# Patient Record
Sex: Male | Born: 1945
Health system: Southern US, Community
[De-identification: ages and names within clinical notes are randomized; demographics above are authoritative.]

## PROBLEM LIST (undated history)

## (undated) ENCOUNTER — Emergency Department (HOSPITAL_COMMUNITY): Discharge status: Against Medical Advice | Payer: BC Managed Care – PPO

## (undated) DIAGNOSIS — F32A Depression, unspecified: Secondary | ICD-10-CM

## (undated) DIAGNOSIS — E1165 Type 2 diabetes mellitus with hyperglycemia: Secondary | ICD-10-CM

## (undated) DIAGNOSIS — E785 Hyperlipidemia, unspecified: Secondary | ICD-10-CM

## (undated) DIAGNOSIS — E1110 Type 2 diabetes mellitus with ketoacidosis without coma: Secondary | ICD-10-CM

## (undated) DIAGNOSIS — IMO0002 Reserved for concepts with insufficient information to code with codable children: Secondary | ICD-10-CM

## (undated) DIAGNOSIS — I00 Rheumatic fever without heart involvement: Secondary | ICD-10-CM

## (undated) DIAGNOSIS — Z8489 Family history of other specified conditions: Secondary | ICD-10-CM

## (undated) DIAGNOSIS — R310 Gross hematuria: Secondary | ICD-10-CM

## (undated) DIAGNOSIS — F329 Major depressive disorder, single episode, unspecified: Secondary | ICD-10-CM

## (undated) DIAGNOSIS — Z8709 Personal history of other diseases of the respiratory system: Secondary | ICD-10-CM

## (undated) DIAGNOSIS — G3184 Mild cognitive impairment, so stated: Secondary | ICD-10-CM

## (undated) DIAGNOSIS — F419 Anxiety disorder, unspecified: Secondary | ICD-10-CM

## (undated) DIAGNOSIS — N529 Male erectile dysfunction, unspecified: Secondary | ICD-10-CM

## (undated) DIAGNOSIS — M47812 Spondylosis without myelopathy or radiculopathy, cervical region: Secondary | ICD-10-CM

## (undated) DIAGNOSIS — E113299 Type 2 diabetes mellitus with mild nonproliferative diabetic retinopathy without macular edema, unspecified eye: Secondary | ICD-10-CM

## (undated) DIAGNOSIS — N138 Other obstructive and reflux uropathy: Secondary | ICD-10-CM

## (undated) DIAGNOSIS — H6690 Otitis media, unspecified, unspecified ear: Secondary | ICD-10-CM

## (undated) DIAGNOSIS — K2270 Barrett's esophagus without dysplasia: Secondary | ICD-10-CM

## (undated) DIAGNOSIS — M1990 Unspecified osteoarthritis, unspecified site: Secondary | ICD-10-CM

## (undated) DIAGNOSIS — K219 Gastro-esophageal reflux disease without esophagitis: Secondary | ICD-10-CM

## (undated) DIAGNOSIS — N3289 Other specified disorders of bladder: Secondary | ICD-10-CM

## (undated) DIAGNOSIS — I10 Essential (primary) hypertension: Secondary | ICD-10-CM

## (undated) DIAGNOSIS — N401 Enlarged prostate with lower urinary tract symptoms: Secondary | ICD-10-CM

## (undated) DIAGNOSIS — E118 Type 2 diabetes mellitus with unspecified complications: Secondary | ICD-10-CM

## (undated) HISTORY — DX: Barrett's esophagus without dysplasia: K22.70

## (undated) HISTORY — DX: Otitis media, unspecified, unspecified ear: H66.90

## (undated) HISTORY — DX: Essential (primary) hypertension: I10

## (undated) HISTORY — PX: APPENDECTOMY: SHX54

## (undated) HISTORY — DX: Type 2 diabetes mellitus with unspecified complications: E11.8

## (undated) HISTORY — DX: Rheumatic fever without heart involvement: I00

## (undated) HISTORY — DX: Hyperlipidemia, unspecified: E78.5

## (undated) HISTORY — DX: Benign prostatic hyperplasia with lower urinary tract symptoms: N40.1

## (undated) HISTORY — PX: CHOLECYSTECTOMY: SHX55

## (undated) HISTORY — DX: Gastro-esophageal reflux disease without esophagitis: K21.9

## (undated) HISTORY — DX: Unspecified osteoarthritis, unspecified site: M19.90

## (undated) HISTORY — PX: VASECTOMY: SHX75

## (undated) HISTORY — PX: WISDOM TOOTH EXTRACTION: SHX21

## (undated) HISTORY — DX: Type 2 diabetes mellitus with mild nonproliferative diabetic retinopathy without macular edema, unspecified eye: E11.65

## (undated) HISTORY — DX: Reserved for concepts with insufficient information to code with codable children: IMO0002

## (undated) HISTORY — DX: Type 2 diabetes mellitus with ketoacidosis without coma: E11.10

## (undated) HISTORY — DX: Type 2 diabetes mellitus with mild nonproliferative diabetic retinopathy without macular edema, unspecified eye: E11.3299

## (undated) HISTORY — DX: Other obstructive and reflux uropathy: N13.8

## (undated) HISTORY — DX: Anxiety disorder, unspecified: F41.9

---

## 1999-11-21 ENCOUNTER — Encounter: Payer: Self-pay | Admitting: *Deleted

## 1999-11-21 ENCOUNTER — Encounter: Admission: RE | Admit: 1999-11-21 | Discharge: 1999-11-21 | Payer: Self-pay | Admitting: *Deleted

## 2002-05-08 ENCOUNTER — Encounter (INDEPENDENT_AMBULATORY_CARE_PROVIDER_SITE_OTHER): Payer: Self-pay | Admitting: *Deleted

## 2002-05-08 ENCOUNTER — Ambulatory Visit (HOSPITAL_COMMUNITY): Admission: RE | Admit: 2002-05-08 | Discharge: 2002-05-08 | Payer: Self-pay | Admitting: Gastroenterology

## 2002-09-16 ENCOUNTER — Encounter: Admission: RE | Admit: 2002-09-16 | Discharge: 2002-09-16 | Payer: Self-pay | Admitting: Internal Medicine

## 2002-09-16 ENCOUNTER — Encounter: Payer: Self-pay | Admitting: Internal Medicine

## 2002-09-25 ENCOUNTER — Encounter: Admission: RE | Admit: 2002-09-25 | Discharge: 2002-12-24 | Payer: Self-pay | Admitting: Internal Medicine

## 2002-10-27 ENCOUNTER — Encounter: Payer: Self-pay | Admitting: Internal Medicine

## 2002-10-27 ENCOUNTER — Encounter: Admission: RE | Admit: 2002-10-27 | Discharge: 2002-10-27 | Payer: Self-pay | Admitting: Internal Medicine

## 2002-12-26 ENCOUNTER — Encounter: Payer: Self-pay | Admitting: Neurosurgery

## 2002-12-26 ENCOUNTER — Encounter: Admission: RE | Admit: 2002-12-26 | Discharge: 2002-12-26 | Payer: Self-pay | Admitting: Neurosurgery

## 2002-12-26 ENCOUNTER — Encounter: Payer: Self-pay | Admitting: Radiology

## 2002-12-30 ENCOUNTER — Encounter: Admission: RE | Admit: 2002-12-30 | Discharge: 2003-03-30 | Payer: Self-pay | Admitting: Internal Medicine

## 2003-01-16 ENCOUNTER — Encounter: Payer: Self-pay | Admitting: Neurosurgery

## 2003-01-16 ENCOUNTER — Encounter: Admission: RE | Admit: 2003-01-16 | Discharge: 2003-01-16 | Payer: Self-pay | Admitting: Neurosurgery

## 2003-02-02 ENCOUNTER — Encounter: Payer: Self-pay | Admitting: Neurosurgery

## 2003-02-02 ENCOUNTER — Encounter: Admission: RE | Admit: 2003-02-02 | Discharge: 2003-02-02 | Payer: Self-pay | Admitting: Neurosurgery

## 2006-11-30 ENCOUNTER — Ambulatory Visit: Payer: Self-pay | Admitting: Internal Medicine

## 2007-03-11 ENCOUNTER — Ambulatory Visit: Payer: Self-pay | Admitting: Internal Medicine

## 2007-03-20 ENCOUNTER — Ambulatory Visit: Payer: Self-pay | Admitting: Internal Medicine

## 2007-03-25 LAB — CONVERTED CEMR LAB
BUN: 15 mg/dL (ref 6–23)
Calcium: 8.6 mg/dL (ref 8.4–10.5)
Cholesterol: 170 mg/dL (ref 0–200)
Creatinine, Ser: 0.8 mg/dL (ref 0.4–1.5)
HDL: 27.4 mg/dL — ABNORMAL LOW (ref >39.0)
Sodium: 143 meq/L (ref 135–145)

## 2007-04-05 ENCOUNTER — Ambulatory Visit: Payer: Self-pay | Admitting: Internal Medicine

## 2007-05-08 ENCOUNTER — Ambulatory Visit: Payer: Self-pay | Admitting: Internal Medicine

## 2007-05-08 LAB — CONVERTED CEMR LAB
Cholesterol: 103 mg/dL (ref 0–200)
Total CHOL/HDL Ratio: 4.3

## 2007-07-12 ENCOUNTER — Encounter: Payer: Self-pay | Admitting: Internal Medicine

## 2007-07-24 ENCOUNTER — Encounter: Payer: Self-pay | Admitting: Internal Medicine

## 2007-07-24 DIAGNOSIS — Z9189 Other specified personal risk factors, not elsewhere classified: Secondary | ICD-10-CM | POA: Insufficient documentation

## 2007-07-24 DIAGNOSIS — K219 Gastro-esophageal reflux disease without esophagitis: Secondary | ICD-10-CM | POA: Insufficient documentation

## 2007-07-24 DIAGNOSIS — F411 Generalized anxiety disorder: Secondary | ICD-10-CM | POA: Insufficient documentation

## 2007-07-24 DIAGNOSIS — H6690 Otitis media, unspecified, unspecified ear: Secondary | ICD-10-CM | POA: Insufficient documentation

## 2007-07-24 DIAGNOSIS — Z8679 Personal history of other diseases of the circulatory system: Secondary | ICD-10-CM | POA: Insufficient documentation

## 2007-07-24 DIAGNOSIS — I10 Essential (primary) hypertension: Secondary | ICD-10-CM | POA: Insufficient documentation

## 2007-07-24 DIAGNOSIS — E113299 Type 2 diabetes mellitus with mild nonproliferative diabetic retinopathy without macular edema, unspecified eye: Secondary | ICD-10-CM | POA: Insufficient documentation

## 2007-07-24 DIAGNOSIS — E119 Type 2 diabetes mellitus without complications: Secondary | ICD-10-CM | POA: Insufficient documentation

## 2007-07-26 ENCOUNTER — Ambulatory Visit: Payer: Self-pay | Admitting: Internal Medicine

## 2007-07-26 LAB — CONVERTED CEMR LAB
BUN: 21 mg/dL (ref 6–23)
Chloride: 102 meq/L (ref 96–112)
GFR calc Af Amer: 111 mL/min
GFR calc non Af Amer: 92 mL/min
Glucose, Bld: 291 mg/dL — ABNORMAL HIGH (ref 70–99)
Hgb A1c MFr Bld: 11.2 % — ABNORMAL HIGH (ref 4.6–6.0)

## 2007-07-28 ENCOUNTER — Encounter: Payer: Self-pay | Admitting: Internal Medicine

## 2007-08-09 ENCOUNTER — Ambulatory Visit (HOSPITAL_BASED_OUTPATIENT_CLINIC_OR_DEPARTMENT_OTHER): Admission: RE | Admit: 2007-08-09 | Discharge: 2007-08-09 | Payer: Self-pay | Admitting: Urology

## 2007-08-19 ENCOUNTER — Ambulatory Visit: Payer: Self-pay | Admitting: Internal Medicine

## 2007-08-27 ENCOUNTER — Telehealth: Payer: Self-pay | Admitting: Internal Medicine

## 2007-09-02 ENCOUNTER — Ambulatory Visit: Payer: Self-pay | Admitting: Internal Medicine

## 2007-10-14 ENCOUNTER — Telehealth: Payer: Self-pay | Admitting: Internal Medicine

## 2008-02-21 ENCOUNTER — Telehealth: Payer: Self-pay | Admitting: Internal Medicine

## 2008-02-25 ENCOUNTER — Telehealth: Payer: Self-pay | Admitting: Internal Medicine

## 2008-03-07 ENCOUNTER — Encounter: Payer: Self-pay | Admitting: Internal Medicine

## 2008-06-02 ENCOUNTER — Encounter: Payer: Self-pay | Admitting: Internal Medicine

## 2008-06-11 ENCOUNTER — Encounter: Payer: Self-pay | Admitting: Internal Medicine

## 2008-09-28 ENCOUNTER — Telehealth: Payer: Self-pay | Admitting: Internal Medicine

## 2008-10-12 ENCOUNTER — Telehealth: Payer: Self-pay | Admitting: Internal Medicine

## 2008-11-19 ENCOUNTER — Ambulatory Visit: Payer: Self-pay | Admitting: Internal Medicine

## 2008-11-19 DIAGNOSIS — M79609 Pain in unspecified limb: Secondary | ICD-10-CM | POA: Insufficient documentation

## 2008-11-24 ENCOUNTER — Encounter: Payer: Self-pay | Admitting: Internal Medicine

## 2008-11-24 ENCOUNTER — Ambulatory Visit: Payer: Self-pay

## 2008-12-07 ENCOUNTER — Encounter: Payer: Self-pay | Admitting: Internal Medicine

## 2008-12-14 ENCOUNTER — Ambulatory Visit: Payer: Self-pay | Admitting: Internal Medicine

## 2008-12-14 LAB — CONVERTED CEMR LAB
Basophils Absolute: 0 10*3/uL (ref 0.0–0.1)
Eosinophils Absolute: 0.3 10*3/uL (ref 0.0–0.7)
HCT: 44.6 % (ref 39.0–52.0)
Hemoglobin: 15.5 g/dL (ref 13.0–17.0)
Lymphocytes Relative: 17.6 % (ref 12.0–46.0)
Lymphs Abs: 1.6 10*3/uL (ref 0.7–4.0)
MCHC: 34.7 g/dL (ref 30.0–36.0)
MCV: 88.6 fL (ref 78.0–100.0)
Monocytes Absolute: 0.6 10*3/uL (ref 0.1–1.0)
Monocytes Relative: 6.3 % (ref 3.0–12.0)
Neutro Abs: 6.4 10*3/uL (ref 1.4–7.7)
RBC: 5.03 M/uL (ref 4.22–5.81)
RDW: 12.6 % (ref 11.5–14.6)
Sodium: 145 meq/L (ref 135–145)
WBC: 8.9 10*3/uL (ref 4.5–10.5)

## 2008-12-20 ENCOUNTER — Encounter: Payer: Self-pay | Admitting: Internal Medicine

## 2008-12-29 ENCOUNTER — Ambulatory Visit: Payer: Self-pay | Admitting: Gastroenterology

## 2008-12-29 DIAGNOSIS — E785 Hyperlipidemia, unspecified: Secondary | ICD-10-CM | POA: Insufficient documentation

## 2008-12-29 DIAGNOSIS — E1169 Type 2 diabetes mellitus with other specified complication: Secondary | ICD-10-CM | POA: Insufficient documentation

## 2008-12-29 DIAGNOSIS — K921 Melena: Secondary | ICD-10-CM | POA: Insufficient documentation

## 2008-12-31 ENCOUNTER — Ambulatory Visit: Payer: Self-pay | Admitting: Internal Medicine

## 2008-12-31 LAB — CONVERTED CEMR LAB
HDL: 27.1 mg/dL — ABNORMAL LOW (ref >39.00)
Total CHOL/HDL Ratio: 4
Triglycerides: 99 mg/dL (ref 0.0–149.0)
VLDL: 19.8 mg/dL (ref 0.0–40.0)

## 2009-01-03 ENCOUNTER — Encounter: Payer: Self-pay | Admitting: Internal Medicine

## 2009-01-26 HISTORY — PX: ESOPHAGOGASTRODUODENOSCOPY: SHX1529

## 2009-01-26 HISTORY — PX: COLONOSCOPY: SHX174

## 2009-02-01 ENCOUNTER — Encounter: Payer: Self-pay | Admitting: Internal Medicine

## 2009-02-01 ENCOUNTER — Ambulatory Visit: Payer: Self-pay | Admitting: Gastroenterology

## 2009-02-01 ENCOUNTER — Encounter: Payer: Self-pay | Admitting: Gastroenterology

## 2009-02-02 ENCOUNTER — Encounter: Payer: Self-pay | Admitting: Gastroenterology

## 2009-06-16 ENCOUNTER — Telehealth: Payer: Self-pay | Admitting: Internal Medicine

## 2010-01-25 ENCOUNTER — Telehealth (INDEPENDENT_AMBULATORY_CARE_PROVIDER_SITE_OTHER): Payer: Self-pay | Admitting: *Deleted

## 2010-01-26 ENCOUNTER — Ambulatory Visit: Payer: Self-pay | Admitting: Internal Medicine

## 2010-01-26 DIAGNOSIS — E1110 Type 2 diabetes mellitus with ketoacidosis without coma: Secondary | ICD-10-CM

## 2010-01-26 HISTORY — DX: Type 2 diabetes mellitus with ketoacidosis without coma: E11.10

## 2010-01-26 LAB — CONVERTED CEMR LAB
BUN: 12 mg/dL (ref 6–23)
CO2: 30 meq/L (ref 19–32)
Calcium: 8.9 mg/dL (ref 8.4–10.5)
GFR calc non Af Amer: 107.33 mL/min (ref >60)

## 2010-01-27 ENCOUNTER — Ambulatory Visit: Payer: Self-pay | Admitting: Internal Medicine

## 2010-01-29 ENCOUNTER — Inpatient Hospital Stay (HOSPITAL_COMMUNITY): Admission: EM | Admit: 2010-01-29 | Discharge: 2010-02-01 | Payer: Self-pay | Admitting: Emergency Medicine

## 2010-01-29 ENCOUNTER — Ambulatory Visit: Payer: Self-pay | Admitting: Internal Medicine

## 2010-01-29 ENCOUNTER — Ambulatory Visit: Payer: Self-pay | Admitting: Critical Care Medicine

## 2010-02-03 ENCOUNTER — Ambulatory Visit: Payer: Self-pay | Admitting: Internal Medicine

## 2010-02-03 ENCOUNTER — Telehealth: Payer: Self-pay | Admitting: Internal Medicine

## 2010-02-03 LAB — CONVERTED CEMR LAB
Glucose, Bld: 300 mg/dL
WBC Urine, dipstick: NEGATIVE

## 2010-02-04 ENCOUNTER — Telehealth: Payer: Self-pay | Admitting: Internal Medicine

## 2010-02-04 ENCOUNTER — Ambulatory Visit: Payer: Self-pay | Admitting: Internal Medicine

## 2010-02-04 LAB — CONVERTED CEMR LAB
Bilirubin Urine: NEGATIVE
CO2: 31 meq/L (ref 19–32)
Chloride: 108 meq/L (ref 96–112)
Eosinophils Absolute: 0.2 10*3/uL (ref 0.0–0.7)
Eosinophils Relative: 2.4 % (ref 0.0–5.0)
Hemoglobin: 12.8 g/dL — ABNORMAL LOW (ref 13.0–17.0)
Lymphs Abs: 1.4 10*3/uL (ref 0.7–4.0)
Monocytes Absolute: 0.5 10*3/uL (ref 0.1–1.0)
Monocytes Relative: 5.2 % (ref 3.0–12.0)
Neutrophils Relative %: 76.2 % (ref 43.0–77.0)
Platelets: 213 10*3/uL (ref 150.0–400.0)
RDW: 13.1 % (ref 11.5–14.6)
Specific Gravity, Urine: 1.015 (ref 1.000–1.030)
Total Protein, Urine: NEGATIVE mg/dL
Urobilinogen, UA: 0.2 (ref 0.0–1.0)
pH: 7 (ref 5.0–8.0)

## 2010-02-07 ENCOUNTER — Telehealth: Payer: Self-pay | Admitting: Internal Medicine

## 2010-02-25 ENCOUNTER — Telehealth: Payer: Self-pay | Admitting: Internal Medicine

## 2010-03-10 ENCOUNTER — Ambulatory Visit: Payer: Self-pay | Admitting: Internal Medicine

## 2010-05-09 ENCOUNTER — Ambulatory Visit: Payer: Self-pay | Admitting: Internal Medicine

## 2010-05-09 LAB — CONVERTED CEMR LAB
ALT: 12 units/L (ref 0–53)
AST: 16 units/L (ref 0–37)
Albumin: 3.9 g/dL (ref 3.5–5.2)
Bilirubin, Direct: 0.2 mg/dL (ref 0.0–0.3)
Calcium: 9.1 mg/dL (ref 8.4–10.5)
Cholesterol: 121 mg/dL (ref 0–200)
Creatinine, Ser: 0.7 mg/dL (ref 0.4–1.5)
Glucose, Bld: 130 mg/dL — ABNORMAL HIGH (ref 70–99)
HDL: 28.6 mg/dL — ABNORMAL LOW (ref >39.00)
LDL Cholesterol: 76 mg/dL (ref 0–99)
Triglycerides: 83 mg/dL (ref 0.0–149.0)
VLDL: 16.6 mg/dL (ref 0.0–40.0)

## 2010-05-27 ENCOUNTER — Telehealth: Payer: Self-pay | Admitting: Internal Medicine

## 2010-06-02 ENCOUNTER — Ambulatory Visit: Payer: Self-pay | Admitting: Internal Medicine

## 2010-06-02 DIAGNOSIS — N63 Unspecified lump in unspecified breast: Secondary | ICD-10-CM | POA: Insufficient documentation

## 2010-06-07 ENCOUNTER — Telehealth: Payer: Self-pay | Admitting: Internal Medicine

## 2010-08-09 ENCOUNTER — Ambulatory Visit: Payer: Self-pay | Admitting: Internal Medicine

## 2010-08-11 ENCOUNTER — Ambulatory Visit: Payer: Self-pay | Admitting: Internal Medicine

## 2010-08-15 ENCOUNTER — Telehealth: Payer: Self-pay | Admitting: Internal Medicine

## 2010-08-15 LAB — CONVERTED CEMR LAB
Chloride: 104 meq/L (ref 96–112)
Creatinine, Ser: 0.7 mg/dL (ref 0.4–1.5)
GFR calc non Af Amer: 115.65 mL/min (ref >60.00)
Glucose, Bld: 162 mg/dL — ABNORMAL HIGH (ref 70–99)
HDL: 30.2 mg/dL — ABNORMAL LOW (ref >39.00)
Sodium: 140 meq/L (ref 135–145)
VLDL: 13.6 mg/dL (ref 0.0–40.0)

## 2010-09-09 ENCOUNTER — Telehealth: Payer: Self-pay | Admitting: Internal Medicine

## 2010-09-29 NOTE — Progress Notes (Signed)
Summary: RESULTS - OV  ---- Converted from flag ---- ---- 05/23/2010 8:43 AM, Jacques Navy MD wrote: Needs follow-up OV. Cholesterol panel is good . A1C 7.9% ------------------------------  Phone Note Call from Patient   Summary of Call: Pt returned ami's call. Ret call at [phone] or home#.  Initial call taken by: Lamar Sprinkles, CMA,  May 27, 2010 10:16 AM  Follow-up for Phone Call        [place] over results with pt and sch pt for 3 month follow up on Jun 02, 2010 at 1pm Follow-up by: Ami Bullins CMA,  May 27, 2010 11:25 AM

## 2010-09-29 NOTE — Progress Notes (Signed)
  Phone Note Outgoing Call   Reason for Call: Discuss lab or test results Summary of Call: called lab results. He is to increase novolog before meals to  06-13-18. He is to call in a report in a month. Initial call taken by: Jacques Navy MD,  August 15, 2010 10:28 AM    New/Updated Medications: NOVOLOG FLEXPEN 100 UNIT/ML  SOLN (INSULIN ASPART) 06-13-18

## 2010-09-29 NOTE — Assessment & Plan Note (Signed)
Summary: medication adjustment/#/cd   Vital Signs:  Patient profile:   66 year old male Height:      67 inches Weight:      206 pounds BMI:     32.38 O2 Sat:      97 % on Room air Temp:     97.9 degrees F oral Pulse rate:   88 / minute BP sitting:   126 / 78  (left arm) Cuff size:   regular  Vitals Entered By: Bill Salinas CMA (March 10, 2010 1:04 PM)  O2 Flow:  Room air CC: pt here for ov to discuss CBGs/ his blood sugars tend to be running high in the evening/ ab   Primary Care Provider:  Illene Regulus, MD  CC:  pt here for ov to discuss CBGs/ his blood sugars tend to be running high in the evening/ ab.  History of Present Illness: Patient returns for follow-up of IDDM. He is on levemir and novolog before meals. He brings his records with him. Fasting CBG reflect adequate control  with values between 80-160. Post-prandial readings indicate need to increase before meal insulin dose. He has not had any hypoglycemic episode.   Current Medications (verified): 1)  Hyzaar 100-25 Mg  Tabs (Losartan Potassium-Hctz) .[name] Kitchen.. 1 By Mouth Once Daily 2)  Xanax 0.5 Mg  Tabs (Alprazolam) .[name] Kitchen.. 1 Once Daily As Needed 3)  Viagra 100 Mg  Tabs (Sildenafil Citrate) .... As Needed 4)  Simvastatin 20 Mg  Tabs (Simvastatin) .[name] Kitchen.. 1 Every Pm 5)  Levemir Flexpen 100 Unit/ml  Soln (Insulin Detemir) .... 55 Units At Bedtime 6)  Novolog Flexpen 100 Unit/ml  Soln (Insulin Aspart) .... As Directed Per Sliding Scale 7)  Bd Insulin Syringe Ultrafine 31g X 5/16" 0.3 Ml Misc (Insulin Syringe-Needle U-100) .... Use As Directed 8)  Protonix 40 Mg Tbec (Pantoprazole Sodium) .... Take 1 By Mouth Once Daily 9)  Promethazine-Codeine 6.25-10 Mg/59ml Syrp (Promethazine-Codeine) .[name] Kitchen.. 1 Tsp Every 6 Hrs As Needed Cough 10)  Onetouch Test  Strp (Glucose Blood) .... Use One Strip Two Times A Day 11)  Lotrisone 1-0.05 % Crea (Clotrimazole-Betamethasone) .... As Needed 12)  Rapaflo 8 Mg Caps (Silodosin) .[name] Kitchen.. 1 By Mouth Once  Daily  Allergies (verified): No Known Drug Allergies  Past History:  Past Medical History: Last updated: 02/03/2010 Diabetes mellitus, type II GERD Hypertension Anxiety RHEUMATIC FEVER, HX OF (ICD-V12.59) OTITIS MEDIA, BILATERAL (ICD-382.9) DKA June '11   Physician Roster:        GIJosefa Half        GS - Dr. Maple [name]        NS- Dr. 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 Is thinking of fully retiring (July '11) Married 1969 2 daughters 1972, 1976; 1 son 19 7 grandchildren Hobby: car restoration: has '66 Vette, two classic Fae Pippin and is restoring a '93 Chevy coup Patient currently smokes. -4 cigarrettes daily Alcohol Use - no Daily Caffeine Use-2 drinks daily Illicit Drug Use - no Patient does not get regular exercise.   Review of Systems  The patient denies anorexia, weight loss, weight gain, chest pain, syncope, and abdominal pain.    Physical Exam  General:  Well-developed,well-nourished,in no acute distress; alert,appropriate and cooperative throughout examination Eyes:  C&S clear Lungs:  normal respiratory effort and normal breath sounds.   Heart:  normal rate and regular rhythm.  Impression & Recommendations:  Problem # 1:  DIABETES MELLITUS, TYPE II (ICD-250.00) Better control but not at goal. He is trying to follow a diabetic diet.  Plan continue present dose of Levemir         Increase Novolog to 06-08-13         Return in 3 months.   His updated medication list for this problem includes:    Hyzaar 100-25 Mg Tabs (Losartan potassium-hctz) .[name] Kitchen... 1 by mouth once daily    Levemir Flexpen 100 Unit/ml Soln (Insulin detemir) .[name] KitchenMarland KitchenMarland KitchenMarland Kitchen 55 units at bedtime    Novolog Flexpen 100 Unit/ml Soln (Insulin aspart) .[name] KitchenMarland KitchenMarland KitchenMarland Kitchen 06-08-13  Complete Medication List: 1)  Hyzaar 100-25 Mg Tabs  (Losartan potassium-hctz) .[name] Kitchen.. 1 by mouth once daily 2)  Xanax 0.5 Mg Tabs (Alprazolam) .[name] Kitchen.. 1 once daily as needed 3)  Viagra 100 Mg Tabs (Sildenafil citrate) .... As needed 4)  Simvastatin 20 Mg Tabs (Simvastatin) .[name] Kitchen.. 1 every pm 5)  Levemir Flexpen 100 Unit/ml Soln (Insulin detemir) .... 55 units at bedtime 6)  Novolog Flexpen 100 Unit/ml Soln (Insulin aspart) .[name] Kitchen.. 06-08-13 7)  Bd Insulin Syringe Ultrafine 31g X 5/16" 0.3 Ml Misc (Insulin syringe-needle u-100) .... Use as directed 8)  Protonix 40 Mg Tbec (Pantoprazole sodium) .... Take 1 by mouth once daily 9)  Promethazine-codeine 6.25-10 Mg/73ml Syrp (Promethazine-codeine) .[name] Kitchen.. 1 tsp every 6 hrs as needed cough 10)  Onetouch Test Strp (Glucose blood) .... Use one strip two times a day 11)  Lotrisone 1-0.05 % Crea (Clotrimazole-betamethasone) .... As needed 12)  Rapaflo 8 Mg Caps (Silodosin) .[name] Kitchen.. 1 by mouth once daily   Immunization History:  Influenza Immunization History:    Influenza:  historical (06/02/2009)

## 2010-09-29 NOTE — Progress Notes (Signed)
Summary: UPDATE  Phone Note Outgoing Call   Reason for Call: Discuss lab or test results Summary of Call: please call patient 1. labs this am are normal including glucose of 108 and normal kidney function and CBC 2. Did rapaflo help last night.  Initial call taken by: Jacques Navy MD,  February 04, 2010 1:12 PM  Follow-up for Phone Call        1.Patient says he "dribbles" less and is able to get a good urine flow. Does he take med once daily? Morning or evening? Pt does not have rx, does he need f/u?   2. Today at lunch sliding scale had pt taking only 2 units. He takes the insulin before he eats. He is confused that the insulin will not "off-set" the food he eats. Pt does continue to have higher cbgs in the early evenings.   3. Legs are no better, still swollen. Will some exercise help? Any further advice?   4. Prior to hospital admit pt was given antibiotic for ear infection. He did not take this. Ear continues to drain slightly. It is better than the last two days but still has ringing.   Follow-up by: Lamar Sprinkles, CMA,  February 04, 2010 5:50 PM  Additional Follow-up for Phone Call Additional follow up Details #1::        called patient; 1) keep CBG record before meals and 90-120 minutes post-prandial. Also record the units of insulin before meals. Fax to me 2) continue hyzaar. elevate legs 3) take antibiotic for ear infection Additional Follow-up by: Jacques Navy MD,  February 04, 2010 6:36 PM

## 2010-09-29 NOTE — Progress Notes (Signed)
Summary: REQ FOR RX  Phone Note Call from Patient Call back at [phone]   Summary of Call: Pt c/o "severe chest cold" and is req rx from MD. Pt c/o chest & sinus congestion, cough that is slightly productive and has bad taste. He is using Robitussin in the am and otc store brand multi symptom med at bedtime w/little relief.   *** Patient thinks that he may have allergy to prometh/codiene syrup. Says last dose he took he woke up and felt as he could not breath. He is unsure if he was sleepy and took too much of a dose but does not want anymore codiene.  Initial call taken by: Lamar Sprinkles, CMA,  June 07, 2010 11:10 AM  Follow-up for Phone Call        1) sugar free robitussin DM 2) z-pak for thick, purulent sounding mucus 3) hydrate Follow-up by: Jacques Navy MD,  June 07, 2010 12:55 PM  Additional Follow-up for Phone Call Additional follow up Details #1::        Pt informed  Additional Follow-up by: Lamar Sprinkles, CMA,  June 07, 2010 3:16 PM    New/Updated Medications: ZITHROMAX Z-PAK 250 MG TABS (AZITHROMYCIN) as directed Prescriptions: ZITHROMAX Z-PAK 250 MG TABS (AZITHROMYCIN) as directed  #1 x 0   Entered by:   Lamar Sprinkles, CMA   Authorized by:   Jacques Navy MD   Signed by:   Lamar Sprinkles, CMA on 06/07/2010   Method used:   Electronically to        [facility] (retail)       [address]       [place], [place]  [id number]       Ph: [phone]       Fax: [phone]   RxID:   [id number]

## 2010-09-29 NOTE — Assessment & Plan Note (Signed)
Summary: 3 month follow up   Vital Signs:  Patient profile:   65 year old male Height:      67 inches Weight:      208 pounds BMI:     32.70 O2 Sat:      97 % on Room air Temp:     97.3 degrees F oral Pulse rate:   86 / minute BP sitting:   138 / 88  (left arm) Cuff size:   regular  Vitals Entered By: Bill Salinas CMA (June 02, 2010 1:19 PM)  O2 Flow:  Room air CC: 3 month follow up on diabetes/ab   Primary Care Provider:  Illene Regulus, MD  CC:  3 month follow up on diabetes/ab.  History of Present Illness: Anthony [name] is a 65 y.o. caucasian male who presents to the clinic for a three month follow up from his last visit. His recent past medical history includes an ER visit in June for a blood sugar value over 1400. He spent five days in the hospital. During the past three months Anthony [name]'s blood sugar values have been widely varied, with a maximum value in the 400s at least twice. He is taking all of his perscribed medications as ordered, at the correct times and dosages. He denies vision loss, but has not been to an opthamologist in one year. He denies neuropathy of his hands and feet.  Anthony [name] is also concerned about a possible lump near on the side of his right chest, roughly four inches below the axilla.  Patient is also concerned about arthritis and wonders whether or not it is simply due to age or is a degenerative disorder.  Finally,  Anthony [name] voiced his interest in testosterone supplemental therapy. He admits to decreased libido and general fatigue.  Current Medications (verified): 1)  Hyzaar 100-25 Mg  Tabs (Losartan Potassium-Hctz) .[name] Kitchen.. 1 By Mouth Once Daily 2)  Xanax 0.5 Mg  Tabs (Alprazolam) .[name] Kitchen.. 1 Once Daily As Needed 3)  Viagra 100 Mg  Tabs (Sildenafil Citrate) .... As Needed 4)  Simvastatin 20 Mg  Tabs (Simvastatin) .[name] Kitchen.. 1 Every Pm 5)  Levemir Flexpen 100 Unit/ml  Soln (Insulin Detemir) .... 55 Units At Bedtime 6)  Novolog Flexpen 100 Unit/ml   Soln (Insulin Aspart) .[name] Kitchen.. 06-11-14 7)  Bd Insulin Syringe Ultrafine 31g X 5/16" 0.3 Ml Misc (Insulin Syringe-Needle U-100) .... Use As Directed 8)  Protonix 40 Mg Tbec (Pantoprazole Sodium) .... Take 1 By Mouth Once Daily 9)  Promethazine-Codeine 6.25-10 Mg/81ml Syrp (Promethazine-Codeine) .[name] Kitchen.. 1 Tsp Every 6 Hrs As Needed Cough 10)  Onetouch Test  Strp (Glucose Blood) .... Use One Strip Two Times A Day 11)  Lotrisone 1-0.05 % Crea (Clotrimazole-Betamethasone) .... As Needed 12)  Rapaflo 8 Mg Caps (Silodosin) .[name] Kitchen.. 1 By Mouth Once Daily  Allergies (verified): No Known Drug Allergies  Past History:  Past Medical History: Last updated: 02/03/2010 Diabetes mellitus, type II GERD Hypertension Anxiety RHEUMATIC FEVER, HX OF (ICD-V12.59) OTITIS MEDIA, BILATERAL (ICD-382.9) DKA June '11   Physician Roster:        GIJosefa Half        GS - Dr. Maple [name]        NS- Dr. Channing Mutters  Past Surgical History: Last updated: 07/24/2007 Vasectomy Cholecystectomy WISDOM TEETH EXTRACTION, HX OF (ICD-V15.9)  Family History: Last updated: 2009-01-16 father - died in 23's cancer unkown type, EtOH abuse mother- died Non-Hodgkins lymphoma, CAD 3 brothers - all with DM Neg- colon, lung cancer:  CVD Family History of Pancreatic Cancer: Maternal Uncle Family History of Diabetes: Brother, Aunt Family History of Heart Disease: Mother  Review of Systems  The patient denies fever, weight loss, weight gain, vision loss, decreased hearing, chest pain, syncope, dyspnea on exertion, peripheral edema, abdominal pain, melena, severe indigestion/heartburn, hematuria, incontinence, suspicious skin lesions, depression, and unusual weight change.    Physical Exam  General:  alert, well-nourished, appropriate dress, normal appearance, cooperative to examination, and good hygiene.   Head:  normocephalic.   Eyes:  vision grossly intact, pupils equal, pupils round, pupils reactive to light, and corneas and lenses clear.     Nose:  no external deformity.   Lungs:  normal respiratory effort, no intercostal retractions, no accessory muscle use, normal breath sounds, no dullness, no fremitus, no crackles, and no wheezes.   Heart:  normal rate and regular rhythm.   Pulses:  R radial normal and L radial normal.   Neurologic:  alert & oriented X3, sensation intact to light touch, and sensation intact to pinprick.    Diabetes Management Exam:    Foot Exam (with socks and/or shoes not present):       Sensory-Pinprick/Light touch:          Left medial foot (L-4): normal          Left dorsal foot (L-5): normal          Left lateral foot (S-1): normal          Right medial foot (L-4): normal          Right dorsal foot (L-5): normal          Right lateral foot (S-1): normal   Impression & Recommendations:  Problem # 1:  DIABETES MELLITUS, TYPE II (ICD-250.00)  Patient last had labs on 12SEP2011. He is scheduled for labs on or after 12DEC2011. He had been taking a fasting and post-meal glucose at almost every meal. He was advised to take a fasting glucose every morning, and alternate one other reading after meals.  Plan-  Fasting glucose in morning, Post-meal glucose reading one meal a day           Continue medication as indicated           HgB A1C on or after 12DEC2011           Testosterone, lipid, cholesterol tests at same visit for next follow up appointment  Educated Patient about high glucose levels and low glucose levels. Warned him of the potential dangers of having his glucose fall below 70.   His updated medication list for this problem includes:    Hyzaar 100-25 Mg Tabs (Losartan potassium-hctz) .[name] Kitchen... 1 by mouth once daily    Levemir Flexpen 100 Unit/ml Soln (Insulin detemir) .[name] KitchenMarland KitchenMarland KitchenMarland Kitchen 55 units at bedtime    Novolog Flexpen 100 Unit/ml Soln (Insulin aspart) .[name] KitchenMarland KitchenMarland KitchenMarland Kitchen 06-11-14  Labs Reviewed: Creat: 0.7 (05/09/2010)    Reviewed HgBA1c results: 7.9 (05/09/2010)  7.7 (01/26/2010)  Problem # 2:  BREAST MASS  (ICD-611.72) Anthony [name] concern about a lump in his right chest/breast, was not in fact a lump, simply his pectoralis major muscle tendon.  Educated the patient about where the axillary lymph nodes are located.  Complete Medication List: 1)  Hyzaar 100-25 Mg Tabs (Losartan potassium-hctz) .[name] Kitchen.. 1 by mouth once daily 2)  Xanax 0.5 Mg Tabs (Alprazolam) .[name] Kitchen.. 1 once daily as needed 3)  Viagra 100 Mg Tabs (Sildenafil citrate) .... As needed 4)  Simvastatin 20 Mg Tabs (Simvastatin) .[name] KitchenMarland KitchenMarland Kitchen  1 every pm 5)  Levemir Flexpen 100 Unit/ml Soln (Insulin detemir) .... 55 units at bedtime 6)  Novolog Flexpen 100 Unit/ml Soln (Insulin aspart) .[name] Kitchen.. 06-11-14 7)  Bd Insulin Syringe Ultrafine 31g X 5/16" 0.3 Ml Misc (Insulin syringe-needle u-100) .... Use as directed 8)  Protonix 40 Mg Tbec (Pantoprazole sodium) .... Take 1 by mouth once daily 9)  Promethazine-codeine 6.25-10 Mg/43ml Syrp (Promethazine-codeine) .[name] Kitchen.. 1 tsp every 6 hrs as needed cough 10)  Onetouch Test Strp (Glucose blood) .... Use one strip two times a day 11)  Lotrisone 1-0.05 % Crea (Clotrimazole-betamethasone) .... As needed 12)  Rapaflo 8 Mg Caps (Silodosin) .[name] Kitchen.. 1 by mouth once daily  Other Orders: Admin 1st Vaccine ([address]) Flu Vaccine 1yrs + (60454)  Flu Vaccine Consent Questions     Do you have a history of severe allergic reactions to this vaccine? no    Any prior history of allergic reactions to egg and/or gelatin? no    Do you have a sensitivity to the preservative Thimersol? no    Do you have a past history of Guillan-Barre Syndrome? no    Do you currently have an acute febrile illness? no    Have you ever had a severe reaction to latex? no    Vaccine information given and explained to patient? yes    Are you currently pregnant? no    Lot Number:[id number]   Exp Date:02/25/2011   Site Given  Left Deltoid IMlbflu .... Lamar Sprinkles, CMA  June 02, 2010 4:59 PM

## 2010-09-29 NOTE — Progress Notes (Signed)
  Phone Note Call from Patient Call back at Home Phone [phone] Call back at cell [phone]   Caller: Patient Summary of Call: Patient clalled stating that he was given samples  of Rapaflo and was told to call for rx if it worked.  Patietnt  is [facility] Initial call taken by: Rock Nephew CMA,  February 25, 2010 10:28 AM  Follow-up for Phone Call        called patient - will call in rapaflo  He has been taking novolog 10 units at meals. He is instructed to take CBG after meals and keep a record.  Follow-up by: Jacques Navy MD,  February 25, 2010 5:14 PM    New/Updated Medications: RAPAFLO 8 MG CAPS (SILODOSIN) 1 by mouth once daily Prescriptions: RAPAFLO 8 MG CAPS (SILODOSIN) 1 by mouth once daily  #30 x 12   Entered and Authorized by:   Jacques Navy MD   Signed by:   Jacques Navy MD on 02/25/2010   Method used:   Electronically to        [facility] (retail)       [address]       [place], [place]  [id number]       Ph: [phone]       Fax: [phone]   RxID:   [id number]

## 2010-09-29 NOTE — Progress Notes (Signed)
Summary: RF  Phone Note Refill Request   Refills Requested: Medication #1:  XANAX 0.5 MG  TABS 1 once daily as needed [facility]  Initial call taken by: Lamar Sprinkles, CMA,  September 09, 2010 11:06 AM  Follow-up for Phone Call        ok for refill x 5 Follow-up by: Jacques Navy MD,  September 09, 2010 12:53 PM  Additional Follow-up for Phone Call Additional follow up Details #1::        Rx called to pharmacy Additional Follow-up by: Brenton Grills CMA Duncan Dull),  September 09, 2010 4:15 PM    Prescriptions: XANAX 0.5 MG  TABS (ALPRAZOLAM) 1 once daily as needed  #30 x 5   Entered by:   Brenton Grills CMA (AAMA)   Authorized by:   Jacques Navy MD   Signed by:   Brenton Grills CMA (AAMA) on 09/09/2010   Method used:   Telephoned to ...       [facility] (retail)       [address]       [place], [place]  [id number]       Ph: [phone]       Fax: [phone]   RxID:   [id number]

## 2010-09-29 NOTE — Progress Notes (Signed)
Summary: LABS  Phone Note Call from Patient Call back at [phone]   Summary of Call: Patient is requesting order for f/u a1c. He has office visit June 2nd. Ok for labs prior?  Initial call taken by: Lamar Sprinkles, CMA,  Jan 25, 2010 1:33 PM  Follow-up for Phone Call        ok for A1C, metabolic 250.00 Follow-up by: Jacques Navy MD,  Jan 25, 2010 6:02 PM  Additional Follow-up for Phone Call Additional follow up Details #1::        Left mess for pt, please put labs in idx for today, THANKS Additional Follow-up by: Lamar Sprinkles, CMA,  January 26, 2010 8:31 AM    Additional Follow-up for Phone Call Additional follow up Details #2::    labs done 01/26/10 Follow-up by: Verdell Face,  January 31, 2010 10:19 AM

## 2010-09-29 NOTE — Progress Notes (Signed)
  Phone Note Refill Request Message from:  Fax from Pharmacy on February 07, 2010 10:47 AM  Refills Requested: Medication #1:  XANAX 0.5 MG  TABS 1 once daily as needed   Last Refilled: 11/06/2009 Please advise refill  Initial call taken by: Ami Bullins CMA,  February 07, 2010 10:47 AM  Follow-up for Phone Call        OK to refill x 5.   Call patient - how is the blood sugar, how is the fluid retention? Follow-up by: Jacques Navy MD,  February 07, 2010 1:13 PM    Prescriptions: Anthony [name] 0.5 MG  TABS (ALPRAZOLAM) 1 once daily as needed  #30 x 5   Entered by:   Ami Bullins CMA   Authorized by:   Jacques Navy MD   Signed by:   Bill Salinas CMA on 02/07/2010   Method used:   Telephoned to ...       [facility] (retail)       [address]       [place], [place]  [id number]       Ph: [phone]       Fax: [phone]   RxID:   [id number]

## 2010-09-29 NOTE — Progress Notes (Signed)
Summary: Swelling issues  Phone Note Call from Patient Call back at Home Phone [phone] Call back at or [phone]   Caller: Patient Call For: Dr Debby Bud Summary of Call: Pt experiencing swelling in both arms and legs. Pt has problem with urgency to urinate but just dribbles.Please advise. Initial call taken by: Verdell Face,  February 03, 2010 11:01 AM  Follow-up for Phone Call        Patient left message on triage asking if something could be done via phone or if he should come in, please advise. Follow-up by: Lucious Groves,  February 03, 2010 11:18 AM  Additional Follow-up for Phone Call Additional follow up Details #1::        Just discharged from hospital with DKA, renal insufficiency and infection. HIs losartan/hct was on hold. Given how sick he was he can be added on tonight.  Additional Follow-up by: Jacques Navy MD,  February 03, 2010 4:04 PM    Additional Follow-up for Phone Call Additional follow up Details #2::    Patient notified and will come today. Follow-up by: Lucious Groves,  February 03, 2010 4:07 PM

## 2010-09-29 NOTE — Assessment & Plan Note (Signed)
Summary: CHEST CONGESTION  STC   Vital Signs:  Patient profile:   65 year old male Height:      67 inches (170.18 cm) Weight:      211.4 pounds (96.09 kg) BMI:     33.23 O2 Sat:      95 % on Room air Temp:     97.2 degrees F (36.22 degrees C) oral Pulse rate:   88 / minute BP sitting:   104 / 82  (left arm) Cuff size:   large  Vitals Entered By: Orlan Leavens (January 27, 2010 9:01 AM)  O2 Flow:  Room air CC: chest congestion Is Patient Diabetic? Yes Did you bring your meter with you today? No Pain Assessment Patient in pain? no        Primary Care Provider:  Illene Regulus, MD  CC:  chest congestion.  History of Present Illness: Feels he has bronchitis with productive cough with colored sputum. He has some pressure in the ears with pain in the right ear. No fever. Has had mild SOB. Secretions are inspissated.  Chart review: last CPX May '10.   DM- patient's last A1C 7.7% - previously 8%    Current Medications (verified): 1)  Omeprazole 40 Mg Cpdr (Omeprazole) .[name] Kitchen.. 1 By Mouth Qam 2)  Hyzaar 100-25 Mg  Tabs (Losartan Potassium-Hctz) .[name] Kitchen.. 1 By Mouth Once Daily 3)  Xanax 0.5 Mg  Tabs (Alprazolam) .[name] Kitchen.. 1 Once Daily As Needed 4)  Viagra 100 Mg  Tabs (Sildenafil Citrate) .... As Needed 5)  Simvastatin 20 Mg  Tabs (Simvastatin) .[name] Kitchen.. 1 Every Pm 6)  Levemir Flexpen 100 Unit/ml  Soln (Insulin Detemir) .... 55 Units At Bedtime 7)  Novolog Flexpen 100 Unit/ml  Soln (Insulin Aspart) .... As Directed Before Meals (16-22-22) 8)  Bd Insulin Syringe Ultrafine 31g X 5/16" 0.3 Ml Misc (Insulin Syringe-Needle U-100) .... Use As Directed 9)  Protonix 40 Mg Tbec (Pantoprazole Sodium) .... Take 1 By Mouth Once Daily  Allergies (verified): No Known Drug Allergies  Past History:  Past Medical History: Last updated: 07/26/2007 Diabetes mellitus, type II GERD Hypertension Anxiety RHEUMATIC FEVER, HX OF (ICD-V12.59) OTITIS MEDIA, BILATERAL (ICD-382.9)    Physician Roster:  GI- Josefa Half        GS - Dr. Maple [name]        NS- Dr. 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 Married 1969 2 daughters 1972, 31; 1 son 13 7 grandchildren Patient currently smokes. -4 cigarrettes daily Alcohol Use - no Daily Caffeine Use-2 drinks daily Illicit Drug Use - no Patient does not get regular exercise.   Risk Factors: Caffeine Use: 4 (07/26/2007) Exercise: no (2009/01/08)  Risk Factors: Smoking Status: current (2009-01-08) Packs/Day: 1/3 (07/26/2007) Passive Smoke Exposure: no (07/26/2007)  Review of Systems       The patient complains of weight gain.  The patient denies anorexia, fever, weight loss, decreased hearing, syncope, dyspnea on exertion, prolonged cough, headaches, abdominal pain, hematuria, muscle weakness, difficulty walking, depression, and enlarged lymph nodes.    Physical Exam  General:  alert, well-developed, and well-nourished.   Ears:  Right TM is inflammed with fluid behind the drum  Mouth:  posgterior pharynx clear Lungs:  normal respiratory effort, normal breath sounds, no crackles, and no wheezes.   Heart:  normal rate and regular rhythm.     Impression & Recommendations:  Problem # 1:  ACUTE BRONCHITIS (ICD-466.0) Patient with mild symptoms of early bronchitis.  Plan - Septra DS two times a day x 7 days           Prom/cod for cough  His updated medication list for this problem includes:    Sulfamethoxazole-tmp Ds  800-160 Mg Tabs (Sulfamethoxazole-trimethoprim) .[name] Kitchen... 1 by mouth two times a day x 7 days    Promethazine-codeine 6.25-10 Mg/33ml Syrp (Promethazine-codeine) .[name] Kitchen... 1 tsp q 6  Complete Medication List: 1)  Omeprazole 40 Mg Cpdr (Omeprazole) .[name] Kitchen.. 1 by mouth qam 2)  Hyzaar 100-25 Mg Tabs (Losartan potassium-hctz) .[name] Kitchen.. 1 by mouth once daily 3)  Xanax 0.5 Mg Tabs (Alprazolam) .[name] Kitchen.. 1 once daily as needed 4)  Viagra 100 Mg Tabs (Sildenafil citrate) .... As needed 5)  Simvastatin 20 Mg Tabs (Simvastatin) .[name] Kitchen.. 1 every pm 6)  Levemir Flexpen 100 Unit/ml Soln (Insulin detemir) .... 55 units at bedtime 7)  Novolog Flexpen 100 Unit/ml Soln (Insulin aspart) .... As directed before meals (16-22-22) 8)  Bd Insulin Syringe Ultrafine 31g X 5/16" 0.3 Ml Misc (Insulin syringe-needle u-100) .... Use as directed 9)  Protonix 40 Mg Tbec (Pantoprazole sodium) .... Take 1 by mouth once daily 10)  Sulfamethoxazole-tmp Ds 800-160 Mg Tabs (Sulfamethoxazole-trimethoprim) .[name] Kitchen.. 1 by mouth two times a day x 7 days 11)  Promethazine-codeine 6.25-10 Mg/22ml Syrp (Promethazine-codeine) .[name] Kitchen.. 1 tsp q 6  Patient Instructions: 1)  Upper respiratory infection - right Eardrum is red. Lungs are clear - Will treat with Septra DS two times a day x 7 days, promethazine with codein syrup 1 tsp every 6 hrs as needed for cough (watch for drowsiness, mucinex 2 tabs AM and PM to thin secretions, sudafed 30mg  (generic) two times a day for pressure in the ears. 2)  DM - A1C is above goal of 7% but below the threshold of 8% for making medication change. Plan - continue present medications but be careful with diet and try to walk every day. 3)  chest wall pain - costochondritis - inflammation of the joint between the ribs and the sternum. Plan - aspercreme to sore areas, OTC  NSAID as needed. This will also help with generalized arthritis, in addition to regular stretch exercise. 4)  Health Maintenance - need a full exam.   Prescriptions: PROMETHAZINE-CODEINE 6.25-10 MG/5ML SYRP (PROMETHAZINE-CODEINE) 1 tsp q 6  #8 oz x 0   Entered and Authorized by:   Jacques Navy MD   Signed by:   Jacques Navy MD on 01/27/2010   Method used:   Print then Give to Patient   RxID:   [id number] SULFAMETHOXAZOLE-TMP DS 800-160 MG TABS (SULFAMETHOXAZOLE-TRIMETHOPRIM) 1 by mouth two times a day x 7 days  #14 x 0   Entered and Authorized by:   Jacques Navy MD   Signed by:   Jacques Navy MD on 01/27/2010   Method used:   Print then Give to Patient   RxID:   [id number]

## 2010-09-29 NOTE — Assessment & Plan Note (Signed)
Summary: 500 PM APPT/SWELLING ISSUES/KB   Vital Signs:  Patient profile:   65 year old male Weight:      221 pounds BMI:     34.74 Temp:     98.4 degrees F Pulse rate:   75 / minute BP sitting:   170 / 90  (left arm) Cuff size:   large  Vitals Entered By: Lamar Sprinkles, CMA (February 03, 2010 5:39 PM) CC: Pt c/o swelling in arms and legs, started when d/c'd from hospital. Also c/o urinary urgency since d/c.  Comments Patient was supposed to have held hyzaar until 6/12. He took one dose of med today due to swelling but has noticed no change in symptoms.    Primary Care Provider:  Illene Regulus, MD  CC:  Pt c/o swelling in arms and legs and started when d/c'd from hospital. Also c/o urinary urgency since d/c. [name] Kitchen  History of Present Illness: Recently hospitalized for DKA, dehydration with acute renal failure. He was discharged 48 hours ago. presents today for urinary frequency, fluid retention and hypertension. He doesn't feel particularly sick. He has not had N/V. No respiatory symptoms and no chest pain.   Allergies (verified): No Known Drug Allergies  Past History:  Past Medical History: Diabetes mellitus, type II GERD Hypertension Anxiety RHEUMATIC FEVER, HX OF (ICD-V12.59) OTITIS MEDIA, BILATERAL (ICD-382.9) DKA June '11   Physician Roster:        GIJosefa Half        GS - Dr. Maple [name]        NS- Dr. Channing Mutters  Past Surgical History: Reviewed history from 07/24/2007 and no changes required. Vasectomy Cholecystectomy WISDOM TEETH EXTRACTION, HX OF (ICD-V15.9) PSH reviewed for relevance, FH reviewed for relevance  Review of Systems       The patient complains of weight loss and peripheral edema.  The patient denies anorexia, fever, decreased hearing, hoarseness, chest pain, syncope, dyspnea on exertion, headaches, abdominal pain, hematuria, muscle weakness, difficulty walking, abnormal bleeding, and enlarged lymph nodes.    Physical Exam  General:  overweight white male  in no distress but he had to use the bathroom x 3 during his visit. Head:  normocephalic and atraumatic.   Eyes:  corneas and lenses clear and no injection.   Chest Wall:  no deformities.   Lungs:  normal respiratory effort, normal breath sounds, no crackles, and no wheezes.   Heart:  normal rate and regular rhythm.   Abdomen:  soft, normal bowel sounds, and no guarding.   Pulses:  2+ radial Neurologic:  alert & oriented X3, cranial nerves II-XII intact, strength normal in all extremities, gait normal, and DTRs symmetrical and normal.     Impression & Recommendations:  Problem # 1:  DIABETES MELLITUS, TYPE II (ICD-250.00) Patient with recent DKA with presenting serum glucose of 1400+. He did well with hydration. He has been on levemir 54 u at bedtime and sliding scale coverage. He reports AM CBGs 50-80, CBGs AC go up during the day to 200-300 range. He has had urinary frequency. CBG in office 300. U/A large ketones, glucose >1000. He is not in aquite distress.  Plan - no change in meds - resume hyzaar           return in the Am for lab.   His updated medication list for this problem includes:    Hyzaar 100-25 Mg Tabs (Losartan potassium-hctz) .[name] Kitchen... 1 by mouth once daily    Levemir Flexpen 100 Unit/ml Soln (Insulin detemir) .[name] KitchenMarland KitchenMarland KitchenMarland Kitchen  55 units at bedtime  Addendum - serum glucose normal, renal function normal, CBC normal    Novolog Flexpen 100 Unit/ml Soln (Insulin aspart) .[name] Kitchen... As directed per sliding scale  Problem # 2:  FREQUENCY, URINARY (ICD-788.41) Frequency due to diabetes vs BPH vs irritible bladder.  Plan - lab          trial of rapaflo.   Problem # 3:  HYPERTENSION (ICD-401.9) elevated BP.  Plan - resume Hyzaar/hct  His updated medication list for this problem includes:    Hyzaar 100-25 Mg Tabs (Losartan potassium-hctz) .[name] Kitchen... 1 by mouth once daily  Complete Medication List: 1)  Hyzaar 100-25 Mg Tabs (Losartan potassium-hctz) .[name] Kitchen.. 1 by mouth once daily 2)  Xanax 0.5 Mg  Tabs (Alprazolam) .[name] Kitchen.. 1 once daily as needed 3)  Viagra 100 Mg Tabs (Sildenafil citrate) .... As needed 4)  Simvastatin 20 Mg Tabs (Simvastatin) .[name] Kitchen.. 1 every pm 5)  Levemir Flexpen 100 Unit/ml Soln (Insulin detemir) .... 55 units at bedtime 6)  Novolog Flexpen 100 Unit/ml Soln (Insulin aspart) .... As directed per sliding scale 7)  Bd Insulin Syringe Ultrafine 31g X 5/16" 0.3 Ml Misc (Insulin syringe-needle u-100) .... Use as directed 8)  Protonix 40 Mg Tbec (Pantoprazole sodium) .... Take 1 by mouth once daily 9)  Promethazine-codeine 6.25-10 Mg/60ml Syrp (Promethazine-codeine) .[name] Kitchen.. 1 tsp every 6 hrs as needed cough 10)  Onetouch Test Strp (Glucose blood) .... Use one strip two times a day 11)  Lotrisone 1-0.05 % Crea (Clotrimazole-betamethasone) .... As needed  Patient: Anthony [name] Note: All result statuses are Final unless otherwise noted.  Tests: (1) BMP (METABOL)   Sodium                    145 mEq/L                   135-145   Potassium            [L]  3.4 mEq/L                   3.5-5.1   Chloride                  108 mEq/L                   96-112   Carbon Dioxide            31 mEq/L                    19-32   Glucose              [H]  108 mg/dL                   60-45   BUN                       11 mg/dL                    4-09   Creatinine                0.6 mg/dL                   8.1-1.9   Calcium                   8.6 mg/dL  8.4-10.5   GFR                       137.32 mL/min               >60  Tests: (2) CBC Platelet w/Diff (CBCD)   White Cell Count          9.1 K/uL                    4.5-10.5   Red Cell Count       [L]  4.15 Mil/uL                 4.22-5.81   Hemoglobin           [L]  12.8 g/dL                   82.9-56.2   Hematocrit           [L]  37.1 %                      39.0-52.0   MCV                       89.5 fl                     78.0-100.0   MCHC                      34.5 g/dL                   13.0-86.5   RDW                       13.1  %                      11.5-14.6   Platelet Count            213.0 K/uL                  150.0-400.0   Neutrophil %              76.2 %                      43.0-77.0   Lymphocyte %              15.4 %                      12.0-46.0   Monocyte %                5.2 %                       3.0-12.0   Eosinophils%              2.4 %                       0.0-5.0   Basophils %               0.8 %                       0.0-3.0   Neutrophill Absolute      6.9 K/uL  1.4-7.7   Lymphocyte Absolute       1.4 K/uL                    0.7-4.0   Monocyte Absolute         0.5 K/uL                    0.1-1.0  Eosinophils, Absolute                             0.2 K/uL                    0.0-0.7   Basophils Absolute        0.1 K/uL                    0.0-0.1  Tests: (3) UDip w/Micro (URINE)   Color                     LT. YELLOW       RANGE:  Yellow;Lt. Yellow   Clarity                   CLEAR                       Clear   Specific Gravity          1.015                       1.000 - 1.030   Urine Ph                  7.0                         5.0-8.0   Protein                   NEGATIVE                    Negative   Urine Glucose             NEGATIVE                    Negative   Ketones                   NEGATIVE                    Negative   Urine Bilirubin           NEGATIVE                    Negative   Blood                     TRACE-LYSED                 Negative   Urobilinogen              0.2                         0.0 - 1.0   Leukocyte Esterace        TRACE  Negative   Nitrite                   NEGATIVE                    Negative   Urine WBC                 0-2/hpf                     0-2/hpf   Urine Epith               Rare(0-4/hpf)               Rare(0-4/hpf) Laboratory Results   Urine Tests   Date/Time Reported: Lamar Sprinkles, CMA  February 03, 2010 6:14 PM   Routine Urinalysis   Color: colorless Appearance: Clear Glucose: >2000   (Normal  Range: Negative) Bilirubin: negative   (Normal Range: Negative) Ketone: >= (160)   (Normal Range: Negative) Spec. Gravity: <1.005   (Normal Range: 1.003-1.035) Blood: large   (Normal Range: Negative) pH: 6.0   (Normal Range: 5.0-8.0) Protein: trace   (Normal Range: Negative) Urobilinogen: 0.2   (Normal Range: 0-1) Nitrite: negative   (Normal Range: Negative) Leukocyte Esterace: negative   (Normal Range: Negative)     Blood Tests    Date/Time Reported: Lamar Sprinkles, CMA  February 03, 2010 6:14 PM   Glucose (random): 300 mg/dL   (Normal Range: 81-191)

## 2010-11-14 LAB — BASIC METABOLIC PANEL
BUN: 13 mg/dL (ref 6–23)
BUN: 13 mg/dL (ref 6–23)
BUN: 37 mg/dL — ABNORMAL HIGH (ref 6–23)
BUN: 39 mg/dL — ABNORMAL HIGH (ref 6–23)
BUN: 69 mg/dL — ABNORMAL HIGH (ref 6–23)
CO2: 15 mEq/L — ABNORMAL LOW (ref 19–32)
CO2: 19 mEq/L (ref 19–32)
CO2: 20 mEq/L (ref 19–32)
CO2: 20 mEq/L (ref 19–32)
CO2: 20 mEq/L (ref 19–32)
CO2: 21 mEq/L (ref 19–32)
CO2: 6 mEq/L — CL (ref 19–32)
Calcium: 7 mg/dL — ABNORMAL LOW (ref 8.4–10.5)
Calcium: 7.1 mg/dL — ABNORMAL LOW (ref 8.4–10.5)
Calcium: 7.2 mg/dL — ABNORMAL LOW (ref 8.4–10.5)
Calcium: 7.2 mg/dL — ABNORMAL LOW (ref 8.4–10.5)
Calcium: 7.4 mg/dL — ABNORMAL LOW (ref 8.4–10.5)
Calcium: 7.6 mg/dL — ABNORMAL LOW (ref 8.4–10.5)
Calcium: 7.6 mg/dL — ABNORMAL LOW (ref 8.4–10.5)
Calcium: 7.6 mg/dL — ABNORMAL LOW (ref 8.4–10.5)
Calcium: 8.5 mg/dL (ref 8.4–10.5)
Chloride: 114 mEq/L — ABNORMAL HIGH (ref 96–112)
Chloride: 120 mEq/L — ABNORMAL HIGH (ref 96–112)
Chloride: 124 mEq/L — ABNORMAL HIGH (ref 96–112)
Chloride: 126 mEq/L — ABNORMAL HIGH (ref 96–112)
Chloride: 127 mEq/L — ABNORMAL HIGH (ref 96–112)
Chloride: 95 mEq/L — ABNORMAL LOW (ref 96–112)
Creatinine, Ser: 0.69 mg/dL (ref 0.4–1.5)
Creatinine, Ser: 0.76 mg/dL (ref 0.4–1.5)
Creatinine, Ser: 0.81 mg/dL (ref 0.4–1.5)
Creatinine, Ser: 0.93 mg/dL (ref 0.4–1.5)
Creatinine, Ser: 0.93 mg/dL (ref 0.4–1.5)
Creatinine, Ser: 1.28 mg/dL (ref 0.4–1.5)
Creatinine, Ser: 1.57 mg/dL — ABNORMAL HIGH (ref 0.4–1.5)
Creatinine, Ser: 2.03 mg/dL — ABNORMAL HIGH (ref 0.4–1.5)
Creatinine, Ser: 2.48 mg/dL — ABNORMAL HIGH (ref 0.4–1.5)
GFR calc Af Amer: 22 mL/min — ABNORMAL LOW (ref >60)
GFR calc Af Amer: 32 mL/min — ABNORMAL LOW (ref >60)
GFR calc Af Amer: 41 mL/min — ABNORMAL LOW (ref >60)
GFR calc Af Amer: >60 mL/min (ref >60)
GFR calc Af Amer: >60 mL/min (ref >60)
GFR calc Af Amer: >60 mL/min (ref >60)
GFR calc Af Amer: >60 mL/min (ref >60)
GFR calc non Af Amer: 18 mL/min — ABNORMAL LOW (ref >60)
GFR calc non Af Amer: 27 mL/min — ABNORMAL LOW (ref >60)
GFR calc non Af Amer: 34 mL/min — ABNORMAL LOW (ref >60)
GFR calc non Af Amer: 53 mL/min — ABNORMAL LOW (ref >60)
GFR calc non Af Amer: >60 mL/min (ref >60)
GFR calc non Af Amer: >60 mL/min (ref >60)
Glucose, Bld: 1147 mg/dL (ref 70–99)
Glucose, Bld: 125 mg/dL — ABNORMAL HIGH (ref 70–99)
Glucose, Bld: 193 mg/dL — ABNORMAL HIGH (ref 70–99)
Glucose, Bld: 206 mg/dL — ABNORMAL HIGH (ref 70–99)
Glucose, Bld: 235 mg/dL — ABNORMAL HIGH (ref 70–99)
Glucose, Bld: 307 mg/dL — ABNORMAL HIGH (ref 70–99)
Glucose, Bld: 389 mg/dL — ABNORMAL HIGH (ref 70–99)
Glucose, Bld: 79 mg/dL (ref 70–99)
Potassium: 2.9 mEq/L — ABNORMAL LOW (ref 3.5–5.1)
Potassium: 3.4 mEq/L — ABNORMAL LOW (ref 3.5–5.1)
Sodium: 133 mEq/L — ABNORMAL LOW (ref 135–145)
Sodium: 143 mEq/L (ref 135–145)
Sodium: 145 mEq/L (ref 135–145)
Sodium: 145 mEq/L (ref 135–145)
Sodium: 146 mEq/L — ABNORMAL HIGH (ref 135–145)
Sodium: 149 mEq/L — ABNORMAL HIGH (ref 135–145)
Sodium: 150 mEq/L — ABNORMAL HIGH (ref 135–145)
Sodium: 151 mEq/L — ABNORMAL HIGH (ref 135–145)

## 2010-11-14 LAB — POCT I-STAT 3, VENOUS BLOOD GAS (G3P V)
Acid-base deficit: 23 mmol/L — ABNORMAL HIGH (ref 0.0–2.0)
Bicarbonate: 6.2 mEq/L — ABNORMAL LOW (ref 20.0–24.0)
O2 Saturation: 79 %
TCO2: 7 mmol/L (ref 0–100)
pCO2, Ven: 23.5 mmHg — ABNORMAL LOW (ref 45.0–50.0)
pH, Ven: 7.028 — CL (ref 7.250–7.300)
pO2, Ven: 62 mmHg — ABNORMAL HIGH (ref 30.0–45.0)

## 2010-11-14 LAB — GLUCOSE, CAPILLARY
Glucose-Capillary: 107 mg/dL — ABNORMAL HIGH (ref 70–99)
Glucose-Capillary: 117 mg/dL — ABNORMAL HIGH (ref 70–99)
Glucose-Capillary: 119 mg/dL — ABNORMAL HIGH (ref 70–99)
Glucose-Capillary: 139 mg/dL — ABNORMAL HIGH (ref 70–99)
Glucose-Capillary: 154 mg/dL — ABNORMAL HIGH (ref 70–99)
Glucose-Capillary: 157 mg/dL — ABNORMAL HIGH (ref 70–99)
Glucose-Capillary: 191 mg/dL — ABNORMAL HIGH (ref 70–99)
Glucose-Capillary: 198 mg/dL — ABNORMAL HIGH (ref 70–99)
Glucose-Capillary: 199 mg/dL — ABNORMAL HIGH (ref 70–99)
Glucose-Capillary: 204 mg/dL — ABNORMAL HIGH (ref 70–99)
Glucose-Capillary: 209 mg/dL — ABNORMAL HIGH (ref 70–99)
Glucose-Capillary: 223 mg/dL — ABNORMAL HIGH (ref 70–99)
Glucose-Capillary: 223 mg/dL — ABNORMAL HIGH (ref 70–99)
Glucose-Capillary: 231 mg/dL — ABNORMAL HIGH (ref 70–99)
Glucose-Capillary: 240 mg/dL — ABNORMAL HIGH (ref 70–99)
Glucose-Capillary: 245 mg/dL — ABNORMAL HIGH (ref 70–99)
Glucose-Capillary: 250 mg/dL — ABNORMAL HIGH (ref 70–99)
Glucose-Capillary: 269 mg/dL — ABNORMAL HIGH (ref 70–99)
Glucose-Capillary: 291 mg/dL — ABNORMAL HIGH (ref 70–99)
Glucose-Capillary: 292 mg/dL — ABNORMAL HIGH (ref 70–99)
Glucose-Capillary: 292 mg/dL — ABNORMAL HIGH (ref 70–99)
Glucose-Capillary: 302 mg/dL — ABNORMAL HIGH (ref 70–99)
Glucose-Capillary: 343 mg/dL — ABNORMAL HIGH (ref 70–99)
Glucose-Capillary: 420 mg/dL — ABNORMAL HIGH (ref 70–99)
Glucose-Capillary: 432 mg/dL — ABNORMAL HIGH (ref 70–99)
Glucose-Capillary: 510 mg/dL — ABNORMAL HIGH (ref 70–99)
Glucose-Capillary: 553 mg/dL (ref 70–99)
Glucose-Capillary: 79 mg/dL (ref 70–99)
Glucose-Capillary: 97 mg/dL (ref 70–99)
Glucose-Capillary: >600 mg/dL (ref 70–99)
Glucose-Capillary: >600 mg/dL (ref 70–99)
Glucose-Capillary: >600 mg/dL (ref 70–99)

## 2010-11-14 LAB — CBC
HCT: 32.5 % — ABNORMAL LOW (ref 39.0–52.0)
HCT: 45 % (ref 39.0–52.0)
Hemoglobin: 12.9 g/dL — ABNORMAL LOW (ref 13.0–17.0)
Hemoglobin: 14.7 g/dL (ref 13.0–17.0)
MCHC: 32.6 g/dL (ref 30.0–36.0)
MCHC: 34.3 g/dL (ref 30.0–36.0)
MCHC: 35.2 g/dL (ref 30.0–36.0)
MCV: 89.9 fL (ref 78.0–100.0)
MCV: 90.1 fL (ref 78.0–100.0)
MCV: 95.9 fL (ref 78.0–100.0)
Platelets: 110 10*3/uL — ABNORMAL LOW (ref 150–400)
Platelets: 362 K/uL (ref 150–400)
RBC: 4.69 MIL/uL (ref 4.22–5.81)
RDW: 13.1 % (ref 11.5–15.5)
RDW: 13.4 % (ref 11.5–15.5)
RDW: 13.7 % (ref 11.5–15.5)
RDW: 14.1 % (ref 11.5–15.5)
WBC: 24.9 10*3/uL — ABNORMAL HIGH (ref 4.0–10.5)
WBC: 6.6 10*3/uL (ref 4.0–10.5)

## 2010-11-14 LAB — CARDIAC PANEL(CRET KIN+CKTOT+MB+TROPI)
CK, MB: 11.1 ng/mL (ref 0.3–4.0)
CK, MB: 13.4 ng/mL (ref 0.3–4.0)
CK, MB: 25.1 ng/mL (ref 0.3–4.0)
CK, MB: 29.8 ng/mL (ref 0.3–4.0)
Relative Index: 2.2 (ref 0.0–2.5)
Relative Index: 5.1 — ABNORMAL HIGH (ref 0.0–2.5)
Total CK: 547 U/L — ABNORMAL HIGH (ref 7–232)
Troponin I: 0.19 ng/mL — ABNORMAL HIGH (ref 0.00–0.06)

## 2010-11-14 LAB — DIFFERENTIAL
Basophils Absolute: 0.2 K/uL — ABNORMAL HIGH (ref 0.0–0.1)
Basophils Relative: 1 % (ref 0–1)
Eosinophils Absolute: 0 K/uL (ref 0.0–0.7)
Eosinophils Relative: 0 % (ref 0–5)
Lymphocytes Relative: 8 % — ABNORMAL LOW (ref 12–46)
Lymphs Abs: 2 K/uL (ref 0.7–4.0)
Monocytes Absolute: 1.5 10*3/uL — ABNORMAL HIGH (ref 0.1–1.0)
Monocytes Relative: 6 % (ref 3–12)
Neutro Abs: 21.1 10*3/uL — ABNORMAL HIGH (ref 1.7–7.7)
Neutrophils Relative %: 85 % — ABNORMAL HIGH (ref 43–77)

## 2010-11-14 LAB — RAPID URINE DRUG SCREEN, HOSP PERFORMED
Amphetamines: NOT DETECTED
Barbiturates: NOT DETECTED
Tetrahydrocannabinol: NOT DETECTED

## 2010-11-14 LAB — COMPREHENSIVE METABOLIC PANEL WITH GFR
ALT: 19 U/L (ref 0–53)
Alkaline Phosphatase: 111 U/L (ref 39–117)
CO2: 6 meq/L — CL (ref 19–32)
GFR calc Af Amer: 21 mL/min — ABNORMAL LOW (ref >60)
Glucose, Bld: 1266 mg/dL (ref 70–99)
Total Bilirubin: 2 mg/dL — ABNORMAL HIGH (ref 0.3–1.2)
Total Protein: 6 g/dL (ref 6.0–8.3)

## 2010-11-14 LAB — URINE CULTURE: Colony Count: 45000

## 2010-11-14 LAB — COMPREHENSIVE METABOLIC PANEL
AST: 17 U/L (ref 0–37)
Albumin: 3.5 g/dL (ref 3.5–5.2)
BUN: 68 mg/dL — ABNORMAL HIGH (ref 6–23)
Calcium: 8.3 mg/dL — ABNORMAL LOW (ref 8.4–10.5)
Chloride: 99 mEq/L (ref 96–112)
Creatinine, Ser: 3.62 mg/dL — ABNORMAL HIGH (ref 0.4–1.5)
GFR calc non Af Amer: 17 mL/min — ABNORMAL LOW (ref >60)
Potassium: 5.5 mEq/L — ABNORMAL HIGH (ref 3.5–5.1)
Sodium: 135 mEq/L (ref 135–145)

## 2010-11-14 LAB — URINALYSIS, ROUTINE W REFLEX MICROSCOPIC
Glucose, UA: >1000 mg/dL — AB
Ketones, ur: 40 mg/dL — AB
Leukocytes, UA: NEGATIVE
Nitrite: NEGATIVE
Protein, ur: NEGATIVE mg/dL
Specific Gravity, Urine: 1.027 (ref 1.005–1.030)
Urobilinogen, UA: 0.2 mg/dL (ref 0.0–1.0)
pH: 5 (ref 5.0–8.0)

## 2010-11-14 LAB — TSH: TSH: 0.342 u[IU]/mL — ABNORMAL LOW (ref 0.350–4.500)

## 2010-11-14 LAB — POCT I-STAT, CHEM 8
BUN: 82 mg/dL — ABNORMAL HIGH (ref 6–23)
Calcium, Ion: 1.03 mmol/L — ABNORMAL LOW (ref 1.12–1.32)
Chloride: 108 mEq/L (ref 96–112)
Creatinine, Ser: 2.4 mg/dL — ABNORMAL HIGH (ref 0.4–1.5)
Glucose, Bld: >700 mg/dL (ref 70–99)
HCT: 49 % (ref 39.0–52.0)
Hemoglobin: 16.7 g/dL (ref 13.0–17.0)
Potassium: 6 meq/L — ABNORMAL HIGH (ref 3.5–5.1)
Sodium: 130 meq/L — ABNORMAL LOW (ref 135–145)
TCO2: 5 mmol/L (ref 0–100)

## 2010-11-14 LAB — CULTURE, BLOOD (ROUTINE X 2)

## 2010-11-14 LAB — LIPID PANEL
LDL Cholesterol: 40 mg/dL (ref 0–99)
Total CHOL/HDL Ratio: 3.3 RATIO
Triglycerides: 90 mg/dL (ref <150)
VLDL: 18 mg/dL (ref 0–40)

## 2010-11-14 LAB — URINE MICROSCOPIC-ADD ON

## 2010-11-14 LAB — MAGNESIUM: Magnesium: 2.7 mg/dL — ABNORMAL HIGH (ref 1.5–2.5)

## 2010-11-14 LAB — TROPONIN I: Troponin I: 0.07 ng/mL — ABNORMAL HIGH (ref 0.00–0.06)

## 2010-11-14 LAB — LIPASE, BLOOD: Lipase: 34 U/L (ref 11–59)

## 2010-11-14 LAB — KETONES, QUALITATIVE

## 2010-11-14 LAB — MRSA PCR SCREENING: MRSA by PCR: NEGATIVE

## 2010-12-05 LAB — GLUCOSE, CAPILLARY: Glucose-Capillary: 355 mg/dL — ABNORMAL HIGH (ref 70–99)

## 2010-12-12 ENCOUNTER — Telehealth: Payer: Self-pay | Admitting: *Deleted

## 2010-12-12 DIAGNOSIS — E119 Type 2 diabetes mellitus without complications: Secondary | ICD-10-CM

## 2010-12-12 NOTE — Telephone Encounter (Signed)
Due now for A1C. Last OV October - recommend OV 2 days or so after lab.

## 2010-12-12 NOTE — Telephone Encounter (Signed)
Patient requesting to know when he is due for next A1c and when does MD want to see pt again for OV?

## 2010-12-13 ENCOUNTER — Other Ambulatory Visit (INDEPENDENT_AMBULATORY_CARE_PROVIDER_SITE_OTHER): Payer: Self-pay

## 2010-12-13 DIAGNOSIS — E119 Type 2 diabetes mellitus without complications: Secondary | ICD-10-CM

## 2010-12-13 LAB — HEMOGLOBIN A1C: Hgb A1c MFr Bld: 7.8 % — ABNORMAL HIGH (ref 4.6–6.5)

## 2010-12-20 ENCOUNTER — Encounter: Payer: Self-pay | Admitting: Internal Medicine

## 2010-12-20 ENCOUNTER — Ambulatory Visit (INDEPENDENT_AMBULATORY_CARE_PROVIDER_SITE_OTHER): Payer: PRIVATE HEALTH INSURANCE | Admitting: Internal Medicine

## 2010-12-20 VITALS — BP 142/82 | HR 77 | Temp 97.4°F | Wt 211.0 lb

## 2010-12-20 DIAGNOSIS — E119 Type 2 diabetes mellitus without complications: Secondary | ICD-10-CM

## 2010-12-20 DIAGNOSIS — I10 Essential (primary) hypertension: Secondary | ICD-10-CM

## 2010-12-20 NOTE — Patient Instructions (Signed)
Diabetes - A1C 7.8%. Since September '11 - 7.9%, Dec '11 8.0%, April '12 7.8%. Plan continue the sliding scale like you are doing. Increase the Levemir to 58 units at bedtime.  Foot  Pain - sounds like plantar fascititis. Plan - good stretching, supportive shoes. Arthritis - for hand stiffness using warm water soaks will reduce the "gel" period - the time it takes for your joints to loosen up.  Blood pressure 142/82 is a little high. Please check BP when not at my office: if the top number is consistently 140+ will need to adjust medications. Keep up the rod and reel therapy.

## 2010-12-20 NOTE — Progress Notes (Signed)
  Subjective:    Patient ID: Anthony [name], male    DOB: 12/04/1945, 65 y.o.   MRN: [id number]  HPIPatient presents for follow-up of diabetes: he is using a modified sliding  Scale and basal therapy with levemir. He reports that his blood sugars are usually less than two hundred. His A1Cs have been consistent since September at 7.8-8.0%.   PMH, FamHx and SocHx reviewed for any changes and relevance.     Review of Systems Review of Systems  Constitutional:  Negative for fever, chills, activity change and unexpected weight change.  HENT:  Negative for hearing loss, ear pain, congestion, neck stiffness and postnasal drip.   Eyes: Negative for pain, discharge and visual disturbance.  Respiratory: Negative for chest tightness and wheezing.   Cardiovascular: Negative for chest pain and palpitations.       [No decreased exercise tolerance Gastrointestinal: [No change in bowel habit. No bloating or gas. No reflux or indigestion Genitourinary: Negative for urgency, frequency, flank pain and difficulty urinating.  Musculoskeletal: Negative for myalgias, back pain, arthralgias and gait problem.  Neurological: Negative for dizziness, tremors, weakness and headaches.  Hematological: Negative for adenopathy.  Psychiatric/Behavioral: Negative for behavioral problems and dysphoric mood.       Objective:   Physical Exam WNWD white male in no disress HEENT - C&S clear, vision normal Chest - no increased WOB, no wheezing Cor - RRR      Assessment & Plan:  1. Diabetes- patient is doing well. His fasting CBG rarely low and frequently a little high based on review of his home record.  Plan - target A1C at 8% or less           Continue present medications, but increase Levemir to 38 units           Continue to keep a log.  2. Hypertension - well controlled.

## 2010-12-21 ENCOUNTER — Encounter: Payer: Self-pay | Admitting: Internal Medicine

## 2011-01-10 NOTE — Op Note (Signed)
NAME:  Anthony [name], Anthony [name] NO.:  [id number]   MEDICAL RECORD NO.:  [id number]          PATIENT TYPE:  AMB   LOCATION:  [facility]                         FACILITY:  [facility]   PHYSICIAN:  Mark C. Vernie Ammons, M.D.  DATE OF BIRTH:  1945-09-28   DATE OF PROCEDURE:  08/09/2007  DATE OF DISCHARGE:                               OPERATIVE REPORT   PREOPERATIVE DIAGNOSES:  Recurrent balanitis.   POSTOPERATIVE DIAGNOSES:  Recurrent balanitis.   PROCEDURE:  Circumcision.   SURGEON:  Dr. Ihor Gully.   ANESTHESIA:  General with local supplement.   SPECIMENS:  None.   BLOOD LOSS:  Minimal.   DRAINS:  None.   COMPLICATIONS:  None.   INDICATIONS:  The patient is a 65 year old, white male with diabetes.  He has a history of recurrent balanitis.  He has tried topical  treatments without lasting success and we therefore discussed  circumcision as a treatment.  We also went over the other options.  He  understands and elected to proceed with surgery.   DESCRIPTION OF OPERATION:  After informed consent, the patient was  brought to the major OR, placed on the table, administered general  anesthesia and then maintained in the supine position and then underwent  sterile prepping of the genitalia and draping.  An official time-out was  then performed.   I injected a total 17 mL of 0.5% plain Marcaine both as a dorsal penile  block as well as a proximal ring block in the standard fashion.  I then  marked the shaft skin where the underlying corona was noted to be  located.  I then retracted the foreskin and made a circumcising incision  circumferentially approximately 5 mm from the corona. The foreskin was  then retracted back to its normal anatomic position and the shaft skin  incised as well in a circumferential fashion following the line  previously drawn.  I then divided the foreskin in the midline and used  sharp dissection to dissect this from the penile shaft.  Bleeding points  were cauterized and a frenula U stitch was then placed at the 6 o'clock  position using 3-0 chromic suture. A second 3-0 chromic was placed at  the 12 o'clock position to reapproximate skin edges and then the skin  edges were completely reapproximated by using 3-0 chromic in a running  fashion.  Neosporin loose gauze dressing and loose Kling were applied as  a dressing and the patient was awakened and taken to the recovery room  in stable satisfactory condition.  He tolerated the procedure well and  there were no intraoperative complications.   He will be given a prescription for Tylox #36 and Keflex 250 mg q.i.d.  #20.  He will be scheduled for follow-up in 4 weeks and was given  written instructions and told to contact me sooner should he have any  difficulty.      Mark C. Vernie Ammons, M.D.  Electronically Signed     MCO/MEDQ  D:  08/09/2007  T:  08/10/2007  Job:  [id number]

## 2011-01-13 NOTE — Op Note (Signed)
[name], [name]                      ACCOUNT NO.:  [id number]   MEDICAL RECORD NO.:  [id number]                   PATIENT TYPE:  AMB   LOCATION:  [facility]                                 FACILITY:  [facility]   PHYSICIAN:  Charolett Bumpers, M.D.             DATE OF BIRTH:  05-29-1946   DATE OF PROCEDURE:  DATE OF DISCHARGE:                                 OPERATIVE REPORT   PROCEDURE:  Colonoscopy and polypectomy.   INDICATIONS FOR PROCEDURE:  [name] is a 65 year old male born  10/27/1945. Anthony [name] is scheduled to undergo his first screening  colonoscopy with polypectomy to prevent colon cancer. I discussed with Mr.  [name] the complications associated with colonoscopy and polypectomy  including a 15/1000 risk of bleeding and 08/998 risk of colon perforation  requiring surgical repair. Anthony [name] has signed the operative permit.   ENDOSCOPIST:  Charolett Bumpers, M.D.   PREMEDICATION:  Versed 10 mg, Fentanyl 50 mcg.   ENDOSCOPE:  Olympus pediatric colonoscope.   DESCRIPTION OF PROCEDURE:  After obtaining informed consent, Anthony [name]  was placed in the left lateral decubitus position. I administered  intravenous Demerol and intravenous fentanyl to achieve conscious sedation  for the procedure. The patient's blood pressure, oxygen saturation and  cardiac rhythm were monitored throughout the procedure and documented in the  medical record.   Anal inspection was normal. Digital rectal exam revealed a nonnodular  prostate. The Olympus pediatric video colonoscope was introduced into the  rectum and advanced to the cecum. A normal appearing ileocecal valve was  intubated and the distal ileum inspected. Colonic preparation for the exam  today was excellent.   RECTUM:  Normal.   SIGMOID COLON AND DESCENDING COLON:  At 40 cm from the anal verge, a 2 mm  sessile polyp was removed with the electrocautery snare and a 1.5 cm  pedunculated polyp was  removed with the electrocautery snare. Both polyps  were submitted in one bottle for pathological evaluation.   SPLENIC FLEXURE:  Normal.   TRANSVERSE COLON:  A 2 mm sessile polyp was removed from the proximal  transverse colon with the electrocautery snare; a 2 mm sessile polyp was  removed from the distal transverse colon with the electrocautery snare. Both  polyps were submitted in one bottle for pathological evaluation.   HEPATIC FLEXURE:  Normal.   ASCENDING COLON:  Normal.   CECUM AND ILEOCECAL VALVE:  Normal.   DISTAL ILEUM:  Normal.   ASSESSMENT:  Two small polyps were removed from the transverse colon and  submitted for pathological interpretation; from the sigmoid colon at 40 cm  from the anal verge, a 2 mm sessile polyp was removed and a 1.5 cm  pedunculated polyp was removed.  Charolett Bumpers, M.D.    MKJ/MEDQ  D:  05/08/2002  T:  05/08/2002  Job:  [id number]   cc:   Wilson Singer, M.D.

## 2011-01-13 NOTE — Assessment & Plan Note (Signed)
[facility]                           PRIMARY CARE OFFICE NOTE   Anthony [name], Anthony [name]                    MRN:          [id number]  DATE:11/30/2006                            DOB:          July 18, 1946    [name] is a 65 year old gentleman who presents to establish  ongoing continuity care.  He was formerly followed by Dr. Karilyn Cota who  has closed his practice.  The patient has no active complaints at  today's visit but does report his diabetes has been out of control.   PAST MEDICAL HISTORY:   SURGICAL:  1. Wisdom teeth extracted in his 30s.  2. Vasectomy by Dr. Mickel Crow.  3. Laparoscopic cholecystectomy in 2001 by Dr. Francina Ames.   TRAUMA:  None.   MEDICAL:  1. Bilateral ear infections as a youth.  2. The patient had the usual childhood diseases.  3. Rheumatic fever as a child.  4. GERD.  5. Hypertension.  6. Anxiety.  7. Diabetes diagnosed in 2003 which has been difficult to control.   CURRENT MEDICATIONS:  1. Humalog 50/50, 35 units b.i.d.  2. Glipizide 5 mg daily.  3. Protonix 40 mg daily.  4. Hyzaar 100/25 one daily.  5. Alprazolam 0.5 mg p.r.n.  6. Viagra 100 mg p.r.n.   FAMILY HISTORY:  Father died in his 60s.  He had alcohol abuse problem;  he had cancer of unknown type.  Mother died of non-Hodgkin's lymphoma;  she had cardiac problems.  The patient has three brother, one who is a  diabetic, diet controlled; one who is diabetic on insulin pump.  No  family history for colon cancer, lung cancer, or stroke.   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  He has been married 38 years.  He has two  daughters, ages 63 and 76, one son, age 42.  He has seven grandchildren.  The patient's wife has osteoporosis but otherwise has been  in good  health.  The patient is gearing up to retire, although he loves his work  and would like to find a way to continue to keep his hand in.   HABITS:  The patient uses no alcohol.  He does have a significant 30-  pack-year smoking history but is now down to 4 cigarettes a day.   REVIEW OF SYSTEMS:  The patient has had no fevers, sweats, or chills.  He does have sleep-duration insomnia.  He has had an eye exam in the  last 12 months which was unremarkable.  No ENT complaints.  The patient  does have a rare chest pain which is associated with stress.  He did  have a nuclear stress study in 2006 which was negative.  No respiratory  complaints, no GI complaints except for a bad diarrhea about a year ago  which was persistent.  The patient has had balanitis with inflammation  and discomfort.  He has no phimosis and reports he is able to retract  his foreskin.  The patient has diffuse myalgias and arthralgias.  The  patient has several dry, scaly lesions on his arms.   Review of Dr. Patty Sermons record indicates the patient's last hemoglobin  A1c was 8.8% October 22, 2006.  The patient had a nuclear cardiology  study as noted October 28, 2004, which was read as a normal stress  Cardiolite with no evidence of ischemia.   The patient had an MRI of the cervical spine October 27, 2002, because of a  complaint of cervical pain.  This was read out as showing left  paramedian disk herniation at C6-7 which appears to have encroached upon  the exiting left C6 nerve root as well as left C7 nerve root.  There was  a small posterior disk osteophyte complex at C5-6 with no central canal  stenosis or neural foraminal narrowing.   The patient had shoulder films September 16, 2002, which involved change  in the [facility] joint on the clavicular side.   Additional laboratory also from October 22, 2006, revealed the  patient's serum glucose to be 105.  Creatinine was 0.8.  Electrolytes  were normal.  Liver functions  were normal.  Cholesterol 177,  triglycerides 89, HDL 37, LDL 122.   ASSESSMENT AND PLAN:  1. Diabetes.  The patient is poorly controlled on his present regimen.      Discussed several alternatives with him including basal insulin      therapy.  Plan will be to have the patient continue Humalog 50/50,      35 units b.i.d.  Will add metformin 500 mg b.i.d. to his regimen      and discontinue glipizide since the patient has been having      hypoglycemic episodes.  2. Hypertension.  The patient's blood pressure is adequately      controlled on his present medications, and he will continue the      same.  3. Gastrointestinal.  The patient is stable using Protonix on a daily      basis.  4. Lipids.  The patient is not at goal with an LDL of 122.  Given his      diabetes, he should have an LDL of less than 100.  He would be a      candidate for medical therapy because of the positive advantages of      statin  drugs.  In addition, the patient would need to have a      urine for microalbuminuria to see if he is a candidate for ACE      inhibitor therapy.   The patient is asked to return to see me in 2-3 weeks for followup on  his blood sugars and will discuss these other issues at that time.     Rosalyn Gess Norins, MD  Electronically Signed    MEN/MedQ  DD: 12/01/2006  DT: 12/01/2006  Job #: [id number]

## 2011-01-27 ENCOUNTER — Other Ambulatory Visit: Payer: Self-pay | Admitting: Internal Medicine

## 2011-03-06 ENCOUNTER — Other Ambulatory Visit: Payer: Self-pay | Admitting: Internal Medicine

## 2011-03-07 ENCOUNTER — Other Ambulatory Visit: Payer: Self-pay | Admitting: Internal Medicine

## 2011-03-07 NOTE — Telephone Encounter (Signed)
Rx Done . 

## 2011-03-15 ENCOUNTER — Encounter: Payer: Self-pay | Admitting: Gastroenterology

## 2011-04-04 ENCOUNTER — Other Ambulatory Visit (INDEPENDENT_AMBULATORY_CARE_PROVIDER_SITE_OTHER): Payer: PRIVATE HEALTH INSURANCE

## 2011-04-04 ENCOUNTER — Other Ambulatory Visit: Payer: Self-pay | Admitting: Internal Medicine

## 2011-04-04 DIAGNOSIS — E119 Type 2 diabetes mellitus without complications: Secondary | ICD-10-CM

## 2011-04-04 LAB — COMPREHENSIVE METABOLIC PANEL
AST: 19 U/L (ref 0–37)
Albumin: 4.1 g/dL (ref 3.5–5.2)
Alkaline Phosphatase: 76 U/L (ref 39–117)
Potassium: 3.7 mEq/L (ref 3.5–5.1)
Sodium: 141 mEq/L (ref 135–145)
Total Protein: 6.6 g/dL (ref 6.0–8.3)

## 2011-04-10 ENCOUNTER — Encounter: Payer: Self-pay | Admitting: Internal Medicine

## 2011-06-04 ENCOUNTER — Other Ambulatory Visit: Payer: Self-pay | Admitting: Internal Medicine

## 2011-06-05 LAB — POCT I-STAT 4, (NA,K, GLUC, HGB,HCT)
Glucose, Bld: 135 — ABNORMAL HIGH
HCT: 42
Hemoglobin: 14.3
Operator id: 118191
Potassium: 3.6
Sodium: 143

## 2011-07-04 ENCOUNTER — Inpatient Hospital Stay (HOSPITAL_COMMUNITY)
Admission: EM | Admit: 2011-07-04 | Discharge: 2011-07-10 | DRG: 883 | Disposition: A | Discharge status: Home / Self Care | Payer: BC Managed Care – PPO | Attending: Surgery | Admitting: Surgery

## 2011-07-04 ENCOUNTER — Encounter (HOSPITAL_COMMUNITY): Payer: Self-pay | Admitting: *Deleted

## 2011-07-04 ENCOUNTER — Emergency Department (HOSPITAL_COMMUNITY): Payer: BC Managed Care – PPO

## 2011-07-04 ENCOUNTER — Other Ambulatory Visit: Payer: Self-pay

## 2011-07-04 DIAGNOSIS — K929 Disease of digestive system, unspecified: Secondary | ICD-10-CM | POA: Diagnosis not present

## 2011-07-04 DIAGNOSIS — K3533 Acute appendicitis with perforation and localized peritonitis, with abscess: Principal | ICD-10-CM | POA: Diagnosis present

## 2011-07-04 DIAGNOSIS — E113299 Type 2 diabetes mellitus with mild nonproliferative diabetic retinopathy without macular edema, unspecified eye: Secondary | ICD-10-CM | POA: Diagnosis present

## 2011-07-04 DIAGNOSIS — Z794 Long term (current) use of insulin: Secondary | ICD-10-CM

## 2011-07-04 DIAGNOSIS — K3532 Acute appendicitis with perforation and localized peritonitis, without abscess: Secondary | ICD-10-CM

## 2011-07-04 DIAGNOSIS — Y836 Removal of other organ (partial) (total) as the cause of abnormal reaction of the patient, or of later complication, without mention of misadventure at the time of the procedure: Secondary | ICD-10-CM | POA: Diagnosis present

## 2011-07-04 DIAGNOSIS — F411 Generalized anxiety disorder: Secondary | ICD-10-CM | POA: Diagnosis present

## 2011-07-04 DIAGNOSIS — M79609 Pain in unspecified limb: Secondary | ICD-10-CM | POA: Diagnosis present

## 2011-07-04 DIAGNOSIS — K560 Paralytic ileus: Secondary | ICD-10-CM | POA: Diagnosis not present

## 2011-07-04 DIAGNOSIS — R222 Localized swelling, mass and lump, trunk: Secondary | ICD-10-CM | POA: Diagnosis present

## 2011-07-04 DIAGNOSIS — F17200 Nicotine dependence, unspecified, uncomplicated: Secondary | ICD-10-CM | POA: Diagnosis present

## 2011-07-04 DIAGNOSIS — K219 Gastro-esophageal reflux disease without esophagitis: Secondary | ICD-10-CM | POA: Diagnosis present

## 2011-07-04 DIAGNOSIS — E119 Type 2 diabetes mellitus without complications: Secondary | ICD-10-CM | POA: Diagnosis present

## 2011-07-04 DIAGNOSIS — I10 Essential (primary) hypertension: Secondary | ICD-10-CM | POA: Diagnosis present

## 2011-07-04 DIAGNOSIS — H6690 Otitis media, unspecified, unspecified ear: Secondary | ICD-10-CM | POA: Diagnosis present

## 2011-07-04 LAB — URINALYSIS, ROUTINE W REFLEX MICROSCOPIC
Bilirubin Urine: NEGATIVE
Leukocytes, UA: NEGATIVE
Nitrite: NEGATIVE
Specific Gravity, Urine: 1.025 (ref 1.005–1.030)
Urobilinogen, UA: 0.2 mg/dL (ref 0.0–1.0)
pH: 5 (ref 5.0–8.0)

## 2011-07-04 LAB — COMPREHENSIVE METABOLIC PANEL
Albumin: 3.6 g/dL (ref 3.5–5.2)
Alkaline Phosphatase: 102 U/L (ref 39–117)
BUN: 13 mg/dL (ref 6–23)
Calcium: 8.9 mg/dL (ref 8.4–10.5)
GFR calc Af Amer: >90 mL/min (ref >90)
Glucose, Bld: 271 mg/dL — ABNORMAL HIGH (ref 70–99)
Potassium: 3.3 mEq/L — ABNORMAL LOW (ref 3.5–5.1)
Sodium: 134 mEq/L — ABNORMAL LOW (ref 135–145)
Total Protein: 6.4 g/dL (ref 6.0–8.3)

## 2011-07-04 LAB — CBC
MCH: 31.2 pg (ref 26.0–34.0)
MCHC: 35.9 g/dL (ref 30.0–36.0)
MCV: 86.8 fL (ref 78.0–100.0)
Platelets: 188 10*3/uL (ref 150–400)

## 2011-07-04 LAB — DIFFERENTIAL
Basophils Relative: 0 % (ref 0–1)
Eosinophils Absolute: 0 10*3/uL (ref 0.0–0.7)
Eosinophils Relative: 0 % (ref 0–5)
Monocytes Relative: 4 % (ref 3–12)
Neutrophils Relative %: 91 % — ABNORMAL HIGH (ref 43–77)

## 2011-07-04 LAB — URINE MICROSCOPIC-ADD ON

## 2011-07-04 LAB — LIPASE, BLOOD: Lipase: 9 U/L — ABNORMAL LOW (ref 11–59)

## 2011-07-04 LAB — POCT I-STAT TROPONIN I

## 2011-07-04 MED ORDER — MORPHINE SULFATE 4 MG/ML IJ SOLN: 4.0000 mg | Freq: Once | INTRAMUSCULAR | Status: DC

## 2011-07-04 MED ORDER — SODIUM CHLORIDE 0.9 % IV SOLN
Freq: Once | INTRAVENOUS | Status: AC
  Administered 2011-07-04: 21:00:00 via INTRAVENOUS

## 2011-07-04 MED ORDER — PROMETHAZINE HCL 25 MG/ML IJ SOLN
12.5000 mg | Freq: Once | INTRAMUSCULAR | Status: DC
Start: 2011-07-04 — End: 2011-07-04
  Filled 2011-07-04: qty 1

## 2011-07-04 MED ORDER — IOHEXOL 300 MG/ML  SOLN
100.0000 mL | Freq: Once | INTRAMUSCULAR | Status: AC | PRN
  Administered 2011-07-04: 100 mL via INTRAVENOUS

## 2011-07-04 MED ORDER — ONDANSETRON HCL 4 MG/2ML IJ SOLN: 4.0000 mg | Freq: Once | INTRAMUSCULAR | Status: DC

## 2011-07-04 MED ORDER — MORPHINE SULFATE 4 MG/ML IJ SOLN
4.0000 mg | Freq: Once | INTRAMUSCULAR | Status: AC
  Administered 2011-07-04: 4 mg via INTRAVENOUS
  Filled 2011-07-04: qty 1

## 2011-07-04 MED ORDER — PROMETHAZINE HCL 25 MG/ML IJ SOLN
12.5000 mg | Freq: Once | INTRAMUSCULAR | Status: AC
  Administered 2011-07-04: 12.5 mg via INTRAVENOUS
  Filled 2011-07-04: qty 1

## 2011-07-04 NOTE — ED Notes (Signed)
Assumed care on pt. Call light within reach , bedrails up , family at bedside , respirations unlabaored. Pt. Waiting for EDP evaluation.

## 2011-07-04 NOTE — ED Notes (Signed)
Pharmacy notified on pt.'s Phenergan order . 

## 2011-07-04 NOTE — ED Notes (Signed)
PT. SLEEPING WITH NO PAIN OR DISCOMFORT, RESPIRATIONS UNLABORED , IVSITE UNREMARKABLE , FAMILY AT BEDSIDE, CALL LIGHT WITHIN REACH.

## 2011-07-04 NOTE — ED Notes (Signed)
Severe lower abd pain for 3 days nauseated.  No vomiting and unable to eat for 2 days.  No previous history.  No change in stool color

## 2011-07-04 NOTE — ED Provider Notes (Addendum)
History     CSN: [id number] Arrival date & time: 07/04/2011  5:43 PM   First MD Initiated Contact with Patient 07/04/11 2132      Chief Complaint  Patient presents with  . Abdominal Pain    Patient is a 65 y.o. male presenting with abdominal pain.  Abdominal Pain The primary symptoms of the illness include abdominal pain, nausea and dysuria. The primary symptoms of the illness do not include fatigue, shortness of breath, vomiting or diarrhea.  Symptoms associated with the illness do not include chills or constipation.  Patient seen and evaluated at 9:30 pm. The patient presents to emergency room with complaint of diffuse abdominal pain. Worse in the lower quadrants. Started yesterday afternoon. Patient denies any  vomiting or diarrhea. Questionable fever. Pain has been constant. Patient has had some pain with urination. Denies any hematuria. Patient has had some nausea. Patient denies any rectal bleeding.  Past Medical History  Diagnosis Date  . Type II or unspecified type diabetes mellitus without mention of complication, not stated as uncontrolled   . GERD (gastroesophageal reflux disease)   . HTN (hypertension)   . Anxiety   . Rheumatic fever   . Otitis media   . DKA (diabetic ketoacidoses) 01/2010    Past Surgical History  Procedure Date  . Vasectomy   . Cholecystectomy   . Wisdom tooth extraction     Family History  Problem Relation Age of Onset  . Lymphoma Mother     Non-hodgkins  . Coronary artery disease Mother   . Heart disease Mother   . Cancer Father   . Alcohol abuse Father   . Diabetes Brother   . Pancreatic cancer Maternal Uncle   . Colon cancer Neg Hx   . Lung cancer Neg Hx   . Diabetes Brother   . Diabetes Brother   . Diabetes Other     History  Substance Use Topics  . Smoking status: Current Everyday Smoker  . Smokeless tobacco: Not on file   Comment: 4 daily  . Alcohol Use: No      Review of Systems  Constitutional: Negative for chills,  appetite change and fatigue.  Respiratory: Negative for chest tightness and shortness of breath.   Cardiovascular: Negative for chest pain.  Gastrointestinal: Positive for nausea and abdominal pain. Negative for vomiting, diarrhea, constipation, blood in stool, anal bleeding and rectal pain.  Genitourinary: Positive for dysuria. Negative for flank pain.  Neurological: Negative for dizziness, speech difficulty and weakness.  All other systems reviewed and are negative.    Review of Systems  Constitutional: Negative for chills, appetite change and fatigue.  Respiratory: Negative for chest tightness and shortness of breath.   Cardiovascular: Negative for chest pain.  Gastrointestinal: Positive for nausea and abdominal pain. Negative for vomiting, diarrhea, constipation, blood in stool, anal bleeding and rectal pain.  Genitourinary: Positive for dysuria. Negative for flank pain.  Neurological: Negative for dizziness, speech difficulty and weakness.  All other systems reviewed and are negative.     Allergies  Review of patient's allergies indicates no known allergies.  Home Medications   Current Outpatient Rx  Name Route Sig Dispense Refill  . INSULIN ASPART 100 UNIT/ML [place] SOLN Subcutaneous Inject 11-15 Units into the skin 3 (three) times daily before meals. Use 11 units at breakfast, and 15 units at lunch and dinner     . LEVEMIR FLEXPEN 100 UNIT/ML [place] SOLN  INJECT 55 UNITS AT BEDTIME 1 Syringe 6  . LOSARTAN POTASSIUM-HCTZ  100-25 MG PO TABS  take 1 tablet by mouth once daily 30 tablet 4  . PANTOPRAZOLE SODIUM 40 MG PO TBEC  TAKE 1 TABLET BY MOUTH ONCE DAILY 30 tablet 1  . RAPAFLO 8 MG PO CAPS  take 1 capsule by mouth once daily 30 capsule 11  . SIMVASTATIN 20 MG PO TABS  TAKE 1 TABLET BY MOUTH EVERY EVENING 30 tablet 4  . ALPRAZOLAM 0.5 MG PO TABS Oral Take 0.5 mg by mouth at bedtime as needed. For anxiety     . BD PEN NEEDLE SHORT U/F 31G X 8 MM MISC  USE AS DIRECTED 100 each 5  .  NOVOLOG FLEXPEN 100 UNIT/ML  SOLN  USE AS DIRECTED BEFORE MEALS 1 Syringe 6  . ONETOUCH ULTRA BLUE VI STRP  TEST BLOOD SUGAR 5 TIMES A DAY OR AS DIRECTED BY DOCTOR 450 each 3  . SILDENAFIL CITRATE 100 MG PO TABS Oral Take 100 mg by mouth daily as needed.        BP 138/85  Pulse 95  Temp(Src) 98.1 F (36.7 C) (Oral)  Resp 22  Ht 6' (1.829 m)  Wt 210 lb (95.255 kg)  BMI 28.48 kg/m2  SpO2 93%  Physical Exam  Constitutional: He is oriented to person, place, and time. He appears well-developed and well-nourished. He appears distressed.  HENT:  Head: Normocephalic and atraumatic.  Eyes: EOM are normal. Pupils are equal, round, and reactive to light.  Neck: Normal range of motion. Neck supple.  Cardiovascular: Normal rate and regular rhythm.  Exam reveals no gallop and no friction rub.   No murmur heard. Pulmonary/Chest: He has no wheezes. He exhibits no tenderness.  Abdominal: Soft. Bowel sounds are normal. He exhibits no distension and no mass. There is tenderness. There is no rebound and no guarding.  Musculoskeletal: Normal range of motion.  Neurological: He is alert and oriented to person, place, and time.  Skin: Skin is warm and dry.  Psychiatric: He has a normal mood and affect. His behavior is normal. Judgment and thought content normal.    ED Course  Procedures (including critical care time)  10:46 PM Patient seen and re-evaluated. D/W dr. Manus Gunning. Patient is uncomfortably. Continued abdominal pain after morphine and phenergan. Patient has continued nausea. Drank 1.5 cups of contrast. CT scan called to come early due to the state of the patient. VSS. Abdomen is soft with diffuse tenderness. No guarding. Pending lab results including troponin due to the nonspecific nature of the abdominal pain.   Patient seen and re-evaluated with serial abdominal exams. Tender to palpation, no rigidity.   Patient seen and re-evaluated. Resting comfortably. Tachycardic. NAD. Patient notified  of testing results, ruptured appendicitis.  Patient stated understanding to treatment plan and diagnosis.    12:22 AM Spoke to [facility] [place] [facility] who will come in to evaluate the patient. IV ertapenem started in the emergency room. NPO.  Results for orders placed during the hospital encounter of 07/04/11  GLUCOSE, CAPILLARY      Component Value Range   Glucose-Capillary 236 (*) 70 - 99 (mg/dL)  CBC      Component Value Range   WBC 16.3 (*) 4.0 - 10.5 (K/uL)   RBC 4.91  4.22 - 5.81 (MIL/uL)   Hemoglobin 15.3  13.0 - 17.0 (g/dL)   HCT 16.1  09.6 - 04.5 (%)   MCV 86.8  78.0 - 100.0 (fL)   MCH 31.2  26.0 - 34.0 (pg)   MCHC 35.9  30.0 -  36.0 (g/dL)   RDW 16.1  09.6 - 04.5 (%)   Platelets 188  150 - 400 (K/uL)  DIFFERENTIAL      Component Value Range   Neutrophils Relative 91 (*) 43 - 77 (%)   Neutro Abs 14.9 (*) 1.7 - 7.7 (K/uL)   Lymphocytes Relative 5 (*) 12 - 46 (%)   Lymphs Abs 0.8  0.7 - 4.0 (K/uL)   Monocytes Relative 4  3 - 12 (%)   Monocytes Absolute 0.6  0.1 - 1.0 (K/uL)   Eosinophils Relative 0  0 - 5 (%)   Eosinophils Absolute 0.0  0.0 - 0.7 (K/uL)   Basophils Relative 0  0 - 1 (%)   Basophils Absolute 0.0  0.0 - 0.1 (K/uL)  COMPREHENSIVE METABOLIC PANEL      Component Value Range   Sodium 134 (*) 135 - 145 (mEq/L)   Potassium 3.3 (*) 3.5 - 5.1 (mEq/L)   Chloride 97  96 - 112 (mEq/L)   CO2 23  19 - 32 (mEq/L)   Glucose, Bld 271 (*) 70 - 99 (mg/dL)   BUN 13  6 - 23 (mg/dL)   Creatinine, Ser 4.09  0.50 - 1.35 (mg/dL)   Calcium 8.9  8.4 - 81.1 (mg/dL)   Total Protein 6.4  6.0 - 8.3 (g/dL)   Albumin 3.6  3.5 - 5.2 (g/dL)   AST 11  0 - 37 (U/L)   ALT 12  0 - 53 (U/L)   Alkaline Phosphatase 102  39 - 117 (U/L)   Total Bilirubin 1.3 (*) 0.3 - 1.2 (mg/dL)   GFR calc non Af Amer >90  >90 (mL/min)   GFR calc Af Amer >90  >90 (mL/min)  LIPASE, BLOOD      Component Value Range   Lipase 9 (*) 11 - 59 (U/L)  URINALYSIS, ROUTINE W REFLEX MICROSCOPIC      Component  Value Range   Color, Urine YELLOW  YELLOW    Appearance CLEAR  CLEAR    Specific Gravity, Urine 1.025  1.005 - 1.030    pH 5.0  5.0 - 8.0    Glucose, UA >1000 (*) NEGATIVE (mg/dL)   Hgb urine dipstick TRACE (*) NEGATIVE    Bilirubin Urine NEGATIVE  NEGATIVE    Ketones, ur >80 (*) NEGATIVE (mg/dL)   Protein, ur NEGATIVE  NEGATIVE (mg/dL)   Urobilinogen, UA 0.2  0.0 - 1.0 (mg/dL)   Nitrite NEGATIVE  NEGATIVE    Leukocytes, UA NEGATIVE  NEGATIVE   LACTIC ACID, PLASMA      Component Value Range   Lactic Acid, Venous 1.3  0.5 - 2.2 (mmol/L)  POCT I-STAT TROPONIN I      Component Value Range   Troponin i, poc 0.00  0.00 - 0.08 (ng/mL)   Comment 3           URINE MICROSCOPIC-ADD ON      Component Value Range   RBC / HPF 0-2  <3 (RBC/hpf)   Bacteria, UA RARE  RARE    Urine-Other MUCOUS PRESENT     Ct Abdomen Pelvis W Contrast  07/04/2011  *RADIOLOGY REPORT*  Clinical Data: Lower abdominal pain.  CT ABDOMEN AND PELVIS WITH CONTRAST  Technique:  Multidetector CT imaging of the abdomen and pelvis was performed following the standard protocol during bolus administration of intravenous contrast.  Contrast: OMNIPAQUE IOHEXOL 300 MG/ML IV SOLN  Comparison: 01/29/2010 radiograph  Findings: Cardiomegaly.  Bibasilar scarring versus atelectasis.  Low attenuation of the  liver suggests fatty infiltration. Subcentimeter hypodensity at the dome is likely a biliary cyst or hamartoma.  Status post cholecystectomy.  No biliary ductal dilatation.  Unremarkable spleen, pancreas, adrenal glands. A couple left renal hypodensities are too small to further characterize.  Otherwise symmetric renal enhancement.  There is mild bilateral nonspecific perinephric fat stranding.  No hydronephrosis or hydroureter.  No bowel obstruction.  No CT evidence for colitis. Cecal diverticulum.  The appendix is distended, measuring 9 mm.  There is an appendicolith at the base.  There are a couple locules of extraluminal air and  moderate amount of periappendiceal fat stranding.  No free intraperitoneal fluid.  No free intraperitoneal air elsewhere.  No lymphadenopathy. There are a couple subcentimeter lymph nodes within the mesorectal fat, nonspecific.  Thin-walled bladder.  Enlarged prostate gland.  Bilateral fat containing inguinal hernias.  There is scattered atherosclerotic calcification of the aorta and its branches. No aneurysmal dilatation.  Multilevel degenerative changes of the imaged spine. No acute or aggressive appearing osseous lesion.  IMPRESSION: Acute appendicitis. Evidence of rupture, with a few locules of extraluminal gas.  Original Report Authenticated By: Waneta Martins, M.D.    Date: 07/05/2011, 21:04  Rate: 86  Rhythm: normal sinus rhythm  QRS Axis: left  Intervals: normal  ST/T Wave abnormalities: normal  Conduction Disutrbances:left anterior fascicular block  Narrative Interpretation:   Old EKG Reviewed: unchanged, 01/30/10      MDM  Acute appendicitis  Leukocytosis  Abdominal pain      Demetrius Charity, PA 07/15/11 1829

## 2011-07-04 NOTE — ED Notes (Signed)
Oral contrast given to pt. By Radiology Tech with instructions.

## 2011-07-04 NOTE — ED Notes (Signed)
NO PAIN OR DISCOMFORT AT THIS TIME , NO NAUSEA. REFUSED MORPHINE AND ZOFRAN AT THIS TIME , WILL NOYIFY NURSE WHEN NEEDED.

## 2011-07-05 ENCOUNTER — Encounter (HOSPITAL_COMMUNITY): Admission: EM | Disposition: A | Discharge status: Home / Self Care | Payer: Self-pay | Source: Home / Self Care

## 2011-07-05 ENCOUNTER — Encounter (HOSPITAL_COMMUNITY): Payer: Self-pay | Admitting: Certified Registered"

## 2011-07-05 ENCOUNTER — Other Ambulatory Visit (INDEPENDENT_AMBULATORY_CARE_PROVIDER_SITE_OTHER): Payer: Self-pay | Admitting: General Surgery

## 2011-07-05 ENCOUNTER — Emergency Department (HOSPITAL_COMMUNITY): Payer: BC Managed Care – PPO | Admitting: Certified Registered"

## 2011-07-05 DIAGNOSIS — K352 Acute appendicitis with generalized peritonitis, without abscess: Secondary | ICD-10-CM

## 2011-07-05 DIAGNOSIS — R1031 Right lower quadrant pain: Secondary | ICD-10-CM

## 2011-07-05 DIAGNOSIS — K3580 Unspecified acute appendicitis: Secondary | ICD-10-CM

## 2011-07-05 DIAGNOSIS — K3532 Acute appendicitis with perforation and localized peritonitis, without abscess: Secondary | ICD-10-CM | POA: Diagnosis present

## 2011-07-05 HISTORY — PX: LAPAROSCOPIC APPENDECTOMY: SHX408

## 2011-07-05 LAB — CBC
MCHC: 35.2 g/dL (ref 30.0–36.0)
RDW: 12.8 % (ref 11.5–15.5)
WBC: 20.1 10*3/uL — ABNORMAL HIGH (ref 4.0–10.5)

## 2011-07-05 LAB — GLUCOSE, CAPILLARY
Glucose-Capillary: 257 mg/dL — ABNORMAL HIGH (ref 70–99)
Glucose-Capillary: 190 mg/dL — ABNORMAL HIGH (ref 70–99)

## 2011-07-05 LAB — MAGNESIUM: Magnesium: 1.6 mg/dL (ref 1.5–2.5)

## 2011-07-05 LAB — BASIC METABOLIC PANEL
CO2: 20 mEq/L (ref 19–32)
Calcium: 8.7 mg/dL (ref 8.4–10.5)
GFR calc non Af Amer: >90 mL/min (ref >90)
Potassium: 3.3 mEq/L — ABNORMAL LOW (ref 3.5–5.1)
Sodium: 138 mEq/L (ref 135–145)

## 2011-07-05 LAB — PHOSPHORUS: Phosphorus: 3.7 mg/dL (ref 2.3–4.6)

## 2011-07-05 LAB — MRSA PCR SCREENING: MRSA by PCR: NEGATIVE

## 2011-07-05 LAB — HEMOGLOBIN A1C
Hgb A1c MFr Bld: 7.1 % — ABNORMAL HIGH (ref <5.7)
Mean Plasma Glucose: 157 mg/dL — ABNORMAL HIGH (ref <117)

## 2011-07-05 SURGERY — APPENDECTOMY, LAPAROSCOPIC
Anesthesia: General | Wound class: Dirty or Infected

## 2011-07-05 MED ORDER — BUPIVACAINE-EPINEPHRINE 0.25% -1:200000 IJ SOLN
INTRAMUSCULAR | Status: DC | PRN
  Administered 2011-07-05: 10 mL

## 2011-07-05 MED ORDER — SODIUM CHLORIDE 0.9 % IV SOLN
1.0000 g | Freq: Every day | INTRAVENOUS | Status: DC
  Administered 2011-07-05: 1 g via INTRAVENOUS
  Filled 2011-07-05 (×2): qty 1

## 2011-07-05 MED ORDER — SILODOSIN 8 MG PO CAPS
8.0000 mg | ORAL_CAPSULE | Freq: Every day | ORAL | Status: DC
  Administered 2011-07-05 – 2011-07-10 (×6): 8 mg via ORAL
  Filled 2011-07-05 (×7): qty 1

## 2011-07-05 MED ORDER — HYDROCODONE-ACETAMINOPHEN 5-325 MG PO TABS
1.0000 | ORAL_TABLET | ORAL | Status: DC | PRN
  Administered 2011-07-05 – 2011-07-09 (×4): 2 via ORAL
  Filled 2011-07-05: qty 2
  Filled 2011-07-05 (×2): qty 1
  Filled 2011-07-05 (×2): qty 2

## 2011-07-05 MED ORDER — PROMETHAZINE HCL 25 MG/ML IJ SOLN: 6.2500 mg | INTRAMUSCULAR | Status: DC | PRN

## 2011-07-05 MED ORDER — MORPHINE SULFATE 2 MG/ML IJ SOLN: 2.0000 mg | INTRAMUSCULAR | Status: DC | PRN

## 2011-07-05 MED ORDER — SODIUM CHLORIDE 0.9 % IV SOLN
INTRAVENOUS | Status: DC | PRN
  Administered 2011-07-05: 04:00:00 via INTRAVENOUS

## 2011-07-05 MED ORDER — SUFENTANIL CITRATE 50 MCG/ML IV SOLN
INTRAVENOUS | Status: DC | PRN
  Administered 2011-07-05: 20 ug via INTRAVENOUS

## 2011-07-05 MED ORDER — INSULIN ASPART 100 UNIT/ML ~~LOC~~ SOLN
0.0000 [IU] | Freq: Three times a day (TID) | SUBCUTANEOUS | Status: DC
  Administered 2011-07-05: 11 [IU] via SUBCUTANEOUS
  Administered 2011-07-05: 4 [IU] via SUBCUTANEOUS
  Administered 2011-07-05: 11 [IU] via SUBCUTANEOUS
  Administered 2011-07-06: 20 [IU] via SUBCUTANEOUS
  Administered 2011-07-06: 11 [IU] via SUBCUTANEOUS
  Administered 2011-07-06: 20 [IU] via SUBCUTANEOUS
  Administered 2011-07-07: 15 [IU] via SUBCUTANEOUS
  Administered 2011-07-07: 2 [IU] via SUBCUTANEOUS
  Administered 2011-07-07: 7 [IU] via SUBCUTANEOUS
  Administered 2011-07-08: 11 [IU] via SUBCUTANEOUS
  Administered 2011-07-08 (×2): 15 [IU] via SUBCUTANEOUS
  Administered 2011-07-09: 7 [IU] via SUBCUTANEOUS
  Administered 2011-07-09: 4 [IU] via SUBCUTANEOUS
  Administered 2011-07-09 – 2011-07-10 (×2): 7 [IU] via SUBCUTANEOUS
  Administered 2011-07-10: 11 [IU] via SUBCUTANEOUS
  Filled 2011-07-05: qty 3

## 2011-07-05 MED ORDER — SODIUM CHLORIDE 0.9 % IV SOLN
1.0000 g | Freq: Every day | INTRAVENOUS | Status: DC
  Administered 2011-07-05 – 2011-07-09 (×5): 1 g via INTRAVENOUS
  Filled 2011-07-05 (×7): qty 1

## 2011-07-05 MED ORDER — ROCURONIUM BROMIDE 100 MG/10ML IV SOLN
INTRAVENOUS | Status: DC | PRN
  Administered 2011-07-05: 30 mg via INTRAVENOUS
  Administered 2011-07-05: 20 mg via INTRAVENOUS

## 2011-07-05 MED ORDER — SUCCINYLCHOLINE CHLORIDE 20 MG/ML IJ SOLN
INTRAMUSCULAR | Status: DC | PRN
  Administered 2011-07-05: 120 mg via INTRAVENOUS

## 2011-07-05 MED ORDER — LOSARTAN POTASSIUM-HCTZ 100-25 MG PO TABS: 1.0000 | ORAL_TABLET | Freq: Every day | ORAL | Status: DC

## 2011-07-05 MED ORDER — GLYCOPYRROLATE 0.2 MG/ML IJ SOLN
INTRAMUSCULAR | Status: DC | PRN
  Administered 2011-07-05: .4 mg via INTRAVENOUS

## 2011-07-05 MED ORDER — CLOTRIMAZOLE 1 % EX CREA
TOPICAL_CREAM | Freq: Two times a day (BID) | CUTANEOUS | Status: DC
  Administered 2011-07-06: 10:00:00 via TOPICAL
  Filled 2011-07-05: qty 15

## 2011-07-05 MED ORDER — HYDROCHLOROTHIAZIDE 25 MG PO TABS
25.0000 mg | ORAL_TABLET | Freq: Every day | ORAL | Status: DC
  Administered 2011-07-05 – 2011-07-10 (×5): 25 mg via ORAL
  Filled 2011-07-05 (×6): qty 1

## 2011-07-05 MED ORDER — PROPOFOL 10 MG/ML IV EMUL
INTRAVENOUS | Status: DC | PRN
  Administered 2011-07-05: 130 mg via INTRAVENOUS

## 2011-07-05 MED ORDER — ALPRAZOLAM 0.5 MG PO TABS
0.5000 mg | ORAL_TABLET | Freq: Every evening | ORAL | Status: DC | PRN
  Administered 2011-07-09: 0.5 mg via ORAL
  Filled 2011-07-05: qty 1

## 2011-07-05 MED ORDER — ACETAMINOPHEN 325 MG PO TABS: 650.0000 mg | ORAL_TABLET | Freq: Four times a day (QID) | ORAL | Status: DC | PRN

## 2011-07-05 MED ORDER — MEPERIDINE HCL 25 MG/ML IJ SOLN: 6.2500 mg | INTRAMUSCULAR | Status: DC | PRN

## 2011-07-05 MED ORDER — POTASSIUM CHLORIDE IN NACL 20-0.9 MEQ/L-% IV SOLN
INTRAVENOUS | Status: DC
  Administered 2011-07-05 – 2011-07-09 (×10): via INTRAVENOUS
  Filled 2011-07-05 (×16): qty 1000

## 2011-07-05 MED ORDER — LACTATED RINGERS IV SOLN
INTRAVENOUS | Status: DC | PRN
  Administered 2011-07-05: 03:00:00 via INTRAVENOUS

## 2011-07-05 MED ORDER — HYDROMORPHONE HCL PF 1 MG/ML IJ SOLN: 0.2500 mg | INTRAMUSCULAR | Status: DC | PRN

## 2011-07-05 MED ORDER — ACETAMINOPHEN 650 MG RE SUPP: 650.0000 mg | Freq: Four times a day (QID) | RECTAL | Status: DC | PRN

## 2011-07-05 MED ORDER — DIPHENHYDRAMINE HCL 12.5 MG/5ML PO ELIX
12.5000 mg | ORAL_SOLUTION | Freq: Four times a day (QID) | ORAL | Status: DC | PRN
  Filled 2011-07-05: qty 10

## 2011-07-05 MED ORDER — ONDANSETRON HCL 4 MG/2ML IJ SOLN
4.0000 mg | Freq: Four times a day (QID) | INTRAMUSCULAR | Status: DC | PRN
  Administered 2011-07-05 – 2011-07-08 (×6): 4 mg via INTRAVENOUS
  Filled 2011-07-05 (×6): qty 2

## 2011-07-05 MED ORDER — INSULIN ASPART 100 UNIT/ML ~~LOC~~ SOLN
SUBCUTANEOUS | Status: DC | PRN
  Administered 2011-07-05: 10 [IU] via SUBCUTANEOUS

## 2011-07-05 MED ORDER — PANTOPRAZOLE SODIUM 40 MG IV SOLR
40.0000 mg | Freq: Every day | INTRAVENOUS | Status: DC
  Administered 2011-07-05 – 2011-07-08 (×4): 40 mg via INTRAVENOUS
  Filled 2011-07-05 (×5): qty 40

## 2011-07-05 MED ORDER — DIPHENHYDRAMINE HCL 50 MG/ML IJ SOLN: 12.5000 mg | Freq: Four times a day (QID) | INTRAMUSCULAR | Status: DC | PRN

## 2011-07-05 MED ORDER — INSULIN GLARGINE 100 UNIT/ML ~~LOC~~ SOLN
5.0000 [IU] | Freq: Every day | SUBCUTANEOUS | Status: DC
  Administered 2011-07-05 – 2011-07-07 (×3): 5 [IU] via SUBCUTANEOUS
  Filled 2011-07-05: qty 3

## 2011-07-05 MED ORDER — SODIUM CHLORIDE 0.9 % IR SOLN
Status: DC | PRN
  Administered 2011-07-05: 1000 mL

## 2011-07-05 MED ORDER — NEOSTIGMINE METHYLSULFATE 1 MG/ML IJ SOLN
INTRAMUSCULAR | Status: DC | PRN
  Administered 2011-07-05: 3 mg via INTRAVENOUS

## 2011-07-05 MED ORDER — LOSARTAN POTASSIUM 50 MG PO TABS
100.0000 mg | ORAL_TABLET | Freq: Every day | ORAL | Status: DC
  Administered 2011-07-05 – 2011-07-10 (×5): 100 mg via ORAL
  Filled 2011-07-05 (×6): qty 2

## 2011-07-05 SURGICAL SUPPLY — 46 items
ADH SKN CLS APL DERMABOND .7 (GAUZE/BANDAGES/DRESSINGS) ×1
APPLIER CLIP ROT 10 11.4 M/L (STAPLE)
APR CLP MED LRG 11.4X10 (STAPLE)
BAG SPEC RTRVL LRG 6X4 10 (ENDOMECHANICALS) ×2
BLADE SURG ROTATE 9660 (MISCELLANEOUS) ×2 IMPLANT
CANISTER SUCTION 2500CC (MISCELLANEOUS) ×2 IMPLANT
CHLORAPREP W/TINT 26ML (MISCELLANEOUS) ×2 IMPLANT
CLIP APPLIE ROT 10 11.4 M/L (STAPLE) IMPLANT
CLOTH BEACON ORANGE TIMEOUT ST (SAFETY) ×2 IMPLANT
COVER SURGICAL LIGHT HANDLE (MISCELLANEOUS) ×2 IMPLANT
CUTTER FLEX LINEAR 45M (STAPLE) ×2 IMPLANT
DERMABOND ADVANCED (GAUZE/BANDAGES/DRESSINGS) ×1
DERMABOND ADVANCED .7 DNX12 (GAUZE/BANDAGES/DRESSINGS) ×1 IMPLANT
DRAPE WARM FLUID 44X44 (DRAPE) IMPLANT
ELECT REM PT RETURN 9FT ADLT (ELECTROSURGICAL) ×2
ELECTRODE REM PT RTRN 9FT ADLT (ELECTROSURGICAL) ×1 IMPLANT
ENDOLOOP SUT PDS II  0 18 (SUTURE)
ENDOLOOP SUT PDS II 0 18 (SUTURE) IMPLANT
GLOVE BIO SURGEON STRL SZ 6 (GLOVE) ×2 IMPLANT
GLOVE BIO SURGEON STRL SZ7.5 (GLOVE) ×1 IMPLANT
GLOVE BIOGEL PI IND STRL 6.5 (GLOVE) ×1 IMPLANT
GLOVE BIOGEL PI IND STRL 7.5 (GLOVE) IMPLANT
GLOVE BIOGEL PI INDICATOR 6.5 (GLOVE) ×1
GLOVE BIOGEL PI INDICATOR 7.5 (GLOVE) ×1
GOWN PREVENTION PLUS XLARGE (GOWN DISPOSABLE) ×1 IMPLANT
GOWN STRL NON-REIN LRG LVL3 (GOWN DISPOSABLE) ×2 IMPLANT
GOWN STRL REIN 2XL LVL4 (GOWN DISPOSABLE) ×2 IMPLANT
KIT BASIN OR (CUSTOM PROCEDURE TRAY) ×2 IMPLANT
KIT ROOM TURNOVER OR (KITS) ×2 IMPLANT
NS IRRIG 1000ML POUR BTL (IV SOLUTION) ×2 IMPLANT
PAD ARMBOARD 7.5X6 YLW CONV (MISCELLANEOUS) ×2 IMPLANT
POUCH SPECIMEN RETRIEVAL 10MM (ENDOMECHANICALS) ×4 IMPLANT
RELOAD STAPLE 45 3.5 BLU ETS (ENDOMECHANICALS) ×1 IMPLANT
RELOAD STAPLE TA45 3.5 REG BLU (ENDOMECHANICALS) ×2 IMPLANT
SCALPEL HARMONIC ACE (MISCELLANEOUS) ×2 IMPLANT
SET IRRIG TUBING LAPAROSCOPIC (IRRIGATION / IRRIGATOR) ×2 IMPLANT
SLEEVE ENDOPATH XCEL 5M (ENDOMECHANICALS) ×2 IMPLANT
SPECIMEN JAR SMALL (MISCELLANEOUS) ×2 IMPLANT
SUT MNCRL AB 4-0 PS2 18 (SUTURE) ×2 IMPLANT
TOWEL OR 17X24 6PK STRL BLUE (TOWEL DISPOSABLE) ×2 IMPLANT
TOWEL OR 17X26 10 PK STRL BLUE (TOWEL DISPOSABLE) ×2 IMPLANT
TRAY FOLEY CATH 14FR (SET/KITS/TRAYS/PACK) ×2 IMPLANT
TRAY LAPAROSCOPIC (CUSTOM PROCEDURE TRAY) ×2 IMPLANT
TROCAR XCEL BLUNT TIP 100MML (ENDOMECHANICALS) ×2 IMPLANT
TROCAR XCEL NON-BLD 5MMX100MML (ENDOMECHANICALS) ×2 IMPLANT
WATER STERILE IRR 1000ML POUR (IV SOLUTION) IMPLANT

## 2011-07-05 NOTE — Anesthesia Postprocedure Evaluation (Signed)
  Anesthesia Post-op Note  Patient: Anthony [name]  Procedure(s) Performed:  APPENDECTOMY LAPAROSCOPIC  Patient Location: PACU  Anesthesia Type: General  Level of Consciousness: sedated  Airway and Oxygen Therapy: Patient Spontanous Breathing and Patient connected to nasal cannula oxygen  Post-op Pain: none  Post-op Assessment: Post-op Vital signs reviewed, Patient's Cardiovascular Status Stable, Respiratory Function Stable, Patent Airway and No signs of Nausea or vomiting  Post-op Vital Signs: stable  Complications: No apparent anesthesia complications

## 2011-07-05 NOTE — H&P (Signed)
Chief Complaint  Patient presents with  . Abdominal Pain    HISTORY: Pt presents with around 48 hours of malaise and fatigue.  He was experiencing a decreased appetite and nausea.  He began complaining of abdominal pain around 24-30 hours ago.  It started in the suprapubic and RLQ area.  Now his abdomen hurts all over.  He denies fever/ chills.  He has started to develop bloating as well.  He has not had diarrhea, but has had urgency, feeling like having a BM would improve his symptoms, but this was not the case.  He has severe pain with exacerbation of his pain with movement or pressure.    Past Medical History  Diagnosis Date  . Type II or unspecified type diabetes mellitus without mention of complication, not stated as uncontrolled   . GERD (gastroesophageal reflux disease)   . HTN (hypertension)   . Anxiety   . Rheumatic fever   . Otitis media   . DKA (diabetic ketoacidoses) 01/2010    Past Surgical History  Procedure Date  . Vasectomy   . Cholecystectomy   . Wisdom tooth extraction     Current Facility-Administered Medications  Medication Dose Route Frequency Provider Last Rate Last Dose  . 0.9 %  sodium chloride infusion   Intravenous Once Demetrius Charity, PA 100 mL/hr at 07/04/11 2100    . ertapenem (INVANZ) 1 g in sodium chloride 0.9 % 50 mL IVPB  1 g Intravenous QHS Jennifer A Pitylak, PA   1 g at 07/05/11 0102  . iohexol (OMNIPAQUE) 300 MG/ML injection 100 mL  100 mL Intravenous Once PRN Medication Radiologist   100 mL at 07/04/11 2301  . morphine 4 MG/ML injection 4 mg  4 mg Intravenous Once [employer], PA   4 mg at 07/04/11 2100  . morphine 4 MG/ML injection 4 mg  4 mg Intravenous Once [employer], PA      . ondansetron (ZOFRAN) injection 4 mg  4 mg Intravenous Once [employer], PA      . promethazine (PHENERGAN) injection 12.5 mg  12.5 mg Intravenous Once Christian M Buettner, PHARMD   12.5 mg at 07/04/11 2145  . DISCONTD: promethazine  (PHENERGAN) injection 12.5 mg  12.5 mg Intravenous Once Demetrius Charity, PA       Current Outpatient Prescriptions  Medication Sig Dispense Refill  . insulin aspart (NOVOLOG) 100 UNIT/ML injection Inject 11-15 Units into the skin 3 (three) times daily before meals. Use 11 units at breakfast, and 15 units at lunch and dinner       . LEVEMIR FLEXPEN 100 UNIT/ML injection INJECT 55 UNITS AT BEDTIME  1 Syringe  6  . losartan-hydrochlorothiazide (HYZAAR) 100-25 MG per tablet take 1 tablet by mouth once daily  30 tablet  4  . pantoprazole (PROTONIX) 40 MG tablet TAKE 1 TABLET BY MOUTH ONCE DAILY  30 tablet  1  . RAPAFLO 8 MG CAPS capsule take 1 capsule by mouth once daily  30 capsule  11  . simvastatin (ZOCOR) 20 MG tablet TAKE 1 TABLET BY MOUTH EVERY EVENING  30 tablet  4  . ALPRAZolam (XANAX) 0.5 MG tablet Take 0.5 mg by mouth at bedtime as needed. For anxiety       . B-D ULTRAFINE III SHORT PEN 31G X 8 MM MISC USE AS DIRECTED  100 each  5  . NOVOLOG FLEXPEN 100 UNIT/ML injection USE AS DIRECTED BEFORE MEALS  1 Syringe  6  .  ONE TOUCH ULTRA TEST test strip TEST BLOOD SUGAR 5 TIMES A DAY OR AS DIRECTED BY DOCTOR  450 each  3  . sildenafil (VIAGRA) 100 MG tablet Take 100 mg by mouth daily as needed.           No Known Allergies   Family History  Problem Relation Age of Onset  . Lymphoma Mother     Non-hodgkins  . Coronary artery disease Mother   . Heart disease Mother   . Cancer Father   . Alcohol abuse Father   . Diabetes Brother   . Pancreatic cancer Maternal Uncle   . Colon cancer Neg Hx   . Lung cancer Neg Hx   . Diabetes Brother   . Diabetes Brother   . Diabetes Other      History   Social History  . Marital Status: Married    Spouse Name: N/A    Number of Children: N/A  . Years of Education: N/A   Social History Main Topics  . Smoking status: Current Everyday Smoker  . Smokeless tobacco: None   Comment: 4 daily  . Alcohol Use: No  . Drug Use: No  . Sexually  Active: None   Other Topics Concern  . None   Social History Narrative   MD ROSTER:GI - M. 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 Is thinking of fully retiring (02/2010)Married [id number] daughters 23, 29; 1 son [phone] grandchildrenHobby: car restoration: has '66 Vette, two classic Chevelle SS's and is restoring a '37 chevy coupRegular Exercise -  NODaily Caffeine Use:  2     REVIEW OF SYSTEMS - PERTINENT POSITIVES ONLY: 12 point review of systems negative other than HPI and PMH.  EXAM: Filed Vitals:   07/05/11 0049  BP: 152/87  Pulse:   Temp: 98.4 F (36.9 C)  Resp: 24    Gen:  Looks uncomfortable.  Well nourished and well groomed.  Sleepy, but arousable Neurological: Alert and oriented to person, place, and time once stimulated. Coordination normal.  Head: Normocephalic and atraumatic.  Eyes: Conjunctivae are normal. Pupils are equal, round, and reactive to light. No scleral icterus.  Neck: Normal range of motion. Neck supple. No tracheal deviation or thyromegaly present.  Cardiovascular: Tachycardic, regular rhythm, normal heart sounds and intact distal pulses.  Exam reveals no gallop and no friction rub.  No murmur heard. Respiratory: Effort normal.  No respiratory distress. No chest wall tenderness. Breath sounds normal.  No wheezes, rales or rhonchi.  GI: Soft. Bowel sounds are decreased. The abdomen is soft, but tender diffusely.  The tenderness is worse in the RLQ.  There is rebound and voluntary guarding. There is a positive Rovsing's sign. Musculoskeletal: Normal range of motion. Extremities are nontender.  Lymphadenopathy: No cervical, preauricular, postauricular or axillary adenopathy is present Skin: Skin is warm and dry. No rash noted. No diaphoresis. No erythema. No pallor. No clubbing, cyanosis, or edema.   Psychiatric: Normal mood and affect. Behavior is normal. Judgment and thought content normal.    LABORATORY  RESULTS: Available labs are reviewed  K 3.3, Glucose 271, u/a Glu >1000 and ketones >80, WBCs 16.3   RADIOLOGY RESULTS: CT abd/pelvis:    IMPRESSION:  Acute appendicitis. Evidence of rupture, with a few locules of  extraluminal gas.    ASSESSMENT AND PLAN:   Patient Active Hospital Problem List:  Appendicitis with perforation (07/05/2011)    POA: Unknown   Assessment: Likely rupture present based on locules of  air adjacent to appendix and physical exam findings.     Plan: IV Fluids, laparoscopic appendectomy, IV antibiotics, pain control, NPO  DIABETES MELLITUS, TYPE II (07/24/2007)       Assessment: Glucose high in 200s   Plan: sliding scale insulin, may need medicine consult if sugars remain difficult to control    HYPERTENSION (07/24/2007)    POA: Yes   Home meds as needed.      Maudry Diego MD Surgical Oncology, General and Endocrine Surgery [facility]      Visit Diagnoses: No diagnosis found.  Primary Care Physician: Illene Regulus, MD, MD

## 2011-07-05 NOTE — Progress Notes (Addendum)
Inpatient Diabetes Program Recommendations  AACE/ADA: New Consensus Statement on Inpatient Glycemic Control (2009)  Target Ranges:  Prepandial:   less than 140 mg/dL      Peak postprandial:   less than 180 mg/dL (1-2 hours)      Critically ill patients:  140 - 180 mg/dL   Reason for Visit: CBG's today are 336 and 273 mg/dL  Inpatient Diabetes Program Recommendations Insulin - Basal: Note patients home dose of Levemir was 55 units according to H&P.  Please consider discontinuation of Lantus and add Levemir 40 units daily to meet basal insulin needs.  Insulin - Meal Coverage: Consider adding Novolog meal coverage once diet started.  Consider Novolog 4 units tid with meals (Hold if pt eats less than 50%) HgbA1C: pending  Note:

## 2011-07-05 NOTE — Anesthesia Procedure Notes (Signed)
Performed by: Jermon Chalfant     

## 2011-07-05 NOTE — Progress Notes (Signed)
Day of Surgery  Subjective: Pt c/o soreness.  Tolerated clears this am.  No nausea currently.  Objective: Vital signs in last 24 hours: Temp:  [97.8 F (36.6 C)-98.7 F (37.1 C)] 97.9 F (36.6 C) (11/07 0800) Pulse Rate:  [69-105] 92  (11/07 0800) Resp:  [16-24] 17  (11/07 0800) BP: (138-153)/(70-104) 153/75 mmHg (11/07 0800) SpO2:  [93 %-97 %] 97 % (11/07 0800) Weight:  [208 lb 15.9 oz (94.8 kg)-210 lb (95.255 kg)] 208 lb 15.9 oz (94.8 kg) (11/07 0530) Last BM Date: 07/02/11  Intake/Output from previous day: 11/06 0701 - 11/07 0700 In: 1200 [I.V.:1200] Out: 300 [Urine:300] Intake/Output this shift:    PE: Abd: soft, appropriately tender, absent BS, mild distention, incisions are C/D/I with dermabond HT: regular Lungs: CTAB  Lab Results:   [facility] 07/05/11 0615 07/04/11 2133  WBC 20.1* 16.3*  HGB 14.9 15.3  HCT 42.3 42.6  PLT 177 188   BMET  Basename 07/05/11 0615 07/04/11 2133  NA 138 134*  K 3.3* 3.3*  CL 102 97  CO2 20 23  GLUCOSE 273* 271*  BUN 13 13  CREATININE 0.80 0.77  CALCIUM 8.7 8.9   PT/INR No results found for this basename: LABPROT:2,INR:2 in the last 72 hours   Studies/Results: @RISRSLT2 @  Anti-infectives: Anti-infectives     Start     Dose/Rate Route Frequency Ordered Stop   07/05/11 0030   ertapenem (INVANZ) 1 g in sodium chloride 0.9 % 50 mL IVPB  Status:  Discontinued        1 g 100 mL/hr over 30 Minutes Intravenous Daily at bedtime 07/05/11 0022 07/05/11 0224           Assessment/Plan  1. Acute perforated appendicitis 2. S/p lap appy 3. DM 4. HTN  Plan: 1 d/c foley on POD 1 2. Transfer to SDU on POD 1 3. Cont clears for now, suspect pt may develop ileus 4. Mobilize and get up to chair today 5. Pulm toilet 6. Consult Dr. Debby Bud for assistance with medical problems   LOS: 1 day    OSBORNE,KELLY E 07/05/2011

## 2011-07-05 NOTE — Interval H&P Note (Signed)
History and Physical Interval Note:   07/05/2011   3:16 AM   [name]  has presented today for surgery, with the diagnosis of acute appendicitis  The various methods of treatment have been discussed with the patient and family. After consideration of risks, benefits and other options for treatment, the patient has consented to  Procedure(s): APPENDECTOMY LAPAROSCOPIC as a surgical intervention .  The patients' history has been reviewed, patient examined, no change in status, stable for surgery.  I have reviewed the patients' chart and labs.  Questions were answered to the patient's satisfaction.     Jese Comella  MD       No change since H&P

## 2011-07-05 NOTE — Op Note (Signed)
Appendectomy, Lap, Procedure Note  Indications: The patient presented with a history of right-sided abdominal pain. A CT scan revealed findings consistent with acute appendicitis.  Pre-operative Diagnosis: Acute appendicitis with rupture and abscess  Post-operative Diagnosis: Acute appendicitis with rupture and abscess  Surgeon: Almond Lint   Assistants: none Anesthesia: General endotracheal anesthesia  ASA Class: 3E   Procedure Details  The patient was seen again in the Holding Room. The risks, benefits, complications, treatment options, and expected outcomes were discussed with the patient and/or family. There was concurrence with the proposed plan and informed consent was obtained. The site of surgery was properly noted. The patient was taken to Operating Room, identified, and the procedure verified as laparoscopic appendectomy.  The patient was placed in the supine position and general anesthesia was induced, along with placement of orogastric tube, pneumatic compression devices and a Foley catheter. The abdomen was prepped and draped in a sterile fashion.   A Time Out was held and the above information confirmed.Local anesthetic was administered to the subcutaneous tissues.   A transverse 1.5 cm infraumbilical incision was made and the subcutaneous tissues were spread with a Kelly clamp. The umbilical stalk was elevated with 2 Kocher clamps.  The midline fascia was incised with the #11 blade.  Entrance into the peritoneal cavity was confirmed with a Tresa Endo.  A pursestring suture was placed around the fascial incision.  The Hasson trocar was inserted into the abdomen and held in place to the abdominal wall with the tails of the suture.  The pneumoperitoneum was then established to steady pressure of 15 mmHg.   Two additional 5 mm trocars were then placed in the left lower quadrant of the abdomen and in the suprapubic region under direct vision after administration of local.  A careful  evaluation of the entire abdomen was carried out. The patient was placed in Trendelenburg and rotated to the left. The small intestines were retracted in the cephalad and left lateral direction away from the pelvis and right lower quadrant. The patient was found to have an enlarged and inflamed appendix that was extending into the pelvis. The appendix was necrotic and surrounded by purulent fluid.    The appendix was carefully dissected, but did break into two pieces due to the gangrenous nature of the appendix.   The harmonic scalpel was used to skeletonize the base of the appendix and to divide the mesoappendix. The appendix was divided at its base using an endo-GIA stapler, blue load. Minimal appendiceal stump was left in place. There was no evidence of bleeding, leakage, or complication after division of the appendix. The appendiceal fragments were retrieved from the abdomen with an endocatch bag.  Irrigation was also performed and irrigate suctioned from the abdomen as well.  The umbilical port site was closed using the fascial purse string suture.  There was no residual palpable fascial defect. The trocar site skin wounds were closed using 4-0 Monocryl.    The patient was awakened from anesthesia and taken to the PACU in stable condition.    Instrument, sponge, and needle counts were correct at the conclusion of the case.   Findings: The appendix was found to be inflamed. There were signs of necrosis.  There was perforation. There was abscess formation.  Estimated Blood Loss:  Minimal          Specimens: Appendix        Complications:  None; patient tolerated the procedure well.         Disposition:  PACU - hemodynamically stable.

## 2011-07-05 NOTE — Transfer of Care (Signed)
Immediate Anesthesia Transfer of Care Note  Patient: Anthony [name]  Procedure(s) Performed:  APPENDECTOMY LAPAROSCOPIC  Patient Location: PACU  Anesthesia Type: General  Level of Consciousness: sedated  Airway & Oxygen Therapy: Patient Spontanous Breathing and Patient connected to nasal cannula oxygen  Post-op Assessment: Report given to PACU RN  Post vital signs: Reviewed and stable  Complications: No apparent anesthesia complications

## 2011-07-05 NOTE — Anesthesia Preprocedure Evaluation (Addendum)
Anesthesia Evaluation  Patient identified by MRN, date of birth, ID band Patient awake    Reviewed: Allergy & Precautions, H&P , NPO status , Patient's Chart, lab work & pertinent test results  Airway Mallampati: III TM Distance: >3 FB Neck ROM: Full    Dental  (+) Teeth Intact   Pulmonary    Pulmonary exam normal       Cardiovascular hypertension, Pt. on medications     Neuro/Psych    GI/Hepatic   Endo/Other  Diabetes mellitus-, Type 1, Insulin DependentMorbid obesity  Renal/GU      Musculoskeletal   Abdominal   Peds  Hematology   Anesthesia Other Findings   Reproductive/Obstetrics                          Anesthesia Physical Anesthesia Plan  ASA: III and Emergent  Anesthesia Plan: General   Post-op Pain Management:    Induction: Intravenous, Rapid sequence and Cricoid pressure planned  Airway Management Planned: Oral ETT  Additional Equipment:   Intra-op Plan:   Post-operative Plan: Extubation in OR  Informed Consent: I have reviewed the patients History and Physical, chart, labs and discussed the procedure including the risks, benefits and alternatives for the proposed anesthesia with the patient or authorized representative who has indicated his/her understanding and acceptance.   Dental advisory given  Plan Discussed with: CRNA  Anesthesia Plan Comments:         Anesthesia Quick Evaluation

## 2011-07-05 NOTE — ED Provider Notes (Signed)
Medical screening examination/treatment/procedure(s) were performed by non-physician practitioner and as supervising physician I was immediately available for consultation/collaboration.  Glynn Octave, MD 07/05/11 [phone]

## 2011-07-05 NOTE — ED Notes (Signed)
C/O abd pain located in LLQ.  States the pain started Sunday and has gotten progressively worse

## 2011-07-06 LAB — CBC
HCT: 45.3 % (ref 39.0–52.0)
MCHC: 34.2 g/dL (ref 30.0–36.0)
Platelets: 210 10*3/uL (ref 150–400)
RDW: 13.1 % (ref 11.5–15.5)
WBC: 19.2 10*3/uL — ABNORMAL HIGH (ref 4.0–10.5)

## 2011-07-06 LAB — BASIC METABOLIC PANEL
BUN: 20 mg/dL (ref 6–23)
GFR calc Af Amer: >90 mL/min (ref >90)
GFR calc non Af Amer: >90 mL/min (ref >90)
Potassium: 4.1 mEq/L (ref 3.5–5.1)
Sodium: 133 mEq/L — ABNORMAL LOW (ref 135–145)

## 2011-07-06 LAB — URINE CULTURE: Culture  Setup Time: 201211070917

## 2011-07-06 LAB — GLUCOSE, CAPILLARY
Glucose-Capillary: 399 mg/dL — ABNORMAL HIGH (ref 70–99)
Glucose-Capillary: 272 mg/dL — ABNORMAL HIGH (ref 70–99)

## 2011-07-06 MED ORDER — METOPROLOL TARTRATE 1 MG/ML IV SOLN
2.5000 mg | INTRAVENOUS | Status: DC | PRN
  Filled 2011-07-06: qty 5

## 2011-07-06 NOTE — Progress Notes (Addendum)
Inpatient Diabetes Program Recommendations  AACE/ADA: New Consensus Statement on Inpatient Glycemic Control (2009)  Target Ranges:  Prepandial:   less than 140 mg/dL      Peak postprandial:   less than 180 mg/dL (1-2 hours)      Critically ill patients:  140 - 180 mg/dL   Reason for Visit: CBG this morning is 399 mg/dL  Inpatient Diabetes Program Recommendations Insulin - Basal: Note patients home dose of Levemir was 55 units according to H&P.  Please consider discontinuation of Lantus and add Levemir 40 units daily to meet basal insulin needs.  Insulin - Meal Coverage: Consider adding Novolog meal coverage once diet started.  Consider Novolog 4 units tid with meals (Hold if pt eats less than 50%) HgbA1C: A1c=7.1% indicating good control of diabetes prior to admit.  Note: Please increase basal insulin or consider IV insulin for improved glycemic control.

## 2011-07-06 NOTE — Progress Notes (Signed)
LOS: 2 days   Subjective: Pain controlled enough that it takes the edge off. Very little flatus. Just had 1 e/o emesis and feels better.   Objective: Vital signs in last 24 hours: Temp:  [97.3 F (36.3 C)-99.1 F (37.3 C)] 98.3 F (36.8 C) (11/08 0800) Pulse Rate:  [81-109] 92  (11/08 0800) Resp:  [17-26] 19  (11/08 0800) BP: (125-166)/(54-87) 133/54 mmHg (11/08 0800) SpO2:  [93 %-97 %] 96 % (11/08 0800) Weight:  [93.8 kg (206 lb 12.7 oz)] 206 lb 12.7 oz (93.8 kg) (11/08 0400) Last BM Date: 07/02/11  Lab Results:  CBC  Basename 07/06/11 0345 07/05/11 0615  WBC 19.2* 20.1*  HGB 15.5 14.9  HCT 45.3 42.3  PLT 210 177   BMET  Basename 07/06/11 0345 07/05/11 0615  NA 133* 138  K 4.1 3.3*  CL 98 102  CO2 11* 20  GLUCOSE 360* 273*  BUN 20 13  CREATININE 0.81 0.80  CALCIUM 9.3 8.7    General appearance: alert and diaphoretic Resp: clear to auscultation bilaterally Cardio: regular rate and rhythm GI: normal findings: bowel sounds normal and abnormal findings:  appropriate TTP  Assessment/Plan: S/p lap appy secondary to perf appy Mild ileus -- Continue clears as tolerated. Encourage ambulation Multiple med probs -- per IM. Appreciate consult Dispo -- to 3300   Freeman Caldron, PA-C Pager: [phone] General Trauma PA Pager: [phone]   07/06/2011

## 2011-07-06 NOTE — Consult Note (Signed)
Reason for Consult:Ask to assist with management of diabetes Referring Physician: Dr. Leilani Merl is an 66 y.o. male.  HPI: Mr. Lederman presented to surgeon with acute abdominal pain, diagnosed with acute appendictis.He was taken for appendectomy and was found to have ruptured appendix with peritonitis. He underwent successful appendectomy and has been assigned tot he surgical ICU  Past Medical History  Diagnosis Date  . Type II or unspecified type diabetes mellitus without mention of complication, not stated as uncontrolled   . GERD (gastroesophageal reflux disease)   . HTN (hypertension)   . Anxiety   . Rheumatic fever   . Otitis media   . DKA (diabetic ketoacidoses) 01/2010    Past Surgical History  Procedure Date  . Vasectomy   . Cholecystectomy   . Wisdom tooth extraction     Family History  Problem Relation Age of Onset  . Lymphoma Mother     Non-hodgkins  . Coronary artery disease Mother   . Heart disease Mother   . Cancer Father   . Alcohol abuse Father   . Diabetes Brother   . Pancreatic cancer Maternal Uncle   . Colon cancer Neg Hx   . Lung cancer Neg Hx   . Diabetes Brother   . Diabetes Brother   . Diabetes Other     Social History:  reports that he has been smoking.  He does not have any smokeless tobacco history on file. He reports that he does not drink alcohol or use illicit drugs.  Allergies: No Known Allergies  Medications: I have reviewed the patient's current medications.  Results for orders placed during the hospital encounter of 07/04/11 (from the past 48 hour(s))  GLUCOSE, CAPILLARY     Status: Abnormal   Collection Time   07/04/11  8:20 PM      Component Value Range Comment   Glucose-Capillary 236 (*) 70 - 99 (mg/dL)   LACTIC ACID, PLASMA     Status: Normal   Collection Time   07/04/11  8:55 PM      Component Value Range Comment   Lactic Acid, Venous 1.3  0.5 - 2.2 (mmol/L)   CBC     Status: Abnormal   Collection Time   07/04/11  9:33 PM      Component Value Range Comment   WBC 16.3 (*) 4.0 - 10.5 (K/uL)    RBC 4.91  4.22 - 5.81 (MIL/uL)    Hemoglobin 15.3  13.0 - 17.0 (g/dL)    HCT 40.9  81.1 - 91.4 (%)    MCV 86.8  78.0 - 100.0 (fL)    MCH 31.2  26.0 - 34.0 (pg)    MCHC 35.9  30.0 - 36.0 (g/dL)    RDW 78.2  95.6 - 21.3 (%)    Platelets 188  150 - 400 (K/uL)   DIFFERENTIAL     Status: Abnormal   Collection Time   07/04/11  9:33 PM      Component Value Range Comment   Neutrophils Relative 91 (*) 43 - 77 (%)    Neutro Abs 14.9 (*) 1.7 - 7.7 (K/uL)    Lymphocytes Relative 5 (*) 12 - 46 (%)    Lymphs Abs 0.8  0.7 - 4.0 (K/uL)    Monocytes Relative 4  3 - 12 (%)    Monocytes Absolute 0.6  0.1 - 1.0 (K/uL)    Eosinophils Relative 0  0 - 5 (%)    Eosinophils Absolute 0.0  0.0 -  0.7 (K/uL)    Basophils Relative 0  0 - 1 (%)    Basophils Absolute 0.0  0.0 - 0.1 (K/uL)   COMPREHENSIVE METABOLIC PANEL     Status: Abnormal   Collection Time   07/04/11  9:33 PM      Component Value Range Comment   Sodium 134 (*) 135 - 145 (mEq/L)    Potassium 3.3 (*) 3.5 - 5.1 (mEq/L)    Chloride 97  96 - 112 (mEq/L)    CO2 23  19 - 32 (mEq/L)    Glucose, Bld 271 (*) 70 - 99 (mg/dL)    BUN 13  6 - 23 (mg/dL)    Creatinine, Ser 9.14  0.50 - 1.35 (mg/dL)    Calcium 8.9  8.4 - 10.5 (mg/dL)    Total Protein 6.4  6.0 - 8.3 (g/dL)    Albumin 3.6  3.5 - 5.2 (g/dL)    AST 11  0 - 37 (U/L)    ALT 12  0 - 53 (U/L)    Alkaline Phosphatase 102  39 - 117 (U/L)    Total Bilirubin 1.3 (*) 0.3 - 1.2 (mg/dL)    GFR calc non Af Amer >90  >90 (mL/min)    GFR calc Af Amer >90  >90 (mL/min)   LIPASE, BLOOD     Status: Abnormal   Collection Time   07/04/11  9:33 PM      Component Value Range Comment   Lipase 9 (*) 11 - 59 (U/L)   POCT I-STAT TROPONIN I     Status: Normal   Collection Time   07/04/11 10:05 PM      Component Value Range Comment   Troponin i, poc 0.00  0.00 - 0.08 (ng/mL)    Comment 3            URINALYSIS, ROUTINE W  REFLEX MICROSCOPIC     Status: Abnormal   Collection Time   07/04/11 11:03 PM      Component Value Range Comment   Color, Urine YELLOW  YELLOW     Appearance CLEAR  CLEAR     Specific Gravity, Urine 1.025  1.005 - 1.030     pH 5.0  5.0 - 8.0     Glucose, UA >1000 (*) NEGATIVE (mg/dL)    Hgb urine dipstick TRACE (*) NEGATIVE     Bilirubin Urine NEGATIVE  NEGATIVE     Ketones, ur >80 (*) NEGATIVE (mg/dL)    Protein, ur NEGATIVE  NEGATIVE (mg/dL)    Urobilinogen, UA 0.2  0.0 - 1.0 (mg/dL)    Nitrite NEGATIVE  NEGATIVE     Leukocytes, UA NEGATIVE  NEGATIVE    URINE MICROSCOPIC-ADD ON     Status: Normal   Collection Time   07/04/11 11:03 PM      Component Value Range Comment   RBC / HPF 0-2  <3 (RBC/hpf)    Bacteria, UA RARE  RARE     Urine-Other MUCOUS PRESENT     GLUCOSE, CAPILLARY     Status: Abnormal   Collection Time   07/05/11  4:36 AM      Component Value Range Comment   Glucose-Capillary 336 (*) 70 - 99 (mg/dL)   MRSA PCR SCREENING     Status: Normal   Collection Time   07/05/11  5:30 AM      Component Value Range Comment   MRSA by PCR NEGATIVE  NEGATIVE    BASIC METABOLIC PANEL     Status:  Abnormal   Collection Time   07/05/11  6:15 AM      Component Value Range Comment   Sodium 138  135 - 145 (mEq/L)    Potassium 3.3 (*) 3.5 - 5.1 (mEq/L)    Chloride 102  96 - 112 (mEq/L)    CO2 20  19 - 32 (mEq/L)    Glucose, Bld 273 (*) 70 - 99 (mg/dL)    BUN 13  6 - 23 (mg/dL)    Creatinine, Ser 9.14  0.50 - 1.35 (mg/dL)    Calcium 8.7  8.4 - 10.5 (mg/dL)    GFR calc non Af Amer >90  >90 (mL/min)    GFR calc Af Amer >90  >90 (mL/min)   MAGNESIUM     Status: Normal   Collection Time   07/05/11  6:15 AM      Component Value Range Comment   Magnesium 1.6  1.5 - 2.5 (mg/dL)   PHOSPHORUS     Status: Normal   Collection Time   07/05/11  6:15 AM      Component Value Range Comment   Phosphorus 3.7  2.3 - 4.6 (mg/dL)   CBC     Status: Abnormal   Collection Time   07/05/11  6:15 AM       Component Value Range Comment   WBC 20.1 (*) 4.0 - 10.5 (K/uL)    RBC 4.82  4.22 - 5.81 (MIL/uL)    Hemoglobin 14.9  13.0 - 17.0 (g/dL)    HCT 78.2  95.6 - 21.3 (%)    MCV 87.8  78.0 - 100.0 (fL)    MCH 30.9  26.0 - 34.0 (pg)    MCHC 35.2  30.0 - 36.0 (g/dL)    RDW 08.6  57.8 - 46.9 (%)    Platelets 177  150 - 400 (K/uL)   HEMOGLOBIN A1C     Status: Abnormal   Collection Time   07/05/11  6:15 AM      Component Value Range Comment   Hemoglobin A1C 7.1 (*) <5.7 (%)    Mean Plasma Glucose 157 (*) <117 (mg/dL)   GLUCOSE, CAPILLARY     Status: Abnormal   Collection Time   07/05/11  7:35 AM      Component Value Range Comment   Glucose-Capillary 257 (*) 70 - 99 (mg/dL)   GLUCOSE, CAPILLARY     Status: Abnormal   Collection Time   07/05/11 11:56 AM      Component Value Range Comment   Glucose-Capillary 190 (*) 70 - 99 (mg/dL)   CBC     Status: Abnormal   Collection Time   07/06/11  3:45 AM      Component Value Range Comment   WBC 19.2 (*) 4.0 - 10.5 (K/uL)    RBC 5.03  4.22 - 5.81 (MIL/uL)    Hemoglobin 15.5  13.0 - 17.0 (g/dL)    HCT 62.9  52.8 - 41.3 (%)    MCV 90.1  78.0 - 100.0 (fL)    MCH 30.8  26.0 - 34.0 (pg)    MCHC 34.2  30.0 - 36.0 (g/dL)    RDW 24.4  01.0 - 27.2 (%)    Platelets 210  150 - 400 (K/uL)   BASIC METABOLIC PANEL     Status: Abnormal   Collection Time   07/06/11  3:45 AM      Component Value Range Comment   Sodium 133 (*) 135 - 145 (mEq/L)    Potassium 4.1  3.5 - 5.1 (mEq/L) NO VISIBLE HEMOLYSIS   Chloride 98  96 - 112 (mEq/L)    CO2 11 (*) 19 - 32 (mEq/L)    Glucose, Bld 360 (*) 70 - 99 (mg/dL)    BUN 20  6 - 23 (mg/dL)    Creatinine, Ser 1.61  0.50 - 1.35 (mg/dL)    Calcium 9.3  8.4 - 10.5 (mg/dL)    GFR calc non Af Amer >90  >90 (mL/min)    GFR calc Af Amer >90  >90 (mL/min)     Ct Abdomen Pelvis W Contrast  07/04/2011  *RADIOLOGY REPORT*  Clinical Data: Lower abdominal pain.  CT ABDOMEN AND PELVIS WITH CONTRAST  Technique:  Multidetector CT  imaging of the abdomen and pelvis was performed following the standard protocol during bolus administration of intravenous contrast.  Contrast: OMNIPAQUE IOHEXOL 300 MG/ML IV SOLN  Comparison: 01/29/2010 radiograph  Findings: Cardiomegaly.  Bibasilar scarring versus atelectasis.  Low attenuation of the liver suggests fatty infiltration. Subcentimeter hypodensity at the dome is likely a biliary cyst or hamartoma.  Status post cholecystectomy.  No biliary ductal dilatation.  Unremarkable spleen, pancreas, adrenal glands. A couple left renal hypodensities are too small to further characterize.  Otherwise symmetric renal enhancement.  There is mild bilateral nonspecific perinephric fat stranding.  No hydronephrosis or hydroureter.  No bowel obstruction.  No CT evidence for colitis. Cecal diverticulum.  The appendix is distended, measuring 9 mm.  There is an appendicolith at the base.  There are a couple locules of extraluminal air and moderate amount of periappendiceal fat stranding.  No free intraperitoneal fluid.  No free intraperitoneal air elsewhere.  No lymphadenopathy. There are a couple subcentimeter lymph nodes within the mesorectal fat, nonspecific.  Thin-walled bladder.  Enlarged prostate gland.  Bilateral fat containing inguinal hernias.  There is scattered atherosclerotic calcification of the aorta and its branches. No aneurysmal dilatation.  Multilevel degenerative changes of the imaged spine. No acute or aggressive appearing osseous lesion.  IMPRESSION: Acute appendicitis. Evidence of rupture, with a few locules of extraluminal gas.  Original Report Authenticated By: Waneta Martins, M.D.    ROS Blood pressure 136/66, pulse 107, temperature 97.3 F (36.3 C), temperature source Oral, resp. rate 22, height 5' 11.5" (1.816 m), weight 206 lb 12.7 oz (93.8 kg), SpO2 96.00%. Physical Exam Awake and alert and recognizes the the examiner Chest - no labored breathing, no rales or wheezing Cor -  RRR, 2+ radial pulse Abdomen- no bowel sounds, no rebound, tender Neuro- AO x 3  Assessment/Plan:  DIABETES MELLITUS, TYPE II (07/24/2007)   Assessment: CBGs are running high. As an outpatient he was difficult to control.  Lab Results  Component Value Date   HGBA1C 7.1* 07/05/2011      Plan: continue sliding scale coverage until taking a diet.  HYPERTENSION (07/24/2007)   Assessment: BP is stable at this time    Plan: PRN control using lopressor 2.5 mg IV q 4 for SBP > 160  Appendicitis with perforation (07/05/2011)   Assessment: post op 3#   Plan: Per surgery    Illene Regulus 07/06/2011, 7:50 AM

## 2011-07-07 LAB — BASIC METABOLIC PANEL
Calcium: 8.8 mg/dL (ref 8.4–10.5)
Chloride: 106 mEq/L (ref 96–112)
Creatinine, Ser: 0.77 mg/dL (ref 0.50–1.35)
GFR calc Af Amer: >90 mL/min (ref >90)
Sodium: 141 mEq/L (ref 135–145)

## 2011-07-07 LAB — GLUCOSE, CAPILLARY: Glucose-Capillary: 173 mg/dL — ABNORMAL HIGH (ref 70–99)

## 2011-07-07 LAB — CBC
Platelets: 236 10*3/uL (ref 150–400)
RDW: 13.2 % (ref 11.5–15.5)
WBC: 17.3 10*3/uL — ABNORMAL HIGH (ref 4.0–10.5)

## 2011-07-07 MED ORDER — PNEUMOCOCCAL VAC POLYVALENT 25 MCG/0.5ML IJ INJ
0.5000 mL | INJECTION | INTRAMUSCULAR | Status: AC
  Administered 2011-07-08: 0.5 mL via INTRAMUSCULAR
  Filled 2011-07-07: qty 0.5

## 2011-07-07 MED ORDER — ALUM & MAG HYDROXIDE-SIMETH 200-200-20 MG/5ML PO SUSP: 30.0000 mL | Freq: Four times a day (QID) | ORAL | Status: DC | PRN

## 2011-07-07 MED ORDER — MORPHINE SULFATE (PF) 1 MG/ML IV SOLN
INTRAVENOUS | Status: DC
  Administered 2011-07-07: 9 mg via INTRAVENOUS
  Administered 2011-07-07: 12 mg via INTRAVENOUS
  Administered 2011-07-07 (×2): via INTRAVENOUS
  Administered 2011-07-08: 6.68 mg via INTRAVENOUS
  Administered 2011-07-08: 1.5 mg via INTRAVENOUS
  Administered 2011-07-08: 4.5 mg via INTRAVENOUS
  Administered 2011-07-08: 1.5 mg via INTRAVENOUS
  Administered 2011-07-08: 1.5 mL via INTRAVENOUS
  Administered 2011-07-09: 1.5 mg via INTRAVENOUS
  Filled 2011-07-07 (×2): qty 50
  Filled 2011-07-07: qty 30

## 2011-07-07 MED ORDER — MAGIC MOUTHWASH
15.0000 mL | Freq: Four times a day (QID) | ORAL | Status: DC | PRN
  Filled 2011-07-07: qty 15

## 2011-07-07 MED ORDER — PROMETHAZINE HCL 25 MG/ML IJ SOLN: 6.2500 mg | Freq: Four times a day (QID) | INTRAMUSCULAR | Status: DC | PRN

## 2011-07-07 MED ORDER — ALUM & MAG HYDROXIDE-SIMETH 400-400-40 MG/5ML PO SUSP
30.0000 mL | Freq: Four times a day (QID) | ORAL | Status: DC | PRN
  Filled 2011-07-07: qty 30

## 2011-07-07 MED ORDER — INFLUENZA VAC TYP A&B SURF ANT IM INJ
0.5000 mL | INJECTION | INTRAMUSCULAR | Status: AC
  Administered 2011-07-08: 0.5 mL via INTRAMUSCULAR
  Filled 2011-07-07: qty 0.5

## 2011-07-07 MED ORDER — METOCLOPRAMIDE HCL 5 MG/ML IJ SOLN
10.0000 mg | Freq: Four times a day (QID) | INTRAMUSCULAR | Status: AC
  Administered 2011-07-08 – 2011-07-09 (×7): 10 mg via INTRAVENOUS
  Filled 2011-07-07 (×10): qty 2

## 2011-07-07 MED ORDER — LIP MEDEX EX OINT
1.0000 "application " | TOPICAL_OINTMENT | Freq: Two times a day (BID) | CUTANEOUS | Status: DC
  Administered 2011-07-08 – 2011-07-09 (×4): 1 via TOPICAL
  Filled 2011-07-07: qty 7

## 2011-07-07 MED ORDER — NALOXONE HCL 0.4 MG/ML IJ SOLN: 0.4000 mg | INTRAMUSCULAR | Status: DC | PRN

## 2011-07-07 MED ORDER — SODIUM CHLORIDE 0.9 % IJ SOLN: 9.0000 mL | INTRAMUSCULAR | Status: DC | PRN

## 2011-07-07 MED ORDER — METOCLOPRAMIDE HCL 5 MG/ML IJ SOLN
5.0000 mg | Freq: Four times a day (QID) | INTRAMUSCULAR | Status: DC | PRN
  Filled 2011-07-07: qty 2

## 2011-07-07 MED ORDER — BISACODYL 10 MG RE SUPP
10.0000 mg | Freq: Every day | RECTAL | Status: DC
  Administered 2011-07-07: 10 mg via RECTAL
  Filled 2011-07-07 (×2): qty 1

## 2011-07-07 NOTE — Progress Notes (Signed)
2 Days Post-Op  Subjective: POD#2 Acute appendicitis with rupture and abscess.  On going nausea, vomited last PM.  Still a good deal of tenderness with movement and palpation.  Objective: Vital signs in last 24 hours: Temp:  [97.4 F (36.3 C)-98.9 F (37.2 C)] 98.9 F (37.2 C) (11/09 0800) Pulse Rate:  [70-105] 82  (11/09 0800) Resp:  [14-27] 20  (11/09 0800) BP: (116-160)/(63-82) 160/80 mmHg (11/09 0700) SpO2:  [94 %-98 %] 97 % (11/09 0800) Weight:  [92.8 kg (204 lb 9.4 oz)] 204 lb 9.4 oz (92.8 kg) (11/09 0600) Last BM Date: 07/02/11  Intake/Output from previous day: 11/08 0701 - 11/09 0700 In: 2910 [P.O.:610; I.V.:2250; IV Piggyback:50] Out: 1540 [Urine:1490; Emesis/NG output:50] Intake/Output this shift:    General appearance: alert, cooperative and mild discomfort, with any movement. Eyes: conjunctivae/corneas clear. PERRL, EOM's intact. Fundi benign. Resp: rales bibasilar and bilaterally GI: Tender, tight, but not really distended. few bowel sounds. a little flatus Extremities: extremities normal, atraumatic, no cyanosis or edema Incision looks good  Lab Results:   Basename 07/07/11 0726 07/06/11 0345  WBC 17.3* 19.2*  HGB 14.9 15.5  HCT 42.3 45.3  PLT 236 210    BMET  Basename 07/07/11 0726 07/06/11 0345  NA 141 133*  K 4.0 4.1  CL 106 98  CO2 16* 11*  GLUCOSE 340* 360*  BUN 23 20  CREATININE 0.77 0.81  CALCIUM 8.8 9.3   PT/INR No results found for this basename: LABPROT:2,INR:2 in the last 72 hours   Studies/Results: No results found.  Anti-infectives: Anti-infectives     Start     Dose/Rate Route Frequency Ordered Stop   07/05/11 2200   ertapenem (INVANZ) 1 g in sodium chloride 0.9 % 50 mL IVPB        1 g 100 mL/hr over 30 Minutes Intravenous Daily at bedtime 07/05/11 0224     07/05/11 0030   ertapenem (INVANZ) 1 g in sodium chloride 0.9 % 50 mL IVPB  Status:  Discontinued        1 g 100 mL/hr over 30 Minutes Intravenous Daily at bedtime  07/05/11 0022 07/05/11 0224         Current Facility-Administered Medications  Medication Dose Route Frequency Provider Last Rate Last Dose  . 0.9 % NaCl with KCl 20 mEq/ L  infusion   Intravenous Continuous Almond Lint, MD 100 mL/hr at 07/07/11 0214    . acetaminophen (TYLENOL) tablet 650 mg  650 mg Oral Q6H PRN Almond Lint, MD       Or  . acetaminophen (TYLENOL) suppository 650 mg  650 mg Rectal Q6H PRN Almond Lint, MD      . ALPRAZolam Prudy Feeler) tablet 0.5 mg  0.5 mg Oral QHS PRN Almond Lint, MD      . clotrimazole (LOTRIMIN) 1 % cream   Topical BID Almond Lint, MD      . diphenhydrAMINE (BENADRYL) injection 12.5 mg  12.5 mg Intravenous Q6H PRN Almond Lint, MD       Or  . diphenhydrAMINE (BENADRYL) 12.5 MG/5ML elixir 12.5 mg  12.5 mg Oral Q6H PRN Almond Lint, MD      . ertapenem (INVANZ) 1 g in sodium chloride 0.9 % 50 mL IVPB  1 g Intravenous QHS Almond Lint, MD   1 g at 07/06/11 2200  . hydrochlorothiazide (HYDRODIURIL) tablet 25 mg  25 mg Oral Daily Gary Fleet Abbott, PHARMD   25 mg at 07/06/11 0954  . HYDROcodone-acetaminophen (NORCO) 5-325 MG per  tablet 1-2 tablet  1-2 tablet Oral Q4H PRN Almond Lint, MD   2 tablet at 07/06/11 0331  . influenza (>/= 3 years) inactive virus vaccine (FLUZONE/FLUVIRIN) injection 0.5 mL  0.5 mL Intramuscular Tomorrow-1000 Ardeth Sportsman, MD      . insulin aspart (novoLOG) injection 0-20 Units  0-20 Units Subcutaneous TID WC Almond Lint, MD   15 Units at 07/07/11 0820  . insulin glargine (LANTUS) injection 5 Units  5 Units Subcutaneous QHS Almond Lint, MD   5 Units at 07/06/11 2207  . losartan (COZAAR) tablet 100 mg  100 mg Oral Daily Gary Fleet Abbott, PHARMD   100 mg at 07/06/11 0954  . metoprolol (LOPRESSOR) injection 2.5 mg  2.5 mg Intravenous Q4H PRN Carney Harder Norins, MD      . morphine 2 MG/ML injection 2 mg  2 mg Intravenous Q2H PRN Almond Lint, MD      . ondansetron (ZOFRAN) injection 4 mg  4 mg Intravenous Q6H PRN Almond Lint, MD   4 mg at 07/07/11 0435  . pantoprazole (PROTONIX) injection 40 mg  40 mg Intravenous QHS Almond Lint, MD   40 mg at 07/06/11 2204  . silodosin (RAPAFLO) capsule 8 mg  8 mg Oral Q breakfast Almond Lint, MD   8 mg at 07/07/11 0821    Assessment/Plan   1. POD #2 Ruptured appendix with abscess.  2. Ongoing nausea, and abdominal tenderness. Patient Active Problem List  Diagnoses  . DIABETES MELLITUS, TYPE II  . HYPERLIPIDEMIA  . ANXIETY  . OTITIS MEDIA, BILATERAL  . HYPERTENSION  . GERD  . HEMATOCHEZIA  . BREAST MASS  . LEG PAIN, BILATERAL  . RHEUMATIC FEVER, HX OF  . WISDOM TEETH EXTRACTION, HX OF  . Appendicitis with perforation  Plan:  Check 3 way abd, ? Does he need an NG.  Change to MORPHINE PCA. Increase time OOB.  Insentive Spirometry..  LOS: 3 days    Nathanel Tallman 07/07/2011

## 2011-07-07 NOTE — Progress Notes (Signed)
  Subjective:    Patient ID: Anthony [name], male    DOB: 03-14-1946, 65 y.o.   MRN: [id number]  HPI Comments: Subjective: **Patient is awake. He has had a tough night with Nausea and vomiting. Some abdominal pain*  Objective: Vital signs in last 24 hours: Temp:  (97.4 F (36.3 C)-98.6 F (37 C)) 98.2 F (36.8 C) (11/09 0400) Pulse Rate:  (70-108) 90  (11/09 0600) Resp:  (14-27) 15  (11/09 0600) BP: (116-154)/(54-96) 152/81 mmHg (11/09 0500) SpO2:  (94 %-99 %) 96 % (11/09 0600) Weight change:  Last BM Date: 07/02/11  Physical Exam: Vitals noted: BP is OK,running a little high in the 150's Chest - good BS, no rales, no increased work of breathing Cor - 2+ radial, RRR Abd- distended, scattered hypoactive BS, tender to palpation Neuro - a little somnolent  No labs  Intake/Output from previous day: 11/08 0701 - 11/09 0700 In: 2810 (P.O.:610; I.V.:2150; IV Piggyback:50) Out: 1540 (Urine:1490; Emesis/NG output:50)   Assessment/Plan: 1. GS - POD # 3 - lots of N some vomiting, distention. NG may make him more comfortable.  Plan - to GS           Will order routine labs if not already entered  2. DM- CBGs OK  Plan- continue sliding scale  3. HTN- BP is stable  Plan- continue prn IV lopressor  Illene Regulus 07/07/2011, 6:14 AM     Abdominal Pain      Review of Systems  Gastrointestinal: Positive for abdominal pain.       Objective:   Physical Exam        Assessment & Plan:

## 2011-07-08 ENCOUNTER — Inpatient Hospital Stay (HOSPITAL_COMMUNITY): Payer: BC Managed Care – PPO

## 2011-07-08 LAB — CBC
HCT: 36.7 % — ABNORMAL LOW (ref 39.0–52.0)
Hemoglobin: 12.1 g/dL — ABNORMAL LOW (ref 13.0–17.0)
WBC: 8 10*3/uL (ref 4.0–10.5)

## 2011-07-08 LAB — GLUCOSE, CAPILLARY
Glucose-Capillary: 306 mg/dL — ABNORMAL HIGH (ref 70–99)
Glucose-Capillary: 329 mg/dL — ABNORMAL HIGH (ref 70–99)

## 2011-07-08 LAB — BASIC METABOLIC PANEL
Chloride: 111 mEq/L (ref 96–112)
GFR calc Af Amer: >90 mL/min (ref >90)
Potassium: 3.9 mEq/L (ref 3.5–5.1)
Sodium: 144 mEq/L (ref 135–145)

## 2011-07-08 LAB — PREALBUMIN: Prealbumin: 9 mg/dL — ABNORMAL LOW (ref 17.0–34.0)

## 2011-07-08 LAB — MAGNESIUM: Magnesium: 1.8 mg/dL (ref 1.5–2.5)

## 2011-07-08 MED ORDER — INSULIN ASPART 100 UNIT/ML ~~LOC~~ SOLN
0.0000 [IU] | Freq: Every day | SUBCUTANEOUS | Status: DC
  Administered 2011-07-09: 3 [IU] via SUBCUTANEOUS
  Filled 2011-07-08 (×3): qty 3

## 2011-07-08 MED ORDER — INSULIN ASPART 100 UNIT/ML ~~LOC~~ SOLN
0.0000 [IU] | Freq: Three times a day (TID) | SUBCUTANEOUS | Status: DC
  Administered 2011-07-08: 5 [IU] via SUBCUTANEOUS

## 2011-07-08 MED ORDER — INSULIN ASPART 100 UNIT/ML ~~LOC~~ SOLN: 1.0000 [IU] | Freq: Three times a day (TID) | SUBCUTANEOUS | Status: DC

## 2011-07-08 MED ORDER — INSULIN GLARGINE 100 UNIT/ML ~~LOC~~ SOLN
10.0000 [IU] | Freq: Every day | SUBCUTANEOUS | Status: DC
  Administered 2011-07-08 – 2011-07-09 (×2): 10 [IU] via SUBCUTANEOUS

## 2011-07-08 NOTE — Progress Notes (Signed)
Seems to be improving.  Agree with plans and exam of KO, PA

## 2011-07-08 NOTE — Progress Notes (Signed)
3 Days Post-Op  Subjective: Pt had BM yesterday.  + flatus today.  Hungry, feels less distended  Objective: Vital signs in last 24 hours: Temp:  [97.5 F (36.4 C)-98.7 F (37.1 C)] 97.7 F (36.5 C) (11/10 0600) Pulse Rate:  [75-95] 79  (11/10 0600) Resp:  [15-22] 18  (11/10 0800) BP: (134-156)/(58-84) 140/76 mmHg (11/10 0600) SpO2:  [92 %-98 %] 95 % (11/10 0800) Last BM Date: 07/03/11  Intake/Output from previous day: 11/09 0701 - 11/10 0700 In: 2184.2 [P.O.:210; I.V.:1974.2] Out: 1750 [Urine:1750] Intake/Output this shift: Total I/O In: -  Out: 150 [Urine:150]  PE: Abd: soft, +bs, mild RLQ tenderness, minimal distention, incisions are c/d/i HT: reg Lungs: CTAB  Lab Results:   Basename 07/08/11 0500 07/07/11 0726  WBC 8.0 17.3*  HGB 12.1* 14.9  HCT 36.7* 42.3  PLT 211 236   BMET  Basename 07/08/11 0500 07/07/11 0726  NA 144 141  K 3.9 4.0  CL 111 106  CO2 20 16*  GLUCOSE 278* 340*  BUN 21 23  CREATININE 0.68 0.77  CALCIUM 8.5 8.8   PT/INR No results found for this basename: LABPROT:2,INR:2 in the last 72 hours   Studies/Results: @RISRSLT2 @  Anti-infectives: Anti-infectives     Start     Dose/Rate Route Frequency Ordered Stop   07/05/11 2200   ertapenem (INVANZ) 1 g in sodium chloride 0.9 % 50 mL IVPB        1 g 100 mL/hr over 30 Minutes Intravenous Daily at bedtime 07/05/11 0224     07/05/11 0030   ertapenem (INVANZ) 1 g in sodium chloride 0.9 % 50 mL IVPB  Status:  Discontinued        1 g 100 mL/hr over 30 Minutes Intravenous Daily at bedtime 07/05/11 0022 07/05/11 0224           Assessment/Plan  1, s/p lap appy, perforated appy 2. Post-op ileus, improving 3. DM 4. HTN  Plan: 1. Will try clears today 2. Mobilize and pulm toilet 3. Appreciate Dr. Debby Bud assistance with medical problems.   LOS: 4 days    Alora Gorey E 07/08/2011

## 2011-07-08 NOTE — Progress Notes (Signed)
Subjective: Awake and much more comfortable. No complaint of pain, no nausea  Objective: Rad: stack of coins and dilated colon. Final report pending Lab - creatinine normal, WBC down to 8.0 CBG's running elevated last 173   Physical Exam: Warm, dry, calm Chest- good BS w/o rales, wheezes Cor- RRR Abd- soft, rare BS RLQ, no guarding or rebound     Assessment/Plan: 1. GS- POD #4 - No fever, WBC normal, less distention. Persistent ileus  Plan - per GS  2. DM- CBGs still running a bit high.  Plan - increase Lantus to 10 units qhs  3. HTN - adequate control  Plan - continue prn IV lopressor  Gen'l - OOB to chair, PT consult   Illene Regulus 07/08/2011, 6:56 AM

## 2011-07-09 LAB — BASIC METABOLIC PANEL
Calcium: 8.8 mg/dL (ref 8.4–10.5)
GFR calc Af Amer: >90 mL/min (ref >90)
GFR calc non Af Amer: >90 mL/min (ref >90)
Sodium: 141 mEq/L (ref 135–145)

## 2011-07-09 LAB — CBC
Platelets: 235 10*3/uL (ref 150–400)
RBC: 4.46 MIL/uL (ref 4.22–5.81)
WBC: 8.4 10*3/uL (ref 4.0–10.5)

## 2011-07-09 LAB — GLUCOSE, CAPILLARY
Glucose-Capillary: 186 mg/dL — ABNORMAL HIGH (ref 70–99)
Glucose-Capillary: 235 mg/dL — ABNORMAL HIGH (ref 70–99)

## 2011-07-09 MED ORDER — PANTOPRAZOLE SODIUM 40 MG PO TBEC
40.0000 mg | DELAYED_RELEASE_TABLET | Freq: Every day | ORAL | Status: DC
  Administered 2011-07-09 – 2011-07-10 (×2): 40 mg via ORAL
  Filled 2011-07-09 (×2): qty 1

## 2011-07-09 NOTE — Progress Notes (Signed)
At risk for ileus Agree w films NGT if worse  Ardeth Sportsman, M.D., F.A.C.S. Gastrointestinal and Minimally Invasive [facility] [address] [place], [place] [address] [phone] Main / Paging [phone] Voice Mail

## 2011-07-09 NOTE — Progress Notes (Signed)
Subjective: Feeling better. Has been up in a chair and ambulated. Still not much appetite  Objective: No new lab. Abd 11/10 - high grade ileus   Physical Exam: vitals - noted: febrile, BP ok Chest - lear Cor -RRR Abd- BS+ x 4, soft, minimal tenderness     Assessment/Plan: 1. GS- POD #5 - making good progress. May get of PCA today.  Plan- continue to advance diet per GS  2. DM - CBGs running a bit high. Still not taking a regular diet  Plan - continue sliding scale           Continue lantus at 10 units           A1C in AM  3. HTN- good control  Plan - resume home meds   Illene Regulus 07/09/2011, 8:58 AM

## 2011-07-09 NOTE — Progress Notes (Signed)
Looking forward to more to eat. Abdomen is benign. Agree with A&P per KO PA

## 2011-07-09 NOTE — Progress Notes (Signed)
  4 Days Post-Op  Subjective: Pt feeling better.  Tolerating clears.  +flatus/BM  Objective: Vital signs in last 24 hours: Temp:  [97.5 F (36.4 C)-98.7 F (37.1 C)] 98.1 F (36.7 C) (11/11 0600) Pulse Rate:  [75-88] 75  (11/11 0600) Resp:  [18-22] 18  (11/11 0600) BP: (130-166)/(63-90) 130/63 mmHg (11/11 0600) SpO2:  [95 %-99 %] 97 % (11/11 0600) Last BM Date: 07/07/11  Intake/Output from previous day: 11/10 0701 - 11/11 0700 In: 3138.3 [P.O.:600; I.V.:2438.3; IV Piggyback:100] Out: 2700 [Urine:2700] Intake/Output this shift:    PE: Abd: soft, minimal distention, but improved, +BS, minimally tender, incisions c/d/i  Lab Results:   Basename 07/09/11 0640 07/08/11 0500  WBC 8.4 8.0  HGB 13.5 12.1*  HCT 39.5 36.7*  PLT 235 211   BMET  Basename 07/09/11 0640 07/08/11 0500  NA 141 144  K 3.9 3.9  CL 110 111  CO2 19 20  GLUCOSE 209* 278*  BUN 15 21  CREATININE 0.68 0.68  CALCIUM 8.8 8.5   PT/INR No results found for this basename: LABPROT:2,INR:2 in the last 72 hours   Studies/Results: @RISRSLT2 @  Anti-infectives: Anti-infectives     Start     Dose/Rate Route Frequency Ordered Stop   07/05/11 2200   ertapenem (INVANZ) 1 g in sodium chloride 0.9 % 50 mL IVPB        1 g 100 mL/hr over 30 Minutes Intravenous Daily at bedtime 07/05/11 0224     07/05/11 0030   ertapenem (INVANZ) 1 g in sodium chloride 0.9 % 50 mL IVPB  Status:  Discontinued        1 g 100 mL/hr over 30 Minutes Intravenous Daily at bedtime 07/05/11 0022 07/05/11 0224           Assessment/Plan  1. S/p lap appy, perforated appy 2. Post-op ileus 3. DM 4. HTN  Plan: CHO mod full liquid diet Decrease IVFs Stop PCA, start po pain meds Cont amubulation   LOS: 5 days    Jamespaul Secrist E 07/09/2011

## 2011-07-10 LAB — BASIC METABOLIC PANEL
CO2: 25 mEq/L (ref 19–32)
Calcium: 8.5 mg/dL (ref 8.4–10.5)
Chloride: 104 mEq/L (ref 96–112)
Sodium: 139 mEq/L (ref 135–145)

## 2011-07-10 LAB — GLUCOSE, CAPILLARY: Glucose-Capillary: 239 mg/dL — ABNORMAL HIGH (ref 70–99)

## 2011-07-10 LAB — HEMOGLOBIN A1C: Mean Plasma Glucose: 163 mg/dL — ABNORMAL HIGH (ref <117)

## 2011-07-10 MED ORDER — AMOXICILLIN-POT CLAVULANATE 875-125 MG PO TABS: 1.0000 | ORAL_TABLET | Freq: Two times a day (BID) | ORAL | Status: AC

## 2011-07-10 MED ORDER — HYDROCODONE-ACETAMINOPHEN 5-325 MG PO TABS: 1.0000 | ORAL_TABLET | ORAL | Status: AC | PRN

## 2011-07-10 NOTE — Progress Notes (Signed)
Subjective: Feels good!. Has had several BMs. He has been ambulating  Objective: No new labs or imaging CBGs running high   Physical Exam: Vitals reviewed - BP running a bit high. Chest - clear Cor - RRR Abd- BS + throughout, no guarding or rebound    Assessment/Plan: 1. GS- POD#6 - doing well, taking a diet, having BMs Plan - home soon?  2. DM - CBGs running up. He is taking a diet Plan - resume home regime- was on 58 units levemir plus sliding scale - increase lantus to 40 units qhs.           A1C pending  3. HTN- BPtracking a bit high. Plan- continue home medications.   If d/c's will need ov in 10-14    Illene Regulus 07/10/2011, 6:25 AM

## 2011-07-10 NOTE — Progress Notes (Signed)
5 Days Post-Op  Subjective: Pt ok. Tol fulls. No N/V. Several BMs. Has been OOB, walking. Pain control good.  Objective: Vital signs in last 24 hours: Temp:  [97.8 F (36.6 C)-98.5 F (36.9 C)] 98.2 F (36.8 C) (11/12 0500) Pulse Rate:  [63-75] 63  (11/12 0500) Resp:  [18-20] 18  (11/12 0500) BP: (124-158)/(68-77) 158/77 mmHg (11/12 0500) SpO2:  [94 %-97 %] 95 % (11/12 0500) Last BM Date: 07/09/11  Intake/Output this shift:    Physical Exam: Lungs: CTA without w/r/r Heart: Regular Abdomen: soft, ND, appropriately tender   Incisions all c/d/i without erythema or hematoma. Ext: No edema or tenderness   Labs: CBC  Basename 07/09/11 0640 07/08/11 0500  WBC 8.4 8.0  HGB 13.5 12.1*  HCT 39.5 36.7*  PLT 235 211   BMET  Basename 07/10/11 0730 07/09/11 0640  NA 139 141  K 3.1* 3.9  CL 104 110  CO2 25 19  GLUCOSE 259* 209*  BUN 13 15  CREATININE 0.60 0.68  CALCIUM 8.5 8.8   LFT No results found for this basename: PROT,ALBUMIN,AST,ALT,ALKPHOS,BILITOT,BILIDIR,IBILI,LIPASE in the last 72 hours PT/INR No results found for this basename: LABPROT:2,INR:2 in the last 72 hours ABG No results found for this basename: PHART:2,PCO2:2,PO2:2,HCO3:2 in the last 72 hours  Studies/Results: No results found.   Assessment: Principal Problem:  *Appendicitis with perforation Active Problems:  DIABETES MELLITUS, TYPE II  HYPERTENSION s/p Procedure(s): APPENDECTOMY LAPAROSCOPIC Plan: Increase diet to reg diabetic. Dc home later today. Appreciate Dr. Debby Bud help.  LOS: 6 days    Marianna Fuss 07/10/2011

## 2011-07-10 NOTE — Discharge Summary (Signed)
Physician Discharge Summary  Patient ID: Anthony [name] MRN: [id number] DOB/AGE: Jul 04, 1946 65 y.o.  Admit date: 07/04/2011 Discharge date: 07/10/2011  Admission Diagnoses: Acute appendicitis Discharge Diagnoses:  Principal Problem:  *Appendicitis with perforation Active Problems:  DIABETES MELLITUS, TYPE II  HYPERTENSION  Procedure(s): APPENDECTOMY LAPAROSCOPIC  Discharged Condition: good  Hospital Course: Pt seen after workup in ED finds appendicitis. Admitted on 11-6 and taken to OR for lap appy. Found to be perforated but no gross contamination. Placed on IV abx and treated with mild post op ileus. Medical issues including HTN and DM well controlled during admit, managed by Dr. Debby Bud. He eventually began having bowel function and his diet advanced to solids with good toleration. He is now stable for DC.  Consults: IM-Norins    Discharge Exam: Blood pressure 158/77, pulse 63, temperature 98.2 F (36.8 C), temperature source Oral, resp. rate 18, height 5' 11.5" (1.816 m), weight 204 lb 9.4 oz (92.8 kg), SpO2 95.00%. Lungs: CTA without w/r/r Heart: Regular Abdomen: soft, ND, appropriately tender   Incisions all c/d/i without erythema or hematoma. Ext: No edema or tenderness   Disposition:   Discharge Orders    Future Orders Please Complete By Expires   Diet Carb Modified      Comments:   Diabetic diet   Increase activity slowly      Discharge instructions      Comments:   [facility] ______[facility] LAPAROSCOPIC SURGERY: POST OP INSTRUCTIONS Always review your discharge instruction sheet given to you by the facility where your surgery was performed. IF YOU HAVE DISABILITY OR FAMILY LEAVE FORMS, YOU MUST BRING THEM TO THE OFFICE FOR PROCESSING.   DO NOT GIVE THEM TO YOUR DOCTOR.  A prescription for pain medication may be given to you upon discharge.  Take your pain medication as prescribed, if needed.  If narcotic pain medicine is not needed, then  you may take acetaminophen (Tylenol) or ibuprofen (Advil) as needed. Take your usually prescribed medications unless otherwise directed. If you need a refill on your pain medication, please contact your pharmacy.  They will contact our office to request authorization. Prescriptions will not be filled after 5pm or on week-ends. You should follow a light diet the first few days after arrival home, such as soup and crackers, etc.  Be sure to include lots of fluids daily. Most patients will experience some swelling and bruising in the area of the incisions.  Ice packs will help.  Swelling and bruising can take several days to resolve.  It is common to experience some constipation if taking pain medication after surgery.  Increasing fluid intake and taking a stool softener (such as Colace) will usually help or prevent this problem from occurring.  A mild laxative (Milk of Magnesia or Miralax) should be taken according to package instructions if there are no bowel movements after 48 hours. Unless discharge instructions indicate otherwise, you may remove your bandages 24-48 hours after surgery, and you may shower at that time.  You may have steri-strips (small skin tapes) in place directly over the incision.  These strips should be left on the skin for 7-10 days.  If your surgeon used skin glue on the incision, you may shower in 24 hours.  The glue will flake off over the next 2-3 weeks.  Any sutures or staples will be removed at the office during your follow-up visit. ACTIVITIES:  You may resume regular (light) daily activities beginning the next day-such as daily self-care, walking,  climbing stairs-gradually increasing activities as tolerated.  You may have sexual intercourse when it is comfortable.  Refrain from any heavy lifting or straining until approved by your doctor. You may drive when you are no longer taking prescription pain medication, you can comfortably wear a seatbelt, and you can safely maneuver  your car and apply brakes. RETURN TO WORK:  __________________________________________________________ Bonita Quin should see your doctor in the office for a follow-up appointment approximately 2-3 weeks after your surgery.  Make sure that you call for this appointment within a day or two after you arrive home to insure a convenient appointment time. OTHER INSTRUCTIONS: __________________________________________________________________________________________________________________________ __________________________________________________________________________________________________________________________ WHEN TO CALL YOUR DOCTOR: Fever over 101.0 Inability to urinate Continued bleeding from incision. Increased pain, redness, or drainage from the incision. Increasing abdominal pain  The clinic staff is available to answer your questions during regular business hours.  Please don't hesitate to call and ask to speak to one of the nurses for clinical concerns.  If you have a medical emergency, go to the nearest emergency room or call 911.  A surgeon from [facility] is always on call at the hospital. [address], [address], [place], [place]  [address] ? [address] [address], [place], [place]   [address] [phone] ? [phone] ? FAX [phone] Web site: [email or web address]    May shower / Bathe      Comments:   May shower, no tub baths   May walk up steps      Lifting restrictions      Comments:   No heavy lifting X 2 weeks, more than 10lbs   No dressing needed      Call MD for:  temperature >100.4      Call MD for:  persistant nausea and vomiting      Call MD for:  severe uncontrolled pain      Call MD for:  redness, tenderness, or signs of infection (pain, swelling, redness, odor or green/yellow discharge around incision site)        Current Discharge Medication List    START taking these medications   Details  amoxicillin-clavulanate (AUGMENTIN) 875-125  MG per tablet Take 1 tablet by mouth 2 (two) times daily. Qty: 10 tablet, Refills: 0    HYDROcodone-acetaminophen (NORCO) 5-325 MG per tablet Take 1-2 tablets by mouth every 4 (four) hours as needed for pain. Qty: 30 tablet, Refills: 0      CONTINUE these medications which have NOT CHANGED   Details  !! insulin aspart (NOVOLOG) 100 UNIT/ML injection Inject 11-15 Units into the skin 3 (three) times daily before meals. Use 11 units at breakfast, and 15 units at lunch and dinner     LEVEMIR FLEXPEN 100 UNIT/ML injection INJECT 55 UNITS AT BEDTIME Qty: 1 Syringe, Refills: 6    losartan-hydrochlorothiazide (HYZAAR) 100-25 MG per tablet take 1 tablet by mouth once daily Qty: 30 tablet, Refills: 4    pantoprazole (PROTONIX) 40 MG tablet TAKE 1 TABLET BY MOUTH ONCE DAILY Qty: 30 tablet, Refills: 1    RAPAFLO 8 MG CAPS capsule take 1 capsule by mouth once daily Qty: 30 capsule, Refills: 11    simvastatin (ZOCOR) 20 MG tablet TAKE 1 TABLET BY MOUTH EVERY EVENING Qty: 30 tablet, Refills: 4    B-D ULTRAFINE III SHORT PEN 31G X 8 MM MISC USE AS DIRECTED Qty: 100 each, Refills: 5    !! NOVOLOG FLEXPEN 100 UNIT/ML injection USE AS DIRECTED BEFORE MEALS Qty: 1 Syringe, Refills: 6  sildenafil (VIAGRA) 100 MG tablet Take 100 mg by mouth daily as needed.       !! - Potential duplicate medications found. Please discuss with provider.    STOP taking these medications     ALPRAZolam (XANAX) 0.5 MG tablet      ALPRAZolam (XANAX) 0.5 MG tablet      clotrimazole-betamethasone (LOTRISONE) cream      Insulin Syringe-Needle U-100 (INSULIN SYRINGE .3CC/31GX5/16") 31G X 5/16" 0.3 ML MISC      ONE TOUCH ULTRA TEST test strip        Follow-up Information    Follow up with [facility],MD on 07/25/2011. (1:50pm DOW clinic)    Contact information:   [address] [place] [address] [phone]          Signed: Marianna Fuss 07/10/2011, 9:45 AM

## 2011-07-10 NOTE — Progress Notes (Signed)
I have seen and examined the patient and agree with the assessment and plans  

## 2011-07-10 NOTE — Progress Notes (Signed)
Pt and wife given discharge instructions and prescriptions.  They verbalized understanding of all instructions.  Pt taken down for discharge via wheelchair.  Sol Blazing [facility] 07/10/2011 2:11 PM

## 2011-07-10 NOTE — Progress Notes (Signed)
Inpatient Diabetes Program Recommendations  AACE/ADA: New Consensus Statement on Inpatient Glycemic Control (2009)  Target Ranges:  Prepandial:   less than 140 mg/dL      Peak postprandial:   less than 180 mg/dL (1-2 hours)      Critically ill patients:  140 - 180 mg/dL   Reason for Visit: Prolonged hyperglycemia  Inpatient Diabetes Program Recommendations Insulin - Basal: Please increase Lantus closer  to home Levemir dose of 55 units at HS.  Noted plan in progress notes to resume home meds.   Insulin - Meal Coverage: Consider adding Novolog meal coverage once diet started.  Consider Novolog 4 units tid with meals (Hold if pt eats less than 50%) HgbA1C: A1c=7.1% indicating good control of diabetes prior to admit.  Note: Noted progress notes state plan is to resume home meds.

## 2011-07-12 ENCOUNTER — Encounter (HOSPITAL_COMMUNITY): Payer: Self-pay | Admitting: General Surgery

## 2011-07-13 NOTE — ED Notes (Signed)
.  Medical screening examination/treatment/procedure(s) were performed by non-physician practitioner and as supervising physician I was immediately available for consultation/collaboration.   Glynn Octave, MD 07/13/11 [phone]

## 2011-07-15 NOTE — ED Provider Notes (Signed)
Medical screening examination/treatment/procedure(s) were performed by non-physician practitioner and as supervising physician I was immediately available for consultation/collaboration.   Glynn Octave, MD 07/15/11 [phone]

## 2011-07-17 ENCOUNTER — Telehealth: Payer: Self-pay

## 2011-07-17 NOTE — Telephone Encounter (Signed)
Patient called requesting rx for a generic rapaflo. Please advise, I wasn't sure if there is a generic

## 2011-07-18 NOTE — Telephone Encounter (Signed)
Rx called into pharmacy.  Informed patient.

## 2011-07-18 NOTE — Telephone Encounter (Signed)
tamsulosin 0.4 mg qPM, #30 refill prn

## 2011-07-25 ENCOUNTER — Encounter (INDEPENDENT_AMBULATORY_CARE_PROVIDER_SITE_OTHER): Payer: Self-pay | Admitting: Radiology

## 2011-07-25 ENCOUNTER — Ambulatory Visit (INDEPENDENT_AMBULATORY_CARE_PROVIDER_SITE_OTHER): Payer: BC Managed Care – PPO | Admitting: Radiology

## 2011-07-25 VITALS — BP 130/86 | HR 80 | Resp 14 | Ht 71.0 in | Wt 205.0 lb

## 2011-07-25 DIAGNOSIS — K37 Unspecified appendicitis: Secondary | ICD-10-CM

## 2011-07-25 NOTE — Progress Notes (Signed)
DEL OVERFELT 03-05-1946 [id number] 07/25/2011   History of Present Illness: Anthony [name] is a  65 y.o. male who presents today status post lap appy.  Pathology reveals appendicitis.  The patient is tolerating a regular diet, having normal bowel movements, has good pain control.  He  is back to most normal activities.   Physical Exam: Abd: soft, nontender, active bowel sounds, nondistended.  All incisions are well healed.  Impression: 1.  Acute appendicitis, s/p lap appy  Plan: He  is able to return to normal activities. He  may follow up on a prn basis.

## 2011-08-14 ENCOUNTER — Other Ambulatory Visit: Payer: Self-pay | Admitting: Internal Medicine

## 2011-09-12 ENCOUNTER — Other Ambulatory Visit: Payer: Self-pay | Admitting: Internal Medicine

## 2011-10-09 ENCOUNTER — Other Ambulatory Visit (INDEPENDENT_AMBULATORY_CARE_PROVIDER_SITE_OTHER): Payer: Self-pay

## 2011-10-09 ENCOUNTER — Encounter: Payer: Self-pay | Admitting: Internal Medicine

## 2011-10-09 ENCOUNTER — Ambulatory Visit (INDEPENDENT_AMBULATORY_CARE_PROVIDER_SITE_OTHER): Payer: BC Managed Care – PPO | Admitting: Internal Medicine

## 2011-10-09 VITALS — BP 110/78 | HR 81 | Temp 96.7°F | Resp 16 | Wt 210.0 lb

## 2011-10-09 DIAGNOSIS — E119 Type 2 diabetes mellitus without complications: Secondary | ICD-10-CM

## 2011-10-09 MED ORDER — SIMVASTATIN 20 MG PO TABS: 20.0000 mg | ORAL_TABLET | Freq: Every day | ORAL | Status: DC

## 2011-10-09 MED ORDER — LOSARTAN POTASSIUM-HCTZ 100-25 MG PO TABS: 30.0000 | ORAL_TABLET | Freq: Every day | ORAL | Status: DC

## 2011-10-09 MED ORDER — PANTOPRAZOLE SODIUM 40 MG PO TBEC: 40.0000 mg | DELAYED_RELEASE_TABLET | Freq: Every day | ORAL | Status: DC

## 2011-10-09 MED ORDER — INSULIN ASPART 100 UNIT/ML ~~LOC~~ SOLN: 11.0000 [IU] | Freq: Three times a day (TID) | SUBCUTANEOUS | Status: DC

## 2011-10-09 MED ORDER — INSULIN PEN NEEDLE 31G X 8 MM MISC: 4.0000 | Freq: Four times a day (QID) | Status: DC

## 2011-10-09 MED ORDER — TAMSULOSIN HCL 0.4 MG PO CAPS: 0.4000 mg | ORAL_CAPSULE | Freq: Every day | ORAL | Status: DC

## 2011-10-09 MED ORDER — SILDENAFIL CITRATE 100 MG PO TABS: 100.0000 mg | ORAL_TABLET | Freq: Every day | ORAL | Status: DC | PRN

## 2011-10-09 MED ORDER — ALPRAZOLAM 0.5 MG PO TBDP: ORAL_TABLET | ORAL | Status: DC

## 2011-10-09 NOTE — Patient Instructions (Signed)
Check with your insurance carrier about coverage for the shingles vaccine.  All Rx renewed for 1 year except viagra and alprazolam are for 6 months.

## 2011-10-10 ENCOUNTER — Other Ambulatory Visit: Payer: Self-pay | Admitting: Internal Medicine

## 2011-10-10 NOTE — Assessment & Plan Note (Signed)
Subopitmal control.\  Plan - he will need to titrate his insulin starting with AM fasting CBG and once controlled will need to adjust before meal insulin           Will refer to diabetic teaching center.

## 2011-10-10 NOTE — Progress Notes (Signed)
  Subjective:    Patient ID: Anthony [name], male    DOB: 1945-11-20, 66 y.o.   MRN: [id number]  HPI Anthony [name] presents for follow-up of diabetes. He is on basal insulin and has had good control at home. He is due for A1C. He would like diabetic counseling if his sugars are too high. He is otherwise doing well. His biggest problem is that he is bored now that he is retired.  I have reviewed the patient's medical history in detail and updated the computerized patient record.    Review of Systems System review is negative for any constitutional, cardiac, pulmonary, GI or neuro symptoms or complaints other than as described in the HPI.     Objective:   Physical Exam Filed Vitals:   10/09/11 1311  BP: 110/78  Pulse: 81  Temp: 96.7 F (35.9 C)  Resp: 16   Gen'l pleasant white man in no distress Pulm - normal respiration Cor- 2+ radial pulse, RRR Neuro - A&O x 3, normal gait.  Lab Results  Component Value Date   HGBA1C 8.3* 10/09/2011          Assessment & Plan:

## 2011-10-11 ENCOUNTER — Other Ambulatory Visit: Payer: Self-pay | Admitting: Internal Medicine

## 2011-10-11 NOTE — Telephone Encounter (Signed)
DENIED-Rx escript 02.11.13 #[id number]

## 2011-10-11 NOTE — Telephone Encounter (Signed)
Denied-Sent by MD 02.11.2013

## 2011-10-15 ENCOUNTER — Encounter: Payer: Self-pay | Admitting: Internal Medicine

## 2011-11-22 ENCOUNTER — Ambulatory Visit: Payer: Self-pay | Admitting: *Deleted

## 2011-11-23 ENCOUNTER — Encounter: Payer: BC Managed Care – PPO | Attending: Internal Medicine | Admitting: *Deleted

## 2011-11-23 ENCOUNTER — Encounter: Payer: Self-pay | Admitting: *Deleted

## 2011-11-23 VITALS — Ht 72.0 in | Wt 211.5 lb

## 2011-11-23 DIAGNOSIS — Z713 Dietary counseling and surveillance: Secondary | ICD-10-CM | POA: Insufficient documentation

## 2011-11-23 DIAGNOSIS — E119 Type 2 diabetes mellitus without complications: Secondary | ICD-10-CM | POA: Insufficient documentation

## 2011-11-23 NOTE — Patient Instructions (Addendum)
Goals:  Follow Diabetes Meal Plan as instructed  Eat 3 meals and snacks if hungry  Limit carbohydrate intake to 45 grams carbohydrate/meal  Limit carbohydrate intake to 0-30 grams carbohydrate/snack  Add lean protein foods to meals/snacks  Monitor glucose levels as instructed by your doctor, add post meal on occasion  Use 15 grams carbohydrate prior to activity to prevent risk of low BG  Aim for 15-30 mins of physical activity daily as tolerated  Bring food record and glucose log to your next nutrition visit

## 2011-11-23 NOTE — Progress Notes (Signed)
  Medical Nutrition Therapy:  Appt start time: 1200 end time:  1300.  Assessment:  Primary concerns today: patient here for diabetes education. He states he has had diabetes for about 12 years and has been taking insulin for over 3 years. 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 His wife still works there and they enjoy having their 3 grown children and 7 grandchildren nearby. He had diabetes education when first diagnosed but not any since then   MEDICATIONS: see list. Diabetes medication includes 55 units Levemir @ bedtime and 11-15 units Novolog pre meal with added units if BG is high.   DIETARY INTAKE:  Usual eating pattern includes 3 meals and 1 snack per day.  Everyday foods include good variety of all food groups.  Avoided foods include fried foods, regular sodas, most desserts.    24-hr recall:  B ( AM): 2 slices toast, SF jelly, coffee with Splenda  Snk ( AM): none  L ( PM): ham and cheese sandwich on whole wheat with mayo, occasionally chips, diet Coke Snk ( PM): none D ( PM): meat, vegetable x 1-2, starch, diet Coke Snk ( PM): fresh fruit OR nuts OR Trail Mix Beverages: coffee, Diet Coke  Usual physical activity: mow grass, yard work, has restored classic cars and is active in [employer] needs: 1600 calories 180 g carbohydrates 120 g protein 44 g fat  Progress Towards Goal(s):  In progress.   Nutritional Diagnosis:  NB-1.1 Food and nutrition-related knowledge deficit As related to diabetes.  As evidenced by A1c of 8.3%.    Intervention:  Nutrition counseling and diabetes education provided. Also answered questions about insulin pumps and their benefits. Discussed basic physiology of diabetes, symptoms and treatment of high and low BG, action of both of his insulins, carb counting and reading food labels. Goals:  Follow Diabetes Meal Plan as instructed  Eat 3 meals and snacks if hungry  Limit carbohydrate intake to 45 grams  carbohydrate/meal  Limit carbohydrate intake to 0-30 grams carbohydrate/snack  Add lean protein foods to meals/snacks  Monitor glucose levels as instructed by your doctor, add post meal on occasion  Use 15 grams carbohydrate prior to activity to prevent risk of low BG  Aim for 15-30 mins of physical activity daily as tolerated  Bring food record and glucose log to your next nutrition visit   Handouts given during visit include:  Living Well with Diabetes   Carb Counting and Reading Food Label handouts  Meal Plan Card  BG Log Sheet  Monitoring/Evaluation:  Dietary intake, exercise, reading food labels, and body weight prn.

## 2012-03-14 ENCOUNTER — Telehealth: Payer: Self-pay | Admitting: *Deleted

## 2012-03-14 ENCOUNTER — Other Ambulatory Visit (INDEPENDENT_AMBULATORY_CARE_PROVIDER_SITE_OTHER): Payer: BC Managed Care – PPO

## 2012-03-14 DIAGNOSIS — E119 Type 2 diabetes mellitus without complications: Secondary | ICD-10-CM

## 2012-03-14 LAB — HEMOGLOBIN A1C: Hgb A1c MFr Bld: 7.8 % — ABNORMAL HIGH (ref 4.6–6.5)

## 2012-03-14 NOTE — Telephone Encounter (Signed)
Patient in lab for lab work. States gets HGA1C every 3 month. Order entered. Last OV   09/2011

## 2012-03-14 NOTE — Telephone Encounter (Signed)
Ok fo lab and ok to put in repeating lab order

## 2012-03-15 NOTE — Telephone Encounter (Signed)
Orders placed for HGBA1C for every 3 month

## 2012-03-18 ENCOUNTER — Encounter: Payer: Self-pay | Admitting: Internal Medicine

## 2012-04-10 ENCOUNTER — Encounter: Payer: Self-pay | Admitting: Internal Medicine

## 2012-04-10 ENCOUNTER — Ambulatory Visit (INDEPENDENT_AMBULATORY_CARE_PROVIDER_SITE_OTHER): Payer: BC Managed Care – PPO | Admitting: Internal Medicine

## 2012-04-10 VITALS — BP 134/80 | HR 84 | Temp 97.2°F | Resp 16 | Wt 210.0 lb

## 2012-04-10 DIAGNOSIS — N529 Male erectile dysfunction, unspecified: Secondary | ICD-10-CM

## 2012-04-10 DIAGNOSIS — E119 Type 2 diabetes mellitus without complications: Secondary | ICD-10-CM

## 2012-04-10 MED ORDER — TADALAFIL 5 MG PO TABS: 5.0000 mg | ORAL_TABLET | Freq: Every day | ORAL | Status: DC | PRN

## 2012-04-10 NOTE — Assessment & Plan Note (Signed)
Viagra is giving suboptimal results  Plan  Trial of ciali 5 mg daily, he does have BPH.

## 2012-04-10 NOTE — Assessment & Plan Note (Addendum)
CBG record reveals variable readings.  Plan Continue present medications at the current doses  Sliding scale - for a 90 minutes after meal gluose of 250-300 take 2 units novolog                                                                                                 301-350 take 4 units novolog                      350+     Take 6 units novolog  Repeat A1C October 21

## 2012-04-10 NOTE — Progress Notes (Signed)
  Subjective:    Patient ID: Anthony [name], male    DOB: 1946/05/29, 66 y.o.   MRN: [id number]  HPI Since last A1C 7.8%  July 19th he has been check CBG before meals and 90 minutes after. He has a wide variability in readings: generally 200 range post-prandial but occasional reading in the 50s. In the past 2 weeks he has had three occasions when he needed to take glucose tabs. He can tell when his sugar is high - increased urination.  PMH, FamHx and SocHx reviewed for any changes and relevance.  Current Outpatient Prescriptions on File Prior to Visit  Medication Sig Dispense Refill  . ALPRAZolam (NIRAVAM) 0.5 MG dissolvable tablet 1 or 1/2 po qhs prn  30 tablet  5  . insulin aspart (NOVOLOG FLEXPEN) 100 UNIT/ML injection Inject 11-15 Units into the skin 3 (three) times daily before meals.  4 pen  11  . Insulin Pen Needle (B-D ULTRAFINE III SHORT PEN) 31G X 8 MM MISC Inject 4 Devices into the skin QID.  100 each  11  . LEVEMIR FLEXPEN 100 UNIT/ML injection INJECT 55 UNITS AT BEDTIME  15 mL  11  . pantoprazole (PROTONIX) 40 MG tablet Take 1 tablet (40 mg total) by mouth daily.  30 tablet  11  . sildenafil (VIAGRA) 100 MG tablet Take 1 tablet (100 mg total) by mouth daily as needed.  5 tablet  5  . simvastatin (ZOCOR) 20 MG tablet Take 1 tablet (20 mg total) by mouth at bedtime.  30 tablet  11  . Tamsulosin HCl (FLOMAX) 0.4 MG CAPS Take 1 capsule (0.4 mg total) by mouth daily.  30 capsule  11      Review of Systems System review is negative for any constitutional, cardiac, pulmonary, GI or neuro symptoms or complaints other than as described in the HPI.     Objective:   Physical Exam Filed Vitals:   04/10/12 1409  BP: 134/80  Pulse: 84  Temp: 97.2 F (36.2 C)  Resp: 16   Well tanned man in no distress Cor- RRR Pulm - normal respirations Neuro - A&O x 3.        Assessment & Plan:

## 2012-04-10 NOTE — Patient Instructions (Addendum)
Diabetes - review of the record reveal variable control. As a principle: better to high than too low. Plan Continue present medications at the current doses  Sliding scale - for a 90 minutes after meal gluose of 250-300 take 2 units novolog                                                                                                 301-350 take 4 units novolog                      350+     Take 6 units novolog   Return October 21st for repeat A1C.  ED - trial of cialis 5 mg daily

## 2012-04-21 ENCOUNTER — Other Ambulatory Visit: Payer: Self-pay | Admitting: Internal Medicine

## 2012-05-17 ENCOUNTER — Encounter: Payer: Self-pay | Admitting: Gastroenterology

## 2012-05-27 ENCOUNTER — Telehealth: Payer: Self-pay

## 2012-05-27 MED ORDER — TADALAFIL 20 MG PO TABS: 10.0000 mg | ORAL_TABLET | ORAL | Status: DC | PRN

## 2012-05-27 NOTE — Telephone Encounter (Signed)
Pt called requesting Rx for Cialis changed from 5 mg QD to 20 mg PRN for insurance reasons.

## 2012-05-27 NOTE — Telephone Encounter (Signed)
Ok for cialis 20 mg. Rx sent in

## 2012-06-28 ENCOUNTER — Other Ambulatory Visit (INDEPENDENT_AMBULATORY_CARE_PROVIDER_SITE_OTHER): Payer: BC Managed Care – PPO

## 2012-06-28 ENCOUNTER — Telehealth: Payer: Self-pay | Admitting: *Deleted

## 2012-06-28 DIAGNOSIS — E119 Type 2 diabetes mellitus without complications: Secondary | ICD-10-CM

## 2012-06-28 LAB — HEMOGLOBIN A1C: Hgb A1c MFr Bld: 7.7 % — ABNORMAL HIGH (ref 4.6–6.5)

## 2012-06-28 NOTE — Telephone Encounter (Signed)
Message copied by Elnora Morrison on Fri Jun 28, 2012  1:43 PM ------      Message from: Anselm Jungling      Created: Fri Jun 28, 2012  1:09 PM                   ----- Message -----         From: Jacques Navy, MD         Sent: 06/28/2012   1:09 PM           To: Anselm Jungling, CMA            Call pt - A1C 7.7 % - a little better - no change in medication

## 2012-06-28 NOTE — Telephone Encounter (Signed)
Patient notified of A1C lab results and no change in medications per Dr. Debby Bud.

## 2012-08-22 ENCOUNTER — Telehealth: Payer: Self-pay | Admitting: Internal Medicine

## 2012-08-22 NOTE — Telephone Encounter (Signed)
Pt is aware.  

## 2012-08-22 NOTE — Telephone Encounter (Signed)
Not a medical reason to excuse from jury duty. Sorry

## 2012-08-22 NOTE — Telephone Encounter (Signed)
Pt wants a letter excusing him from [employer] on Jan 9 due to DM and frequent urination.

## 2012-08-28 DIAGNOSIS — N138 Other obstructive and reflux uropathy: Secondary | ICD-10-CM

## 2012-08-28 DIAGNOSIS — N401 Enlarged prostate with lower urinary tract symptoms: Secondary | ICD-10-CM

## 2012-08-28 HISTORY — DX: Other obstructive and reflux uropathy: N13.8

## 2012-08-28 HISTORY — DX: Other obstructive and reflux uropathy: N40.1

## 2012-09-02 ENCOUNTER — Other Ambulatory Visit: Payer: Self-pay | Admitting: Internal Medicine

## 2012-09-26 ENCOUNTER — Other Ambulatory Visit (INDEPENDENT_AMBULATORY_CARE_PROVIDER_SITE_OTHER): Payer: BC Managed Care – PPO

## 2012-09-26 DIAGNOSIS — E119 Type 2 diabetes mellitus without complications: Secondary | ICD-10-CM

## 2012-09-26 LAB — HEMOGLOBIN A1C: Hgb A1c MFr Bld: 7.7 % — ABNORMAL HIGH (ref 4.6–6.5)

## 2012-09-27 ENCOUNTER — Encounter: Payer: Self-pay | Admitting: Internal Medicine

## 2012-10-01 ENCOUNTER — Other Ambulatory Visit: Payer: Self-pay | Admitting: Internal Medicine

## 2012-10-25 ENCOUNTER — Other Ambulatory Visit: Payer: Self-pay | Admitting: Internal Medicine

## 2012-11-20 ENCOUNTER — Other Ambulatory Visit: Payer: Self-pay | Admitting: Internal Medicine

## 2012-12-23 ENCOUNTER — Telehealth: Payer: Self-pay

## 2012-12-23 ENCOUNTER — Other Ambulatory Visit (INDEPENDENT_AMBULATORY_CARE_PROVIDER_SITE_OTHER): Payer: PRIVATE HEALTH INSURANCE

## 2012-12-23 DIAGNOSIS — E119 Type 2 diabetes mellitus without complications: Secondary | ICD-10-CM

## 2012-12-23 NOTE — Telephone Encounter (Signed)
Message copied by Noreene Larsson on Mon Dec 23, 2012  3:17 PM ------      Message from: Illene Regulus E      Created: Mon Dec 23, 2012  2:40 PM       Please call patient: A1C 7.8% - no real change from last study. Continue present medication dosing. ------

## 2012-12-23 NOTE — Telephone Encounter (Signed)
Pt notified of results and states in the afternoons he goes hypoglycemic.

## 2013-03-13 ENCOUNTER — Encounter: Payer: Self-pay | Admitting: Internal Medicine

## 2013-03-13 ENCOUNTER — Other Ambulatory Visit (INDEPENDENT_AMBULATORY_CARE_PROVIDER_SITE_OTHER): Payer: BC Managed Care – PPO

## 2013-03-13 ENCOUNTER — Ambulatory Visit (INDEPENDENT_AMBULATORY_CARE_PROVIDER_SITE_OTHER): Payer: BC Managed Care – PPO | Admitting: Internal Medicine

## 2013-03-13 ENCOUNTER — Ambulatory Visit (INDEPENDENT_AMBULATORY_CARE_PROVIDER_SITE_OTHER)
Admission: RE | Admit: 2013-03-13 | Discharge: 2013-03-13 | Disposition: A | Discharge status: Home / Self Care | Payer: BC Managed Care – PPO | Source: Ambulatory Visit | Attending: Internal Medicine | Admitting: Internal Medicine

## 2013-03-13 VITALS — BP 130/80 | HR 80 | Temp 97.4°F | Wt 214.0 lb

## 2013-03-13 DIAGNOSIS — I10 Essential (primary) hypertension: Secondary | ICD-10-CM

## 2013-03-13 DIAGNOSIS — E119 Type 2 diabetes mellitus without complications: Secondary | ICD-10-CM

## 2013-03-13 DIAGNOSIS — E785 Hyperlipidemia, unspecified: Secondary | ICD-10-CM

## 2013-03-13 DIAGNOSIS — M25569 Pain in unspecified knee: Secondary | ICD-10-CM

## 2013-03-13 DIAGNOSIS — M25561 Pain in right knee: Secondary | ICD-10-CM

## 2013-03-13 DIAGNOSIS — M25562 Pain in left knee: Secondary | ICD-10-CM

## 2013-03-13 LAB — HEPATIC FUNCTION PANEL
ALT: 15 U/L (ref 0–53)
AST: 12 U/L (ref 0–37)
Albumin: 3.9 g/dL (ref 3.5–5.2)
Alkaline Phosphatase: 74 U/L (ref 39–117)

## 2013-03-13 LAB — COMPREHENSIVE METABOLIC PANEL
ALT: 15 U/L (ref 0–53)
Alkaline Phosphatase: 74 U/L (ref 39–117)
CO2: 28 mEq/L (ref 19–32)
Creatinine, Ser: 0.9 mg/dL (ref 0.4–1.5)
GFR: 93.68 mL/min (ref >60.00)
Glucose, Bld: 251 mg/dL — ABNORMAL HIGH (ref 70–99)
Total Bilirubin: 0.9 mg/dL (ref 0.3–1.2)

## 2013-03-13 LAB — LIPID PANEL
Cholesterol: 119 mg/dL (ref 0–200)
HDL: 32.1 mg/dL — ABNORMAL LOW (ref >39.00)
VLDL: 13.6 mg/dL (ref 0.0–40.0)

## 2013-03-13 LAB — HEMOGLOBIN A1C: Hgb A1c MFr Bld: 8.1 % — ABNORMAL HIGH (ref 4.6–6.5)

## 2013-03-13 NOTE — Progress Notes (Signed)
Subjective:    Patient ID: Anthony [name], male    DOB: Feb 24, 1946, 67 y.o.   MRN: [id number]  HPI Mr. Lipford presents for follow up. He inquires about CoE-Q-10. He asks if he should take a diuretic - no. Needs to schedule a welcome to medicare exam. He is having pain and swelling of the 4th PIP left hand. He is having crepitus of both knees with decreased range motion.  Past Medical History  Diagnosis Date  . Type II or unspecified type diabetes mellitus without mention of complication, not stated as uncontrolled   . GERD (gastroesophageal reflux disease)   . HTN (hypertension)   . Anxiety   . Rheumatic fever   . Otitis media   . DKA (diabetic ketoacidoses) 01/2010  . Hyperlipidemia    Past Surgical History  Procedure Laterality Date  . Vasectomy    . Cholecystectomy    . Wisdom tooth extraction    . Laparoscopic appendectomy  07/05/2011    Procedure: APPENDECTOMY LAPAROSCOPIC;  Surgeon: Almond Lint, MD;  Location: [facility] OR;  Service: General;  Laterality: N/A;  . Appendectomy     Family History  Problem Relation Age of Onset  . Lymphoma Mother     Non-hodgkins  . Coronary artery disease Mother   . Heart disease Mother   . Cancer Father   . Alcohol abuse Father   . Diabetes Brother   . Pancreatic cancer Maternal Uncle   . Colon cancer Neg Hx   . Lung cancer Neg Hx   . Diabetes Brother   . Diabetes Brother   . Diabetes Other    History   Social History  . Marital Status: Married    Spouse Name: N/A    Number of Children: N/A  . Years of Education: N/A   Occupational History  . Not on file.   Social History Main Topics  . Smoking status: Current Every Day Smoker -- 0.30 packs/day    Types: Cigarettes  . Smokeless tobacco: Never Used     Comment: 4 daily  . Alcohol Use: No     Comment: infrequently a beer  . Drug Use: No  . Sexually Active: Not on file   Other Topics Concern  . Not on file   Social History Narrative   MD ROSTER:   GI - M. Johnson    GS - Dr Maple [name]         10th Grade   Owner/operater of dental lab-sold, but continues to work. Is thinking of fully retiring (02/2010)   Married 1969   2 daughters 1972, 63; 1 son 73   7 grandchildren      Hobby: car restoration: has '66 Vette, two classic Chevelle SS's and is restoring a '37 chevy coup      Regular Exercise -  NO   Daily Caffeine Use:  2          Current Outpatient Prescriptions on File Prior to Visit  Medication Sig Dispense Refill  . ALPRAZolam (NIRAVAM) 0.5 MG dissolvable tablet 1 or 1/2 po qhs prn  30 tablet  5  . Insulin Pen Needle (B-D ULTRAFINE III SHORT PEN) 31G X 8 MM MISC Inject 4 Devices into the skin QID.  100 each  11  . LEVEMIR FLEXPEN 100 UNIT/ML injection INJECT 55 UNITS AT BEDTIME  15 mL  11  . losartan-hydrochlorothiazide (HYZAAR) 100-25 MG per tablet take 1 tablet by mouth once daily  30 tablet  5  . NOVOLOG FLEXPEN  100 UNIT/ML injection INJECT 11 TO 15 UNITS INTO SKIN 3 TIMES DAILY BEFORE MEALS  60 mL  5  . ONE TOUCH ULTRA TEST test strip TEST BLOOD SUGAR 5 TIMES A DAY OR AS DIRECTED BY DOCTOR  450 each  3  . pantoprazole (PROTONIX) 40 MG tablet take 1 tablet by mouth once daily  30 tablet  5  . simvastatin (ZOCOR) 20 MG tablet take 1 tablet by mouth at bedtime  30 tablet  3  . tadalafil (CIALIS) 20 MG tablet Take 0.5-1 tablets (10-20 mg total) by mouth every other day as needed for erectile dysfunction.  5 tablet  11  . tamsulosin (FLOMAX) 0.4 MG CAPS take 1 capsule by mouth once daily  30 capsule  3   No current facility-administered medications on file prior to visit.      Review of Systems System review is negative for any constitutional, cardiac, pulmonary, GI or neuro symptoms or complaints other than as described in the HPI.     Objective:   Physical Exam Filed Vitals:   03/13/13 1308  BP: 130/80  Pulse: 80  Temp: 97.4 F (36.3 C)   Gen'l- WNWD white man in no distress Cor- 2+ radial pulse, RRR PUlm 0- CTAP Ext -  synovial thickening PIP 4th left with preserved ROM. Crepitus with knee movement and tenderness to pressure over the supra-patellar tendon. Neuro - alert and oriented.   Imaging:  Knee Left: Findings: Four views of the left knee submitted. Mild narrowing of  medial joint compartment. No acute fracture or subluxation.  Narrowing of patellofemoral joint space. No joint effusion.  Spurring of patella.  IMPRESSION:  No acute fracture or subluxation. Degenerative changes as  described above.  Knee Right: RIGHT KNEE - COMPLETE 4+ VIEW  Comparison: None.  Findings: Four views of the right knee submitted. No acute  fracture or subluxation. Narrowing of patellofemoral joint space.  No joint effusion.  IMPRESSION:  No acute fracture or subluxation. Minimal degenerative changes.       Assessment & Plan:  Ring finger swelling and pain - early osteoathritis. Plan Warm soak in the morning to reduce stiffness  Use the hand - soft rubber ball, play the piano, etc  As needed Aleve or other non-steroidal drug  Knee pain with crepitus - the presentation suggests chondromalacia - a knee cap problem instead of knee joint problem Plan Bilateral knee x-ray  Mild stretching  Aleve as needed  For worsening pain - ortho consult  DM -  Will check A1C today  Cholesterol and chemistry  For lab today

## 2013-03-13 NOTE — Patient Instructions (Addendum)
Ring finger swelling and pain - early osteoathritis. Plan Warm soak in the morning to reduce stiffness  Use the hand - soft rubber ball, play the piano, etc  As needed Aleve or other non-steroidal drug  Knee pain with crepitus - the presentation suggests chondromalacia - a knee cap problem instead of knee joint problem Plan Bilateral knee x-ray  Mild stretching  Aleve as needed  For worsening pain - ortho consult  DM -  Will check A1C today  Cholesterol and chemistry  For lab today  Schedule an October "Welcome to Medicare" exam.   Chondromalacia Your exam shows your knee pain is likely due to a cartilage swelling and irritation under the knee cap called chondromalacia. The knee cap moves up and down in its groove when you walk, run, or squat. It can become irritated from sports or work activities if the knee cap is not lined up perfectly or your quadriceps muscle is relatively weak. This can cause pain, usually around the knee cap but sometimes the back of the knee. It is most common in young and active people. Climbing stairs, prolonged sitting and rising from a chair will often make the pain worse. Treatment includes rest from activities which make it worse. The pain can be reduced with ice packs and anti-inflammatory pain medicine. Exercises to strengthen the thigh (quadriceps) muscle may help prevent further episodes of this condition. Shoe inserts to correct imbalances in the legs or feet may be prescribed by your doctor or a specialist. Support for the knee cap with a light brace may also be helpful. Call your caregiver if you are not improving after 2 - 3 weeks of treatment.  SEEK MEDICAL CARE IF:  You have increasing pain or your knee becomes hot, swollen, red, or begins to give out or lock up on you. Document Released: 09/21/2004 Document Revised: 11/06/2011 Document Reviewed: 02/09/2009 [facility] Patient Information 2014 [place], [place].

## 2013-03-15 NOTE — Assessment & Plan Note (Signed)
LDL at goal of 100 or less: HDL is low  No change in regimen

## 2013-03-15 NOTE — Assessment & Plan Note (Signed)
Lab Results  Component Value Date   HGBA1C 8.1* 03/13/2013   Stable but not at goal. Will discuss change in regimen at next visit. He is to stick with diet.

## 2013-03-27 ENCOUNTER — Other Ambulatory Visit: Payer: Self-pay | Admitting: Internal Medicine

## 2013-04-29 ENCOUNTER — Other Ambulatory Visit: Payer: Self-pay | Admitting: Internal Medicine

## 2013-05-26 ENCOUNTER — Other Ambulatory Visit: Payer: Self-pay | Admitting: Internal Medicine

## 2013-06-18 ENCOUNTER — Encounter: Payer: Self-pay | Admitting: Internal Medicine

## 2013-06-18 ENCOUNTER — Ambulatory Visit (INDEPENDENT_AMBULATORY_CARE_PROVIDER_SITE_OTHER)
Admission: RE | Admit: 2013-06-18 | Discharge: 2013-06-18 | Disposition: A | Discharge status: Home / Self Care | Payer: No Typology Code available for payment source | Source: Ambulatory Visit | Attending: Internal Medicine | Admitting: Internal Medicine

## 2013-06-18 ENCOUNTER — Ambulatory Visit (INDEPENDENT_AMBULATORY_CARE_PROVIDER_SITE_OTHER): Payer: No Typology Code available for payment source | Admitting: Internal Medicine

## 2013-06-18 ENCOUNTER — Other Ambulatory Visit (INDEPENDENT_AMBULATORY_CARE_PROVIDER_SITE_OTHER): Payer: No Typology Code available for payment source

## 2013-06-18 VITALS — BP 168/100 | HR 79 | Temp 97.5°F | Wt 212.0 lb

## 2013-06-18 DIAGNOSIS — N138 Other obstructive and reflux uropathy: Secondary | ICD-10-CM

## 2013-06-18 DIAGNOSIS — I10 Essential (primary) hypertension: Secondary | ICD-10-CM

## 2013-06-18 DIAGNOSIS — E119 Type 2 diabetes mellitus without complications: Secondary | ICD-10-CM

## 2013-06-18 DIAGNOSIS — N139 Obstructive and reflux uropathy, unspecified: Secondary | ICD-10-CM

## 2013-06-18 DIAGNOSIS — Z23 Encounter for immunization: Secondary | ICD-10-CM

## 2013-06-18 DIAGNOSIS — R7611 Nonspecific reaction to tuberculin skin test without active tuberculosis: Secondary | ICD-10-CM

## 2013-06-18 DIAGNOSIS — N401 Enlarged prostate with lower urinary tract symptoms: Secondary | ICD-10-CM | POA: Insufficient documentation

## 2013-06-18 DIAGNOSIS — E785 Hyperlipidemia, unspecified: Secondary | ICD-10-CM

## 2013-06-18 DIAGNOSIS — Z0000 Encounter for general adult medical examination without abnormal findings: Secondary | ICD-10-CM

## 2013-06-18 LAB — HEPATIC FUNCTION PANEL
ALT: 16 U/L (ref 0–53)
AST: 14 U/L (ref 0–37)
Albumin: 4 g/dL (ref 3.5–5.2)
Alkaline Phosphatase: 76 U/L (ref 39–117)
Bilirubin, Direct: 0.1 mg/dL (ref 0.0–0.3)
Total Protein: 6.1 g/dL (ref 6.0–8.3)

## 2013-06-18 LAB — LIPID PANEL
Cholesterol: 141 mg/dL (ref 0–200)
Triglycerides: 110 mg/dL (ref 0.0–149.0)
VLDL: 22 mg/dL (ref 0.0–40.0)

## 2013-06-18 LAB — COMPREHENSIVE METABOLIC PANEL
AST: 14 U/L (ref 0–37)
Albumin: 4 g/dL (ref 3.5–5.2)
Alkaline Phosphatase: 76 U/L (ref 39–117)
BUN: 17 mg/dL (ref 6–23)
Creatinine, Ser: 0.8 mg/dL (ref 0.4–1.5)
GFR: 101.64 mL/min (ref >60.00)
Sodium: 142 mEq/L (ref 135–145)
Total Protein: 6.1 g/dL (ref 6.0–8.3)

## 2013-06-18 LAB — HEMOGLOBIN A1C: Hgb A1c MFr Bld: 7.2 % — ABNORMAL HIGH (ref 4.6–6.5)

## 2013-06-18 NOTE — Patient Instructions (Signed)
Welcome to [employer].  Sorry about the business stuff. I am sure you will prevail  You exam is notable for loss of sensation in your feet. Be sure to inspect them daily for cuts, bruises, skin tears. Podiatric nail care will be covered by [facility] today with results to MyChart  Chest x-ray in follow up to the positive TB test in high school.  Enlarged prostate - proscar is a good drug. Dr. Vernie Ammons does good surgery should it be needed. Will check a PSA today.  Keep up the good work with diet and definitely good ahead and sign up with [employer].

## 2013-06-18 NOTE — Progress Notes (Signed)
Subjective:    Patient ID: Anthony [name], male    DOB: [birth year], 67 y.o.   MRN: [id number]  HPI The patient is here for annual Medicare wellness examination and management of other chronic and acute problems.  He has had continue BPH-BOO and was just started on Proscar in addition to Flomax. He has also been informed by Dr. Vernie Ammons of surgical options  Diabetes has been variable with frequent low readings, 40-60's, and he has cut back on his novolog.   The risk factors are reflected in the social history.  The roster of all physicians providing medical care to patient - is listed in the Snapshot section of the chart.  Activities of daily living:  The patient is 100% inedpendent in all ADLs: dressing, toileting, feeding as well as independent mobility  Home safety : The patient has smoke detectors in the home. Falls - none.They wear seatbelts.  firearms are present in the home, kept in a safe fashion. There is no violence in the home.   There is no risks for hepatitis, STDs or HIV. There is no history of blood transfusion. They have no travel history to infectious disease endemic areas of the world.  The patient has not seen their dentist in the last six month. They have not seen their eye doctor in the last year. They deny any hearing difficulty and have not had audiologic testing in the last year.    They do not  have excessive sun exposure. Discussed the need for sun protection: hats, long sleeves and use of sunscreen if there is significant sun exposure.   Diet: the importance of a healthy diet is discussed. They do have a healthy diet.  The patient has no regular exercise program.  The benefits of regular aerobic exercise were discussed.  Depression screen: there are no signs or vegative symptoms of depression- irritability, change in appetite, anhedonia, sadness/tearfullness.  Cognitive assessment: the patient manages all their financial and personal affairs and is actively  engaged. He does have some trouble wiThey could relate day,date,year and events; recalled 3/3 objects at 3 minutes; performed clock-face test normally.  The following portions of the patient's history were reviewed and updated as appropriate: allergies, current medications, past family history, past medical history,  past surgical history, past social history  and problem list.  Vision, hearing, body mass index were assessed and reviewed.   During the course of the visit the patient was educated and counseled about appropriate screening and preventive services including : fall prevention , diabetes screening, nutrition counseling, colorectal cancer screening, and recommended immunizations.  Past Medical History  Diagnosis Date  . Type II or unspecified type diabetes mellitus without mention of complication, not stated as uncontrolled   . GERD (gastroesophageal reflux disease)   . HTN (hypertension)   . Anxiety   . Rheumatic fever   . Otitis media   . DKA (diabetic ketoacidoses) 01/2010  . Hyperlipidemia    Past Surgical History  Procedure Laterality Date  . Vasectomy    . Cholecystectomy    . Wisdom tooth extraction    . Laparoscopic appendectomy  07/05/2011    Procedure: APPENDECTOMY LAPAROSCOPIC;  Surgeon: Almond Lint, MD;  Location: [facility] OR;  Service: General;  Laterality: N/A;  . Appendectomy     Family History  Problem Relation Age of Onset  . Lymphoma Mother     Non-hodgkins  . Coronary artery disease Mother   . Heart disease Mother   . Cancer Father   .  Alcohol abuse Father   . Diabetes Brother   . Pancreatic cancer Maternal Uncle   . Colon cancer Neg Hx   . Lung cancer Neg Hx   . Diabetes Brother   . Diabetes Brother   . Diabetes Other    History   Social History  . Marital Status: Married    Spouse Name: N/A    Number of Children: N/A  . Years of Education: N/A   Occupational History  . Not on file.   Social History Main Topics  . Smoking status: Current  Every Day Smoker -- 0.30 packs/day    Types: Cigarettes  . Smokeless tobacco: Never Used     Comment: 4 daily  . Alcohol Use: No     Comment: infrequently a beer  . Drug Use: No  . Sexual Activity: Not on file   Other Topics Concern  . Not on file   Social History Narrative   MD ROSTER:   GI - M. 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 ALPRAZolam (NIRAVAM) 0.5 MG dissolvable tablet 1 or 1/2 po qhs prn  30 tablet  5  . B-D ULTRAFINE III SHORT PEN 31G X 8 MM MISC use as directed  100 each  11  . LEVEMIR FLEXPEN 100 UNIT/ML injection INJECT 55 UNITS AT BEDTIME  15 mL  11  . losartan-hydrochlorothiazide (HYZAAR) 100-25 MG per tablet take 1 tablet by mouth once daily  30 tablet  5  . NOVOLOG FLEXPEN 100 UNIT/ML SOPN FlexPen INJECT 11 TO 15 UNITS INTO THE SKIN 3 TIMES A DAY BEFORE MEALS  3 mL  5  . ONE TOUCH ULTRA TEST test strip TEST as directed  5 TIMES A DAY  450 each  3  . pantoprazole (PROTONIX) 40 MG tablet take 1 tablet by mouth once daily  30 tablet  5  . simvastatin (ZOCOR) 20 MG tablet take 1 tablet by mouth at bedtime  30 tablet  3  . tadalafil (CIALIS) 20 MG tablet Take 0.5-1 tablets (10-20 mg total) by mouth every other day as needed for erectile dysfunction.  5 tablet  11   No current facility-administered medications on file prior to visit.       Review of Systems Constitutional:  Negative for fever, chills, activity change and unexpected weight change.  HEENT:  Negative for hearing loss, ear pain, congestion, neck stiffness and  postnasal drip. Negative for sore throat or swallowing problems. Negative for dental complaints.   Eyes: Negative for vision loss or change in visual acuity.  Respiratory: Negative for chest tightness and wheezing. Negative for DOE.   Cardiovascular: Negative for chest pain or palpitations. No decreased exercise tolerance Gastrointestinal: No change in bowel habit. No bloating or gas. No reflux or indigestion Genitourinary: Negative for urgency, frequency, flank pain and difficulty urinating.  Musculoskeletal: Negative for myalgias, back pain, arthralgias and gait  problem.  Neurological: Negative for dizziness, tremors, weakness and headaches.  Hematological: Negative for adenopathy.  Psychiatric/Behavioral: Negative for behavioral problems and dysphoric mood.       Objective:   Physical Exam Filed Vitals:   06/18/13 1339  BP: 168/100  Pulse: 79  Temp: 97.5 F (36.4 C)   Wt Readings from Last 3 Encounters:  06/18/13 212 lb (96.163 kg)  03/13/13 214 lb (97.07 kg)  04/10/12 210 lb (95.255 kg)   Gen'l: Well nourished well developed     male in no acute distress  HEENT: Head: Normocephalic and atraumatic. Right Ear: External ear normal. EAC/TM nl. Left Ear: External ear normal.  EAC/TM nl. Nose: Nose normal. Mouth/Throat: Oropharynx is clear and moist. Dentition - native, in good repair. No buccal or palatal lesions. Posterior pharynx clear. Eyes: Conjunctivae and sclera clear. EOM intact. Pupils are equal, round, and reactive to light. Right eye exhibits no discharge. Left eye exhibits no discharge. Neck: Normal range of motion. Neck supple. No JVD present. No tracheal deviation present. No thyromegaly present.  Cardiovascular: Normal rate, regular rhythm, no gallop, no friction rub, no murmur heard.      Quiet precordium. 2+ radial and DP pulses . No carotid bruits Pulmonary/Chest: Effort normal. No respiratory distress or increased WOB, no wheezes, no rales. No chest wall deformity or  CVAT. Abdomen: Soft. Bowel sounds are normal in all quadrants. He exhibits no distension, no tenderness, no rebound or guarding, No heptosplenomegaly  Genitourinary:  deferred to Dr. Vernie Ammons Musculoskeletal: Normal range of motion. He exhibits no edema and no tenderness.       Small and large joints without redness, synovial thickening or deformity. Full range of motion preserved about all small, median and large joints.  Lymphadenopathy:    He has no cervical or supraclavicular adenopathy.  Neurological: He is alert and oriented to person, place, and time. CN II-XII intact. DTRs 2+ and symmetrical biceps, radial and patellar tendons. Cerebellar function normal with no tremor, rigidity, normal gait and station. see foot exam. Skin: Skin is warm and dry. No rash noted. No erythema.  Psychiatric: He has a normal mood and affect. His behavior is normal. Thought content normal.   Recent Results (from the past 2160 hour(s))  PSA     Status: None   Collection Time    06/18/13  3:04 PM      Result Value Range   PSA 1.90  0.10 - 4.00 ng/mL  HEMOGLOBIN A1C     Status: Abnormal   Collection Time    06/18/13  3:04 PM      Result Value Range   Hemoglobin A1C 7.2 (*) 4.6 - 6.5 %   Comment: Glycemic Control Guidelines for People with Diabetes:Non Diabetic:  <6%Goal of Therapy: <7%Additional Action Suggested:  >8%   HEPATIC FUNCTION PANEL     Status: None   Collection Time    06/18/13  3:04 PM      Result Value Range   Total Bilirubin 0.9  0.3 - 1.2 mg/dL   Bilirubin, Direct 0.1  0.0 - 0.3 mg/dL   Alkaline Phosphatase 76  39 - 117 U/L   AST 14  0 - 37 U/L   ALT 16  0 - 53 U/L   Total Protein 6.1  6.0 - 8.3 g/dL   Albumin 4.0  3.5 - 5.2 g/dL  COMPREHENSIVE METABOLIC PANEL     Status: Abnormal   Collection Time    06/18/13  3:04 PM  Result Value Range   Sodium 142  135 - 145 mEq/L   Potassium 3.9  3.5 - 5.1 mEq/L   Chloride 104  96 - 112 mEq/L   CO2 29  19 - 32 mEq/L   Glucose, Bld 168  (*) 70 - 99 mg/dL   BUN 17  6 - 23 mg/dL   Creatinine, Ser 0.8  0.4 - 1.5 mg/dL   Total Bilirubin 0.9  0.3 - 1.2 mg/dL   Alkaline Phosphatase 76  39 - 117 U/L   AST 14  0 - 37 U/L   ALT 16  0 - 53 U/L   Total Protein 6.1  6.0 - 8.3 g/dL   Albumin 4.0  3.5 - 5.2 g/dL   Calcium 9.5  8.4 - 16.1 mg/dL   GFR 096.04  >54.09 mL/min  LIPID PANEL     Status: None   Collection Time    06/18/13  3:04 PM      Result Value Range   Cholesterol 141  0 - 200 mg/dL   Comment: ATP III Classification       Desirable:  < 200 mg/dL               Borderline High:  200 - 239 mg/dL          High:  > = 811 mg/dL   Triglycerides 914.7  0.0 - 149.0 mg/dL   Comment: Normal:  <829 mg/dLBorderline High:  150 - 199 mg/dL   HDL 56.21  >30.86 mg/dL   VLDL 57.8  0.0 - 46.9 mg/dL   LDL Cholesterol 79  0 - 99 mg/dL   Total CHOL/HDL Ratio 4     Comment:                Men          Women1/2 Average Risk     3.4          3.3Average Risk          5.0          4.42X Average Risk          9.6          7.13X Average Risk          15.0          11.0                             Assessment & Plan:

## 2013-06-22 DIAGNOSIS — Z0000 Encounter for general adult medical examination without abnormal findings: Secondary | ICD-10-CM | POA: Insufficient documentation

## 2013-06-22 NOTE — Assessment & Plan Note (Signed)
Stable on present regimen. He will follow up with Dr. Vernie Ammons.

## 2013-06-22 NOTE — Assessment & Plan Note (Signed)
Taking and tolerating simvastatin at low dose.  Last LDL 79, HDL 39.6, LFTs normal  Plan Continue present regimen

## 2013-06-22 NOTE — Assessment & Plan Note (Addendum)
Interval history is benign with much better control of DM. Physical exam - normal. Labs reviewed - good control of DM, Lipids and normal labs. He is current with colonoscopy and Discussed pros and cons of prostate cancer screening (USPHCTF recommendations reviewed and ACU April '13 recommendations) and he requests  evaluation at this time- PSA is normal. Immunizations are up to date except he has not had shingles vaccine.  Welcome to Medicare EKG is w.o acute change  In summary A nice man, retired, who is medically stable and doing well. He will return in 6 months for routine care.

## 2013-06-22 NOTE — Assessment & Plan Note (Signed)
He has been taking and tolerating basal insulin treatment and before meal novolog. He has adjusted his medications due to several episodes of hypoglycemia, none serious.  A1C 7.2% - very much improved  Plan  continue present regimen

## 2013-06-22 NOTE — Assessment & Plan Note (Signed)
BP Readings from Last 3 Encounters:  06/18/13 168/100  03/13/13 130/80  04/10/12 134/80   Generally well controlled but elevated today.   Plan Continue present medication  Monitor BP at home and report back via MyChart.

## 2013-07-03 ENCOUNTER — Telehealth: Payer: Self-pay

## 2013-07-03 NOTE — Telephone Encounter (Signed)
Phone call to patient. Received a prior authorization needed for his one touch ultra test strips since he tests 5 times a day ( his ins will not cover that). They will cover 100 test strips every 30 days. Per Dr Debby Bud patient should only be testing 3 times a day.

## 2013-07-03 NOTE — Telephone Encounter (Signed)
Phone call from patient. I advised him Dr Debby Bud states he should be testing his blood sugar three times a day, not five. Pharmacy has been notified of this.

## 2013-07-10 ENCOUNTER — Other Ambulatory Visit: Payer: Self-pay | Admitting: Urology

## 2013-07-16 ENCOUNTER — Encounter (HOSPITAL_COMMUNITY): Payer: Self-pay | Admitting: Pharmacy Technician

## 2013-07-18 ENCOUNTER — Other Ambulatory Visit (HOSPITAL_COMMUNITY): Payer: Self-pay | Admitting: Urology

## 2013-07-18 NOTE — Progress Notes (Signed)
2 view chest xray 06-18-13 epic ekg 06-18-13 epic

## 2013-07-18 NOTE — Patient Instructions (Addendum)
20 Anthony [name]  07/18/2013   Your procedure is scheduled on: Monday December 1st  Report to [facility] at  530 AM.  Call this number if you have problems the morning of surgery [phone]   Remember:   Do not eat food or drink liquids :After Midnight.   Take these medicines the morning of surgery with A SIP OF WATER: protonix                                 SEE [place] PREPARING FOR SURGERY SHEET             You may not have any metal on your body including hair pins and piercings  Do not wear jewelry, make-up.  Do not wear lotions, powders, or perfumes. You may wear deodorant.   Men may shave face and neck.  Do not bring valuables to the hospital. [facility] IS NOT RESPONSIBLE FOR VALUEABLES.  Contacts, dentures or bridgework may not be worn into surgery.  Leave suitcase in the car. After surgery it may be brought to your room.  For patients admitted to the hospital, checkout time is 11:00 AM the day of discharge.   Call Birdie Sons RN pre op nurse if needed [phone]    FAILURE TO FOLLOW THESE INSTRUCTIONS MAY RESULT IN THE CANCELLATION OF YOUR SURGERY.  PATIENT SIGNATURE___________________________________________  NURSE SIGNATURE_____________________________________________

## 2013-07-18 NOTE — H&P (Signed)
History of Present Illness The BPH with outlet obstruction and detrusor instability: I found, by urodynamics, that he had both bladder outlet obstruction secondary to his prostate gland as well as bladder instability. He noted that both of the anticholinergics seem to work for him in decreasing his urinary frequency however he said he did not feel well while he was taking the Detrol. He was able to tolerate the Enablex. He has noted that he is not completely back to the way he used to be when he was a young man but there has been improvement.  Urodynamics 06/02/13: This study revealed outlet obstruction with low amplitude instability.  Treatment: Maximum medical management with tamsulosin 0.8 mg and finasteride.  Erectile dysfunction: This has responded to PDE 5 inhibitors. He has been using Cialis and he inquired about generic sildenafil.   Interval history: He was experiencing hesitancy and his tamsulosin was increased to 2 pills but despite some improvement he remains symptomatic. He wanted to discuss treatment options. He is also having some slight dysuria. No hematuria.   Past Medical History Problems  1. History of balanitis (V13.89) 2. History of diabetes mellitus (V12.29) 3. History of heartburn (V12.79) 4. History of hypercholesterolemia (V12.29) 5. History of hypertension (V12.59)  Surgical History Problems  1. History of Appendectomy 2. History of Circumcision No Clamp/Device/Dorsal Slit Older Than 28 Days 3. History of Gallbladder Surgery  Current Meds 1. Finasteride 5 MG Oral Tablet; Take 1 tablet by mouth every day;  Therapy: 15Oct2014 to (Evaluate:10Oct2015)  Requested for: 15Oct2014; Last  Rx:15Oct2014 Ordered 2. Levemir FlexTouch 100 UNIT/ML Subcutaneous Solution Pen-injector;  Therapy: 06Jan2014 to Recorded 3. Losartan Potassium-HCTZ 100-25 MG Oral Tablet;  Therapy: 02Sep2014 to Recorded 4. NovoLOG 100 UNIT/ML Subcutaneous Solution;  Therapy: (Recorded:16Sep2014)  to Recorded 5. Protonix 40 MG Oral Tablet Delayed Release;  Therapy: (Recorded:16Sep2014) to Recorded 6. Simvastatin 20 MG Oral Tablet;  Therapy: (Recorded:16Sep2014) to Recorded 7. Tamsulosin HCl - 0.4 MG Oral Capsule; TAKE TWO (2) CAPSULES BY MOUTH AT  BEDTIME  Requested for: 15Oct2014; Last Rx:15Oct2014 Ordered  Allergies Medication  1. No Known Drug Allergies  Family History Problems  1. Family history of Cancer 2. Family history of Death In The Family Father : Father   age 34's 3. Family history of Death In The Family Mother : Mother   age 53's 24. Family history of Family Health Status Number Of Children   1 son; 2 daughters 5. Family history of Heart Disease (V17.49) 6. Family history of Hodgkin's Disease : Mother  Social History Problems  1. Denied: Alcohol Use 2. Caffeine Use   4 a day 3. Marital History - Currently Married 4. Occupation:   Geneticist, molecular 5. Tobacco Use (V15.82)   maybe 3-4 cigarettes a day  Review of Systems Genitourinary, constitutional, skin, eye, otolaryngeal, hematologic/lymphatic, cardiovascular, pulmonary, endocrine, musculoskeletal, gastrointestinal, neurological and psychiatric system(s) were reviewed and pertinent findings if present are noted.  Genitourinary: urinary frequency, feelings of urinary urgency, nocturia, difficulty starting the urinary stream, weak urinary stream, urinary stream starts and stops, erectile dysfunction and initiating urination requires straining.  Endocrine: polydipsia.    Vitals Vital Signs Height: 5 ft 11 in Weight: 205 lb  BMI Calculated: 28.59 BSA Calculated: 2.13 Blood Pressure: 131 / 86 Temperature: 97.4 F Heart Rate: 82  Physical Exam Constitutional: Well nourished and well developed . No acute distress.  ENT:. The ears and nose are normal in appearance.  Neck: The appearance of the neck is normal and no neck mass  is present.  Pulmonary: No respiratory distress and normal respiratory rhythm  and effort.  Cardiovascular: Heart rate and rhythm are normal . No peripheral edema.  Abdomen: The abdomen is soft and nontender. No masses are palpated. No CVA tenderness. No hernias are palpable. No hepatosplenomegaly noted.  Rectal: Rectal exam demonstrates normal sphincter tone, no tenderness and no masses. Estimated prostate size is 3+. The prostate has no nodularity and is not tender. The left seminal vesicle is nonpalpable. The right seminal vesicle is nonpalpable. The perineum is normal on inspection.  Genitourinary: Examination of the penis demonstrates no discharge, no masses, no lesions and a normal meatus. The scrotum is without lesions. The right epididymis is palpably normal and non-tender. The left epididymis is palpably normal and non-tender. The right testis is non-tender and without masses. The left testis is non-tender and without masses.  Lymphatics: The femoral and inguinal nodes are not enlarged or tender.  Skin: Normal skin turgor, no visible rash and no visible skin lesions.  Neuro/Psych:. Mood and affect are appropriate.   Urodynamics  The study was performed to evaluate LUTS consisting of urgency with mild urge incontinence, hesitancy and nocturia.  His full urodynamic study was reviewed in its entirety as noted above and in the chart.    I note that on CMG he had his first sensation of filling at only 54 cc and a strong desire to void at 151 cc with what appeared to be low amplitude instability at 255 cc. It appears his volume of approximately 442 cc.  When he attempted to void he was only able to urinate about 57 cc with a maximum flow rate of 2 cc/second and a PVR of 385 cc his voiding was with a detrusor contraction and abdominal straining. His voiding pressures were between 80 and 100 cm H2O. He indicated that this is his typical voiding pattern of strong urge to urinate and a small amounts voided.    Impression: It appears she has significant outlet obstruction  and some low amplitude instability. It would appear he would benefit from outlet reduction either by maximum medical management or surgery. If he undergoes surgery and continues to have persistent irritative symptoms these could then be controlled with anticholinergics.   Assessment Assessed  1. Benign prostatic hyperplasia with urinary obstruction (600.01,599.69)  We discussed the treatment options including medical treatment, CIC, Foley catheterization with a catheter plug and outlet resistance surgery. As far as surgery goes I went over the various forms including laser and standard transurethral resection of the prostate. I went over how the procedure was performed, its risks and complications, the probability of success, the anticipated hospital and post hospital course. He understands and has elected to proceed with a transurethral resection of his prostate.   Plan Benign prostatic hyperplasia with urinary obstruction  1. URINE CULTURE; Status:Hold For - Specimen/Data Collection,Appointment; Requested  for:12Nov2014;  2. Follow-up Schedule Surgery Office  Follow-up  Status: Hold For - Appointment   Requested for: 12Nov2014 Health Maintenance  3. UA With REFLEX; Status:Complete;   Done: 12Nov2014 10:42AM  1. I will culture his urine today.  2.  He will be scheduled for a TURP.

## 2013-07-21 ENCOUNTER — Encounter (HOSPITAL_COMMUNITY)
Admission: RE | Admit: 2013-07-21 | Discharge: 2013-07-21 | Disposition: A | Discharge status: Home / Self Care | Payer: Medicare Other | Source: Ambulatory Visit | Attending: Urology | Admitting: Urology

## 2013-07-21 ENCOUNTER — Encounter (HOSPITAL_COMMUNITY): Payer: Self-pay

## 2013-07-21 DIAGNOSIS — Z01818 Encounter for other preprocedural examination: Secondary | ICD-10-CM | POA: Insufficient documentation

## 2013-07-21 DIAGNOSIS — Z01812 Encounter for preprocedural laboratory examination: Secondary | ICD-10-CM | POA: Insufficient documentation

## 2013-07-21 HISTORY — DX: Unspecified osteoarthritis, unspecified site: M19.90

## 2013-07-21 HISTORY — DX: Depression, unspecified: F32.A

## 2013-07-21 HISTORY — DX: Personal history of other diseases of the respiratory system: Z87.09

## 2013-07-21 HISTORY — DX: Major depressive disorder, single episode, unspecified: F32.9

## 2013-07-21 LAB — BASIC METABOLIC PANEL
BUN: 17 mg/dL (ref 6–23)
CO2: 26 mEq/L (ref 19–32)
Calcium: 9.2 mg/dL (ref 8.4–10.5)
Chloride: 102 mEq/L (ref 96–112)
Creatinine, Ser: 0.74 mg/dL (ref 0.50–1.35)
GFR calc Af Amer: >90 mL/min (ref >90)
GFR calc non Af Amer: >90 mL/min (ref >90)
Glucose, Bld: 127 mg/dL — ABNORMAL HIGH (ref 70–99)
Potassium: 3.4 mEq/L — ABNORMAL LOW (ref 3.5–5.1)
Sodium: 138 mEq/L (ref 135–145)

## 2013-07-21 LAB — CBC
HCT: 43.6 % (ref 39.0–52.0)
Hemoglobin: 15.5 g/dL (ref 13.0–17.0)
MCH: 30.7 pg (ref 26.0–34.0)
MCHC: 35.6 g/dL (ref 30.0–36.0)
MCV: 86.3 fL (ref 78.0–100.0)
Platelets: 204 10*3/uL (ref 150–400)
RBC: 5.05 MIL/uL (ref 4.22–5.81)
RDW: 12.6 % (ref 11.5–15.5)
WBC: 6.1 10*3/uL (ref 4.0–10.5)

## 2013-07-21 NOTE — Progress Notes (Signed)
07/21/13 1008  OBSTRUCTIVE SLEEP APNEA  Have you ever been diagnosed with sleep apnea through a sleep study? No  Do you snore loudly (loud enough to be heard through closed doors)?  1  Do you often feel tired, fatigued, or sleepy during the daytime? 0  Has anyone observed you stop breathing during your sleep? 0  Do you have, or are you being treated for high blood pressure? 1  BMI more than 35 kg/m2? 0  Age over 65 years old? 1  Neck circumference greater than 40 cm/18 inches? 1  Gender: 1  Obstructive Sleep Apnea Score 5  Score 4 or greater  Results sent to PCP

## 2013-07-28 ENCOUNTER — Encounter (HOSPITAL_COMMUNITY): Payer: Self-pay | Admitting: *Deleted

## 2013-07-28 ENCOUNTER — Encounter (HOSPITAL_COMMUNITY)
Admission: RE | Disposition: A | Discharge status: Home / Self Care | Payer: Self-pay | Source: Ambulatory Visit | Attending: Urology

## 2013-07-28 ENCOUNTER — Ambulatory Visit (HOSPITAL_COMMUNITY): Payer: Medicare Other | Admitting: Anesthesiology

## 2013-07-28 ENCOUNTER — Ambulatory Visit (HOSPITAL_COMMUNITY)
Admission: RE | Admit: 2013-07-28 | Discharge: 2013-07-29 | Disposition: A | Discharge status: Home / Self Care | Payer: Medicare Other | Source: Ambulatory Visit | Attending: Urology | Admitting: Urology

## 2013-07-28 ENCOUNTER — Encounter (HOSPITAL_COMMUNITY): Payer: Medicare Other | Admitting: Anesthesiology

## 2013-07-28 DIAGNOSIS — N138 Other obstructive and reflux uropathy: Secondary | ICD-10-CM | POA: Diagnosis present

## 2013-07-28 DIAGNOSIS — Z794 Long term (current) use of insulin: Secondary | ICD-10-CM | POA: Insufficient documentation

## 2013-07-28 DIAGNOSIS — I10 Essential (primary) hypertension: Secondary | ICD-10-CM | POA: Insufficient documentation

## 2013-07-28 DIAGNOSIS — N401 Enlarged prostate with lower urinary tract symptoms: Secondary | ICD-10-CM | POA: Insufficient documentation

## 2013-07-28 DIAGNOSIS — E7800 Pure hypercholesterolemia, unspecified: Secondary | ICD-10-CM | POA: Insufficient documentation

## 2013-07-28 DIAGNOSIS — Z87891 Personal history of nicotine dependence: Secondary | ICD-10-CM | POA: Insufficient documentation

## 2013-07-28 DIAGNOSIS — K219 Gastro-esophageal reflux disease without esophagitis: Secondary | ICD-10-CM | POA: Insufficient documentation

## 2013-07-28 DIAGNOSIS — Z79899 Other long term (current) drug therapy: Secondary | ICD-10-CM | POA: Insufficient documentation

## 2013-07-28 DIAGNOSIS — E119 Type 2 diabetes mellitus without complications: Secondary | ICD-10-CM | POA: Insufficient documentation

## 2013-07-28 DIAGNOSIS — N320 Bladder-neck obstruction: Secondary | ICD-10-CM | POA: Insufficient documentation

## 2013-07-28 DIAGNOSIS — R350 Frequency of micturition: Secondary | ICD-10-CM | POA: Insufficient documentation

## 2013-07-28 DIAGNOSIS — N318 Other neuromuscular dysfunction of bladder: Secondary | ICD-10-CM | POA: Insufficient documentation

## 2013-07-28 DIAGNOSIS — N529 Male erectile dysfunction, unspecified: Secondary | ICD-10-CM | POA: Insufficient documentation

## 2013-07-28 HISTORY — PX: TRANSURETHRAL RESECTION OF PROSTATE: SHX73

## 2013-07-28 LAB — GLUCOSE, CAPILLARY
Glucose-Capillary: 204 mg/dL — ABNORMAL HIGH (ref 70–99)
Glucose-Capillary: 333 mg/dL — ABNORMAL HIGH (ref 70–99)
Glucose-Capillary: 304 mg/dL — ABNORMAL HIGH (ref 70–99)

## 2013-07-28 SURGERY — TRANSURETHRAL RESECTION OF THE PROSTATE WITH GYRUS INSTRUMENTS
Anesthesia: General | Wound class: Clean Contaminated

## 2013-07-28 MED ORDER — CLONIDINE HCL 0.2 MG PO TABS
0.2000 mg | ORAL_TABLET | Freq: Two times a day (BID) | ORAL | Status: DC
  Administered 2013-07-28 – 2013-07-29 (×2): 0.2 mg via ORAL
  Filled 2013-07-28 (×3): qty 1

## 2013-07-28 MED ORDER — LACTATED RINGERS IV SOLN
INTRAVENOUS | Status: DC | PRN
  Administered 2013-07-28 (×2): via INTRAVENOUS

## 2013-07-28 MED ORDER — OXYCODONE HCL 5 MG/5ML PO SOLN
5.0000 mg | Freq: Once | ORAL | Status: DC | PRN
  Filled 2013-07-28: qty 5

## 2013-07-28 MED ORDER — CIPROFLOXACIN IN D5W 400 MG/200ML IV SOLN
400.0000 mg | INTRAVENOUS | Status: AC
  Administered 2013-07-28: 400 mg via INTRAVENOUS

## 2013-07-28 MED ORDER — HYDROCODONE-ACETAMINOPHEN 5-325 MG PO TABS
1.0000 | ORAL_TABLET | ORAL | Status: DC | PRN
  Administered 2013-07-29: 1 via ORAL
  Filled 2013-07-28: qty 1

## 2013-07-28 MED ORDER — BELLADONNA ALKALOIDS-OPIUM 16.2-60 MG RE SUPP: 1.0000 | Freq: Four times a day (QID) | RECTAL | Status: DC | PRN

## 2013-07-28 MED ORDER — ONDANSETRON HCL 4 MG/2ML IJ SOLN
INTRAMUSCULAR | Status: AC
  Filled 2013-07-28: qty 2

## 2013-07-28 MED ORDER — BACITRACIN-NEOMYCIN-POLYMYXIN 400-5-5000 EX OINT: 1.0000 "application " | TOPICAL_OINTMENT | Freq: Three times a day (TID) | CUTANEOUS | Status: DC | PRN

## 2013-07-28 MED ORDER — LIDOCAINE HCL 1 % IJ SOLN
INTRAMUSCULAR | Status: DC | PRN
  Administered 2013-07-28: 100 mg via INTRADERMAL

## 2013-07-28 MED ORDER — FENTANYL CITRATE 0.05 MG/ML IJ SOLN
INTRAMUSCULAR | Status: AC
  Filled 2013-07-28: qty 2

## 2013-07-28 MED ORDER — SODIUM CHLORIDE 0.9 % IR SOLN
Status: DC | PRN
  Administered 2013-07-28: 26000 mL via INTRAVESICAL

## 2013-07-28 MED ORDER — HYDROMORPHONE HCL PF 1 MG/ML IJ SOLN
INTRAMUSCULAR | Status: AC
  Filled 2013-07-28: qty 1

## 2013-07-28 MED ORDER — DOCUSATE SODIUM 100 MG PO CAPS
100.0000 mg | ORAL_CAPSULE | Freq: Two times a day (BID) | ORAL | Status: DC
  Administered 2013-07-28 – 2013-07-29 (×2): 100 mg via ORAL
  Filled 2013-07-28 (×3): qty 1

## 2013-07-28 MED ORDER — EPHEDRINE SULFATE 50 MG/ML IJ SOLN
INTRAMUSCULAR | Status: AC
  Filled 2013-07-28: qty 1

## 2013-07-28 MED ORDER — INSULIN ASPART 100 UNIT/ML ~~LOC~~ SOLN
0.0000 [IU] | SUBCUTANEOUS | Status: DC
  Administered 2013-07-28 (×2): 11 [IU] via SUBCUTANEOUS
  Administered 2013-07-28 – 2013-07-29 (×4): 3 [IU] via SUBCUTANEOUS

## 2013-07-28 MED ORDER — FENTANYL CITRATE 0.05 MG/ML IJ SOLN
INTRAMUSCULAR | Status: DC | PRN
  Administered 2013-07-28 (×2): 50 ug via INTRAVENOUS

## 2013-07-28 MED ORDER — HYDROMORPHONE HCL PF 1 MG/ML IJ SOLN
INTRAMUSCULAR | Status: DC | PRN
  Administered 2013-07-28: 0.5 mg via INTRAVENOUS

## 2013-07-28 MED ORDER — ACETAMINOPHEN 325 MG PO TABS: 650.0000 mg | ORAL_TABLET | ORAL | Status: DC | PRN

## 2013-07-28 MED ORDER — MEPERIDINE HCL 50 MG/ML IJ SOLN: 6.2500 mg | INTRAMUSCULAR | Status: DC | PRN

## 2013-07-28 MED ORDER — PROPOFOL 10 MG/ML IV BOLUS
INTRAVENOUS | Status: AC
  Filled 2013-07-28: qty 20

## 2013-07-28 MED ORDER — HYDRALAZINE HCL 20 MG/ML IJ SOLN
INTRAMUSCULAR | Status: AC
  Filled 2013-07-28: qty 1

## 2013-07-28 MED ORDER — ONDANSETRON HCL 4 MG/2ML IJ SOLN: 4.0000 mg | INTRAMUSCULAR | Status: DC | PRN

## 2013-07-28 MED ORDER — MIDAZOLAM HCL 2 MG/2ML IJ SOLN
INTRAMUSCULAR | Status: AC
  Filled 2013-07-28: qty 2

## 2013-07-28 MED ORDER — HYDRALAZINE HCL 20 MG/ML IJ SOLN
5.0000 mg | INTRAMUSCULAR | Status: DC | PRN
  Administered 2013-07-28 (×3): 5 mg via INTRAVENOUS

## 2013-07-28 MED ORDER — OXYCODONE HCL 5 MG PO TABS: 5.0000 mg | ORAL_TABLET | Freq: Once | ORAL | Status: DC | PRN

## 2013-07-28 MED ORDER — ONDANSETRON HCL 4 MG/2ML IJ SOLN
INTRAMUSCULAR | Status: DC | PRN
  Administered 2013-07-28: 4 mg via INTRAVENOUS

## 2013-07-28 MED ORDER — HYDROMORPHONE HCL PF 1 MG/ML IJ SOLN
0.5000 mg | INTRAMUSCULAR | Status: DC | PRN
  Administered 2013-07-28 – 2013-07-29 (×4): 1 mg via INTRAVENOUS
  Filled 2013-07-28 (×4): qty 1

## 2013-07-28 MED ORDER — HYDROMORPHONE HCL PF 2 MG/ML IJ SOLN
INTRAMUSCULAR | Status: AC
  Filled 2013-07-28: qty 1

## 2013-07-28 MED ORDER — SODIUM CHLORIDE 0.9 % IV SOLN
INTRAVENOUS | Status: DC
  Administered 2013-07-28 – 2013-07-29 (×2): via INTRAVENOUS

## 2013-07-28 MED ORDER — SODIUM CHLORIDE 0.9 % IR SOLN
1000.0000 mL | Status: DC
  Administered 2013-07-28 (×4): 1000 mL

## 2013-07-28 MED ORDER — ZOLPIDEM TARTRATE 5 MG PO TABS
5.0000 mg | ORAL_TABLET | Freq: Every evening | ORAL | Status: DC | PRN
  Administered 2013-07-28: 5 mg via ORAL
  Filled 2013-07-28: qty 1

## 2013-07-28 MED ORDER — LIDOCAINE HCL (CARDIAC) 20 MG/ML IV SOLN
INTRAVENOUS | Status: AC
  Filled 2013-07-28: qty 5

## 2013-07-28 MED ORDER — SODIUM CHLORIDE 0.9 % IJ SOLN
INTRAMUSCULAR | Status: AC
  Filled 2013-07-28: qty 10

## 2013-07-28 MED ORDER — MIDAZOLAM HCL 5 MG/5ML IJ SOLN
INTRAMUSCULAR | Status: DC | PRN
  Administered 2013-07-28: 2 mg via INTRAVENOUS

## 2013-07-28 MED ORDER — PROMETHAZINE HCL 25 MG/ML IJ SOLN: 6.2500 mg | INTRAMUSCULAR | Status: DC | PRN

## 2013-07-28 MED ORDER — CIPROFLOXACIN IN D5W 400 MG/200ML IV SOLN
INTRAVENOUS | Status: AC
  Filled 2013-07-28: qty 200

## 2013-07-28 MED ORDER — PANTOPRAZOLE SODIUM 40 MG PO TBEC
40.0000 mg | DELAYED_RELEASE_TABLET | Freq: Every day | ORAL | Status: DC
  Administered 2013-07-29: 40 mg via ORAL
  Filled 2013-07-28: qty 1

## 2013-07-28 MED ORDER — HYDROMORPHONE HCL PF 1 MG/ML IJ SOLN
0.2500 mg | INTRAMUSCULAR | Status: DC | PRN
  Administered 2013-07-28 (×2): 0.25 mg via INTRAVENOUS
  Administered 2013-07-28: 0.5 mg via INTRAVENOUS

## 2013-07-28 MED ORDER — PROPOFOL 10 MG/ML IV BOLUS
INTRAVENOUS | Status: DC | PRN
  Administered 2013-07-28: 50 mg via INTRAVENOUS
  Administered 2013-07-28: 70 mg via INTRAVENOUS
  Administered 2013-07-28: 200 mg via INTRAVENOUS

## 2013-07-28 MED ORDER — 0.9 % SODIUM CHLORIDE (POUR BTL) OPTIME
TOPICAL | Status: DC | PRN
  Administered 2013-07-28: 1000 mL

## 2013-07-28 MED ORDER — LIDOCAINE HCL 2 % EX GEL
CUTANEOUS | Status: AC
  Filled 2013-07-28: qty 10

## 2013-07-28 MED ORDER — CIPROFLOXACIN IN D5W 400 MG/200ML IV SOLN
400.0000 mg | Freq: Two times a day (BID) | INTRAVENOUS | Status: AC
  Administered 2013-07-28 – 2013-07-29 (×2): 400 mg via INTRAVENOUS
  Filled 2013-07-28 (×2): qty 200

## 2013-07-28 MED ORDER — EPHEDRINE SULFATE 50 MG/ML IJ SOLN
INTRAMUSCULAR | Status: DC | PRN
  Administered 2013-07-28: 5 mg via INTRAVENOUS
  Administered 2013-07-28: 10 mg via INTRAVENOUS
  Administered 2013-07-28 (×4): 5 mg via INTRAVENOUS

## 2013-07-28 SURGICAL SUPPLY — 24 items
BAG URINE DRAINAGE (UROLOGICAL SUPPLIES) IMPLANT
BAG URO CATCHER STRL LF (DRAPE) ×2 IMPLANT
BLADE SURG 15 STRL LF DISP TIS (BLADE) IMPLANT
BLADE SURG 15 STRL SS (BLADE)
CATH FOLEY 3WAY 30CC 24FR (CATHETERS) ×2
CATH URTH STD 24FR FL 3W 2 (CATHETERS) ×1 IMPLANT
DRAPE CAMERA CLOSED 9X96 (DRAPES) ×2 IMPLANT
ELECT BUTTON HF 24-28F 2 30DE (ELECTRODE) IMPLANT
ELECT HF RESECT BIPO 24F 45 ND (CUTTING LOOP) IMPLANT
ELECT LOOP MED HF 24F 12D CBL (CLIP) ×1 IMPLANT
ELECT REM PT RETURN 9FT ADLT (ELECTROSURGICAL) ×2
ELECT RESECT VAPORIZE 12D CBL (ELECTRODE) IMPLANT
ELECTRODE REM PT RTRN 9FT ADLT (ELECTROSURGICAL) ×1 IMPLANT
EVACUATOR MICROVAS BLADDER (UROLOGICAL SUPPLIES) ×2 IMPLANT
GLOVE BIOGEL M 8.0 STRL (GLOVE) ×2 IMPLANT
GOWN STRL REIN XL XLG (GOWN DISPOSABLE) ×2 IMPLANT
IV NS IRRIG 3000ML ARTHROMATIC (IV SOLUTION) ×4 IMPLANT
KIT ASPIRATION TUBING (SET/KITS/TRAYS/PACK) ×2 IMPLANT
MANIFOLD NEPTUNE II (INSTRUMENTS) ×2 IMPLANT
NS IRRIG 1000ML POUR BTL (IV SOLUTION) ×2 IMPLANT
PACK CYSTO (CUSTOM PROCEDURE TRAY) ×2 IMPLANT
SUT ETHILON 3 0 PS 1 (SUTURE) IMPLANT
SYR 30ML LL (SYRINGE) ×1 IMPLANT
TUBING CONNECTING 10 (TUBING) ×2 IMPLANT

## 2013-07-28 NOTE — Progress Notes (Signed)
Dr. Renold Don aware of BP 180/96 after pain meds. Has not taken home BP med since Sat PM. New orders given.

## 2013-07-28 NOTE — Transfer of Care (Signed)
Immediate Anesthesia Transfer of Care Note  Patient: Anthony [name]  Procedure(s) Performed: Procedure(s): TRANSURETHRAL RESECTION OF THE PROSTATE WITH GYRUS INSTRUMENTS (N/A)  Patient Location: PACU  Anesthesia Type:General  Level of Consciousness: awake, alert  and oriented  Airway & Oxygen Therapy: Patient Spontanous Breathing and Patient connected to face mask oxygen  Post-op Assessment: Report given to PACU RN and Post -op Vital signs reviewed and stable  Post vital signs: Reviewed and stable  Complications: No apparent anesthesia complications

## 2013-07-28 NOTE — Anesthesia Postprocedure Evaluation (Signed)
Anesthesia Post Note  Patient: Anthony [name]  Procedure(s) Performed: Procedure(s) (LRB): TRANSURETHRAL RESECTION OF THE PROSTATE WITH GYRUS INSTRUMENTS (N/A)  Anesthesia type: General  Patient location: PACU  Post pain: Pain level controlled  Post assessment: Post-op Vital signs reviewed  Last Vitals:  Filed Vitals:   07/28/13 1110  BP: 169/83  Pulse: 96  Temp:   Resp: 16    Post vital signs: Reviewed  Level of consciousness: sedated  Complications: No apparent anesthesia complications

## 2013-07-28 NOTE — Preoperative (Signed)
Beta Blockers   Reason not to administer Beta Blockers:Not Applicable 

## 2013-07-28 NOTE — Interval H&P Note (Signed)
History and Physical Interval Note:  07/28/2013 7:22 AM  Anthony [name]  has presented today for surgery, with the diagnosis of BPH with Outlet Obstruction  The various methods of treatment have been discussed with the patient and family. After consideration of risks, benefits and other options for treatment, the patient has consented to  Procedure(s): TRANSURETHRAL RESECTION OF THE PROSTATE WITH GYRUS INSTRUMENTS (N/A) as a surgical intervention .  The patient's history has been reviewed, patient examined, no change in status, stable for surgery.  I have reviewed the patient's chart and labs.  Questions were answered to the patient's satisfaction.    His urine culture was negative.   Garnett Farm

## 2013-07-28 NOTE — Op Note (Signed)
PATIENT:  Anthony [name]  PRE-OPERATIVE DIAGNOSIS: BPH with outlet obstruction  POST-OPERATIVE DIAGNOSIS: Same  PROCEDURE: TURP  SURGEON:  Garnett Farm  INDICATION: Anthony [name] is a 67 year old male with a history of BPH and lower urinary tract symptoms. He had been managed with tamsulosin 0.8 mg and finasteride but remains symptomatic and we discussed the treatment options. He elected to proceed with surgical correction of his outlet obstruction with TURP.  ANESTHESIA:  General  EBL:  Minimal  DRAINS: 24 [place], three-way Foley  LOCAL MEDICATIONS USED:  None  SPECIMEN:  Prostate chips to pathology  Description of procedure: After informed consent the patient was brought to the major or, placed on the table and administered general anesthesia. He was then moved to the dorsal lithotomy position, his genitalia was sterilely prepped and draped and an official timeout was then performed.  The 24 French resectoscope with visual obturator was then passed down the urethra which was noted be normal. The prostatic urethra was noted to have significant bilobar hypertrophy with obstruction and elongation of the prostatic urethra. The bladder was then entered and fully and systematically inspected. No tumors, stones or inflammatory lesions were identified within the bladder. The ureteral orifices were noted to be well away from the bladder neck. There was 1+ trabeculation of the bladder.  Resection of the adenomatous tissue was undertaken by first resecting at the 5:00 position from the bladder neck back to the level of the figure room. I then resected at the 7:00 position. This allowed good control of the blood supply to the prostate. I then resected the left lobe from the 6:00 position counterclockwise to the 12:00 position from the bladder neck back to the level of the veru. I then resected the right lobe in a similar fashion. Apical tissue was resected with care being taken to remain  proximal to the veru at all times. After all adenomatous tissue was resected down to surgical capsule bleeding points were cauterized and the Microvasive evacuator was used to remove all prostatic chips from the bladder. The bladder was then reinspected and the ureteral orifices were noted to be intact. The prostatic capsule was intact and was widely resected. There was no active arterial bleeding noted. I therefore removed the resectoscope with the bladder full and noted a good stream with suprapubic pressure. The 24 French three-way Foley catheter was then placed and the balloon was filled with 30 cc of sterile water. This was irrigated and initially the irrigant returned slightly pink. I therefore placed the catheter on mild traction and irrigated again and the irrigant returned clear. This was connected to closed system drainage as well as continuous bladder irrigation and the patient was awakened and taken to recovery room in stable and satisfactory condition. He tolerated the procedure well no intraoperative complications.  PLAN OF CARE: He will be observed overnight with anticipation of discharge in the morning.  PATIENT DISPOSITION:  PACU - hemodynamically stable.

## 2013-07-28 NOTE — Anesthesia Preprocedure Evaluation (Addendum)
Anesthesia Evaluation  Patient identified by MRN, date of birth, ID band Patient awake    Reviewed: Allergy & Precautions, H&P , NPO status , Patient's Chart, lab work & pertinent test results  Airway Mallampati: III TM Distance: >3 FB Neck ROM: Full    Dental  (+) Teeth Intact and Dental Advisory Given   Pulmonary former smoker,    Pulmonary exam normal       Cardiovascular hypertension, Pt. on medications negative cardio ROS  Rhythm:Regular Rate:Normal     Neuro/Psych PSYCHIATRIC DISORDERS Anxiety Depression negative neurological ROS     GI/Hepatic negative GI ROS, Neg liver ROS, GERD-  Medicated,  Endo/Other  diabetes, Type 2, Insulin Dependent  Renal/GU negative Renal ROS     Musculoskeletal negative musculoskeletal ROS (+)   Abdominal   Peds  Hematology negative hematology ROS (+)   Anesthesia Other Findings   Reproductive/Obstetrics                         Anesthesia Physical  Anesthesia Plan  ASA: III  Anesthesia Plan: General   Post-op Pain Management:    Induction: Intravenous  Airway Management Planned: LMA  Additional Equipment:   Intra-op Plan:   Post-operative Plan: Extubation in OR  Informed Consent: I have reviewed the patients History and Physical, chart, labs and discussed the procedure including the risks, benefits and alternatives for the proposed anesthesia with the patient or authorized representative who has indicated his/her understanding and acceptance.   Dental advisory given  Plan Discussed with: CRNA  Anesthesia Plan Comments:         Anesthesia Quick Evaluation

## 2013-07-29 ENCOUNTER — Encounter (HOSPITAL_COMMUNITY): Payer: Self-pay | Admitting: Urology

## 2013-07-29 LAB — GLUCOSE, CAPILLARY
Glucose-Capillary: 144 mg/dL — ABNORMAL HIGH (ref 70–99)
Glucose-Capillary: 155 mg/dL — ABNORMAL HIGH (ref 70–99)
Glucose-Capillary: 162 mg/dL — ABNORMAL HIGH (ref 70–99)
Glucose-Capillary: 165 mg/dL — ABNORMAL HIGH (ref 70–99)
Glucose-Capillary: 184 mg/dL — ABNORMAL HIGH (ref 70–99)

## 2013-07-29 MED ORDER — PHENAZOPYRIDINE HCL 200 MG PO TABS: 200.0000 mg | ORAL_TABLET | Freq: Three times a day (TID) | ORAL | Status: DC | PRN

## 2013-07-29 MED ORDER — HYDROCODONE-ACETAMINOPHEN 10-325 MG PO TABS: 1.0000 | ORAL_TABLET | Freq: Four times a day (QID) | ORAL | Status: DC | PRN

## 2013-07-29 NOTE — Discharge Summary (Signed)
Physician Discharge Summary  Patient ID: [name] MRN: [id number] DOB/AGE: Apr 09, 1946 67 y.o.  Admit date: 07/28/2013 Discharge date: 07/29/2013  Admission Diagnoses:  Discharge Diagnoses:  Active Problems:   BPH (benign prostatic hypertrophy) with urinary obstruction   Discharged Condition: good  Hospital Course: He was admitted electively for transurethral resection of his prostate due to bladder outlet obstruction that did not respond to maximum medical management. He was taken to the operating room and underwent a TURP without complication. Postoperatively he did well with minimal pain. His urine cleared overnight with continuous bladder irrigation and his catheter was removed in the morning. He had no difficulty urinating preoperatively so it's felt he should have no difficulty now that his catheter has been removed however he will be observed until he voided spontaneously and then discharged home.  Consults: None  Discharge Exam: Blood pressure 153/80, pulse 69, temperature 97.6 F (36.4 C), temperature source Oral, resp. rate 20, height 5' 11.5" (1.816 m), weight 95.255 kg (210 lb), SpO2 98.00%. General appearance: alert, cooperative and no distress GI: soft, non-tender; bowel sounds normal; no masses,  no organomegaly  Disposition: 01-Home or Self Care  Discharge Orders   Future Orders Complete By Expires   Discharge patient  As directed        Medication List    STOP taking these medications       finasteride 5 MG tablet  Commonly known as:  PROSCAR     tamsulosin 0.4 MG Caps capsule  Commonly known as:  FLOMAX      TAKE these medications       HYDROcodone-acetaminophen 10-325 MG per tablet  Commonly known as:  NORCO  Take 1 tablet by mouth every 6 (six) hours as needed.     LEVEMIR FLEXPEN 100 UNIT/ML Sopn  Generic drug:  Insulin Detemir  INJECT 55 UNITS AT BEDTIME     losartan-hydrochlorothiazide 100-25 MG per tablet  Commonly known as:   HYZAAR  Take 1 tablet by mouth every morning.     NOVOLOG FLEXPEN 100 UNIT/ML Sopn FlexPen  Generic drug:  insulin aspart  INJECT 11 TO 15 UNITS INTO THE SKIN 3 TIMES A DAY BEFORE MEALS     pantoprazole 40 MG tablet  Commonly known as:  PROTONIX  take 1 tablet by mouth once daily     phenazopyridine 200 MG tablet  Commonly known as:  PYRIDIUM  Take 1 tablet (200 mg total) by mouth 3 (three) times daily as needed for pain.     simvastatin 20 MG tablet  Commonly known as:  ZOCOR  take 1 tablet by mouth at bedtime     tadalafil 20 MG tablet  Commonly known as:  CIALIS  Take 0.5-1 tablets (10-20 mg total) by mouth every other day as needed for erectile dysfunction.           Follow-up Information   Follow up with Garnett Farm, MD On 08/05/2013. (at 11:00)    Specialty:  Urology   Contact information:   [address] [place] [address] [phone]       Signed: Garnett Farm 07/29/2013, 6:38 AM

## 2013-07-31 ENCOUNTER — Other Ambulatory Visit: Payer: Self-pay | Admitting: Internal Medicine

## 2013-10-20 ENCOUNTER — Other Ambulatory Visit (INDEPENDENT_AMBULATORY_CARE_PROVIDER_SITE_OTHER): Payer: No Typology Code available for payment source

## 2013-10-20 ENCOUNTER — Telehealth: Payer: Self-pay

## 2013-10-20 DIAGNOSIS — E119 Type 2 diabetes mellitus without complications: Secondary | ICD-10-CM

## 2013-10-20 LAB — HEMOGLOBIN A1C: Hgb A1c MFr Bld: 7.6 % — ABNORMAL HIGH (ref 4.6–6.5)

## 2013-10-20 NOTE — Telephone Encounter (Signed)
Lab called stating patient states he's to have his a1c checked. Order has been placed.

## 2013-10-26 ENCOUNTER — Other Ambulatory Visit: Payer: Self-pay | Admitting: Internal Medicine

## 2013-11-17 ENCOUNTER — Encounter: Payer: Self-pay | Admitting: Internal Medicine

## 2013-11-17 ENCOUNTER — Ambulatory Visit (INDEPENDENT_AMBULATORY_CARE_PROVIDER_SITE_OTHER): Payer: No Typology Code available for payment source | Admitting: Internal Medicine

## 2013-11-17 VITALS — BP 158/92 | HR 78 | Temp 97.8°F | Wt 209.4 lb

## 2013-11-17 DIAGNOSIS — E119 Type 2 diabetes mellitus without complications: Secondary | ICD-10-CM

## 2013-11-17 DIAGNOSIS — I10 Essential (primary) hypertension: Secondary | ICD-10-CM

## 2013-11-17 MED ORDER — AMLODIPINE BESYLATE 5 MG PO TABS: 5.0000 mg | ORAL_TABLET | Freq: Every day | ORAL | Status: DC

## 2013-11-17 NOTE — Progress Notes (Signed)
Pre visit review using our clinic review tool, if applicable. No additional management support is needed unless otherwise documented below in the visit note. 

## 2013-11-17 NOTE — Assessment & Plan Note (Signed)
BP has been running high and there is a question of him being symptomatic.  Plan Will continue ARB/diretic  Add low dose CCB - amlodipine 5 mg daily. Advised he may have some peripheral edema.

## 2013-11-17 NOTE — Progress Notes (Signed)
   Subjective:    Patient ID: Anthony [name], male    DOB: 05-Jun-1946, 68 y.o.   MRN: [id number]  HPI Anthony [name] presents for elevated BP: home readings SBP consistently elevated above 150. He has had one or two episodes where he would feel odd, blurred vision and is concerned that this is BP related.   Diabetes - he has had some low episodes associated with unusual physical exertion.  PMH, FamHx and SocHx reviewed for any changes and relevance.  Current Outpatient Prescriptions on File Prior to Visit  Medication Sig Dispense Refill  . LEVEMIR FLEXTOUCH 100 UNIT/ML SOPN INJECT 55 UNITS AT BEDTIME  15 mL  11  . losartan-hydrochlorothiazide (HYZAAR) 100-25 MG per tablet TAKE 1 TABLET BY MOUTH ONCE A DAY  30 tablet  5  . NOVOLOG FLEXPEN 100 UNIT/ML SOPN FlexPen INJECT 11 TO 15 UNITS INTO THE SKIN 3 TIMES A DAY BEFORE MEALS  3 mL  5  . pantoprazole (PROTONIX) 40 MG tablet take 1 tablet by mouth once daily  30 tablet  5  . phenazopyridine (PYRIDIUM) 200 MG tablet Take 1 tablet (200 mg total) by mouth 3 (three) times daily as needed for pain.  30 tablet  0  . simvastatin (ZOCOR) 20 MG tablet take 1 tablet by mouth at bedtime  30 tablet  3  . tadalafil (CIALIS) 20 MG tablet Take 0.5-1 tablets (10-20 mg total) by mouth every other day as needed for erectile dysfunction.  5 tablet  11   No current facility-administered medications on file prior to visit.      Review of Systems System review is negative for any constitutional, cardiac, pulmonary, GI or neuro symptoms or complaints other than as described in the HPI.     Objective:   Physical Exam Filed Vitals:   11/17/13 0814  BP: 158/92  Pulse: 78  Temp: 97.8 F (36.6 C)   gen'l- WNWD man in no distress HEENT- C&S clear Cor - 2+ RRR Pul Normal respirations Neuro - A&O x 3 e       Assessment & Plan:

## 2013-11-17 NOTE — Patient Instructions (Signed)
Good to see you.  1. Blood pressure - running a bit high but not in the danger zone. Goal is systolic BP  846 or less, diastolic BP 90 or less Plan Continue present medication  Add amlodipine 5 mg once a day. There may be some peripheral edema during the first couple of week.s  2. Diabetes - continue your medications. Plan Last A1C 7.6% Feb 23rd - repeat lab June.  Keep glucose tabs on your person  If you know you are going to do unusual physical activity have a snack or a larger meal.   Your care will be transferred to Dr. Danise Mina at [facility]

## 2013-11-17 NOTE — Assessment & Plan Note (Signed)
He has had some low episodes especially associated with increased physical exercise.  Lab Results  Component Value Date   HGBA1C 7.6* 10/20/2013   Plan He will continue his present medication. Snack or have a bigger meal if he knows he will engage in physical activity, keep glucose tabs with him.  Follow up A1C June.

## 2013-11-18 ENCOUNTER — Telehealth: Payer: Self-pay | Admitting: Internal Medicine

## 2013-11-18 NOTE — Telephone Encounter (Signed)
Relevant patient education assigned to patient using Emmi. ° °

## 2013-11-23 ENCOUNTER — Other Ambulatory Visit: Payer: Self-pay | Admitting: Internal Medicine

## 2013-11-25 ENCOUNTER — Telehealth: Payer: Self-pay

## 2013-11-25 NOTE — Telephone Encounter (Signed)
Relevant patient education assigned to patient using Emmi. ° °

## 2013-12-25 ENCOUNTER — Ambulatory Visit (INDEPENDENT_AMBULATORY_CARE_PROVIDER_SITE_OTHER): Payer: No Typology Code available for payment source | Admitting: Family Medicine

## 2013-12-25 ENCOUNTER — Encounter: Payer: Self-pay | Admitting: Family Medicine

## 2013-12-25 VITALS — BP 122/76 | HR 96 | Temp 97.8°F | Wt 208.1 lb

## 2013-12-25 DIAGNOSIS — E785 Hyperlipidemia, unspecified: Secondary | ICD-10-CM

## 2013-12-25 DIAGNOSIS — I10 Essential (primary) hypertension: Secondary | ICD-10-CM

## 2013-12-25 DIAGNOSIS — E119 Type 2 diabetes mellitus without complications: Secondary | ICD-10-CM

## 2013-12-25 DIAGNOSIS — R0989 Other specified symptoms and signs involving the circulatory and respiratory systems: Secondary | ICD-10-CM

## 2013-12-25 NOTE — Assessment & Plan Note (Signed)
Tolerating statin well. Continue.

## 2013-12-25 NOTE — Progress Notes (Signed)
BP 122/76  Pulse 96  Temp(Src) 97.8 F (36.6 C) (Oral)  Wt 208 lb 1.9 oz (94.403 kg)   CC: 1 mo f/u, transfer of care Subjective:    Patient ID: Anthony [name], male    DOB: 08/25/1946, 68 y.o.   MRN: [id number]  HPI: JEF FUTCH is a 68 y.o. male presenting on 12/25/2013 for Establish Care   Pleasant 68 yo prior patient of Dr. Linda Hedges presents today to establish care.  HTN - Compliant with current antihypertensive regimen of hyzaar 100/25 daily and recently started amlodipine 5mg  daily.  Does check blood pressures at home but not recently.  No low blood pressure symptoms of dizziness/syncope.  Denies HA, CP/tightness, SOB, leg swelling. May need eyes rechecked.  Tolerating amlodipine daily.  DM - regularly does check sugars tid.  Mainly too low sugars.  Has been decreasing levemir to 35 units and still has some low sugars.  Has not seen DSME.  Compliant with antihyperglycemic regimen which includes: levemir 55u daily as well as novolog 10-15u with meals.  Endorses low sugars/hypoglycemic symptoms mainly with exertion.  Denies paresthesias. Last diabetic eye exam no recent eye exam.  Pneumovax: 06/2011.  Prevnar: 05/2013.  Has had DSME ~2011. Lab Results  Component Value Date   HGBA1C 7.6* 10/20/2013     HLD - compliant with simvastatin 20mg  daily without myalgias.  Ex smoker - quit 2013.  Wt Readings from Last 3 Encounters:  12/25/13 208 lb 1.9 oz (94.403 kg)  11/17/13 209 lb 6.4 oz (94.983 kg)  07/28/13 210 lb (95.255 kg)   Body mass index is 28.63 kg/(m^2).  Preventative: Welcome to medicare visit - 05/2013  Relevant past medical, surgical, family and social history reviewed and updated as indicated.  Allergies and medications reviewed and updated. Current Outpatient Prescriptions on File Prior to Visit  Medication Sig  . amLODipine (NORVASC) 5 MG tablet Take 1 tablet (5 mg total) by mouth daily.  [name] Kitchen losartan-hydrochlorothiazide (HYZAAR) 100-25 MG per tablet TAKE  1 TABLET BY MOUTH ONCE A DAY  . NOVOLOG FLEXPEN 100 UNIT/ML SOPN FlexPen INJECT 11 TO 15 UNITS INTO THE SKIN 3 TIMES A DAY BEFORE MEALS  . pantoprazole (PROTONIX) 40 MG tablet TAKE 1 TABLET BY MOUTH ONCE A DAY  . phenazopyridine (PYRIDIUM) 200 MG tablet Take 1 tablet (200 mg total) by mouth 3 (three) times daily as needed for pain.  . simvastatin (ZOCOR) 20 MG tablet TAKE 1 TABLET BY MOUTH AT BEDTIME  . tadalafil (CIALIS) 20 MG tablet Take 0.5-1 tablets (10-20 mg total) by mouth every other day as needed for erectile dysfunction.   No current facility-administered medications on file prior to visit.    Review of Systems Per HPI unless specifically indicated above    Objective:    BP 122/76  Pulse 96  Temp(Src) 97.8 F (36.6 C) (Oral)  Wt 208 lb 1.9 oz (94.403 kg)  Physical Exam  Nursing note and vitals reviewed. Constitutional: He appears well-developed and well-nourished. No distress.  HENT:  Head: Normocephalic and atraumatic.  Right Ear: External ear normal.  Left Ear: External ear normal.  Nose: Nose normal.  Mouth/Throat: Oropharynx is clear and moist. No oropharyngeal exudate.  Eyes: Conjunctivae and EOM are normal. Pupils are equal, round, and reactive to light. No scleral icterus.  Neck: Normal range of motion. Neck supple.  Cardiovascular: Normal rate, regular rhythm, normal heart sounds and intact distal pulses.   No murmur heard. Pulmonary/Chest: Effort normal and breath sounds normal.  No respiratory distress. He has no wheezes. He has no rales.  Musculoskeletal: He exhibits no edema.  Diabetic foot exam: Normal inspection No skin breakdown No calluses  Diminished DP/PT pulses on right Normal sensation to light touch and monofilament Nails normal  Lymphadenopathy:    He has no cervical adenopathy.  Skin: Skin is warm and dry. No rash noted.  Psychiatric: He has a normal mood and affect.   Results for orders placed in visit on 10/20/13  HEMOGLOBIN A1C       Result Value Ref Range   Hemoglobin A1C 7.6 (*) 4.6 - 6.5 %      Assessment & Plan:   Problem List Items Addressed This Visit   DIABETES MELLITUS, TYPE II - Primary     Chronic, stable. However concern for low sugars endorsed mainly with exertion.   Will decrease levemir to 45u daily. rec schedule eye appt. Foot exam today. rtc 2-3 mo for f/u    Relevant Medications      Insulin Detemir (LEVEMIR FLEXTOUCH) 100 UNIT/ML Pen   Other Relevant Orders      HM DIABETES FOOT EXAM (Completed)      Lower Extremity Arterial Doppler Bilateral   HYPERLIPIDEMIA     Tolerating statin well. Continue.    HYPERTENSION     Chronic, stable. continue meds.    Diminished pulses in lower extremity     Noted on exam today - update ABI.  Latest ABI 1.1 bilaterally (2010).    Relevant Orders      Lower Extremity Arterial Doppler Bilateral       Follow up plan: Return in about 3 months (around 03/26/2014), or if symptoms worsen or fail to improve, for diabetes follow up.

## 2013-12-25 NOTE — Progress Notes (Signed)
Pre visit review using our clinic review tool, if applicable. No additional management support is needed unless otherwise documented below in the visit note. 

## 2013-12-25 NOTE — Assessment & Plan Note (Signed)
Chronic, stable. continue meds. 

## 2013-12-25 NOTE — Assessment & Plan Note (Signed)
Noted on exam today - update ABI.  Latest ABI 1.1 bilaterally (2010).

## 2013-12-25 NOTE — Assessment & Plan Note (Signed)
Chronic, stable. However concern for low sugars endorsed mainly with exertion.   Will decrease levemir to 45u daily. rec schedule eye appt. Foot exam today. rtc 2-3 mo for f/u

## 2013-12-25 NOTE — Patient Instructions (Addendum)
Schedule eye doctor appointment as you're due. Foot exam today. Continue novolog 10-15 units with meals depending on amount of carbs in each meal. Let's decrease levemir to 45 units nightly. Let's set you up with ABI of legs for decreased pulses on right.  We will call you to set this up in the next few weeks. Good to see you today, call us with questions. No other changes today.

## 2013-12-26 HISTORY — PX: OTHER SURGICAL HISTORY: SHX169

## 2014-01-05 ENCOUNTER — Telehealth: Payer: Self-pay

## 2014-01-05 NOTE — Telephone Encounter (Signed)
Relevant patient education assigned to patient using Emmi. ° °

## 2014-01-12 ENCOUNTER — Encounter (INDEPENDENT_AMBULATORY_CARE_PROVIDER_SITE_OTHER): Payer: No Typology Code available for payment source

## 2014-01-12 DIAGNOSIS — I739 Peripheral vascular disease, unspecified: Secondary | ICD-10-CM

## 2014-01-12 DIAGNOSIS — E119 Type 2 diabetes mellitus without complications: Secondary | ICD-10-CM

## 2014-01-12 DIAGNOSIS — R0989 Other specified symptoms and signs involving the circulatory and respiratory systems: Secondary | ICD-10-CM

## 2014-01-13 ENCOUNTER — Encounter: Payer: Self-pay | Admitting: Family Medicine

## 2014-02-02 ENCOUNTER — Other Ambulatory Visit (INDEPENDENT_AMBULATORY_CARE_PROVIDER_SITE_OTHER): Payer: No Typology Code available for payment source

## 2014-02-02 ENCOUNTER — Other Ambulatory Visit: Payer: Self-pay | Admitting: Family Medicine

## 2014-02-02 DIAGNOSIS — I10 Essential (primary) hypertension: Secondary | ICD-10-CM

## 2014-02-02 DIAGNOSIS — E119 Type 2 diabetes mellitus without complications: Secondary | ICD-10-CM

## 2014-02-02 LAB — BASIC METABOLIC PANEL
BUN: 21 mg/dL (ref 6–23)
CHLORIDE: 105 meq/L (ref 96–112)
CO2: 27 mEq/L (ref 19–32)
Calcium: 9.2 mg/dL (ref 8.4–10.5)
Creatinine, Ser: 0.8 mg/dL (ref 0.4–1.5)
GFR: 107.55 mL/min (ref >60.00)
GLUCOSE: 92 mg/dL (ref 70–99)
POTASSIUM: 3.1 meq/L — AB (ref 3.5–5.1)
Sodium: 140 mEq/L (ref 135–145)

## 2014-02-02 LAB — HEMOGLOBIN A1C: Hgb A1c MFr Bld: 7.9 % — ABNORMAL HIGH (ref 4.6–6.5)

## 2014-02-02 MED ORDER — POTASSIUM CHLORIDE CRYS ER 10 MEQ PO TBCR: 10.0000 meq | EXTENDED_RELEASE_TABLET | Freq: Two times a day (BID) | ORAL | Status: DC

## 2014-02-26 ENCOUNTER — Other Ambulatory Visit: Payer: Self-pay | Admitting: *Deleted

## 2014-02-26 ENCOUNTER — Other Ambulatory Visit: Payer: Self-pay

## 2014-02-26 MED ORDER — SIMVASTATIN 20 MG PO TABS: ORAL_TABLET | ORAL | Status: DC

## 2014-02-26 MED ORDER — PANTOPRAZOLE SODIUM 40 MG PO TBEC: DELAYED_RELEASE_TABLET | ORAL | Status: DC

## 2014-02-26 MED ORDER — INSULIN DETEMIR 100 UNIT/ML FLEXPEN: 45.0000 [IU] | PEN_INJECTOR | Freq: Every day | SUBCUTANEOUS | Status: DC

## 2014-02-26 NOTE — Addendum Note (Signed)
Addended by: Modena Nunnery on: 02/26/2014 01:24 PM   Modules accepted: Orders

## 2014-03-02 ENCOUNTER — Other Ambulatory Visit: Payer: Self-pay | Admitting: *Deleted

## 2014-03-02 MED ORDER — INSULIN ASPART 100 UNIT/ML FLEXPEN: PEN_INJECTOR | SUBCUTANEOUS | Status: DC

## 2014-03-02 NOTE — Telephone Encounter (Signed)
Opened in error

## 2014-03-02 NOTE — Telephone Encounter (Signed)
Received faxed refill request from pharmacy. Refill sent to pharmacy electronically. 

## 2014-03-10 ENCOUNTER — Encounter: Payer: Self-pay | Admitting: Gastroenterology

## 2014-03-26 ENCOUNTER — Encounter: Payer: Self-pay | Admitting: Family Medicine

## 2014-03-26 ENCOUNTER — Ambulatory Visit (INDEPENDENT_AMBULATORY_CARE_PROVIDER_SITE_OTHER): Payer: No Typology Code available for payment source | Admitting: Family Medicine

## 2014-03-26 VITALS — BP 128/78 | HR 72 | Temp 98.1°F | Wt 208.0 lb

## 2014-03-26 DIAGNOSIS — I10 Essential (primary) hypertension: Secondary | ICD-10-CM

## 2014-03-26 DIAGNOSIS — E1165 Type 2 diabetes mellitus with hyperglycemia: Secondary | ICD-10-CM

## 2014-03-26 DIAGNOSIS — E785 Hyperlipidemia, unspecified: Secondary | ICD-10-CM

## 2014-03-26 DIAGNOSIS — IMO0002 Reserved for concepts with insufficient information to code with codable children: Secondary | ICD-10-CM

## 2014-03-26 DIAGNOSIS — IMO0001 Reserved for inherently not codable concepts without codable children: Secondary | ICD-10-CM

## 2014-03-26 MED ORDER — GLUCOSE BLOOD VI STRP: ORAL_STRIP | Status: DC

## 2014-03-26 NOTE — Assessment & Plan Note (Signed)
Chronic, stable. Continue meds. 

## 2014-03-26 NOTE — Patient Instructions (Addendum)
Schedule eye exam as you're due. Start checking sugars either before a meal or 2 hours after a meal, and drop of log of sugars in 2 weeks to review and we will titrate insulin accordingly. Good to see you today, call us with questions. Return for initial medicare wellness visit at next visit in 3-4 months.

## 2014-03-26 NOTE — Progress Notes (Signed)
Pre visit review using our clinic review tool, if applicable. No additional management support is needed unless otherwise documented below in the visit note. 

## 2014-03-26 NOTE — Progress Notes (Signed)
BP 128/78  Pulse 72  Temp(Src) 98.1 F (36.7 C) (Oral)  Wt 208 lb (94.348 kg)   CC:  3 Mo f/u   Subjective:    Patient ID: Anthony [name], male    DOB: March 25, 1946, 68 y.o.   MRN: [id number]  HPI: Anthony [name] is a 68 y.o. male presenting on 03/26/2014 for Follow-up   Recent ABI WNL.  DM - regularly does check sugars fasting - 140-160 and with meals <200. Checks up to 5 times a day.  Compliant with antihyperglycemic regimen which includes: levemir 45 u nightly, novolog 11-15u tid ac.  Denies low sugars or hypoglycemic symptoms.  Denies paresthesias. Last diabetic eye exam DUE.  Pneumovax: 06/2011.  Prevnar: 05/2013.   HTN - Compliant with current antihypertensive regimen of amlodipine 5mg  daily and losartan hctz 100/25.  Does not check blood pressures at home.  No low blood pressure readings or symptoms of dizziness/syncope.  Denies HA, vision changes, CP/tightness, SOB, leg swelling.   HLD - on zocor 20mg  nightly, no myalgias.  Relevant past medical, surgical, family and social history reviewed and updated as indicated.  Allergies and medications reviewed and updated. Current Outpatient Prescriptions on File Prior to Visit  Medication Sig  . amLODipine (NORVASC) 5 MG tablet Take 1 tablet (5 mg total) by mouth daily.  . insulin aspart (NOVOLOG FLEXPEN) 100 UNIT/ML FlexPen INJECT 11 TO 15 UNITS INTO THE SKIN 3 TIMES A DAY BEFORE MEALS  . Insulin Detemir (LEVEMIR FLEXTOUCH) 100 UNIT/ML Pen Inject 45 Units into the skin at bedtime.  [name] Kitchen losartan-hydrochlorothiazide (HYZAAR) 100-25 MG per tablet TAKE 1 TABLET BY MOUTH ONCE A DAY  . pantoprazole (PROTONIX) 40 MG tablet TAKE 1 TABLET BY MOUTH ONCE A DAY  . potassium chloride (K-DUR,KLOR-CON) 10 MEQ tablet Take 1 tablet (10 mEq total) by mouth 2 (two) times daily.  . simvastatin (ZOCOR) 20 MG tablet TAKE 1 TABLET BY MOUTH AT BEDTIME  . tadalafil (CIALIS) 20 MG tablet Take 0.5-1 tablets (10-20 mg total) by mouth every other day as  needed for erectile dysfunction.   No current facility-administered medications on file prior to visit.    Review of Systems Per HPI unless specifically indicated above    Objective:    BP 128/78  Pulse 72  Temp(Src) 98.1 F (36.7 C) (Oral)  Wt 208 lb (94.348 kg)  Physical Exam  Nursing note and vitals reviewed. Constitutional: He appears well-developed and well-nourished. No distress.  HENT:  Head: Normocephalic and atraumatic.  Right Ear: External ear normal.  Left Ear: External ear normal.  Nose: Nose normal.  Mouth/Throat: Oropharynx is clear and moist. No oropharyngeal exudate.  Eyes: Conjunctivae and EOM are normal. Pupils are equal, round, and reactive to light. No scleral icterus.  Neck: Normal range of motion. Neck supple.  Cardiovascular: Normal rate, regular rhythm, normal heart sounds and intact distal pulses.   No murmur heard. Pulmonary/Chest: Effort normal and breath sounds normal. No respiratory distress. He has no wheezes. He has no rales.  Musculoskeletal: He exhibits no edema.  Diabetic foot exam: Normal inspection No skin breakdown No calluses  Diminished DP/PT pulses on right but normal ABI recently Normal sensation to light tough and monofilament Nails normal  Lymphadenopathy:    He has no cervical adenopathy.  Skin: Skin is warm and dry. No rash noted.  Psychiatric: He has a normal mood and affect.   Results for orders placed in visit on 37/10/62  BASIC METABOLIC PANEL  Result Value Ref Range   Sodium 140  135 - 145 mEq/L   Potassium 3.1 (*) 3.5 - 5.1 mEq/L   Chloride 105  96 - 112 mEq/L   CO2 27  19 - 32 mEq/L   Glucose, Bld 92  70 - 99 mg/dL   BUN 21  6 - 23 mg/dL   Creatinine, Ser 0.8  0.4 - 1.5 mg/dL   Calcium 9.2  8.4 - 10.5 mg/dL   GFR 107.55  >60.00 mL/min  HEMOGLOBIN A1C      Result Value Ref Range   Hemoglobin A1C 7.9 (*) 4.6 - 6.5 %      Assessment & Plan:   Problem List Items Addressed This Visit   HYPERTENSION      Chronic, stable. Continue meds.    HYPERLIPIDEMIA     Chronic, stable. Continue simvastatin.    Diabetes type 2, uncontrolled - Primary     Chronically uncontrolled. Recently levemir decrease due to concerns for hypoglycemia. I have asked him to bring log of sugars in 2 wks to review and then will titrate insulin accordingly. Pt agrees.    Relevant Orders      HM DIABETES FOOT EXAM (Completed)      Follow up plan: Return in about 4 months (around 07/27/2014), or as needed, for medicare wellness.

## 2014-03-26 NOTE — Assessment & Plan Note (Addendum)
Chronically uncontrolled. Recently levemir decrease due to concerns for hypoglycemia. I have asked him to bring log of sugars in 2 wks to review and then will titrate insulin accordingly. Pt agrees.

## 2014-03-26 NOTE — Assessment & Plan Note (Signed)
Chronic, stable. Continue simvastatin.  

## 2014-04-03 ENCOUNTER — Other Ambulatory Visit: Payer: Self-pay | Admitting: Family Medicine

## 2014-05-06 ENCOUNTER — Other Ambulatory Visit: Payer: Self-pay | Admitting: Family Medicine

## 2014-06-02 ENCOUNTER — Other Ambulatory Visit: Payer: Self-pay | Admitting: Family Medicine

## 2014-06-08 ENCOUNTER — Other Ambulatory Visit: Payer: Self-pay | Admitting: Family Medicine

## 2014-06-23 ENCOUNTER — Other Ambulatory Visit: Payer: Self-pay | Admitting: Family Medicine

## 2014-06-23 ENCOUNTER — Other Ambulatory Visit: Payer: No Typology Code available for payment source

## 2014-06-23 DIAGNOSIS — E1165 Type 2 diabetes mellitus with hyperglycemia: Secondary | ICD-10-CM

## 2014-06-23 DIAGNOSIS — Z125 Encounter for screening for malignant neoplasm of prostate: Secondary | ICD-10-CM

## 2014-06-23 DIAGNOSIS — IMO0002 Reserved for concepts with insufficient information to code with codable children: Secondary | ICD-10-CM

## 2014-06-23 DIAGNOSIS — N138 Other obstructive and reflux uropathy: Secondary | ICD-10-CM

## 2014-06-23 DIAGNOSIS — R7989 Other specified abnormal findings of blood chemistry: Secondary | ICD-10-CM

## 2014-06-23 DIAGNOSIS — N401 Enlarged prostate with lower urinary tract symptoms: Secondary | ICD-10-CM

## 2014-06-23 DIAGNOSIS — I10 Essential (primary) hypertension: Secondary | ICD-10-CM

## 2014-06-23 DIAGNOSIS — E785 Hyperlipidemia, unspecified: Secondary | ICD-10-CM

## 2014-06-26 ENCOUNTER — Other Ambulatory Visit (INDEPENDENT_AMBULATORY_CARE_PROVIDER_SITE_OTHER): Payer: No Typology Code available for payment source

## 2014-06-26 DIAGNOSIS — E1165 Type 2 diabetes mellitus with hyperglycemia: Secondary | ICD-10-CM

## 2014-06-26 DIAGNOSIS — Z125 Encounter for screening for malignant neoplasm of prostate: Secondary | ICD-10-CM

## 2014-06-26 DIAGNOSIS — I10 Essential (primary) hypertension: Secondary | ICD-10-CM

## 2014-06-26 DIAGNOSIS — E785 Hyperlipidemia, unspecified: Secondary | ICD-10-CM

## 2014-06-26 DIAGNOSIS — N401 Enlarged prostate with lower urinary tract symptoms: Secondary | ICD-10-CM

## 2014-06-26 DIAGNOSIS — Z0000 Encounter for general adult medical examination without abnormal findings: Secondary | ICD-10-CM

## 2014-06-26 DIAGNOSIS — N138 Other obstructive and reflux uropathy: Secondary | ICD-10-CM

## 2014-06-26 DIAGNOSIS — R7989 Other specified abnormal findings of blood chemistry: Secondary | ICD-10-CM

## 2014-06-26 DIAGNOSIS — IMO0002 Reserved for concepts with insufficient information to code with codable children: Secondary | ICD-10-CM

## 2014-06-26 LAB — BASIC METABOLIC PANEL
BUN: 16 mg/dL (ref 6–23)
CALCIUM: 9.1 mg/dL (ref 8.4–10.5)
CO2: 28 mEq/L (ref 19–32)
CREATININE: 0.9 mg/dL (ref 0.4–1.5)
Chloride: 105 mEq/L (ref 96–112)
GFR: 94.55 mL/min (ref >60.00)
Glucose, Bld: 85 mg/dL (ref 70–99)
Potassium: 3.6 mEq/L (ref 3.5–5.1)
Sodium: 140 mEq/L (ref 135–145)

## 2014-06-26 LAB — LIPID PANEL
CHOL/HDL RATIO: 4
Cholesterol: 134 mg/dL (ref 0–200)
HDL: 37.2 mg/dL — AB (ref >39.00)
LDL CALC: 79 mg/dL (ref 0–99)
NONHDL: 96.8
TRIGLYCERIDES: 87 mg/dL (ref 0.0–149.0)
VLDL: 17.4 mg/dL (ref 0.0–40.0)

## 2014-06-26 LAB — MICROALBUMIN / CREATININE URINE RATIO
Creatinine,U: 104.7 mg/dL
MICROALB UR: 4.4 mg/dL — AB (ref 0.0–1.9)
Microalb Creat Ratio: 4.2 mg/g (ref 0.0–30.0)

## 2014-06-26 LAB — TSH: TSH: 3.11 u[IU]/mL (ref 0.35–4.50)

## 2014-06-26 LAB — T4, FREE: FREE T4: 0.88 ng/dL (ref 0.60–1.60)

## 2014-06-26 LAB — HEMOGLOBIN A1C: HEMOGLOBIN A1C: 7.2 % — AB (ref 4.6–6.5)

## 2014-06-26 LAB — PSA, MEDICARE: PSA: 1.93 ng/ml (ref 0.10–4.00)

## 2014-06-30 ENCOUNTER — Encounter: Payer: Self-pay | Admitting: Family Medicine

## 2014-06-30 ENCOUNTER — Ambulatory Visit (INDEPENDENT_AMBULATORY_CARE_PROVIDER_SITE_OTHER): Payer: Medicare Other | Admitting: Family Medicine

## 2014-06-30 VITALS — BP 130/84 | HR 76 | Temp 97.2°F | Ht 71.25 in | Wt 207.8 lb

## 2014-06-30 DIAGNOSIS — E785 Hyperlipidemia, unspecified: Secondary | ICD-10-CM

## 2014-06-30 DIAGNOSIS — Z7189 Other specified counseling: Secondary | ICD-10-CM | POA: Insufficient documentation

## 2014-06-30 DIAGNOSIS — I10 Essential (primary) hypertension: Secondary | ICD-10-CM

## 2014-06-30 DIAGNOSIS — IMO0002 Reserved for concepts with insufficient information to code with codable children: Secondary | ICD-10-CM

## 2014-06-30 DIAGNOSIS — Z0000 Encounter for general adult medical examination without abnormal findings: Secondary | ICD-10-CM

## 2014-06-30 DIAGNOSIS — E1165 Type 2 diabetes mellitus with hyperglycemia: Secondary | ICD-10-CM

## 2014-06-30 DIAGNOSIS — N401 Enlarged prostate with lower urinary tract symptoms: Secondary | ICD-10-CM

## 2014-06-30 DIAGNOSIS — N138 Other obstructive and reflux uropathy: Secondary | ICD-10-CM

## 2014-06-30 MED ORDER — SILDENAFIL CITRATE 100 MG PO TABS
50.0000 mg | ORAL_TABLET | Freq: Every day | ORAL | Status: DC | PRN
Start: 2014-06-30 — End: 2017-01-05

## 2014-06-30 NOTE — Assessment & Plan Note (Addendum)
Chronic, improved control.  Given some hypoglycemia will continue to decrease levemir - to 42 u qhs. Recheck in 3 months.

## 2014-06-30 NOTE — Assessment & Plan Note (Signed)
Chronic, stable. Continue regimen. 

## 2014-06-30 NOTE — Assessment & Plan Note (Signed)
S/p TURP 

## 2014-06-30 NOTE — Addendum Note (Signed)
Addended by: [name] on: 06/30/2014 01:16 PM   Modules accepted: Level of Service, SmartSet

## 2014-06-30 NOTE — Assessment & Plan Note (Signed)
Advanced planning - thinks has living will at home but unsure. Would want wife to be HCPOA.

## 2014-06-30 NOTE — Assessment & Plan Note (Signed)

## 2014-06-30 NOTE — Patient Instructions (Addendum)
Call your insurance about the shingles shot to see if it is covered or how much it would cost and where is cheaper (here or pharmacy).  If you want to receive here, call for nurse visit. Follow up with Dr. [name] for endoscopy and colonoscopy as you're due. Schedule eye exam. Return in 3 months for diabetes follow up, sooner if needed.

## 2014-06-30 NOTE — Progress Notes (Signed)
Pre visit review using our clinic review tool, if applicable. No additional management support is needed unless otherwise documented below in the visit note. 

## 2014-06-30 NOTE — Progress Notes (Addendum)
BP 130/84 mmHg  Pulse 76  Temp(Src) 97.2 F (36.2 C) (Oral)  Ht 5' 11.25" (1.81 m)  Wt 207 lb 12 oz (94.235 kg)  BMI 28.76 kg/m2   CC: medicare wellness visit , initial Subjective:    Patient ID: Anthony [name], male    DOB: 1946-04-24, 68 y.o.   MRN: [id number]  HPI: Anthony [name] is a 68 y.o. male presenting on 06/30/2014 for Annual Exam   DM - brings log of sugars, with some hypoglycemic episodes pre-prandial. Compliant with current regimen.  Passes hearing and vision today. Denies falls, depression, anhedonia  Preventative: Colonoscopy 01/2009 mult polyps (TA) and diverticulosis, ext hem, rec rpt 5 yrs [name]) Prostate cancer screening - s/p TURP. Released from [facility]. Normal voiding. Flu shot - at [facility] pneumovax 06/2011 due 2017 and prevnar 05/2013 Tetanus - 05/2013 zostavax - requests today Advanced planning - thinks has living will at home but unsure. Would want wife to be HCPOA.   Daily Caffeine Use:  2 Owner/operater of dental lab-sold, but continues to work. Fully retired. Married 1969 2 daughters 1972, 1976; 1 son 22 7 grandchildren Edu: 10th Grade Hobby: car restoration: has '66 Vette, two classic Chevelle SS's and is restoring a '37 chevy coup Regular Exercise -  NO Diet: good water, fruits/vegetables daily  Relevant past medical, surgical, family and social history reviewed and updated as indicated.  Allergies and medications reviewed and updated. Current Outpatient Prescriptions on File Prior to Visit  Medication Sig  . amLODipine (NORVASC) 5 MG tablet Take 1 tablet (5 mg total) by mouth daily.  [name] Kitchen glucose blood (ONE TOUCH ULTRA TEST) test strip Check up to 5 times daily as needed 250.02  . insulin aspart (NOVOLOG FLEXPEN) 100 UNIT/ML FlexPen INJECT 11 TO 15 UNITS INTO THE SKIN 3 TIMES A DAY BEFORE MEALS  . LEVEMIR FLEXTOUCH 100 UNIT/ML Pen INJECT 45 UNITS INTO THE SKIN AT BEDTIME. (Patient taking differently: INJECT 42 UNITS INTO THE SKIN  AT BEDTIME.)  . losartan-hydrochlorothiazide (HYZAAR) 100-25 MG per tablet TAKE 1 TABLET BY MOUTH ONCE A DAY  . pantoprazole (PROTONIX) 40 MG tablet TAKE 1 TABLET BY MOUTH ONCE A DAY  . potassium chloride (K-DUR,KLOR-CON) 10 MEQ tablet Take 1 tablet (10 mEq total) by mouth 2 (two) times daily.  . simvastatin (ZOCOR) 20 MG tablet TAKE 1 TABLET BY MOUTH AT BEDTIME  . tadalafil (CIALIS) 20 MG tablet Take 0.5-1 tablets (10-20 mg total) by mouth every other day as needed for erectile dysfunction.   No current facility-administered medications on file prior to visit.    Review of Systems Per HPI unless specifically indicated above    Objective:    BP 130/84 mmHg  Pulse 76  Temp(Src) 97.2 F (36.2 C) (Oral)  Ht 5' 11.25" (1.81 m)  Wt 207 lb 12 oz (94.235 kg)  BMI 28.76 kg/m2  Physical Exam  Constitutional: He is oriented to person, place, and time. He appears well-developed and well-nourished. No distress.  HENT:  Head: Normocephalic and atraumatic.  Right Ear: Hearing, tympanic membrane, external ear and ear canal normal.  Left Ear: Hearing, tympanic membrane, external ear and ear canal normal.  Nose: Nose normal.  Mouth/Throat: Uvula is midline, oropharynx is clear and moist and mucous membranes are normal. No oropharyngeal exudate, posterior oropharyngeal edema or posterior oropharyngeal erythema.  Eyes: Conjunctivae and EOM are normal. Pupils are equal, round, and reactive to light. No scleral icterus.  Neck: Normal range of motion. Neck supple. Carotid bruit  is not present. No thyromegaly present.  Cardiovascular: Normal rate, regular rhythm, normal heart sounds and intact distal pulses.   No murmur heard. Pulses:      Radial pulses are 2+ on the right side, and 2+ on the left side.  Pulmonary/Chest: Effort normal and breath sounds normal. No respiratory distress. He has no wheezes. He has no rales.  Abdominal: Soft. Bowel sounds are normal. He exhibits no distension and no mass.  There is no tenderness. There is no rebound and no guarding.  Musculoskeletal: Normal range of motion. He exhibits no edema.  Lymphadenopathy:    He has no cervical adenopathy.  Neurological: He is alert and oriented to person, place, and time.  CN grossly intact, station and gait intact Recall 3/3  Calculation 4/5 serial 3s  Skin: Skin is warm and dry. No rash noted.  Psychiatric: He has a normal mood and affect. His behavior is normal. Judgment and thought content normal.  Nursing note and vitals reviewed.  Results for orders placed or performed in visit on 06/26/14  Lipid panel  Result Value Ref Range   Cholesterol 134 0 - 200 mg/dL   Triglycerides 87.0 0.0 - 149.0 mg/dL   HDL 37.20 (L) >39.00 mg/dL   VLDL 17.4 0.0 - 40.0 mg/dL   LDL Cholesterol 79 0 - 99 mg/dL   Total CHOL/HDL Ratio 4    NonHDL 02.63   Basic metabolic panel  Result Value Ref Range   Sodium 140 135 - 145 mEq/L   Potassium 3.6 3.5 - 5.1 mEq/L   Chloride 105 96 - 112 mEq/L   CO2 28 19 - 32 mEq/L   Glucose, Bld 85 70 - 99 mg/dL   BUN 16 6 - 23 mg/dL   Creatinine, Ser 0.9 0.4 - 1.5 mg/dL   Calcium 9.1 8.4 - 10.5 mg/dL   GFR 94.55 >60.00 mL/min  PSA, Medicare  Result Value Ref Range   PSA 1.93 0.10 - 4.00 ng/ml  Hemoglobin A1c  Result Value Ref Range   Hgb A1c MFr Bld 7.2 (H) 4.6 - 6.5 %  TSH  Result Value Ref Range   TSH 3.11 0.35 - 4.50 uIU/mL  T4, free  Result Value Ref Range   Free T4 0.88 0.60 - 1.60 ng/dL  Microalbumin / creatinine urine ratio  Result Value Ref Range   Microalb, Ur 4.4 (H) 0.0 - 1.9 mg/dL   Creatinine,U 104.7 mg/dL   Microalb Creat Ratio 4.2 0.0 - 30.0 mg/g      Assessment & Plan:   Problem List Items Addressed This Visit    Medicare annual wellness visit, initial - Primary    I have personally reviewed the Medicare Annual Wellness questionnaire and have noted 1. The patient's medical and social history 2. Their use of alcohol, tobacco or illicit drugs 3. Their current  medications and supplements 4. The patient's functional ability including ADL's, fall risks, home safety risks and hearing or visual impairment. 5. Diet and physical activity 6. Evidence for depression or mood disorders The patients weight, height, BMI have been recorded in the chart.  Hearing and vision has been addressed. I have made referrals, counseling and provided education to the patient based review of the above and I have provided the pt with a written personalized care plan for preventive services. Provider list updated - see scanned questionairre.  Reviewed preventative protocols and updated unless pt declined.    HLD (hyperlipidemia)    Chronic ,stable. Continue simvastatin.    Relevant  Medications      sildenafil (VIAGRA) tablet   Essential hypertension    Chronic, stable. Continue regimen.    Relevant Medications      sildenafil (VIAGRA) tablet   Diabetes type 2, uncontrolled    Chronic, improved control.  Given some hypoglycemia will continue to decrease levemir - to 42 u qhs. Recheck in 3 months.    BPH (benign prostatic hypertrophy) with urinary obstruction    S/p TURP    Advanced care planning/counseling discussion    Advanced planning - thinks has living will at home but unsure. Would want wife to be HCPOA.         Follow up plan: Return in about 3 months (around 09/30/2014), or as needed, for follow up visit.

## 2014-06-30 NOTE — Assessment & Plan Note (Signed)
Chronic, stable. Continue simvastatin.  

## 2014-07-06 ENCOUNTER — Telehealth: Payer: Self-pay

## 2014-07-06 MED ORDER — ZOSTER VACCINE LIVE 19400 UNT/0.65ML ~~LOC~~ SOLR: 0.6500 mL | Freq: Once | SUBCUTANEOUS | Status: DC

## 2014-07-06 NOTE — Telephone Encounter (Signed)
plz notify med sent to pharmacy.

## 2014-07-06 NOTE — Telephone Encounter (Signed)
Mrs Markwell left v/m requesting rx for shingles vaccine sent to [facility]. Butch Penny request cb when med sent to pharmacy.

## 2014-07-07 MED ORDER — POTASSIUM CHLORIDE CRYS ER 10 MEQ PO TBCR: 10.0000 meq | EXTENDED_RELEASE_TABLET | Freq: Two times a day (BID) | ORAL | Status: DC

## 2014-07-07 NOTE — Telephone Encounter (Signed)
Patients wife notified

## 2014-07-29 ENCOUNTER — Telehealth: Payer: Self-pay

## 2014-07-29 ENCOUNTER — Other Ambulatory Visit: Payer: Self-pay | Admitting: Family Medicine

## 2014-07-29 NOTE — Telephone Encounter (Signed)
Spoke to pt regarding eye exam for this year.  Pt stated that he is wanting to schedule one for next week with [facility].

## 2014-08-03 LAB — HM DIABETES EYE EXAM

## 2014-08-15 ENCOUNTER — Encounter: Payer: Self-pay | Admitting: Family Medicine

## 2014-08-19 ENCOUNTER — Other Ambulatory Visit: Payer: Self-pay | Admitting: Family Medicine

## 2014-08-23 ENCOUNTER — Other Ambulatory Visit: Payer: Self-pay | Admitting: Family Medicine

## 2014-09-20 ENCOUNTER — Other Ambulatory Visit: Payer: Self-pay | Admitting: Family Medicine

## 2014-09-30 ENCOUNTER — Other Ambulatory Visit: Payer: Self-pay | Admitting: Family Medicine

## 2014-09-30 ENCOUNTER — Ambulatory Visit (INDEPENDENT_AMBULATORY_CARE_PROVIDER_SITE_OTHER): Payer: Medicare Other | Admitting: Family Medicine

## 2014-09-30 ENCOUNTER — Encounter: Payer: Self-pay | Admitting: Family Medicine

## 2014-09-30 VITALS — BP 140/84 | HR 84 | Temp 97.9°F | Wt 208.8 lb

## 2014-09-30 DIAGNOSIS — E113299 Type 2 diabetes mellitus with mild nonproliferative diabetic retinopathy without macular edema, unspecified eye: Secondary | ICD-10-CM

## 2014-09-30 DIAGNOSIS — I10 Essential (primary) hypertension: Secondary | ICD-10-CM

## 2014-09-30 DIAGNOSIS — E11329 Type 2 diabetes mellitus with mild nonproliferative diabetic retinopathy without macular edema: Secondary | ICD-10-CM

## 2014-09-30 DIAGNOSIS — E785 Hyperlipidemia, unspecified: Secondary | ICD-10-CM

## 2014-09-30 LAB — BASIC METABOLIC PANEL
BUN: 18 mg/dL (ref 6–23)
CALCIUM: 9.1 mg/dL (ref 8.4–10.5)
CHLORIDE: 107 meq/L (ref 96–112)
CO2: 30 meq/L (ref 19–32)
Creatinine, Ser: 0.74 mg/dL (ref 0.40–1.50)
GFR: 112.37 mL/min (ref >60.00)
Glucose, Bld: 146 mg/dL — ABNORMAL HIGH (ref 70–99)
Potassium: 3.6 mEq/L (ref 3.5–5.1)
SODIUM: 140 meq/L (ref 135–145)

## 2014-09-30 LAB — HEMOGLOBIN A1C: Hgb A1c MFr Bld: 8.3 % — ABNORMAL HIGH (ref 4.6–6.5)

## 2014-09-30 NOTE — Assessment & Plan Note (Signed)
Chronic, stable. Continue amlodipine + hyzaar.

## 2014-09-30 NOTE — Patient Instructions (Addendum)
Blood work today. We will call you with results and plan. Return as needed or in 6 months for follow up visit Bring log of sugars to next visit. Good to see you today, call us with questions.

## 2014-09-30 NOTE — Progress Notes (Signed)
Pre visit review using our clinic review tool, if applicable. No additional management support is needed unless otherwise documented below in the visit note. 

## 2014-09-30 NOTE — Progress Notes (Signed)
BP 140/84 mmHg  Pulse 84  Temp(Src) 97.9 F (36.6 C) (Oral)  Wt 208 lb 12 oz (94.688 kg)   CC: 3 mo f/u visit  Subjective:    Patient ID: Anthony [name], male    DOB: 1946/05/26, 69 y.o.   MRN: [id number]  HPI: Anthony [name] is a 69 y.o. male presenting on 09/30/2014 for Follow-up   Signed up for silver sneaker at [facility].  Head congestion today. Chronic issue with sinus drainage.   HTN - Compliant with current antihypertensive regimen of amlodipine 5mg  daily, hyzaar 100/25mg  daily.  Does not check blood pressures at home. No low blood pressure readings or symptoms of dizziness/syncope. Denies HA, vision changes, CP/tightness, SOB, leg swelling.    HLD - compliant with simvastatin without myalgias.  DM - regularly does check sugars, 116 this morning. Am sugars well controlled, pm sugars trend up.  Compliant with antihyperglycemic regimen which includes: levemir 42u nightly, novolog 11-15u TID AC.  Denies low sugars or hypoglycemic symptoms. Some lows during summer when he's more active. Denies paresthesias. Last diabetic eye exam 07/2014 with neuropathy.  Pneumovax: 06/2011, next due 06/2016.  Prevnar: 05/2013. Lab Results  Component Value Date   HGBA1C 7.2* 06/26/2014   Diabetic Foot Exam - Simple   Simple Foot Form  Diabetic Foot exam was performed with the following findings:  Yes 09/30/2014 10:44 AM  Visual Inspection  No deformities, no ulcerations, no other skin breakdown bilaterally:  Yes  Sensation Testing  Intact to touch and monofilament testing bilaterally:  Yes  Pulse Check  Posterior Tibialis and Dorsalis pulse intact bilaterally:  Yes  Comments      Relevant past medical, surgical, family and social history reviewed and updated as indicated. Interim medical history since our last visit reviewed. Allergies and medications reviewed and updated. Current Outpatient Prescriptions on File Prior to Visit  Medication Sig  . amLODipine (NORVASC) 5 MG tablet Take 1  tablet (5 mg total) by mouth daily.  [name] Kitchen glucose blood (ONE TOUCH ULTRA TEST) test strip Check up to 5 times daily as needed 250.02  . insulin aspart (NOVOLOG) 100 UNIT/ML FlexPen INJECT 11 TO 15 UNITS INTO THE SKIN 3 TIMES A DAY BEFORE MEALS  . Insulin Detemir (LEVEMIR FLEXTOUCH) 100 UNIT/ML Pen Inject 42 Units into the skin at bedtime.  [name] Kitchen losartan-hydrochlorothiazide (HYZAAR) 100-25 MG per tablet TAKE 1 TABLET BY MOUTH ONCE A DAY  . pantoprazole (PROTONIX) 40 MG tablet TAKE 1 TABLET BY MOUTH ONCE A DAY  . potassium chloride (K-DUR,KLOR-CON) 10 MEQ tablet Take 1 tablet (10 mEq total) by mouth 2 (two) times daily.  . simvastatin (ZOCOR) 20 MG tablet TAKE 1 TABLET BY MOUTH AT BEDTIME  . sildenafil (VIAGRA) 100 MG tablet Take 0.5-1 tablets (50-100 mg total) by mouth daily as needed for erectile dysfunction. (Patient not taking: Reported on 09/30/2014)   No current facility-administered medications on file prior to visit.    Review of Systems Per HPI unless specifically indicated above     Objective:    BP 140/84 mmHg  Pulse 84  Temp(Src) 97.9 F (36.6 C) (Oral)  Wt 208 lb 12 oz (94.688 kg)  Wt Readings from Last 3 Encounters:  09/30/14 208 lb 12 oz (94.688 kg)  06/30/14 207 lb 12 oz (94.235 kg)  03/26/14 208 lb (94.348 kg)    Physical Exam  Constitutional: He appears well-developed and well-nourished. No distress.  HENT:  Head: Normocephalic and atraumatic.  Right Ear: External ear normal.  Left Ear: External ear normal.  Nose: Nose normal.  Mouth/Throat: Oropharynx is clear and moist. No oropharyngeal exudate.  Eyes: Conjunctivae and EOM are normal. Pupils are equal, round, and reactive to light. No scleral icterus.  Neck: Normal range of motion. Neck supple.  Cardiovascular: Normal rate, regular rhythm, normal heart sounds and intact distal pulses.   No murmur heard. Pulmonary/Chest: Effort normal and breath sounds normal. No respiratory distress. He has no wheezes. He has no  rales.  Musculoskeletal: He exhibits no edema.  See HPI for foot exam if done  Lymphadenopathy:    He has no cervical adenopathy.  Skin: Skin is warm and dry. No rash noted.  Psychiatric: He has a normal mood and affect.  Nursing note and vitals reviewed.  Results for orders placed or performed in visit on 08/15/14  HM DIABETES EYE EXAM  Result Value Ref Range   HM Diabetic Eye Exam Retinopathy (A) No Retinopathy      Assessment & Plan:   Problem List Items Addressed This Visit    HLD (hyperlipidemia)    Chronic, stable. Continue simvastatin.      Essential hypertension    Chronic, stable. Continue amlodipine + hyzaar.      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 Check A1c today. Foot exam today. UTD eye exam and pneumococcal vaccines. Discussed strategies to avoid hypoglycemia when more active esp with upcoming plan to attend [facility].          Follow up plan: Return in about 6 months (around 03/31/2015), or as needed, for follow up visit.

## 2014-09-30 NOTE — Assessment & Plan Note (Signed)
Chronic, stable. Check A1c today. Foot exam today. UTD eye exam and pneumococcal vaccines. Discussed strategies to avoid hypoglycemia when more active esp with upcoming plan to attend [facility].

## 2014-09-30 NOTE — Assessment & Plan Note (Signed)
Chronic, stable. Continue simvastatin.  

## 2014-09-30 NOTE — Addendum Note (Signed)
Addended by: [name] on: 09/30/2014 11:12 AM   Modules accepted: Orders

## 2014-10-30 ENCOUNTER — Other Ambulatory Visit: Payer: Self-pay | Admitting: *Deleted

## 2014-10-30 DIAGNOSIS — I10 Essential (primary) hypertension: Secondary | ICD-10-CM

## 2014-10-30 MED ORDER — AMLODIPINE BESYLATE 5 MG PO TABS: 5.0000 mg | ORAL_TABLET | Freq: Every day | ORAL | Status: DC

## 2014-11-17 ENCOUNTER — Other Ambulatory Visit: Payer: Self-pay | Admitting: Family Medicine

## 2014-12-10 ENCOUNTER — Encounter: Payer: Self-pay | Admitting: Gastroenterology

## 2015-03-09 ENCOUNTER — Other Ambulatory Visit: Payer: Self-pay | Admitting: Family Medicine

## 2015-03-21 ENCOUNTER — Other Ambulatory Visit: Payer: Self-pay | Admitting: Family Medicine

## 2015-03-26 ENCOUNTER — Other Ambulatory Visit: Payer: Self-pay | Admitting: Family Medicine

## 2015-03-29 NOTE — Telephone Encounter (Signed)
Received refill request from pharmacy for 42 units, which does not match medication list Changed to 44 units, please confirm that this is current dose for patient

## 2015-03-30 NOTE — Telephone Encounter (Signed)
[facility] left v/m requesting cb about levemir flextouch.

## 2015-03-30 NOTE — Telephone Encounter (Signed)
Spoke with patient. He said that at his last visit, you told him to drop it to 42 units but that he could use 44 depending on sugar levels.

## 2015-03-30 NOTE — Telephone Encounter (Signed)
Refilled. plz notify correct dose is 42u daily.

## 2015-03-31 ENCOUNTER — Other Ambulatory Visit: Payer: Self-pay | Admitting: Family Medicine

## 2015-04-01 ENCOUNTER — Ambulatory Visit: Payer: Medicare Other | Admitting: Family Medicine

## 2015-04-13 ENCOUNTER — Ambulatory Visit (INDEPENDENT_AMBULATORY_CARE_PROVIDER_SITE_OTHER): Payer: Medicare Other | Admitting: Family Medicine

## 2015-04-13 ENCOUNTER — Encounter: Payer: Self-pay | Admitting: Family Medicine

## 2015-04-13 ENCOUNTER — Other Ambulatory Visit: Payer: Self-pay | Admitting: Family Medicine

## 2015-04-13 VITALS — BP 124/74 | HR 75 | Temp 98.2°F | Wt 206.5 lb

## 2015-04-13 DIAGNOSIS — E11329 Type 2 diabetes mellitus with mild nonproliferative diabetic retinopathy without macular edema: Secondary | ICD-10-CM | POA: Diagnosis not present

## 2015-04-13 DIAGNOSIS — I10 Essential (primary) hypertension: Secondary | ICD-10-CM

## 2015-04-13 DIAGNOSIS — E113299 Type 2 diabetes mellitus with mild nonproliferative diabetic retinopathy without macular edema, unspecified eye: Secondary | ICD-10-CM

## 2015-04-13 LAB — HEMOGLOBIN A1C: Hgb A1c MFr Bld: 7.9 % — ABNORMAL HIGH (ref 4.6–6.5)

## 2015-04-13 NOTE — Progress Notes (Signed)
BP 124/74 mmHg  Pulse 75  Temp(Src) 98.2 F (36.8 C) (Oral)  Wt 206 lb 8 oz (93.668 kg)   CC: 6 mo DM f/u visit  Subjective:    Patient ID: Anthony [name], male    DOB: 1945-12-26, 69 y.o.   MRN: [id number]  HPI: Anthony [name] is a 69 y.o. male presenting on 04/13/2015 for Follow-up   DM - may be interested in continuous glucose monitoring. Regularly does check sugars TID. Brings log - fasting 100s (some lows 60s and more highs), pre lunch 100-200s, pre dinner 90-200s. Compliant with antihyperglycemic regimen which includes: levemir 42u nightly, novolog 11-15u TID AC. Occasional low sugars or hypoglycemic symptoms - treats with OJ or glucose tablets. Denies paresthesias. Last diabetic eye exam 07/2014 with retinopathy.  Pneumovax: 2012, next due 06/2016. Prevnar: 2014. Asks about diabetic shoes. Lab Results  Component Value Date   HGBA1C 8.3* 09/30/2014   Diabetic Foot Exam - Simple   Simple Foot Form  Diabetic Foot exam was performed with the following findings:  Yes 04/13/2015  1:28 PM  Visual Inspection  No deformities, no ulcerations, no other skin breakdown bilaterally:  Yes  Sensation Testing  Intact to touch and monofilament testing bilaterally:  Yes  Pulse Check  Posterior Tibialis and Dorsalis pulse intact bilaterally:  Yes  Comments  Mild early callus R medial foot       HTN - compliant with meds (amlodipine and hyzaar). Good control at home.  HLD - compliant with simvastatin 20mg  nightly.   Worsening joint pains. Takes aleve prn.  Yard work at home. Stopped going to gym.    Relevant past medical, surgical, family and social history reviewed and updated as indicated. Interim medical history since our last visit reviewed. Allergies and medications reviewed and updated. Current Outpatient Prescriptions on File Prior to Visit  Medication Sig  . amLODipine (NORVASC) 5 MG tablet Take 1 tablet (5 mg total) by mouth daily.  [name] Kitchen glucose blood (ONE TOUCH ULTRA  TEST) test strip Use to check sugar up to 5 times daily as needed. Dx: E11.329  . insulin aspart (NOVOLOG) 100 UNIT/ML FlexPen INJECT 11 TO 15 UNITS INTO THE SKIN 3 TIMES A DAY BEFORE MEALS  . Insulin Detemir (LEVEMIR FLEXTOUCH) 100 UNIT/ML Pen Inject 42 Units into the skin at bedtime.  [name] Kitchen losartan-hydrochlorothiazide (HYZAAR) 100-25 MG per tablet TAKE 1 TABLET BY MOUTH ONCE A DAY  . pantoprazole (PROTONIX) 40 MG tablet TAKE 1 TABLET BY MOUTH ONCE A DAY  . potassium chloride (K-DUR,KLOR-CON) 10 MEQ tablet Take 1 tablet (10 mEq total) by mouth 2 (two) times daily.  . simvastatin (ZOCOR) 20 MG tablet TAKE 1 TABLET BY MOUTH AT BEDTIME  . sildenafil (VIAGRA) 100 MG tablet Take 0.5-1 tablets (50-100 mg total) by mouth daily as needed for erectile dysfunction. (Patient not taking: Reported on 09/30/2014)   No current facility-administered medications on file prior to visit.    Review of Systems Per HPI unless specifically indicated above     Objective:    BP 124/74 mmHg  Pulse 75  Temp(Src) 98.2 F (36.8 C) (Oral)  Wt 206 lb 8 oz (93.668 kg)  Wt Readings from Last 3 Encounters:  04/13/15 206 lb 8 oz (93.668 kg)  09/30/14 208 lb 12 oz (94.688 kg)  06/30/14 207 lb 12 oz (94.235 kg)    Physical Exam  Constitutional: He appears well-developed and well-nourished. No distress.  HENT:  Head: Normocephalic and atraumatic.  Right Ear: External ear  normal.  Left Ear: External ear normal.  Nose: Nose normal.  Mouth/Throat: Oropharynx is clear and moist. No oropharyngeal exudate.  Eyes: Conjunctivae and EOM are normal. Pupils are equal, round, and reactive to light. No scleral icterus.  Neck: Normal range of motion. Neck supple.  Cardiovascular: Normal rate, regular rhythm, normal heart sounds and intact distal pulses.   No murmur heard. Pulmonary/Chest: Effort normal and breath sounds normal. No respiratory distress. He has no wheezes. He has no rales.  Musculoskeletal: He exhibits no edema.    See HPI for foot exam if done  Lymphadenopathy:    He has no cervical adenopathy.  Skin: Skin is warm and dry. No rash noted.  Psychiatric: He has a normal mood and affect.  Nursing note and vitals reviewed.  Results for orders placed or performed in visit on 09/30/14  Hemoglobin A1c  Result Value Ref Range   Hgb A1c MFr Bld 8.3 (H) 4.6 - 6.5 %  Basic metabolic panel  Result Value Ref Range   Sodium 140 135 - 145 mEq/L   Potassium 3.6 3.5 - 5.1 mEq/L   Chloride 107 96 - 112 mEq/L   CO2 30 19 - 32 mEq/L   Glucose, Bld 146 (H) 70 - 99 mg/dL   BUN 18 6 - 23 mg/dL   Creatinine, Ser 0.74 0.40 - 1.50 mg/dL   Calcium 9.1 8.4 - 10.5 mg/dL   GFR 112.37 >60.00 mL/min      Assessment & Plan:   Problem List Items Addressed This Visit    Diabetes mellitus with mild nonproliferative diabetic retinopathy and without macular edema - Primary    Chronic, deteriorated as of last evaluation.  Reviewing log of sugars and meals - not consistent with mealtimes. Discussed importance of scheduled meals for optimal diabetes control. Pt will work on this over next few months. No changes to insulin for now - continue levemir 42u nightly, SSI novolog. I asked him to bring me copy of his sliding scale to review.      Relevant Orders   Hemoglobin A1c   Essential hypertension    Chronic, stable. Continue current regimen.          Follow up plan: Return in about 3 months (around 07/14/2015), or as needed, for medicare wellness visit.

## 2015-04-13 NOTE — Assessment & Plan Note (Signed)
Chronic, deteriorated as of last evaluation.  Reviewing log of sugars and meals - not consistent with mealtimes. Discussed importance of scheduled meals for optimal diabetes control. Pt will work on this over next few months. No changes to insulin for now - continue levemir 42u nightly, SSI novolog. I asked him to bring me copy of his sliding scale to review.

## 2015-04-13 NOTE — Assessment & Plan Note (Signed)
Chronic, stable. Continue current regimen. 

## 2015-04-13 NOTE — Patient Instructions (Addendum)
Work on mealtime schedule - diabetic control is easiest with a routine. Continue insulin Feet are looking good today. Blood work today. Good to see you today, call us with questions Return in 3-4 months for medicare wellness visit.

## 2015-04-13 NOTE — Progress Notes (Signed)
Pre visit review using our clinic review tool, if applicable. No additional management support is needed unless otherwise documented below in the visit note. 

## 2015-04-17 ENCOUNTER — Other Ambulatory Visit: Payer: Self-pay | Admitting: Family Medicine

## 2015-06-13 ENCOUNTER — Other Ambulatory Visit: Payer: Self-pay | Admitting: Family Medicine

## 2015-06-27 ENCOUNTER — Other Ambulatory Visit: Payer: Self-pay | Admitting: Family Medicine

## 2015-06-27 DIAGNOSIS — N401 Enlarged prostate with lower urinary tract symptoms: Secondary | ICD-10-CM

## 2015-06-27 DIAGNOSIS — E113299 Type 2 diabetes mellitus with mild nonproliferative diabetic retinopathy without macular edema, unspecified eye: Secondary | ICD-10-CM

## 2015-06-27 DIAGNOSIS — E785 Hyperlipidemia, unspecified: Secondary | ICD-10-CM

## 2015-06-27 DIAGNOSIS — I10 Essential (primary) hypertension: Secondary | ICD-10-CM

## 2015-06-27 DIAGNOSIS — N138 Other obstructive and reflux uropathy: Secondary | ICD-10-CM

## 2015-06-27 DIAGNOSIS — Z794 Long term (current) use of insulin: Principal | ICD-10-CM

## 2015-06-28 ENCOUNTER — Other Ambulatory Visit (INDEPENDENT_AMBULATORY_CARE_PROVIDER_SITE_OTHER): Payer: Medicare Other

## 2015-06-28 DIAGNOSIS — I10 Essential (primary) hypertension: Secondary | ICD-10-CM | POA: Diagnosis not present

## 2015-06-28 DIAGNOSIS — N401 Enlarged prostate with lower urinary tract symptoms: Secondary | ICD-10-CM | POA: Diagnosis not present

## 2015-06-28 DIAGNOSIS — Z794 Long term (current) use of insulin: Secondary | ICD-10-CM | POA: Diagnosis not present

## 2015-06-28 DIAGNOSIS — E785 Hyperlipidemia, unspecified: Secondary | ICD-10-CM

## 2015-06-28 DIAGNOSIS — E113299 Type 2 diabetes mellitus with mild nonproliferative diabetic retinopathy without macular edema, unspecified eye: Secondary | ICD-10-CM | POA: Diagnosis not present

## 2015-06-28 DIAGNOSIS — N138 Other obstructive and reflux uropathy: Secondary | ICD-10-CM

## 2015-06-28 LAB — MICROALBUMIN / CREATININE URINE RATIO
Creatinine,U: 148.8 mg/dL
MICROALB UR: 6.3 mg/dL — AB (ref 0.0–1.9)
Microalb Creat Ratio: 4.2 mg/g (ref 0.0–30.0)

## 2015-06-28 LAB — COMPREHENSIVE METABOLIC PANEL
ALT: 12 U/L (ref 0–53)
AST: 13 U/L (ref 0–37)
Albumin: 4 g/dL (ref 3.5–5.2)
Alkaline Phosphatase: 85 U/L (ref 39–117)
BILIRUBIN TOTAL: 0.9 mg/dL (ref 0.2–1.2)
BUN: 14 mg/dL (ref 6–23)
CO2: 31 meq/L (ref 19–32)
CREATININE: 0.71 mg/dL (ref 0.40–1.50)
Calcium: 9.3 mg/dL (ref 8.4–10.5)
Chloride: 104 mEq/L (ref 96–112)
GFR: 117.6 mL/min (ref >60.00)
Glucose, Bld: 101 mg/dL — ABNORMAL HIGH (ref 70–99)
Potassium: 3.5 mEq/L (ref 3.5–5.1)
SODIUM: 143 meq/L (ref 135–145)
Total Protein: 6.2 g/dL (ref 6.0–8.3)

## 2015-06-28 LAB — LIPID PANEL
CHOL/HDL RATIO: 3
Cholesterol: 129 mg/dL (ref 0–200)
HDL: 43.6 mg/dL (ref >39.00)
LDL Cholesterol: 69 mg/dL (ref 0–99)
NONHDL: 85.25
Triglycerides: 83 mg/dL (ref 0.0–149.0)
VLDL: 16.6 mg/dL (ref 0.0–40.0)

## 2015-06-28 LAB — PSA: PSA: 1.9 ng/mL (ref 0.10–4.00)

## 2015-06-29 ENCOUNTER — Other Ambulatory Visit: Payer: Self-pay | Admitting: Family Medicine

## 2015-07-05 ENCOUNTER — Encounter: Payer: Self-pay | Admitting: Family Medicine

## 2015-07-05 ENCOUNTER — Ambulatory Visit (INDEPENDENT_AMBULATORY_CARE_PROVIDER_SITE_OTHER): Payer: Medicare Other | Admitting: Family Medicine

## 2015-07-05 VITALS — BP 160/100 | HR 72 | Temp 97.5°F | Ht 71.25 in | Wt 208.8 lb

## 2015-07-05 DIAGNOSIS — N401 Enlarged prostate with lower urinary tract symptoms: Secondary | ICD-10-CM

## 2015-07-05 DIAGNOSIS — N138 Other obstructive and reflux uropathy: Secondary | ICD-10-CM

## 2015-07-05 DIAGNOSIS — Z794 Long term (current) use of insulin: Secondary | ICD-10-CM

## 2015-07-05 DIAGNOSIS — I10 Essential (primary) hypertension: Secondary | ICD-10-CM

## 2015-07-05 DIAGNOSIS — E785 Hyperlipidemia, unspecified: Secondary | ICD-10-CM

## 2015-07-05 DIAGNOSIS — E113299 Type 2 diabetes mellitus with mild nonproliferative diabetic retinopathy without macular edema, unspecified eye: Secondary | ICD-10-CM | POA: Diagnosis not present

## 2015-07-05 DIAGNOSIS — H7391 Unspecified disorder of tympanic membrane, right ear: Secondary | ICD-10-CM | POA: Diagnosis not present

## 2015-07-05 DIAGNOSIS — Z0000 Encounter for general adult medical examination without abnormal findings: Secondary | ICD-10-CM | POA: Diagnosis not present

## 2015-07-05 DIAGNOSIS — Z23 Encounter for immunization: Secondary | ICD-10-CM

## 2015-07-05 DIAGNOSIS — Z7189 Other specified counseling: Secondary | ICD-10-CM

## 2015-07-05 NOTE — Assessment & Plan Note (Signed)
Chronic, uncontrolled. Too early for A1c. Encouraged he regularly take basal insulin regardless of sugar levels. Check A1c next labwork.

## 2015-07-05 NOTE — Assessment & Plan Note (Signed)
bp elevated today, unclear etiology. No changes today - recheck next visit.

## 2015-07-05 NOTE — Assessment & Plan Note (Signed)
Pt endorses h/o multiple ear infections growing up. Denies hearing difficulty. Passed hearing screen today.

## 2015-07-05 NOTE — Assessment & Plan Note (Addendum)
PSA and sxs stable s/p TURP. DRE deferred today.

## 2015-07-05 NOTE — Assessment & Plan Note (Signed)
Chronic, stable. Continue simvastatin.  

## 2015-07-05 NOTE — Assessment & Plan Note (Signed)
Advanced planning - thinks has living will at home but unsure. Would want wife to be HCPOA. Asked to bring us a copy.  

## 2015-07-05 NOTE — Progress Notes (Signed)
Pre visit review using our clinic review tool, if applicable. No additional management support is needed unless otherwise documented below in the visit note. 

## 2015-07-05 NOTE — Progress Notes (Signed)
BP 160/100 mmHg  Pulse 72  Temp(Src) 97.5 F (36.4 C) (Oral)  Ht 5' 11.25" (1.81 m)  Wt 208 lb 12 oz (94.688 kg)  BMI 28.90 kg/m2   CC: medicare wellness visit  Subjective:    Patient ID: Anthony [name], male    DOB: 1946-04-14, 69 y.o.   MRN: [id number]  HPI: Anthony [name] is a 69 y.o. male presenting on 07/05/2015 for Annual Exam   DM - compliant with levemir 42u nightly, novolog 11-15u TID AC.  One low sugar to 54. Multiple high sugars to 300-400s.  HTN - on amlodipine and hyzaar.  Brings log of sugars and blood pressures.   Multiple ear infections growing up  Passes hearing today. Vision screen at eye doctor Denies falls, depression, anhedonia  Preventative: Colonoscopy 01/2009 mult polyps (TA) and diverticulosis, ext hem, rec rpt 5 yrs [name]). Pt will call to schedule f/u.  Prostate cancer screening - s/p TURP for BPH. Released from [facility]. Normal voiding.  Lung cancer screening - ~17PY hx over lifetime.  Flu shot - today pneumovax 06/2011 due 2017 and prevnar 05/2013 Tetanus - 05/2013 zostavax - 07/2014 Advanced planning - thinks has living will at home but unsure. Would want wife to be HCPOA. Asked to bring [place] a copy.  Seat belt use discussed Sunscreen use discussed. No changing moles on skin.  Daily Caffeine Use: 2 Owner/operater of dental lab-sold, but continues to work. Fully retired. Married 1969 2 daughters 1972, 1976; 1 son 14 7 grandchildren Edu: 10th Grade Hobby: car restoration: has '66 Vette, two classic Chevelle SS's and is restoring a '37 chevy coup Regular Exercise - NO Diet: good water, fruits/vegetables daily  Relevant past medical, surgical, family and social history reviewed and updated as indicated. Interim medical history since our last visit reviewed. Allergies and medications reviewed and updated. Current Outpatient Prescriptions on File Prior to Visit  Medication Sig  . amLODipine (NORVASC) 5 MG tablet Take 1 tablet  (5 mg total) by mouth daily.  [name] Kitchen glucose blood (ONE TOUCH ULTRA TEST) test strip Use to check sugar up to 5 times daily as needed. Dx: E11.329  . Insulin Detemir (LEVEMIR FLEXTOUCH) 100 UNIT/ML Pen Inject 42 Units into the skin at bedtime. (Patient taking differently: Inject 32-42 Units into the skin at bedtime. )  . KLOR-CON M10 10 MEQ tablet TAKE 1 TABLET BY MOUTH TWICE A DAY  . losartan-hydrochlorothiazide (HYZAAR) 100-25 MG per tablet TAKE 1 TABLET BY MOUTH ONCE A DAY  . NOVOLOG FLEXPEN 100 UNIT/ML FlexPen INJECT 11-15 UNITS INTO THE SKIN 3 TIMES DAILY BEFORE MEALS  . pantoprazole (PROTONIX) 40 MG tablet TAKE 1 TABLET BY MOUTH ONCE A DAY  . simvastatin (ZOCOR) 20 MG tablet TAKE 1 TABLET BY MOUTH AT BEDTIME  . sildenafil (VIAGRA) 100 MG tablet Take 0.5-1 tablets (50-100 mg total) by mouth daily as needed for erectile dysfunction. (Patient not taking: Reported on 07/05/2015)   No current facility-administered medications on file prior to visit.    Review of Systems Per HPI unless specifically indicated in ROS section     Objective:    BP 160/100 mmHg  Pulse 72  Temp(Src) 97.5 F (36.4 C) (Oral)  Ht 5' 11.25" (1.81 m)  Wt 208 lb 12 oz (94.688 kg)  BMI 28.90 kg/m2  Wt Readings from Last 3 Encounters:  07/05/15 208 lb 12 oz (94.688 kg)  04/13/15 206 lb 8 oz (93.668 kg)  09/30/14 208 lb 12 oz (94.688 kg)  Physical Exam  Constitutional: He is oriented to person, place, and time. He appears well-developed and well-nourished. No distress.  HENT:  Head: Normocephalic and atraumatic.  Right Ear: Hearing, external ear and ear canal normal.  Left Ear: Hearing, tympanic membrane, external ear and ear canal normal.  Nose: Nose normal.  Mouth/Throat: Uvula is midline, oropharynx is clear and moist and mucous membranes are normal. No oropharyngeal exudate, posterior oropharyngeal edema or posterior oropharyngeal erythema.  ?chronic R TM perf  Eyes: Conjunctivae and EOM are normal. Pupils  are equal, round, and reactive to light. No scleral icterus.  Neck: Normal range of motion. Neck supple.  Cardiovascular: Normal rate, regular rhythm, normal heart sounds and intact distal pulses.   No murmur heard. Pulses:      Radial pulses are 2+ on the right side, and 2+ on the left side.  Pulmonary/Chest: Effort normal and breath sounds normal. No respiratory distress. He has no wheezes. He has no rales.  Abdominal: Soft. Bowel sounds are normal. He exhibits no distension and no mass. There is no tenderness. There is no rebound and no guarding.  Musculoskeletal: Normal range of motion. He exhibits no edema.  Lymphadenopathy:    He has no cervical adenopathy.  Neurological: He is alert and oriented to person, place, and time.  CN grossly intact, station and gait intact  Skin: Skin is warm and dry. No rash noted.  Psychiatric: He has a normal mood and affect. His behavior is normal. Judgment and thought content normal.  Nursing note and vitals reviewed.  Results for orders placed or performed in visit on 06/28/15  Lipid panel  Result Value Ref Range   Cholesterol 129 0 - 200 mg/dL   Triglycerides 83.0 0.0 - 149.0 mg/dL   HDL 43.60 >39.00 mg/dL   VLDL 16.6 0.0 - 40.0 mg/dL   LDL Cholesterol 69 0 - 99 mg/dL   Total CHOL/HDL Ratio 3    NonHDL 85.25   Comprehensive metabolic panel  Result Value Ref Range   Sodium 143 135 - 145 mEq/L   Potassium 3.5 3.5 - 5.1 mEq/L   Chloride 104 96 - 112 mEq/L   CO2 31 19 - 32 mEq/L   Glucose, Bld 101 (H) 70 - 99 mg/dL   BUN 14 6 - 23 mg/dL   Creatinine, Ser 0.71 0.40 - 1.50 mg/dL   Total Bilirubin 0.9 0.2 - 1.2 mg/dL   Alkaline Phosphatase 85 39 - 117 U/L   AST 13 0 - 37 U/L   ALT 12 0 - 53 U/L   Total Protein 6.2 6.0 - 8.3 g/dL   Albumin 4.0 3.5 - 5.2 g/dL   Calcium 9.3 8.4 - 10.5 mg/dL   GFR 117.60 >60.00 mL/min  PSA  Result Value Ref Range   PSA 1.90 0.10 - 4.00 ng/mL  Microalbumin / creatinine urine ratio  Result Value Ref Range    Microalb, Ur 6.3 (H) 0.0 - 1.9 mg/dL   Creatinine,U 148.8 mg/dL   Microalb Creat Ratio 4.2 0.0 - 30.0 mg/g   Lab Results  Component Value Date   HGBA1C 7.9* 04/13/2015       Assessment & Plan:  Hep C screening next lab. Problem List Items Addressed This Visit    Medicare annual wellness visit, subsequent - Primary    I have personally reviewed the Medicare Annual Wellness questionnaire and have noted 1. The patient's medical and social history 2. Their use of alcohol, tobacco or illicit drugs 3. Their current medications and  supplements 4. The patient's functional ability including ADL's, fall risks, home safety risks and hearing or visual impairment. Cognitive function has been assessed and addressed as indicated.  5. Diet and physical activity 6. Evidence for depression or mood disorders The patients weight, height, BMI have been recorded in the chart. I have made referrals, counseling and provided education to the patient based on review of the above and I have provided the pt with a written personalized care plan for preventive services. Provider list updated.. See scanned questionairre as needed for further documentation. Reviewed preventative protocols and updated unless pt declined.       HLD (hyperlipidemia)    Chronic, stable. Continue simvastatin.      Essential hypertension    bp elevated today, unclear etiology. No changes today - recheck next visit.       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    Chronic, uncontrolled. Too early for A1c. Encouraged he regularly take basal insulin regardless of sugar levels. Check A1c next labwork.      BPH (benign prostatic hypertrophy) with urinary obstruction    PSA and sxs stable s/p TURP. DRE deferred today.      Advanced care planning/counseling discussion    Advanced planning - thinks has living will at home but unsure. Would want wife to be HCPOA. Asked to bring [place] a copy.         Abnormal tympanic membrane of right ear    Pt endorses h/o multiple ear infections growing up. Denies hearing difficulty. Passed hearing screen today.       Other Visit Diagnoses    Need for influenza vaccination        Relevant Orders    Flu Vaccine QUAD 36+ mos PF IM (Fluarix & Fluzone Quad PF) (Completed)        Follow up plan: Return in about 4 months (around 11/02/2015), or as needed, for follow up visit.

## 2015-07-05 NOTE — Patient Instructions (Addendum)
Flu shot today Call to schedule colonoscopy at your convenience.  Bring me copy of living will/advanced directive. Return as needed or in 3-4 months for diabetes follow up Good to see you today, call us with questions.   Health Maintenance, Male A healthy lifestyle and preventative care can promote health and wellness.  Maintain regular health, dental, and eye exams.  Eat a healthy diet. Foods like vegetables, fruits, whole grains, low-fat dairy products, and lean protein foods contain the nutrients you need and are low in calories. Decrease your intake of foods high in solid fats, added sugars, and salt. Get information about a proper diet from your health care provider, if necessary.  Regular physical exercise is one of the most important things you can do for your health. Most adults should get at least 150 minutes of moderate-intensity exercise (any activity that increases your heart rate and causes you to sweat) each week. In addition, most adults need muscle-strengthening exercises on 2 or more days a week.   Maintain a healthy weight. The body mass index (BMI) is a screening tool to identify possible weight problems. It provides an estimate of body fat based on height and weight. Your health care provider can find your BMI and can help you achieve or maintain a healthy weight. For males 20 years and older:  A BMI below 18.5 is considered underweight.  A BMI of 18.5 to 24.9 is normal.  A BMI of 25 to 29.9 is considered overweight.  A BMI of 30 and above is considered obese.  Maintain normal blood lipids and cholesterol by exercising and minimizing your intake of saturated fat. Eat a balanced diet with plenty of fruits and vegetables. Blood tests for lipids and cholesterol should begin at age 50 and be repeated every 5 years. If your lipid or cholesterol levels are high, you are over age 56, or you are at high risk for heart disease, you may need your cholesterol levels checked more  frequently.Ongoing high lipid and cholesterol levels should be treated with medicines if diet and exercise are not working.  If you smoke, find out from your health care provider how to quit. If you do not use tobacco, do not start.  Lung cancer screening is recommended for adults aged 11-80 years who are at high risk for developing lung cancer because of a history of smoking. A yearly low-dose CT scan of the lungs is recommended for people who have at least a 30-pack-year history of smoking and are current smokers or have quit within the past 15 years. A pack year of smoking is smoking an average of 1 pack of cigarettes a day for 1 year (for example, a 30-pack-year history of smoking could mean smoking 1 pack a day for 30 years or 2 packs a day for 15 years). Yearly screening should continue until the smoker has stopped smoking for at least 15 years. Yearly screening should be stopped for people who develop a health problem that would prevent them from having lung cancer treatment.  If you choose to drink alcohol, do not have more than 2 drinks per day. One drink is considered to be 12 oz (360 mL) of beer, 5 oz (150 mL) of wine, or 1.5 oz (45 mL) of liquor.  Avoid the use of street drugs. Do not share needles with anyone. Ask for help if you need support or instructions about stopping the use of drugs.  High blood pressure causes heart disease and increases the risk of  stroke. High blood pressure is more likely to develop in:  People who have blood pressure in the end of the normal range (100-139/85-89 mm Hg).  People who are overweight or obese.  People who are African American.  If you are 28-19 years of age, have your blood pressure checked every 3-5 years. If you are 37 years of age or older, have your blood pressure checked every year. You should have your blood pressure measured twice--once when you are at a hospital or clinic, and once when you are not at a hospital or clinic. Record the  average of the two measurements. To check your blood pressure when you are not at a hospital or clinic, you can use:  An automated blood pressure machine at a pharmacy.  A home blood pressure monitor.  If you are 66-53 years old, ask your health care provider if you should take aspirin to prevent heart disease.  Diabetes screening involves taking a blood sample to check your fasting blood sugar level. This should be done once every 3 years after age 44 if you are at a normal weight and without risk factors for diabetes. Testing should be considered at a younger age or be carried out more frequently if you are overweight and have at least 1 risk factor for diabetes.  Colorectal cancer can be detected and often prevented. Most routine colorectal cancer screening begins at the age of 83 and continues through age 68. However, your health care provider may recommend screening at an earlier age if you have risk factors for colon cancer. On a yearly basis, your health care provider may provide home test kits to check for hidden blood in the stool. A small camera at the end of a tube may be used to directly examine the colon (sigmoidoscopy or colonoscopy) to detect the earliest forms of colorectal cancer. Talk to your health care provider about this at age 78 when routine screening begins. A direct exam of the colon should be repeated every 5-10 years through age 75, unless early forms of precancerous polyps or small growths are found.  People who are at an increased risk for hepatitis B should be screened for this virus. You are considered at high risk for hepatitis B if:  You were born in a country where hepatitis B occurs often. Talk with your health care provider about which countries are considered high risk.  Your parents were born in a high-risk country and you have not received a shot to protect against hepatitis B (hepatitis B vaccine).  You have HIV or AIDS.  You use needles to inject street  drugs.  You live with, or have sex with, someone who has hepatitis B.  You are a man who has sex with other men (MSM).  You get hemodialysis treatment.  You take certain medicines for conditions like cancer, organ transplantation, and autoimmune conditions.  Hepatitis C blood testing is recommended for all people born from [birth year] through 1963 and any individual with known risk factors for hepatitis C.  Healthy men should no longer receive prostate-specific antigen (PSA) blood tests as part of routine cancer screening. Talk to your health care provider about prostate cancer screening.  Testicular cancer screening is not recommended for adolescents or adult males who have no symptoms. Screening includes self-exam, a health care provider exam, and other screening tests. Consult with your health care provider about any symptoms you have or any concerns you have about testicular cancer.  Practice safe sex. Use  condoms and avoid high-risk sexual practices to reduce the spread of sexually transmitted infections (STIs).  You should be screened for STIs, including gonorrhea and chlamydia if:  You are sexually active and are younger than 24 years.  You are older than 24 years, and your health care provider tells you that you are at risk for this type of infection.  Your sexual activity has changed since you were last screened, and you are at an increased risk for chlamydia or gonorrhea. Ask your health care provider if you are at risk.  If you are at risk of being infected with HIV, it is recommended that you take a prescription medicine daily to prevent HIV infection. This is called pre-exposure prophylaxis (PrEP). You are considered at risk if:  You are a man who has sex with other men (MSM).  You are a heterosexual man who is sexually active with multiple partners.  You take drugs by injection.  You are sexually active with a partner who has HIV.  Talk with your health care provider about  whether you are at high risk of being infected with HIV. If you choose to begin PrEP, you should first be tested for HIV. You should then be tested every 3 months for as long as you are taking PrEP.  Use sunscreen. Apply sunscreen liberally and repeatedly throughout the day. You should seek shade when your shadow is shorter than you. Protect yourself by wearing long sleeves, pants, a wide-brimmed hat, and sunglasses year round whenever you are outdoors.  Tell your health care provider of new moles or changes in moles, especially if there is a change in shape or color. Also, tell your health care provider if a mole is larger than the size of a pencil eraser.  A one-time screening for abdominal aortic aneurysm (AAA) and surgical repair of large AAAs by ultrasound is recommended for men aged 2-75 years who are current or former smokers.  Stay current with your vaccines (immunizations).   This information is not intended to replace advice given to you by your health care provider. Make sure you discuss any questions you have with your health care provider.   Document Released: 02/10/2008 Document Revised: 09/04/2014 Document Reviewed: 01/09/2011 Elsevier Interactive Patient Education [employer].

## 2015-07-05 NOTE — Assessment & Plan Note (Signed)

## 2015-08-09 LAB — HM DIABETES EYE EXAM

## 2015-08-10 ENCOUNTER — Other Ambulatory Visit: Payer: Self-pay | Admitting: Family Medicine

## 2015-08-30 ENCOUNTER — Other Ambulatory Visit: Payer: Self-pay | Admitting: Family Medicine

## 2015-09-07 ENCOUNTER — Encounter: Payer: Self-pay | Admitting: Family Medicine

## 2015-10-05 ENCOUNTER — Other Ambulatory Visit: Payer: Self-pay | Admitting: Family Medicine

## 2015-10-29 ENCOUNTER — Other Ambulatory Visit: Payer: Self-pay | Admitting: Family Medicine

## 2015-12-08 ENCOUNTER — Encounter: Payer: Self-pay | Admitting: Family Medicine

## 2015-12-08 DIAGNOSIS — K2270 Barrett's esophagus without dysplasia: Secondary | ICD-10-CM

## 2015-12-08 HISTORY — DX: Barrett's esophagus without dysplasia: K22.70

## 2016-01-18 ENCOUNTER — Ambulatory Visit (INDEPENDENT_AMBULATORY_CARE_PROVIDER_SITE_OTHER): Payer: Medicare Other | Admitting: Family Medicine

## 2016-01-18 ENCOUNTER — Ambulatory Visit (INDEPENDENT_AMBULATORY_CARE_PROVIDER_SITE_OTHER)
Admission: RE | Admit: 2016-01-18 | Discharge: 2016-01-18 | Disposition: A | Discharge status: Home / Self Care | Payer: Medicare Other | Source: Ambulatory Visit | Attending: Family Medicine | Admitting: Family Medicine

## 2016-01-18 ENCOUNTER — Encounter: Payer: Self-pay | Admitting: Family Medicine

## 2016-01-18 VITALS — BP 136/70 | HR 80 | Temp 97.9°F | Wt 217.0 lb

## 2016-01-18 DIAGNOSIS — J069 Acute upper respiratory infection, unspecified: Secondary | ICD-10-CM | POA: Insufficient documentation

## 2016-01-18 DIAGNOSIS — S6990XA Unspecified injury of unspecified wrist, hand and finger(s), initial encounter: Secondary | ICD-10-CM | POA: Insufficient documentation

## 2016-01-18 DIAGNOSIS — K2270 Barrett's esophagus without dysplasia: Secondary | ICD-10-CM | POA: Diagnosis not present

## 2016-01-18 DIAGNOSIS — S6992XA Unspecified injury of left wrist, hand and finger(s), initial encounter: Secondary | ICD-10-CM | POA: Diagnosis not present

## 2016-01-18 DIAGNOSIS — M25561 Pain in right knee: Secondary | ICD-10-CM | POA: Diagnosis not present

## 2016-01-18 DIAGNOSIS — S60411A Abrasion of left index finger, initial encounter: Secondary | ICD-10-CM | POA: Insufficient documentation

## 2016-01-18 DIAGNOSIS — B9789 Other viral agents as the cause of diseases classified elsewhere: Secondary | ICD-10-CM

## 2016-01-18 MED ORDER — INSULIN PEN NEEDLE 31G X 5 MM MISC: Status: DC

## 2016-01-18 MED ORDER — DICLOFENAC SODIUM 1 % TD GEL: 1.0000 "application " | Freq: Three times a day (TID) | TRANSDERMAL | Status: DC

## 2016-01-18 NOTE — Patient Instructions (Addendum)
Schedule endoscopy and colonoscopy as you're due.  Left index finger xray today - you do have finger sprain.  For R knee - I think you have arthritis flare. Treat with rest, elevation, voltaren gel sent to pharmacy, and tylenol 500mg  as needed. You do have upper respiratory infection, likely viral. Usually improve over 1 week. Watch for worsening cough or fevers >101 - if this happens let us know. Ok to take over the counter cough syrup like delsym or dimetapp.

## 2016-01-18 NOTE — Assessment & Plan Note (Addendum)
Anticipate finger sprain. Preserved ROM, predominantly limited by edema. Pt endorses worsening of pain last night - given deterioration after injury that occurred several weeks ago, will further evaluate with finger xray. Provided with finger sprain handout and exercises today. Patient will update [place] if not improving with treatment.

## 2016-01-18 NOTE — Assessment & Plan Note (Signed)
Anticipate OA flare - treat with rest, elevation of knee, and topical NSAID (avoid oral 2/2 h/o barrett's esophagus). Update if not improving with treatment.

## 2016-01-18 NOTE — Assessment & Plan Note (Signed)
Overdue for f/u EGD/colonoscopy - pt states he will call to schedule.

## 2016-01-18 NOTE — Progress Notes (Signed)
Pre visit review using our clinic review tool, if applicable. No additional management support is needed unless otherwise documented below in the visit note. 

## 2016-01-18 NOTE — Progress Notes (Signed)
BP 136/70 mmHg  Pulse 80  Temp(Src) 97.9 F (36.6 C) (Oral)  Wt 217 lb (98.431 kg)  SpO2 96% RA  CC: several issues  Subjective:    Patient ID: [name], male    DOB: [birth year], 70 y.o.   MRN: [id number]  HPI: FORBES PARZIALE is a 70 y.o. male presenting on 01/18/2016 for Knee Pain   2-3 wks ago, slammed R index finger at PIP in [place]. Continues painful, no improvement noted. Has noted unable to fully flex. Initial abrasion - now healed. Ongoing swelling.   5 d h/o R knee pain and swelling - actually better over last 1-2 days. He did fish and do yardwork last week which could have irritated knee. Worse pain with first few steps after prolonged sitting. Swelling has improved. No locking of knee or instability. + popping. Denies frank inciting injury/trauma.   Known mild osteoarthritis  Also with URI sxs over 2-3 days. Started in chest and throat, now going into head. + runny nose. Congestion and cough. No fevers/chills, ear or tooth pain. Hasn't tried anything for this yet. No sic contacts at home. No smokers at home. No know h/o asthma or COPD.   Overdue for EGD/colonoscopy.   Relevant past medical, surgical, family and social history reviewed and updated as indicated. Interim medical history since our last visit reviewed. Allergies and medications reviewed and updated. Current Outpatient Prescriptions on File Prior to Visit  Medication Sig  . amLODipine (NORVASC) 5 MG tablet TAKE 1 TABLET (5 MG TOTAL) BY MOUTH DAILY.  [name] Kitchen glucose blood (ONE TOUCH ULTRA TEST) test strip Use to check sugar up to 5 times daily as needed. Dx: E11.329  . KLOR-CON M10 10 MEQ tablet TAKE 1 TABLET BY MOUTH TWICE A DAY  . LEVEMIR FLEXTOUCH 100 UNIT/ML Pen INJECT 42 UNITS INTO THE SKIN AT BEDTIME.  [name] Kitchen losartan-hydrochlorothiazide (HYZAAR) 100-25 MG tablet TAKE 1 TABLET BY MOUTH ONCE A DAY  . NOVOLOG FLEXPEN 100 UNIT/ML FlexPen INJECT 11-15 UNITS INTO THE SKIN 3 TIMES DAILY BEFORE  MEALS  . pantoprazole (PROTONIX) 40 MG tablet TAKE 1 TABLET BY MOUTH ONCE A DAY  . simvastatin (ZOCOR) 20 MG tablet TAKE 1 TABLET BY MOUTH AT BEDTIME  . sildenafil (VIAGRA) 100 MG tablet Take 0.5-1 tablets (50-100 mg total) by mouth daily as needed for erectile dysfunction. (Patient not taking: Reported on 07/05/2015)   No current facility-administered medications on file prior to visit.    Review of Systems Per HPI unless specifically indicated in ROS section     Objective:    BP 136/70 mmHg  Pulse 80  Temp(Src) 97.9 F (36.6 C) (Oral)  Wt 217 lb (98.431 kg)  SpO2 96%  Wt Readings from Last 3 Encounters:  01/18/16 217 lb (98.431 kg)  07/05/15 208 lb 12 oz (94.688 kg)  04/13/15 206 lb 8 oz (93.668 kg)    Physical Exam  Constitutional: He appears well-developed and well-nourished. No distress.  HENT:  Head: Normocephalic and atraumatic.  Right Ear: Hearing, tympanic membrane, external ear and ear canal normal.  Left Ear: Hearing, tympanic membrane, external ear and ear canal normal.  Nose: Rhinorrhea present. No mucosal edema. Right sinus exhibits no maxillary sinus tenderness and no frontal sinus tenderness. Left sinus exhibits no maxillary sinus tenderness and no frontal sinus tenderness.  Mouth/Throat: Uvula is midline, oropharynx is clear and moist and mucous membranes are normal. No oropharyngeal exudate, posterior oropharyngeal edema, posterior oropharyngeal erythema or tonsillar abscesses.  Evidently congested, rhinorrhea present  Eyes: Conjunctivae and EOM are normal. Pupils are equal, round, and reactive to light. No scleral icterus.  Neck: Normal range of motion. Neck supple.  Cardiovascular: Normal rate, regular rhythm, normal heart sounds and intact distal pulses.   No murmur heard. Pulmonary/Chest: Effort normal. No respiratory distress. He has no wheezes. Rhonchi: faint RLL. He has no rales.  Hacking cough present  Musculoskeletal: He exhibits no edema.  Left  index finger tenderness/swelling at PIP without erythema/warmth or cut in skin. Unable to fully flex finger at PIP. Preserved isolated flexion at DIP joint. No pain at MCPs. tender to palpation 1st CMC joint  L knee WNL R Knee exam: No deformity on inspection. No significant pain with palpation of knee landmarks. No effusion/swelling noted. FROM in flex/extension with crepitus. No popliteal fullness. Neg drawer test. Neg mcmurray test. No pain with valgus/varus stress. No PFgrind. No abnormal patellar mobility.   Lymphadenopathy:    He has no cervical adenopathy.  Skin: Skin is warm and dry. No rash noted.  Nursing note and vitals reviewed.     Assessment & Plan:   Problem List Items Addressed This Visit    Barrett's esophagus    Overdue for f/u EGD/colonoscopy - pt states he will call to schedule.       Finger injury - Primary    Anticipate finger sprain. Preserved ROM, predominantly limited by edema. Pt endorses worsening of pain last night - given deterioration after injury that occurred several weeks ago, will further evaluate with finger xray. Provided with finger sprain handout and exercises today. Patient will update [place] if not improving with treatment.      Relevant Orders   DG Finger Index Left   Right knee pain    Anticipate OA flare - treat with rest, elevation of knee, and topical NSAID (avoid oral 2/2 h/o barrett's esophagus). Update if not improving with treatment.       Viral URI with cough    Anticipate viral given short duration. In ex smoker, red flags to update [place] discussed - fever >101, worsening productive cough, dyspnea, or not improving as expected over time. Pt states bad reaction to codeine in the past, so agrees to try OTC cough remedy.          Follow up plan: Return if symptoms worsen or fail to improve.  [name], MD

## 2016-01-18 NOTE — Assessment & Plan Note (Signed)
Anticipate viral given short duration. In ex smoker, red flags to update [place] discussed - fever >101, worsening productive cough, dyspnea, or not improving as expected over time. Pt states bad reaction to codeine in the past, so agrees to try OTC cough remedy.

## 2016-01-19 ENCOUNTER — Telehealth: Payer: Self-pay | Admitting: *Deleted

## 2016-01-19 MED ORDER — INSULIN PEN NEEDLE 30G X 8 MM MISC: Status: DC

## 2016-01-19 NOTE — Telephone Encounter (Signed)
Kara at [facility] left a voicemail stating that a script for 31G X  5 mm needles were sent in and patient is requesting 30G X 8 mm. Pharmacist is requesting that a new script be sent over.

## 2016-01-19 NOTE — Telephone Encounter (Signed)
Sent in

## 2016-02-21 ENCOUNTER — Encounter: Payer: Self-pay | Admitting: Family Medicine

## 2016-02-21 ENCOUNTER — Ambulatory Visit (INDEPENDENT_AMBULATORY_CARE_PROVIDER_SITE_OTHER)
Admission: RE | Admit: 2016-02-21 | Discharge: 2016-02-21 | Disposition: A | Discharge status: Home / Self Care | Payer: Medicare Other | Source: Ambulatory Visit | Attending: Family Medicine | Admitting: Family Medicine

## 2016-02-21 ENCOUNTER — Ambulatory Visit (INDEPENDENT_AMBULATORY_CARE_PROVIDER_SITE_OTHER): Payer: Medicare Other | Admitting: Family Medicine

## 2016-02-21 VITALS — BP 130/80 | HR 77 | Temp 97.8°F | Ht 71.25 in | Wt 212.2 lb

## 2016-02-21 DIAGNOSIS — M25561 Pain in right knee: Secondary | ICD-10-CM

## 2016-02-21 DIAGNOSIS — M1711 Unilateral primary osteoarthritis, right knee: Secondary | ICD-10-CM

## 2016-02-21 MED ORDER — MELOXICAM 7.5 MG PO TABS: 7.5000 mg | ORAL_TABLET | Freq: Every day | ORAL | Status: DC

## 2016-02-21 MED ORDER — METHYLPREDNISOLONE ACETATE 40 MG/ML IJ SUSP
80.0000 mg | Freq: Once | INTRAMUSCULAR | Status: AC
  Administered 2016-02-21: 80 mg via INTRA_ARTICULAR

## 2016-02-21 NOTE — Patient Instructions (Signed)
After you have an injection of any joint, the numbing medicine will make it feel better for a few hours. Later tonight, it is common for the joint to feel worse. The steroid will take 48-72 hours to start working - it is the thing that will likely provide the most relief.  Ice the joint where you had the injection at least 2-3 times a day for 20 minutes for 3 days. If have had swelling, pain in the joint itself, ice for 1 week.  You can use an ice bag, frozen peas or corn, an ice pack - all work

## 2016-02-21 NOTE — Progress Notes (Signed)
Pre visit review using our clinic review tool, if applicable. No additional management support is needed unless otherwise documented below in the visit note. 

## 2016-02-21 NOTE — Progress Notes (Signed)
Dr. Frederico Hamman T. Kavish Lafitte, MD, [facility] [place], [address] Phone: [phone] Fax: [phone]  02/21/2016  Patient: Anthony [name], MRN: [id number], DOB: 1946/08/06, 70 y.o.  Primary Physician:  [name], MD   Chief Complaint  Patient presents with  . Knee Pain    Right   Subjective:   Anthony [name] is a 70 y.o. very pleasant male patient who presents with the following:  R knee. Has been bothering and progressively worse since then. hhis knee has been bothering him for about 6 weeks or so.  He is not sure about any specific injury or trauma, but around that time he was doing a lot of yard work, and did do some fishing.  He is having some pain after getting up from a seated position and going up and down stairs.  He has had some intermittent swelling.  No mechanical symptoms and no instability.  No prior operative interventions on this knee.  Past Medical History, Surgical History, Social History, Family History, Problem List, Medications, and Allergies have been reviewed and updated if relevant.  Patient Active Problem List   Diagnosis Date Noted  . Finger injury 01/18/2016  . Right knee pain 01/18/2016  . Viral URI with cough 01/18/2016  . Barrett's esophagus 12/08/2015  . Abnormal tympanic membrane of right ear 07/05/2015  . Advanced care planning/counseling discussion 06/30/2014  . Medicare annual wellness visit, subsequent 06/22/2013  . BPH (benign prostatic hypertrophy) with urinary obstruction 06/18/2013  . ED (erectile dysfunction) 04/10/2012  . HLD (hyperlipidemia) 12/29/2008  . LEG PAIN, BILATERAL 11/19/2008  . 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 07/24/2007  . ANXIETY 07/24/2007  . Essential hypertension 07/24/2007  . GERD 07/24/2007    Past Medical History  Diagnosis Date  . GERD (gastroesophageal reflux disease)   . HTN (hypertension)     . Anxiety   . Rheumatic fever     maybe as child  . Otitis media   . Hyperlipidemia     controlled with medicine  . Depression   . H/O bronchitis     after every cold  . Arthritis     a little  . Type 2 diabetes, uncontrolled, with mild 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 macular edema ([name])     completed DMSE  . DKA (diabetic ketoacidoses) ([place]) 01/2010    "in coma"  . BPH with obstruction/lower urinary tract symptoms 2014    s/p TURP  . Barrett's esophagus 12/08/2015    By EGD 2010 [name])     Past Surgical History  Procedure Laterality Date  . Vasectomy    . Wisdom tooth extraction    . Laparoscopic appendectomy  07/05/2011    Procedure: APPENDECTOMY LAPAROSCOPIC;  Surgeon: [name], MD;  Location: [place];  Service: General;  Laterality: N/A;  . Appendectomy    . Cholecystectomy      years ago  . Transurethral resection of prostate N/A 07/28/2013    Procedure: TRANSURETHRAL RESECTION OF THE PROSTATE WITH GYRUS INSTRUMENTS;  Surgeon: [name], MD  . Burnard Bunting  12/2013    WNL  . Colonoscopy  01/2009    TA, rec rpt 5 yrs [name])  . Esophagogastroduodenoscopy  01/2009    biopsy with Barrett's    Social History   Social History  . Marital Status: Married    Spouse Name: N/A  . Number of Children: N/A  . Years of Education: N/A  Occupational History  . Not on file.   Social History Main Topics  . Smoking status: Former Smoker -- 0.30 packs/day for 50 years    Types: Cigarettes    Quit date: 08/29/2011  . Smokeless tobacco: Never Used  . Alcohol Use: 0.0 oz/week    0 Standard drinks or equivalent per week     Comment: "a couple drinks per night"  . Drug Use: No  . Sexual Activity: Not on file   Other Topics Concern  . Not on file   Social History Narrative   MD ROSTER:   GI [name]   GS - Young      Daily Caffeine Use:  2   Owner/operater of dental lab-sold, but continues to work. Fully retired.   Married 1969   2 daughters 1972, 1976;  1 son 88   7 grandchildren   Edu: 10th Grade   Hobby: car restoration: has '66 Vette, two classic Chevelle SS's and is restoring a '37 chevy coup   Regular Exercise -  NO   Diet: good water, fruits/vegetables daily    Family History  Problem Relation Age of Onset  . Lymphoma Mother     Non-hodgkins  . Coronary artery disease Mother   . Heart disease Mother   . Cancer Father   . Alcohol abuse Father   . Diabetes Brother   . Pancreatic cancer Maternal Uncle   . Colon cancer Neg Hx   . Lung cancer Neg Hx   . Diabetes Brother   . Diabetes Brother   . Diabetes Other     No Known Allergies  Medication list reviewed and updated in full in [place].  GEN: No fevers, chills. Nontoxic. Primarily MSK c/o today. MSK: Detailed in the HPI GI: tolerating PO intake without difficulty Neuro: No numbness, parasthesias, or tingling associated. Otherwise the pertinent positives of the ROS are noted above.   Objective:   BP 130/80 mmHg  Pulse 77  Temp(Src) 97.8 F (36.6 C) (Oral)  Ht 5' 11.25" (1.81 m)  Wt 212 lb 4 oz (96.276 kg)  BMI 29.39 kg/m2   GEN: WDWN, NAD, Non-toxic, Alert & Oriented x 3 HEENT: Atraumatic, Normocephalic.  Ears and Nose: No external deformity. EXTR: No clubbing/cyanosis/edema NEURO: Normal gait.  PSYCH: Normally interactive. Conversant. Not depressed or anxious appearing.  Calm demeanor.   Knee:  R Gait: Normal heel toe pattern ROM: lacks 4 deg ext, flexion to 115 Effusion: mild Echymosis or edema: none Patellar tendon NT Painful PLICA: neg Patellar grind: negative Medial and lateral patellar facet loading: negative medial and lateral joint lines: mild medial TTP Mcmurray's pain Flexion-pinch pos Varus and valgus stress: stable Lachman: neg Ant and Post drawer: neg Hip abduction, IR, ER: WNL Hip flexion str: 5/5 Hip abd: 5/5 Quad: 5/5 VMO atrophy:No Hamstring concentric and eccentric: 5/5   Radiology: Dg Knee Ap/lat W/sunrise  Right  02/21/2016  CLINICAL DATA:  Chronic right knee pain without history of trauma EXAM: RIGHT KNEE 3 VIEWS COMPARISON:  Right knee series of March 13, 2013. FINDINGS: The bones are subjectively adequately mineralized. There is beaking of the tibial spines. The medial joint space exhibits mild narrowing. There is an osteophyte arising from the medial aspect of the lateral femoral condyles. There is spurring of the femoral condylar surface of the patellofemoral joint. There is no joint effusion. There is no acute fracture nor dislocation. IMPRESSION: Moderate osteoarthritic change centered on the medial joint compartment with milder changes of the  lateral and patellofemoral joints. Electronically Signed   By: David  Martinique M.D.   On: 02/21/2016 13:50     Assessment and Plan:   Primary osteoarthritis of right knee - Plan: methylPREDNISolone acetate (DEPO-MEDROL) injection 80 mg  Right knee pain - Plan: DG Knee AP/LAT W/Sunrise Right  Significant medial compartmental osteoarthritis with exacerbation versus potential other internal derangement such as degenerative meniscal pathology.  Add meloxicam in the morning to see if this will help with some long-standing symptoms.  Knee Injection, RIGHT Patient verbally consented to procedure. Risks (including potential rare risk of infection), benefits, and alternatives explained. Sterilely prepped with Chloraprep. Ethyl cholride used for anesthesia. 8 cc Lidocaine 1% mixed with 2 mL Depo-Medrol 40 mg injected using the anteromedial approach without difficulty. No complications with procedure and tolerated well. Patient had decreased pain post-injection.   Follow-up: prn  New Prescriptions   MELOXICAM (MOBIC) 7.5 MG TABLET    Take 1 tablet (7.5 mg total) by mouth daily.   Orders Placed This Encounter  Procedures  . DG Knee AP/LAT W/Sunrise Right    Signed,  Mollie Rossano T. Fara Worthy, MD   Patient's Medications  New Prescriptions   MELOXICAM (MOBIC)  7.5 MG TABLET    Take 1 tablet (7.5 mg total) by mouth daily.  Previous Medications   AMLODIPINE (NORVASC) 5 MG TABLET    TAKE 1 TABLET (5 MG TOTAL) BY MOUTH DAILY.   B-D ULTRAFINE III SHORT PEN 31G X 8 MM MISC    USE AS DIRECTED WITH INSULIN INJECTIONS   DICLOFENAC SODIUM (VOLTAREN) 1 % GEL    Apply 1 application topically 3 (three) times daily.   GLUCOSE BLOOD (ONE TOUCH ULTRA TEST) TEST STRIP    Use to check sugar up to 5 times daily as needed. Dx: E11.329   KLOR-CON M10 10 MEQ TABLET    TAKE 1 TABLET BY MOUTH TWICE A DAY   LEVEMIR FLEXTOUCH 100 UNIT/ML PEN    INJECT 42 UNITS INTO THE SKIN AT BEDTIME.   LOSARTAN-HYDROCHLOROTHIAZIDE (HYZAAR) 100-25 MG TABLET    TAKE 1 TABLET BY MOUTH ONCE A DAY   NOVOLOG FLEXPEN 100 UNIT/ML FLEXPEN    INJECT 11-15 UNITS INTO THE SKIN 3 TIMES DAILY BEFORE MEALS   PANTOPRAZOLE (PROTONIX) 40 MG TABLET    TAKE 1 TABLET BY MOUTH ONCE A DAY   SILDENAFIL (VIAGRA) 100 MG TABLET    Take 0.5-1 tablets (50-100 mg total) by mouth daily as needed for erectile dysfunction.   SIMVASTATIN (ZOCOR) 20 MG TABLET    TAKE 1 TABLET BY MOUTH AT BEDTIME  Modified Medications   No medications on file  Discontinued Medications   INSULIN PEN NEEDLE (NOVOFINE) 30G X 8 MM MISC    Use as directed

## 2016-04-20 ENCOUNTER — Other Ambulatory Visit: Payer: Self-pay | Admitting: Family Medicine

## 2016-04-24 ENCOUNTER — Other Ambulatory Visit: Payer: Self-pay | Admitting: Family Medicine

## 2016-04-27 ENCOUNTER — Encounter: Payer: Self-pay | Admitting: Family Medicine

## 2016-04-27 ENCOUNTER — Ambulatory Visit (INDEPENDENT_AMBULATORY_CARE_PROVIDER_SITE_OTHER): Payer: Medicare Other | Admitting: Family Medicine

## 2016-04-27 VITALS — BP 152/78 | HR 73 | Temp 97.6°F | Ht 71.25 in | Wt 214.8 lb

## 2016-04-27 DIAGNOSIS — M25561 Pain in right knee: Secondary | ICD-10-CM | POA: Diagnosis not present

## 2016-04-27 MED ORDER — DIAZEPAM 5 MG PO TABS: ORAL_TABLET | ORAL | 0 refills | Status: DC

## 2016-04-27 NOTE — Progress Notes (Signed)
Pre visit review using our clinic review tool, if applicable. No additional management support is needed unless otherwise documented below in the visit note.  Valium called into [facility].

## 2016-04-27 NOTE — Progress Notes (Signed)
Dr. Frederico Hamman T. Lucyann Romano, MD, [facility] [place], [address] Phone: [phone] Fax: [phone]  04/27/2016  Patient: Anthony [name], MRN: [id number], DOB: Aug 07, 1946, 70 y.o.  Primary Physician:  [name], MD   Chief Complaint  Patient presents with  . Knee Pain    Right   Subjective:   Anthony [name] is a 70 y.o. very pleasant male patient who presents with the following:  Better for about 1 week.  Lewis - stepped down  Now he is doing worse, limping all the time.   Other history as below  02/21/2016 Last OV with Owens Loffler, MD  R knee. Has been bothering and progressively worse since then. hhis knee has been bothering him for about 6 weeks or so.  He is not sure about any specific injury or trauma, but around that time he was doing a lot of yard work, and did do some fishing.  He is having some pain after getting up from a seated position and going up and down stairs.  He has had some intermittent swelling.  No mechanical symptoms and no instability.  No prior operative interventions on this knee.  Past Medical History, Surgical History, Social History, Family History, Problem List, Medications, and Allergies have been reviewed and updated if relevant.  Patient Active Problem List   Diagnosis Date Noted  . Finger injury 01/18/2016  . Right knee pain 01/18/2016  . Viral URI with cough 01/18/2016  . Barrett's esophagus 12/08/2015  . Abnormal tympanic membrane of right ear 07/05/2015  . Advanced care planning/counseling discussion 06/30/2014  . Medicare annual wellness visit, subsequent 06/22/2013  . BPH (benign prostatic hypertrophy) with urinary obstruction 06/18/2013  . ED (erectile dysfunction) 04/10/2012  . HLD (hyperlipidemia) 12/29/2008  . LEG PAIN, BILATERAL 11/19/2008  . 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 07/24/2007  . ANXIETY  07/24/2007  . Essential hypertension 07/24/2007  . GERD 07/24/2007    Past Medical History:  Diagnosis Date  . Anxiety   . Arthritis    a little  . Barrett's esophagus 12/08/2015   By EGD 2010 [name])   . BPH with obstruction/lower urinary tract symptoms 2014   s/p TURP  . Depression   . DKA (diabetic ketoacidoses) ([place]) 01/2010   "in coma"  . GERD (gastroesophageal reflux disease)   . H/O bronchitis    after every cold  . HTN (hypertension)   . Hyperlipidemia    controlled with medicine  . Otitis media   . Rheumatic fever    maybe as child  . Type 2 diabetes, uncontrolled, with mild 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 macular edema ([name])    completed DMSE    Past Surgical History:  Procedure Laterality Date  . ABI  12/2013   WNL  . APPENDECTOMY    . CHOLECYSTECTOMY     years ago  . COLONOSCOPY  01/2009   TA, rec rpt 5 yrs [name])  . ESOPHAGOGASTRODUODENOSCOPY  01/2009   biopsy with Barrett's  . LAPAROSCOPIC APPENDECTOMY  07/05/2011   Procedure: APPENDECTOMY LAPAROSCOPIC;  Surgeon: [name], MD;  Location: [place];  Service: General;  Laterality: N/A;  . TRANSURETHRAL RESECTION OF PROSTATE N/A 07/28/2013   Procedure: TRANSURETHRAL RESECTION OF THE PROSTATE WITH GYRUS INSTRUMENTS;  Surgeon: [name], MD  . VASECTOMY    . WISDOM TOOTH EXTRACTION      Social History   Social History  . Marital status:  Married    Spouse name: N/A  . Number of children: N/A  . Years of education: N/A   Occupational History  . Not on file.   Social History Main Topics  . Smoking status: Former Smoker    Packs/day: 0.30    Years: 50.00    Types: Cigarettes    Quit date: 08/29/2011  . Smokeless tobacco: Never Used  . Alcohol use 0.0 oz/week     Comment: "a couple drinks per night"  . Drug use: No  . Sexual activity: Not on file   Other Topics Concern  . Not on file   Social History Narrative   MD ROSTER:   GI [name]   GS - Young      Daily Caffeine  Use:  2   Owner/operater of dental lab-sold, but continues to work. Fully retired.   Married 1969   2 daughters 1972, 1976; 1 son 56   7 grandchildren   Edu: 10th Grade   Hobby: car restoration: has '66 Vette, two classic Chevelle SS's and is restoring a '37 chevy coup   Regular Exercise -  NO   Diet: good water, fruits/vegetables daily    Family History  Problem Relation Age of Onset  . Lymphoma Mother     Non-hodgkins  . Coronary artery disease Mother   . Heart disease Mother   . Cancer Father   . Alcohol abuse Father   . Diabetes Brother   . Pancreatic cancer Maternal Uncle   . Colon cancer Neg Hx   . Lung cancer Neg Hx   . Diabetes Brother   . Diabetes Brother   . Diabetes Other     No Known Allergies  Medication list reviewed and updated in full in [place].  GEN: No fevers, chills. Nontoxic. Primarily MSK c/o today. MSK: Detailed in the HPI GI: tolerating PO intake without difficulty Neuro: No numbness, parasthesias, or tingling associated. Otherwise the pertinent positives of the ROS are noted above.   Objective:   BP (!) 152/78   Pulse 73   Temp 97.6 F (36.4 C) (Oral)   Ht 5' 11.25" (1.81 m)   Wt 214 lb 12 oz (97.4 kg)   BMI 29.74 kg/m    GEN: WDWN, NAD, Non-toxic, Alert & Oriented x 3 HEENT: Atraumatic, Normocephalic.  Ears and Nose: No external deformity. EXTR: No clubbing/cyanosis/edema NEURO: Normal gait.  PSYCH: Normally interactive. Conversant. Not depressed or anxious appearing.  Calm demeanor.   Knee:  R Gait: Normal heel toe pattern ROM: lacks 4 deg ext, flexion to 95 Effusion: mod Echymosis or edema: none Patellar tendon NT Painful PLICA: neg Patellar grind: negative Medial and lateral patellar facet loading: negative medial and lateral joint lines: medial TTP Mcmurray's pain Flexion-pinch pos Varus and valgus stress: stable Lachman: neg Ant and Post drawer: neg Hip abduction, IR, ER: WNL Hip flexion str: 5/5 Hip  abd: 5/5 Quad: 5/5 VMO atrophy:No Hamstring concentric and eccentric: 5/5   Radiology: Dg Knee Ap/lat W/sunrise Right  Result Date: 02/21/2016 CLINICAL DATA:  Chronic right knee pain without history of trauma EXAM: RIGHT KNEE 3 VIEWS COMPARISON:  Right knee series of March 13, 2013. FINDINGS: The bones are subjectively adequately mineralized. There is beaking of the tibial spines. The medial joint space exhibits mild narrowing. There is an osteophyte arising from the medial aspect of the lateral femoral condyles. There is spurring of the femoral condylar surface of the patellofemoral joint. There is no joint effusion. There  is no acute fracture nor dislocation. IMPRESSION: Moderate osteoarthritic change centered on the medial joint compartment with milder changes of the lateral and patellofemoral joints. Electronically Signed   By: David  Martinique M.D.   On: 02/21/2016 13:50   Assessment and Plan:   Right knee pain - Plan: MR Knee Right Wo Contrast  Worsening clinically despite > 2 months of conservative management, NSAIDS, Intraarticular injection. Obtain an MRI of the R knee to evaluate for meniscal tear or cruciate ligament disruption.  Follow-up: No Follow-up on file.  New Prescriptions   DIAZEPAM (VALIUM) 5 MG TABLET    Take one tablet by mouth 30 minutes prior to procedure   Orders Placed This Encounter  Procedures  . MR Knee Right Wo Contrast    Signed,  Ravenna Legore T. Neville Pauls, MD   Patient's Medications  New Prescriptions   DIAZEPAM (VALIUM) 5 MG TABLET    Take one tablet by mouth 30 minutes prior to procedure  Previous Medications   AMLODIPINE (NORVASC) 5 MG TABLET    TAKE 1 TABLET (5 MG TOTAL) BY MOUTH DAILY.   B-D ULTRAFINE III SHORT PEN 31G X 8 MM MISC    USE AS DIRECTED WITH INSULIN INJECTIONS   DICLOFENAC SODIUM (VOLTAREN) 1 % GEL    Apply 1 application topically 3 (three) times daily.   KLOR-CON M10 10 MEQ TABLET    TAKE 1 TABLET BY MOUTH TWICE A DAY   LEVEMIR FLEXTOUCH  100 UNIT/ML PEN    INJECT 42 UNITS INTO THE SKIN AT BEDTIME.   LOSARTAN-HYDROCHLOROTHIAZIDE (HYZAAR) 100-25 MG TABLET    TAKE 1 TABLET BY MOUTH ONCE A DAY   MELOXICAM (MOBIC) 7.5 MG TABLET    Take 1 tablet (7.5 mg total) by mouth daily.   NOVOLOG FLEXPEN 100 UNIT/ML FLEXPEN    INJECT 11-15 UNITS INTO THE SKIN 3 TIMES DAILY BEFORE MEALS   ONE TOUCH ULTRA TEST TEST STRIP    USE TO CHECK SUGAR UP TO 5 TIMES DAILY AS NEEDED. DX: E11.329   PANTOPRAZOLE (PROTONIX) 40 MG TABLET    TAKE 1 TABLET BY MOUTH ONCE A DAY   SILDENAFIL (VIAGRA) 100 MG TABLET    Take 0.5-1 tablets (50-100 mg total) by mouth daily as needed for erectile dysfunction.   SIMVASTATIN (ZOCOR) 20 MG TABLET    TAKE 1 TABLET BY MOUTH AT BEDTIME  Modified Medications   No medications on file  Discontinued Medications   No medications on file

## 2016-04-27 NOTE — Patient Instructions (Signed)

## 2016-05-11 ENCOUNTER — Ambulatory Visit
Admission: RE | Admit: 2016-05-11 | Discharge: 2016-05-11 | Disposition: A | Discharge status: Home / Self Care | Payer: Medicare Other | Source: Ambulatory Visit | Attending: Family Medicine | Admitting: Family Medicine

## 2016-05-11 ENCOUNTER — Telehealth: Payer: Self-pay | Admitting: Family Medicine

## 2016-05-11 DIAGNOSIS — M25561 Pain in right knee: Secondary | ICD-10-CM

## 2016-05-11 NOTE — Telephone Encounter (Signed)
Patient said a friend of his went to Dr.John Graves at [facility].  Patient said he wants to make sure Dr.Copland approves of Dr. Berenice Primas.  Patient scheduled an appointment with Dr.Graves on 05/16/16,but he'll cancel the appointment if Dr.Copland doesn't approve.Please call patient back to let him know if Dr.Copland approves.

## 2016-05-11 NOTE — Telephone Encounter (Signed)
Anthony [name] notified as instructed by telephone.

## 2016-05-11 NOTE — Telephone Encounter (Signed)
[name] is great - very good Psychologist, sport and exercise and nice.

## 2016-05-29 ENCOUNTER — Other Ambulatory Visit: Payer: Self-pay | Admitting: Family Medicine

## 2016-06-14 ENCOUNTER — Other Ambulatory Visit: Payer: Self-pay | Admitting: Family Medicine

## 2016-06-19 ENCOUNTER — Other Ambulatory Visit: Payer: Self-pay | Admitting: Family Medicine

## 2016-07-21 ENCOUNTER — Other Ambulatory Visit: Payer: Self-pay | Admitting: Family Medicine

## 2016-07-24 NOTE — Telephone Encounter (Signed)
[facility] Last filled - 06/19/16. #[id number] [Note: Must have Physical scheduled for Refills] Last AEX - 07/05/15 [Acutes only since last physical] Next AEX - No future appts scheduled Please Advise on refills/SLS 11/27

## 2016-07-26 ENCOUNTER — Other Ambulatory Visit: Payer: Self-pay | Admitting: Family Medicine

## 2016-08-28 HISTORY — PX: MENISCUS REPAIR: SHX5179

## 2016-09-11 DIAGNOSIS — M25561 Pain in right knee: Secondary | ICD-10-CM | POA: Diagnosis not present

## 2016-09-11 DIAGNOSIS — M1711 Unilateral primary osteoarthritis, right knee: Secondary | ICD-10-CM | POA: Diagnosis not present

## 2016-09-24 ENCOUNTER — Other Ambulatory Visit: Payer: Self-pay | Admitting: Family Medicine

## 2016-09-28 ENCOUNTER — Other Ambulatory Visit: Payer: Self-pay | Admitting: Family Medicine

## 2016-10-05 ENCOUNTER — Ambulatory Visit (INDEPENDENT_AMBULATORY_CARE_PROVIDER_SITE_OTHER): Payer: Medicare HMO | Admitting: Family Medicine

## 2016-10-05 ENCOUNTER — Encounter: Payer: Self-pay | Admitting: Family Medicine

## 2016-10-05 VITALS — BP 144/100 | HR 104 | Temp 98.3°F | Wt 216.5 lb

## 2016-10-05 DIAGNOSIS — E113299 Type 2 diabetes mellitus with mild nonproliferative diabetic retinopathy without macular edema, unspecified eye: Secondary | ICD-10-CM

## 2016-10-05 DIAGNOSIS — Z794 Long term (current) use of insulin: Secondary | ICD-10-CM

## 2016-10-05 DIAGNOSIS — I10 Essential (primary) hypertension: Secondary | ICD-10-CM | POA: Diagnosis not present

## 2016-10-05 MED ORDER — AMLODIPINE BESYLATE 5 MG PO TABS: ORAL_TABLET | ORAL | 3 refills | Status: DC

## 2016-10-05 MED ORDER — POTASSIUM CHLORIDE CRYS ER 10 MEQ PO TBCR: 10.0000 meq | EXTENDED_RELEASE_TABLET | Freq: Two times a day (BID) | ORAL | 3 refills | Status: DC

## 2016-10-05 MED ORDER — SIMVASTATIN 20 MG PO TABS: 20.0000 mg | ORAL_TABLET | Freq: Every day | ORAL | 3 refills | Status: DC

## 2016-10-05 MED ORDER — LOSARTAN POTASSIUM-HCTZ 100-25 MG PO TABS: 1.0000 | ORAL_TABLET | Freq: Every day | ORAL | 3 refills | Status: DC

## 2016-10-05 MED ORDER — INSULIN DETEMIR 100 UNIT/ML FLEXPEN: PEN_INJECTOR | SUBCUTANEOUS | 3 refills | Status: DC

## 2016-10-05 MED ORDER — INSULIN ASPART 100 UNIT/ML FLEXPEN: 15.0000 [IU] | PEN_INJECTOR | Freq: Three times a day (TID) | SUBCUTANEOUS | 3 refills | Status: DC

## 2016-10-05 MED ORDER — PANTOPRAZOLE SODIUM 40 MG PO TBEC: 40.0000 mg | DELAYED_RELEASE_TABLET | Freq: Every day | ORAL | 3 refills | Status: DC

## 2016-10-05 NOTE — Progress Notes (Signed)
Pre visit review using our clinic review tool, if applicable. No additional management support is needed unless otherwise documented below in the visit note. 

## 2016-10-05 NOTE — Assessment & Plan Note (Signed)
Chronic, uncontrolled, update A1c.  Encouraged schedule eye exam.

## 2016-10-05 NOTE — Assessment & Plan Note (Addendum)
Chronic, uncontrolled. Discussed potassium rich diet, avoiding salt/sodium. Recheck next visit.

## 2016-10-05 NOTE — Patient Instructions (Addendum)
Call and schedule eye appointment as you're due. Medicines refilled for 3 months Labs today.  Return in 3 months for physical

## 2016-10-05 NOTE — Progress Notes (Signed)
BP (!) 144/100   Pulse (!) 104   Temp 98.3 F (36.8 C) (Oral)   Wt 216 lb 8 oz (98.2 kg)   BMI 29.98 kg/m    CC: 56mo f/u DM Subjective:    Patient ID: Anthony [name], male    DOB: October 26, 1945, 71 y.o.   MRN: [id number]  HPI: Anthony [name] is a 71 y.o. male presenting on 10/05/2016 for Follow-up   DM - regularly does check sugars 3 times daily, this morning 450. Other days well controlled. Compliant with antihyperglycemic regimen which includes: levemir 46u QHS, novolog 15-18u TID AC.  Denies low sugars or hypoglycemic symptoms. Denies paresthesias. Last diabetic eye exam DUE.  Pneumovax: 2012.  Prevnar: 2014. H/o DKA.  Lab Results  Component Value Date   HGBA1C 7.9 (H) 04/13/2015   Diabetic Foot Exam - Simple   Simple Foot Form Diabetic Foot exam was performed with the following findings:  Yes 10/05/2016  5:19 PM  Visual Inspection No deformities, no ulcerations, no other skin breakdown bilaterally:  Yes Sensation Testing Intact to touch and monofilament testing bilaterally:  Yes Pulse Check Posterior Tibialis and Dorsalis pulse intact bilaterally:  Yes Comments Scaling of soles      HTN - compliant with amlodipine and hyzaar daily.   Relevant past medical, surgical, family and social history reviewed and updated as indicated. Interim medical history since our last visit reviewed. Allergies and medications reviewed and updated. Current Outpatient Prescriptions on File Prior to Visit  Medication Sig  . B-D ULTRAFINE III SHORT PEN 31G X 8 MM MISC USE AS DIRECTED WITH INSULIN INJECTIONS  . ONE TOUCH ULTRA TEST test strip USE TO CHECK SUGAR UP TO 5 TIMES DAILY AS NEEDED. DX: E11.329  . sildenafil (VIAGRA) 100 MG tablet Take 0.5-1 tablets (50-100 mg total) by mouth daily as needed for erectile dysfunction. (Patient not taking: Reported on 10/05/2016)   No current facility-administered medications on file prior to visit.     Review of Systems Per HPI unless specifically  indicated in ROS section     Objective:    BP (!) 144/100   Pulse (!) 104   Temp 98.3 F (36.8 C) (Oral)   Wt 216 lb 8 oz (98.2 kg)   BMI 29.98 kg/m   Wt Readings from Last 3 Encounters:  10/05/16 216 lb 8 oz (98.2 kg)  04/27/16 214 lb 12 oz (97.4 kg)  02/21/16 212 lb 4 oz (96.3 kg)    Physical Exam  Constitutional: He appears well-developed and well-nourished. No distress.  HENT:  Head: Normocephalic and atraumatic.  Right Ear: External ear normal.  Left Ear: External ear normal.  Nose: Nose normal.  Mouth/Throat: Oropharynx is clear and moist. No oropharyngeal exudate.  Eyes: Conjunctivae and EOM are normal. Pupils are equal, round, and reactive to light. No scleral icterus.  Neck: Normal range of motion. Neck supple.  Cardiovascular: Normal rate, regular rhythm, normal heart sounds and intact distal pulses.   No murmur heard. Pulmonary/Chest: Effort normal and breath sounds normal. No respiratory distress. He has no wheezes. He has no rales.  Musculoskeletal: He exhibits no edema.  See HPI for foot exam if done  Lymphadenopathy:    He has no cervical adenopathy.  Skin: Skin is warm and dry. No rash noted.  Psychiatric: He has a normal mood and affect.  Nursing note and vitals reviewed.  Results for orders placed or performed in visit on 09/07/15  HM DIABETES EYE EXAM  Result Value  Ref Range   HM Diabetic Eye Exam No Retinopathy No Retinopathy      Assessment & Plan:   Problem List Items Addressed This Visit    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 ([name]) - Primary    Chronic, uncontrolled, update A1c.  Encouraged schedule eye exam.       Relevant Medications   Insulin Detemir (LEVEMIR FLEXTOUCH) 100 UNIT/ML Pen   insulin aspart (NOVOLOG FLEXPEN) 100 UNIT/ML FlexPen   losartan-hydrochlorothiazide (HYZAAR) 100-25 MG tablet   simvastatin (ZOCOR) 20 MG tablet   Other Relevant Orders   Basic metabolic panel   Hemoglobin  A1c   LDL Cholesterol, Direct   Essential hypertension    Chronic, uncontrolled. Discussed potassium rich diet, avoiding salt/sodium. Recheck next visit.       Relevant Medications   amLODipine (NORVASC) 5 MG tablet   losartan-hydrochlorothiazide (HYZAAR) 100-25 MG tablet   simvastatin (ZOCOR) 20 MG tablet       Follow up plan: Return in about 3 months (around 01/02/2017) for annual exam, prior fasting for blood work, [employer] wellness visit.  [name], MD

## 2016-10-06 LAB — BASIC METABOLIC PANEL
BUN: 20 mg/dL (ref 6–23)
CO2: 29 mEq/L (ref 19–32)
CREATININE: 0.96 mg/dL (ref 0.40–1.50)
Calcium: 9.5 mg/dL (ref 8.4–10.5)
Chloride: 102 mEq/L (ref 96–112)
GFR: 82.71 mL/min (ref >60.00)
Glucose, Bld: 291 mg/dL — ABNORMAL HIGH (ref 70–99)
Potassium: 3.7 mEq/L (ref 3.5–5.1)
Sodium: 138 mEq/L (ref 135–145)

## 2016-10-06 LAB — LDL CHOLESTEROL, DIRECT: Direct LDL: 94 mg/dL

## 2016-10-06 LAB — HEMOGLOBIN A1C: Hgb A1c MFr Bld: 8.1 % — ABNORMAL HIGH (ref 4.6–6.5)

## 2016-10-08 ENCOUNTER — Other Ambulatory Visit: Payer: Self-pay | Admitting: Family Medicine

## 2016-10-19 DIAGNOSIS — R69 Illness, unspecified: Secondary | ICD-10-CM | POA: Diagnosis not present

## 2016-11-20 ENCOUNTER — Other Ambulatory Visit: Payer: Self-pay | Admitting: Family Medicine

## 2016-12-06 DIAGNOSIS — E119 Type 2 diabetes mellitus without complications: Secondary | ICD-10-CM | POA: Diagnosis not present

## 2016-12-06 DIAGNOSIS — H2513 Age-related nuclear cataract, bilateral: Secondary | ICD-10-CM | POA: Diagnosis not present

## 2016-12-06 DIAGNOSIS — H524 Presbyopia: Secondary | ICD-10-CM | POA: Diagnosis not present

## 2016-12-06 DIAGNOSIS — H5203 Hypermetropia, bilateral: Secondary | ICD-10-CM | POA: Diagnosis not present

## 2016-12-06 LAB — HM DIABETES EYE EXAM

## 2016-12-20 ENCOUNTER — Encounter: Payer: Self-pay | Admitting: *Deleted

## 2016-12-29 ENCOUNTER — Ambulatory Visit (INDEPENDENT_AMBULATORY_CARE_PROVIDER_SITE_OTHER): Payer: Medicare HMO

## 2016-12-29 VITALS — BP 132/80 | HR 74 | Temp 98.0°F | Ht 70.25 in | Wt 215.5 lb

## 2016-12-29 DIAGNOSIS — Z0000 Encounter for general adult medical examination without abnormal findings: Secondary | ICD-10-CM

## 2016-12-29 DIAGNOSIS — Z23 Encounter for immunization: Secondary | ICD-10-CM

## 2016-12-29 NOTE — Progress Notes (Signed)
Pre visit review using our clinic review tool, if applicable. No additional management support is needed unless otherwise documented below in the visit note. 

## 2016-12-29 NOTE — Patient Instructions (Signed)
[name] , Thank you for taking time to come for your Medicare Wellness Visit. I appreciate your ongoing commitment to your health goals. Please review the following plan we discussed and let me know if I can assist you in the future.   These are the goals we discussed: Goals    . knee pain          When scheduled, I will meet with ortho specialist at [place] and discuss treatment plan for painful knees.        This is a list of the screening recommended for you and due dates:  Health Maintenance  Topic Date Due  .  Hepatitis C: One time screening is recommended by Center for Disease Control  (CDC) for  adults born from [birth year] through 1963.   01/05/2017*  . DTaP/Tdap/Td vaccine (1 - Tdap) 06/19/2023*  . Flu Shot  03/28/2017  . Hemoglobin A1C  04/04/2017  . Complete foot exam   10/05/2017  . Eye exam for diabetics  12/06/2017  . Colon Cancer Screening  02/02/2019  . Tetanus Vaccine  06/19/2023  . Pneumonia vaccines  Completed  *Topic was postponed. The date shown is not the original due date.   Preventive Care for Adults  A healthy lifestyle and preventive care can promote health and wellness. Preventive health guidelines for adults include the following key practices.  . A routine yearly physical is a good way to check with your health care provider about your health and preventive screening. It is a chance to share any concerns and updates on your health and to receive a thorough exam.  . Visit your dentist for a routine exam and preventive care every 6 months. Brush your teeth twice a day and floss once a day. Good oral hygiene prevents tooth decay and gum disease.  . The frequency of eye exams is based on your age, health, family medical history, use  of contact lenses, and other factors. Follow your health care provider's ecommendations for frequency of eye exams.  . Eat a healthy diet. Foods like vegetables, fruits, whole grains, low-fat dairy products, and  lean protein foods contain the nutrients you need without too many calories. Decrease your intake of foods high in solid fats, added sugars, and salt. Eat the right amount of calories for you. Get information about a proper diet from your health care provider, if necessary.  . Regular physical exercise is one of the most important things you can do for your health. Most adults should get at least 150 minutes of moderate-intensity exercise (any activity that increases your heart rate and causes you to sweat) each week. In addition, most adults need muscle-strengthening exercises on 2 or more days a week.  Silver Sneakers may be a benefit available to you. To determine eligibility, you may visit the website: www.silversneakers.com or contact program at 6395834580 Mon-Fri between 8AM-8PM.   . Maintain a healthy weight. The body mass index (BMI) is a screening tool to identify possible weight problems. It provides an estimate of body fat based on height and weight. Your health care provider can find your BMI and can help you achieve or maintain a healthy weight.   For adults 20 years and older: ? A BMI below 18.5 is considered underweight. ? A BMI of 18.5 to 24.9 is normal. ? A BMI of 25 to 29.9 is considered overweight. ? A BMI of 30 and above is considered obese.   . Maintain normal blood lipids and cholesterol  levels by exercising and minimizing your intake of saturated fat. Eat a balanced diet with plenty of fruit and vegetables. Blood tests for lipids and cholesterol should begin at age 53 and be repeated every 5 years. If your lipid or cholesterol levels are high, you are over 50, or you are at high risk for heart disease, you may need your cholesterol levels checked more frequently. Ongoing high lipid and cholesterol levels should be treated with medicines if diet and exercise are not working.  . If you smoke, find out from your health care provider how to quit. If you do not use tobacco,  please do not start.  . If you choose to drink alcohol, please do not consume more than 2 drinks per day. One drink is considered to be 12 ounces (355 mL) of beer, 5 ounces (148 mL) of wine, or 1.5 ounces (44 mL) of liquor.  . If you are 52-44 years old, ask your health care provider if you should take aspirin to prevent strokes.  . Use sunscreen. Apply sunscreen liberally and repeatedly throughout the day. You should seek shade when your shadow is shorter than you. Protect yourself by wearing long sleeves, pants, a wide-brimmed hat, and sunglasses year round, whenever you are outdoors.  . Once a month, do a whole body skin exam, using a mirror to look at the skin on your back. Tell your health care provider of new moles, moles that have irregular borders, moles that are larger than a pencil eraser, or moles that have changed in shape or color.

## 2016-12-29 NOTE — Progress Notes (Signed)
Subjective:   Anthony [name] is a 71 y.o. male who presents for Medicare Annual/Subsequent preventive examination.  Review of Systems:  N/A Cardiac Risk Factors include: advanced age (>71men, >33 women);obesity (BMI >30kg/m2);diabetes mellitus;male gender;dyslipidemia;hypertension     Objective:    Vitals: BP 132/80 (BP Location: Right Arm, Patient Position: Sitting, Cuff Size: Normal)   Pulse 74   Temp 98 F (36.7 C) (Oral)   Ht 5' 10.25" (1.784 m) Comment: no shoes  Wt 215 lb 8 oz (97.8 kg)   SpO2 95%   BMI 30.70 kg/m   Body mass index is 30.7 kg/m.  Tobacco History  Smoking Status  . Former Smoker  . Packs/day: 0.30  . Years: 50.00  . Types: Cigarettes  . Quit date: 08/29/2011  Smokeless Tobacco  . Never Used     Counseling given: No   Past Medical History:  Diagnosis Date  . Anxiety   . Arthritis    a little  . Barrett's esophagus 12/08/2015   By EGD 2010 [name])   . BPH with obstruction/lower urinary tract symptoms 2014   s/p TURP  . Depression   . DKA (diabetic ketoacidoses) ([name]) 01/2010   "in coma"  . GERD (gastroesophageal reflux disease)   . H/O bronchitis    after every cold  . HTN (hypertension)   . Hyperlipidemia    controlled with medicine  . Otitis media   . Rheumatic fever    maybe as child  . 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 ABI  12/2013   WNL  . APPENDECTOMY    . CHOLECYSTECTOMY     years ago  . COLONOSCOPY  01/2009   TA, rec rpt 5 yrs [name])  . ESOPHAGOGASTRODUODENOSCOPY  01/2009   biopsy with Barrett's  . LAPAROSCOPIC APPENDECTOMY  07/05/2011   Procedure: APPENDECTOMY LAPAROSCOPIC;  Surgeon: [name], MD;  Location: [name];  Service: General;  Laterality: N/A;  . TRANSURETHRAL RESECTION OF PROSTATE N/A 07/28/2013   Procedure: TRANSURETHRAL RESECTION OF THE PROSTATE WITH GYRUS  INSTRUMENTS;  Surgeon: [name], MD  . VASECTOMY    . WISDOM TOOTH EXTRACTION     Family History  Problem Relation Age of Onset  . Lymphoma Mother     Non-hodgkins  . Coronary artery disease Mother   . Heart disease Mother   . Cancer Father   . Alcohol abuse Father   . Diabetes Brother   . Pancreatic cancer Maternal Uncle   . Diabetes Brother   . Diabetes Brother   . Diabetes Other   . Colon cancer Neg Hx   . Lung cancer Neg Hx    History  Sexual Activity  . Sexual activity: Not on file    Outpatient Encounter Prescriptions as of 12/29/2016  Medication Sig  . amLODipine (NORVASC) 5 MG tablet TAKE 1 TABLET (5 MG TOTAL) BY MOUTH DAILY.  [name] Kitchen B-D ULTRAFINE III SHORT PEN 31G X 8 MM MISC USE AS DIRECTED WITH INSULIN INJECTIONS  . insulin aspart (NOVOLOG FLEXPEN) 100 UNIT/ML FlexPen Inject 15-18 Units into the skin 3 (three) times daily with meals.  . Insulin Detemir (LEVEMIR FLEXTOUCH) 100 UNIT/ML Pen Inject 50 Units into the skin daily at 10 pm.  . losartan-hydrochlorothiazide (HYZAAR) 100-25 MG tablet Take 1 tablet by mouth daily.  . ONE TOUCH ULTRA TEST test strip USE TO CHECK SUGAR UP TO  5 TIMES DAILY AS NEEDED. DX: G18.299  . pantoprazole (PROTONIX) 40 MG tablet Take 1 tablet (40 mg total) by mouth daily.  . potassium chloride (KLOR-CON M10) 10 MEQ tablet Take 1 tablet (10 mEq total) by mouth 2 (two) times daily.  . sildenafil (VIAGRA) 100 MG tablet Take 0.5-1 tablets (50-100 mg total) by mouth daily as needed for erectile dysfunction.  . simvastatin (ZOCOR) 20 MG tablet Take 1 tablet (20 mg total) by mouth at bedtime.   No facility-administered encounter medications on file as of 12/29/2016.     Activities of Daily Living In your present state of health, do you have any difficulty performing the following activities: 12/29/2016  Hearing? N  Vision? N  Difficulty concentrating or making decisions? N  Walking or climbing stairs? Y  Dressing or bathing? N  Doing errands,  shopping? N  Preparing Food and eating ? N  Using the Toilet? N  In the past six months, have you accidently leaked urine? N  Do you have problems with loss of bowel control? N  Managing your Medications? N  Managing your Finances? N  Housekeeping or managing your Housekeeping? N  Some recent data might be hidden    Patient Care Team: [name], MD as PCP - General (Family Medicine) [name], MD (Neurosurgery) [name], MD as Consulting Physician (General Surgery) [name], MD as Consulting Physician (Urology)   Assessment:     Hearing Screening   125Hz  250Hz  500Hz  1000Hz  2000Hz  3000Hz  4000Hz  6000Hz  8000Hz   Right ear:   40 40 40  40    Left ear:   40 40 40  0    Vision Screening Comments: Last vision in April 2018 with Dr. [name]    Exercise Activities and Dietary recommendations Current Exercise Habits: The patient does not participate in regular exercise at present (pt does yard work as weather permits), Exercise limited by: None identified  Goals    . knee pain          When scheduled, I will meet with ortho specialist at [facility] and discuss treatment plan for painful knees.       Fall Risk Fall Risk  12/29/2016 07/05/2015 06/30/2014  Falls in the past year? No No No   Depression Screen PHQ 2/9 Scores 12/29/2016 07/05/2015 06/30/2014  PHQ - 2 Score 0 0 0    Cognitive Function MMSE - Mini Mental State Exam 12/29/2016  Orientation to time 5  Orientation to Place 5  Registration 3  Attention/ Calculation 0  Recall 3  Language- name 2 objects 0  Language- repeat 1  Language- follow 3 step command 3  Language- read & follow direction 0  Write a sentence 0  Copy design 0  Total score 20     PLEASE NOTE: A Mini-Cog screen was completed. Maximum score is 20. A value of 0 denotes this part of Folstein MMSE was not completed or the patient failed this part of the Mini-Cog screening.   Mini-Cog Screening Orientation to Time - Max 5  pts Orientation to Place - Max 5 pts Registration - Max 3 pts Recall - Max 3 pts Language Repeat - Max 1 pts Language Follow 3 Step Command - Max 3 pts     Immunization History  Administered Date(s) Administered  . Influenza Whole 06/02/2008, 06/02/2009, 06/02/2010, 07/08/2011, 07/08/2012  . Influenza,inj,Quad PF,36+ Mos 07/05/2015  . Influenza-Unspecified 05/28/2013, 05/28/2014, 06/30/2016  . Pneumococcal Conjugate-13 06/18/2013  . Pneumococcal Polysaccharide-23 07/08/2011, 12/29/2016  .  Tetanus 06/18/2013  . Zoster 08/26/2014   Screening Tests Health Maintenance  Topic Date Due  . Hepatitis C Screening  01/05/2017 (Originally [birth year])  . DTaP/Tdap/Td (1 - Tdap) 06/19/2023 (Originally 06/13/1965)  . INFLUENZA VACCINE  03/28/2017  . HEMOGLOBIN A1C  04/04/2017  . FOOT EXAM  10/05/2017  . OPHTHALMOLOGY EXAM  12/06/2017  . COLONOSCOPY  02/02/2019  . TETANUS/TDAP  06/19/2023  . PNA vac Low Risk Adult  Completed      Plan:     I have personally reviewed and addressed the Medicare Annual Wellness questionnaire and have noted the following in the patient's chart:  A. Medical and social history B. Use of alcohol, tobacco or illicit drugs  C. Current medications and supplements D. Functional ability and status E.  Nutritional status F.  Physical activity G. Advance directives H. List of other physicians I.  Hospitalizations, surgeries, and ER visits in previous 12 months J.  [place] to include hearing, vision, cognitive, depression L. Referrals and appointments - none  In addition, I have reviewed and discussed with patient certain preventive protocols, quality metrics, and best practice recommendations. A written personalized care plan for preventive services as well as general preventive health recommendations were provided to patient.  See attached scanned questionnaire for additional information.   Signed,   Lindell Noe, MHA, BS, LPN [facility]

## 2016-12-29 NOTE — Progress Notes (Signed)
PCP notes:   Health maintenance:  Hep C screening - will be completed with future labs PPSV23 - administered   Abnormal screenings:   Hearing - failed  Patient concerns:   Pt reports chronic knee pain when walking.  Nurse concerns:  None  Next PCP appt:   01/05/17 @ 1130

## 2016-12-30 NOTE — Progress Notes (Signed)
I reviewed health advisor's note, was available for consultation, and agree with documentation and plan.  

## 2017-01-05 ENCOUNTER — Ambulatory Visit (INDEPENDENT_AMBULATORY_CARE_PROVIDER_SITE_OTHER): Payer: Medicare HMO | Admitting: Family Medicine

## 2017-01-05 ENCOUNTER — Encounter: Payer: Self-pay | Admitting: Family Medicine

## 2017-01-05 VITALS — BP 148/88 | HR 71 | Temp 98.0°F | Ht 70.25 in | Wt 217.0 lb

## 2017-01-05 DIAGNOSIS — E785 Hyperlipidemia, unspecified: Secondary | ICD-10-CM

## 2017-01-05 DIAGNOSIS — Z7189 Other specified counseling: Secondary | ICD-10-CM

## 2017-01-05 DIAGNOSIS — K2270 Barrett's esophagus without dysplasia: Secondary | ICD-10-CM | POA: Diagnosis not present

## 2017-01-05 DIAGNOSIS — Z1211 Encounter for screening for malignant neoplasm of colon: Secondary | ICD-10-CM

## 2017-01-05 DIAGNOSIS — M2550 Pain in unspecified joint: Secondary | ICD-10-CM | POA: Diagnosis not present

## 2017-01-05 DIAGNOSIS — H7391 Unspecified disorder of tympanic membrane, right ear: Secondary | ICD-10-CM

## 2017-01-05 DIAGNOSIS — K219 Gastro-esophageal reflux disease without esophagitis: Secondary | ICD-10-CM

## 2017-01-05 DIAGNOSIS — Z794 Long term (current) use of insulin: Secondary | ICD-10-CM

## 2017-01-05 DIAGNOSIS — N138 Other obstructive and reflux uropathy: Secondary | ICD-10-CM | POA: Diagnosis not present

## 2017-01-05 DIAGNOSIS — Z0001 Encounter for general adult medical examination with abnormal findings: Secondary | ICD-10-CM

## 2017-01-05 DIAGNOSIS — N401 Enlarged prostate with lower urinary tract symptoms: Secondary | ICD-10-CM | POA: Diagnosis not present

## 2017-01-05 DIAGNOSIS — Z0000 Encounter for general adult medical examination without abnormal findings: Secondary | ICD-10-CM

## 2017-01-05 DIAGNOSIS — Z1159 Encounter for screening for other viral diseases: Secondary | ICD-10-CM | POA: Diagnosis not present

## 2017-01-05 DIAGNOSIS — E113299 Type 2 diabetes mellitus with mild nonproliferative diabetic retinopathy without macular edema, unspecified eye: Secondary | ICD-10-CM

## 2017-01-05 DIAGNOSIS — I10 Essential (primary) hypertension: Secondary | ICD-10-CM | POA: Diagnosis not present

## 2017-01-05 LAB — COMPREHENSIVE METABOLIC PANEL
ALK PHOS: 85 U/L (ref 39–117)
ALT: 13 U/L (ref 0–53)
AST: 12 U/L (ref 0–37)
Albumin: 4.1 g/dL (ref 3.5–5.2)
BUN: 18 mg/dL (ref 6–23)
CO2: 30 meq/L (ref 19–32)
Calcium: 9.1 mg/dL (ref 8.4–10.5)
Chloride: 103 mEq/L (ref 96–112)
Creatinine, Ser: 0.88 mg/dL (ref 0.40–1.50)
GFR: 91.38 mL/min (ref >60.00)
GLUCOSE: 212 mg/dL — AB (ref 70–99)
POTASSIUM: 3.6 meq/L (ref 3.5–5.1)
SODIUM: 139 meq/L (ref 135–145)
TOTAL PROTEIN: 6 g/dL (ref 6.0–8.3)
Total Bilirubin: 0.9 mg/dL (ref 0.2–1.2)

## 2017-01-05 LAB — CBC WITH DIFFERENTIAL/PLATELET
BASOS PCT: 0.7 % (ref 0.0–3.0)
Basophils Absolute: 0 10*3/uL (ref 0.0–0.1)
EOS PCT: 4 % (ref 0.0–5.0)
Eosinophils Absolute: 0.2 10*3/uL (ref 0.0–0.7)
HEMATOCRIT: 41.3 % (ref 39.0–52.0)
HEMOGLOBIN: 14.2 g/dL (ref 13.0–17.0)
LYMPHS PCT: 24.2 % (ref 12.0–46.0)
Lymphs Abs: 1.4 10*3/uL (ref 0.7–4.0)
MCHC: 34.4 g/dL (ref 30.0–36.0)
MCV: 88.6 fl (ref 78.0–100.0)
MONOS PCT: 8.3 % (ref 3.0–12.0)
Monocytes Absolute: 0.5 10*3/uL (ref 0.1–1.0)
NEUTROS ABS: 3.6 10*3/uL (ref 1.4–7.7)
Neutrophils Relative %: 62.8 % (ref 43.0–77.0)
PLATELETS: 209 10*3/uL (ref 150.0–400.0)
RBC: 4.66 Mil/uL (ref 4.22–5.81)
RDW: 13.3 % (ref 11.5–15.5)
WBC: 5.7 10*3/uL (ref 4.0–10.5)

## 2017-01-05 LAB — LIPID PANEL
CHOL/HDL RATIO: 3
Cholesterol: 122 mg/dL (ref 0–200)
HDL: 39.9 mg/dL (ref >39.00)
LDL Cholesterol: 62 mg/dL (ref 0–99)
NONHDL: 81.98
Triglycerides: 101 mg/dL (ref 0.0–149.0)
VLDL: 20.2 mg/dL (ref 0.0–40.0)

## 2017-01-05 LAB — HEMOGLOBIN A1C: Hgb A1c MFr Bld: 8 % — ABNORMAL HIGH (ref 4.6–6.5)

## 2017-01-05 LAB — SEDIMENTATION RATE: Sed Rate: 6 mm/hr (ref 0–20)

## 2017-01-05 LAB — PSA: PSA: 2.06 ng/mL (ref 0.10–4.00)

## 2017-01-05 MED ORDER — ZOSTER VAC RECOMB ADJUVANTED 50 MCG/0.5ML IM SUSR: 0.5000 mL | Freq: Once | INTRAMUSCULAR | 1 refills | Status: AC

## 2017-01-05 MED ORDER — SILDENAFIL CITRATE 20 MG PO TABS: 40.0000 mg | ORAL_TABLET | Freq: Every day | ORAL | 3 refills | Status: DC | PRN

## 2017-01-05 MED ORDER — ASPIRIN EC 81 MG PO TBEC: 81.0000 mg | DELAYED_RELEASE_TABLET | Freq: Every day | ORAL | Status: DC

## 2017-01-05 NOTE — Assessment & Plan Note (Signed)
Ongoing trouble with sugar control, brittle diabetes as evidenced by range of sugars despite no significant change in diet. Will refer to endo for assistance with management. Pt has questions about insulin pump and continuous glucose monitoring.

## 2017-01-05 NOTE — Assessment & Plan Note (Signed)
Anticipate chronic perforation after multiple ear infections growing up. Passed recent hearing screen.

## 2017-01-05 NOTE — Assessment & Plan Note (Signed)
Update FLP on zocor.

## 2017-01-05 NOTE — Progress Notes (Signed)
BP (!) 148/88 (BP Location: Right Arm, Patient Position: Sitting, Cuff Size: Normal)   Pulse 71   Temp 98 F (36.7 C) (Oral)   Ht 5' 10.25" (1.784 m)   Wt 217 lb (98.4 kg)   SpO2 95%   BMI 30.92 kg/m    CC: CPE Subjective:    Patient ID: Anthony [name], male    DOB: [birth year], 71 y.o.   MRN: [id number]  HPI: [name] is a 71 y.o. male presenting on 01/05/2017 for Annual Exam   Saw Katha Cabal last week for medicare wellness visit. Note reviewed.   Labs due today.  Sugars staying very brittle - brings log which was reviewed, checking TID AC. Sugars range from 60-400s. He takes levemir 50u daily and novolog 15-18 units TID with meals. Some lack of hypoglycemia awareness.  Has backed off potassium.  Ongoing hand cramping and joint pains especially at IPs, less tender at [place] decreased libido. Requests generic sildenafil.   Preventative: Colonoscopy 01/2009 mult polyps (TA) and diverticulosis, ext hem, rec rpt 5 yrs [name]). Pt will call to schedule f/u.  Prostate cancer screening - s/p TURP for BPH. Released from [facility] care. Normal voiding.  Lung cancer screening - ~17PY hx over lifetime.  Flu shot yearly  Pneumovax 06/2011, 2018. Prevnar 05/2013 Tetanus - 05/2013 zostavax - 07/2014 shingrix - discussed.  Advanced planning - thinks has living will at home but unsure. Would want wife to be HCPOA. Asked to bring [place] a copy.   Seat belt use discussed  Sunscreen use discussed. No changing moles on skin.  Ex smoker quit 2013, <20 PY hx Alcohol - a few mixed drinks/night  Daily Caffeine Use: 2 Owner/operater of dental lab-sold, but continues to work. Fully retired. Married 1969 2 daughters 1972, 1976; 1 son 67 7 grandchildren Edu: 10th Grade Hobby: car restoration: has '66 Vette, two classic Chevelle SS's and is restoring a '37 chevy coup Regular Exercise - NO Diet: good water, fruits/vegetables daily  Relevant past medical, surgical, family and social  history reviewed and updated as indicated. Interim medical history since our last visit reviewed. Allergies and medications reviewed and updated. Outpatient Medications Prior to Visit  Medication Sig Dispense Refill  . amLODipine (NORVASC) 5 MG tablet TAKE 1 TABLET (5 MG TOTAL) BY MOUTH DAILY. 30 tablet 3  . B-D ULTRAFINE III SHORT PEN 31G X 8 MM MISC USE AS DIRECTED WITH INSULIN INJECTIONS  11  . insulin aspart (NOVOLOG FLEXPEN) 100 UNIT/ML FlexPen Inject 15-18 Units into the skin 3 (three) times daily with meals. 6 pen 3  . Insulin Detemir (LEVEMIR FLEXTOUCH) 100 UNIT/ML Pen Inject 50 Units into the skin daily at 10 pm. 5 pen 3  . losartan-hydrochlorothiazide (HYZAAR) 100-25 MG tablet Take 1 tablet by mouth daily. 30 tablet 3  . ONE TOUCH ULTRA TEST test strip USE TO CHECK SUGAR UP TO 5 TIMES DAILY AS NEEDED. DX: E11.329 300 each 3  . pantoprazole (PROTONIX) 40 MG tablet Take 1 tablet (40 mg total) by mouth daily. 30 tablet 3  . potassium chloride (KLOR-CON M10) 10 MEQ tablet Take 1 tablet (10 mEq total) by mouth 2 (two) times daily. 60 tablet 3  . simvastatin (ZOCOR) 20 MG tablet Take 1 tablet (20 mg total) by mouth at bedtime. 30 tablet 3  . sildenafil (VIAGRA) 100 MG tablet Take 0.5-1 tablets (50-100 mg total) by mouth daily as needed for erectile dysfunction. 5 tablet 11   No facility-administered medications prior to  visit.      Per HPI unless specifically indicated in ROS section below Review of Systems  Constitutional: Negative for activity change, appetite change, chills, fatigue, fever and unexpected weight change.  HENT: Negative for hearing loss.   Eyes: Negative for visual disturbance.  Respiratory: Positive for shortness of breath (mild). Negative for cough (mild dry), chest tightness and wheezing.   Cardiovascular: Negative for chest pain, palpitations and leg swelling.  Gastrointestinal: Positive for abdominal pain (R inguinal region with coughing). Negative for abdominal  distention, blood in stool, constipation, diarrhea, nausea and vomiting.  Genitourinary: Negative for difficulty urinating and hematuria.  Musculoskeletal: Positive for arthralgias (hands). Negative for myalgias and neck pain.  Skin: Negative for rash.  Neurological: Negative for dizziness, seizures, syncope and headaches.  Hematological: Negative for adenopathy. Does not bruise/bleed easily.  Psychiatric/Behavioral: Negative for dysphoric mood. The patient is not nervous/anxious.        Objective:    BP (!) 148/88 (BP Location: Right Arm, Patient Position: Sitting, Cuff Size: Normal)   Pulse 71   Temp 98 F (36.7 C) (Oral)   Ht 5' 10.25" (1.784 m)   Wt 217 lb (98.4 kg)   SpO2 95%   BMI 30.92 kg/m   Wt Readings from Last 3 Encounters:  01/05/17 217 lb (98.4 kg)  12/29/16 215 lb 8 oz (97.8 kg)  10/05/16 216 lb 8 oz (98.2 kg)    Physical Exam  Constitutional: He is oriented to person, place, and time. He appears well-developed and well-nourished. No distress.  HENT:  Head: Normocephalic and atraumatic.  Right Ear: Hearing, external ear and ear canal normal.  Left Ear: Hearing, tympanic membrane, external ear and ear canal normal.  Nose: Nose normal.  Mouth/Throat: Uvula is midline, oropharynx is clear and moist and mucous membranes are normal. No oropharyngeal exudate, posterior oropharyngeal edema or posterior oropharyngeal erythema.  Chronic perforation but denies hearing loss  Eyes: Conjunctivae and EOM are normal. Pupils are equal, round, and reactive to light. No scleral icterus.  Neck: Normal range of motion. Neck supple. Carotid bruit is not present. No thyromegaly present.  Cardiovascular: Normal rate, regular rhythm, normal heart sounds and intact distal pulses.   No murmur heard. Pulses:      Radial pulses are 2+ on the right side, and 2+ on the left side.  Pulmonary/Chest: Effort normal and breath sounds normal. No respiratory distress. He has no wheezes. He has no  rales.  Abdominal: Soft. Bowel sounds are normal. He exhibits no distension and no mass. There is no tenderness. There is no rebound and no guarding.  Genitourinary: Rectum normal. Rectal exam shows no external hemorrhoid, no internal hemorrhoid, no fissure, no mass, no tenderness and anal tone normal. Prostate is enlarged (30gm). Prostate is not tender.  Musculoskeletal: Normal range of motion. He exhibits no edema.  Lymphadenopathy:    He has no cervical adenopathy.  Neurological: He is alert and oriented to person, place, and time.  CN grossly intact, station and gait intact  Skin: Skin is warm and dry. No rash noted.  Psychiatric: He has a normal mood and affect. His behavior is normal. Judgment and thought content normal.  Nursing note and vitals reviewed.  Results for orders placed or performed in visit on 12/20/16  HM DIABETES EYE EXAM  Result Value Ref Range   HM Diabetic Eye Exam No Retinopathy No Retinopathy      Assessment & Plan:   Problem List Items Addressed This Visit  Abnormal tympanic membrane of right ear    Anticipate chronic perforation after multiple ear infections growing up. Passed recent hearing screen.      Advanced care planning/counseling discussion    Advanced planning - thinks has living will at home but unsure. Would want wife to be HCPOA. Asked to bring [place] a copy.        Barrett's esophagus   Relevant Orders   CBC with Differential/Platelet   Benign prostatic hyperplasia with urinary obstruction    Chronic, stable. DRE with evidence of BPH, PSA reassuring.      Relevant Orders   PSA   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 ([name])    Ongoing trouble with sugar control, brittle diabetes as evidenced by range of sugars despite no significant change in diet. Will refer to endo for assistance with management. Pt has questions about insulin pump and continuous glucose monitoring.      Relevant Medications    aspirin EC 81 MG tablet   Other Relevant Orders   Comprehensive metabolic panel   Hemoglobin A1c   Ambulatory referral to Endocrinology   Essential hypertension    Chronic, mildly elevated today. Continue current regimen.       Relevant Medications   sildenafil (REVATIO) 20 MG tablet   aspirin EC 81 MG tablet   GERD    Chronic, stable on daily protonix. Continue.      Health maintenance examination - Primary    Preventative protocols reviewed and updated unless pt declined. Discussed healthy diet and lifestyle.       HLD (hyperlipidemia)    Update FLP on zocor.       Relevant Medications   sildenafil (REVATIO) 20 MG tablet   aspirin EC 81 MG tablet   Other Relevant Orders   Lipid panel   Comprehensive metabolic panel    Other Visit Diagnoses    Special screening for malignant neoplasms, colon       Relevant Orders   Ambulatory referral to Gastroenterology   Need for hepatitis C screening test       Relevant Orders   Hepatitis C antibody   Arthralgia, unspecified joint       Relevant Orders   Sedimentation rate       Follow up plan: Return in about 6 months (around 07/08/2017) for follow up visit.  [name], MD

## 2017-01-05 NOTE — Assessment & Plan Note (Signed)
Advanced planning - thinks has living will at home but unsure. Would want wife to be HCPOA. Asked to bring us a copy.  

## 2017-01-05 NOTE — Patient Instructions (Addendum)
Labs today. We will refer you to diabetes doctor to help with brittle sugars. See Anthony [name] on your way out.  We will refer you back to GI for colonoscopy. Check with pharmacy about getting new 2 shot series shingles shot (shingrix). Rx sent to pharmacy.  Bring [place] copy of your living will.  Return in 6 months for follow up visit.   Health Maintenance, Male A healthy lifestyle and preventive care is important for your health and wellness. Ask your health care provider about what schedule of regular examinations is right for you. What should I know about weight and diet?  Eat a Healthy Diet  Eat plenty of vegetables, fruits, whole grains, low-fat dairy products, and lean protein.  Do not eat a lot of foods high in solid fats, added sugars, or salt. Maintain a Healthy Weight  Regular exercise can help you achieve or maintain a healthy weight. You should:  Do at least 150 minutes of exercise each week. The exercise should increase your heart rate and make you sweat (moderate-intensity exercise).  Do strength-training exercises at least twice a week. Watch Your Levels of Cholesterol and Blood Lipids  Have your blood tested for lipids and cholesterol every 5 years starting at 69 years of age. If you are at high risk for heart disease, you should start having your blood tested when you are 69 years old. You may need to have your cholesterol levels checked more often if:  Your lipid or cholesterol levels are high.  You are older than 69 years of age.  You are at high risk for heart disease. What should I know about cancer screening? Many types of cancers can be detected early and may often be prevented. Lung Cancer  You should be screened every year for lung cancer if:  You are a current smoker who has smoked for at least 30 years.  You are a former smoker who has quit within the past 15 years.  Talk to your health care provider about your screening options, when you should start  screening, and how often you should be screened. Colorectal Cancer  Routine colorectal cancer screening usually begins at 69 years of age and should be repeated every 5-10 years until you are 69 years old. You may need to be screened more often if early forms of precancerous polyps or small growths are found. Your health care provider may recommend screening at an earlier age if you have risk factors for colon cancer.  Your health care provider may recommend using home test kits to check for hidden blood in the stool.  A small camera at the end of a tube can be used to examine your colon (sigmoidoscopy or colonoscopy). This checks for the earliest forms of colorectal cancer. Prostate and Testicular Cancer  Depending on your age and overall health, your health care provider may do certain tests to screen for prostate and testicular cancer.  Talk to your health care provider about any symptoms or concerns you have about testicular or prostate cancer. Skin Cancer  Check your skin from head to toe regularly.  Tell your health care provider about any new moles or changes in moles, especially if:  There is a change in a mole's size, shape, or color.  You have a mole that is larger than a pencil eraser.  Always use sunscreen. Apply sunscreen liberally and repeat throughout the day.  Protect yourself by wearing long sleeves, pants, a wide-brimmed hat, and sunglasses when outside. What should I  know about heart disease, diabetes, and high blood pressure?  If you are 78-63 years of age, have your blood pressure checked every 3-5 years. If you are 64 years of age or older, have your blood pressure checked every year. You should have your blood pressure measured twice-once when you are at a hospital or clinic, and once when you are not at a hospital or clinic. Record the average of the two measurements. To check your blood pressure when you are not at a hospital or clinic, you can use:  An  automated blood pressure machine at a pharmacy.  A home blood pressure monitor.  Talk to your health care provider about your target blood pressure.  If you are between 13-78 years old, ask your health care provider if you should take aspirin to prevent heart disease.  Have regular diabetes screenings by checking your fasting blood sugar level.  If you are at a normal weight and have a low risk for diabetes, have this test once every three years after the age of 25.  If you are overweight and have a high risk for diabetes, consider being tested at a younger age or more often.  A one-time screening for abdominal aortic aneurysm (AAA) by ultrasound is recommended for men aged 60-75 years who are current or former smokers. What should I know about preventing infection? Hepatitis B  If you have a higher risk for hepatitis B, you should be screened for this virus. Talk with your health care provider to find out if you are at risk for hepatitis B infection. Hepatitis C  Blood testing is recommended for:  Everyone born from [birth year] through 1963.  Anyone with known risk factors for hepatitis C. Sexually Transmitted Diseases (STDs)  You should be screened each year for STDs including gonorrhea and chlamydia if:  You are sexually active and are younger than 69 years of age.  You are older than 69 years of age and your health care provider tells you that you are at risk for this type of infection.  Your sexual activity has changed since you were last screened and you are at an increased risk for chlamydia or gonorrhea. Ask your health care provider if you are at risk.  Talk with your health care provider about whether you are at high risk of being infected with HIV. Your health care provider may recommend a prescription medicine to help prevent HIV infection. What else can I do?  Schedule regular health, dental, and eye exams.  Stay current with your vaccines (immunizations).  Do not use  any tobacco products, such as cigarettes, chewing tobacco, and e-cigarettes. If you need help quitting, ask your health care provider.  Limit alcohol intake to no more than 2 drinks per day. One drink equals 12 ounces of beer, 5 ounces of wine, or 1 ounces of hard liquor.  Do not use street drugs.  Do not share needles.  Ask your health care provider for help if you need support or information about quitting drugs.  Tell your health care provider if you often feel depressed.  Tell your health care provider if you have ever been abused or do not feel safe at home. This information is not intended to replace advice given to you by your health care provider. Make sure you discuss any questions you have with your health care provider. Document Released: 02/10/2008 Document Revised: 04/12/2016 Document Reviewed: 05/18/2015 Elsevier Interactive Patient Education  2017 [employer].

## 2017-01-05 NOTE — Assessment & Plan Note (Signed)
Chronic, mildly elevated today. Continue current regimen.

## 2017-01-05 NOTE — Assessment & Plan Note (Signed)
Chronic, stable. DRE with evidence of BPH, PSA reassuring.

## 2017-01-05 NOTE — Assessment & Plan Note (Signed)
Chronic, stable on daily protonix. Continue.

## 2017-01-05 NOTE — Assessment & Plan Note (Signed)
Preventative protocols reviewed and updated unless pt declined. Discussed healthy diet and lifestyle.  

## 2017-01-06 LAB — HEPATITIS C ANTIBODY: HCV AB: NEGATIVE

## 2017-01-17 DIAGNOSIS — R69 Illness, unspecified: Secondary | ICD-10-CM | POA: Diagnosis not present

## 2017-02-05 ENCOUNTER — Encounter: Payer: Self-pay | Admitting: Family Medicine

## 2017-02-14 ENCOUNTER — Other Ambulatory Visit: Payer: Self-pay | Admitting: Family Medicine

## 2017-02-15 ENCOUNTER — Other Ambulatory Visit: Payer: Self-pay | Admitting: Family Medicine

## 2017-02-20 DIAGNOSIS — M25552 Pain in left hip: Secondary | ICD-10-CM | POA: Diagnosis not present

## 2017-02-20 DIAGNOSIS — M25551 Pain in right hip: Secondary | ICD-10-CM | POA: Diagnosis not present

## 2017-02-20 DIAGNOSIS — M1711 Unilateral primary osteoarthritis, right knee: Secondary | ICD-10-CM | POA: Diagnosis not present

## 2017-02-20 DIAGNOSIS — M1712 Unilateral primary osteoarthritis, left knee: Secondary | ICD-10-CM | POA: Diagnosis not present

## 2017-03-14 ENCOUNTER — Other Ambulatory Visit: Payer: Self-pay | Admitting: Family Medicine

## 2017-03-19 ENCOUNTER — Encounter: Payer: Self-pay | Admitting: Internal Medicine

## 2017-03-19 ENCOUNTER — Ambulatory Visit (INDEPENDENT_AMBULATORY_CARE_PROVIDER_SITE_OTHER): Payer: Medicare HMO | Admitting: Internal Medicine

## 2017-03-19 VITALS — BP 130/84 | HR 84 | Ht 70.5 in | Wt 215.0 lb

## 2017-03-19 DIAGNOSIS — E113299 Type 2 diabetes mellitus with mild nonproliferative diabetic retinopathy without macular edema, unspecified eye: Secondary | ICD-10-CM

## 2017-03-19 DIAGNOSIS — Z794 Long term (current) use of insulin: Secondary | ICD-10-CM

## 2017-03-19 MED ORDER — FREESTYLE LIBRE READER DEVI: 1.0000 | Freq: Three times a day (TID) | 1 refills | Status: DC

## 2017-03-19 MED ORDER — FREESTYLE LIBRE SENSOR SYSTEM MISC: 1.0000 | 11 refills | Status: DC

## 2017-03-19 MED ORDER — INSULIN PEN NEEDLE 31G X 5 MM MISC: 3 refills | Status: AC

## 2017-03-19 MED ORDER — METFORMIN HCL 500 MG PO TABS: 1000.0000 mg | ORAL_TABLET | Freq: Two times a day (BID) | ORAL | 3 refills | Status: DC

## 2017-03-19 NOTE — Progress Notes (Signed)
Patient ID: Anthony [name], male   DOB: [birth year], 71 y.o.   MRN: [id number]   HPI: Anthony [name] is a 71 y.o.-year-old male, referred by his PCP, Dr. Danise Mina, for management of DM2, dx in ~2006, insulin-dependent since 2008, uncontrolled, with complications (mild nonprolif. DR w/o macular edema, ED).  Last hemoglobin A1c was: Lab Results  Component Value Date   HGBA1C 8.0 (H) 01/05/2017   HGBA1C 8.1 (H) 10/05/2016   HGBA1C 7.9 (H) 04/13/2015   Pt is on a regimen of: - Levemir 50 units at bedtime - Novolog 12-20, 24 units 3x a day, before meals  Pt checks his sugars 3x a day and they are: - am: 61, 95-223, 263, 272 - 2h after b'fast: n/c - before lunch: 79, 87-225, 394 - 2h after lunch: n/c - before dinner: 117-345, 506 - 2h after dinner: n/c - bedtime: n/c - nighttime: n/c No lows. Lowest sugar was 40 -2x in last month; he has hypoglycemia awareness at 60.  Highest sugar was 500s.  Glucometer: One Touch Ultra  Pt's meals are: - Breakfast: 2 poached eggs + 1 slice bacon + 3-4 kielbasa slices - Lunch: ham and cheese lettuce tomato and onion sandwich - Dinner: meat + green beans and corn + coleslaw - Snacks: not usually  - no CKD, last BUN/creatinine:  Lab Results  Component Value Date   BUN 18 01/05/2017   BUN 20 10/05/2016   CREATININE 0.88 01/05/2017   CREATININE 0.96 10/05/2016  On Losartan 100 mg daily. - last set of lipids: Lab Results  Component Value Date   CHOL 122 01/05/2017   HDL 39.90 01/05/2017   LDLCALC 62 01/05/2017   LDLDIRECT 94.0 10/05/2016   TRIG 101.0 01/05/2017   CHOLHDL 3 01/05/2017  On Simvastatin 20 mg daily. - last eye exam was in 10/2016. Stabel DR. [place]. - no numbness and tingling in his feet.  Pt has FH of DM in brother, m aunt - both type 1.  ROS: Constitutional: no weight gain/loss, + fatigue, no subjective hyperthermia/hypothermia, + nocturia Eyes: no blurry vision, no xerophthalmia ENT: no sore throat,  no nodules palpated in throat, no dysphagia/odynophagia, no hoarseness Cardiovascular: no CP/+ SOB/no palpitations/leg swelling Respiratory: no cough/+ SOB Gastrointestinal: no N/V/D/C/+ acid reflux Musculoskeletal: + muscle/+ joint aches Skin: no rashes Neurological: no tremors/numbness/tingling/dizziness Psychiatric: no depression/anxiety + diff with erections  Past Medical History:  Diagnosis Date  . Anxiety   . Arthritis    a little  . Barrett's esophagus 12/08/2015   By EGD 2010 [name])   . BPH with obstruction/lower urinary tract symptoms 2014   s/p TURP  . Depression   . DKA (diabetic ketoacidoses) ([place]) 01/2010   "in coma"  . GERD (gastroesophageal reflux disease)   . H/O bronchitis    after every cold  . HTN (hypertension)   . Hyperlipidemia    controlled with medicine  . Otitis media   . Rheumatic fever    maybe as child  . 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 ABI  12/2013   WNL  . APPENDECTOMY    . CHOLECYSTECTOMY     years ago  . COLONOSCOPY  01/2009   TA, rec rpt 5 yrs [name])  . ESOPHAGOGASTRODUODENOSCOPY  01/2009   biopsy with Barrett's  . LAPAROSCOPIC APPENDECTOMY  07/05/2011   Procedure: APPENDECTOMY LAPAROSCOPIC;  Surgeon: [name],  MD;  Location: [facility];  Service: General;  Laterality: N/A;  . TRANSURETHRAL RESECTION OF PROSTATE N/A 07/28/2013   Procedure: TRANSURETHRAL RESECTION OF THE PROSTATE WITH GYRUS INSTRUMENTS;  Surgeon: [name], MD  . VASECTOMY    . WISDOM TOOTH EXTRACTION     Social History   Social History  . Marital status: Married    Spouse name: N/A  . Number of children: N/A  . Years of education: N/A   Occupational History  . Not on file.   Social History Main Topics  . Smoking status: Former Smoker    Packs/day: 0.30    Years: 50.00    Types: Cigarettes    Quit date:  08/29/2011  . Smokeless tobacco: Never Used  . Alcohol use 0.0 oz/week     Comment: "a couple drinks per night"  . Drug use: No  . Sexual activity: Not on file   Other Topics Concern  . Not on file   Social History Narrative   MD ROSTER:   GI [name]   GS - Young      Daily Caffeine Use:  2   Owner/operater of dental lab-sold, but continues to work. Fully retired.   Married 1969   2 daughters 1972, 1976; 1 son 15   7 grandchildren   Edu: 10th Grade   Hobby: car restoration: has '66 Vette, two classic Chevelle SS's and is restoring a '37 chevy coup   Regular Exercise -  NO   Diet: good water, fruits/vegetables daily   Current Outpatient Prescriptions on File Prior to Visit  Medication Sig Dispense Refill  . amLODipine (NORVASC) 5 MG tablet TAKE 1 TABLET (5 MG TOTAL) BY MOUTH DAILY. 30 tablet 3  . B-D ULTRAFINE III SHORT PEN 31G X 8 MM MISC USE AS DIRECTED WITH INSULIN INJECTIONS  11  . insulin aspart (NOVOLOG FLEXPEN) 100 UNIT/ML FlexPen Inject 15-18 Units into the skin 3 (three) times daily with meals. 6 pen 3  . Insulin Detemir (LEVEMIR FLEXTOUCH) 100 UNIT/ML Pen Inject 50 Units into the skin daily at 10 pm. 5 pen 3  . losartan-hydrochlorothiazide (HYZAAR) 100-25 MG tablet TAKE 1 TABLET BY MOUTH DAILY. 30 tablet 3  . ONE TOUCH ULTRA TEST test strip USE TO CHECK SUGAR UP TO 5 TIMES DAILY AS NEEDED. DX: E11.329 300 each 3  . pantoprazole (PROTONIX) 40 MG tablet TAKE 1 TABLET (40 MG TOTAL) BY MOUTH DAILY. 30 tablet 3  . potassium chloride (KLOR-CON M10) 10 MEQ tablet Take 1 tablet (10 mEq total) by mouth 2 (two) times daily. 60 tablet 3  . simvastatin (ZOCOR) 20 MG tablet TAKE 1 TABLET (20 MG TOTAL) BY MOUTH AT BEDTIME. 30 tablet 3  . aspirin EC 81 MG tablet Take 1 tablet (81 mg total) by mouth daily. (Patient not taking: Reported on 03/19/2017)    . sildenafil (REVATIO) 20 MG tablet Take 2-4 tablets (40-80 mg total) by mouth daily as needed (relations). (Patient not taking:  Reported on 03/19/2017) 30 tablet 3   No current facility-administered medications on file prior to visit.    No Known Allergies Family History  Problem Relation Age of Onset  . Lymphoma Mother        Non-hodgkins  . Coronary artery disease Mother   . Heart disease Mother   . Cancer Father   . Alcohol abuse Father   . Diabetes Brother   . Pancreatic cancer Maternal Uncle   . Diabetes Brother   . Diabetes Brother   .  Diabetes Other   . Colon cancer Neg Hx   . Lung cancer Neg Hx     PE: BP 130/84 (BP Location: Left Arm, Patient Position: Sitting)   Pulse 84   Ht 5' 10.5" (1.791 m)   Wt 215 lb (97.5 kg)   SpO2 96%   BMI 30.41 kg/m  Wt Readings from Last 3 Encounters:  03/19/17 215 lb (97.5 kg)  01/05/17 217 lb (98.4 kg)  12/29/16 215 lb 8 oz (97.8 kg)   Constitutional: overweight, in NAD Eyes: PERRLA, EOMI, no exophthalmos ENT: moist mucous membranes, no thyromegaly, no cervical lymphadenopathy Cardiovascular: RRR, No MRG Respiratory: CTA B Gastrointestinal: abdomen soft, NT, ND, BS+ Musculoskeletal: no deformities, strength intact in all 4 Skin: moist, warm, no rashes Neurological: no tremor with outstretched hands, DTR normal in all 4  ASSESSMENT: 1. DM2, insulin-dependent, uncontrolled, with complications - mild nonprolif. DR w/o macular edema - ED  PLAN:  1. Patient with long-standing, uncontrolled diabetes, on basal-bolus insulin regimen, which became insufficient. Sugars are very fluctuating without a clear pattern. - at this visit, we discussed about improving his diet (now high fat) and will also try to add Metformin to help reduce his CBG fluctuations. - I also suggested a CGM [facility]) and discussed how this works - OTW< will continue the current regimen but I advised him against using >20 units insulin with meals - I suggested to:  Patient Instructions  Please continue: - Levemir 50 units at bedtime - Novolog 12-20 3x a day, before  meals  Please start Metformin 500 mg with dinner x 4 days. If you tolerate this well, add another Metformin tablet (500 mg) with breakfast x 4 days. If you tolerate this well, add another metformin tablet with dinner (total 1000 mg) x 4 days. If you tolerate this well, add another metformin tablet with breakfast (total 1000 mg). Continue with 1000 mg of metformin 2x a day with breakfast and dinner.  Please change breakfast to oatmeal or a sandwich with a vegetable spread.  Eliminate mayo from coleslaw - with vinegar and mustard.  Please return in 1.5 months with your sugar log.   - continue checking sugars at different times of the day - check 3 times a day, rotating checks - given sugar log and advised how to fill it and to bring it at next appt  - given foot care handout and explained the principles  - given instructions for hypoglycemia management "15-15 rule"  - advised for yearly eye exams >> he is UTD - Return to clinic in 1.5 mo with sugar log   [name], MD PhD [facility] Endocrinology

## 2017-03-19 NOTE — Patient Instructions (Addendum)
Please continue: - Levemir 50 units at bedtime - Novolog 12-20 3x a day, before meals  Please start Metformin 500 mg with dinner x 4 days. If you tolerate this well, add another Metformin tablet (500 mg) with breakfast x 4 days. If you tolerate this well, add another metformin tablet with dinner (total 1000 mg) x 4 days. If you tolerate this well, add another metformin tablet with breakfast (total 1000 mg). Continue with 1000 mg of metformin 2x a day with breakfast and dinner.  Please change breakfast to oatmeal or a sandwich with a vegetable spread.  Eliminate mayo from coleslaw - with vinegar and mustard.  Please return in 1.5 months with your sugar log.   PATIENT INSTRUCTIONS FOR TYPE 2 DIABETES:  **Please join MyChart!** - see attached instructions about how to join if you have not done so already.  DIET AND EXERCISE Diet and exercise is an important part of diabetic treatment.  We recommended aerobic exercise in the form of brisk walking (working between 40-60% of maximal aerobic capacity, similar to brisk walking) for 150 minutes per week (such as 30 minutes five days per week) along with 3 times per week performing 'resistance' training (using various gauge rubber tubes with handles) 5-10 exercises involving the major muscle groups (upper body, lower body and core) performing 10-15 repetitions (or near fatigue) each exercise. Start at half the above goal but build slowly to reach the above goals. If limited by weight, joint pain, or disability, we recommend daily walking in a swimming pool with water up to waist to reduce pressure from joints while allow for adequate exercise.    BLOOD GLUCOSES Monitoring your blood glucoses is important for continued management of your diabetes. Please check your blood glucoses 2-4 times a day: fasting, before meals and at bedtime (you can rotate these measurements - e.g. one day check before the 3 meals, the next day check before 2 of the meals and  before bedtime, etc.).   HYPOGLYCEMIA (low blood sugar) Hypoglycemia is usually a reaction to not eating, exercising, or taking too much insulin/ other diabetes drugs.  Symptoms include tremors, sweating, hunger, confusion, headache, etc. Treat IMMEDIATELY with 15 grams of Carbs: . 4 glucose tablets .  cup regular juice/soda . 2 tablespoons raisins . 4 teaspoons sugar . 1 tablespoon honey Recheck blood glucose in 15 mins and repeat above if still symptomatic/blood glucose <100.  RECOMMENDATIONS TO REDUCE YOUR RISK OF DIABETIC COMPLICATIONS: * Take your prescribed MEDICATION(S) * Follow a DIABETIC diet: Complex carbs, fiber rich foods, (monounsaturated and polyunsaturated) fats * AVOID saturated/trans fats, high fat foods, >2,300 mg salt per day. * EXERCISE at least 5 times a week for 30 minutes or preferably daily.  * DO NOT SMOKE OR DRINK more than 1 drink a day. * Check your FEET every day. Do not wear tightfitting shoes. Contact us if you develop an ulcer * See your EYE doctor once a year or more if needed * Get a FLU shot once a year * Get a PNEUMONIA vaccine once before and once after age 53 years  GOALS:  * Your Hemoglobin A1c of <7%  * fasting sugars need to be <130 * after meals sugars need to be <180 (2h after you start eating) * Your Systolic BP should be 017 or lower  * Your Diastolic BP should be 80 or lower  * Your HDL (Good Cholesterol) should be 40 or higher  * Your LDL (Bad Cholesterol) should be 100 or  lower. * Your Triglycerides should be 150 or lower  * Your Urine microalbumin (kidney function) should be <30 * Your Body Mass Index should be 25 or lower    Please consider the following ways to cut down carbs and fat and increase fiber and micronutrients in your diet: - substitute whole grain for white bread or pasta - substitute brown rice for white rice - substitute 90-calorie flat bread pieces for slices of bread when possible - substitute sweet potatoes  or yams for white potatoes - substitute humus for margarine - substitute tofu for cheese when possible - substitute almond or rice milk for regular milk (would not drink soy milk daily due to concern for soy estrogen influence on breast cancer risk) - substitute dark chocolate for other sweets when possible - substitute water - can add lemon or orange slices for taste - for diet sodas (artificial sweeteners will trick your body that you can eat sweets without getting calories and will lead you to overeating and weight gain in the long run) - do not skip breakfast or other meals (this will slow down the metabolism and will result in more weight gain over time)  - can try smoothies made from fruit and almond/rice milk in am instead of regular breakfast - can also try old-fashioned (not instant) oatmeal made with almond/rice milk in am - order the dressing on the side when eating salad at a restaurant (pour less than half of the dressing on the salad) - eat as little meat as possible - can try juicing, but should not forget that juicing will get rid of the fiber, so would alternate with eating raw veg./fruits or drinking smoothies - use as little oil as possible, even when using olive oil - can dress a salad with a mix of balsamic vinegar and lemon juice, for e.g. - use agave nectar, stevia sugar, or regular sugar rather than artificial sweateners - steam or broil/roast veggies  - snack on veggies/fruit/nuts (unsalted, preferably) when possible, rather than processed foods - reduce or eliminate aspartame in diet (it is in diet sodas, chewing gum, etc) Read the labels!  Try to read Dr. Janene Harvey book: "Program for Reversing Diabetes" for other ideas for healthy eating.

## 2017-03-20 ENCOUNTER — Other Ambulatory Visit: Payer: Self-pay | Admitting: Family Medicine

## 2017-03-20 ENCOUNTER — Other Ambulatory Visit: Payer: Self-pay

## 2017-03-20 MED ORDER — FREESTYLE LIBRE SENSOR SYSTEM MISC: 11 refills | Status: DC

## 2017-04-05 DIAGNOSIS — M1712 Unilateral primary osteoarthritis, left knee: Secondary | ICD-10-CM | POA: Diagnosis not present

## 2017-04-05 DIAGNOSIS — M1711 Unilateral primary osteoarthritis, right knee: Secondary | ICD-10-CM | POA: Diagnosis not present

## 2017-04-12 DIAGNOSIS — M1712 Unilateral primary osteoarthritis, left knee: Secondary | ICD-10-CM | POA: Diagnosis not present

## 2017-04-12 DIAGNOSIS — M1711 Unilateral primary osteoarthritis, right knee: Secondary | ICD-10-CM | POA: Diagnosis not present

## 2017-04-15 ENCOUNTER — Other Ambulatory Visit: Payer: Self-pay | Admitting: Family Medicine

## 2017-04-16 DIAGNOSIS — R69 Illness, unspecified: Secondary | ICD-10-CM | POA: Diagnosis not present

## 2017-04-19 DIAGNOSIS — M1711 Unilateral primary osteoarthritis, right knee: Secondary | ICD-10-CM | POA: Diagnosis not present

## 2017-04-19 DIAGNOSIS — M1712 Unilateral primary osteoarthritis, left knee: Secondary | ICD-10-CM | POA: Diagnosis not present

## 2017-04-21 ENCOUNTER — Other Ambulatory Visit: Payer: Self-pay | Admitting: Family Medicine

## 2017-05-07 ENCOUNTER — Other Ambulatory Visit: Payer: Self-pay | Admitting: Family Medicine

## 2017-05-07 NOTE — Telephone Encounter (Signed)
Pt was referred to endo in 12/2016. Should we send to endo or do you want to refill?

## 2017-05-13 ENCOUNTER — Other Ambulatory Visit: Payer: Self-pay | Admitting: Family Medicine

## 2017-05-15 DIAGNOSIS — R69 Illness, unspecified: Secondary | ICD-10-CM | POA: Diagnosis not present

## 2017-05-17 DIAGNOSIS — M1711 Unilateral primary osteoarthritis, right knee: Secondary | ICD-10-CM | POA: Diagnosis not present

## 2017-05-18 ENCOUNTER — Encounter: Payer: Self-pay | Admitting: Internal Medicine

## 2017-05-18 ENCOUNTER — Ambulatory Visit (INDEPENDENT_AMBULATORY_CARE_PROVIDER_SITE_OTHER): Payer: Medicare HMO | Admitting: Internal Medicine

## 2017-05-18 VITALS — BP 130/84 | HR 88 | Wt 207.0 lb

## 2017-05-18 DIAGNOSIS — Z23 Encounter for immunization: Secondary | ICD-10-CM

## 2017-05-18 DIAGNOSIS — E785 Hyperlipidemia, unspecified: Secondary | ICD-10-CM

## 2017-05-18 DIAGNOSIS — E113299 Type 2 diabetes mellitus with mild nonproliferative diabetic retinopathy without macular edema, unspecified eye: Secondary | ICD-10-CM

## 2017-05-18 DIAGNOSIS — Z794 Long term (current) use of insulin: Secondary | ICD-10-CM

## 2017-05-18 LAB — POCT GLYCOSYLATED HEMOGLOBIN (HGB A1C): Hemoglobin A1C: 6

## 2017-05-18 MED ORDER — GLUCAGON (RDNA) 1 MG IJ KIT: 1.0000 mg | PACK | Freq: Once | INTRAMUSCULAR | 12 refills | Status: DC | PRN

## 2017-05-18 MED ORDER — INSULIN DETEMIR 100 UNIT/ML FLEXPEN: 30.0000 [IU] | PEN_INJECTOR | Freq: Every day | SUBCUTANEOUS | 11 refills | Status: DC

## 2017-05-18 MED ORDER — INSULIN ASPART 100 UNIT/ML FLEXPEN: 10.0000 [IU] | PEN_INJECTOR | Freq: Three times a day (TID) | SUBCUTANEOUS | 11 refills | Status: DC

## 2017-05-18 NOTE — Progress Notes (Signed)
Patient ID: Anthony [name], male   DOB: March 04, 1946, 71 y.o.   MRN: [id number]   HPI: Anthony [name] is a 71 y.o.-year-old male,returning for f/u for DM2, dx in ~2006, insulin-dependent since 2008, uncontrolled, with complications (mild nonprolif. DR w/o macular edema, ED). Last visit 2 mo ago.  He changed his diet after our discussion at last visit >> lost 8 lbs in last 2 months. His sugars also started to improve.  He had 3 low CBG episodes at night: 40-50, despite decreasing her insulin doses.  Last hemoglobin A1c was: Lab Results  Component Value Date   HGBA1C 8.0 (H) 01/05/2017   HGBA1C 8.1 (H) 10/05/2016   HGBA1C 7.9 (H) 04/13/2015   Pt is on a regimen of: - Levemir 50 >> (30-)40 units at bedtime - Novolog 12-20 >> 10-15 units 3x a day, before meals - Metformin 1000 mg 2x a day - started 02/2017 - He has some diarrhea with a full dose, he backed off-  now increasing it back.  Pt checks his sugars 4x a day: - am: 61, 95-223, 263, 272 >> 58, 78-191, 281 - 2h after b'fast: n/c - before lunch: 79, 87-225, 394 >> 80-239 - 2h after lunch: n/c - before dinner: 117-345, 506 >> 40, 57-229 - 2h after dinner: n/c - bedtime: n/c >> 107-309 - nighttime: n/c No lows. Lowest sugar was 40 -2x in last month >> 40s; he has hypoglycemia awareness at 60.  Highest sugar was 500s >> 300.  Glucometer: One Touch Ultra. He was not able to obtain the FreeStyle libre CGM as this was costly   Pt's meals are: - Breakfast: 2 poached eggs + 1 slice bacon + 3-4 kielbasa slices - Lunch: ham and cheese lettuce tomato and onion sandwich - Dinner: meat + green beans and corn + coleslaw - Snacks: not usually  - No CKD, last BUN/creatinine:  Lab Results  Component Value Date   BUN 18 01/05/2017   BUN 20 10/05/2016   CREATININE 0.88 01/05/2017   CREATININE 0.96 10/05/2016  On Losartan 100. - last set of lipids: Lab Results  Component Value Date   CHOL 122 01/05/2017   HDL 39.90 01/05/2017    LDLCALC 62 01/05/2017   LDLDIRECT 94.0 10/05/2016   TRIG 101.0 01/05/2017   CHOLHDL 3 01/05/2017  On Simvastatin 20. - last eye exam was in 10/2016 >> stable DR. Waggaman. - denies numbness and tingling in his feet.  ROS: Constitutional: + weight loss, + fatigue, no subjective hyperthermia, no subjective hypothermia Eyes: no blurry vision, no xerophthalmia ENT: no sore throat, no nodules palpated in throat, no dysphagia, no odynophagia, no hoarseness Cardiovascular: no CP/no SOB/no palpitations/no leg swelling Respiratory: no cough/no SOB/no wheezing Gastrointestinal: no N/no V/+ D/no C/no acid reflux Musculoskeletal: no muscle aches/no joint aches Skin: no rashes, no hair loss Neurological: no tremors/no numbness/no tingling/no dizziness  I reviewed pt's medications, allergies, PMH, social hx, family hx, and changes were documented in the history of present illness. Otherwise, unchanged from my initial visit note.   Past Medical History:  Diagnosis Date  . Anxiety   . Arthritis    a little  . Barrett's esophagus 12/08/2015   By EGD 2010 [name])   . BPH with obstruction/lower urinary tract symptoms 2014   s/p TURP  . Depression   . DKA (diabetic ketoacidoses) ([place]) 01/2010   "in coma"  . GERD (gastroesophageal reflux disease)   . H/O bronchitis    after every cold  .  HTN (hypertension)   . Hyperlipidemia    controlled with medicine  . Otitis media   . Rheumatic fever    maybe as child  . 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 ABI  12/2013   WNL  . APPENDECTOMY    . CHOLECYSTECTOMY     years ago  . COLONOSCOPY  01/2009   TA, rec rpt 5 yrs [name])  . ESOPHAGOGASTRODUODENOSCOPY  01/2009   biopsy with Barrett's  . LAPAROSCOPIC APPENDECTOMY  07/05/2011   Procedure: APPENDECTOMY LAPAROSCOPIC;  Surgeon: [name], MD;   Location: [facility];  Service: General;  Laterality: N/A;  . TRANSURETHRAL RESECTION OF PROSTATE N/A 07/28/2013   Procedure: TRANSURETHRAL RESECTION OF THE PROSTATE WITH GYRUS INSTRUMENTS;  Surgeon: [name], MD  . VASECTOMY    . WISDOM TOOTH EXTRACTION     Social History   Social History  . Marital status: Married    Spouse name: N/A  . Number of children: N/A  . Years of education: N/A   Occupational History  . Not on file.   Social History Main Topics  . Smoking status: Former Smoker    Packs/day: 0.30    Years: 50.00    Types: Cigarettes    Quit date: 08/29/2011  . Smokeless tobacco: Never Used  . Alcohol use 0.0 oz/week     Comment: "a couple drinks per night"  . Drug use: No  . Sexual activity: Not on file   Other Topics Concern  . Not on file   Social History Narrative   MD ROSTER:   GI [name]   GS - Young      Daily Caffeine Use:  2   Owner/operater of dental lab-sold, but continues to work. Fully retired.   Married 1969   2 daughters 1972, 1976; 1 son 36   7 grandchildren   Edu: 10th Grade   Hobby: car restoration: has '66 Vette, two classic Chevelle SS's and is restoring a '37 chevy coup   Regular Exercise -  NO   Diet: good water, fruits/vegetables daily   Current Outpatient Prescriptions on File Prior to Visit  Medication Sig Dispense Refill  . amLODipine (NORVASC) 5 MG tablet TAKE 1 TABLET (5 MG TOTAL) BY MOUTH DAILY. 30 tablet 3  . aspirin EC 81 MG tablet Take 1 tablet (81 mg total) by mouth daily. (Patient not taking: Reported on 03/19/2017)    . Continuous Blood Gluc Receiver (FREESTYLE LIBRE READER) DEVI 1 Device by Does not apply route 3 (three) times daily. 1 Device 1  . Continuous Blood Gluc Sensor (FREESTYLE LIBRE SENSOR SYSTEM) MISC Change sensor every 10 days. 3 each 11  . insulin aspart (NOVOLOG FLEXPEN) 100 UNIT/ML FlexPen Inject 15-18 Units into the skin 3 (three) times daily with meals. 6 pen 3  . Insulin Detemir (LEVEMIR  FLEXTOUCH) 100 UNIT/ML Pen Inject 50 Units into the skin daily at 10 pm. 5 pen 11  . Insulin Pen Needle (ADVOCATE INSULIN PEN NEEDLES) 31G X 5 MM MISC Use 3x a day 300 each 3  . losartan-hydrochlorothiazide (HYZAAR) 100-25 MG tablet TAKE 1 TABLET BY MOUTH DAILY. 30 tablet 9  . metFORMIN (GLUCOPHAGE) 500 MG tablet Take 2 tablets (1,000 mg total) by mouth 2 (two) times daily with a meal. 360 tablet 3  . ONE TOUCH ULTRA TEST test strip USE TO CHECK SUGAR UP  TO 5 TIMES DAILY AS NEEDED. DX: E11.329 100 each 3  . pantoprazole (PROTONIX) 40 MG tablet TAKE 1 TABLET (40 MG TOTAL) BY MOUTH DAILY. 30 tablet 3  . pantoprazole (PROTONIX) 40 MG tablet TAKE 1 TABLET BY MOUTH ONCE A DAY 30 tablet 12  . potassium chloride (KLOR-CON M10) 10 MEQ tablet Take 1 tablet (10 mEq total) by mouth 2 (two) times daily. 60 tablet 3  . sildenafil (REVATIO) 20 MG tablet Take 2-4 tablets (40-80 mg total) by mouth daily as needed (relations). (Patient not taking: Reported on 03/19/2017) 30 tablet 3  . simvastatin (ZOCOR) 20 MG tablet TAKE 1 TABLET (20 MG TOTAL) BY MOUTH AT BEDTIME. 30 tablet 3   No current facility-administered medications on file prior to visit.    No Known Allergies Family History  Problem Relation Age of Onset  . Lymphoma Mother        Non-hodgkins  . Coronary artery disease Mother   . Heart disease Mother   . Cancer Father   . Alcohol abuse Father   . Diabetes Brother   . Pancreatic cancer Maternal Uncle   . Diabetes Brother   . Diabetes Brother   . Diabetes Other   . Colon cancer Neg Hx   . Lung cancer Neg Hx    Pt has FH of DM in brother, m aunt - both type 1.  PE: BP 130/84 (BP Location: Left Arm, Patient Position: Sitting)   Pulse 88   Wt 207 lb (93.9 kg)   SpO2 95%   BMI 29.28 kg/m  Wt Readings from Last 3 Encounters:  05/18/17 207 lb (93.9 kg)  03/19/17 215 lb (97.5 kg)  01/05/17 217 lb (98.4 kg)   Constitutional: overweight, in NAD Eyes: PERRLA, EOMI, no exophthalmos ENT:  moist mucous membranes, no thyromegaly, no cervical lymphadenopathy Cardiovascular: RRR, No MRG Respiratory: CTA B Gastrointestinal: abdomen soft, NT, ND, BS+ Musculoskeletal: no deformities, strength intact in all 4 Skin: moist, warm, no rashes Neurological: no tremor with outstretched hands, DTR normal in all 4  ASSESSMENT: 1. DM2, insulin-dependent, uncontrolled, with complications - mild nonprolif. DR w/o macular edema - ED  2. HL  PLAN:  1. Patient with long-standing, uncontrolled DM, on basal-bolus insulin regimen + Metformin added at last visit. At last visit, sugars were very fluctuating, w/o a clear pattern. We added Metformin, advised him not to use more than 20 units of Humalog per meal,  and discussed about improvement in his diet  - At this visit, his sugars are much better, improved to the point of lows at night. I sent a new prescription for glucagon to his pharmacy, but we also need to reduce his basal insulin dose. He also lost weight after improving his diet. - at last visit, I suggested a CGM [facility]) >> could not get this b/c expensive - I suggested to:  Patient Instructions  Please decrease: - Levemir to 30 units at bedtime  Please continue: - Metformin 1000 mg 2x a day - Novolog 10-14 3x a day, before meals  Please return in 3 months with your sugar log.   - today, HbA1c is 6% (great!) - continue checking sugars at different times of the day - check 3x a day, rotating checks - advised for yearly eye exams >> he is UTD - Will give him the flu shot and shingles vaccine today  - Return to clinic in 3 mo with sugar log   2. HL - reviewed latest Lipid panel form  12/2016 >> at goal - continue statin. No SEs - Improving his diet to also help   [name], MD PhD [facility] Endocrinology

## 2017-05-18 NOTE — Addendum Note (Signed)
Addended by: Caprice Beaver T on: 05/18/2017 03:27 PM   Modules accepted: Orders

## 2017-05-18 NOTE — Patient Instructions (Addendum)
Please decrease: - Levemir to 30 units at bedtime  Please continue: - Metformin 1000 mg 2x a day - Novolog 10-14 3x a day, before meals  Please return in 3 months with your sugar log.

## 2017-05-18 NOTE — Addendum Note (Signed)
Addended by: Caprice Beaver T on: 05/18/2017 02:29 PM   Modules accepted: Orders

## 2017-05-22 ENCOUNTER — Telehealth: Payer: Self-pay | Admitting: Family Medicine

## 2017-05-22 NOTE — Telephone Encounter (Signed)
Received clearance request from Dr Berenice Primas for R knee replacement. plz call to schedule preop eval.  I will keep paperwork.

## 2017-05-23 NOTE — Telephone Encounter (Signed)
Noted. Thanks.

## 2017-05-23 NOTE — Telephone Encounter (Signed)
Spoke to pt. He wants to put his off for now due to his wife possibly needing something done about her back.

## 2017-06-07 ENCOUNTER — Other Ambulatory Visit: Payer: Self-pay | Admitting: Family Medicine

## 2017-06-11 ENCOUNTER — Other Ambulatory Visit: Payer: Self-pay | Admitting: Family Medicine

## 2017-06-12 DIAGNOSIS — R69 Illness, unspecified: Secondary | ICD-10-CM | POA: Diagnosis not present

## 2017-07-10 DIAGNOSIS — R69 Illness, unspecified: Secondary | ICD-10-CM | POA: Diagnosis not present

## 2017-08-08 ENCOUNTER — Other Ambulatory Visit: Payer: Self-pay | Admitting: Family Medicine

## 2017-08-15 DIAGNOSIS — R69 Illness, unspecified: Secondary | ICD-10-CM | POA: Diagnosis not present

## 2017-08-17 ENCOUNTER — Encounter: Payer: Self-pay | Admitting: Internal Medicine

## 2017-08-17 ENCOUNTER — Ambulatory Visit: Payer: Medicare HMO | Admitting: Internal Medicine

## 2017-08-17 VITALS — BP 122/74 | HR 98 | Ht 71.0 in | Wt 208.4 lb

## 2017-08-17 DIAGNOSIS — E785 Hyperlipidemia, unspecified: Secondary | ICD-10-CM | POA: Diagnosis not present

## 2017-08-17 DIAGNOSIS — E113299 Type 2 diabetes mellitus with mild nonproliferative diabetic retinopathy without macular edema, unspecified eye: Secondary | ICD-10-CM

## 2017-08-17 DIAGNOSIS — Z794 Long term (current) use of insulin: Secondary | ICD-10-CM

## 2017-08-17 LAB — POCT GLYCOSYLATED HEMOGLOBIN (HGB A1C): Hemoglobin A1C: 6.9

## 2017-08-17 MED ORDER — METFORMIN HCL ER 500 MG PO TB24: ORAL_TABLET | ORAL | 3 refills | Status: DC

## 2017-08-17 NOTE — Progress Notes (Signed)
Patient ID: Anthony [name], male   DOB: [birth year], 71 y.o.   MRN: [id number]   HPI: Anthony [name] is a 71 y.o.-year-old male,returning for f/u for DM2, dx in ~2006, insulin-dependent since 2008, uncontrolled, with complications (mild nonprolif. DR w/o macular edema, ED). Last visit 3 mo ago.  At last visit, his HbA1c was much better after he improved his diet following our discussion.  He was actually starting to have low blood sugars, so we had to reduce his Levemir dose.  Last hemoglobin A1c was: Lab Results  Component Value Date   HGBA1C 6.0 05/18/2017   HGBA1C 8.0 (H) 01/05/2017   HGBA1C 8.1 (H) 10/05/2016   Pt is on a regimen of: - Levemir 50 >> (30-)40 >> 30 - but taking 40-50 units instead! - Novolog 12-20 >> 12-17 units 3x a day, before meals - Metformin 1000 mg 2x a day - started 02/2017.  He had some diarrhea with the full dose, now only on 1000 mg in am.  Pt checks his sugars 4x a day >> higher: - am: 61, 95-223, 263, 272 >> 58, 78-191, 281 >> 79, 94-245, 503 - 2h after b'fast: n/c - before lunch: 79, 87-225, 394 >> 80-239 >> 104-265 - 2h after lunch: n/c - before dinner: 117-345, 506 >> 40, 57-229 >> 51, 83-370 - 2h after dinner: n/c - bedtime: n/c >> 107-309 >> 179-371 - nighttime: n/c Lowest sugar was 40s >> 51; he has hypoglycemia awareness at 60.  Highest sugar was 500s >> 300 >> 500.  Glucometer: One Touch ultra.  Pt's meals are: - Breakfast: 2 poached eggs + 1 slice bacon + 3-4 kielbasa slices - Lunch: ham and cheese lettuce tomato and onion sandwich - Dinner: meat + green beans and corn + coleslaw - Snacks: not usually  -No CKD, last BUN/creatinine:  Lab Results  Component Value Date   BUN 18 01/05/2017   BUN 20 10/05/2016   CREATININE 0.88 01/05/2017   CREATININE 0.96 10/05/2016  On losartan 100. -+ HL; last set of lipids: Lab Results  Component Value Date   CHOL 122 01/05/2017   HDL 39.90 01/05/2017   LDLCALC 62 01/05/2017   LDLDIRECT  94.0 10/05/2016   TRIG 101.0 01/05/2017   CHOLHDL 3 01/05/2017  On simvastatin 20. - last eye exam was in 10/2016: Stable DR [facility]. -Denies numbness and tingling in his feet.  ROS: Constitutional: no weight gain/no weight loss, no fatigue, no subjective hyperthermia, no subjective hypothermia, + nocturia Eyes: no blurry vision, no xerophthalmia ENT: no sore throat, no nodules palpated in throat, no dysphagia, no odynophagia, no hoarseness Cardiovascular: no CP/no SOB/no palpitations/no leg swelling Respiratory: no cough/no SOB/no wheezing Gastrointestinal: no N/no V/+ D/no C/no acid reflux Musculoskeletal: no muscle aches/no joint aches Skin: no rashes, no hair loss Neurological: no tremors/no numbness/no tingling/no dizziness  I reviewed pt's medications, allergies, PMH, social hx, family hx, and changes were documented in the history of present illness. Otherwise, unchanged from my initial visit note.  Past Medical History:  Diagnosis Date  . Anxiety   . Arthritis    a little  . Barrett's esophagus 12/08/2015   By EGD 2010 [name])   . BPH with obstruction/lower urinary tract symptoms 2014   s/p TURP  . Depression   . DKA (diabetic ketoacidoses) ([place]) 01/2010   "in coma"  . GERD (gastroesophageal reflux disease)   . H/O bronchitis    after every cold  . HTN (hypertension)   .  Hyperlipidemia    controlled with medicine  . Otitis media   . Rheumatic fever    maybe as child  . 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 ABI  12/2013   WNL  . APPENDECTOMY    . CHOLECYSTECTOMY     years ago  . COLONOSCOPY  01/2009   TA, rec rpt 5 yrs [name])  . ESOPHAGOGASTRODUODENOSCOPY  01/2009   biopsy with Barrett's  . LAPAROSCOPIC APPENDECTOMY  07/05/2011   Procedure: APPENDECTOMY LAPAROSCOPIC;  Surgeon: [name], MD;  Location: [facility];  Service: General;  Laterality: N/A;  . TRANSURETHRAL RESECTION OF PROSTATE N/A 07/28/2013   Procedure: TRANSURETHRAL RESECTION OF THE PROSTATE WITH GYRUS INSTRUMENTS;  Surgeon: [name], MD  . VASECTOMY    . WISDOM TOOTH EXTRACTION     Social History   Socioeconomic History  . Marital status: Married    Spouse name: Not on file  . Number of children: Not on file  . Years of education: Not on file  . Highest education level: Not on file  Social Needs  . Financial resource strain: Not on file  . Food insecurity - worry: Not on file  . Food insecurity - inability: Not on file  . Transportation needs - medical: Not on file  . Transportation needs - non-medical: Not on file  Occupational History  . Not on file  Tobacco Use  . Smoking status: Former Smoker    Packs/day: 0.30    Years: 50.00    Pack years: 15.00    Types: Cigarettes    Last attempt to quit: 08/29/2011    Years since quitting: 5.9  . Smokeless tobacco: Never Used  Substance and Sexual Activity  . Alcohol use: Yes    Alcohol/week: 0.0 oz    Comment: "a couple drinks per night"  . Drug use: No  . Sexual activity: Not on file  Other Topics Concern  . Not on file  Social History Narrative   MD ROSTER:   GI [name]   GS - Young      Daily Caffeine Use:  2   Owner/operater of dental lab-sold, but continues to work. Fully retired.   Married 1969   2 daughters 1972, 1976; 1 son 29   7 grandchildren   Edu: 10th Grade   Hobby: car restoration: has '66 Vette, two classic Chevelle SS's and is restoring a '37 chevy coup   Regular Exercise -  NO   Diet: good water, fruits/vegetables daily   Current Outpatient Medications on File Prior to Visit  Medication Sig Dispense Refill  . amLODipine (NORVASC) 5 MG tablet TAKE 1 TABLET (5 MG TOTAL) BY MOUTH DAILY. 30 tablet 7  . aspirin EC 81 MG tablet Take 1 tablet (81 mg total) by mouth daily.    [name] Kitchen glucagon (GLUCAGON EMERGENCY) 1 MG injection Inject 1 mg into  the muscle once as needed. 1 each 12  . insulin aspart (NOVOLOG FLEXPEN) 100 UNIT/ML FlexPen Inject 10-14 Units into the skin 3 (three) times daily with meals. 5 pen 11  . Insulin Detemir (LEVEMIR FLEXTOUCH) 100 UNIT/ML Pen Inject 30 Units into the skin daily at 10 pm. 5 pen 11  . Insulin Pen Needle (ADVOCATE INSULIN PEN NEEDLES) 31G X 5 MM MISC Use 3x a day 300 each 3  . losartan-hydrochlorothiazide (HYZAAR) 100-25 MG tablet TAKE 1  TABLET BY MOUTH DAILY. 30 tablet 9  . metFORMIN (GLUCOPHAGE) 500 MG tablet Take 2 tablets (1,000 mg total) by mouth 2 (two) times daily with a meal. 360 tablet 3  . NOVOLOG FLEXPEN 100 UNIT/ML FlexPen INJECT 15-18 UNITS INTO THE SKIN 3 (THREE) TIMES DAILY WITH MEALS. 6 pen 2  . ONE TOUCH ULTRA TEST test strip USE TO CHECK SUGAR UP TO 5 TIMES DAILY AS NEEDED. DX: E11.329 100 each 5  . pantoprazole (PROTONIX) 40 MG tablet TAKE 1 TABLET BY MOUTH ONCE A DAY 30 tablet 12  . potassium chloride (KLOR-CON M10) 10 MEQ tablet Take 1 tablet (10 mEq total) by mouth 2 (two) times daily. 60 tablet 3  . sildenafil (REVATIO) 20 MG tablet Take 2-4 tablets (40-80 mg total) by mouth daily as needed (relations). 30 tablet 3  . simvastatin (ZOCOR) 20 MG tablet TAKE 1 TABLET (20 MG TOTAL) BY MOUTH AT BEDTIME. 30 tablet 3   No current facility-administered medications on file prior to visit.    No Known Allergies Family History  Problem Relation Age of Onset  . Lymphoma Mother        Non-hodgkins  . Coronary artery disease Mother   . Heart disease Mother   . Cancer Father   . Alcohol abuse Father   . Diabetes Brother   . Pancreatic cancer Maternal Uncle   . Diabetes Brother   . Diabetes Brother   . Diabetes Other   . Colon cancer Neg Hx   . Lung cancer Neg Hx    Pt has FH of DM in brother, m aunt - both type 1.  PE: BP 122/74   Pulse 98   Ht 5\' 11"  (1.803 m)   Wt 208 lb 6.4 oz (94.5 kg)   SpO2 96%   BMI 29.07 kg/m  Wt Readings from Last 3 Encounters:  08/17/17 208 lb  6.4 oz (94.5 kg)  05/18/17 207 lb (93.9 kg)  03/19/17 215 lb (97.5 kg)   Constitutional: overweight, in NAD Eyes: PERRLA, EOMI, no exophthalmos ENT: moist mucous membranes, no thyromegaly, no cervical lymphadenopathy Cardiovascular: R tachycardia, R, No MRG Respiratory: CTA B Gastrointestinal: abdomen soft, NT, ND, BS+ Musculoskeletal: no deformities, strength intact in all 4 Skin: moist, warm, no rashes Neurological: no tremor with outstretched hands, DTR normal in all 4  ASSESSMENT: 1. DM2, insulin-dependent, uncontrolled, with complications - mild nonprolif. DR w/o macular edema - ED  Could not get the freestyle libre CGM due to price.  2. HL  PLAN:  1. Patient with long-standing, uncontrolled, diabetes, on basal-bolus insulin regimen + metformin, with worse sugars at this visit due to decreasing the dose of metformin (due to diarrhea), increasing the dose of Levemir to (he started to have lows followed by highs) and also due to dietary indiscretions during the holidays. - We again discussed about the need to reduce the dose of Levemir, which is designed to keep his sugars stable overnight but not decrease them significantly.  I advised him to use a higher dose of NovoLog with dinner and probably with lunch also to avoid the hyperglycemia spikes after these meals.  We also will try to increase the metformin back to thousand milligrams twice a day we will switch to the extended release formulation. - Ultimately, he needs to start improving his diet again and reducing snacks. - I suggested to:  Patient Instructions  Please change from regular to extended release Metformin and increase the dose to 1000 mg 2x a day.  Please decrease Levemir to 35 at bedtime.  Please continue Novolog 12-18 units before meals.  Please return in 3 months with your sugar log.   - today, HbA1c is 6.9% (higher) - continue checking sugars at different times of the day - check 3x a day, rotating  checks - advised for yearly eye exams >> he is UTD - UTD with PNA and flu shot - Return to clinic in 4 mo with sugar log   2. HL - reviewed the latest lipid panel from 12/2016:Lipids at goal - Continue Zocor: No side effects  [name], MD PhD [facility] Endocrinology

## 2017-08-17 NOTE — Addendum Note (Signed)
Addended by: [name] on: 08/17/2017 03:25 PM   Modules accepted: Orders

## 2017-08-17 NOTE — Patient Instructions (Addendum)
Please change from regular to extended release Metformin and increase the dose to 1000 mg 2x a day.  Please decrease Levemir to 35 at bedtime.  Please continue Novolog 12-18 units before meals.  Please return in 3 months with your sugar log.

## 2017-08-31 ENCOUNTER — Other Ambulatory Visit: Payer: Self-pay | Admitting: Family Medicine

## 2017-09-10 DIAGNOSIS — R69 Illness, unspecified: Secondary | ICD-10-CM | POA: Diagnosis not present

## 2017-10-17 DIAGNOSIS — R69 Illness, unspecified: Secondary | ICD-10-CM | POA: Diagnosis not present

## 2017-11-15 ENCOUNTER — Encounter: Payer: Self-pay | Admitting: Internal Medicine

## 2017-11-15 ENCOUNTER — Ambulatory Visit: Payer: Medicare HMO | Admitting: Internal Medicine

## 2017-11-15 VITALS — BP 124/74 | HR 77 | Ht 71.0 in | Wt 211.0 lb

## 2017-11-15 DIAGNOSIS — Z794 Long term (current) use of insulin: Secondary | ICD-10-CM | POA: Diagnosis not present

## 2017-11-15 DIAGNOSIS — E785 Hyperlipidemia, unspecified: Secondary | ICD-10-CM

## 2017-11-15 DIAGNOSIS — E113299 Type 2 diabetes mellitus with mild nonproliferative diabetic retinopathy without macular edema, unspecified eye: Secondary | ICD-10-CM | POA: Diagnosis not present

## 2017-11-15 MED ORDER — GLUCOSE BLOOD VI STRP: ORAL_STRIP | 3 refills | Status: DC

## 2017-11-15 NOTE — Progress Notes (Signed)
Patient ID: Anthony [name], male   DOB: 03/12/1946, 72 y.o.   MRN: [id number]   HPI: Anthony [name] is a 72 y.o.-year-old male,returning for f/u for DM2, dx in ~2006, insulin-dependent since 2008, uncontrolled, with complications (mild nonprolif. DR w/o macular edema, ED). Last visit 3 months ago.  Since last visit, he relaxed his diet a little, and has more eggs and steak.  Last hemoglobin A1c was: Lab Results  Component Value Date   HGBA1C 6.9 08/17/2017   HGBA1C 6.0 05/18/2017   HGBA1C 8.0 (H) 01/05/2017   Pt is on a regimen of: - Metformin ER and an IR - 1000 mg 2x a day. - Levemir 35-40 at bedtime. - Novolog 12-18 units before meals.  Pt checks his sugars 2-4 times a day -very variable: - am: 58, 78-191, 281 >> 79, 94-245, 503 >> 90, 97-213, 313 - 2h after b'fast: n/c - before lunch: 80-239 >> 104-265 >> 63-278, 383, 479 - 2h after lunch: n/c - before dinner: 40, 57-229 >> 51, 83-370 >> 65-228 - 2h after dinner: n/c - bedtime: 107-309 >> 179-371 - nighttime: n/c Lowest sugar was 40s >> 51 >> 59; he has hypoglycemia awareness at 60.  Highest sugar was 500 >> 479.  Glucometer: One Touch ultra  Pt's meals are: - Breakfast: 2 poached eggs + 1 slice bacon + 3-4 kielbasa slices - Lunch: ham and cheese lettuce tomato and onion sandwich - Dinner: meat + green beans and corn + coleslaw - Snacks: not usually  -No CKD, last BUN/creatinine:  Lab Results  Component Value Date   BUN 18 01/05/2017   BUN 20 10/05/2016   CREATININE 0.88 01/05/2017   CREATININE 0.96 10/05/2016  On losartan 100. -+ HL; last set of lipids: Lab Results  Component Value Date   CHOL 122 01/05/2017   HDL 39.90 01/05/2017   LDLCALC 62 01/05/2017   LDLDIRECT 94.0 10/05/2016   TRIG 101.0 01/05/2017   CHOLHDL 3 01/05/2017  On simvastatin 20. - last [facility] was [facility]2016[facility] -no  numbness and tingling in his feet.  ROS: Constitutional: no weight gain/no weight  loss, +  fatigue, no subjective hyperthermia, no subjective hypothermia Eyes: no blurry vision, no xerophthalmia ENT: no sore throat, no nodules palpated in throat, no dysphagia, no odynophagia, no hoarseness Cardiovascular: no CP/no SOB/no palpitations/no leg swelling Respiratory: no cough/no SOB/no wheezing Gastrointestinal: no N/no V/+ D/no C/no acid reflux Musculoskeletal: no muscle aches/no joint aches Skin: no rashes, + hair loss Neurological: no tremors/no numbness/no tingling/no dizziness  I reviewed pt's medications, allergies, PMH, social hx, family hx, and changes were documented in the history of present illness. Otherwise, unchanged from my initial visit note.  Past Medical History:  Diagnosis Date  . Anxiety   . Arthritis    a little  . Barrett's esophagus 12/08/2015   By EGD 2010 [name])   . BPH with obstruction/lower urinary tract symptoms 2014   s/p TURP  . Depression   . DKA (diabetic ketoacidoses) ([place]) 01/2010   "in coma"  . GERD (gastroesophageal reflux disease)   . H/O bronchitis    after every cold  . HTN (hypertension)   . Hyperlipidemia    controlled with medicine  . Otitis media   . Rheumatic fever    maybe as child  . 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  ABI  12/2013   WNL  . APPENDECTOMY    . CHOLECYSTECTOMY     years ago  . COLONOSCOPY  01/2009   TA, rec rpt 5 yrs [name])  . ESOPHAGOGASTRODUODENOSCOPY  01/2009   biopsy with Barrett's  . LAPAROSCOPIC APPENDECTOMY  07/05/2011   Procedure: APPENDECTOMY LAPAROSCOPIC;  Surgeon: [name], MD;  Location: [place];  Service: General;  Laterality: N/A;  . TRANSURETHRAL RESECTION OF PROSTATE N/A 07/28/2013   Procedure: TRANSURETHRAL RESECTION OF THE PROSTATE WITH GYRUS INSTRUMENTS;  Surgeon: [name], MD  . VASECTOMY    . WISDOM TOOTH EXTRACTION      Social History   Socioeconomic History  . Marital status: Married    Spouse name: Not on file  . Number of children: Not on file  . Years of education: Not on file  . Highest education level: Not on file  Occupational History  . Not on file  Social Needs  . Financial resource strain: Not on file  . Food insecurity:    Worry: Not on file    Inability: Not on file  . Transportation needs:    Medical: Not on file    Non-medical: Not on file  Tobacco Use  . Smoking status: Former Smoker    Packs/day: 0.30    Years: 50.00    Pack years: 15.00    Types: Cigarettes    Last attempt to quit: 08/29/2011    Years since quitting: 6.2  . Smokeless tobacco: Never Used  Substance and Sexual Activity  . Alcohol use: Yes    Alcohol/week: 0.0 oz    Comment: "a couple drinks per night"  . Drug use: No  . Sexual activity: Not on file  Lifestyle  . Physical activity:    Days per week: Not on file    Minutes per session: Not on file  . Stress: Not on file  Relationships  . Social connections:    Talks on phone: Not on file    Gets together: Not on file    Attends religious service: Not on file    Active member of club or organization: Not on file    Attends meetings of clubs or organizations: Not on file    Relationship status: Not on file  . Intimate partner violence:    Fear of current or ex partner: Not on file    Emotionally abused: Not on file    Physically abused: Not on file    Forced sexual activity: Not on file  Other Topics Concern  . Not on file  Social History Narrative   MD ROSTER:   GI [name]   GS - Young      Daily Caffeine Use:  2   Owner/operater of dental lab-sold, but continues to work. Fully retired.   Married 1969   2 daughters 1972, 1976; 1 son 66   7 grandchildren   Edu: 10th Grade   Hobby: car restoration: has '66 Vette, two classic Chevelle SS's and is restoring a '37 chevy coup   Regular Exercise -  NO   Diet: good water,  fruits/vegetables daily   Current Outpatient Medications on File Prior to Visit  Medication Sig Dispense Refill  . amLODipine (NORVASC) 5 MG tablet TAKE 1 TABLET (5 MG TOTAL) BY MOUTH DAILY. 30 tablet 7  . aspirin EC 81 MG tablet Take 1 tablet (81 mg total) by mouth daily.    [name] Kitchen glucagon (GLUCAGON EMERGENCY) 1 MG injection Inject 1 mg into  the muscle once as needed. 1 each 12  . insulin aspart (NOVOLOG FLEXPEN) 100 UNIT/ML FlexPen Inject 10-14 Units into the skin 3 (three) times daily with meals. 5 pen 11  . Insulin Detemir (LEVEMIR FLEXTOUCH) 100 UNIT/ML Pen Inject 30 Units into the skin daily at 10 pm. 5 pen 11  . Insulin Pen Needle (ADVOCATE INSULIN PEN NEEDLES) 31G X 5 MM MISC Use 3x a day 300 each 3  . losartan-hydrochlorothiazide (HYZAAR) 100-25 MG tablet TAKE 1 TABLET BY MOUTH DAILY. 30 tablet 9  . metFORMIN (GLUCOPHAGE-XR) 500 MG 24 hr tablet Take 1000 mg 2x a day with meals 360 tablet 3  . NOVOLOG FLEXPEN 100 UNIT/ML FlexPen INJECT 15-18 UNITS INTO THE SKIN 3 (THREE) TIMES DAILY WITH MEALS. 15 pen 4  . ONE TOUCH ULTRA TEST test strip USE TO CHECK SUGAR UP TO 5 TIMES DAILY AS NEEDED. DX: E11.329 100 each 5  . pantoprazole (PROTONIX) 40 MG tablet TAKE 1 TABLET BY MOUTH ONCE A DAY 30 tablet 12  . potassium chloride (KLOR-CON M10) 10 MEQ tablet Take 1 tablet (10 mEq total) by mouth 2 (two) times daily. 60 tablet 3  . sildenafil (REVATIO) 20 MG tablet Take 2-4 tablets (40-80 mg total) by mouth daily as needed (relations). 30 tablet 3  . simvastatin (ZOCOR) 20 MG tablet TAKE 1 TABLET (20 MG TOTAL) BY MOUTH AT BEDTIME. 30 tablet 3   No current facility-administered medications on file prior to visit.    No Known Allergies Family History  Problem Relation Age of Onset  . Lymphoma Mother        Non-hodgkins  . Coronary artery disease Mother   . Heart disease Mother   . Cancer Father   . Alcohol abuse Father   . Diabetes Brother   . Pancreatic cancer Maternal Uncle   . Diabetes Brother    . Diabetes Brother   . Diabetes Other   . Colon cancer Neg Hx   . Lung cancer Neg Hx    Pt has FH of DM in brother, m aunt - both type 1.  PE: BP 124/74   Pulse 77   Ht 5\' 11"  (1.803 m)   Wt 211 lb (95.7 kg)   SpO2 96%   BMI 29.43 kg/m  Wt Readings from Last 3 Encounters:  11/15/17 211 lb (95.7 kg)  08/17/17 208 lb 6.4 oz (94.5 kg)  05/18/17 207 lb (93.9 kg)   Constitutional: overweight, in NAD Eyes: PERRLA, EOMI, no exophthalmos ENT: moist mucous membranes, no thyromegaly, no cervical lymphadenopathy Cardiovascular: RRR, No MRG Respiratory: CTA B Gastrointestinal: abdomen soft, NT, ND, BS+ Musculoskeletal: no deformities, strength intact in all 4 Skin: moist, warm, no rashes Neurological: no tremor with outstretched hands, DTR normal in all 4  ASSESSMENT: 1. DM2, insulin-dependent, uncontrolled, with complications - mild nonprolif. DR w/o macular edema - ED  Could not get the freestyle libre CGM due to price.  2. HL  PLAN:  1. Patient with long-standing, uncontrolled, diabetes, on basal-bolus insulin regimen and metformin, with worse sugars at last visit due to decreasing the dose of metformin due to diarrhea, dietary indiscretions during the holidays, and also increasing the dose of Levemir which caused hypoglycemia.  We changed to metformin ER and increase the dose back to 1000 mg 2x  aday at that time, and also reduce Levemir.  I advised him to use a higher dose of NovoLog with dinner and lunch to avoid hyperglycemic spikes after meals.  I also emphasized  the need to improve his diet and reduce his snacks. - An HbA1c at that time was still at goal, at 6.9%, however higher than before - At this visit, his sugars are still very variable, but with less drastic extremes.  Unfortunately, there is no pattern in the CBG fluctuations, so I cannot adjust his insulin doses for now. - He is now taking 1 tablet of metformin IR and 1 of metformin ER twice a day but he is running  out of the IR metformin and will switch to 1000 mg twice a day of metformin ER.  He is having some diarrhea now so I hope this will improve after switching to the extended release formulation only.   - I also advised him to stay with the lower dose of Levemir, 35 units at bedtime especially since he plans to be more active now that spring is coming - We again discussed about improving diet and I made specific suggestions - I suggested to:  Patient Instructions  Please change to: - Metformin ER 1000 mg 2x a day.  Please continue: - Levemir 35 at bedtime. - Novolog 12-18 units before meals.  Please return in 4 months with your sugar log.   - today we will check an HbA1c - continue checking sugars at different times of the day - check 3x a day, rotating checks - advised for yearly eye exams >> he is UTD - Return to clinic in 4 mo with sugar log   2. HL -Reviewed latest lipid panel from 12/2016: Lipids at goal, LDL improved - continues Zocor without side effects   [name], MD PhD [facility] Endocrinology

## 2017-11-15 NOTE — Patient Instructions (Signed)
Please change to: - Metformin ER 1000 mg 2x a day.  Please continue: - Levemir 35 at bedtime. - Novolog 12-18 units before meals.  Please stop at the lab.  Please return in 4 months with your sugar log.

## 2017-11-16 ENCOUNTER — Telehealth: Payer: Self-pay

## 2017-11-16 DIAGNOSIS — R69 Illness, unspecified: Secondary | ICD-10-CM | POA: Diagnosis not present

## 2017-11-16 LAB — HEMOGLOBIN A1C: HEMOGLOBIN A1C: 6.7 % — AB (ref 4.6–6.5)

## 2017-11-16 NOTE — Telephone Encounter (Signed)
Called patient. Gave lab results. Patient verbalized understanding.  

## 2017-11-16 NOTE — Telephone Encounter (Signed)
-----   Message from [name], MD sent at 11/16/2017 10:05 AM EDT ----- Larey Seat, can you please call pt: HbA1c is a little better!

## 2017-12-27 ENCOUNTER — Other Ambulatory Visit: Payer: Self-pay | Admitting: Family Medicine

## 2017-12-27 DIAGNOSIS — Z794 Long term (current) use of insulin: Principal | ICD-10-CM

## 2017-12-27 DIAGNOSIS — N401 Enlarged prostate with lower urinary tract symptoms: Secondary | ICD-10-CM

## 2017-12-27 DIAGNOSIS — E113299 Type 2 diabetes mellitus with mild nonproliferative diabetic retinopathy without macular edema, unspecified eye: Secondary | ICD-10-CM

## 2017-12-27 DIAGNOSIS — N138 Other obstructive and reflux uropathy: Secondary | ICD-10-CM

## 2017-12-27 DIAGNOSIS — E785 Hyperlipidemia, unspecified: Secondary | ICD-10-CM

## 2018-01-01 ENCOUNTER — Ambulatory Visit (INDEPENDENT_AMBULATORY_CARE_PROVIDER_SITE_OTHER): Payer: Medicare HMO

## 2018-01-01 VITALS — BP 130/80 | HR 69 | Temp 97.6°F | Ht 70.5 in | Wt 207.0 lb

## 2018-01-01 DIAGNOSIS — E785 Hyperlipidemia, unspecified: Secondary | ICD-10-CM | POA: Diagnosis not present

## 2018-01-01 DIAGNOSIS — Z0000 Encounter for general adult medical examination without abnormal findings: Secondary | ICD-10-CM | POA: Diagnosis not present

## 2018-01-01 DIAGNOSIS — N138 Other obstructive and reflux uropathy: Secondary | ICD-10-CM | POA: Diagnosis not present

## 2018-01-01 DIAGNOSIS — N401 Enlarged prostate with lower urinary tract symptoms: Secondary | ICD-10-CM | POA: Diagnosis not present

## 2018-01-01 LAB — COMPREHENSIVE METABOLIC PANEL
ALBUMIN: 4.1 g/dL (ref 3.5–5.2)
ALT: 13 U/L (ref 0–53)
AST: 13 U/L (ref 0–37)
Alkaline Phosphatase: 68 U/L (ref 39–117)
BUN: 18 mg/dL (ref 6–23)
CHLORIDE: 106 meq/L (ref 96–112)
CO2: 30 mEq/L (ref 19–32)
Calcium: 9 mg/dL (ref 8.4–10.5)
Creatinine, Ser: 0.79 mg/dL (ref 0.40–1.50)
GFR: 103.19 mL/min (ref >60.00)
Glucose, Bld: 101 mg/dL — ABNORMAL HIGH (ref 70–99)
Potassium: 3.8 mEq/L (ref 3.5–5.1)
Sodium: 142 mEq/L (ref 135–145)
Total Bilirubin: 0.8 mg/dL (ref 0.2–1.2)
Total Protein: 6 g/dL (ref 6.0–8.3)

## 2018-01-01 LAB — PSA: PSA: 2.19 ng/mL (ref 0.10–4.00)

## 2018-01-01 LAB — LIPID PANEL
Cholesterol: 160 mg/dL (ref 0–200)
HDL: 35.3 mg/dL — AB (ref >39.00)
LDL Cholesterol: 102 mg/dL — ABNORMAL HIGH (ref 0–99)
NONHDL: 124.6
Total CHOL/HDL Ratio: 5
Triglycerides: 111 mg/dL (ref 0.0–149.0)
VLDL: 22.2 mg/dL (ref 0.0–40.0)

## 2018-01-01 NOTE — Patient Instructions (Signed)
Anthony [name] , Thank you for taking time to come for your Medicare Wellness Visit. I appreciate your ongoing commitment to your health goals. Please review the following plan we discussed and let me know if I can assist you in the future.   These are the goals we discussed: Goals    . DIET - INCREASE WATER INTAKE     Starting 01/01/2018, I will continue to drink 3-5 glasses of water daily.        This is a list of the screening recommended for you and due dates:  Health Maintenance  Topic Date Due  . Complete foot exam   01/14/2018*  . Eye exam for diabetics  08/27/2018*  . DTaP/Tdap/Td vaccine (1 - Tdap) 06/19/2023*  . Flu Shot  03/28/2018  . Hemoglobin A1C  05/18/2018  . Colon Cancer Screening  02/02/2019  . Tetanus Vaccine  06/19/2023  .  Hepatitis C: One time screening is recommended by Center for Disease Control  (CDC) for  adults born from [birth year] through 1963.   Completed  . Pneumonia vaccines  Completed  *Topic was postponed. The date shown is not the original due date.   Preventive Care for Adults  A healthy lifestyle and preventive care can promote health and wellness. Preventive health guidelines for adults include the following key practices.  . A routine yearly physical is a good way to check with your health care provider about your health and preventive screening. It is a chance to share any concerns and updates on your health and to receive a thorough exam.  . Visit your dentist for a routine exam and preventive care every 6 months. Brush your teeth twice a day and floss once a day. Good oral hygiene prevents tooth decay and gum disease.  . The frequency of eye exams is based on your age, health, family medical history, use  of contact lenses, and other factors. Follow your health care provider's recommendations for frequency of eye exams.  . Eat a healthy diet. Foods like vegetables, fruits, whole grains, low-fat dairy products, and lean protein foods contain the  nutrients you need without too many calories. Decrease your intake of foods high in solid fats, added sugars, and salt. Eat the right amount of calories for you. Get information about a proper diet from your health care provider, if necessary.  . Regular physical exercise is one of the most important things you can do for your health. Most adults should get at least 150 minutes of moderate-intensity exercise (any activity that increases your heart rate and causes you to sweat) each week. In addition, most adults need muscle-strengthening exercises on 2 or more days a week.  Silver Sneakers may be a benefit available to you. To determine eligibility, you may visit the website: www.silversneakers.com or contact program at 226-099-5783 Mon-Fri between 8AM-8PM.   . Maintain a healthy weight. The body mass index (BMI) is a screening tool to identify possible weight problems. It provides an estimate of body fat based on height and weight. Your health care provider can find your BMI and can help you achieve or maintain a healthy weight.   For adults 20 years and older: ? A BMI below 18.5 is considered underweight. ? A BMI of 18.5 to 24.9 is normal. ? A BMI of 25 to 29.9 is considered overweight. ? A BMI of 30 and above is considered obese.   . Maintain normal blood lipids and cholesterol levels by exercising and minimizing your intake of  saturated fat. Eat a balanced diet with plenty of fruit and vegetables. Blood tests for lipids and cholesterol should begin at age 34 and be repeated every 5 years. If your lipid or cholesterol levels are high, you are over 50, or you are at high risk for heart disease, you may need your cholesterol levels checked more frequently. Ongoing high lipid and cholesterol levels should be treated with medicines if diet and exercise are not working.  . If you smoke, find out from your health care provider how to quit. If you do not use tobacco, please do not start.  . If you  choose to drink alcohol, please do not consume more than 2 drinks per day. One drink is considered to be 12 ounces (355 mL) of beer, 5 ounces (148 mL) of wine, or 1.5 ounces (44 mL) of liquor.  . If you are 88-57 years old, ask your health care provider if you should take aspirin to prevent strokes.  . Use sunscreen. Apply sunscreen liberally and repeatedly throughout the day. You should seek shade when your shadow is shorter than you. Protect yourself by wearing long sleeves, pants, a wide-brimmed hat, and sunglasses year round, whenever you are outdoors.  . Once a month, do a whole body skin exam, using a mirror to look at the skin on your back. Tell your health care provider of new moles, moles that have irregular borders, moles that are larger than a pencil eraser, or moles that have changed in shape or color.

## 2018-01-01 NOTE — Progress Notes (Signed)
Subjective:   Anthony [name] is a 72 y.o. male who presents for Medicare Annual/Subsequent preventive examination.  Review of Systems:  N/A Cardiac Risk Factors include: advanced age (>58men, >10 women);obesity (BMI >30kg/m2);diabetes mellitus;male gender;dyslipidemia;hypertension     Objective:    Vitals: BP 130/80 (BP Location: Right Arm, Patient Position: Sitting, Cuff Size: Normal)   Pulse 69   Temp 97.6 F (36.4 C) (Oral)   Ht 5' 10.5" (1.791 m)   Wt 207 lb (93.9 kg)   SpO2 98%   BMI 29.28 kg/m   Body mass index is 29.28 kg/m.  Advanced Directives 01/01/2018 12/29/2016 07/28/2013 07/28/2013 07/21/2013 07/05/2011  Does Patient Have a Medical Advance Directive? Yes Yes Patient has advance directive, copy not in chart - Patient has advance directive, copy not in chart Patient does not have advance directive;Patient would not like information  Type of Scientist, forensic Power of [place];Living will Living will Living will Living will Living will -  Does patient want to make changes to medical advance directive? - - No - - -  Copy of [place] in Chart? No - copy requested - Copy requested from family Copy requested from family Copy requested from family -  Pre-existing out of facility DNR order (yellow form or pink MOST form) - - No No No No    Tobacco Social History   Tobacco Use  Smoking Status Former Smoker  . Packs/day: 0.30  . Years: 50.00  . Pack years: 15.00  . Types: Cigarettes  . Last attempt to quit: 08/29/2011  . Years since quitting: 6.3  Smokeless Tobacco Never Used     Counseling given: No   Clinical Intake:  Pre-visit preparation completed: Yes  Pain : 0-10 Pain Score: 5  Pain Type: Chronic pain Pain Location: Knee Pain Orientation: Left, Right Pain Onset: More than a month ago Pain Frequency: Constant     Nutritional Status: BMI 25 -29 Overweight Nutritional Risks: None Diabetes: Yes CBG done?: No Did pt.  bring in CBG monitor from home?: No  How often do you need to have someone help you when you read instructions, pamphlets, or other written materials from your doctor or pharmacy?: 1 - Never What is the last grade level you completed in school?: 11th grade  Interpreter Needed?: No  Comments: pt lives with spouse Information entered by :: LPinson, LPN  Past Medical History:  Diagnosis Date  . Anxiety   . Arthritis    a little  . Barrett's esophagus 12/08/2015   By EGD 2010 [name])   . BPH with obstruction/lower urinary tract symptoms 2014   s/p TURP  . Depression   . DKA (diabetic ketoacidoses) ([place]) 01/2010   "in coma"  . GERD (gastroesophageal reflux disease)   . H/O bronchitis    after every cold  . HTN (hypertension)   . Hyperlipidemia    controlled with medicine  . Otitis media   . Rheumatic fever    maybe as child  . 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 ABI  12/2013   WNL  . APPENDECTOMY    . CHOLECYSTECTOMY     years ago  . COLONOSCOPY  01/2009   TA, rec rpt 5 yrs [name])  . ESOPHAGOGASTRODUODENOSCOPY  01/2009   biopsy with Barrett's  . LAPAROSCOPIC APPENDECTOMY  07/05/2011   Procedure: APPENDECTOMY LAPAROSCOPIC;  Surgeon:  [name], MD;  Location: [facility];  Service: General;  Laterality: N/A;  . MENISCUS REPAIR Right 2018   [name] @ [facility]  . TRANSURETHRAL RESECTION OF PROSTATE N/A 07/28/2013   Procedure: TRANSURETHRAL RESECTION OF THE PROSTATE WITH GYRUS INSTRUMENTS;  Surgeon: [name], MD  . VASECTOMY    . WISDOM TOOTH EXTRACTION     Family History  Problem Relation Age of Onset  . Lymphoma Mother        Non-hodgkins  . Coronary artery disease Mother   . Heart disease Mother   . Cancer Father   . Alcohol abuse Father   . Diabetes Brother   . Pancreatic cancer Maternal Uncle   . Diabetes  Brother   . Diabetes Brother   . Diabetes Other   . Colon cancer Neg Hx   . Lung cancer Neg Hx    Social History   Socioeconomic History  . Marital status: Married    Spouse name: Not on file  . Number of children: Not on file  . Years of education: Not on file  . Highest education level: Not on file  Occupational History  . Not on file  Social Needs  . Financial resource strain: Not on file  . Food insecurity:    Worry: Not on file    Inability: Not on file  . Transportation needs:    Medical: Not on file    Non-medical: Not on file  Tobacco Use  . Smoking status: Former Smoker    Packs/day: 0.30    Years: 50.00    Pack years: 15.00    Types: Cigarettes    Last attempt to quit: 08/29/2011    Years since quitting: 6.3  . Smokeless tobacco: Never Used  Substance and Sexual Activity  . Alcohol use: Yes    Alcohol/week: 0.0 oz    Comment: "a couple drinks per night"  . Drug use: No  . Sexual activity: Not Currently  Lifestyle  . Physical activity:    Days per week: Not on file    Minutes per session: Not on file  . Stress: Not on file  Relationships  . Social connections:    Talks on phone: Not on file    Gets together: Not on file    Attends religious service: Not on file    Active member of club or organization: Not on file    Attends meetings of clubs or organizations: Not on file    Relationship status: Not on file  Other Topics Concern  . Not on file  Social History Narrative   MD ROSTER:   GI [name]   GS - [place]      Daily Caffeine Use:  2   Owner/operater of dental lab-sold, but continues to work. Fully retired.   Married 1969   2 daughters 1972, 1976; 1 son 90   7 grandchildren   Edu: 10th Grade   Hobby: car restoration: has '66 Vette, two classic Chevelle SS's and is restoring a '37 chevy coup   Regular Exercise -  NO   Diet: good water, fruits/vegetables daily    Outpatient Encounter Medications as of 01/01/2018  Medication Sig  .  amLODipine (NORVASC) 5 MG tablet TAKE 1 TABLET (5 MG TOTAL) BY MOUTH DAILY.  [name] Kitchen aspirin EC 81 MG tablet Take 1 tablet (81 mg total) by mouth daily.  [name] Kitchen glucagon (GLUCAGON EMERGENCY) 1 MG injection Inject 1 mg into the muscle once as needed.  [name] Kitchen  glucose blood (ONE TOUCH ULTRA TEST) test strip Use as instructed 4x a day  . insulin aspart (NOVOLOG FLEXPEN) 100 UNIT/ML FlexPen Inject 10-14 Units into the skin 3 (three) times daily with meals.  . Insulin Detemir (LEVEMIR FLEXTOUCH) 100 UNIT/ML Pen Inject 30 Units into the skin daily at 10 pm.  . Insulin Pen Needle (ADVOCATE INSULIN PEN NEEDLES) 31G X 5 MM MISC Use 3x a day  . losartan-hydrochlorothiazide (HYZAAR) 100-25 MG tablet TAKE 1 TABLET BY MOUTH DAILY.  . metFORMIN (GLUCOPHAGE-XR) 500 MG 24 hr tablet Take 1000 mg 2x a day with meals  . NOVOLOG FLEXPEN 100 UNIT/ML FlexPen INJECT 15-18 UNITS INTO THE SKIN 3 (THREE) TIMES DAILY WITH MEALS.  [name] Kitchen pantoprazole (PROTONIX) 40 MG tablet TAKE 1 TABLET BY MOUTH ONCE A DAY  . potassium chloride (KLOR-CON M10) 10 MEQ tablet Take 1 tablet (10 mEq total) by mouth 2 (two) times daily.  . sildenafil (REVATIO) 20 MG tablet Take 2-4 tablets (40-80 mg total) by mouth daily as needed (relations).  . simvastatin (ZOCOR) 20 MG tablet TAKE 1 TABLET (20 MG TOTAL) BY MOUTH AT BEDTIME.   No facility-administered encounter medications on file as of 01/01/2018.     Activities of Daily Living In your present state of health, do you have any difficulty performing the following activities: 01/01/2018  Hearing? N  Vision? N  Difficulty concentrating or making decisions? N  Walking or climbing stairs? Y  Dressing or bathing? N  Doing errands, shopping? N  Preparing Food and eating ? N  Using the Toilet? N  In the past six months, have you accidently leaked urine? N  Do you have problems with loss of bowel control? Y  Managing your Medications? N  Managing your Finances? N  Housekeeping or managing your Housekeeping? N  Some  recent data might be hidden    Patient Care Team: [name], MD as PCP - General (Family Medicine) [name], MD (Neurosurgery) [name], MD as Consulting Physician (General Surgery) [name], MD as Consulting Physician (Urology) Thelma Comp, [place] as Consulting Physician (Optometry)   Assessment:   This is a routine wellness examination for Girolamo.   Hearing Screening   125Hz  250Hz  500Hz  1000Hz  2000Hz  3000Hz  4000Hz  6000Hz  8000Hz   Right ear:   40 40 40  40    Left ear:   40 40 40  40      Exercise Activities and Dietary recommendations Current Exercise Habits: The patient does not participate in regular exercise at present, Exercise limited by: orthopedic condition(s)  Goals    . DIET - INCREASE WATER INTAKE     Starting 01/01/2018, I will continue to drink 3-5 glasses of water daily.        Fall Risk Fall Risk  01/01/2018 12/29/2016 07/05/2015 06/30/2014  Falls in the past year? No No No No   Depression Screen PHQ 2/9 Scores 01/01/2018 12/29/2016 07/05/2015 06/30/2014  PHQ - 2 Score 0 0 0 0  PHQ- 9 Score 0 - - -    Cognitive Function MMSE - Mini Mental State Exam 01/01/2018 12/29/2016  Orientation to time 5 5  Orientation to Place 5 5  Registration 3 3  Attention/ Calculation 0 0  Recall 3 3  Language- name 2 objects 0 0  Language- repeat 1 1  Language- follow 3 step command 3 3  Language- read & follow direction 0 0  Write a sentence 0 0  Copy design 0 0  Total score 20 20  PLEASE NOTE: A Mini-Cog screen was completed. Maximum score is 20. A value of 0 denotes this part of Folstein MMSE was not completed or the patient failed this part of the Mini-Cog screening.   Mini-Cog Screening Orientation to Time - Max 5 pts Orientation to Place - Max 5 pts Registration - Max 3 pts Recall - Max 3 pts Language Repeat - Max 1 pts Language Follow 3 Step Command - Max 3 pts     Immunization History  Administered Date(s) Administered  . Influenza Whole  06/02/2008, 06/02/2009, 06/02/2010, 07/08/2011, 07/08/2012  . Influenza, High Dose Seasonal PF 05/18/2017  . Influenza,inj,Quad PF,6+ Mos 07/05/2015  . Influenza-Unspecified 05/28/2013, 05/28/2014, 06/30/2016  . Pneumococcal Conjugate-13 06/18/2013  . Pneumococcal Polysaccharide-23 07/08/2011, 12/29/2016  . Tetanus 06/18/2013  . Zoster 08/26/2014  . Zoster Recombinat (Shingrix) 05/18/2017    Screening Tests Health Maintenance  Topic Date Due  . FOOT EXAM  01/14/2018 (Originally 10/05/2017)  . OPHTHALMOLOGY EXAM  08/27/2018 (Originally 12/06/2017)  . DTaP/Tdap/Td (1 - Tdap) 06/19/2023 (Originally 06/13/1965)  . INFLUENZA VACCINE  03/28/2018  . HEMOGLOBIN A1C  05/18/2018  . COLONOSCOPY  02/02/2019  . TETANUS/TDAP  06/19/2023  . Hepatitis C Screening  Completed  . PNA vac Low Risk Adult  Completed     Plan:     I have personally reviewed, addressed, and noted the following in the patient's chart:  A. Medical and social history B. Use of alcohol, tobacco or illicit drugs  C. Current medications and supplements D. Functional ability and status E.  Nutritional status F.  Physical activity G. Advance directives H. List of other physicians I.  Hospitalizations, surgeries, and ER visits in previous 12 months J.  [place] to include hearing, vision, cognitive, depression L. Referrals and appointments - none  In addition, I have reviewed and discussed with patient certain preventive protocols, quality metrics, and best practice recommendations. A written personalized care plan for preventive services as well as general preventive health recommendations were provided to patient.  See attached scanned questionnaire for additional information.   Signed,   Lindell Noe, MHA, BS, LPN Health Coach

## 2018-01-01 NOTE — Progress Notes (Signed)
PCP notes:   Health maintenance:  Foot exam - PCP please address at next appt Eye exam - addressed  Abnormal screenings:   None  Patient concerns:   Patient requests a referral to podiatry for diabetic foot care.   Nurse concerns:  None  Next PCP appt:   01/14/18 @ 1130  I reviewed health advisor's note, was available for consultation on the day of service listed in this note, and agree with documentation and plan. Defer  eferral to podiatry for diabetic foot care to PCP.   Elsie Stain, MD.

## 2018-01-07 ENCOUNTER — Encounter: Payer: Medicare HMO | Admitting: Family Medicine

## 2018-01-07 NOTE — Progress Notes (Signed)
I reviewed health advisor's note, was available for consultation, and agree with documentation and plan.  

## 2018-01-14 ENCOUNTER — Ambulatory Visit (INDEPENDENT_AMBULATORY_CARE_PROVIDER_SITE_OTHER): Payer: Medicare HMO | Admitting: Family Medicine

## 2018-01-14 ENCOUNTER — Encounter: Payer: Self-pay | Admitting: Family Medicine

## 2018-01-14 ENCOUNTER — Encounter: Payer: Self-pay | Admitting: Nurse Practitioner

## 2018-01-14 VITALS — BP 120/80 | HR 80 | Temp 97.9°F | Ht 71.0 in | Wt 201.5 lb

## 2018-01-14 DIAGNOSIS — K219 Gastro-esophageal reflux disease without esophagitis: Secondary | ICD-10-CM | POA: Diagnosis not present

## 2018-01-14 DIAGNOSIS — N138 Other obstructive and reflux uropathy: Secondary | ICD-10-CM | POA: Diagnosis not present

## 2018-01-14 DIAGNOSIS — Z1211 Encounter for screening for malignant neoplasm of colon: Secondary | ICD-10-CM | POA: Diagnosis not present

## 2018-01-14 DIAGNOSIS — E113299 Type 2 diabetes mellitus with mild nonproliferative diabetic retinopathy without macular edema, unspecified eye: Secondary | ICD-10-CM | POA: Diagnosis not present

## 2018-01-14 DIAGNOSIS — K2270 Barrett's esophagus without dysplasia: Secondary | ICD-10-CM

## 2018-01-14 DIAGNOSIS — E785 Hyperlipidemia, unspecified: Secondary | ICD-10-CM

## 2018-01-14 DIAGNOSIS — I10 Essential (primary) hypertension: Secondary | ICD-10-CM | POA: Diagnosis not present

## 2018-01-14 DIAGNOSIS — Z7189 Other specified counseling: Secondary | ICD-10-CM | POA: Diagnosis not present

## 2018-01-14 DIAGNOSIS — Z0000 Encounter for general adult medical examination without abnormal findings: Secondary | ICD-10-CM

## 2018-01-14 DIAGNOSIS — N401 Enlarged prostate with lower urinary tract symptoms: Secondary | ICD-10-CM | POA: Diagnosis not present

## 2018-01-14 DIAGNOSIS — Z794 Long term (current) use of insulin: Secondary | ICD-10-CM

## 2018-01-14 MED ORDER — PANTOPRAZOLE SODIUM 40 MG PO TBEC: 40.0000 mg | DELAYED_RELEASE_TABLET | Freq: Every day | ORAL | 3 refills | Status: DC

## 2018-01-14 NOTE — Assessment & Plan Note (Signed)
Advanced planning - thinks has living will at home but unsure. Would want wife to be HCPOA. Asked to bring us a copy.  

## 2018-01-14 NOTE — Assessment & Plan Note (Signed)
Chronic, stable. Continue current regimen.  The 10-year ASCVD risk score Mikey Bussing DC Brooke Bonito., et al., 2013) is: 32.4%   Values used to calculate the score:     Age: 72 years     Sex: Male     Is Non-Hispanic African American: No     Diabetic: Yes     Tobacco smoker: No     Systolic Blood Pressure: 045 mmHg     Is BP treated: Yes     HDL Cholesterol: 35.3 mg/dL     Total Cholesterol: 160 mg/dL

## 2018-01-14 NOTE — Assessment & Plan Note (Addendum)
Overdue for f/u EGD/colonoscopy. encouraged he call to schedule appt. Continue PPI daily. Referral placed today

## 2018-01-14 NOTE — Assessment & Plan Note (Signed)
Stable on daily PPI. Continue protonix

## 2018-01-14 NOTE — Assessment & Plan Note (Signed)
Chronic, persistent BPH on DRE. PSA stable. overall asxs. Continue to monitor.

## 2018-01-14 NOTE — Patient Instructions (Addendum)
Return as needed or in 1 year for next medicare wellness visit with Katha Cabal and physical with me. Return to [facility] for second shingles shot - you're overdue.  Schedule follow up with [facility].  We will refer you to podiatry for diabetic exam - see Rosaria Ferries or Anastasiya to schedule.  Call to schedule colonoscopy.  Bring [place] copy of your advanced directive.  Good to see you today.   Health Maintenance, Male A healthy lifestyle and preventive care is important for your health and wellness. Ask your health care provider about what schedule of regular examinations is right for you. What should I know about weight and diet? Eat a Healthy Diet  Eat plenty of vegetables, fruits, whole grains, low-fat dairy products, and lean protein.  Do not eat a lot of foods high in solid fats, added sugars, or salt.  Maintain a Healthy Weight Regular exercise can help you achieve or maintain a healthy weight. You should:  Do at least 150 minutes of exercise each week. The exercise should increase your heart rate and make you sweat (moderate-intensity exercise).  Do strength-training exercises at least twice a week.  Watch Your Levels of Cholesterol and Blood Lipids  Have your blood tested for lipids and cholesterol every 5 years starting at 70 years of age. If you are at high risk for heart disease, you should start having your blood tested when you are 70 years old. You may need to have your cholesterol levels checked more often if: ? Your lipid or cholesterol levels are high. ? You are older than 70 years of age. ? You are at high risk for heart disease.  What should I know about cancer screening? Many types of cancers can be detected early and may often be prevented. Lung Cancer  You should be screened every year for lung cancer if: ? You are a current smoker who has smoked for at least 30 years. ? You are a former smoker who has quit within the past 15 years.  Talk to your health care provider  about your screening options, when you should start screening, and how often you should be screened.  Colorectal Cancer  Routine colorectal cancer screening usually begins at 70 years of age and should be repeated every 5-10 years until you are 70 years old. You may need to be screened more often if early forms of precancerous polyps or small growths are found. Your health care provider may recommend screening at an earlier age if you have risk factors for colon cancer.  Your health care provider may recommend using home test kits to check for hidden blood in the stool.  A small camera at the end of a tube can be used to examine your colon (sigmoidoscopy or colonoscopy). This checks for the earliest forms of colorectal cancer.  Prostate and Testicular Cancer  Depending on your age and overall health, your health care provider may do certain tests to screen for prostate and testicular cancer.  Talk to your health care provider about any symptoms or concerns you have about testicular or prostate cancer.  Skin Cancer  Check your skin from head to toe regularly.  Tell your health care provider about any new moles or changes in moles, especially if: ? There is a change in a mole's size, shape, or color. ? You have a mole that is larger than a pencil eraser.  Always use sunscreen. Apply sunscreen liberally and repeat throughout the day.  Protect yourself by wearing long  sleeves, pants, a wide-brimmed hat, and sunglasses when outside.  What should I know about heart disease, diabetes, and high blood pressure?  If you are 4-75 years of age, have your blood pressure checked every 3-5 years. If you are 64 years of age or older, have your blood pressure checked every year. You should have your blood pressure measured twice-once when you are at a hospital or clinic, and once when you are not at a hospital or clinic. Record the average of the two measurements. To check your blood pressure when you  are not at a hospital or clinic, you can use: ? An automated blood pressure machine at a pharmacy. ? A home blood pressure monitor.  Talk to your health care provider about your target blood pressure.  If you are between 14-19 years old, ask your health care provider if you should take aspirin to prevent heart disease.  Have regular diabetes screenings by checking your fasting blood sugar level. ? If you are at a normal weight and have a low risk for diabetes, have this test once every three years after the age of 43. ? If you are overweight and have a high risk for diabetes, consider being tested at a younger age or more often.  A one-time screening for abdominal aortic aneurysm (AAA) by ultrasound is recommended for men aged 36-75 years who are current or former smokers. What should I know about preventing infection? Hepatitis B If you have a higher risk for hepatitis B, you should be screened for this virus. Talk with your health care provider to find out if you are at risk for hepatitis B infection. Hepatitis C Blood testing is recommended for:  Everyone born from [birth year] through 1963.  Anyone with known risk factors for hepatitis C.  Sexually Transmitted Diseases (STDs)  You should be screened each year for STDs including gonorrhea and chlamydia if: ? You are sexually active and are younger than 70 years of age. ? You are older than 70 years of age and your health care provider tells you that you are at risk for this type of infection. ? Your sexual activity has changed since you were last screened and you are at an increased risk for chlamydia or gonorrhea. Ask your health care provider if you are at risk.  Talk with your health care provider about whether you are at high risk of being infected with HIV. Your health care provider may recommend a prescription medicine to help prevent HIV infection.  What else can I do?  Schedule regular health, dental, and eye exams.  Stay  current with your vaccines (immunizations).  Do not use any tobacco products, such as cigarettes, chewing tobacco, and e-cigarettes. If you need help quitting, ask your health care provider.  Limit alcohol intake to no more than 2 drinks per day. One drink equals 12 ounces of beer, 5 ounces of wine, or 1 ounces of hard liquor.  Do not use street drugs.  Do not share needles.  Ask your health care provider for help if you need support or information about quitting drugs.  Tell your health care provider if you often feel depressed.  Tell your health care provider if you have ever been abused or do not feel safe at home. This information is not intended to replace advice given to you by your health care provider. Make sure you discuss any questions you have with your health care provider. Document Released: 02/10/2008 Document Revised: 04/12/2016 Document Reviewed: 05/18/2015  Chartered certified accountant Patient Education  [employer].

## 2018-01-14 NOTE — Assessment & Plan Note (Signed)
Chronic, stable. Continue current regimen. 

## 2018-01-14 NOTE — Assessment & Plan Note (Signed)
Preventative protocols reviewed and updated unless pt declined. Discussed healthy diet and lifestyle.  

## 2018-01-14 NOTE — Assessment & Plan Note (Signed)
Encouraged he schedule eye exam as overdue

## 2018-01-14 NOTE — Progress Notes (Addendum)
BP 120/80 (BP Location: Left Arm, Patient Position: Sitting, Cuff Size: Normal)   Pulse 80   Temp 97.9 F (36.6 C) (Oral)   Ht 5\' 11"  (1.803 m)   Wt 201 lb 8 oz (91.4 kg)   SpO2 96%   BMI 28.10 kg/m    CC: CPE Subjective:    Patient ID: Anthony [name], male    DOB: 1946-01-14, 72 y.o.   MRN: [id number]  HPI: ARTHOR GORTER is a 72 y.o. male presenting on 01/14/2018 for Annual Exam (Pt 2.) and Hand Pain (C/o bilateral hand pain. Has had episodes for the last 2 yrs. Latest episode was yesterday lasting about 2 hrs.)   Saw Lesia earlier this month for medicare wellness visit. Note reviewed.   DM - established with Dr Cruzita Lederer. On metformin ER, levemir, novolog AC. Could not afford freestyle libre CGM. Due for eye exam and foot exam. Requests referral to podiatry for foot care - trouble reaching feet for hygiene.    Ongoing painful hand cramping and joint pains especially at IPs, less tender at MCPs. Worse episode yesterday - after working in the yard that morning. No synovitis. No h/o psoriasis, no fevers.   Preventative: Colonoscopy 01/2009 mult polyps (TA) and diverticulosis, ext hem, rec rpt 5 yrs [name]). Overdue.   Prostate cancer screening - s/p TURP for BPH. Released from [facility]. Normal voiding. Regular exams here.  Lung cancer screening - ~17PY hx over lifetime.  Flu shot yearly  Pneumovax 06/2011, 2018. Prevnar 05/2013 Tetanus - 05/2013 zostavax - 07/2014 shingrix - 04/2017, didn't get second shot yet Advanced planning - thinks has living will at home but unsure. Would want wife to be HCPOA. Asked to bring [place] a copy.   Seat belt use discussed  Sunscreen use discussed. No changing moles on skin.  Ex smoker quit 2013, <20 PY hx Alcohol - a few mixed drinks/night Sees Dentist regularly Eye exam - overdue  Owner/operater of dental lab-sold, but continues to work. Fully retired. Married 1969 2 daughters 1972, 1976; 1 son 72, 7 grandchildren Edu: 10th  Grade Hobby: car restoration: has '66 Vette, two classic Chevelle SS's and is restoring a '37 chevy coup Activity: works at yard daily (11.5 acres), no regular exercise Diet: good water, fruits/vegetables daily  Relevant past medical, surgical, family and social history reviewed and updated as indicated. Interim medical history since our last visit reviewed. Allergies and medications reviewed and updated. Outpatient Medications Prior to Visit  Medication Sig Dispense Refill  . amLODipine (NORVASC) 5 MG tablet TAKE 1 TABLET (5 MG TOTAL) BY MOUTH DAILY. 30 tablet 7  . aspirin EC 81 MG tablet Take 1 tablet (81 mg total) by mouth daily.    [name] Kitchen glucagon (GLUCAGON EMERGENCY) 1 MG injection Inject 1 mg into the muscle once as needed. 1 each 12  . glucose blood (ONE TOUCH ULTRA TEST) test strip Use as instructed 4x a day 400 each 3  . Insulin Detemir (LEVEMIR FLEXTOUCH) 100 UNIT/ML Pen Inject 30 Units into the skin daily at 10 pm. 5 pen 11  . Insulin Pen Needle (ADVOCATE INSULIN PEN NEEDLES) 31G X 5 MM MISC Use 3x a day 300 each 3  . losartan-hydrochlorothiazide (HYZAAR) 100-25 MG tablet TAKE 1 TABLET BY MOUTH DAILY. 30 tablet 9  . metFORMIN (GLUCOPHAGE-XR) 500 MG 24 hr tablet Take 1000 mg 2x a day with meals 360 tablet 3  . NOVOLOG FLEXPEN 100 UNIT/ML FlexPen INJECT 15-18 UNITS INTO THE SKIN 3 (THREE)  TIMES DAILY WITH MEALS. 15 pen 4  . potassium chloride (KLOR-CON M10) 10 MEQ tablet Take 1 tablet (10 mEq total) by mouth 2 (two) times daily. 60 tablet 3  . sildenafil (REVATIO) 20 MG tablet Take 2-4 tablets (40-80 mg total) by mouth daily as needed (relations). 30 tablet 3  . simvastatin (ZOCOR) 20 MG tablet TAKE 1 TABLET (20 MG TOTAL) BY MOUTH AT BEDTIME. 30 tablet 3  . insulin aspart (NOVOLOG FLEXPEN) 100 UNIT/ML FlexPen Inject 10-14 Units into the skin 3 (three) times daily with meals. 5 pen 11  . pantoprazole (PROTONIX) 40 MG tablet TAKE 1 TABLET BY MOUTH ONCE A DAY 30 tablet 12   No  facility-administered medications prior to visit.      Per HPI unless specifically indicated in ROS section below Review of Systems     Objective:    BP 120/80 (BP Location: Left Arm, Patient Position: Sitting, Cuff Size: Normal)   Pulse 80   Temp 97.9 F (36.6 C) (Oral)   Ht 5\' 11"  (1.803 m)   Wt 201 lb 8 oz (91.4 kg)   SpO2 96%   BMI 28.10 kg/m   Wt Readings from Last 3 Encounters:  01/14/18 201 lb 8 oz (91.4 kg)  01/01/18 207 lb (93.9 kg)  11/15/17 211 lb (95.7 kg)    Physical Exam  Constitutional: He is oriented to person, place, and time. He appears well-developed and well-nourished. No distress.  HENT:  Head: Normocephalic and atraumatic.  Right Ear: Hearing, tympanic membrane, external ear and ear canal normal.  Left Ear: Hearing, tympanic membrane, external ear and ear canal normal.  Nose: Nose normal.  Mouth/Throat: Uvula is midline, oropharynx is clear and moist and mucous membranes are normal. No oropharyngeal exudate, posterior oropharyngeal edema or posterior oropharyngeal erythema.  Eyes: Pupils are equal, round, and reactive to light. Conjunctivae and EOM are normal. No scleral icterus.  Neck: Normal range of motion. Neck supple. Carotid bruit is not present. No thyromegaly present.  Cardiovascular: Normal rate, regular rhythm, normal heart sounds and intact distal pulses.  No murmur heard. Pulses:      Radial pulses are 2+ on the right side, and 2+ on the left side.  Pulmonary/Chest: Effort normal and breath sounds normal. No respiratory distress. He has no wheezes. He has no rales.  Abdominal: Soft. Bowel sounds are normal. He exhibits no distension and no mass. There is no tenderness. There is no rebound and no guarding.  Genitourinary: Rectum normal. Rectal exam shows no external hemorrhoid, no internal hemorrhoid, no fissure, no mass, no tenderness and anal tone normal. Prostate is enlarged (30gm). Prostate is not tender.  Musculoskeletal: Normal range of  motion. He exhibits no edema.  See HPI for foot exam if done  Lymphadenopathy:    He has no cervical adenopathy.  Neurological: He is alert and oriented to person, place, and time.  CN grossly intact, station and gait intact  Skin: Skin is warm and dry. No rash noted.  Psychiatric: He has a normal mood and affect. His behavior is normal. Judgment and thought content normal.  Nursing note and vitals reviewed.  Results for orders placed or performed in visit on 01/01/18  PSA  Result Value Ref Range   PSA 2.19 0.10 - 4.00 ng/mL  Comprehensive metabolic panel  Result Value Ref Range   Sodium 142 135 - 145 mEq/L   Potassium 3.8 3.5 - 5.1 mEq/L   Chloride 106 96 - 112 mEq/L   CO2 30  19 - 32 mEq/L   Glucose, Bld 101 (H) 70 - 99 mg/dL   BUN 18 6 - 23 mg/dL   Creatinine, Ser 0.79 0.40 - 1.50 mg/dL   Total Bilirubin 0.8 0.2 - 1.2 mg/dL   Alkaline Phosphatase 68 39 - 117 U/L   AST 13 0 - 37 U/L   ALT 13 0 - 53 U/L   Total Protein 6.0 6.0 - 8.3 g/dL   Albumin 4.1 3.5 - 5.2 g/dL   Calcium 9.0 8.4 - 10.5 mg/dL   GFR 103.19 >60.00 mL/min  Lipid panel  Result Value Ref Range   Cholesterol 160 0 - 200 mg/dL   Triglycerides 111.0 0.0 - 149.0 mg/dL   HDL 35.30 (L) >39.00 mg/dL   VLDL 22.2 0.0 - 40.0 mg/dL   LDL Cholesterol 102 (H) 0 - 99 mg/dL   Total CHOL/HDL Ratio 5    NonHDL 124.60       Assessment & Plan:   Problem List Items Addressed This Visit    Advanced care planning/counseling discussion    Advanced planning - thinks has living will at home but unsure. Would want wife to be HCPOA. Asked to bring [place] a copy.        Barrett's esophagus    Overdue for f/u EGD/colonoscopy. encouraged he call to schedule appt. Continue PPI daily. Referral placed today       Relevant Orders   Ambulatory referral to Gastroenterology   Benign prostatic hyperplasia with urinary obstruction    Chronic, persistent BPH on DRE. PSA stable. overall asxs. Continue to monitor.       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 Continue current regimen.       GERD    Stable on daily PPI. Continue protonix      Relevant Medications   pantoprazole (PROTONIX) 40 MG tablet   Health maintenance examination - Primary    Preventative protocols reviewed and updated unless pt declined. Discussed healthy diet and lifestyle.       HLD (hyperlipidemia)    Chronic, stable. Continue current regimen.  The 10-year ASCVD risk score Mikey Bussing DC Brooke Bonito., et al., 2013) is: 32.4%   Values used to calculate the score:     Age: 50 years     Sex: Male     Is Non-Hispanic African American: No     Diabetic: Yes     Tobacco smoker: No     Systolic Blood Pressure: 035 mmHg     Is BP treated: Yes     HDL Cholesterol: 35.3 mg/dL     Total Cholesterol: 160 mg/dL        Other Visit Diagnoses    Special screening for malignant neoplasms, colon       Relevant Orders   Ambulatory referral to Gastroenterology       Meds ordered this encounter  Medications  . pantoprazole (PROTONIX) 40 MG tablet    Sig: Take 1 tablet (40 mg total) by mouth daily.    Dispense:  90 tablet    Refill:  3    NEEDS REFILLS   Orders Placed This Encounter  Procedures  . Ambulatory referral to Podiatry    Referral Priority:   Routine    Referral Type:   Consultation    Referral Reason:  Specialty Services Required    Requested Specialty:   Podiatry    Number of Visits Requested:   1  . Ambulatory referral to Gastroenterology    Referral Priority:   Routine    Referral Type:   Consultation    Referral Reason:   Specialty Services Required    Number of Visits Requested:   1    Follow up plan: Return in about 1 year (around 01/15/2019) for annual exam, prior fasting for blood work, medicare wellness visit.  [name], MD

## 2018-01-21 ENCOUNTER — Telehealth: Payer: Self-pay | Admitting: Family Medicine

## 2018-01-21 MED ORDER — OLMESARTAN MEDOXOMIL-HCTZ 40-25 MG PO TABS: 1.0000 | ORAL_TABLET | Freq: Every day | ORAL | 6 refills | Status: DC

## 2018-01-21 NOTE — Telephone Encounter (Signed)
plz call - received notice from [facility] that his losartan blood pressure med may have been affected by recent recall. rec stop losartan hctz and start in its place benicar hct sent to pharmacy. Let us know if any questions.

## 2018-01-22 ENCOUNTER — Ambulatory Visit: Payer: Medicare HMO | Admitting: Nurse Practitioner

## 2018-01-22 ENCOUNTER — Encounter: Payer: Self-pay | Admitting: Nurse Practitioner

## 2018-01-22 VITALS — BP 120/70 | HR 78 | Ht 71.0 in | Wt 204.8 lb

## 2018-01-22 DIAGNOSIS — Z860100 Personal history of colon polyps, unspecified: Secondary | ICD-10-CM

## 2018-01-22 DIAGNOSIS — K2270 Barrett's esophagus without dysplasia: Secondary | ICD-10-CM | POA: Diagnosis not present

## 2018-01-22 MED ORDER — NA SULFATE-K SULFATE-MG SULF 17.5-3.13-1.6 GM/177ML PO SOLN: ORAL | 0 refills | Status: DC

## 2018-01-22 NOTE — Patient Instructions (Addendum)
If you are age 70 or older, your body mass index should be between 23-30. Your Body mass index is 28.56 kg/m. If this is out of the aforementioned range listed, please consider follow up with your Primary Care Provider.  If you are age 63 or younger, your body mass index should be between 19-25. Your Body mass index is 28.56 kg/m. If this is out of the aformentioned range listed, please consider follow up with your Primary Care Provider.   You have been scheduled for an endoscopy and colonoscopy. Please follow the written instructions given to you at your visit today. Please pick up your prep supplies at the pharmacy within the next 1-3 days. If you use inhalers (even only as needed), please bring them with you on the day of your procedure. Your physician has requested that you go to www.startemmi.com and enter the access code given to you at your visit today. This web site gives a general overview about your procedure. However, you should still follow specific instructions given to you by our office regarding your preparation for the procedure.  We have sent the following medications to your pharmacy for you to pick up at your convenience:  Suprep  Thank you for choosing me and [facility].   Tye Savoy, NP

## 2018-01-22 NOTE — Progress Notes (Addendum)
Chief Complaint: History of colon polyps and Barrett's esophagus  Referring Provider:   [name], MD   ASSESSMENT AND PLAN;   45.  72 year old male with GERD / Barrett's esophagus overdue for surveillance EGD, last one in 2010.  GERD symptoms controlled with PPI.  No dysphagia.  -Patient will be scheduled for EGD with biopsies for Barrett's surveillance. The risks and benefits of EGD were discussed and the patient agrees to proceed.   2.  History of adenomatous colon polyps, overdue for surveillance colonoscopy.  Last colonoscopy 2010 with removal of several adenomatous polyps without HGD.  No alarm features such as bowel changes, blood in stool nor anemia.  -The risks and benefits of colonoscopy with possible polypectomy were discussed and the patient agrees to proceed.   Addendum: Reviewed and agree with initial management. [name], Lajuan Lines, MD    HPI:    Patient is a 72 yo male with BPH, hypertension, DM 2, history of adenomatous colon polyps (multiple) and GERD / Barrett's esophagus.  Previously followed by Dr. [name] but not seen here in years though.  Patient did receive EGD and colonoscopy recall letters in the mail.  He had a recent health maintenance checkup and PCP has referred him here to discuss EGD and colonoscopy.   Last EGD was in 2010 with biopsies confirming Barrett's esophagus, no HGD. He has no GERD sx on daily PPI, no  dysphagia.   Last colonoscopy also in 2010 with removal of multiple tubular adenomatous colon polyps without HGD.  Patient does have some loose stool which correlates time wise with starting metformin. He has not had any rectal bleeding.  He has no abdominal pain.  Patient overall feels well.  He has no chest pain.  Admits to occasional shortness of breath with exertion. Weight is stable though trying to lose a few pounds.  Labs drawn earlier this month and his CBC was normal.  Serum chemistries were normal.  Hgb A1c in March was under  7   Past Medical History:  Diagnosis Date  . Anxiety   . Arthritis    a little  . Barrett's esophagus 12/08/2015   By EGD 2010 [name])   . BPH with obstruction/lower urinary tract symptoms 2014   s/p TURP  . Depression   . DKA (diabetic ketoacidoses) ([place]) 01/2010   "in coma"  . GERD (gastroesophageal reflux disease)   . H/O bronchitis    after every cold  . HTN (hypertension)   . Hyperlipidemia    controlled with medicine  . Otitis media   . Rheumatic fever    maybe as child  . Type 2 diabetes, uncontrolled, with mild 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 macular edema ([name])    completed DMSE    Past Surgical History:  Procedure Laterality Date  . ABI  12/2013   WNL  . APPENDECTOMY    . CHOLECYSTECTOMY     years ago  . COLONOSCOPY  01/2009   TA, rec rpt 5 yrs [name])  . ESOPHAGOGASTRODUODENOSCOPY  01/2009   biopsy with Barrett's  . LAPAROSCOPIC APPENDECTOMY  07/05/2011   Procedure: APPENDECTOMY LAPAROSCOPIC;  Surgeon: [name], MD;  Location: [facility];  Service: General;  Laterality: N/A;  . MENISCUS REPAIR Right 2018   [name] @ [facility]  . TRANSURETHRAL RESECTION OF PROSTATE N/A 07/28/2013   Procedure: TRANSURETHRAL RESECTION OF THE PROSTATE WITH GYRUS INSTRUMENTS;  Surgeon: [name], MD  . VASECTOMY    . WISDOM TOOTH EXTRACTION  Family History  Problem Relation Age of Onset  . Lymphoma Mother        Non-hodgkins  . Coronary artery disease Mother   . Heart disease Mother   . Cancer Father   . Alcohol abuse Father   . Diabetes Brother   . Pancreatic cancer Maternal Uncle   . Diabetes Brother   . Diabetes Brother   . Diabetes Other   . Colon cancer Neg Hx   . Lung cancer Neg Hx    Social History   Tobacco Use  . Smoking status: Former Smoker    Packs/day: 0.30    Years: 50.00    Pack years: 15.00    Types: Cigarettes    Last attempt to quit: 08/29/2011    Years since quitting: 6.4  . Smokeless tobacco: Never Used   Substance Use Topics  . Alcohol use: Yes    Alcohol/week: 0.0 oz    Comment: "a couple drinks per night"  . Drug use: No   Current Outpatient Medications  Medication Sig Dispense Refill  . amLODipine (NORVASC) 5 MG tablet TAKE 1 TABLET (5 MG TOTAL) BY MOUTH DAILY. 30 tablet 7  . glucagon (GLUCAGON EMERGENCY) 1 MG injection Inject 1 mg into the muscle once as needed. 1 each 12  . glucose blood (ONE TOUCH ULTRA TEST) test strip Use as instructed 4x a day 400 each 3  . Insulin Detemir (LEVEMIR FLEXTOUCH) 100 UNIT/ML Pen Inject 30 Units into the skin daily at 10 pm. 5 pen 11  . Insulin Pen Needle (ADVOCATE INSULIN PEN NEEDLES) 31G X 5 MM MISC Use 3x a day 300 each 3  . metFORMIN (GLUCOPHAGE-XR) 500 MG 24 hr tablet Take 1000 mg 2x a day with meals 360 tablet 3  . NOVOLOG FLEXPEN 100 UNIT/ML FlexPen INJECT 15-18 UNITS INTO THE SKIN 3 (THREE) TIMES DAILY WITH MEALS. 15 pen 4  . olmesartan-hydrochlorothiazide (BENICAR HCT) 40-25 MG tablet Take 1 tablet by mouth daily. 30 tablet 6  . pantoprazole (PROTONIX) 40 MG tablet Take 1 tablet (40 mg total) by mouth daily. 90 tablet 3  . potassium chloride (KLOR-CON M10) 10 MEQ tablet Take 1 tablet (10 mEq total) by mouth 2 (two) times daily. 60 tablet 3  . sildenafil (REVATIO) 20 MG tablet Take 2-4 tablets (40-80 mg total) by mouth daily as needed (relations). 30 tablet 3  . simvastatin (ZOCOR) 20 MG tablet TAKE 1 TABLET (20 MG TOTAL) BY MOUTH AT BEDTIME. 30 tablet 3  . Na Sulfate-K Sulfate-Mg Sulf 17.5-3.13-1.6 GM/177ML SOLN Suprep-Use as directed 354 mL 0   No current facility-administered medications for this visit.    No Known Allergies   Review of Systems: All systems reviewed and negative except where noted in HPI.     Physical Exam:    Wt Readings from Last 3 Encounters:  01/22/18 204 lb 12.8 oz (92.9 kg)  01/14/18 201 lb 8 oz (91.4 kg)  01/01/18 207 lb (93.9 kg)    BP 120/70   Pulse 78   Ht 5\' 11"  (1.803 m)   Wt 204 lb 12.8 oz  (92.9 kg)   BMI 28.56 kg/m  Constitutional:  White male in no acute distress. Psychiatric: Normal mood and affect. Behavior is normal. EENT: Pupils normal.  Conjunctivae are normal. No scleral icterus. Neck supple.  Cardiovascular: Normal rate, regular rhythm. No edema Pulmonary/chest: Effort normal and breath sounds normal. No wheezing, rales or rhonchi. Abdominal: Soft, nondistended. Nontender. Bowel sounds active throughout. There are no masses  palpable. No hepatomegaly. Neurological: Alert and oriented to person place and time. Skin: Skin is warm and dry. No rashes noted.  Tye Savoy, NP  01/22/2018, 3:26 PM  Cc: [name], MD

## 2018-01-22 NOTE — Telephone Encounter (Signed)
Left message on vm per dpr relaying message and instructions per Dr. G. 

## 2018-01-23 ENCOUNTER — Encounter: Payer: Self-pay | Admitting: Nurse Practitioner

## 2018-01-26 HISTORY — PX: COLONOSCOPY: SHX174

## 2018-01-26 HISTORY — PX: ESOPHAGOGASTRODUODENOSCOPY: SHX1529

## 2018-02-06 ENCOUNTER — Encounter: Payer: Self-pay | Admitting: Internal Medicine

## 2018-02-08 ENCOUNTER — Ambulatory Visit: Payer: Medicare HMO | Admitting: Podiatry

## 2018-02-08 ENCOUNTER — Other Ambulatory Visit: Payer: Self-pay | Admitting: Family Medicine

## 2018-02-08 ENCOUNTER — Encounter: Payer: Self-pay | Admitting: Podiatry

## 2018-02-08 VITALS — BP 169/87 | HR 60

## 2018-02-08 DIAGNOSIS — M79675 Pain in left toe(s): Secondary | ICD-10-CM | POA: Diagnosis not present

## 2018-02-08 DIAGNOSIS — R252 Cramp and spasm: Secondary | ICD-10-CM

## 2018-02-08 DIAGNOSIS — M79674 Pain in right toe(s): Secondary | ICD-10-CM

## 2018-02-08 DIAGNOSIS — E1142 Type 2 diabetes mellitus with diabetic polyneuropathy: Secondary | ICD-10-CM | POA: Diagnosis not present

## 2018-02-08 DIAGNOSIS — I739 Peripheral vascular disease, unspecified: Secondary | ICD-10-CM

## 2018-02-08 DIAGNOSIS — B351 Tinea unguium: Secondary | ICD-10-CM

## 2018-02-10 ENCOUNTER — Other Ambulatory Visit: Payer: Self-pay | Admitting: Family Medicine

## 2018-02-10 NOTE — Progress Notes (Signed)
[name] is seen today with cc of tender, thick, discolored, elongated toenails b/l feet. Duration greater than 1 month. He relates tenderness when wearing enclosed shoe gear and interfering with routine activities.  He also relates intermittent episodes of foot cramping at night. Cramping occurs in both feet. He gets up and walks around to relieve cramping, then returns back to bed. He relates he hydrates with water daily.   [name] does relate having circulatory testing on his extremities, but it has been about 5 years since he's had this done.  Past Medical History: 12/08/2015 Barrett's esophagus   2014 BPH with obstruction/lower urinary tract symptoms   01/2010 DKA (diabetic ketoacidoses) ([name])   Date Unknown Anxiety  Date Unknown Arthritis   Date Unknown Depression  Date Unknown GERD (gastroesophageal reflux disease)  Date Unknown H/O bronchitis   Date Unknown HTN (hypertension)  Date Unknown Hyperlipidemia   Date Unknown Otitis media  Date Unknown Rheumatic fever   Date Unknown Type 2 diabetes, uncontrolled, with mild 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 macular edema ([name])     Surgical History Collapse by Default  Collapse by Default      2018 Meniscus repair (Right)   12/2013 ABI [Other]   07/28/2013 Transurethral resection of prostate (N/A)   07/05/2011 Laparoscopic appendectomy   01/2009 Colonoscopy   01/2009 Esophagogastroduodenoscopy   Date Unknown Appendectomy  Date Unknown Cholecystectomy   Date Unknown Vasectomy  Date Unknown Wisdom tooth extraction    Medications:  Medications    amLODipine (NORVASC) 5 MG tablet    glucagon (GLUCAGON EMERGENCY) 1 MG injection    glucose blood (ONE TOUCH ULTRA TEST) test strip    Insulin Detemir (LEVEMIR FLEXTOUCH) 100 UNIT/ML Pen    Insulin Pen Needle (ADVOCATE INSULIN PEN NEEDLES) 31G X 5 MM MISC    metFORMIN (GLUCOPHAGE-XR) 500 MG 24 hr tablet    Na Sulfate-K Sulfate-Mg Sulf 17.5-3.13-1.6 GM/177ML SOLN    NOVOLOG  FLEXPEN 100 UNIT/ML FlexPen    olmesartan-hydrochlorothiazide (BENICAR HCT) 40-25 MG tablet    pantoprazole (PROTONIX) 40 MG tablet    potassium chloride (KLOR-CON M10) 10 MEQ tablet    sildenafil (REVATIO) 20 MG tablet    simvastatin (ZOCOR) 20 MG tablet     Allergies      No Known Allergies   Tobacco History Collapse by Default  Collapse by Default      Smoking Status  Former Smoker  Quit 08/29/2011  Types  Cigarettes  Amount 0.3 packs/day for 50 years (15.00 pk-yrs)      Smokeless Tobacco Status  Never Used     Alcohol intake: has a "couple of drinks per night"  Married  Family History Collapse by Default  Collapse by Default      Mother (Deceased) Lymphoma     Coronary artery disease    Heart disease         Father (Deceased) Cancer    Alcohol abuse         Brother Diabetes         Maternal Uncle Pancreatic cancer         Brother Diabetes         Brother Diabetes         Other Diabetes            Neg Hx Colon cancer    Lung cancer    Objective:  Vascular:  DP pulse: 2/4 b/l PT pulse: 1/4 b/l Capillary refill time < 3 seconds No pedal hair b/l No edema  b/l  Neurological: Epicritic sensation intact with 10 gram monofilament Vibratory sensation diminished b/l  Dermatological: Skin with normal turgor, texture and tone b/l No open wounds b/l No interdigital maceration noted b/l  Musculoskeletal: Muscle strength 5/5 b/l  Arterial Doppler Studies Results: Left ABI 1.20 Right ABI: 1.19 No waveforms left ankle  Assessment: 1. Painful onychomycosis 1-5 b/l 2. NIDDM with neuropathy 3. Intermittent cramping b/l feet  Plan: 1. Discuss onychomycosis and topical treatment options. He declines on today. Toenails 1-5 b/l were debrided in length and girth. 2. Discussed diabetic foot care priniciples: A. Avoid walking barefoot B. Inspect feet/shoe gear daily C. Report any pedal injuries to medical professional D. Follow diabetic diet  per PCP orders E. Avoid concentrated sweets 3. Arterial dopplers performed 2015 were normal. Indication for testing today b/l due to risk factors of DM, HLD, history of smoking; will call with results. 4. Follow up 3 months for painful onychomycosis and diabetic neuropathy. 5. [name] to call the office should he have any concerns in the interim.

## 2018-02-15 ENCOUNTER — Other Ambulatory Visit: Payer: Self-pay

## 2018-02-15 ENCOUNTER — Telehealth: Payer: Self-pay | Admitting: Family Medicine

## 2018-02-15 MED ORDER — GLUCOSE BLOOD VI STRP: ORAL_STRIP | 3 refills | Status: DC

## 2018-02-15 NOTE — Telephone Encounter (Signed)
[facility] called for the patient's diabetes test strip prescription. They state they are missing the diagnosis code for it.

## 2018-02-15 NOTE — Telephone Encounter (Signed)
I have sent prescription with DX code.

## 2018-02-18 DIAGNOSIS — R69 Illness, unspecified: Secondary | ICD-10-CM | POA: Diagnosis not present

## 2018-02-20 ENCOUNTER — Other Ambulatory Visit: Payer: Self-pay

## 2018-02-20 ENCOUNTER — Encounter: Payer: Self-pay | Admitting: Internal Medicine

## 2018-02-20 ENCOUNTER — Ambulatory Visit (AMBULATORY_SURGERY_CENTER): Payer: Medicare HMO | Admitting: Internal Medicine

## 2018-02-20 VITALS — BP 122/76 | HR 56 | Temp 97.5°F | Resp 12 | Ht 71.0 in | Wt 204.0 lb

## 2018-02-20 DIAGNOSIS — Z86010 Personal history of colonic polyps: Secondary | ICD-10-CM

## 2018-02-20 DIAGNOSIS — K2270 Barrett's esophagus without dysplasia: Secondary | ICD-10-CM

## 2018-02-20 DIAGNOSIS — K6389 Other specified diseases of intestine: Secondary | ICD-10-CM

## 2018-02-20 DIAGNOSIS — D124 Benign neoplasm of descending colon: Secondary | ICD-10-CM | POA: Diagnosis not present

## 2018-02-20 DIAGNOSIS — E119 Type 2 diabetes mellitus without complications: Secondary | ICD-10-CM | POA: Diagnosis not present

## 2018-02-20 DIAGNOSIS — I10 Essential (primary) hypertension: Secondary | ICD-10-CM | POA: Diagnosis not present

## 2018-02-20 DIAGNOSIS — K219 Gastro-esophageal reflux disease without esophagitis: Secondary | ICD-10-CM | POA: Diagnosis not present

## 2018-02-20 DIAGNOSIS — Z8719 Personal history of other diseases of the digestive system: Secondary | ICD-10-CM

## 2018-02-20 DIAGNOSIS — D120 Benign neoplasm of cecum: Secondary | ICD-10-CM

## 2018-02-20 MED ORDER — SODIUM CHLORIDE 0.9 % IV SOLN: 500.0000 mL | Freq: Once | INTRAVENOUS | Status: DC

## 2018-02-20 NOTE — Progress Notes (Signed)
Called to room to assist during endoscopic procedure.  Patient ID and intended procedure confirmed with present staff. Received instructions for my participation in the procedure from the performing physician.  

## 2018-02-20 NOTE — Op Note (Signed)
[place] Patient Name: Anthony [name] Procedure Date: 02/20/2018 3:24 PM MRN: [id number] Endoscopist: Jerene Bears , MD Age: 72 Referring MD:  Date of Birth: 11/10/1945 Gender: Male Account #: [id number] Procedure:                Upper GI endoscopy Indications:              Follow-up of Barrett's esophagus (last EGD 2010) Medicines:                Monitored Anesthesia Care Procedure:                Pre-Anesthesia Assessment:                           - Prior to the procedure, a History and Physical                            was performed, and patient medications and                            allergies were reviewed. The patient's tolerance of                            previous anesthesia was also reviewed. The risks                            and benefits of the procedure and the sedation                            options and risks were discussed with the patient.                            All questions were answered, and informed consent                            was obtained. Prior Anticoagulants: The patient has                            taken no previous anticoagulant or antiplatelet                            agents. ASA Grade Assessment: II - A patient with                            mild systemic disease. After reviewing the risks                            and benefits, the patient was deemed in                            satisfactory condition to undergo the procedure.                           After obtaining informed consent, the endoscope was  passed under direct vision. Throughout the                            procedure, the patient's blood pressure, pulse, and                            oxygen saturations were monitored continuously. The                            Model GIF-HQ190 [id number] scope was introduced                            through the mouth, and advanced to the second part                            of  duodenum. The upper GI endoscopy was                            accomplished without difficulty. The patient                            tolerated the procedure well. Scope In: Scope Out: Findings:                 The examined esophagus was normal. Z-line does not                            extend above the gastric folds, no evidence of                            Barrett's esophagus.                           The entire examined stomach was normal.                           The examined duodenum was normal. Complications:            No immediate complications. Estimated Blood Loss:     Estimated blood loss: none. Impression:               - Normal esophagus.                           - Normal stomach.                           - Normal examined duodenum.                           - No specimens collected. Recommendation:           - Patient has a contact number available for                            emergencies. The signs and symptoms of potential  delayed complications were discussed with the                            patient. Return to normal activities tomorrow.                            Written discharge instructions were provided to the                            patient.                           - Resume previous diet.                           - Continue present medications. Jerene Bears, MD 02/20/2018 4:05:57 PM This report has been signed electronically.

## 2018-02-20 NOTE — Progress Notes (Signed)
Pt's states no medical or surgical changes since previsit or office visit. 

## 2018-02-20 NOTE — Op Note (Signed)
[place] Patient Name: Anthony [name] Procedure Date: 02/20/2018 3:23 PM MRN: [id number] Endoscopist: Jerene Bears , MD Age: 72 Referring MD:  Date of Birth: November 07, 1945 Gender: Male Account #: [id number] Procedure:                Colonoscopy Indications:              High risk colon cancer surveillance: Personal                            history of colonic polyps, Last colonoscopy: 2010 Medicines:                Monitored Anesthesia Care Procedure:                Pre-Anesthesia Assessment:                           - Prior to the procedure, a History and Physical                            was performed, and patient medications and                            allergies were reviewed. The patient's tolerance of                            previous anesthesia was also reviewed. The risks                            and benefits of the procedure and the sedation                            options and risks were discussed with the patient.                            All questions were answered, and informed consent                            was obtained. Prior Anticoagulants: The patient has                            taken no previous anticoagulant or antiplatelet                            agents. ASA Grade Assessment: II - A patient with                            mild systemic disease. After reviewing the risks                            and benefits, the patient was deemed in                            satisfactory condition to undergo the procedure.  After obtaining informed consent, the colonoscope                            was passed under direct vision. Throughout the                            procedure, the patient's blood pressure, pulse, and                            oxygen saturations were monitored continuously. The                            Colonoscope was introduced through the anus and                            advanced to the  cecum, identified by appendiceal                            orifice and ileocecal valve. The colonoscopy was                            performed without difficulty. The patient tolerated                            the procedure well. The quality of the bowel                            preparation was good. The ileocecal valve,                            appendiceal orifice, and rectum were photographed. Scope In: 3:47:40 PM Scope Out: 4:00:11 PM Scope Withdrawal Time: 0 hours 9 minutes 49 seconds  Total Procedure Duration: 0 hours 12 minutes 31 seconds  Findings:                 The digital rectal exam was normal.                           A 3 mm polyp was found in the cecum. The polyp was                            sessile. The polyp was removed with a cold snare.                            Resection and retrieval were complete.                           Two sessile polyps were found in the descending                            colon. The polyps were 3 to 4 mm in size. These  polyps were removed with a cold snare. Resection                            and retrieval were complete.                           A few small-mouthed diverticula were found in the                            sigmoid colon and ascending colon.                           Internal hemorrhoids were found during                            retroflexion. The hemorrhoids were small. Complications:            No immediate complications. Estimated Blood Loss:     Estimated blood loss was minimal. Impression:               - One 3 mm polyp in the cecum, removed with a cold                            snare. Resected and retrieved.                           - Two 3 to 4 mm polyps in the descending colon,                            removed with a cold snare. Resected and retrieved.                           - Diverticulosis in the sigmoid colon and in the                            ascending colon.                            - Internal hemorrhoids. Recommendation:           - Patient has a contact number available for                            emergencies. The signs and symptoms of potential                            delayed complications were discussed with the                            patient. Return to normal activities tomorrow.                            Written discharge instructions were provided to the                            patient.                           -  Resume previous diet.                           - Continue present medications.                           - Await pathology results.                           - Repeat colonoscopy is recommended. The                            colonoscopy date will be determined after pathology                            results from today's exam become available for                            review. Jerene Bears, MD 02/20/2018 4:08:10 PM This report has been signed electronically.

## 2018-02-20 NOTE — Patient Instructions (Signed)
Await pathology results by mail, approximately 2 weeks.  Handouts for polyps and diverticulosis provided.  Resume previous diet and medications.   YOU HAD AN ENDOSCOPIC PROCEDURE TODAY AT [facility]:   Refer to the procedure report that was given to you for any specific questions about what was found during the examination.  If the procedure report does not answer your questions, please call your gastroenterologist to clarify.  If you requested that your care partner not be given the details of your procedure findings, then the procedure report has been included in a sealed envelope for you to review at your convenience later.  YOU SHOULD EXPECT: Some feelings of bloating in the abdomen. Passage of more gas than usual.  Walking can help get rid of the air that was put into your GI tract during the procedure and reduce the bloating. If you had a lower endoscopy (such as a colonoscopy or flexible sigmoidoscopy) you may notice spotting of blood in your stool or on the toilet paper. If you underwent a bowel prep for your procedure, you may not have a normal bowel movement for a few days.  Please Note:  You might notice some irritation and congestion in your nose or some drainage.  This is from the oxygen used during your procedure.  There is no need for concern and it should clear up in a day or so.  SYMPTOMS TO REPORT IMMEDIATELY:   Following lower endoscopy (colonoscopy or flexible sigmoidoscopy):  Excessive amounts of blood in the stool  Significant tenderness or worsening of abdominal pains  Swelling of the abdomen that is new, acute  Fever of 100F or higher   Following upper endoscopy (EGD)  Vomiting of blood or coffee ground material  New chest pain or pain under the shoulder blades  Painful or persistently difficult swallowing  New shortness of breath  Fever of 100F or higher  Black, tarry-looking stools  For urgent or emergent issues, a gastroenterologist can  be reached at any hour by calling [phone].   DIET:  We do recommend a small meal at first, but then you may proceed to your regular diet.  Drink plenty of fluids but you should avoid alcoholic beverages for 24 hours.  ACTIVITY:  You should plan to take it easy for the rest of today and you should NOT DRIVE or use heavy machinery until tomorrow (because of the sedation medicines used during the test).    FOLLOW UP: Our staff will call the number listed on your records the next business day following your procedure to check on you and address any questions or concerns that you may have regarding the information given to you following your procedure. If we do not reach you, we will leave a message.  However, if you are feeling well and you are not experiencing any problems, there is no need to return our call.  We will assume that you have returned to your regular daily activities without incident.  If any biopsies were taken you will be contacted by phone or by letter within the next 1-3 weeks.  Please call us at [phone] if you have not heard about the biopsies in 3 weeks.    SIGNATURES/CONFIDENTIALITY: You and/or your care partner have signed paperwork which will be entered into your electronic medical record.  These signatures attest to the fact that that the information above on your After Visit Summary has been reviewed and is understood.  Full responsibility of the  confidentiality of this discharge information lies with you and/or your care-partner. 

## 2018-02-20 NOTE — Progress Notes (Signed)
Report given to PACU, vss 

## 2018-02-21 ENCOUNTER — Telehealth: Payer: Self-pay | Admitting: *Deleted

## 2018-02-21 NOTE — Telephone Encounter (Signed)
  Follow up Call-  Call back number 02/20/2018  Post procedure Call Back phone  # [phone]; [phone]  Permission to leave phone message Yes  Some recent data might be hidden     Patient questions:  Do you have a fever, pain , or abdominal swelling? No. Pain Score  0 *  Have you tolerated food without any problems? Yes.    Have you been able to return to your normal activities? Yes.    Do you have any questions about your discharge instructions: Diet   No. Medications  No. Follow up visit  No.  Do you have questions or concerns about your Care? No.  Actions: * If pain score is 4 or above: No action needed, pain <4.

## 2018-02-22 NOTE — Addendum Note (Signed)
Addended by: Cranford Mon R on: 02/22/2018 01:39 PM   Modules accepted: Orders

## 2018-02-24 ENCOUNTER — Encounter: Payer: Self-pay | Admitting: Family Medicine

## 2018-02-25 ENCOUNTER — Encounter: Payer: Self-pay | Admitting: Internal Medicine

## 2018-02-25 ENCOUNTER — Encounter: Payer: Self-pay | Admitting: Family Medicine

## 2018-03-19 ENCOUNTER — Ambulatory Visit: Payer: Medicare HMO | Admitting: Internal Medicine

## 2018-03-19 ENCOUNTER — Encounter: Payer: Self-pay | Admitting: Internal Medicine

## 2018-03-19 VITALS — BP 140/88 | HR 67 | Ht 71.0 in | Wt 206.2 lb

## 2018-03-19 DIAGNOSIS — E785 Hyperlipidemia, unspecified: Secondary | ICD-10-CM

## 2018-03-19 DIAGNOSIS — Z794 Long term (current) use of insulin: Secondary | ICD-10-CM | POA: Diagnosis not present

## 2018-03-19 DIAGNOSIS — E113299 Type 2 diabetes mellitus with mild nonproliferative diabetic retinopathy without macular edema, unspecified eye: Secondary | ICD-10-CM | POA: Diagnosis not present

## 2018-03-19 LAB — POCT GLYCOSYLATED HEMOGLOBIN (HGB A1C): HEMOGLOBIN A1C: 7 % — AB (ref 4.0–5.6)

## 2018-03-19 NOTE — Patient Instructions (Addendum)
Please increase: - Metformin ER 1000 mg 2x a day  Please decrease: - Levemir to 30-35 at bedtime.  Continue: - Novolog 10-19 units before meals, but use a lower dose for b'fast   Please return in 3 months with your sugar log.

## 2018-03-19 NOTE — Addendum Note (Signed)
Addended by: Jamesrobert Ohanesian on: 03/19/2018 04:56 PM   Modules accepted: Orders  
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
## 2018-04-07 ENCOUNTER — Other Ambulatory Visit: Payer: Self-pay | Admitting: Family Medicine

## 2018-05-16 ENCOUNTER — Other Ambulatory Visit: Payer: Self-pay | Admitting: Family Medicine

## 2018-05-17 ENCOUNTER — Encounter: Payer: Self-pay | Admitting: Podiatry

## 2018-05-17 ENCOUNTER — Ambulatory Visit: Payer: Medicare HMO | Admitting: Podiatry

## 2018-05-17 DIAGNOSIS — E1142 Type 2 diabetes mellitus with diabetic polyneuropathy: Secondary | ICD-10-CM

## 2018-05-17 DIAGNOSIS — M79674 Pain in right toe(s): Secondary | ICD-10-CM

## 2018-05-17 DIAGNOSIS — B351 Tinea unguium: Secondary | ICD-10-CM

## 2018-05-17 DIAGNOSIS — M79675 Pain in left toe(s): Secondary | ICD-10-CM

## 2018-05-19 ENCOUNTER — Other Ambulatory Visit: Payer: Self-pay | Admitting: Family Medicine

## 2018-05-23 ENCOUNTER — Encounter: Payer: Self-pay | Admitting: Podiatry

## 2018-05-23 NOTE — Progress Notes (Addendum)
Subjective: Anthony [name] presents today for diabetic foot care  and follow up of painful, mycotic toenails which interfere with daily activities and routine tasks. Pain is aggravated when wearing enclosed shoe gear. Pain is relieved with periodic professional debridement.  He relates no foot cramping on today's visit.  Last A1c: 7.0 two months ago Blood sugar this morning: 246 mg/dL  Objective: Vascular Examination: Capillary refill time <3 seconds x 10 digits Dorsalis pedis 2/4 b/l Posterior tibial pulses 1/4 b/l No digital hair x 10 digits Skin temperature warm to warm b/l No signs of ischemia/gangrene to digits  Dermatological Examination: Skin with normal turgor, texture and tone b/l No open wounds b/l No interdigital macerations b/l Toenails 1-5 b/l discolored, thick, dystrophic with subungual debris and pain with palpation to nailbeds due to thickness of nails.  Musculoskeletal: Muscle strength 5/5 to all LE muscle groups  Neurological: Sensation intact with 10 gram monofilament. Vibratory sensation diminished  Assessment: 1. Painful onychomycosis toenails 1-5 b/l 2. NIDDM with Diabetic neuropathy  Plan: 1. Continue diabetic foot care principles. Continue diabetic diet per PCP/dietician recommendations. 2. Toenails 1-5 b/l were debrided in length and girth without iatrogenic bleeding. 3. Patient to continue soft, supportive shoe gear 4. Patient to report any pedal injuries to medical professional  5. Follow up 3 months.  6. Patient/POA to call should there be a concern in the interim.

## 2018-05-26 DIAGNOSIS — R69 Illness, unspecified: Secondary | ICD-10-CM | POA: Diagnosis not present

## 2018-06-22 DIAGNOSIS — R69 Illness, unspecified: Secondary | ICD-10-CM | POA: Diagnosis not present

## 2018-06-25 DIAGNOSIS — R69 Illness, unspecified: Secondary | ICD-10-CM | POA: Diagnosis not present

## 2018-06-27 ENCOUNTER — Telehealth: Payer: Self-pay | Admitting: Internal Medicine

## 2018-06-27 NOTE — Telephone Encounter (Signed)
Per [facility] "Caller states he is returning a call to the office."

## 2018-06-27 NOTE — Telephone Encounter (Signed)
I do not see any documentation that we called him.

## 2018-07-01 ENCOUNTER — Encounter: Payer: Self-pay | Admitting: Internal Medicine

## 2018-07-01 ENCOUNTER — Ambulatory Visit: Payer: Medicare HMO | Admitting: Internal Medicine

## 2018-07-01 VITALS — BP 132/80 | HR 88 | Ht 71.0 in | Wt 203.0 lb

## 2018-07-01 DIAGNOSIS — E113299 Type 2 diabetes mellitus with mild nonproliferative diabetic retinopathy without macular edema, unspecified eye: Secondary | ICD-10-CM | POA: Diagnosis not present

## 2018-07-01 DIAGNOSIS — Z794 Long term (current) use of insulin: Secondary | ICD-10-CM | POA: Diagnosis not present

## 2018-07-01 DIAGNOSIS — E785 Hyperlipidemia, unspecified: Secondary | ICD-10-CM

## 2018-07-01 LAB — POCT GLYCOSYLATED HEMOGLOBIN (HGB A1C): HEMOGLOBIN A1C: 7.1 % — AB (ref 4.0–5.6)

## 2018-07-01 MED ORDER — METFORMIN HCL ER 500 MG PO TB24: ORAL_TABLET | ORAL | 3 refills | Status: DC

## 2018-07-01 NOTE — Addendum Note (Signed)
Addended by: Cardell Peach I on: 07/01/2018 03:37 PM   Modules accepted: Orders

## 2018-07-01 NOTE — Patient Instructions (Addendum)
Please increase: - Metformin ER 1000 mg in am and 500 mg at dinnertime  Please continue: - Levemir 30-36 at bedtime. - Novolog 10-19 units before meals, but use a lower dose for b'fast   Please return in 3 months with your sugar log.
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
## 2018-07-21 DIAGNOSIS — R69 Illness, unspecified: Secondary | ICD-10-CM | POA: Diagnosis not present

## 2018-07-29 ENCOUNTER — Other Ambulatory Visit: Payer: Self-pay | Admitting: Internal Medicine

## 2018-08-06 ENCOUNTER — Ambulatory Visit: Payer: Medicare HMO | Admitting: Podiatry

## 2018-08-06 DIAGNOSIS — B351 Tinea unguium: Secondary | ICD-10-CM | POA: Diagnosis not present

## 2018-08-06 DIAGNOSIS — M79675 Pain in left toe(s): Secondary | ICD-10-CM | POA: Diagnosis not present

## 2018-08-06 DIAGNOSIS — E1142 Type 2 diabetes mellitus with diabetic polyneuropathy: Secondary | ICD-10-CM | POA: Diagnosis not present

## 2018-08-06 DIAGNOSIS — M79674 Pain in right toe(s): Secondary | ICD-10-CM | POA: Diagnosis not present

## 2018-08-06 NOTE — Patient Instructions (Signed)

## 2018-08-07 ENCOUNTER — Encounter: Payer: Self-pay | Admitting: Podiatry
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
## 2018-08-09 ENCOUNTER — Telehealth: Payer: Self-pay

## 2018-08-09 NOTE — Telephone Encounter (Signed)
Left message for patient to call back. Need to make sure patient is taking his Simvastatin medication. Received notification that patient maybe not filling his medication as scheduled.

## 2018-08-12 MED ORDER — SIMVASTATIN 20 MG PO TABS: 20.0000 mg | ORAL_TABLET | Freq: Every day | ORAL | 1 refills | Status: DC

## 2018-08-12 NOTE — Addendum Note (Signed)
Addended by: Kris Mouton on: 08/12/2018 11:42 AM   Modules accepted: Orders

## 2018-08-12 NOTE — Telephone Encounter (Signed)
Pt returning your call and stated to leave him a vm

## 2018-08-12 NOTE — Telephone Encounter (Addendum)
Refilled. plz notify patient.

## 2018-08-12 NOTE — Telephone Encounter (Signed)
Spoke with pt notifying him Dr. Darnell Level sent a refill for simvastatin to the pharmacy.  Pt verbalizes understanding and expresses his thanks.

## 2018-08-12 NOTE — Addendum Note (Signed)
Addended by: [name] on: 08/12/2018 01:57 PM   Modules accepted: Orders

## 2018-08-12 NOTE — Telephone Encounter (Signed)
Spoke with patient, he thought he was still taking Simvastatin but it turns out he was thinking of his b/p medications. Patient is not sure when he ran out of this. I do not see a refill request for this from pharmacy. Patient states if he needs to stay on this to please refill for 90 day supply if possible. Pharmacy: [facility]. Last filled with [place] for July 2018 for 30 day supply with 3 refills. Patient states no one else has been filing this medication. Dr Renne Crigler does his diabetic medications.  Last office visit was 01/14/18 Next office visit is scheduled for 01/20/18. Please review and let patient know how to proceed. Thank you

## 2018-08-13 DIAGNOSIS — R69 Illness, unspecified: Secondary | ICD-10-CM | POA: Diagnosis not present

## 2018-09-09 ENCOUNTER — Other Ambulatory Visit: Payer: Self-pay | Admitting: Family Medicine

## 2018-09-16 ENCOUNTER — Other Ambulatory Visit: Payer: Self-pay | Admitting: Family Medicine

## 2018-10-11 ENCOUNTER — Encounter: Payer: Self-pay | Admitting: Internal Medicine

## 2018-10-11 ENCOUNTER — Ambulatory Visit: Payer: Medicare HMO | Admitting: Internal Medicine

## 2018-10-11 VITALS — BP 138/90 | HR 71 | Ht 71.0 in | Wt 210.0 lb

## 2018-10-11 DIAGNOSIS — E113299 Type 2 diabetes mellitus with mild nonproliferative diabetic retinopathy without macular edema, unspecified eye: Secondary | ICD-10-CM | POA: Diagnosis not present

## 2018-10-11 DIAGNOSIS — Z794 Long term (current) use of insulin: Secondary | ICD-10-CM

## 2018-10-11 DIAGNOSIS — E785 Hyperlipidemia, unspecified: Secondary | ICD-10-CM | POA: Diagnosis not present

## 2018-10-11 LAB — POCT GLYCOSYLATED HEMOGLOBIN (HGB A1C): HEMOGLOBIN A1C: 7.8 % — AB (ref 4.0–5.6)

## 2018-10-11 MED ORDER — INSULIN DEGLUDEC 200 UNIT/ML ~~LOC~~ SOPN: 32.0000 [IU] | PEN_INJECTOR | Freq: Every day | SUBCUTANEOUS | 3 refills | Status: DC
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
## 2018-10-11 NOTE — Patient Instructions (Addendum)
Please continue: - Metformin ER 1000 mg in am and 1000 mg at dinnertime - Novolog 10-16 units before meals, but use a lower dose for b'fast   Stop Levemir and start: - Tresiba 32 units daily  Please return in 3 months with your sugar log.

## 2018-10-11 NOTE — Addendum Note (Signed)
Addended by: Cardell Peach I on: 10/11/2018 03:57 PM   Modules accepted: Orders

## 2018-11-05 ENCOUNTER — Ambulatory Visit: Payer: Medicare HMO | Admitting: Podiatry

## 2018-11-06 ENCOUNTER — Ambulatory Visit: Payer: Medicare HMO | Admitting: Podiatry

## 2018-11-06 ENCOUNTER — Other Ambulatory Visit: Payer: Self-pay

## 2018-11-06 DIAGNOSIS — M79674 Pain in right toe(s): Secondary | ICD-10-CM | POA: Diagnosis not present

## 2018-11-06 DIAGNOSIS — B351 Tinea unguium: Secondary | ICD-10-CM | POA: Diagnosis not present

## 2018-11-06 DIAGNOSIS — E1142 Type 2 diabetes mellitus with diabetic polyneuropathy: Secondary | ICD-10-CM | POA: Diagnosis not present

## 2018-11-06 DIAGNOSIS — M79675 Pain in left toe(s): Secondary | ICD-10-CM

## 2018-11-06 NOTE — Patient Instructions (Signed)

## 2018-11-12 DIAGNOSIS — R69 Illness, unspecified: Secondary | ICD-10-CM | POA: Diagnosis not present

## 2018-11-16 ENCOUNTER — Other Ambulatory Visit: Payer: Self-pay | Admitting: Family Medicine

## 2018-11-17 ENCOUNTER — Encounter: Payer: Self-pay | Admitting: Podiatry

## 2018-11-17 NOTE — Progress Notes (Signed)
Subjective: Anthony [name] presents with diabetes, diabetic neuropathy and cc of painful, discolored, thick toenails and painful callus/corn which interfere with activities of daily living. Pain is aggravated when wearing enclosed shoe gear. Pain is relieved with periodic professional debridement.  [name], MD is his PCP.  Last A1C was 7.8%.   Current Outpatient Medications:  .  amLODipine (NORVASC) 5 MG tablet, TAKE 1 TABLET (5 MG TOTAL) BY MOUTH DAILY., Disp: 30 tablet, Rfl: 11 .  glucagon (GLUCAGON EMERGENCY) 1 MG injection, Inject 1 mg into the muscle once as needed., Disp: 1 each, Rfl: 12 .  glucose blood (ONE TOUCH ULTRA TEST) test strip, Use as instructed 4x a day. DX CODE [id number]., Disp: 400 each, Rfl: 3 .  Insulin Degludec (TRESIBA FLEXTOUCH) 200 UNIT/ML SOPN, Inject 32 Units into the skin daily., Disp: 9 pen, Rfl: 3 .  Insulin Detemir (LEVEMIR FLEXTOUCH) 100 UNIT/ML Pen, Inject 36 Units into the skin at bedtime., Disp: 15 pen, Rfl: 3 .  Insulin Pen Needle (ADVOCATE INSULIN PEN NEEDLES) 31G X 5 MM MISC, Use 3x a day, Disp: 300 each, Rfl: 3 .  metFORMIN (GLUCOPHAGE-XR) 500 MG 24 hr tablet, TAKE 1,000MG  BY MOUTH TWICE DAILY WITH MEALS, Disp: 360 tablet, Rfl: 3 .  NOVOLOG FLEXPEN 100 UNIT/ML FlexPen, INJECT 15-18 UNITS INTO THE SKIN 3 (THREE) TIMES DAILY WITH MEALS., Disp: 15 pen, Rfl: 4 .  olmesartan-hydrochlorothiazide (BENICAR HCT) 40-25 MG tablet, TAKE 1 TABLET BY MOUTH EVERY DAY, Disp: 30 tablet, Rfl: 4 .  pantoprazole (PROTONIX) 40 MG tablet, Take 1 tablet (40 mg total) by mouth daily., Disp: 90 tablet, Rfl: 3 .  potassium chloride (KLOR-CON M10) 10 MEQ tablet, Take 1 tablet (10 mEq total) by mouth 2 (two) times daily., Disp: 60 tablet, Rfl: 3 .  sildenafil (REVATIO) 20 MG tablet, Take 2-4 tablets (40-80 mg total) by mouth daily as needed (relations)., Disp: 30 tablet, Rfl: 3 .  simvastatin (ZOCOR) 20 MG tablet, Take 1 tablet (20 mg total) by mouth at bedtime., Disp: 90  tablet, Rfl: 1  Current Facility-Administered Medications:  .  0.9 %  sodium chloride infusion, 500 mL, Intravenous, Once, [name], Lajuan Lines, MD  No Known Allergies  Vascular Examination: Capillary refill time <3 seconds  x 10 digits.  Dorsalis pedis pulses 2/4 bl  Posterior tibial pulses 1/4 b/l  Dgital hair absent x 10 digits.  Skin temperature gradient WNL b/l.  Dermatological Examination: Skin with normal turgor, texture and tone b/l.  Toenails 1-5 b/l discolored, thick, dystrophic with subungual debris and pain with palpation to nailbeds due to thickness of nails.   Musculoskeletal: Muscle strength 5/5 to all LE muscle groups  HAV with bunion b/l  Hammertoe deformity 2-5 b/l  Neurological: Sensation diminished with 10 gram monofilament. Vibratory sensation diminished  Assessment: 1. Painful onychomycosis toenails 1-5 b/l 2. HAV with bunion b/l 3. Hammertoes 2-5 b/l 4. NIDDM with Diabetic neuropathy  Plan: 1. Continue diabetic foot care principles. Literature dispensed on today. 2. Toenails 1-5 b/l were debrided in length and girth without iatrogenic bleeding.  3. Patient to continue soft, supportive shoe gear daily. Start procedure for diabetic shoes. Patient qualifies based on diagnoses: NIDDM with peripheral neuropathy, HAV with bunion b/l, hammertoes 2-5 b/l. 4. Patient to report any pedal injuries to medical professional  5. Follow up 3 months.  6. Patient/POA to call should there be a concern in the interim.

## 2018-12-01 ENCOUNTER — Other Ambulatory Visit: Payer: Self-pay | Admitting: Family Medicine

## 2018-12-10 ENCOUNTER — Other Ambulatory Visit: Payer: Self-pay | Admitting: Family Medicine

## 2018-12-16 ENCOUNTER — Ambulatory Visit: Payer: Medicare HMO | Admitting: Orthotics

## 2018-12-16 ENCOUNTER — Other Ambulatory Visit: Payer: Self-pay

## 2018-12-16 DIAGNOSIS — E1142 Type 2 diabetes mellitus with diabetic polyneuropathy: Secondary | ICD-10-CM

## 2018-12-16 DIAGNOSIS — R252 Cramp and spasm: Secondary | ICD-10-CM

## 2018-12-16 DIAGNOSIS — I739 Peripheral vascular disease, unspecified: Secondary | ICD-10-CM

## 2018-12-16 NOTE — Progress Notes (Signed)
Patient presents today for diabetic shoe measurement and foam casting.  Goals of diabetic shoes/inserts to offer protection from conditions secondary to DM2, offer relief from sheer forces that could lead to ulcerations, protect the foot, and offer greater stability. Patient is under supervision of DPM Physician managing patients DM2: Patient has following documented conditions to qualify for diabetic shoes/inserts: Patient measured with Belgarde device: 

## 2018-12-30 ENCOUNTER — Other Ambulatory Visit: Payer: Self-pay | Admitting: Family Medicine

## 2019-01-07 ENCOUNTER — Ambulatory Visit: Payer: Medicare HMO

## 2019-01-07 ENCOUNTER — Telehealth: Payer: Self-pay | Admitting: Family Medicine

## 2019-01-07 NOTE — Telephone Encounter (Signed)
Pt showed up for medicare wellness visit with Katha Cabal. I let him know that the note showed he was supposed to call back to make visit virtual. Pt does not have video capability. I let him know that I would reach out to see when we need to schedule him and someone will reach out to him to get this rescheduled.

## 2019-01-08 NOTE — Telephone Encounter (Signed)
Called patient and he will do virtual visit with Lesia on 01/09/19 @ 11:30am.

## 2019-01-09 ENCOUNTER — Ambulatory Visit (INDEPENDENT_AMBULATORY_CARE_PROVIDER_SITE_OTHER): Payer: Medicare HMO

## 2019-01-09 ENCOUNTER — Telehealth: Payer: Self-pay

## 2019-01-09 DIAGNOSIS — Z0000 Encounter for general adult medical examination without abnormal findings: Secondary | ICD-10-CM

## 2019-01-09 NOTE — Progress Notes (Signed)
Subjective:   Anthony [name] is a 73 y.o. male who presents for Medicare Annual/Subsequent preventive examination.  Review of Systems:  N/A Cardiac Risk Factors include: advanced age (>34men, >100 women);obesity (BMI >30kg/m2);diabetes mellitus;male gender;dyslipidemia     Objective:    Vitals: There were no vitals taken for this visit.  There is no height or weight on file to calculate BMI.  Advanced Directives 01/09/2019 02/20/2018 01/01/2018 12/29/2016 07/28/2013 07/28/2013 07/21/2013  Does Patient Have a Medical Advance Directive? Yes Yes Yes Yes Patient has advance directive, copy not in chart - Patient has advance directive, copy not in chart  Type of Advance Directive [place];Living will Living will;Healthcare Power of [place];Living will Living will Living will Living will Living will  Does patient want to make changes to medical advance directive? - No - Patient declined - - No - -  Copy of [place] in Chart? No - copy requested No - copy requested No - copy requested - Copy requested from family Copy requested from family Copy requested from family  Pre-existing out of facility DNR order (yellow form or pink MOST form) - - - - No No No    Tobacco Social History   Tobacco Use  Smoking Status Former Smoker  . Packs/day: 0.30  . Years: 50.00  . Pack years: 15.00  . Types: Cigarettes  . Last attempt to quit: 08/29/2011  . Years since quitting: 7.3  Smokeless Tobacco Never Used     Counseling given: No   Clinical Intake:  Pre-visit preparation completed: Yes  Pain : 0-10 Pain Score: 5  Pain Type: Chronic pain Pain Location: Generalized        How often do you need to have someone help you when you read instructions, pamphlets, or other written materials from your doctor or pharmacy?: 1 - Never What is the last grade level you completed in school?: 10th grade  Interpreter Needed?: No   Comments: pt lives with spouse Information entered by :: LPinson, LPN  Past Medical History:  Diagnosis Date  . Anxiety   . Arthritis    a little  . Barrett's esophagus 12/08/2015   By EGD 2010 [name])   . BPH with obstruction/lower urinary tract symptoms 2014   s/p TURP  . Depression   . DKA (diabetic ketoacidoses) ([facility]) 01/2010   "in coma"  . GERD (gastroesophageal reflux disease)   . H/O bronchitis    after every cold  . HTN (hypertension)   . Hyperlipidemia    controlled with medicine  . Otitis media   . Rheumatic fever    maybe as child  . 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 ABI  12/2013   WNL  . APPENDECTOMY    . CHOLECYSTECTOMY     years ago  . COLONOSCOPY  01/2009   TA, rec rpt 5 yrs [name])  . COLONOSCOPY  01/2018   TAx2, diverticulosis, int hemorrhoids, rpt 5 yrs ([name])  . ESOPHAGOGASTRODUODENOSCOPY  01/2009   biopsy with Barrett's  . ESOPHAGOGASTRODUODENOSCOPY  01/2018   WNL - no barrett's ([name])  . LAPAROSCOPIC APPENDECTOMY  07/05/2011   Procedure: APPENDECTOMY LAPAROSCOPIC;  Surgeon: [name], MD;  Location: [facility];  Service: General;  Laterality: N/A;  . MENISCUS REPAIR Right 2018   [name] @ [facility]  . TRANSURETHRAL  RESECTION OF PROSTATE N/A 07/28/2013   Procedure: TRANSURETHRAL RESECTION OF THE PROSTATE WITH GYRUS INSTRUMENTS;  Surgeon: [name], MD  . VASECTOMY    . WISDOM TOOTH EXTRACTION     Family History  Problem Relation Age of Onset  . Lymphoma Mother        Non-hodgkins  . Coronary artery disease Mother   . Heart disease Mother   . Cancer Father   . Alcohol abuse Father   . Diabetes Brother   . Pancreatic cancer Maternal Uncle   . Diabetes Brother   . Diabetes Brother   . Diabetes Other   . Colon cancer Neg Hx   . Lung cancer Neg Hx   . Esophageal cancer Neg Hx    . Rectal cancer Neg Hx   . Stomach cancer Neg Hx    Social History   Socioeconomic History  . Marital status: Married    Spouse name: Not on file  . Number of children: Not on file  . Years of education: Not on file  . Highest education level: Not on file  Occupational History  . Not on file  Social Needs  . Financial resource strain: Not on file  . Food insecurity:    Worry: Not on file    Inability: Not on file  . Transportation needs:    Medical: Not on file    Non-medical: Not on file  Tobacco Use  . Smoking status: Former Smoker    Packs/day: 0.30    Years: 50.00    Pack years: 15.00    Types: Cigarettes    Last attempt to quit: 08/29/2011    Years since quitting: 7.3  . Smokeless tobacco: Never Used  Substance and Sexual Activity  . Alcohol use: Yes    Alcohol/week: 4.0 - 5.0 standard drinks    Types: 4 - 5 Standard drinks or equivalent per week  . Drug use: No  . Sexual activity: Not Currently  Lifestyle  . Physical activity:    Days per week: Not on file    Minutes per session: Not on file  . Stress: Not on file  Relationships  . Social connections:    Talks on phone: Not on file    Gets together: Not on file    Attends religious service: Not on file    Active member of club or organization: Not on file    Attends meetings of clubs or organizations: Not on file    Relationship status: Not on file  Other Topics Concern  . Not on file  Social History Narrative   MD ROSTER:   GI [name]   GS - Young      Daily Caffeine Use:  2   Owner/operater of dental lab-sold, but continues to work. Fully retired.   Married 1969   2 daughters 1972, 1976; 1 son 49   7 grandchildren   Edu: 10th Grade   Hobby: car restoration: has '66 Vette, two classic Chevelle SS's and is restoring a '37 chevy coup   Regular Exercise -  NO   Diet: good water, fruits/vegetables daily    Outpatient Encounter Medications as of 01/09/2019  Medication Sig  . amLODipine  (NORVASC) 5 MG tablet TAKE 1 TABLET BY MOUTH EVERY DAY  . Dextrose, Diabetic Use, (GLUCOSE PO) Take 1 tablet by mouth as needed.  [name] Kitchen glucose blood (ONE TOUCH ULTRA TEST) test strip Use as instructed 4x a day. DX CODE [id number].  [name] Kitchen insulin aspart (NOVOLOG  FLEXPEN) 100 UNIT/ML FlexPen Inject 10-18 Units into the skin 3 (three) times daily with meals.  . Insulin Degludec (TRESIBA FLEXTOUCH) 200 UNIT/ML SOPN Inject 32 Units into the skin daily.  . Insulin Pen Needle (ADVOCATE INSULIN PEN NEEDLES) 31G X 5 MM MISC Use 3x a day  . metFORMIN (GLUCOPHAGE-XR) 500 MG 24 hr tablet TAKE 1,000MG  BY MOUTH TWICE DAILY WITH MEALS (Patient taking differently: 500 mg. TAKE 1,000MG  BY MOUTH  TWICE DAILY WITH MEALS. TAKING ONE in MORNING DUE to diarrhea.)  . olmesartan-hydrochlorothiazide (BENICAR HCT) 40-25 MG tablet TAKE 1 TABLET BY MOUTH EVERY DAY  . pantoprazole (PROTONIX) 40 MG tablet Take 1 tablet (40 mg total) by mouth daily.  . potassium chloride (KLOR-CON M10) 10 MEQ tablet Take 1 tablet (10 mEq total) by mouth 2 (two) times daily.  . sildenafil (REVATIO) 20 MG tablet Take 2-4 tablets (40-80 mg total) by mouth daily as needed (relations).  . simvastatin (ZOCOR) 20 MG tablet Take 1 tablet (20 mg total) by mouth at bedtime.  [name] Kitchen glucagon (GLUCAGON EMERGENCY) 1 MG injection Inject 1 mg into the muscle once as needed. (Patient not taking: Reported on 01/09/2019)  . Insulin Detemir (LEVEMIR FLEXTOUCH) 100 UNIT/ML Pen Inject 36 Units into the skin at bedtime. (Patient not taking: Reported on 01/09/2019)   Facility-Administered Encounter Medications as of 01/09/2019  Medication  . 0.9 %  sodium chloride infusion    Activities of Daily Living In your present state of health, do you have any difficulty performing the following activities: 01/09/2019  Hearing? N  Vision? Y  Difficulty concentrating or making decisions? N  Walking or climbing stairs? N  Dressing or bathing? N  Doing errands, shopping? N  Preparing Food  and eating ? N  Using the Toilet? N  In the past six months, have you accidently leaked urine? N  Do you have problems with loss of bowel control? N  Managing your Medications? N  Managing your Finances? N  Housekeeping or managing your Housekeeping? N  Some recent data might be hidden    Patient Care Team: [name], MD as PCP - General (Family Medicine) [name], MD (Neurosurgery) [name], MD as Consulting Physician (General Surgery) [name], MD as Consulting Physician (Urology) Thelma Comp, [place] as Consulting Physician (Optometry)   Assessment:   This is a routine wellness examination for [place].  Vision Screening Comments: Last vision exam in April 2018 with Dr. [name]   Exercise Activities and Dietary recommendations Current Exercise Habits: The patient does not participate in regular exercise at present, Exercise limited by: None identified  Goals    . Patient Stated     Starting 01/09/2019, I will continue to taking medication as prescribed.        Fall Risk Fall Risk  01/09/2019 01/01/2018 12/29/2016 07/05/2015 06/30/2014  Falls in the past year? 0 No No No No   Depression Screen PHQ 2/9 Scores 01/09/2019 01/01/2018 12/29/2016 07/05/2015  PHQ - 2 Score 0 0 0 0  PHQ- 9 Score 0 0 - -    Cognitive Function MMSE - Mini Mental State Exam 01/09/2019 01/01/2018 12/29/2016  Orientation to time 5 5 5   Orientation to Place 5 5 5   Registration 3 3 3   Attention/ Calculation 0 0 0  Recall 3 3 3   Language- name 2 objects 0 0 0  Language- repeat 1 1 1   Language- follow 3 step command 0 3 3  Language- read & follow direction 0 0 0  Write a sentence 0 0 0  Copy design 0 0 0  Total score 17 20 20      PLEASE NOTE: A Mini-Cog screen was completed. Maximum score is 17. A value of 0 denotes this part of [place] MMSE was not completed or the patient failed this part of the Mini-Cog screening.   Mini-Cog Screening Orientation to Time - Max 5 pts Orientation to  Place - Max 5 pts Registration - Max 3 pts Recall - Max 3 pts Language Repeat - Max 1 pts      Immunization History  Administered Date(s) Administered  . Influenza Whole 06/02/2008, 06/02/2009, 06/02/2010, 07/08/2011, 07/08/2012  . Influenza, High Dose Seasonal PF 05/18/2017, 06/25/2018  . Influenza,inj,Quad PF,6+ Mos 07/05/2015  . Influenza-Unspecified 05/28/2013, 05/28/2014, 06/30/2016  . Pneumococcal Conjugate-13 06/18/2013  . Pneumococcal Polysaccharide-23 07/08/2011, 12/29/2016  . Tetanus 06/18/2013  . Zoster 08/26/2014  . Zoster Recombinat (Shingrix) 05/18/2017, 01/14/2018    Screening Tests Health Maintenance  Topic Date Due  . OPHTHALMOLOGY EXAM  12/06/2017  . DTaP/Tdap/Td (1 - Tdap) 06/19/2023 (Originally 06/13/1965)  . FOOT EXAM  01/15/2019  . INFLUENZA VACCINE  03/29/2019  . HEMOGLOBIN A1C  04/11/2019  . COLONOSCOPY  02/21/2023  . TETANUS/TDAP  06/19/2023  . Hepatitis C Screening  Completed  . PNA vac Low Risk Adult  Completed       Plan:     I have personally reviewed, addressed, and noted the following in the patient's chart:  A. Medical and social history B. Use of alcohol, tobacco or illicit drugs  C. Current medications and supplements D. Functional ability and status E.  Nutritional status F.  Physical activity G. Advance directives H. List of other physicians I.  Hospitalizations, surgeries, and ER visits in previous 12 months J.  Vitals (unless it is a telemedicine encounter) K. Screenings to include cognitive, depression, hearing, vision (NOTE: hearing and vision screenings not completed in telemedicine encounter) L. Referrals and appointments   In addition, I have reviewed and discussed with patient certain preventive protocols, quality metrics, and best practice recommendations. A written personalized care plan for preventive services and recommendations were provided to patient.  With patient's permission, we connected on 01/09/19 at 11:30  AM EDT by a video enabled telemedicine application. Two patient identifiers were used to ensure the encounter occurred with the correct person.    Patient was in home and writer was in office.   Signed,   Lindell Noe, MHA, BS, LPN Health Coach

## 2019-01-09 NOTE — Telephone Encounter (Signed)
FYI: During AWV, patient reported he is having hypoglycemic episodes after gardening and other outside activities. Using OTC glucose tablets. States he does not have Glucagon available. Prescription expired  05/18/18. Patient has future appt scheduled with you 01/24/19.

## 2019-01-09 NOTE — Progress Notes (Signed)
PCP notes:   Health maintenance:  Eye exam - addressed  Abnormal screenings:   None  Patient concerns:   Patient reports episodes of hypoglycemia when doing yard work. Using OTC glucose tablets. Does not have a Glucagon. Pending appt with endocrinology on 01/24/19.   Nurse concerns:  None  Next PCP appt:   01/14/19 @ 1100

## 2019-01-09 NOTE — Patient Instructions (Signed)
Anthony [name] , Thank you for taking time to come for your Medicare Wellness Visit. I appreciate your ongoing commitment to your health goals. Please review the following plan we discussed and let me know if I can assist you in the future.   These are the goals we discussed: Goals    . Patient Stated     Starting 01/09/2019, I will continue to taking medication as prescribed.        This is a list of the screening recommended for you and due dates:  Health Maintenance  Topic Date Due  . Eye exam for diabetics  08/27/2019*  . DTaP/Tdap/Td vaccine (1 - Tdap) 06/19/2023*  . Complete foot exam   01/15/2019  . Flu Shot  03/29/2019  . Hemoglobin A1C  04/11/2019  . Colon Cancer Screening  02/21/2023  . Tetanus Vaccine  06/19/2023  .  Hepatitis C: One time screening is recommended by Center for Disease Control  (CDC) for  adults born from [birth year] through 1963.   Completed  . Pneumonia vaccines  Completed  *Topic was postponed. The date shown is not the original due date.   Preventive Care for Adults  A healthy lifestyle and preventive care can promote health and wellness. Preventive health guidelines for adults include the following key practices.  . A routine yearly physical is a good way to check with your health care provider about your health and preventive screening. It is a chance to share any concerns and updates on your health and to receive a thorough exam.  . Visit your dentist for a routine exam and preventive care every 6 months. Brush your teeth twice a day and floss once a day. Good oral hygiene prevents tooth decay and gum disease.  . The frequency of eye exams is based on your age, health, family medical history, use  of contact lenses, and other factors. Follow your health care provider's recommendations for frequency of eye exams.  . Eat a healthy diet. Foods like vegetables, fruits, whole grains, low-fat dairy products, and lean protein foods contain the nutrients you need  without too many calories. Decrease your intake of foods high in solid fats, added sugars, and salt. Eat the right amount of calories for you. Get information about a proper diet from your health care provider, if necessary.  . Regular physical exercise is one of the most important things you can do for your health. Most adults should get at least 150 minutes of moderate-intensity exercise (any activity that increases your heart rate and causes you to sweat) each week. In addition, most adults need muscle-strengthening exercises on 2 or more days a week.  Silver Sneakers may be a benefit available to you. To determine eligibility, you may visit the website: www.silversneakers.com or contact program at 208-856-1190 Mon-Fri between 8AM-8PM.   . Maintain a healthy weight. The body mass index (BMI) is a screening tool to identify possible weight problems. It provides an estimate of body fat based on height and weight. Your health care provider can find your BMI and can help you achieve or maintain a healthy weight.   For adults 20 years and older: ? A BMI below 18.5 is considered underweight. ? A BMI of 18.5 to 24.9 is normal. ? A BMI of 25 to 29.9 is considered overweight. ? A BMI of 30 and above is considered obese.   . Maintain normal blood lipids and cholesterol levels by exercising and minimizing your intake of saturated fat. Eat a balanced  diet with plenty of fruit and vegetables. Blood tests for lipids and cholesterol should begin at age 2 and be repeated every 5 years. If your lipid or cholesterol levels are high, you are over 50, or you are at high risk for heart disease, you may need your cholesterol levels checked more frequently. Ongoing high lipid and cholesterol levels should be treated with medicines if diet and exercise are not working.  . If you smoke, find out from your health care provider how to quit. If you do not use tobacco, please do not start.  . If you choose to drink  alcohol, please do not consume more than 2 drinks per day. One drink is considered to be 12 ounces (355 mL) of beer, 5 ounces (148 mL) of wine, or 1.5 ounces (44 mL) of liquor.  . If you are 71-79 years old, ask your health care provider if you should take aspirin to prevent strokes.  . Use sunscreen. Apply sunscreen liberally and repeatedly throughout the day. You should seek shade when your shadow is shorter than you. Protect yourself by wearing long sleeves, pants, a wide-brimmed hat, and sunglasses year round, whenever you are outdoors.  . Once a month, do a whole body skin exam, using a mirror to look at the skin on your back. Tell your health care provider of new moles, moles that have irregular borders, moles that are larger than a pencil eraser, or moles that have changed in shape or color.

## 2019-01-09 NOTE — Telephone Encounter (Signed)
Thank you, will discuss with him and call in glucagon pen.

## 2019-01-10 ENCOUNTER — Telehealth: Payer: Self-pay

## 2019-01-10 MED ORDER — GLUCAGON (RDNA) 1 MG IJ KIT: 1.0000 mg | PACK | Freq: Once | INTRAMUSCULAR | 12 refills | Status: DC | PRN

## 2019-01-10 NOTE — Telephone Encounter (Signed)
FYI to PCP

## 2019-01-10 NOTE — Telephone Encounter (Signed)
-----  Message from Cristina Gherghe, MD sent at 01/09/2019  4:17 PM EDT ----- M,  Please see his chart >> he has low CBGs after working outside in the garden.  Can you please advise him to decrease the insulin dose before the meal prior to working outside as follows:  - Novolog 10-16 units before meals, but only 8 -10 units if planning to work outside after the meal  Also, can you please send a glucagon kit prescription for him? Ty, C  

## 2019-01-10 NOTE — Telephone Encounter (Signed)
Called patient, phone rang several times, no answer or VM.  Kit sent to pharmacy.

## 2019-01-12 ENCOUNTER — Other Ambulatory Visit: Payer: Self-pay | Admitting: Family Medicine

## 2019-01-12 DIAGNOSIS — E113299 Type 2 diabetes mellitus with mild nonproliferative diabetic retinopathy without macular edema, unspecified eye: Secondary | ICD-10-CM

## 2019-01-12 DIAGNOSIS — Z125 Encounter for screening for malignant neoplasm of prostate: Secondary | ICD-10-CM

## 2019-01-12 DIAGNOSIS — E785 Hyperlipidemia, unspecified: Secondary | ICD-10-CM

## 2019-01-13 ENCOUNTER — Other Ambulatory Visit: Payer: Self-pay

## 2019-01-13 ENCOUNTER — Other Ambulatory Visit (INDEPENDENT_AMBULATORY_CARE_PROVIDER_SITE_OTHER): Payer: Medicare HMO

## 2019-01-13 DIAGNOSIS — E113299 Type 2 diabetes mellitus with mild nonproliferative diabetic retinopathy without macular edema, unspecified eye: Secondary | ICD-10-CM | POA: Diagnosis not present

## 2019-01-13 DIAGNOSIS — E785 Hyperlipidemia, unspecified: Secondary | ICD-10-CM | POA: Diagnosis not present

## 2019-01-13 DIAGNOSIS — Z794 Long term (current) use of insulin: Secondary | ICD-10-CM

## 2019-01-13 DIAGNOSIS — Z125 Encounter for screening for malignant neoplasm of prostate: Secondary | ICD-10-CM

## 2019-01-13 LAB — COMPREHENSIVE METABOLIC PANEL
ALT: 14 U/L (ref 0–53)
AST: 14 U/L (ref 0–37)
Albumin: 3.9 g/dL (ref 3.5–5.2)
Alkaline Phosphatase: 78 U/L (ref 39–117)
BUN: 20 mg/dL (ref 6–23)
CO2: 29 mEq/L (ref 19–32)
Calcium: 8.8 mg/dL (ref 8.4–10.5)
Chloride: 105 mEq/L (ref 96–112)
Creatinine, Ser: 0.92 mg/dL (ref 0.40–1.50)
GFR: 81.2 mL/min (ref >60.00)
Glucose, Bld: 218 mg/dL — ABNORMAL HIGH (ref 70–99)
Potassium: 4 mEq/L (ref 3.5–5.1)
Sodium: 142 mEq/L (ref 135–145)
Total Bilirubin: 0.7 mg/dL (ref 0.2–1.2)
Total Protein: 5.9 g/dL — ABNORMAL LOW (ref 6.0–8.3)

## 2019-01-13 LAB — LIPID PANEL
Cholesterol: 120 mg/dL (ref 0–200)
HDL: 44.6 mg/dL (ref >39.00)
LDL Cholesterol: 61 mg/dL (ref 0–99)
NonHDL: 74.93
Total CHOL/HDL Ratio: 3
Triglycerides: 72 mg/dL (ref 0.0–149.0)
VLDL: 14.4 mg/dL (ref 0.0–40.0)

## 2019-01-13 LAB — HEMOGLOBIN A1C: Hgb A1c MFr Bld: 8.7 % — ABNORMAL HIGH (ref 4.6–6.5)

## 2019-01-13 LAB — PSA, MEDICARE: PSA: 2.44 ng/ml (ref 0.10–4.00)

## 2019-01-14 ENCOUNTER — Ambulatory Visit (INDEPENDENT_AMBULATORY_CARE_PROVIDER_SITE_OTHER): Payer: Medicare HMO | Admitting: Family Medicine

## 2019-01-14 ENCOUNTER — Encounter: Payer: Self-pay | Admitting: Family Medicine

## 2019-01-14 VITALS — BP 149/89 | HR 74 | Ht 71.0 in | Wt 200.0 lb

## 2019-01-14 DIAGNOSIS — E113299 Type 2 diabetes mellitus with mild nonproliferative diabetic retinopathy without macular edema, unspecified eye: Secondary | ICD-10-CM | POA: Diagnosis not present

## 2019-01-14 DIAGNOSIS — N529 Male erectile dysfunction, unspecified: Secondary | ICD-10-CM | POA: Diagnosis not present

## 2019-01-14 DIAGNOSIS — I10 Essential (primary) hypertension: Secondary | ICD-10-CM | POA: Diagnosis not present

## 2019-01-14 DIAGNOSIS — E785 Hyperlipidemia, unspecified: Secondary | ICD-10-CM

## 2019-01-14 DIAGNOSIS — Z7189 Other specified counseling: Secondary | ICD-10-CM | POA: Diagnosis not present

## 2019-01-14 DIAGNOSIS — K219 Gastro-esophageal reflux disease without esophagitis: Secondary | ICD-10-CM

## 2019-01-14 DIAGNOSIS — Z794 Long term (current) use of insulin: Secondary | ICD-10-CM

## 2019-01-14 MED ORDER — SILDENAFIL CITRATE 100 MG PO TABS: 50.0000 mg | ORAL_TABLET | Freq: Every day | ORAL | 3 refills | Status: DC | PRN

## 2019-01-14 NOTE — Assessment & Plan Note (Signed)
Advanced planning - thinks has living will at home but unsure. Would want wife to be HCPOA. Asked to bring [place] a copy.

## 2019-01-14 NOTE — Assessment & Plan Note (Signed)
Requests viagra 100mg  refilled. Previously on sildenafil 20mg  - but desires to return to 100mg  dose, aware of increased cost.

## 2019-01-14 NOTE — Assessment & Plan Note (Signed)
Chronic, mildly elevated. Continue current medicines.

## 2019-01-14 NOTE — Progress Notes (Signed)
Virtual visit attempted through Doximity dialer video call. Due to national recommendations of social distancing due to COVID-19, a virtual visit is felt to be most appropriate for this patient at this time. Interactive audio and video telecommunications were attempted between myself and Anthony [name], however failed due to patient having technical difficulties. We continued and completed visit with audio only.   Patient location: home Provider location:  at [facility], office If any vitals were documented, they were collected by patient at home unless specified below.    BP (!) 149/89 (BP Location: Left Arm, Patient Position: Sitting)   Pulse 74   Ht _0  (1.803 m)   Wt 200 lb (90.7 kg)   BMI 27.89 kg/m    CC: AMW f/u visit Subjective:    Patient ID: Anthony [name], Anthony [name]    DOB: [birth year], 72 y.o.   MRN: [id number]  HPI: Anthony [name] is a 73 y.o. Anthony [name] presenting on 01/14/2019 for Annual Exam (Pt 2. )   Saw Katha Cabal last week for medicare wellness visit. Note reviewed.  Recent hypoglycemia. DM followed by endo - glucagon kit has been called in. Recently started on tresiba.   Preventative: COLONOSCOPY 01/2018 - TAx2, diverticulosis, int hemorrhoids, rpt 5 yrs ([name])  Prostate cancer screening - s/p TURP for BPH. Released from [facility].  Normal voiding. Regular exams here in the past.  Lung cancer screening - ~17PY hx over lifetime.  Flu shotyearly  Pneumovax 06/2011, 2018. Prevnar10/2014 Tetanus - 05/2013 zostavax - 07/2014 shingrix - 04/2017, 12/2017 Advanced planning - thinks has living will at home but unsure. Would want wife to be HCPOA. Asked to bring [place] a copy.  Seat belt use discussed  Sunscreen use discussed. No changing moles on skin. Ex smoker quit 2013, <20 PY hx Alcohol - a few mixed drinks/night  Dentist - occasionally Eye exam - overdue Bowel - no constipation Bladder - no incontinence  Owner/operater of dental lab-sold, but  continues to work. Fully retired. Married 1969 2 daughters 1972, 1976; 1 son 22, 7 grandchildren Edu: 10th Grade Hobby: car restoration: has '66 Vette, two classic Chevelle SS's and is restoring a '37 chevy coup Activity: works yard daily (11.5 acres), no regular exercise Diet: good water, fruits/vegetables daily      Relevant past medical, surgical, family and social history reviewed and updated as indicated. Interim medical history since our last visit reviewed. Allergies and medications reviewed and updated. Outpatient Medications Prior to Visit  Medication Sig Dispense Refill  . amLODipine (NORVASC) 5 MG tablet TAKE 1 TABLET BY MOUTH EVERY DAY 90 tablet 0  . Dextrose, Diabetic Use, (GLUCOSE PO) Take 1 tablet by mouth as needed.    [name] Kitchen glucagon (GLUCAGON EMERGENCY) 1 MG injection Inject 1 mg into the muscle once as needed for up to 1 dose. 1 each 12  . glucose blood (ONE TOUCH ULTRA TEST) test strip Use as instructed 4x a day. DX CODE J19.4174. 400 each 3  . insulin aspart (NOVOLOG FLEXPEN) 100 UNIT/ML FlexPen Inject 10-18 Units into the skin 3 (three) times daily with meals. 45 mL 4  . Insulin Degludec (TRESIBA FLEXTOUCH) 200 UNIT/ML SOPN Inject 32 Units into the skin daily. 9 pen 3  . Insulin Detemir (LEVEMIR FLEXTOUCH) 100 UNIT/ML Pen Inject 36 Units into the skin at bedtime. 15 pen 3  . Insulin Pen Needle (ADVOCATE INSULIN PEN NEEDLES) 31G X 5 MM MISC Use 3x a day 300 each 3  . metFORMIN (GLUCOPHAGE-XR) 500  MG 24 hr tablet TAKE 1,000MG BY MOUTH TWICE DAILY WITH MEALS (Patient taking differently: 500 mg. TAKE 1,000MG BY MOUTH  TWICE DAILY WITH MEALS. TAKING ONE in MORNING DUE to diarrhea.) 360 tablet 3  . olmesartan-hydrochlorothiazide (BENICAR HCT) 40-25 MG tablet TAKE 1 TABLET BY MOUTH EVERY DAY 90 tablet 0  . pantoprazole (PROTONIX) 40 MG tablet Take 1 tablet (40 mg total) by mouth daily. 90 tablet 3  . potassium chloride (KLOR-CON M10) 10 MEQ tablet Take 1 tablet (10 mEq total) by  mouth 2 (two) times daily. 60 tablet 3  . sildenafil (REVATIO) 20 MG tablet Take 2-4 tablets (40-80 mg total) by mouth daily as needed (relations). 30 tablet 3  . simvastatin (ZOCOR) 20 MG tablet Take 1 tablet (20 mg total) by mouth at bedtime. 90 tablet 1   No facility-administered medications prior to visit.      Per HPI unless specifically indicated in ROS section below Review of Systems Objective:    BP (!) 149/89 (BP Location: Left Arm, Patient Position: Sitting)   Pulse 74   Ht _0  (1.803 m)   Wt 200 lb (90.7 kg)   BMI 27.89 kg/m   Wt Readings from Last 3 Encounters:  01/14/19 200 lb (90.7 kg)  10/11/18 210 lb (95.3 kg)  07/01/18 203 lb (92.1 kg)     Physical exam: Gen: alert, NAD, not ill appearing Pulm: speaks in complete sentences without increased work of breathing Psych: normal mood, normal thought content      Results for orders placed or performed in visit on 01/13/19  PSA, Medicare  Result Value Ref Range   PSA 2.44 0.10 - 4.00 ng/ml  Hemoglobin A1c  Result Value Ref Range   Hgb A1c MFr Bld 8.7 (H) 4.6 - 6.5 %  Comprehensive metabolic panel  Result Value Ref Range   Sodium 142 135 - 145 mEq/L   Potassium 4.0 3.5 - 5.1 mEq/L   Chloride 105 96 - 112 mEq/L   CO2 29 19 - 32 mEq/L   Glucose, Bld 218 (H) 70 - 99 mg/dL   BUN 20 6 - 23 mg/dL   Creatinine, Ser 0.92 0.40 - 1.50 mg/dL   Total Bilirubin 0.7 0.2 - 1.2 mg/dL   Alkaline Phosphatase 78 39 - 117 U/L   AST 14 0 - 37 U/L   ALT 14 0 - 53 U/L   Total Protein 5.9 (L) 6.0 - 8.3 g/dL   Albumin 3.9 3.5 - 5.2 g/dL   Calcium 8.8 8.4 - 10.5 mg/dL   GFR 81.20 >60.00 mL/min  Lipid panel  Result Value Ref Range   Cholesterol 120 0 - 200 mg/dL   Triglycerides 72.0 0.0 - 149.0 mg/dL   HDL 44.60 >39.00 mg/dL   VLDL 14.4 0.0 - 40.0 mg/dL   LDL Cholesterol 61 0 - 99 mg/dL   Total CHOL/HDL Ratio 3    NonHDL 74.93    Assessment & Plan:   Problem List Items Addressed This Visit    HLD (hyperlipidemia)     Chronic, stable. Continue current regimen.  The ASCVD Risk score Mikey Bussing DC Jr., et al., 2013) failed to calculate for the following reasons:   The valid total cholesterol range is 130 to 320 mg/dL       Relevant Medications   sildenafil (VIAGRA) 100 MG tablet   GERD    Chronic, stable on daily PPI.       Essential hypertension    Chronic, mildly elevated. Continue current medicines.  Relevant Medications   sildenafil (VIAGRA) 100 MG tablet   ED (erectile dysfunction)    Requests viagra 166m refilled. Previously on sildenafil 271m- but desires to return to 10046mose, aware of increased cost.       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 Appreciate endo care. Diabetic regimen has been adjusted.       Advanced care planning/counseling discussion - Primary    Advanced planning - thinks has living will at home but unsure. Would want wife to be HCPOA. Asked to bring us [place].           Meds ordered this encounter  Medications  . sildenafil (VIAGRA) 100 MG tablet    Sig: Take 0.5-1 tablets (50-100 mg total) by mouth daily as needed for erectile dysfunction.    Dispense:  10 tablet    Refill:  3   No orders of the defined types were placed in this encounter.   Follow up plan: Return in about 1 year (around 01/14/2020) for medicare wellness visit, annual exam, prior fasting for blood work.  JavRia BushD

## 2019-01-14 NOTE — Assessment & Plan Note (Signed)
Chronic, stable on daily PPI.  

## 2019-01-14 NOTE — Assessment & Plan Note (Signed)
Chronic, recent deterioration. Appreciate endo care. Diabetic regimen has been adjusted.

## 2019-01-14 NOTE — Assessment & Plan Note (Signed)
Chronic, stable. Continue current regimen. The ASCVD Risk score (Goff DC Jr., et al., 2013) failed to calculate for the following reasons:   The valid total cholesterol range is 130 to 320 mg/dL  

## 2019-01-15 ENCOUNTER — Other Ambulatory Visit: Payer: Medicare HMO

## 2019-01-21 ENCOUNTER — Encounter: Payer: Medicare HMO | Admitting: Family Medicine

## 2019-01-21 NOTE — Progress Notes (Signed)
I reviewed health advisor's note, was available for consultation, and agree with documentation and plan.  

## 2019-01-24 ENCOUNTER — Encounter: Payer: Self-pay | Admitting: Internal Medicine

## 2019-01-24 ENCOUNTER — Other Ambulatory Visit: Payer: Self-pay

## 2019-01-24 ENCOUNTER — Ambulatory Visit: Payer: Medicare HMO | Admitting: Internal Medicine

## 2019-01-24 ENCOUNTER — Ambulatory Visit (INDEPENDENT_AMBULATORY_CARE_PROVIDER_SITE_OTHER): Payer: Medicare HMO | Admitting: Internal Medicine

## 2019-01-24 DIAGNOSIS — E785 Hyperlipidemia, unspecified: Secondary | ICD-10-CM

## 2019-01-24 DIAGNOSIS — Z794 Long term (current) use of insulin: Secondary | ICD-10-CM

## 2019-01-24 DIAGNOSIS — E113299 Type 2 diabetes mellitus with mild nonproliferative diabetic retinopathy without macular edema, unspecified eye: Secondary | ICD-10-CM | POA: Diagnosis not present

## 2019-01-24 MED ORDER — INSULIN DEGLUDEC 200 UNIT/ML ~~LOC~~ SOPN: 36.0000 [IU] | PEN_INJECTOR | Freq: Every day | SUBCUTANEOUS | 3 refills | Status: DC

## 2019-01-24 NOTE — Patient Instructions (Addendum)
Please continue: - Metformin ER 1000 mg daily  Please change: - Novolog  - 10 units before small meals - 12-13 units before regular meals - 16 units before larger meals - Tresiba to 36 units daily (if you continue to have lows, decrease to 32 units)  Please return in 3 months with your sugar log.
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
## 2019-02-07 ENCOUNTER — Other Ambulatory Visit: Payer: Self-pay | Admitting: Family Medicine

## 2019-02-08 ENCOUNTER — Other Ambulatory Visit: Payer: Self-pay | Admitting: Internal Medicine

## 2019-02-12 ENCOUNTER — Ambulatory Visit: Payer: Medicare HMO | Admitting: Podiatry

## 2019-02-12 ENCOUNTER — Other Ambulatory Visit: Payer: Self-pay

## 2019-02-12 ENCOUNTER — Encounter: Payer: Self-pay | Admitting: Podiatry

## 2019-02-12 VITALS — Temp 97.2°F

## 2019-02-12 DIAGNOSIS — M2042 Other hammer toe(s) (acquired), left foot: Secondary | ICD-10-CM | POA: Diagnosis not present

## 2019-02-12 DIAGNOSIS — M2041 Other hammer toe(s) (acquired), right foot: Secondary | ICD-10-CM | POA: Diagnosis not present

## 2019-02-12 DIAGNOSIS — M79675 Pain in left toe(s): Secondary | ICD-10-CM

## 2019-02-12 DIAGNOSIS — M79674 Pain in right toe(s): Secondary | ICD-10-CM | POA: Diagnosis not present

## 2019-02-12 DIAGNOSIS — B351 Tinea unguium: Secondary | ICD-10-CM

## 2019-02-12 DIAGNOSIS — M2012 Hallux valgus (acquired), left foot: Secondary | ICD-10-CM | POA: Diagnosis not present

## 2019-02-12 DIAGNOSIS — E1142 Type 2 diabetes mellitus with diabetic polyneuropathy: Secondary | ICD-10-CM | POA: Diagnosis not present

## 2019-02-12 DIAGNOSIS — M2011 Hallux valgus (acquired), right foot: Secondary | ICD-10-CM | POA: Diagnosis not present

## 2019-02-12 NOTE — Progress Notes (Signed)
Subjective: Anthony [name] presents today with history of neuropathy. Patient seen for follow up of chronic, painful mycotic toenails  which interfere with daily activities and routine tasks.  Pain is aggravated when wearing enclosed shoe gear. Pain is getting progressively worse and relieved with periodic professional debridement.   [name], MD is his PCP.    Current Outpatient Medications:  .  amLODipine (NORVASC) 5 MG tablet, TAKE 1 TABLET BY MOUTH EVERY DAY, Disp: 90 tablet, Rfl: 0 .  Dextrose, Diabetic Use, (GLUCOSE PO), Take 1 tablet by mouth as needed., Disp: , Rfl:  .  glucagon (GLUCAGON EMERGENCY) 1 MG injection, Inject 1 mg into the muscle once as needed for up to 1 dose., Disp: 1 each, Rfl: 12 .  insulin aspart (NOVOLOG FLEXPEN) 100 UNIT/ML FlexPen, Inject 10-18 Units into the skin 3 (three) times daily with meals., Disp: 45 mL, Rfl: 4 .  Insulin Degludec (TRESIBA FLEXTOUCH) 200 UNIT/ML SOPN, Inject 36 Units into the skin daily., Disp: 9 pen, Rfl: 3 .  Insulin Pen Needle (ADVOCATE INSULIN PEN NEEDLES) 31G X 5 MM MISC, Use 3x a day, Disp: 300 each, Rfl: 3 .  metFORMIN (GLUCOPHAGE-XR) 500 MG 24 hr tablet, TAKE 1,000MG  BY MOUTH TWICE DAILY WITH MEALS (Patient taking differently: 500 mg. TAKE 1,000MG  BY MOUTH  TWICE DAILY WITH MEALS. TAKING ONE in MORNING DUE to diarrhea.), Disp: 360 tablet, Rfl: 3 .  olmesartan-hydrochlorothiazide (BENICAR HCT) 40-25 MG tablet, TAKE 1 TABLET BY MOUTH EVERY DAY, Disp: 90 tablet, Rfl: 0 .  ONETOUCH ULTRA test strip, USE AS INSTRUCTED 4X A DAY, Disp: 100 each, Rfl: 5 .  pantoprazole (PROTONIX) 40 MG tablet, Take 1 tablet (40 mg total) by mouth daily., Disp: 90 tablet, Rfl: 3 .  potassium chloride (KLOR-CON M10) 10 MEQ tablet, Take 1 tablet (10 mEq total) by mouth 2 (two) times daily., Disp: 60 tablet, Rfl: 3 .  sildenafil (REVATIO) 20 MG tablet, Take 2-4 tablets (40-80 mg total) by mouth daily as needed (relations)., Disp: 30 tablet, Rfl: 3 .   sildenafil (VIAGRA) 100 MG tablet, Take 0.5-1 tablets (50-100 mg total) by mouth daily as needed for erectile dysfunction., Disp: 10 tablet, Rfl: 3 .  simvastatin (ZOCOR) 20 MG tablet, TAKE 1 TABLET BY MOUTH EVERYDAY AT BEDTIME, Disp: 90 tablet, Rfl: 3  No Known Allergies  Objective: Vitals:   02/12/19 1126  Temp: (!) 97.2 F (36.2 C)    Vascular Examination: Capillary refill time <3 seconds  x 10 digits.  Dorsalis pedis pulses 2/4 b/l.  Posterior tibial pulses 1/4  b/l.  No digital hair x 10 digits.  Skin temperature WNL b/l.  Dermatological Examination: Skin with normal turgor, texture and tone b/l.  Toenails 1-5 b/l discolored, thick, dystrophic with subungual debris and pain with palpation to nailbeds due to thickness of nails.  Musculoskeletal: Muscle strength 5/5 to all LE muscle groups.  HAV with bunion b/l.  Hammertoe deformity digits 2-5 b/l.  Neurological: Sensation diminished with 10 gram monofilament.  Vibratory sensation diminished.  Assessment: 1. Painful onychomycosis toenails 1-5 b/l 2. NIDDM with neuropathy  Plan: 1. Toenails 1-5 b/l were debrided in length and girth without iatrogenic bleeding. 2. Patient to continue soft, supportive shoe gear daily. 3. Patient to report any pedal injuries to medical professional immediately. 4. Follow up 3 months.  5. Patient/POA to call should there be a concern in the interim.

## 2019-02-12 NOTE — Patient Instructions (Signed)
Diabetes Mellitus and Foot Care Foot care is an important part of your health, especially when you have diabetes. Diabetes may cause you to have problems because of poor blood flow (circulation) to your feet and legs, which can cause your skin to:  Become thinner and drier.  Break more easily.  Heal more slowly.  Peel and crack. You may also have nerve damage (neuropathy) in your legs and feet, causing decreased feeling in them. This means that you may not notice minor injuries to your feet that could lead to more serious problems. Noticing and addressing any potential problems early is the best way to prevent future foot problems. How to care for your feet Foot hygiene  Wash your feet daily with warm water and mild soap. Do not use hot water. Then, pat your feet and the areas between your toes until they are completely dry. Do not soak your feet as this can dry your skin.  Trim your toenails straight across. Do not dig under them or around the cuticle. File the edges of your nails with an emery board or nail file.  Apply a moisturizing lotion or petroleum jelly to the skin on your feet and to dry, brittle toenails. Use lotion that does not contain alcohol and is unscented. Do not apply lotion between your toes. Shoes and socks  Wear clean socks or stockings every day. Make sure they are not too tight. Do not wear knee-high stockings since they may decrease blood flow to your legs.  Wear shoes that fit properly and have enough cushioning. Always look in your shoes before you put them on to be sure there are no objects inside.  To break in new shoes, wear them for just a few hours a day. This prevents injuries on your feet. Wounds, scrapes, corns, and calluses  Check your feet daily for blisters, cuts, bruises, sores, and redness. If you cannot see the bottom of your feet, use a mirror or ask someone for help.  Do not cut corns or calluses or try to remove them with medicine.  If you  find a minor scrape, cut, or break in the skin on your feet, keep it and the skin around it clean and dry. You may clean these areas with mild soap and water. Do not clean the area with peroxide, alcohol, or iodine.  If you have a wound, scrape, corn, or callus on your foot, look at it several times a day to make sure it is healing and not infected. Check for: ? Redness, swelling, or pain. ? Fluid or blood. ? Warmth. ? Pus or a bad smell. General instructions  Do not cross your legs. This may decrease blood flow to your feet.  Do not use heating pads or hot water bottles on your feet. They may burn your skin. If you have lost feeling in your feet or legs, you may not know this is happening until it is too late.  Protect your feet from hot and cold by wearing shoes, such as at the beach or on hot pavement.  Schedule a complete foot exam at least once a year (annually) or more often if you have foot problems. If you have foot problems, report any cuts, sores, or bruises to your health care provider immediately. Contact a health care provider if:  You have a medical condition that increases your risk of infection and you have any cuts, sores, or bruises on your feet.  You have an injury that is not   healing.  You have redness on your legs or feet.  You feel burning or tingling in your legs or feet.  You have pain or cramps in your legs and feet.  Your legs or feet are numb.  Your feet always feel cold.  You have pain around a toenail. Get help right away if:  You have a wound, scrape, corn, or callus on your foot and: ? You have pain, swelling, or redness that gets worse. ? You have fluid or blood coming from the wound, scrape, corn, or callus. ? Your wound, scrape, corn, or callus feels warm to the touch. ? You have pus or a bad smell coming from the wound, scrape, corn, or callus. ? You have a fever. ? You have a red line going up your leg. Summary  Check your feet every day  for cuts, sores, red spots, swelling, and blisters.  Moisturize feet and legs daily.  Wear shoes that fit properly and have enough cushioning.  If you have foot problems, report any cuts, sores, or bruises to your health care provider immediately.  Schedule a complete foot exam at least once a year (annually) or more often if you have foot problems. This information is not intended to replace advice given to you by your health care provider. Make sure you discuss any questions you have with your health care provider. Document Released: 08/11/2000 Document Revised: 09/26/2017 Document Reviewed: 09/15/2016 Elsevier Interactive Patient Education  2019 [employer]  Onychomycosis/Fungal Toenails  WHAT IS IT? An infection that lies within the keratin of your nail plate that is caused by a fungus.  WHY ME? Fungal infections affect all ages, sexes, races, and creeds.  There may be many factors that predispose you to a fungal infection such as age, coexisting medical conditions such as diabetes, or an autoimmune disease; stress, medications, fatigue, genetics, etc.  Bottom line: fungus thrives in a warm, moist environment and your shoes offer such a location.  IS IT CONTAGIOUS? Theoretically, yes.  You do not want to share shoes, nail clippers or files with someone who has fungal toenails.  Walking around barefoot in the same room or sleeping in the same bed is unlikely to transfer the organism.  It is important to realize, however, that fungus can spread easily from one nail to the next on the same foot.  HOW DO WE TREAT THIS?  There are several ways to treat this condition.  Treatment may depend on many factors such as age, medications, pregnancy, liver and kidney conditions, etc.  It is best to ask your doctor which options are available to you.  1. No treatment.   Unlike many other medical concerns, you can live with this condition.  However for many people this can be a painful condition and  may lead to ingrown toenails or a bacterial infection.  It is recommended that you keep the nails cut short to help reduce the amount of fungal nail. 2. Topical treatment.  These range from herbal remedies to prescription strength nail lacquers.  About 40-50% effective, topicals require twice daily application for approximately 9 to 12 months or until an entirely new nail has grown out.  The most effective topicals are medical grade medications available through physicians offices. 3. Oral antifungal medications.  With an 80-90% cure rate, the most common oral medication requires 3 to 4 months of therapy and stays in your system for a year as the new nail grows out.  Oral antifungal medications do require   blood work to make sure it is a safe drug for you.  A liver function panel will be performed prior to starting the medication and after the first month of treatment.  It is important to have the blood work performed to avoid any harmful side effects.  In general, this medication safe but blood work is required. 4. Laser Therapy.  This treatment is performed by applying a specialized laser to the affected nail plate.  This therapy is noninvasive, fast, and non-painful.  It is not covered by insurance and is therefore, out of pocket.  The results have been very good with a 80-95% cure rate.  [facility] is the only practice in the area to offer this therapy. 5. Permanent Nail Avulsion.  Removing the entire nail so that a new nail will not grow back. 

## 2019-03-10 ENCOUNTER — Other Ambulatory Visit: Payer: Self-pay | Admitting: Family Medicine

## 2019-03-26 ENCOUNTER — Other Ambulatory Visit: Payer: Self-pay | Admitting: Family Medicine

## 2019-03-29 ENCOUNTER — Other Ambulatory Visit: Payer: Self-pay | Admitting: Family Medicine

## 2019-04-05 ENCOUNTER — Other Ambulatory Visit: Payer: Self-pay | Admitting: Internal Medicine

## 2019-04-07 ENCOUNTER — Other Ambulatory Visit: Payer: Self-pay

## 2019-04-07 MED ORDER — TRESIBA FLEXTOUCH 200 UNIT/ML ~~LOC~~ SOPN: 32.0000 [IU] | PEN_INJECTOR | Freq: Every day | SUBCUTANEOUS | 0 refills | Status: DC

## 2019-04-18 DIAGNOSIS — R69 Illness, unspecified: Secondary | ICD-10-CM | POA: Diagnosis not present

## 2019-05-19 ENCOUNTER — Encounter: Payer: Self-pay | Admitting: Podiatry

## 2019-05-19 ENCOUNTER — Ambulatory Visit (INDEPENDENT_AMBULATORY_CARE_PROVIDER_SITE_OTHER): Payer: Medicare HMO | Admitting: Podiatry

## 2019-05-19 ENCOUNTER — Other Ambulatory Visit: Payer: Self-pay

## 2019-05-19 DIAGNOSIS — E1142 Type 2 diabetes mellitus with diabetic polyneuropathy: Secondary | ICD-10-CM

## 2019-05-19 DIAGNOSIS — M79675 Pain in left toe(s): Secondary | ICD-10-CM | POA: Diagnosis not present

## 2019-05-19 DIAGNOSIS — B351 Tinea unguium: Secondary | ICD-10-CM | POA: Diagnosis not present

## 2019-05-19 DIAGNOSIS — M79674 Pain in right toe(s): Secondary | ICD-10-CM | POA: Diagnosis not present

## 2019-05-19 LAB — HM DIABETES FOOT EXAM

## 2019-05-19 NOTE — Patient Instructions (Signed)
Diabetic Neuropathy Diabetic neuropathy refers to nerve damage that is caused by diabetes (diabetes mellitus). Over time, people with diabetes can develop nerve damage throughout the body. There are several types of diabetic neuropathy:  Peripheral neuropathy. This is the most common type of diabetic neuropathy. It causes damage to nerves that carry signals between the spinal cord and other parts of the body (peripheral nerves). This usually affects nerves in the feet and legs first, and may eventually affect the hands and arms. The damage affects the ability to sense touch or temperature.  Autonomic neuropathy. This type causes damage to nerves that control involuntary functions (autonomic nerves). These nerves carry signals that control: ? Heartbeat. ? Body temperature. ? Blood pressure. ? Urination. ? Digestion. ? Sweating. ? Sexual function. ? Response to changing blood sugar (glucose) levels.  Focal neuropathy. This type of nerve damage affects one area of the body, such as an arm, a leg, or the face. The injury may involve one nerve or a small group of nerves. Focal neuropathy can be painful and unpredictable, and occurs most often in older adults with diabetes. This often develops suddenly, but usually improves over time and does not cause long-term problems.  Proximal neuropathy. This type of nerve damage affects the nerves of the thighs, hips, buttocks, or legs. It causes severe pain, weakness, and muscle death (atrophy), usually in the thigh muscles. It is more common among older men and people who have type 2 diabetes. The length of recovery time may vary. What are the causes? Peripheral, autonomic, and focal neuropathies are caused by diabetes that is not well controlled with treatment. The cause of proximal neuropathy is not known, but it may be caused by inflammation related to uncontrolled blood glucose levels. What are the signs or symptoms? Peripheral neuropathy Peripheral  neuropathy develops slowly over time. When the nerves of the feet and legs no longer work, you may experience:  Burning, stabbing, or aching pain in the legs or feet.  Pain or cramping in the legs or feet.  Loss of feeling (numbness) and inability to feel pressure or pain in the feet. This can lead to: ? Thick calluses or sores on areas of constant pressure. ? Ulcers. ? Reduced ability to feel temperature changes.  Foot deformities.  Muscle weakness.  Loss of balance or coordination. Autonomic neuropathy The symptoms of autonomic neuropathy vary depending on which nerves are affected. Symptoms may include:  Problems with digestion, such as: ? Nausea or vomiting. ? Poor appetite. ? Bloating. ? Diarrhea or constipation. ? Trouble swallowing. ? Losing weight without trying to.  Problems with the heart, blood and lungs, such as: ? Dizziness, especially when standing up. ? Fainting. ? Shortness of breath. ? Irregular heartbeat.  Bladder problems, such as: ? Trouble starting or stopping urination. ? Leaking urine. ? Trouble emptying the bladder. ? Urinary tract infections (UTIs).  Problems with other body functions, such as: ? Sweat. You may sweat too much or too little. ? Temperature. You might get hot easily. Or, you might feel cold more than usual. ? Sexual function. Men may not be able to get or maintain an erection. Women may have vaginal dryness and difficulty with arousal. Focal neuropathy Symptoms affect only one area of the body. Common symptoms include:  Numbness.  Tingling.  Burning pain.  Prickling feeling.  Very sensitive skin.  Weakness.  Inability to move (paralysis).  Muscle twitching.  Muscles getting smaller (wasting).  Poor coordination.  Double or blurred vision. Proximal   neuropathy  Sudden, severe pain in the hip, thigh, or buttocks. Pain may spread from the back into the legs (sciatica).  Pain and numbness in the arms and legs.   Tingling.  Loss of bladder control or bowel control.  Weakness and wasting of thigh muscles.  Difficulty getting up from a seated position.  Abdominal swelling.  Unexplained weight loss. How is this diagnosed? Diagnosis usually involves reviewing your medical history and any symptoms you have. Diagnosis varies depending on the type of neuropathy your health care provider suspects. Peripheral neuropathy Your health care provider will check areas that are affected by your nervous system (neurologic exam), such as your reflexes, how you move, and what you can feel. You may have other tests, such as:  Blood tests.  Removal and examination of fluid that surrounds the spinal cord (lumbar puncture).  CT scan.  MRI.  A test to check the nerves that control muscles (electromyogram, EMG).  Tests of how quickly messages pass through your nerves (nerve conduction velocity tests).  Removal of a small piece of nerve to be examined under a microscope (biopsy). Autonomic neuropathy You may have tests, such as:  Tests to measure your blood pressure and heart rate. This may include monitoring you while you are safely secured to an exam table that moves you from a lying position to an upright position (table tilt test).  Breathing tests to check your lungs.  Tests to check how food moves through the digestive system (gastric emptying tests).  Blood, sweat, or urine tests.  Ultrasound of your bladder.  Spinal fluid tests. Focal neuropathy This condition may be diagnosed with:  A neurologic exam.  CT scan.  MRI.  EMG.  Nerve conduction velocity tests. Proximal neuropathy There is no test to diagnose this type of neuropathy. You may have tests to rule out other possible causes of this type of neuropathy. Tests may include:  X-rays of your spine and lumbar region.  Lumbar puncture.  MRI. How is this treated? The goal of treatment is to keep nerve damage from getting worse.  The most important part of treatment is keeping your blood glucose level and your A1C level within your target range by following your diabetes management plan. Over time, maintaining lower blood glucose levels helps lessen symptoms. In some cases, you may need prescription pain medicine. Follow these instructions at home:  Lifestyle   Do not use any products that contain nicotine or tobacco, such as cigarettes and e-cigarettes. If you need help quitting, ask your health care provider.  Be physically active every day. Include strength training and balance exercises.  Follow a healthy meal plan.  Work with your health care provider to manage your blood pressure. General instructions  Follow your diabetes management plan as directed. ? Check your blood glucose levels as directed by your health care provider. ? Keep your blood glucose in your target range as directed by your health care provider. ? Have your A1C level checked at least two times a year, or as often as told by your health care provider.  Take over the counter and prescription medicines only as told by your health care provider. This includes insulin and diabetes medicine.  Do not drive or use heavy machinery while taking prescription pain medicines.  Check your skin and feet every day for cuts, bruises, redness, blisters, or sores.  Keep all follow up visits as told by your health care provider. This is important. Contact a health care provider if:    You have burning, stabbing, or aching pain in your legs or feet.  You are unable to feel pressure or pain in your feet.  You develop problems with digestion, such as: ? Nausea. ? Vomiting. ? Bloating. ? Constipation. ? Diarrhea. ? Abdominal pain.  You have difficulty with urination, such as inability: ? To control when you urinate (incontinence). ? To completely empty the bladder (retention).  You have palpitations.  You feel dizzy, weak, or faint when you stand  up. Get help right away if:  You cannot urinate.  You have sudden weakness or loss of coordination.  You have trouble speaking.  You have pain or pressure in your chest.  You have an irregular heart beat.  You have sudden inability to move a part of your body. Summary  Diabetic neuropathy refers to nerve damage that is caused by diabetes. It can affect nerves throughout the entire body, causing numbness and pain in the arms, legs, digestive tract, heart, and other body systems.  Keep your blood glucose level and your blood pressure in your target range, as directed by your health care provider. This can help prevent neuropathy from getting worse.  Check your skin and feet every day for cuts, bruises, redness, blisters, or sores.  Do not use any products that contain nicotine or tobacco, such as cigarettes and e-cigarettes. If you need help quitting, ask your health care provider. This information is not intended to replace advice given to you by your health care provider. Make sure you discuss any questions you have with your health care provider. Document Released: 10/23/2001 Document Revised: 09/26/2017 Document Reviewed: 09/18/2016 Elsevier Patient Education  2020 [employer]  Onychomycosis/Fungal Toenails  WHAT IS IT? An infection that lies within the keratin of your nail plate that is caused by a fungus.  WHY ME? Fungal infections affect all ages, sexes, races, and creeds.  There may be many factors that predispose you to a fungal infection such as age, coexisting medical conditions such as diabetes, or an autoimmune disease; stress, medications, fatigue, genetics, etc.  Bottom line: fungus thrives in a warm, moist environment and your shoes offer such a location.  IS IT CONTAGIOUS? Theoretically, yes.  You do not want to share shoes, nail clippers or files with someone who has fungal toenails.  Walking around barefoot in the same room or sleeping in the same bed is unlikely  to transfer the organism.  It is important to realize, however, that fungus can spread easily from one nail to the next on the same foot.  HOW DO WE TREAT THIS?  There are several ways to treat this condition.  Treatment may depend on many factors such as age, medications, pregnancy, liver and kidney conditions, etc.  It is best to ask your doctor which options are available to you.  1. No treatment.   Unlike many other medical concerns, you can live with this condition.  However for many people this can be a painful condition and may lead to ingrown toenails or a bacterial infection.  It is recommended that you keep the nails cut short to help reduce the amount of fungal nail. 2. Topical treatment.  These range from herbal remedies to prescription strength nail lacquers.  About 40-50% effective, topicals require twice daily application for approximately 9 to 12 months or until an entirely new nail has grown out.  The most effective topicals are medical grade medications available through physicians offices. 3. Oral antifungal medications.  With an 80-90% cure   rate, the most common oral medication requires 3 to 4 months of therapy and stays in your system for a year as the new nail grows out.  Oral antifungal medications do require blood work to make sure it is a safe drug for you.  A liver function panel will be performed prior to starting the medication and after the first month of treatment.  It is important to have the blood work performed to avoid any harmful side effects.  In general, this medication safe but blood work is required. 4. Laser Therapy.  This treatment is performed by applying a specialized laser to the affected nail plate.  This therapy is noninvasive, fast, and non-painful.  It is not covered by insurance and is therefore, out of pocket.  The results have been very good with a 80-95% cure rate.  [facility] is the only practice in the area to offer this therapy. 5. Permanent  Nail Avulsion.  Removing the entire nail so that a new nail will not grow back. 

## 2019-05-22 ENCOUNTER — Other Ambulatory Visit: Payer: Self-pay

## 2019-05-22 ENCOUNTER — Encounter: Payer: Self-pay | Admitting: Internal Medicine

## 2019-05-22 ENCOUNTER — Ambulatory Visit: Payer: Medicare HMO | Admitting: Internal Medicine

## 2019-05-22 VITALS — BP 160/90 | HR 71 | Ht 71.0 in | Wt 215.0 lb

## 2019-05-22 DIAGNOSIS — Z794 Long term (current) use of insulin: Secondary | ICD-10-CM

## 2019-05-22 DIAGNOSIS — E785 Hyperlipidemia, unspecified: Secondary | ICD-10-CM

## 2019-05-22 DIAGNOSIS — E113299 Type 2 diabetes mellitus with mild nonproliferative diabetic retinopathy without macular edema, unspecified eye: Secondary | ICD-10-CM

## 2019-05-22 DIAGNOSIS — E663 Overweight: Secondary | ICD-10-CM

## 2019-05-22 LAB — POCT GLYCOSYLATED HEMOGLOBIN (HGB A1C): Hemoglobin A1C: 6.9 % — AB (ref 4.0–5.6)

## 2019-05-22 MED ORDER — TRESIBA FLEXTOUCH 200 UNIT/ML ~~LOC~~ SOPN: 36.0000 [IU] | PEN_INJECTOR | Freq: Every day | SUBCUTANEOUS | 5 refills | Status: DC

## 2019-05-22 NOTE — Addendum Note (Signed)
Addended by: Cardell Peach I on: 05/22/2019 02:13 PM   Modules accepted: Orders
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
## 2019-05-22 NOTE — Patient Instructions (Addendum)
Please continue: - Metformin ER 1000 mg daily - Novolog  - 10 units before small meals - 12-13 units before regular meals - 16 units before larger meals - Tresiba 36 units daily   Please return in 4 months with your sugar log.

## 2019-05-22 NOTE — Progress Notes (Signed)
Subjective: Anthony [name] is seen today for follow up painful, elongated, thickened toenails 1-5 b/l feet that he cannot cut. Pain interferes with daily activities. Aggravating factor includes wearing enclosed shoe gear and relieved with periodic debridement.  Current Outpatient Medications on File Prior to Visit  Medication Sig  . amLODipine (NORVASC) 5 MG tablet TAKE 1 TABLET BY MOUTH EVERY DAY  . Dextrose, Diabetic Use, (GLUCOSE PO) Take 1 tablet by mouth as needed.  [name] Kitchen glucagon (GLUCAGON EMERGENCY) 1 MG injection Inject 1 mg into the muscle once as needed for up to 1 dose.  . insulin aspart (NOVOLOG FLEXPEN) 100 UNIT/ML FlexPen Inject 10-18 Units into the skin 3 (three) times daily with meals.  . Insulin Pen Needle (ADVOCATE INSULIN PEN NEEDLES) 31G X 5 MM MISC Use 3x a day  . metFORMIN (GLUCOPHAGE-XR) 500 MG 24 hr tablet TAKE 1,000MG  BY MOUTH TWICE DAILY WITH MEALS (Patient taking differently: 500 mg. TAKE 1,000MG  BY MOUTH  TWICE DAILY WITH MEALS. TAKING ONE in MORNING DUE to diarrhea.)  . olmesartan-hydrochlorothiazide (BENICAR HCT) 40-25 MG tablet TAKE 1 TABLET BY MOUTH EVERY DAY  . ONETOUCH ULTRA test strip USE AS INSTRUCTED 4X A DAY  . pantoprazole (PROTONIX) 40 MG tablet TAKE 1 TABLET BY MOUTH EVERY DAY  . potassium chloride (KLOR-CON M10) 10 MEQ tablet Take 1 tablet (10 mEq total) by mouth 2 (two) times daily.  . sildenafil (REVATIO) 20 MG tablet Take 2-4 tablets (40-80 mg total) by mouth daily as needed (relations).  . sildenafil (VIAGRA) 100 MG tablet Take 0.5-1 tablets (50-100 mg total) by mouth daily as needed for erectile dysfunction.  . simvastatin (ZOCOR) 20 MG tablet TAKE 1 TABLET BY MOUTH EVERYDAY AT BEDTIME   No current facility-administered medications on file prior to visit.      No Known Allergies   Objective:  Vascular Examination: Capillary refill time < 3 seconds x 10 digits.  Dorsalis pedis present 2/4 b/l.  Posterior tibial pulses 1/4 b/l.  Digital  hair absent b/l.  Skin temperature gradient WNL b/l.   Dermatological Examination: Skin with normal turgor, texture and tone b/l.  Toenails 1-5 b/l discolored, thick, dystrophic with subungual debris and pain with palpation to nailbeds due to thickness of nails.  Musculoskeletal: Muscle strength 5/5 to all LE muscle groups  HAV with bunion b/l. Hammertoe deformity 2-5 b/l.  No pain, crepitus or joint limitation noted with ROM.   Neurological Examination: Protective sensation diminished with 10 gram monofilament bilaterally.  Vibratory sensation diminished b/l.  Assessment: Painful onychomycosis toenails 1-5 b/l  NIDDM with neuropathy  Plan: 1. Toenails 1-5 b/l were debrided in length and girth without iatrogenic bleeding. 2. Patient to continue soft, supportive shoe gear 3. Patient to report any pedal injuries to medical professional immediately. 4. Follow up 3 months.  5. Patient/POA to call should there be a concern in the interim.

## 2019-07-16 DIAGNOSIS — H2513 Age-related nuclear cataract, bilateral: Secondary | ICD-10-CM | POA: Diagnosis not present

## 2019-07-16 LAB — HM DIABETES EYE EXAM

## 2019-08-11 ENCOUNTER — Encounter: Payer: Self-pay | Admitting: Family Medicine

## 2019-08-16 ENCOUNTER — Other Ambulatory Visit: Payer: Self-pay | Admitting: Internal Medicine

## 2019-08-18 ENCOUNTER — Encounter: Payer: Self-pay | Admitting: Podiatry

## 2019-08-18 ENCOUNTER — Ambulatory Visit: Payer: Medicare HMO | Admitting: Podiatry

## 2019-08-18 ENCOUNTER — Other Ambulatory Visit: Payer: Self-pay

## 2019-08-18 DIAGNOSIS — M79675 Pain in left toe(s): Secondary | ICD-10-CM

## 2019-08-18 DIAGNOSIS — E1142 Type 2 diabetes mellitus with diabetic polyneuropathy: Secondary | ICD-10-CM | POA: Diagnosis not present

## 2019-08-18 DIAGNOSIS — M79674 Pain in right toe(s): Secondary | ICD-10-CM | POA: Diagnosis not present

## 2019-08-18 DIAGNOSIS — B351 Tinea unguium: Secondary | ICD-10-CM | POA: Diagnosis not present

## 2019-08-18 NOTE — Patient Instructions (Signed)
Diabetes Mellitus and Foot Care Foot care is an important part of your health, especially when you have diabetes. Diabetes may cause you to have problems because of poor blood flow (circulation) to your feet and legs, which can cause your skin to:  Become thinner and drier.  Break more easily.  Heal more slowly.  Peel and crack. You may also have nerve damage (neuropathy) in your legs and feet, causing decreased feeling in them. This means that you may not notice minor injuries to your feet that could lead to more serious problems. Noticing and addressing any potential problems early is the best way to prevent future foot problems. How to care for your feet Foot hygiene  Wash your feet daily with warm water and mild soap. Do not use hot water. Then, pat your feet and the areas between your toes until they are completely dry. Do not soak your feet as this can dry your skin.  Trim your toenails straight across. Do not dig under them or around the cuticle. File the edges of your nails with an emery board or nail file.  Apply a moisturizing lotion or petroleum jelly to the skin on your feet and to dry, brittle toenails. Use lotion that does not contain alcohol and is unscented. Do not apply lotion between your toes. Shoes and socks  Wear clean socks or stockings every day. Make sure they are not too tight. Do not wear knee-high stockings since they may decrease blood flow to your legs.  Wear shoes that fit properly and have enough cushioning. Always look in your shoes before you put them on to be sure there are no objects inside.  To break in new shoes, wear them for just a few hours a day. This prevents injuries on your feet. Wounds, scrapes, corns, and calluses  Check your feet daily for blisters, cuts, bruises, sores, and redness. If you cannot see the bottom of your feet, use a mirror or ask someone for help.  Do not cut corns or calluses or try to remove them with medicine.  If you  find a minor scrape, cut, or break in the skin on your feet, keep it and the skin around it clean and dry. You may clean these areas with mild soap and water. Do not clean the area with peroxide, alcohol, or iodine.  If you have a wound, scrape, corn, or callus on your foot, look at it several times a day to make sure it is healing and not infected. Check for: ? Redness, swelling, or pain. ? Fluid or blood. ? Warmth. ? Pus or a bad smell. General instructions  Do not cross your legs. This may decrease blood flow to your feet.  Do not use heating pads or hot water bottles on your feet. They may burn your skin. If you have lost feeling in your feet or legs, you may not know this is happening until it is too late.  Protect your feet from hot and cold by wearing shoes, such as at the beach or on hot pavement.  Schedule a complete foot exam at least once a year (annually) or more often if you have foot problems. If you have foot problems, report any cuts, sores, or bruises to your health care provider immediately. Contact a health care provider if:  You have a medical condition that increases your risk of infection and you have any cuts, sores, or bruises on your feet.  You have an injury that is not   healing.  You have redness on your legs or feet.  You feel burning or tingling in your legs or feet.  You have pain or cramps in your legs and feet.  Your legs or feet are numb.  Your feet always feel cold.  You have pain around a toenail. Get help right away if:  You have a wound, scrape, corn, or callus on your foot and: ? You have pain, swelling, or redness that gets worse. ? You have fluid or blood coming from the wound, scrape, corn, or callus. ? Your wound, scrape, corn, or callus feels warm to the touch. ? You have pus or a bad smell coming from the wound, scrape, corn, or callus. ? You have a fever. ? You have a red line going up your leg. Summary  Check your feet every day  for cuts, sores, red spots, swelling, and blisters.  Moisturize feet and legs daily.  Wear shoes that fit properly and have enough cushioning.  If you have foot problems, report any cuts, sores, or bruises to your health care provider immediately.  Schedule a complete foot exam at least once a year (annually) or more often if you have foot problems. This information is not intended to replace advice given to you by your health care provider. Make sure you discuss any questions you have with your health care provider. Document Released: 08/11/2000 Document Revised: 09/26/2017 Document Reviewed: 09/15/2016 Elsevier Patient Education  2020 [employer]  

## 2019-08-25 NOTE — Progress Notes (Signed)
Subjective: Anthony [name] is a 73 y.o. y.o. male with h/o diabetes who presents today for preventative diabetic foot care. Patient has painful, elongated mycotic toenails which pose a risk and interfere with daily activities. Pain is aggravated when wearing enclosed shoe gear and relieved with periodic professional debridement.  [name], MD is patient's PCP.   Medications reviewed in chart.  No Known Allergies  Objective: There were no vitals filed for this visit.  Vascular Examination: Capillary refill time to digits <3 seconds b/l.  Dorsalis pedis pulses palpable b/l.  Posterior tibial pulses faintly palpable b/l.  Digital hair absent b/l.  Skin temperature gradient WNL b/l.  Dermatological Examination: Skin with normal turgor, texture and tone b/l.  No open wounds b/l.  Toenails 1-5 b/l discolored, thick, dystrophic with subungual debris and pain with palpation to nailbeds due to thickness of nails.  Musculoskeletal: Muscle strength 5/5 to all LE muscle groups b/l.  HAV with bunion b/l.  Hammertoes lesser digits b/l.  Neurological: Sensation diminished b/l with 10 gram monofilament.  Vibratory sensation diminished b/l.  Last A1c:  Hemoglobin A1C Latest Ref Rng & Units 05/22/2019 01/13/2019 10/11/2018  HGBA1C 4.0 - 5.6 % 6.9(A) 8.7(H) 7.8(A)  Some recent data might be hidden   Assessment: 1. Painful onychomycosis toenails 1-5 b/l 2. NIDDM with neuropathy  Plan: 1. Continue diabetic foot care principles. Literature dispensed on today. 2. Toenails 1-5 b/l were debrided in length and girth without iatrogenic bleeding. 3. Patient to continue soft, supportive shoe gear daily. 4. Patient to report any pedal injuries to medical professional immediately. 5. Follow up 3 months.  6. Patient/POA to call should there be a concern in the interim.

## 2019-09-23 ENCOUNTER — Other Ambulatory Visit: Payer: Self-pay

## 2019-09-25 ENCOUNTER — Other Ambulatory Visit: Payer: Self-pay

## 2019-09-25 ENCOUNTER — Encounter: Payer: Self-pay | Admitting: Internal Medicine

## 2019-09-25 ENCOUNTER — Ambulatory Visit (INDEPENDENT_AMBULATORY_CARE_PROVIDER_SITE_OTHER): Payer: Medicare HMO | Admitting: Internal Medicine

## 2019-09-25 DIAGNOSIS — E113299 Type 2 diabetes mellitus with mild nonproliferative diabetic retinopathy without macular edema, unspecified eye: Secondary | ICD-10-CM

## 2019-09-25 DIAGNOSIS — Z794 Long term (current) use of insulin: Secondary | ICD-10-CM | POA: Diagnosis not present

## 2019-09-25 DIAGNOSIS — E663 Overweight: Secondary | ICD-10-CM | POA: Diagnosis not present

## 2019-09-25 DIAGNOSIS — E785 Hyperlipidemia, unspecified: Secondary | ICD-10-CM
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
## 2019-09-25 NOTE — Patient Instructions (Addendum)
Please continue: - Metformin ER 1000 mg daily - Tresiba 36 units daily   Please increase: - Novolog  - 12 units before small meals - 16 units before regular meals - 18(-20) units before larger meals   Please return in 3-4 months with your sugar log.

## 2019-10-17 DIAGNOSIS — R69 Illness, unspecified: Secondary | ICD-10-CM | POA: Diagnosis not present

## 2019-10-27 ENCOUNTER — Ambulatory Visit: Payer: Medicare HMO | Attending: Internal Medicine

## 2019-10-27 DIAGNOSIS — Z23 Encounter for immunization: Secondary | ICD-10-CM

## 2019-10-27 NOTE — Progress Notes (Signed)
   Covid-19 Vaccination Clinic  Name:  Anthony [name]    MRN: [id number] DOB: 11-10-1945  10/27/2019  [name] was observed post Covid-19 immunization for 15 minutes without incidence. He was provided with Vaccine Information Sheet and instruction to access the V-Safe system.   [name] was instructed to call 911 with any severe reactions post vaccine: [name] Kitchen Difficulty breathing  . Swelling of your face and throat  . A fast heartbeat  . A bad rash all over your body  . Dizziness and weakness    Immunizations Administered    Name Date Dose VIS Date Route   Pfizer COVID-19 Vaccine 10/27/2019 12:08 PM 0.3 mL 08/08/2019 Intramuscular   Manufacturer: [place]   Lot: [id number]   [place]: [id number]

## 2019-11-18 ENCOUNTER — Encounter: Payer: Self-pay | Admitting: Podiatry

## 2019-11-18 ENCOUNTER — Ambulatory Visit: Payer: Medicare HMO | Admitting: Podiatry

## 2019-11-18 ENCOUNTER — Other Ambulatory Visit: Payer: Self-pay

## 2019-11-18 VITALS — Temp 97.2°F

## 2019-11-18 DIAGNOSIS — M79674 Pain in right toe(s): Secondary | ICD-10-CM

## 2019-11-18 DIAGNOSIS — E1142 Type 2 diabetes mellitus with diabetic polyneuropathy: Secondary | ICD-10-CM | POA: Diagnosis not present

## 2019-11-18 DIAGNOSIS — B351 Tinea unguium: Secondary | ICD-10-CM

## 2019-11-18 DIAGNOSIS — L84 Corns and callosities: Secondary | ICD-10-CM

## 2019-11-18 DIAGNOSIS — M79675 Pain in left toe(s): Secondary | ICD-10-CM | POA: Diagnosis not present

## 2019-11-18 NOTE — Patient Instructions (Signed)
Diabetes Mellitus and Foot Care Foot care is an important part of your health, especially when you have diabetes. Diabetes may cause you to have problems because of poor blood flow (circulation) to your feet and legs, which can cause your skin to:  Become thinner and drier.  Break more easily.  Heal more slowly.  Peel and crack. You may also have nerve damage (neuropathy) in your legs and feet, causing decreased feeling in them. This means that you may not notice minor injuries to your feet that could lead to more serious problems. Noticing and addressing any potential problems early is the best way to prevent future foot problems. How to care for your feet Foot hygiene  Wash your feet daily with warm water and mild soap. Do not use hot water. Then, pat your feet and the areas between your toes until they are completely dry. Do not soak your feet as this can dry your skin.  Trim your toenails straight across. Do not dig under them or around the cuticle. File the edges of your nails with an emery board or nail file.  Apply a moisturizing lotion or petroleum jelly to the skin on your feet and to dry, brittle toenails. Use lotion that does not contain alcohol and is unscented. Do not apply lotion between your toes. Shoes and socks  Wear clean socks or stockings every day. Make sure they are not too tight. Do not wear knee-high stockings since they may decrease blood flow to your legs.  Wear shoes that fit properly and have enough cushioning. Always look in your shoes before you put them on to be sure there are no objects inside.  To break in new shoes, wear them for just a few hours a day. This prevents injuries on your feet. Wounds, scrapes, corns, and calluses  Check your feet daily for blisters, cuts, bruises, sores, and redness. If you cannot see the bottom of your feet, use a mirror or ask someone for help.  Do not cut corns or calluses or try to remove them with medicine.  If you  find a minor scrape, cut, or break in the skin on your feet, keep it and the skin around it clean and dry. You may clean these areas with mild soap and water. Do not clean the area with peroxide, alcohol, or iodine.  If you have a wound, scrape, corn, or callus on your foot, look at it several times a day to make sure it is healing and not infected. Check for: ? Redness, swelling, or pain. ? Fluid or blood. ? Warmth. ? Pus or a bad smell. General instructions  Do not cross your legs. This may decrease blood flow to your feet.  Do not use heating pads or hot water bottles on your feet. They may burn your skin. If you have lost feeling in your feet or legs, you may not know this is happening until it is too late.  Protect your feet from hot and cold by wearing shoes, such as at the beach or on hot pavement.  Schedule a complete foot exam at least once a year (annually) or more often if you have foot problems. If you have foot problems, report any cuts, sores, or bruises to your health care provider immediately. Contact a health care provider if:  You have a medical condition that increases your risk of infection and you have any cuts, sores, or bruises on your feet.  You have an injury that is not   healing.  You have redness on your legs or feet.  You feel burning or tingling in your legs or feet.  You have pain or cramps in your legs and feet.  Your legs or feet are numb.  Your feet always feel cold.  You have pain around a toenail. Get help right away if:  You have a wound, scrape, corn, or callus on your foot and: ? You have pain, swelling, or redness that gets worse. ? You have fluid or blood coming from the wound, scrape, corn, or callus. ? Your wound, scrape, corn, or callus feels warm to the touch. ? You have pus or a bad smell coming from the wound, scrape, corn, or callus. ? You have a fever. ? You have a red line going up your leg. Summary  Check your feet every day  for cuts, sores, red spots, swelling, and blisters.  Moisturize feet and legs daily.  Wear shoes that fit properly and have enough cushioning.  If you have foot problems, report any cuts, sores, or bruises to your health care provider immediately.  Schedule a complete foot exam at least once a year (annually) or more often if you have foot problems. This information is not intended to replace advice given to you by your health care provider. Make sure you discuss any questions you have with your health care provider. Document Revised: 05/07/2019 Document Reviewed: 09/15/2016 Elsevier Patient Education  2020 Elsevier Inc.  

## 2019-11-21 NOTE — Progress Notes (Signed)
Subjective: Anthony [name] presents today for follow up of preventative diabetic foot care and callus(es) plantarly b/l feet and painful mycotic toenails b/l that are difficult to trim. Pain interferes with ambulation. Aggravating factors include wearing enclosed shoe gear. Pain is relieved with periodic professional debridement.   Today, he is requesting new diabetic inserts.    No Known Allergies   Objective: Vitals:   11/18/19 1058  Temp: (!) 97.2 F (36.2 C)    Pt 74 y.o. year old Caucasian male  in NAD. AAO x 3.   Vascular Examination:  Capillary refill time to digits immediate b/l. Palpable DP pulses b/l. Palpable PT pulses b/l. Pedal hair absent b/l Skin temperature gradient within normal limits b/l.  Dermatological Examination: Pedal skin with normal turgor, texture and tone bilaterally. No open wounds bilaterally. No interdigital macerations bilaterally. Toenails 1-5 b/l elongated, dystrophic, thickened, crumbly with subungual debris and tenderness to dorsal palpation. Hyperkeratotic lesion(s) submet head 1 left foot and submet head 1 right foot.  No erythema, no edema, no drainage, no flocculence.  Musculoskeletal: Normal muscle strength 5/5 to all lower extremity muscle groups bilaterally, no pain crepitus or joint limitation noted with ROM b/l, bunion deformity noted b/l, hammertoes noted to the  2-5 bilaterally and patient ambulates independent of any assistive aids.  Neurological: Protective sensation intact 5/5 intact bilaterally with 10g monofilament b/l Protective sensation diminished with 10g monofilament b/l. Vibratory sensation absent b/l  Assessment: 1. Pain due to onychomycosis of toenails of both feet   2. Callus   3. Diabetic peripheral neuropathy associated with type 2 diabetes mellitus ([place])    Plan: -Continue diabetic foot care principles. Literature dispensed on today.  -Toenails 1-5 b/l were debrided in length and girth with sterile nail nippers and  dremel without iatrogenic bleeding.  -Callus(es) submet head 1 left foot and submet head 1 right foot were debrided without complication or incident. Total number debrided =2. -Patient to continue soft, supportive shoe gear daily. Start procedure for diabetic shoes. Patient qualifies based on diagnoses. -Patient to report any pedal injuries to medical professional immediately. -Patient/POA to call should there be question/concern in the interim.  Return in about 3 months (around 02/18/2020).

## 2019-11-25 ENCOUNTER — Ambulatory Visit: Payer: Medicare HMO | Attending: Internal Medicine

## 2019-11-25 DIAGNOSIS — Z23 Encounter for immunization: Secondary | ICD-10-CM

## 2019-11-25 NOTE — Progress Notes (Signed)
   Covid-19 Vaccination Clinic  Name:  Anthony [name]    MRN: [id number] DOB: Mar 15, 1946  11/25/2019  Anthony [name] was observed post Covid-19 immunization for 15 minutes without incident. He was provided with Vaccine Information Sheet and instruction to access the V-Safe system.   Anthony [name] was instructed to call 911 with any severe reactions post vaccine: [name] Kitchen Difficulty breathing  . Swelling of face and throat  . A fast heartbeat  . A bad rash all over body  . Dizziness and weakness   Immunizations Administered    Name Date Dose VIS Date Route   Pfizer COVID-19 Vaccine 11/25/2019 11:55 AM 0.3 mL 08/08/2019 Intramuscular   Manufacturer: [place]   Lot: [id number]   [place]: [id number]

## 2019-12-06 ENCOUNTER — Other Ambulatory Visit: Payer: Self-pay | Admitting: Internal Medicine

## 2019-12-19 ENCOUNTER — Telehealth: Payer: Self-pay | Admitting: Family Medicine

## 2019-12-19 ENCOUNTER — Other Ambulatory Visit: Payer: Self-pay | Admitting: Family Medicine

## 2019-12-19 NOTE — Telephone Encounter (Signed)
Noted  

## 2019-12-19 NOTE — Telephone Encounter (Signed)
Called and got patient scheduled for CPE, AWV and labs. 

## 2019-12-19 NOTE — Telephone Encounter (Signed)
E-scribed refills.  Plz schedule wellness and lab visits.

## 2020-01-09 ENCOUNTER — Other Ambulatory Visit: Payer: Self-pay | Admitting: Family Medicine

## 2020-01-09 DIAGNOSIS — E113299 Type 2 diabetes mellitus with mild nonproliferative diabetic retinopathy without macular edema, unspecified eye: Secondary | ICD-10-CM

## 2020-01-09 DIAGNOSIS — Z794 Long term (current) use of insulin: Secondary | ICD-10-CM

## 2020-01-09 DIAGNOSIS — N401 Enlarged prostate with lower urinary tract symptoms: Secondary | ICD-10-CM

## 2020-01-09 DIAGNOSIS — N138 Other obstructive and reflux uropathy: Secondary | ICD-10-CM

## 2020-01-09 DIAGNOSIS — E785 Hyperlipidemia, unspecified: Secondary | ICD-10-CM

## 2020-01-12 ENCOUNTER — Other Ambulatory Visit (INDEPENDENT_AMBULATORY_CARE_PROVIDER_SITE_OTHER): Payer: Medicare HMO

## 2020-01-12 ENCOUNTER — Other Ambulatory Visit: Payer: Self-pay

## 2020-01-12 ENCOUNTER — Ambulatory Visit (INDEPENDENT_AMBULATORY_CARE_PROVIDER_SITE_OTHER): Payer: Medicare HMO

## 2020-01-12 DIAGNOSIS — E113299 Type 2 diabetes mellitus with mild nonproliferative diabetic retinopathy without macular edema, unspecified eye: Secondary | ICD-10-CM | POA: Diagnosis not present

## 2020-01-12 DIAGNOSIS — N401 Enlarged prostate with lower urinary tract symptoms: Secondary | ICD-10-CM

## 2020-01-12 DIAGNOSIS — N138 Other obstructive and reflux uropathy: Secondary | ICD-10-CM | POA: Diagnosis not present

## 2020-01-12 DIAGNOSIS — Z0000 Encounter for general adult medical examination without abnormal findings: Secondary | ICD-10-CM

## 2020-01-12 DIAGNOSIS — Z794 Long term (current) use of insulin: Secondary | ICD-10-CM

## 2020-01-12 DIAGNOSIS — E785 Hyperlipidemia, unspecified: Secondary | ICD-10-CM | POA: Diagnosis not present

## 2020-01-12 DIAGNOSIS — R69 Illness, unspecified: Secondary | ICD-10-CM | POA: Diagnosis not present

## 2020-01-12 LAB — LIPID PANEL
Cholesterol: 119 mg/dL (ref 0–200)
HDL: 37.7 mg/dL — ABNORMAL LOW (ref >39.00)
LDL Cholesterol: 58 mg/dL (ref 0–99)
NonHDL: 80.96
Total CHOL/HDL Ratio: 3
Triglycerides: 116 mg/dL (ref 0.0–149.0)
VLDL: 23.2 mg/dL (ref 0.0–40.0)

## 2020-01-12 LAB — COMPREHENSIVE METABOLIC PANEL
ALT: 13 U/L (ref 0–53)
AST: 15 U/L (ref 0–37)
Albumin: 4.1 g/dL (ref 3.5–5.2)
Alkaline Phosphatase: 93 U/L (ref 39–117)
BUN: 15 mg/dL (ref 6–23)
CO2: 32 mEq/L (ref 19–32)
Calcium: 9 mg/dL (ref 8.4–10.5)
Chloride: 104 mEq/L (ref 96–112)
Creatinine, Ser: 0.91 mg/dL (ref 0.40–1.50)
GFR: 81.99 mL/min (ref >60.00)
Glucose, Bld: 70 mg/dL (ref 70–99)
Potassium: 3.5 mEq/L (ref 3.5–5.1)
Sodium: 143 mEq/L (ref 135–145)
Total Bilirubin: 0.8 mg/dL (ref 0.2–1.2)
Total Protein: 6 g/dL (ref 6.0–8.3)

## 2020-01-12 LAB — HEMOGLOBIN A1C: Hgb A1c MFr Bld: 7.8 % — ABNORMAL HIGH (ref 4.6–6.5)

## 2020-01-12 NOTE — Progress Notes (Signed)
Subjective:   Anthony [name] is a 74 y.o. male who presents for Medicare Annual/Subsequent preventive examination.  Review of Systems: N/A   I connected with the patient today by telephone and verified that I am speaking with the correct person using two identifiers. Location patient: home Location nurse: work Persons participating in the virtual visit: patient, Marine scientist.   I discussed the limitations, risks, security and privacy concerns of performing an evaluation and management service by telephone and the availability of in person appointments. I also discussed with the patient that there may be a patient responsible charge related to this service. The patient expressed understanding and verbally consented to this telephonic visit.    Interactive audio and video telecommunications were attempted between this nurse and patient, however failed, due to patient having technical difficulties OR patient did not have access to video capability.  We continued and completed visit with audio only.     Cardiac Risk Factors include: advanced age (>25men, >43 women);male gender;diabetes mellitus;hypertension;dyslipidemia     Objective:    Vitals: There were no vitals taken for this visit.  There is no height or weight on file to calculate BMI.  Advanced Directives 01/12/2020 01/09/2019 02/20/2018 01/01/2018 12/29/2016 07/28/2013 07/28/2013  Does Patient Have a Medical Advance Directive? Yes Yes Yes Yes Yes Patient has advance directive, copy not in chart -  Type of Advance Directive [place];Living will [place];Living will Living will;Healthcare Power of [place];Living will Living will Living will Living will  Does patient want to make changes to medical advance directive? - - No - Patient declined - - No -  Copy of [place] in Chart? No - copy requested No - copy requested No - copy requested No - copy requested  - Copy requested from family Copy requested from family  Pre-existing out of facility DNR order (yellow form or pink MOST form) - - - - - No No    Tobacco Social History   Tobacco Use  Smoking Status Former Smoker  . Packs/day: 0.30  . Years: 50.00  . Pack years: 15.00  . Types: Cigarettes  . Quit date: 08/29/2011  . Years since quitting: 8.3  Smokeless Tobacco Never Used     Counseling given: Not Answered   Clinical Intake:  Pre-visit preparation completed: Yes  Pain : 0-10 Pain Score: 5  Pain Type: Chronic pain Pain Location: Leg(left arm numbness) Pain Orientation: Left, Right Pain Descriptors / Indicators: Aching, Numbness Pain Onset: More than a month ago Pain Frequency: Intermittent     Nutritional Risks: None Diabetes: Yes CBG done?: No Did pt. bring in CBG monitor from home?: No  How often do you need to have someone help you when you read instructions, pamphlets, or other written materials from your doctor or pharmacy?: 1 - Never What is the last grade level you completed in school?: 11th  Interpreter Needed?: No  Information entered by :: CJohnson, LPN  Past Medical History:  Diagnosis Date  . Anxiety   . Arthritis    a little  . Barrett's esophagus 12/08/2015   By EGD 2010 [name])   . BPH with obstruction/lower urinary tract symptoms 2014   s/p TURP  . Depression   . DKA (diabetic ketoacidoses) ([place]) 01/2010   "in coma"  . GERD (gastroesophageal reflux disease)   . H/O bronchitis    after every cold  . HTN (hypertension)   . Hyperlipidemia  controlled with medicine  . Otitis media   . Rheumatic fever    maybe as child  . 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 ABI  12/2013   WNL  . APPENDECTOMY    . CHOLECYSTECTOMY     years ago  . COLONOSCOPY  01/2009   TA, rec rpt 5 yrs [name])  . COLONOSCOPY   01/2018   TAx2, diverticulosis, int hemorrhoids, rpt 5 yrs ([name])  . ESOPHAGOGASTRODUODENOSCOPY  01/2009   biopsy with Barrett's  . ESOPHAGOGASTRODUODENOSCOPY  01/2018   WNL - no barrett's ([name])  . LAPAROSCOPIC APPENDECTOMY  07/05/2011   Procedure: APPENDECTOMY LAPAROSCOPIC;  Surgeon: [name], MD;  Location: [facility];  Service: General;  Laterality: N/A;  . MENISCUS REPAIR Right 2018   [name] @ [facility]  . TRANSURETHRAL RESECTION OF PROSTATE N/A 07/28/2013   Procedure: TRANSURETHRAL RESECTION OF THE PROSTATE WITH GYRUS INSTRUMENTS;  Surgeon: [name], MD  . VASECTOMY    . WISDOM TOOTH EXTRACTION     Family History  Problem Relation Age of Onset  . Lymphoma Mother        Non-hodgkins  . Coronary artery disease Mother   . Heart disease Mother   . Cancer Father   . Alcohol abuse Father   . Diabetes Brother   . Pancreatic cancer Maternal Uncle   . Diabetes Brother   . Diabetes Brother   . Diabetes Other   . Colon cancer Neg Hx   . Lung cancer Neg Hx   . Esophageal cancer Neg Hx   . Rectal cancer Neg Hx   . Stomach cancer Neg Hx    Social History   Socioeconomic History  . Marital status: Married    Spouse name: Not on file  . Number of children: Not on file  . Years of education: Not on file  . Highest education level: Not on file  Occupational History  . Not on file  Tobacco Use  . Smoking status: Former Smoker    Packs/day: 0.30    Years: 50.00    Pack years: 15.00    Types: Cigarettes    Quit date: 08/29/2011    Years since quitting: 8.3  . Smokeless tobacco: Never Used  Substance and Sexual Activity  . Alcohol use: Yes    Alcohol/week: 4.0 - 5.0 standard drinks    Types: 4 - 5 Standard drinks or equivalent per week  . Drug use: No  . Sexual activity: Not Currently  Other Topics Concern  . Not on file  Social History Narrative   MD ROSTER:   GI [name]   GS - Young      Daily Caffeine Use:  2   Owner/operater of dental  lab-sold, but continues to work. Fully retired.   Married 1969   2 daughters 1972, 1976; 1 son 31   7 grandchildren   Edu: 10th Grade   Hobby: car restoration: has '66 Vette, two classic Chevelle SS's and is restoring a '37 chevy coup   Regular Exercise -  NO   Diet: good water, fruits/vegetables daily   Social Determinants of Health   Financial Resource Strain: Low Risk   . Difficulty of Paying Living Expenses: Not hard at all  Food Insecurity: No Food Insecurity  . Worried About Charity fundraiser in the Last Year: Never true  .  Ran Out of Food in the Last Year: Never true  Transportation Needs: No Transportation Needs  . Lack of Transportation (Medical): No  . Lack of Transportation (Non-Medical): No  Physical Activity: Inactive  . Days of Exercise per Week: 0 days  . Minutes of Exercise per Session: 0 min  Stress: No Stress Concern Present  . Feeling of Stress : Not at all  Social Connections:   . Frequency of Communication with Friends and Family:   . Frequency of Social Gatherings with Friends and Family:   . Attends Religious Services:   . Active Member of Clubs or Organizations:   . Attends Archivist Meetings:   [name] Kitchen Marital Status:     Outpatient Encounter Medications as of 01/12/2020  Medication Sig  . amLODipine (NORVASC) 5 MG tablet TAKE 1 TABLET BY MOUTH EVERY DAY  . Dextrose, Diabetic Use, (GLUCOSE PO) Take 1 tablet by mouth as needed.  [name] Kitchen glucagon (GLUCAGON EMERGENCY) 1 MG injection Inject 1 mg into the muscle once as needed for up to 1 dose.  . insulin aspart (NOVOLOG FLEXPEN) 100 UNIT/ML FlexPen Inject 10-18 Units into the skin 3 (three) times daily with meals.  . Insulin Pen Needle (ADVOCATE INSULIN PEN NEEDLES) 31G X 5 MM MISC Use 3x a day  . metFORMIN (GLUCOPHAGE-XR) 500 MG 24 hr tablet Take 2 tablets (1,000 mg total) by mouth daily. TAKE 2 TABLETS BY MOUTH TWICE DAILY WITH MEALS  . olmesartan-hydrochlorothiazide (BENICAR HCT) 40-25 MG tablet TAKE  1 TABLET BY MOUTH EVERY DAY  . ONETOUCH ULTRA test strip USE AS INSTRUCTED 4X A DAY  . pantoprazole (PROTONIX) 40 MG tablet TAKE 1 TABLET BY MOUTH EVERY DAY  . potassium chloride (KLOR-CON M10) 10 MEQ tablet Take 1 tablet (10 mEq total) by mouth 2 (two) times daily.  . sildenafil (REVATIO) 20 MG tablet Take 2-4 tablets (40-80 mg total) by mouth daily as needed (relations).  . sildenafil (VIAGRA) 100 MG tablet Take 0.5-1 tablets (50-100 mg total) by mouth daily as needed for erectile dysfunction.  . simvastatin (ZOCOR) 20 MG tablet TAKE 1 TABLET BY MOUTH EVERYDAY AT BEDTIME  . TRESIBA FLEXTOUCH 200 UNIT/ML FlexTouch Pen INJECT 32 UNITS INTO THE SKIN DAILY.   No facility-administered encounter medications on file as of 01/12/2020.    Activities of Daily Living In your present state of health, do you have any difficulty performing the following activities: 01/12/2020  Hearing? N  Vision? N  Walking or climbing stairs? N  Dressing or bathing? N  Doing errands, shopping? N  Preparing Food and eating ? N  Using the Toilet? N  In the past six months, have you accidently leaked urine? N  Do you have problems with loss of bowel control? Y  Comment sometimes, not frequently  Managing your Medications? N  Managing your Finances? N  Housekeeping or managing your Housekeeping? N  Some recent data might be hidden    Patient Care Team: [name], MD as PCP - General (Family Medicine) [name], MD (Neurosurgery) [name], MD as Consulting Physician (General Surgery) [name], MD as Consulting Physician (Urology) Thelma Comp, [place] as Consulting Physician (Optometry)   Assessment:   This is a routine wellness examination for [place].  Exercise Activities and Dietary recommendations Current Exercise Habits: The patient does not participate in regular exercise at present, Exercise limited by: None identified  Goals    . Patient Stated     Starting 01/09/2019, I will  continue to taking medication  as prescribed.     . Patient Stated     01/12/2020, I will maintain and continue medications as prescribed.       Fall Risk Fall Risk  01/12/2020 01/09/2019 01/01/2018 12/29/2016 07/05/2015  Falls in the past year? 0 0 No No No  Number falls in past yr: 0 - - - -  Injury with Fall? 0 - - - -  Risk for fall due to : Medication side effect - - - -  Follow up Falls evaluation completed;Falls prevention discussed - - - -   Is the patient's home free of loose throw rugs in walkways, pet beds, electrical cords, etc?   yes      Grab bars in the bathroom? yes      Handrails on the stairs?   yes      Adequate lighting?   yes  Timed Get Up and Go Performed: N/A  Depression Screen PHQ 2/9 Scores 01/12/2020 01/09/2019 01/01/2018 12/29/2016  PHQ - 2 Score 0 0 0 0  PHQ- 9 Score 0 0 0 -    Cognitive Function MMSE - Mini Mental State Exam 01/12/2020 01/09/2019 01/01/2018 12/29/2016  Orientation to time 5 5 5 5   Orientation to Place 5 5 5 5   Registration 3 3 3 3   Attention/ Calculation 5 0 0 0  Recall 3 3 3 3   Language- name 2 objects - 0 0 0  Language- repeat 1 1 1 1   Language- follow 3 step command - 0 3 3  Language- read & follow direction - 0 0 0  Write a sentence - 0 0 0  Copy design - 0 0 0  Total score - 17 20 20   Mini Cog  Mini-Cog screen was completed. Maximum score is 22. A value of 0 denotes this part of the MMSE was not completed or the patient failed this part of the Mini-Cog screening.       Immunization History  Administered Date(s) Administered  . Influenza Whole 06/02/2008, 06/02/2009, 06/02/2010, 07/08/2011, 07/08/2012  . Influenza, High Dose Seasonal PF 05/18/2017, 06/25/2018, 04/18/2019  . Influenza,inj,Quad PF,6+ Mos 07/05/2015  . Influenza-Unspecified 05/28/2013, 05/28/2014, 06/30/2016  . PFIZER SARS-COV-2 Vaccination 10/27/2019, 11/25/2019  . Pneumococcal Conjugate-13 06/18/2013  . Pneumococcal Polysaccharide-23 07/08/2011, 12/29/2016  .  Tetanus 06/18/2013  . Zoster 08/26/2014  . Zoster Recombinat (Shingrix) 05/18/2017, 01/14/2018    Qualifies for Shingles Vaccine: Completed series   Screening Tests Health Maintenance  Topic Date Due  . DTAP VACCINES (1) Never done  . HEMOGLOBIN A1C  11/19/2019  . DTaP/Tdap/Td (1 - Tdap) 06/19/2023 (Originally 06/13/1965)  . INFLUENZA VACCINE  03/28/2020  . FOOT EXAM  05/18/2020  . OPHTHALMOLOGY EXAM  07/15/2020  . COLONOSCOPY  02/21/2023  . TETANUS/TDAP  06/19/2023  . COVID-19 Vaccine  Completed  . Hepatitis C Screening  Completed  . PNA vac Low Risk Adult  Completed   Cancer Screenings: Lung: Low Dose CT Chest recommended if Age 28-80 years, 30 pack-year currently smoking OR have quit w/in 15 years. Patient does not qualify. Colorectal: completed 02/20/2018  Additional Screenings:  Hepatitis C Screening: 01/05/2017      Plan:    Patient will maintain and continue medications as prescribed.   I have personally reviewed and noted the following in the patient's chart:   . Medical and social history . Use of alcohol, tobacco or illicit drugs  . Current medications and supplements . Functional ability and status . Nutritional status . Physical activity . Advanced directives .  List of other physicians . Hospitalizations, surgeries, and ER visits in previous 12 months . Vitals . Screenings to include cognitive, depression, and falls . Referrals and appointments  In addition, I have reviewed and discussed with patient certain preventive protocols, quality metrics, and best practice recommendations. A written personalized care plan for preventive services as well as general preventive health recommendations were provided to patient.     Andrez Grime, LPN  [phone]

## 2020-01-12 NOTE — Progress Notes (Signed)
PCP notes:  Health Maintenance: No gaps noted   Abnormal Screenings: none   Patient concerns: Left arm and hand numbness   Nurse concerns: none   Next PCP appt.: 01/20/2020 @ 11:30 am

## 2020-01-12 NOTE — Patient Instructions (Signed)
[name] , Thank you for taking time to come for your Medicare Wellness Visit. I appreciate your ongoing commitment to your health goals. Please review the following plan we discussed and let me know if I can assist you in the future.   Screening recommendations/referrals: Colonoscopy: Up to date, completed 02/20/2018 Recommended yearly ophthalmology/optometry visit for glaucoma screening and checkup Recommended yearly dental visit for hygiene and checkup  Vaccinations: Influenza vaccine: Up to date, completed 04/18/2019 Pneumococcal vaccine: Completed series Tdap vaccine: decline Shingles vaccine: Completed series    Advanced directives: Please bring a copy of your POA (Power of Attorney) and/or Living Will to your next appointment.   Conditions/risks identified: diabetes, hypertension, hyperlipidemia  Next appointment: 01/20/2020 @ 11:30 am   Preventive Care 65 Years and Older, Male Preventive care refers to lifestyle choices and visits with your health care provider that can promote health and wellness. What does preventive care include?  A yearly physical exam. This is also called an annual well check.  Dental exams once or twice a year.  Routine eye exams. Ask your health care provider how often you should have your eyes checked.  Personal lifestyle choices, including:  Daily care of your teeth and gums.  Regular physical activity.  Eating a healthy diet.  Avoiding tobacco and drug use.  Limiting alcohol use.  Practicing safe sex.  Taking low doses of aspirin every day.  Taking vitamin and mineral supplements as recommended by your health care provider. What happens during an annual well check? The services and screenings done by your health care provider during your annual well check will depend on your age, overall health, lifestyle risk factors, and family history of disease. Counseling  Your health care provider may ask you questions about your:  Alcohol  use.  Tobacco use.  Drug use.  Emotional well-being.  Home and relationship well-being.  Sexual activity.  Eating habits.  History of falls.  Memory and ability to understand (cognition).  Work and work Statistician. Screening  You may have the following tests or measurements:  Height, weight, and BMI.  Blood pressure.  Lipid and cholesterol levels. These may be checked every 5 years, or more frequently if you are over 35 years old.  Skin check.  Lung cancer screening. You may have this screening every year starting at age 72 if you have a 30-pack-year history of smoking and currently smoke or have quit within the past 15 years.  Fecal occult blood test (FOBT) of the stool. You may have this test every year starting at age 21.  Flexible sigmoidoscopy or colonoscopy. You may have a sigmoidoscopy every 5 years or a colonoscopy every 10 years starting at age 72.  Prostate cancer screening. Recommendations will vary depending on your family history and other risks.  Hepatitis C blood test.  Hepatitis B blood test.  Sexually transmitted disease (STD) testing.  Diabetes screening. This is done by checking your blood sugar (glucose) after you have not eaten for a while (fasting). You may have this done every 1-3 years.  Abdominal aortic aneurysm (AAA) screening. You may need this if you are a current or former smoker.  Osteoporosis. You may be screened starting at age 50 if you are at high risk. Talk with your health care provider about your test results, treatment options, and if necessary, the need for more tests. Vaccines  Your health care provider may recommend certain vaccines, such as:  Influenza vaccine. This is recommended every year.  Tetanus, diphtheria,  and acellular pertussis (Tdap, Td) vaccine. You may need a Td booster every 10 years.  Zoster vaccine. You may need this after age 34.  Pneumococcal 13-valent conjugate (PCV13) vaccine. One dose is  recommended after age 61.  Pneumococcal polysaccharide (PPSV23) vaccine. One dose is recommended after age 37. Talk to your health care provider about which screenings and vaccines you need and how often you need them. This information is not intended to replace advice given to you by your health care provider. Make sure you discuss any questions you have with your health care provider. Document Released: 09/10/2015 Document Revised: 05/03/2016 Document Reviewed: 06/15/2015 Elsevier Interactive Patient Education  2017 [place] Prevention in the Home Falls can cause injuries. They can happen to people of all ages. There are many things you can do to make your home safe and to help prevent falls. What can I do on the outside of my home?  Regularly fix the edges of walkways and driveways and fix any cracks.  Remove anything that might make you trip as you walk through a door, such as a raised step or threshold.  Trim any bushes or trees on the path to your home.  Use bright outdoor lighting.  Clear any walking paths of anything that might make someone trip, such as rocks or tools.  Regularly check to see if handrails are loose or broken. Make sure that both sides of any steps have handrails.  Any raised decks and porches should have guardrails on the edges.  Have any leaves, snow, or ice cleared regularly.  Use sand or salt on walking paths during winter.  Clean up any spills in your garage right away. This includes oil or grease spills. What can I do in the bathroom?  Use night lights.  Install grab bars by the toilet and in the tub and shower. Do not use towel bars as grab bars.  Use non-skid mats or decals in the tub or shower.  If you need to sit down in the shower, use a plastic, non-slip stool.  Keep the floor dry. Clean up any water that spills on the floor as soon as it happens.  Remove soap buildup in the tub or shower regularly.  Attach bath mats  securely with double-sided non-slip rug tape.  Do not have throw rugs and other things on the floor that can make you trip. What can I do in the bedroom?  Use night lights.  Make sure that you have a light by your bed that is easy to reach.  Do not use any sheets or blankets that are too big for your bed. They should not hang down onto the floor.  Have a firm chair that has side arms. You can use this for support while you get dressed.  Do not have throw rugs and other things on the floor that can make you trip. What can I do in the kitchen?  Clean up any spills right away.  Avoid walking on wet floors.  Keep items that you use a lot in easy-to-reach places.  If you need to reach something above you, use a strong step stool that has a grab bar.  Keep electrical cords out of the way.  Do not use floor polish or wax that makes floors slippery. If you must use wax, use non-skid floor wax.  Do not have throw rugs and other things on the floor that can make you trip. What can I do with my  stairs?  Do not leave any items on the stairs.  Make sure that there are handrails on both sides of the stairs and use them. Fix handrails that are broken or loose. Make sure that handrails are as long as the stairways.  Check any carpeting to make sure that it is firmly attached to the stairs. Fix any carpet that is loose or worn.  Avoid having throw rugs at the top or bottom of the stairs. If you do have throw rugs, attach them to the floor with carpet tape.  Make sure that you have a light switch at the top of the stairs and the bottom of the stairs. If you do not have them, ask someone to add them for you. What else can I do to help prevent falls?  Wear shoes that:  Do not have high heels.  Have rubber bottoms.  Are comfortable and fit you well.  Are closed at the toe. Do not wear sandals.  If you use a stepladder:  Make sure that it is fully opened. Do not climb a closed  stepladder.  Make sure that both sides of the stepladder are locked into place.  Ask someone to hold it for you, if possible.  Clearly mark and make sure that you can see:  Any grab bars or handrails.  First and last steps.  Where the edge of each step is.  Use tools that help you move around (mobility aids) if they are needed. These include:  Canes.  Walkers.  Scooters.  Crutches.  Turn on the lights when you go into a dark area. Replace any light bulbs as soon as they burn out.  Set up your furniture so you have a clear path. Avoid moving your furniture around.  If any of your floors are uneven, fix them.  If there are any pets around you, be aware of where they are.  Review your medicines with your doctor. Some medicines can make you feel dizzy. This can increase your chance of falling. Ask your doctor what other things that you can do to help prevent falls. This information is not intended to replace advice given to you by your health care provider. Make sure you discuss any questions you have with your health care provider. Document Released: 06/10/2009 Document Revised: 01/20/2016 Document Reviewed: 09/18/2014 Elsevier Interactive Patient Education  2017 [employer].

## 2020-01-13 LAB — PSA: PSA: 2.39 ng/mL (ref 0.10–4.00)

## 2020-01-20 ENCOUNTER — Ambulatory Visit (INDEPENDENT_AMBULATORY_CARE_PROVIDER_SITE_OTHER): Payer: Medicare HMO | Admitting: Family Medicine

## 2020-01-20 ENCOUNTER — Ambulatory Visit
Admission: RE | Admit: 2020-01-20 | Discharge: 2020-01-20 | Disposition: A | Discharge status: Home / Self Care | Payer: Medicare HMO | Source: Ambulatory Visit | Attending: Family Medicine | Admitting: Family Medicine

## 2020-01-20 ENCOUNTER — Encounter: Payer: Self-pay | Admitting: Family Medicine

## 2020-01-20 ENCOUNTER — Other Ambulatory Visit: Payer: Self-pay

## 2020-01-20 VITALS — BP 116/68 | HR 77 | Temp 97.5°F | Ht 71.0 in | Wt 214.2 lb

## 2020-01-20 DIAGNOSIS — K219 Gastro-esophageal reflux disease without esophagitis: Secondary | ICD-10-CM | POA: Diagnosis not present

## 2020-01-20 DIAGNOSIS — Z0000 Encounter for general adult medical examination without abnormal findings: Secondary | ICD-10-CM

## 2020-01-20 DIAGNOSIS — N401 Enlarged prostate with lower urinary tract symptoms: Secondary | ICD-10-CM | POA: Diagnosis not present

## 2020-01-20 DIAGNOSIS — E113299 Type 2 diabetes mellitus with mild nonproliferative diabetic retinopathy without macular edema, unspecified eye: Secondary | ICD-10-CM | POA: Diagnosis not present

## 2020-01-20 DIAGNOSIS — I10 Essential (primary) hypertension: Secondary | ICD-10-CM | POA: Diagnosis not present

## 2020-01-20 DIAGNOSIS — Z7189 Other specified counseling: Secondary | ICD-10-CM

## 2020-01-20 DIAGNOSIS — K4020 Bilateral inguinal hernia, without obstruction or gangrene, not specified as recurrent: Secondary | ICD-10-CM | POA: Diagnosis not present

## 2020-01-20 DIAGNOSIS — Z794 Long term (current) use of insulin: Secondary | ICD-10-CM | POA: Diagnosis not present

## 2020-01-20 DIAGNOSIS — R200 Anesthesia of skin: Secondary | ICD-10-CM | POA: Diagnosis not present

## 2020-01-20 DIAGNOSIS — K2270 Barrett's esophagus without dysplasia: Secondary | ICD-10-CM | POA: Diagnosis not present

## 2020-01-20 DIAGNOSIS — R1031 Right lower quadrant pain: Secondary | ICD-10-CM

## 2020-01-20 DIAGNOSIS — E785 Hyperlipidemia, unspecified: Secondary | ICD-10-CM

## 2020-01-20 DIAGNOSIS — R202 Paresthesia of skin: Secondary | ICD-10-CM

## 2020-01-20 DIAGNOSIS — N138 Other obstructive and reflux uropathy: Secondary | ICD-10-CM

## 2020-01-20 DIAGNOSIS — Z87891 Personal history of nicotine dependence: Secondary | ICD-10-CM

## 2020-01-20 DIAGNOSIS — E1169 Type 2 diabetes mellitus with other specified complication: Secondary | ICD-10-CM | POA: Diagnosis not present

## 2020-01-20 NOTE — Patient Instructions (Addendum)
We will refer you to see if you're eligible for lung cancer screening program.  Advanced directive packets provided today  Call Dr Cruzita Lederer for follow up visit.  I'm suspicious for inguinal hernia causing pain - see our referral coordinator to schedule CT scan. No heavy lifting or exertion until we figure out this pain.  Try over the counter vitamin b12 1021mcg daily for 1 month and if ongoing numbness to left arm, schedule follow up sooner.  Return as needed or in 6 months for follow up visit.   Health Maintenance After Age 44 After age 42, you are at a higher risk for certain long-term diseases and infections as well as injuries from falls. Falls are a major cause of broken bones and head injuries in people who are older than age 29. Getting regular preventive care can help to keep you healthy and well. Preventive care includes getting regular testing and making lifestyle changes as recommended by your health care provider. Talk with your health care provider about:  Which screenings and tests you should have. A screening is a test that checks for a disease when you have no symptoms.  A diet and exercise plan that is right for you. What should I know about screenings and tests to prevent falls? Screening and testing are the best ways to find a health problem early. Early diagnosis and treatment give you the best chance of managing medical conditions that are common after age 83. Certain conditions and lifestyle choices may make you more likely to have a fall. Your health care provider may recommend:  Regular vision checks. Poor vision and conditions such as cataracts can make you more likely to have a fall. If you wear glasses, make sure to get your prescription updated if your vision changes.  Medicine review. Work with your health care provider to regularly review all of the medicines you are taking, including over-the-counter medicines. Ask your health care provider about any side effects that  may make you more likely to have a fall. Tell your health care provider if any medicines that you take make you feel dizzy or sleepy.  Osteoporosis screening. Osteoporosis is a condition that causes the bones to get weaker. This can make the bones weak and cause them to break more easily.  Blood pressure screening. Blood pressure changes and medicines to control blood pressure can make you feel dizzy.  Strength and balance checks. Your health care provider may recommend certain tests to check your strength and balance while standing, walking, or changing positions.  Foot health exam. Foot pain and numbness, as well as not wearing proper footwear, can make you more likely to have a fall.  Depression screening. You may be more likely to have a fall if you have a fear of falling, feel emotionally low, or feel unable to do activities that you used to do.  Alcohol use screening. Using too much alcohol can affect your balance and may make you more likely to have a fall. What actions can I take to lower my risk of falls? General instructions  Talk with your health care provider about your risks for falling. Tell your health care provider if: ? You fall. Be sure to tell your health care provider about all falls, even ones that seem minor. ? You feel dizzy, sleepy, or off-balance.  Take over-the-counter and prescription medicines only as told by your health care provider. These include any supplements.  Eat a healthy diet and maintain a healthy weight. A  healthy diet includes low-fat dairy products, low-fat (lean) meats, and fiber from whole grains, beans, and lots of fruits and vegetables. Home safety  Remove any tripping hazards, such as rugs, cords, and clutter.  Install safety equipment such as grab bars in bathrooms and safety rails on stairs.  Keep rooms and walkways well-lit. Activity   Follow a regular exercise program to stay fit. This will help you maintain your balance. Ask your  health care provider what types of exercise are appropriate for you.  If you need a cane or walker, use it as recommended by your health care provider.  Wear supportive shoes that have nonskid soles. Lifestyle  Do not drink alcohol if your health care provider tells you not to drink.  If you drink alcohol, limit how much you have: ? 0-1 drink a day for women. ? 0-2 drinks a day for men.  Be aware of how much alcohol is in your drink. In the [place] one drink equals one typical bottle of beer (12 oz), one-half glass of wine (5 oz), or one shot of hard liquor (1 oz).  Do not use any products that contain nicotine or tobacco, such as cigarettes and e-cigarettes. If you need help quitting, ask your health care provider. Summary  Having a healthy lifestyle and getting preventive care can help to protect your health and wellness after age 70.  Screening and testing are the best way to find a health problem early and help you avoid having a fall. Early diagnosis and treatment give you the best chance for managing medical conditions that are more common for people who are older than age 62.  Falls are a major cause of broken bones and head injuries in people who are older than age 62. Take precautions to prevent a fall at home.  Work with your health care provider to learn what changes you can make to improve your health and wellness and to prevent falls. This information is not intended to replace advice given to you by your health care provider. Make sure you discuss any questions you have with your health care provider. Document Revised: 12/05/2018 Document Reviewed: 06/27/2017 Elsevier Patient Education  2020 [employer].

## 2020-01-20 NOTE — Progress Notes (Signed)
This visit was conducted in person.  BP 116/68 (BP Location: Left Arm, Patient Position: Sitting, Cuff Size: Normal)   Pulse 77   Temp (!) 97.5 F (36.4 C) (Temporal)   Ht 5\' 11"  (1.803 m)   Wt 214 lb 4 oz (97.2 kg)   SpO2 97%   BMI 29.88 kg/m    CC: CPE Subjective:    Patient ID: Anthony [name], male    DOB: Oct 04, 1945, 74 y.o.   MRN: [id number]  HPI: Anthony [name] is a 74 y.o. male presenting on 01/20/2020 for Annual Exam (Prt 2. ) and Abdominal Pain (C/o RLQ abd pain.  Feels like it could be a hernia.  Started 1 mo ago. Pain occurs when working, movenment pulling in that area or cough/sneeze. )   Saw health advisor last week for medicare wellness visit. Note reviewed.    No exam data present    Clinical Support from 01/12/2020 in [place] at [facility] Total Score  0      Fall Risk  01/12/2020 01/09/2019 01/01/2018 12/29/2016 07/05/2015  Falls in the past year? 0 0 No No No  Number falls in past yr: 0 - - - -  Injury with Fall? 0 - - - -  Risk for fall due to : Medication side effect - - - -  Follow up Falls evaluation completed;Falls prevention discussed - - - -      1 mo h/o RLQ abd pain worse with activity or cough. He does heavy lifting /straining intermittently. Notices "bubble" in RLQ but no obvious bulge. No fevers/chills, nausea/vomiting, constipation, blood in stool or urine. Some diarrhea attributed to metformin. H/o appendicitis with rupture 2002.   Also notes some L arm numbness for 1-2 months. Noticeable when driving. Known chronic neck pain.   Preventative: COLONOSCOPY 01/2018 - TAx2, diverticulosis, int hemorrhoids, rpt 5 yrs ([name]). H/o Barrett's continues daily PPI Prostate cancer screening - s/p TURP for BPH. Released from [facility]. Normal voiding.Regular exams here in the past. Lung cancer screening - ~25PY hx over lifetime. Will refer to lung cancer screening program.  Flu shotyearly  Pneumovax 06/2011, 2018.  Prevnar10/2014 Tetanus - 05/2013 zostavax - 07/2014 shingrix -04/2017, 12/2017 COVID vaccine - completed pfizer 10/2019 Advanced planning - thinks has living will at home but unsure. Would want wife to be HCPOA. New packet provided today.  Seat belt use discussed  Sunscreen use discussed. No changing moles on skin. Ex smoker quit 2013, 1/2 ppd for about 50 yrs - unsure if eligible  Alcohol - a few mixed drinks/night  Dentist - due  Eye exam - overdue Bowel - no constipation - diarrhea with occasional fecal incontinence - has backed off metformin with some benefit. Bladder - no incontinence  Owner/operater of dental lab-sold. Fully retired.  Married 1969 2 daughters 1972, 26; 1 son 1974,7 grandchildren Edu: 10th Grade Hobby: car restoration: has '66 Vette, two classic Chevelle SS's and is restoring a '37 chevy coup Activity: works yard daily (11.5 acres), no regular exercise Diet: good water, fruits/vegetables daily     Relevant past medical, surgical, family and social history reviewed and updated as indicated. Interim medical history since our last visit reviewed. Allergies and medications reviewed and updated. Outpatient Medications Prior to Visit  Medication Sig Dispense Refill  . Dextrose, Diabetic Use, (GLUCOSE PO) Take 1 tablet by mouth as needed.    [name] Kitchen glucagon (GLUCAGON EMERGENCY) 1 MG injection Inject 1 mg into the muscle once as  needed for up to 1 dose. 1 each 12  . insulin aspart (NOVOLOG FLEXPEN) 100 UNIT/ML FlexPen Inject 10-18 Units into the skin 3 (three) times daily with meals. 45 mL 4  . Insulin Pen Needle (ADVOCATE INSULIN PEN NEEDLES) 31G X 5 MM MISC Use 3x a day 300 each 3  . metFORMIN (GLUCOPHAGE-XR) 500 MG 24 hr tablet Take 2 tablets (1,000 mg total) by mouth daily. TAKE 2 TABLETS BY MOUTH TWICE DAILY WITH MEALS 360 tablet 3  . ONETOUCH ULTRA test strip USE AS INSTRUCTED 4X A DAY 100 each 5  . potassium chloride (KLOR-CON M10) 10 MEQ tablet Take 1 tablet (10  mEq total) by mouth 2 (two) times daily. 60 tablet 3  . sildenafil (VIAGRA) 100 MG tablet Take 0.5-1 tablets (50-100 mg total) by mouth daily as needed for erectile dysfunction. 10 tablet 3  . TRESIBA FLEXTOUCH 200 UNIT/ML FlexTouch Pen INJECT 32 UNITS INTO THE SKIN DAILY. 9 pen 2  . amLODipine (NORVASC) 5 MG tablet TAKE 1 TABLET BY MOUTH EVERY DAY 90 tablet 0  . olmesartan-hydrochlorothiazide (BENICAR HCT) 40-25 MG tablet TAKE 1 TABLET BY MOUTH EVERY DAY 90 tablet 3  . pantoprazole (PROTONIX) 40 MG tablet TAKE 1 TABLET BY MOUTH EVERY DAY 90 tablet 0  . sildenafil (REVATIO) 20 MG tablet Take 2-4 tablets (40-80 mg total) by mouth daily as needed (relations). 30 tablet 3  . simvastatin (ZOCOR) 20 MG tablet TAKE 1 TABLET BY MOUTH EVERYDAY AT BEDTIME 90 tablet 3   No facility-administered medications prior to visit.     Per HPI unless specifically indicated in ROS section below Review of Systems  Constitutional: Negative for activity change, appetite change, chills, fatigue, fever and unexpected weight change.  HENT: Negative for hearing loss.        Dry throat/mouth cough  Eyes: Negative for visual disturbance.  Respiratory: Negative for cough, chest tightness, shortness of breath and wheezing.   Cardiovascular: Negative for chest pain, palpitations and leg swelling.  Gastrointestinal: Positive for abdominal pain (RLQ - see HPI) and diarrhea (metformin related). Negative for abdominal distention, blood in stool, constipation, nausea and vomiting.  Genitourinary: Negative for difficulty urinating and hematuria.  Musculoskeletal: Positive for arthralgias and neck pain. Negative for myalgias.  Skin: Negative for rash.  Neurological: Negative for dizziness, seizures, syncope and headaches.  Hematological: Negative for adenopathy. Bruises/bleeds easily.  Psychiatric/Behavioral: Negative for dysphoric mood. The patient is not nervous/anxious.    Objective:  BP 116/68 (BP Location: Left Arm,  Patient Position: Sitting, Cuff Size: Normal)   Pulse 77   Temp (!) 97.5 F (36.4 C) (Temporal)   Ht 5\' 11"  (1.803 m)   Wt 214 lb 4 oz (97.2 kg)   SpO2 97%   BMI 29.88 kg/m   Wt Readings from Last 3 Encounters:  01/20/20 214 lb 4 oz (97.2 kg)  05/22/19 215 lb (97.5 kg)  01/14/19 200 lb (90.7 kg)      Physical Exam Vitals and nursing note reviewed.  Constitutional:      General: He is not in acute distress.    Appearance: He is well-developed.  HENT:     Head: Normocephalic and atraumatic.     Right Ear: Hearing, tympanic membrane, ear canal and external ear normal.     Left Ear: Hearing, tympanic membrane, ear canal and external ear normal.     Nose: Nose normal.     Mouth/Throat:     Pharynx: Uvula midline. No oropharyngeal exudate or posterior oropharyngeal  erythema.  Eyes:     General: No scleral icterus.    Conjunctiva/sclera: Conjunctivae normal.     Pupils: Pupils are equal, round, and reactive to light.  Cardiovascular:     Rate and Rhythm: Normal rate and regular rhythm.     Pulses:          Radial pulses are 2+ on the right side and 2+ on the left side.     Heart sounds: Normal heart sounds. No murmur.  Pulmonary:     Effort: Pulmonary effort is normal. No respiratory distress.     Breath sounds: Normal breath sounds. No wheezing or rales.  Abdominal:     General: Bowel sounds are normal. There is no distension.     Palpations: Abdomen is soft. There is no mass.     Tenderness: There is no abdominal tenderness. There is no right CVA tenderness, left CVA tenderness, guarding or rebound.     Hernia: A hernia is present. Hernia is present in the right inguinal area (fullness to R inguinal region with reproducible tenderness). There is no hernia in the left inguinal area, right femoral area or left femoral area.  Genitourinary:    Penis: Normal.      Testes:        Right: Tenderness (mild discomfort to palpation) present. Mass or swelling not present.        Left:  Mass, tenderness or swelling not present.     Prostate: Normal. Not enlarged, not tender and no nodules present.     Rectum: Normal. No mass, tenderness, anal fissure, external hemorrhoid or internal hemorrhoid. Normal anal tone.  Musculoskeletal:        General: Normal range of motion.     Cervical back: Normal range of motion and neck supple.     Right lower leg: No edema.     Left lower leg: No edema.     Comments:  No groin pain with int/ext rotation at bilateral hips  Lymphadenopathy:     Cervical: No cervical adenopathy.     Lower Body: No right inguinal adenopathy. No left inguinal adenopathy.  Skin:    General: Skin is warm and dry.     Findings: No rash.  Neurological:     General: No focal deficit present.     Mental Status: He is alert and oriented to person, place, and time.     Comments:  CN grossly intact, station and gait intact Grip strength intact  Psychiatric:        Mood and Affect: Mood normal.        Behavior: Behavior normal.        Thought Content: Thought content normal.        Judgment: Judgment normal.       Results for orders placed or performed in visit on 01/12/20  PSA  Result Value Ref Range   PSA 2.39 0.10 - 4.00 ng/mL  Hemoglobin A1c  Result Value Ref Range   Hgb A1c MFr Bld 7.8 (H) 4.6 - 6.5 %  Comprehensive metabolic panel  Result Value Ref Range   Sodium 143 135 - 145 mEq/L   Potassium 3.5 3.5 - 5.1 mEq/L   Chloride 104 96 - 112 mEq/L   CO2 32 19 - 32 mEq/L   Glucose, Bld 70 70 - 99 mg/dL   BUN 15 6 - 23 mg/dL   Creatinine, Ser 0.91 0.40 - 1.50 mg/dL   Total Bilirubin 0.8 0.2 - 1.2 mg/dL  Alkaline Phosphatase 93 39 - 117 U/L   AST 15 0 - 37 U/L   ALT 13 0 - 53 U/L   Total Protein 6.0 6.0 - 8.3 g/dL   Albumin 4.1 3.5 - 5.2 g/dL   GFR 81.99 >60.00 mL/min   Calcium 9.0 8.4 - 10.5 mg/dL  Lipid panel  Result Value Ref Range   Cholesterol 119 0 - 200 mg/dL   Triglycerides 116.0 0.0 - 149.0 mg/dL   HDL 37.70 (L) >39.00 mg/dL    VLDL 23.2 0.0 - 40.0 mg/dL   LDL Cholesterol 58 0 - 99 mg/dL   Total CHOL/HDL Ratio 3    NonHDL 80.96   No results found for: VITAMINB12  Assessment & Plan:  This visit occurred during the SARS-CoV-2 public health emergency.  Safety protocols were in place, including screening questions prior to the visit, additional usage of staff PPE, and extensive cleaning of exam room while observing appropriate contact time as indicated for disinfecting solutions.   Problem List Items Addressed This Visit    RLQ abdominal pain    Suspicious for R inguinal hernia but no obvious hernia on exam - will check CT. He will avoid heavy lifting/straining until evaluated.       Relevant Orders   CT Abdomen Pelvis Wo Contrast (Completed)   Ambulatory referral to General Surgery   Numbness and tingling in left arm    Did not fully evaluate today. Suggested trial of B12 vitamin for 1 mo and if no improvement, return for re eval.       Hyperlipidemia associated with type 2 diabetes mellitus ([name])    LDL at goal on simvastatin.       Relevant Medications   simvastatin (ZOCOR) 20 MG tablet   olmesartan-hydrochlorothiazide (BENICAR HCT) 40-25 MG tablet   Health maintenance examination - Primary    Preventative protocols reviewed and updated unless pt declined. Discussed healthy diet and lifestyle.       GERD    Continues daily pantoprazole.       Relevant Medications   pantoprazole (PROTONIX) 40 MG tablet   Ex-smoker    Will refer to lung cancer screening program to see if eligible. Wife recently diagnosed with early stage lung cancer s/p resection.       Relevant Orders   Ambulatory Referral for Lung Cancer Scre   Essential hypertension    Chronic, stable. 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 Endorses brittle diabetes with episodes of hypoglycemia to 30s without awareness. Encouraged urgent endo f/u as overdue - he will call to schedule appointment.       Relevant Medications   simvastatin (ZOCOR) 20 MG tablet   olmesartan-hydrochlorothiazide (BENICAR HCT) 40-25 MG tablet   Benign prostatic hyperplasia with urinary obstruction    Chronic, stable. S/p TURP for BPH, released from uro care. No longer on flomax or proscar.       Barrett's esophagus    Continue daily PPI.       Advanced care planning/counseling discussion    Advanced planning - thinks has living will at home but unsure. Would want wife to be HCPOA. New packet provided today.           Meds ordered this encounter  Medications  .  pantoprazole (PROTONIX) 40 MG tablet    Sig: Take 1 tablet (40 mg total) by mouth daily.    Dispense:  90 tablet    Refill:  3  . simvastatin (ZOCOR) 20 MG tablet    Sig: TAKE 1 TABLET BY MOUTH EVERYDAY AT BEDTIME    Dispense:  90 tablet    Refill:  3  . amLODipine (NORVASC) 5 MG tablet    Sig: Take 1 tablet (5 mg total) by mouth daily.    Dispense:  90 tablet    Refill:  3  . olmesartan-hydrochlorothiazide (BENICAR HCT) 40-25 MG tablet    Sig: Take 1 tablet by mouth daily.    Dispense:  90 tablet    Refill:  3   Orders Placed This Encounter  Procedures  . CT Abdomen Pelvis Wo Contrast    [employer] [facility] @[facility]  Wt214/no needs/no to all covid q's/wear mask, come alone,ab spoke w/charmaine@[facility] /epic order    Standing Status:   Future    Number of Occurrences:   1    Standing Expiration Date:   01/19/2021    Order Specific Question:   ** REASON FOR EXAM (FREE TEXT)    Answer:   RLQ and groin pain eval for hernia    Order Specific Question:   Preferred imaging location?    Answer:   [facility]    Order Specific Question:   Is Oral Contrast requested for this exam?    Answer:   Yes, Per Radiology  protocol    Order Specific Question:   Call Results- Best Contact Number?    Answer:   [phone]    Order Specific Question:   Radiology Contrast Protocol - do NOT remove file path    Answer:   [email or web address]  . Ambulatory Referral for Lung Cancer Scre    Referral Priority:   Routine    Referral Type:   Consultation    Referral Reason:   Specialty Services Required    Number of Visits Requested:   1  . Ambulatory referral to General Surgery    Referral Priority:   Routine    Referral Type:   Surgical    Referral Reason:   Specialty Services Required    Requested Specialty:   General Surgery    Number of Visits Requested:   1    Patient instructions: We will refer you to see if you're eligible for lung cancer screening program.  Advanced directive packets provided today  Call Dr Cruzita Lederer for follow up visit.  I'm suspicious for inguinal hernia causing pain - see our referral coordinator to schedule CT scan. No heavy lifting or exertion until we figure out this pain.  Try over the counter vitamin b12 1015mcg daily for 1 month and if ongoing numbness to left arm, schedule follow up sooner.  Return as needed or in 6 months for follow up visit.   Follow up plan: Return in about 6 months (around 07/22/2020), or if symptoms worsen or fail to improve, for follow up visit.  [name], MD

## 2020-01-20 NOTE — Assessment & Plan Note (Signed)
Preventative protocols reviewed and updated unless pt declined. Discussed healthy diet and lifestyle.  

## 2020-01-21 DIAGNOSIS — R200 Anesthesia of skin: Secondary | ICD-10-CM | POA: Insufficient documentation

## 2020-01-21 DIAGNOSIS — R202 Paresthesia of skin: Secondary | ICD-10-CM | POA: Insufficient documentation

## 2020-01-21 DIAGNOSIS — R1031 Right lower quadrant pain: Secondary | ICD-10-CM | POA: Insufficient documentation

## 2020-01-21 DIAGNOSIS — Z87891 Personal history of nicotine dependence: Secondary | ICD-10-CM | POA: Insufficient documentation

## 2020-01-21 HISTORY — DX: Personal history of nicotine dependence: Z87.891

## 2020-01-21 MED ORDER — OLMESARTAN MEDOXOMIL-HCTZ 40-25 MG PO TABS: 1.0000 | ORAL_TABLET | Freq: Every day | ORAL | 3 refills | Status: DC

## 2020-01-21 MED ORDER — PANTOPRAZOLE SODIUM 40 MG PO TBEC: 40.0000 mg | DELAYED_RELEASE_TABLET | Freq: Every day | ORAL | 3 refills | Status: DC

## 2020-01-21 MED ORDER — SIMVASTATIN 20 MG PO TABS: ORAL_TABLET | ORAL | 3 refills | Status: DC

## 2020-01-21 MED ORDER — AMLODIPINE BESYLATE 5 MG PO TABS: 5.0000 mg | ORAL_TABLET | Freq: Every day | ORAL | 3 refills | Status: DC

## 2020-01-21 NOTE — Assessment & Plan Note (Signed)
Chronic, stable. S/p TURP for BPH, released from uro care. No longer on flomax or proscar.

## 2020-01-21 NOTE — Assessment & Plan Note (Addendum)
Chronic, stable. Continue current regimen. 

## 2020-01-21 NOTE — Assessment & Plan Note (Signed)
Continue daily PPI.  

## 2020-01-21 NOTE — Assessment & Plan Note (Signed)
Advanced planning - thinks has living will at home but unsure. Would want wife to be HCPOA. New packet provided today.

## 2020-01-21 NOTE — Assessment & Plan Note (Signed)
Chronic, uncontrolled. Endorses brittle diabetes with episodes of hypoglycemia to 30s without awareness. Encouraged urgent endo f/u as overdue - he will call to schedule appointment.

## 2020-01-21 NOTE — Assessment & Plan Note (Addendum)
Did not fully evaluate today. Suggested trial of B12 vitamin for 1 mo and if no improvement, return for re eval.

## 2020-01-21 NOTE — Assessment & Plan Note (Signed)
Continues daily pantoprazole.  

## 2020-01-21 NOTE — Assessment & Plan Note (Signed)
Will refer to lung cancer screening program to see if eligible. Wife recently diagnosed with early stage lung cancer s/p resection.

## 2020-01-21 NOTE — Assessment & Plan Note (Signed)
LDL at goal on simvastatin.

## 2020-01-21 NOTE — Assessment & Plan Note (Addendum)
Suspicious for R inguinal hernia but no obvious hernia on exam - will check CT. He will avoid heavy lifting/straining until evaluated.

## 2020-01-28 ENCOUNTER — Telehealth: Payer: Self-pay | Admitting: Family Medicine

## 2020-01-28 NOTE — Progress Notes (Signed)
  Chronic Care Management   Note  01/28/2020 Name: Anthony [name] MRN: [id number] DOB: Nov 19, 1945  Anthony [name] is a 74 y.o. year old male who is a primary care patient of [name], MD. I reached out to [name] by phone today in response to a referral sent by Mr. Leanor Kail Vallery's PCP, [name], MD.   Mr. Bielby was given information about Chronic Care Management services today including:  1. CCM service includes personalized support from designated clinical staff supervised by his physician, including individualized plan of care and coordination with other care providers 2. 24/7 contact phone numbers for assistance for urgent and routine care needs. 3. Service will only be billed when office clinical staff spend 20 minutes or more in a month to coordinate care. 4. Only one practitioner may furnish and bill the service in a calendar month. 5. The patient may stop CCM services at any time (effective at the end of the month) by phone call to the office staff.   Patient agreed to services and verbal consent obtained.   This note is not being shared with the patient for the following reason: To respect privacy (The patient or proxy has requested that the information not be shared).  Follow up plan:   Earney Hamburg Upstream Scheduler

## 2020-01-29 ENCOUNTER — Other Ambulatory Visit: Payer: Self-pay | Admitting: *Deleted

## 2020-01-29 DIAGNOSIS — Z87891 Personal history of nicotine dependence: Secondary | ICD-10-CM

## 2020-02-05 ENCOUNTER — Other Ambulatory Visit: Payer: Self-pay | Admitting: Internal Medicine

## 2020-02-18 ENCOUNTER — Other Ambulatory Visit: Payer: Self-pay

## 2020-02-18 ENCOUNTER — Ambulatory Visit: Payer: Medicare HMO | Admitting: Podiatry

## 2020-02-18 DIAGNOSIS — L84 Corns and callosities: Secondary | ICD-10-CM | POA: Diagnosis not present

## 2020-02-18 DIAGNOSIS — E1142 Type 2 diabetes mellitus with diabetic polyneuropathy: Secondary | ICD-10-CM

## 2020-02-18 DIAGNOSIS — M79674 Pain in right toe(s): Secondary | ICD-10-CM | POA: Diagnosis not present

## 2020-02-18 DIAGNOSIS — B351 Tinea unguium: Secondary | ICD-10-CM | POA: Diagnosis not present

## 2020-02-18 DIAGNOSIS — M79675 Pain in left toe(s): Secondary | ICD-10-CM | POA: Diagnosis not present

## 2020-02-18 NOTE — Patient Instructions (Signed)
Diabetes Mellitus and Foot Care Foot care is an important part of your health, especially when you have diabetes. Diabetes may cause you to have problems because of poor blood flow (circulation) to your feet and legs, which can cause your skin to:  Become thinner and drier.  Break more easily.  Heal more slowly.  Peel and crack. You may also have nerve damage (neuropathy) in your legs and feet, causing decreased feeling in them. This means that you may not notice minor injuries to your feet that could lead to more serious problems. Noticing and addressing any potential problems early is the best way to prevent future foot problems. How to care for your feet Foot hygiene  Wash your feet daily with warm water and mild soap. Do not use hot water. Then, pat your feet and the areas between your toes until they are completely dry. Do not soak your feet as this can dry your skin.  Trim your toenails straight across. Do not dig under them or around the cuticle. File the edges of your nails with an emery board or nail file.  Apply a moisturizing lotion or petroleum jelly to the skin on your feet and to dry, brittle toenails. Use lotion that does not contain alcohol and is unscented. Do not apply lotion between your toes. Shoes and socks  Wear clean socks or stockings every day. Make sure they are not too tight. Do not wear knee-high stockings since they may decrease blood flow to your legs.  Wear shoes that fit properly and have enough cushioning. Always look in your shoes before you put them on to be sure there are no objects inside.  To break in new shoes, wear them for just a few hours a day. This prevents injuries on your feet. Wounds, scrapes, corns, and calluses  Check your feet daily for blisters, cuts, bruises, sores, and redness. If you cannot see the bottom of your feet, use a mirror or ask someone for help.  Do not cut corns or calluses or try to remove them with medicine.  If you  find a minor scrape, cut, or break in the skin on your feet, keep it and the skin around it clean and dry. You may clean these areas with mild soap and water. Do not clean the area with peroxide, alcohol, or iodine.  If you have a wound, scrape, corn, or callus on your foot, look at it several times a day to make sure it is healing and not infected. Check for: ? Redness, swelling, or pain. ? Fluid or blood. ? Warmth. ? Pus or a bad smell. General instructions  Do not cross your legs. This may decrease blood flow to your feet.  Do not use heating pads or hot water bottles on your feet. They may burn your skin. If you have lost feeling in your feet or legs, you may not know this is happening until it is too late.  Protect your feet from hot and cold by wearing shoes, such as at the beach or on hot pavement.  Schedule a complete foot exam at least once a year (annually) or more often if you have foot problems. If you have foot problems, report any cuts, sores, or bruises to your health care provider immediately. Contact a health care provider if:  You have a medical condition that increases your risk of infection and you have any cuts, sores, or bruises on your feet.  You have an injury that is not   healing.  You have redness on your legs or feet.  You feel burning or tingling in your legs or feet.  You have pain or cramps in your legs and feet.  Your legs or feet are numb.  Your feet always feel cold.  You have pain around a toenail. Get help right away if:  You have a wound, scrape, corn, or callus on your foot and: ? You have pain, swelling, or redness that gets worse. ? You have fluid or blood coming from the wound, scrape, corn, or callus. ? Your wound, scrape, corn, or callus feels warm to the touch. ? You have pus or a bad smell coming from the wound, scrape, corn, or callus. ? You have a fever. ? You have a red line going up your leg. Summary  Check your feet every day  for cuts, sores, red spots, swelling, and blisters.  Moisturize feet and legs daily.  Wear shoes that fit properly and have enough cushioning.  If you have foot problems, report any cuts, sores, or bruises to your health care provider immediately.  Schedule a complete foot exam at least once a year (annually) or more often if you have foot problems. This information is not intended to replace advice given to you by your health care provider. Make sure you discuss any questions you have with your health care provider. Document Revised: 05/07/2019 Document Reviewed: 09/15/2016 Elsevier Patient Education  2020 Elsevier Inc.  

## 2020-02-22 ENCOUNTER — Encounter: Payer: Self-pay | Admitting: Podiatry

## 2020-02-22 NOTE — Progress Notes (Signed)
Subjective: [name] presents today at risk foot care with history of diabetic neuropathy and painful callus(es) b/l and painful mycotic toenails b/l that are difficult to trim. Pain interferes with ambulation. Aggravating factors include wearing enclosed shoe gear. Pain is relieved with periodic professional debridement.  [name], MD is patient's PCP. Last visit was: 01/20/2020.  Past Medical History:  Diagnosis Date  . Anxiety   . Arthritis    a little  . Barrett's esophagus 12/08/2015   By EGD 2010 [name])   . BPH with obstruction/lower urinary tract symptoms 2014   s/p TURP  . Depression   . DKA (diabetic ketoacidoses) ([place]) 01/2010   "in coma"  . GERD (gastroesophageal reflux disease)   . H/O bronchitis    after every cold  . HTN (hypertension)   . Hyperlipidemia    controlled with medicine  . Otitis media   . Rheumatic fever    maybe as child  . Type 2 diabetes, uncontrolled, with mild 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 macular edema ([name])    completed DMSE     Patient Active Problem List   Diagnosis Date Noted  . Ex-smoker 01/21/2020  . RLQ abdominal pain 01/21/2020  . Numbness and tingling in left arm 01/21/2020  . Health maintenance examination 01/05/2017  . Right knee pain 01/18/2016  . Barrett's esophagus 12/08/2015  . Abnormal tympanic membrane of right ear 07/05/2015  . Advanced care planning/counseling discussion 06/30/2014  . Medicare annual wellness visit, subsequent 06/22/2013  . Benign prostatic hyperplasia with urinary obstruction 06/18/2013  . ED (erectile dysfunction) 04/10/2012  . Hyperlipidemia associated with type 2 diabetes mellitus ([place]) 12/29/2008  . LEG PAIN, BILATERAL 11/19/2008  . 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 ([place]) 07/24/2007  . ANXIETY 07/24/2007  . Essential hypertension 07/24/2007  . GERD 07/24/2007    Current Outpatient Medications on File Prior to Visit   Medication Sig Dispense Refill  . amLODipine (NORVASC) 5 MG tablet Take 1 tablet (5 mg total) by mouth daily. 90 tablet 3  . Dextrose, Diabetic Use, (GLUCOSE PO) Take 1 tablet by mouth as needed.    [name] Kitchen glucagon (GLUCAGON EMERGENCY) 1 MG injection Inject 1 mg into the muscle once as needed for up to 1 dose. 1 each 12  . insulin aspart (NOVOLOG FLEXPEN) 100 UNIT/ML FlexPen Inject 10-18 Units into the skin 3 (three) times daily with meals. 48.6 mL 1  . Insulin Pen Needle (ADVOCATE INSULIN PEN NEEDLES) 31G X 5 MM MISC Use 3x a day 300 each 3  . metFORMIN (GLUCOPHAGE-XR) 500 MG 24 hr tablet Take 2 tablets (1,000 mg total) by mouth daily. TAKE 2 TABLETS BY MOUTH TWICE DAILY WITH MEALS 360 tablet 3  . olmesartan-hydrochlorothiazide (BENICAR HCT) 40-25 MG tablet Take 1 tablet by mouth daily. 90 tablet 3  . ONETOUCH ULTRA test strip USE AS INSTRUCTED 4X A DAY. DAY SUPPLY PER INSURANCE. 300 strip 1  . pantoprazole (PROTONIX) 40 MG tablet Take 1 tablet (40 mg total) by mouth daily. 90 tablet 3  . potassium chloride (KLOR-CON M10) 10 MEQ tablet Take 1 tablet (10 mEq total) by mouth 2 (two) times daily. 60 tablet 3  . sildenafil (VIAGRA) 100 MG tablet Take 0.5-1 tablets (50-100 mg total) by mouth daily as needed for erectile dysfunction. 10 tablet 3  . simvastatin (ZOCOR) 20 MG tablet TAKE 1 TABLET BY MOUTH EVERYDAY AT BEDTIME 90 tablet 3  . TRESIBA FLEXTOUCH 200 UNIT/ML FlexTouch Pen INJECT 32 UNITS INTO THE SKIN  DAILY. 9 pen 2   No current facility-administered medications on file prior to visit.     No Known Allergies  Objective: BARTLETT ENKE is a pleasant 74 y.o. Caucasian male in NAD. AAO x 3.  There were no vitals filed for this visit.  Vascular Examination: Neurovascular status unchanged b/l lower extremities. Capillary refill time to digits immediate b/l. Palpable pedal pulses b/l LE. Pedal hair absent. Lower extremity skin temperature gradient within normal limits.  Dermatological  Examination: Pedal skin with normal turgor, texture and tone bilaterally. No open wounds bilaterally. No interdigital macerations bilaterally. Toenails 1-5 b/l elongated, discolored, dystrophic, thickened, crumbly with subungual debris and tenderness to dorsal palpation. Hyperkeratotic lesion(s) submet head 1 left foot and submet head 1 right foot.  No erythema, no edema, no drainage, no flocculence.  Musculoskeletal: Normal muscle strength 5/5 to all lower extremity muscle groups bilaterally. No pain crepitus or joint limitation noted with ROM b/l. Hallux valgus with bunion deformity noted b/l lower extremities. Hammertoes noted to the 2-5 bilaterally.  Neurological Examination: Protective sensation diminished with 10g monofilament b/l. Vibratory sensation intact b/l. Proprioception intact bilaterally.  Last A1c: Hemoglobin A1C Latest Ref Rng & Units 01/12/2020 05/22/2019  HGBA1C 4.6 - 6.5 % 7.8(H) 6.9(A)  Some recent data might be hidden    Assessment: 1. Pain due to onychomycosis of toenails of both feet   2. Callus   3. Diabetic peripheral neuropathy associated with type 2 diabetes mellitus ([place])    Plan: -Examined patient. -No new findings. No new orders. -Continue diabetic foot care principles. -Toenails 1-5 b/l were debrided in length and girth with sterile nail nippers and dremel without iatrogenic bleeding.  -Callus(es) submet head 1 left foot and submet head 1 right foot pared utilizing sterile scalpel blade without complication or incident. Total number debrided =2. -Patient to report any pedal injuries to medical professional immediately. -Patient to continue soft, supportive shoe gear daily. -Patient/POA to call should there be question/concern in the interim.  Return in about 3 months (around 05/20/2020) for diabetic nail and callus trim.  Marzetta Board, DPM

## 2020-02-23 ENCOUNTER — Other Ambulatory Visit: Payer: Self-pay | Admitting: Surgery

## 2020-02-23 DIAGNOSIS — K4020 Bilateral inguinal hernia, without obstruction or gangrene, not specified as recurrent: Secondary | ICD-10-CM | POA: Diagnosis not present

## 2020-02-25 ENCOUNTER — Encounter: Payer: Self-pay | Admitting: Acute Care

## 2020-02-25 ENCOUNTER — Other Ambulatory Visit: Payer: Self-pay

## 2020-02-25 ENCOUNTER — Ambulatory Visit (INDEPENDENT_AMBULATORY_CARE_PROVIDER_SITE_OTHER): Payer: Medicare HMO | Admitting: Acute Care

## 2020-02-25 ENCOUNTER — Ambulatory Visit
Admission: RE | Admit: 2020-02-25 | Discharge: 2020-02-25 | Disposition: A | Discharge status: Home / Self Care | Payer: Medicare HMO | Source: Ambulatory Visit | Attending: Acute Care | Admitting: Acute Care

## 2020-02-25 VITALS — BP 122/78 | HR 71 | Temp 97.3°F | Ht 72.0 in | Wt 213.6 lb

## 2020-02-25 DIAGNOSIS — F17210 Nicotine dependence, cigarettes, uncomplicated: Secondary | ICD-10-CM | POA: Diagnosis not present

## 2020-02-25 DIAGNOSIS — R69 Illness, unspecified: Secondary | ICD-10-CM | POA: Diagnosis not present

## 2020-02-25 DIAGNOSIS — Z122 Encounter for screening for malignant neoplasm of respiratory organs: Secondary | ICD-10-CM

## 2020-02-25 DIAGNOSIS — Z87891 Personal history of nicotine dependence: Secondary | ICD-10-CM | POA: Diagnosis not present

## 2020-02-25 NOTE — Patient Instructions (Signed)
Thank you for participating in the [facility]. It was our pleasure to meet you today. We will call you with the results of your scan within the next few days. Your scan will be assigned a Lung RADS category score by the physicians reading the scans.  This Lung RADS score determines follow up scanning.  See below for description of categories, and follow up screening recommendations. We will be in touch to schedule your follow up screening annually or based on recommendations of our providers. We will fax a copy of your scan results to your Primary Care Physician, or the physician who referred you to the program, to ensure they have the results. Please call the office if you have any questions or concerns regarding your scanning experience or results.  Our office number is [phone]. Please speak with Denise Phelps, RN. She is our Lung Cancer Screening RN. If she is unavailable when you call, please have the office staff send her a message. She will return your call at her earliest convenience. Remember, if your scan is normal, we will scan you annually as long as you continue to meet the criteria for the program. (Age 72-77, Current smoker or smoker who has quit within the last 15 years). If you are a smoker, remember, quitting is the single most powerful action that you can take to decrease your risk of lung cancer and other pulmonary, breathing related problems. We know quitting is hard, and we are here to help.  Please let us know if there is anything we can do to help you meet your goal of quitting. If you are a former smoker, congratulations. We are proud of you! Remain smoke free! Remember you can refer friends or family members through the number above.  We will screen them to make sure they meet criteria for the program. Thank you for helping us take better care of you by participating in Lung Screening.  Lung RADS Categories:  Lung RADS 1: no nodules  or definitely non-concerning nodules.  Recommendation is for a repeat annual scan in 12 months.  Lung RADS 2:  nodules that are non-concerning in appearance and behavior with a very low likelihood of becoming an active cancer. Recommendation is for a repeat annual scan in 12 months.  Lung RADS 3: nodules that are probably non-concerning , includes nodules with a low likelihood of becoming an active cancer.  Recommendation is for a 6-month repeat screening scan. Often noted after an upper respiratory illness. We will be in touch to make sure you have no questions, and to schedule your 6-month scan.  Lung RADS 4 A: nodules with concerning findings, recommendation is most often for a follow up scan in 3 months or additional testing based on our provider's assessment of the scan. We will be in touch to make sure you have no questions and to schedule the recommended 3 month follow up scan.  Lung RADS 4 B:  indicates findings that are concerning. We will be in touch with you to schedule additional diagnostic testing based on our provider's  assessment of the scan.   

## 2020-02-25 NOTE — Progress Notes (Signed)
Shared Decision Making Visit Lung Cancer Screening Program [phone])   Eligibility:  Age 74 y.o.  Pack Years Smoking History Calculation 32 pack year smoking history (# packs/per year x # years smoked)  Recent History of coughing up blood  no  Unexplained weight loss? no ( >Than 15 pounds within the last 6 months )  Prior History Lung / other cancer no (Diagnosis within the last 5 years already requiring surveillance chest CT Scans).  Smoking Status : Former smoker  Former Smokers: Years since quit:7 years  Quit Date: 2014  Visit Components:  Discussion included one or more decision making aids. yes  Discussion included risk/benefits of screening. yes  Discussion included potential follow up diagnostic testing for abnormal scans. yes  Discussion included meaning and risk of over diagnosis. yes  Discussion included meaning and risk of False Positives. yes  Discussion included meaning of total radiation exposure. yes  Counseling Included:  Importance of adherence to annual lung cancer LDCT screening. yes  Impact of comorbidities on ability to participate in the program. yes  Ability and willingness to under diagnostic treatment. yes  Smoking Cessation Counseling:  Current Smokers:   Discussed importance of smoking cessation. NA Former smoker  Information about tobacco cessation classes and interventions provided to patient. yes  Patient provided with "ticket" for LDCT Scan. yes  Symptomatic Patient. no  Counseling  Diagnosis Code: Tobacco Use Z72.0  Asymptomatic Patient yes  Counseling (Intermediate counseling: > three minutes counseling) V7616  Former Smokers:   Discussed the importance of maintaining cigarette abstinence. yes  Diagnosis Code: Personal History of Nicotine Dependence. W73.710  Information about tobacco cessation classes and interventions provided to patient. Yes  Patient provided with "ticket" for LDCT Scan. yes  Written Order for  Lung Cancer Screening with LDCT placed in Epic. Yes (CT Chest Lung Cancer Screening Low Dose W/O CM) [id number] Z12.2-Screening of respiratory organs Z87.891-Personal history of nicotine dependence  I spent 25 minutes of face to face time with Anthony [name] discussing the risks and benefits of lung cancer screening. We viewed a power point together that explained in detail the above noted topics. We took the time to pause the power point at intervals to allow for questions to be asked and answered to ensure understanding. We discussed that he had taken the single most powerful action possible to decrease his risk of developing lung cancer when he quit smoking. I counseled him to remain smoke free, and to contact me if he ever had the desire to smoke again so that I can provide resources and tools to help support the effort to remain smoke free. We discussed the time and location of the scan, and that either  Doroteo Glassman RN or I will call with the results within  24-48 hours of receiving them. He has my card and contact information in the event he needs to speak with me, in addition to a copy of the power point we reviewed as a resource. He verbalized understanding of all of the above and had no further questions upon leaving the office.     I explained to the patient that there has been a high incidence of coronary artery disease noted on these exams. I explained that this is a non-gated exam therefore degree or severity cannot be determined. This patient is currently  on statin therapy. I have asked the patient to follow-up with their PCP regarding any incidental finding of coronary artery disease and management with diet or medication as they  feel is clinically indicated. The patient verbalized understanding of the above and had no further questions.     Anthony Spatz, NP 02/25/2020 12:04 PM

## 2020-02-26 ENCOUNTER — Other Ambulatory Visit: Payer: Self-pay | Admitting: *Deleted

## 2020-02-26 DIAGNOSIS — Z87891 Personal history of nicotine dependence: Secondary | ICD-10-CM

## 2020-02-26 NOTE — Progress Notes (Signed)

## 2020-02-27 NOTE — Pre-Procedure Instructions (Signed)
Anthony [name]  02/27/2020    Your procedure is scheduled on Thursday, July 8.  Report to [facility], Main Entrance or Entrance "A" at 7:00 AM                Your surgery or procedure is scheduled for 9:00 A.M.- 10:00 A.M.   Call this number if you have problems the morning of surgery: [phone]  This is the number for the Pre- Surgical Desk                  For any other questions, please call [phone], Monday - Friday 8 AM - 4 PM.   Remember:  Do not eat  after midnight.  You may drink clear liquids until 6:00 AM .  Clear liquids allowed are:    Water, Juice (non-citric and without pulp - diabetics please choose diet or no sugar options), Carbonated beverages - (diabetics please choose diet or no sugar options), Clear Tea, Black Coffee only (no creamer, milk or cream including half and half), Plain Jell-O only (diabetics please choose diet or no sugar options), Gatorade (diabetics please choose diet or no sugar options) and Plain Popsicles only    Take these medicines the morning of surgery with A SIP OF WATER :  amLODipine (NORVASC             pantoprazole (PROTONIX)  Take if needed: traMADol Anthony [name])    STOP taking Aspirin, Aspirin Products (Goody Powder, Excedrin Migraine), Ibuprofen (Advil), Naproxen (Aleve), Vitamins and Herbal Products (ie Fish Oil).  DO NOT take Metformin or olmesartan-hydrochlorothiazide (BENICAR HCT) the morning of surgery.   How to Manage Your Diabetes Before and After Surgery  WHAT DO I DO ABOUT MY DIABETES MEDICATION? [name] Kitchen Do not take oral diabetes medicines (pills) the morning of surgery.   . THE NIGHT BEFORE SURGERY, take 18 units of TRESIBA insulin. .  .  The Morning of Surgery: If your CBG is greater than 220 mg/dL, you may take  of your sliding scale (correction)  dose of insulin.  How to Manage Your Diabetes Before and After Surgery  Why is it important to control my blood sugar before and after surgery? . Improving  blood sugar levels before and after surgery helps healing and can limit problems. . A way of improving blood sugar control is eating a healthy diet by: o  Eating less sugar and carbohydrates o  Increasing activity/exercise o  Talking with your doctor about reaching your blood sugar goals . High blood sugars (greater than 180 mg/dL) can raise your risk of infections and slow your recovery, so you will need to focus on controlling your diabetes during the weeks before surgery. . Make sure that the doctor who takes care of your diabetes knows about your planned surgery including the date and location.  How do I manage my blood sugar before surgery? . Check your blood sugar at least 4 times a day, starting 2 days before surgery, to make sure that the level is not too high or low. o Check your blood sugar the morning of your surgery when you wake up and every 2 hours until you get to the Short Stay unit. . If your blood sugar is less than 70 mg/dL, you will need to treat for low blood sugar: o Do not take insulin. o Treat a low blood sugar (less than 70 mg/dL) with  cup of clear juice (cranberry or apple), 4 glucose tablets, OR glucose  gel. Recheck blood sugar in 15 minutes after treatment (to make sure it is greater than 70 mg/dL). If your blood sugar is not greater than 70 mg/dL on recheck, call [phone] o  for further instructions. . Report your blood sugar to the short stay nurse when you get to Short Stay.  . If you are admitted to the hospital after surgery: o Your blood sugar will be checked by the staff and you will probably be given insulin after surgery (instead of oral diabetes medicines) to make sure you have good blood sugar levels. o The goal for blood sugar control after surgery is 80-180 mg/dL.   Special instructions:   [facility]- Preparing For Surgery  Before surgery, you can play an important role. Because skin is not sterile, your skin needs to be as free of germs as  possible. You can reduce the number of germs on your skin by washing with CHG (chlorahexidine gluconate) Soap before surgery.  CHG is an antiseptic cleaner which kills germs and bonds with the skin to continue killing germs even after washing.    Oral Hygiene is also important to reduce your risk of infection.  Remember - BRUSH YOUR TEETH THE MORNING OF SURGERY WITH YOUR REGULAR TOOTHPASTE  Please do not use if you have an allergy to CHG or antibacterial soaps. If your skin becomes reddened/irritated stop using the CHG.  Do not shave (including legs and underarms) for at least 48 hours prior to first CHG shower. It is OK to shave your face.  Please follow these instructions carefully.   1. Shower the NIGHT BEFORE SURGERY and the MORNING OF SURGERY with CHG.   2. If you chose to wash your hair, wash your hair first as usual with your normal shampoo.  3. After you shampoo, wash your face and private area with the soap you use at home, then rinse your hair and body thoroughly to remove the shampoo and soap.  4. Use CHG as you would any other liquid soap. You can apply CHG directly to the skin and wash gently with a scrungie or a clean washcloth.   5. Apply the CHG Soap to your body ONLY FROM THE NECK DOWN.  Do not use on open wounds or open sores. Avoid contact with your eyes, ears, mouth and genitals (private parts).  6. Wash thoroughly, paying special attention to the area where your surgery will be performed.  7. Thoroughly rinse your body with warm water from the neck down.  8. DO NOT shower/wash with your normal soap after using and rinsing off the CHG Soap.  9. Pat yourself dry with a CLEAN TOWEL.  10. Wear CLEAN PAJAMAS to bed the night before surgery, wear comfortable clothes the morning of surgery  11. Place CLEAN SHEETS on your bed the night of your first shower and DO NOT SLEEP WITH PETS.  Day of Surgery: Shower as written above. Do not wear lotions, powders, or colognes, or  deodorant. Please wear clean clothes to the hospital/surgery center.   Remember to brush your teeth WITH YOUR REGULAR TOOTHPASTE.  Do not wear jewelry, make-up or nail polish.             Men may shave face and neck.  Do not bring valuables to the hospital.  [facility] is not responsible for any belongings or valuables.  Contacts, dentures or bridgework may not be worn into surgery.  Leave your suitcase in the car.  After surgery it may be  brought to your room.  For patients admitted to the hospital, discharge time will be determined by your treatment team.  Patients discharged the day of surgery will not be allowed to drive home.   Please read over the following fact sheets that you were given: Coughing and Deep Breathing, Pain Booklet and Surgical Site Infections

## 2020-02-28 ENCOUNTER — Encounter: Payer: Self-pay | Admitting: Family Medicine

## 2020-02-28 DIAGNOSIS — I2510 Atherosclerotic heart disease of native coronary artery without angina pectoris: Secondary | ICD-10-CM

## 2020-02-28 DIAGNOSIS — J449 Chronic obstructive pulmonary disease, unspecified: Secondary | ICD-10-CM | POA: Insufficient documentation

## 2020-02-28 DIAGNOSIS — I700 Atherosclerosis of aorta: Secondary | ICD-10-CM | POA: Insufficient documentation

## 2020-02-28 HISTORY — DX: Atherosclerosis of aorta: I70.0

## 2020-02-28 HISTORY — DX: Chronic obstructive pulmonary disease, unspecified: J44.9

## 2020-02-28 HISTORY — DX: Atherosclerotic heart disease of native coronary artery without angina pectoris: I25.10

## 2020-03-02 ENCOUNTER — Other Ambulatory Visit (HOSPITAL_COMMUNITY)
Admission: RE | Admit: 2020-03-02 | Discharge: 2020-03-02 | Disposition: A | Discharge status: Home / Self Care | Payer: Medicare HMO | Source: Ambulatory Visit | Attending: Surgery | Admitting: Surgery

## 2020-03-02 ENCOUNTER — Other Ambulatory Visit: Payer: Self-pay

## 2020-03-02 ENCOUNTER — Encounter (HOSPITAL_COMMUNITY): Payer: Self-pay

## 2020-03-02 ENCOUNTER — Encounter (HOSPITAL_COMMUNITY)
Admission: RE | Admit: 2020-03-02 | Discharge: 2020-03-02 | Disposition: A | Discharge status: Home / Self Care | Payer: Medicare HMO | Source: Ambulatory Visit | Attending: Surgery | Admitting: Surgery

## 2020-03-02 DIAGNOSIS — Z01818 Encounter for other preprocedural examination: Secondary | ICD-10-CM | POA: Diagnosis not present

## 2020-03-02 DIAGNOSIS — I10 Essential (primary) hypertension: Secondary | ICD-10-CM | POA: Diagnosis not present

## 2020-03-02 DIAGNOSIS — K469 Unspecified abdominal hernia without obstruction or gangrene: Secondary | ICD-10-CM | POA: Diagnosis not present

## 2020-03-02 DIAGNOSIS — E118 Type 2 diabetes mellitus with unspecified complications: Secondary | ICD-10-CM | POA: Insufficient documentation

## 2020-03-02 DIAGNOSIS — Z20822 Contact with and (suspected) exposure to covid-19: Secondary | ICD-10-CM | POA: Diagnosis not present

## 2020-03-02 LAB — BASIC METABOLIC PANEL
Anion gap: 9 (ref 5–15)
BUN: 12 mg/dL (ref 8–23)
CO2: 29 mmol/L (ref 22–32)
Calcium: 9 mg/dL (ref 8.9–10.3)
Chloride: 101 mmol/L (ref 98–111)
Creatinine, Ser: 1.02 mg/dL (ref 0.61–1.24)
GFR calc Af Amer: >60 mL/min (ref >60)
GFR calc non Af Amer: >60 mL/min (ref >60)
Glucose, Bld: 185 mg/dL — ABNORMAL HIGH (ref 70–99)
Potassium: 3.6 mmol/L (ref 3.5–5.1)
Sodium: 139 mmol/L (ref 135–145)

## 2020-03-02 LAB — CBC
HCT: 42.8 % (ref 39.0–52.0)
Hemoglobin: 14.5 g/dL (ref 13.0–17.0)
MCH: 30.7 pg (ref 26.0–34.0)
MCHC: 33.9 g/dL (ref 30.0–36.0)
MCV: 90.7 fL (ref 80.0–100.0)
Platelets: 227 10*3/uL (ref 150–400)
RBC: 4.72 MIL/uL (ref 4.22–5.81)
RDW: 12.5 % (ref 11.5–15.5)
WBC: 8.5 10*3/uL (ref 4.0–10.5)
nRBC: 0 % (ref 0.0–0.2)

## 2020-03-02 LAB — HEMOGLOBIN A1C
Hgb A1c MFr Bld: 7.7 % — ABNORMAL HIGH (ref 4.8–5.6)
Mean Plasma Glucose: 174.29 mg/dL

## 2020-03-02 LAB — SARS CORONAVIRUS 2 (TAT 6-24 HRS): SARS Coronavirus 2: NEGATIVE

## 2020-03-02 LAB — GLUCOSE, CAPILLARY: Glucose-Capillary: 132 mg/dL — ABNORMAL HIGH (ref 70–99)

## 2020-03-02 MED ORDER — ORAL CARE MOUTH RINSE: 15.0000 mL | Freq: Once | OROMUCOSAL | Status: AC

## 2020-03-02 MED ORDER — LACTATED RINGERS IV SOLN: INTRAVENOUS | Status: DC

## 2020-03-02 MED ORDER — CHLORHEXIDINE GLUCONATE 0.12 % MT SOLN
15.0000 mL | Freq: Once | OROMUCOSAL | Status: AC
  Administered 2020-03-04: 15 mL via OROMUCOSAL
  Filled 2020-03-02: qty 15

## 2020-03-02 NOTE — Progress Notes (Addendum)
PCP - Dr. Alford Highland   Endocrinologist- [name]  Cardiologist -   Chest x-ray - na  EKG - 03/02/2020 - no change from  2014- except Sinus Arrythmia  Stress Test - no  ECHO - no  Cardiac Cath - no  Sleep Study - no CPAP - no  LABS-CBC, BMP  ASA-no  ERAS- yes- clear, no extra beverage.  HA1C- 7.7- 03/02/2020 Fasting Blood Sugar - 90- 120 Checks Blood Sugar __4___ times a day  Anesthesia-  Pt denies having chest pain, sob, or fever at this time. All instructions explained to the pt, with a verbal understanding of the material. Pt agrees to go over the instructions while at home for a better understanding. Pt also instructed to self quarantine after being tested for COVID-19. The opportunity to ask questions was provided.  Mr. Briscoe Deutscher reports that pain has been worse the  past 2 days, and he feels bubbling and this was also new.  I called [facility] and spoke with Gwyn, she asked if hernia can be reduced, he had not been told how to reduce the hernia. Gwyn asked if hernia went it when lies down, patient said yes, Rebekah Chesterfield said that is reduced.  Gwyn said to tell patient if it gets to the point that it did not go in or if the pain gets worse to come to the Emergency Department.  I received a message from the front desk that Mr Fickle family have asked if patient's CMA may come and wait in the  Waiting Room while patient has surgery.  I called and spoke to Mr. Im, he told me that patient left a waiting room without telling anyone and he found her outside. I emailed Anette Riedel and explained the situation, she said that yes that CMA can stay with Mrs. Costanza in the waiting area.

## 2020-03-02 NOTE — Progress Notes (Addendum)
PCP - Dr. Alford Highland   Endocrinologist- [name]  Cardiologist -   Chest x-ray - na  EKG - 03/02/2020 - no change from  2014- except Sinus Arrythmia  Stress Test - no  ECHO - no  Cardiac Cath - no  Sleep Study - no CPAP - no  LABS-CBC, BMP  ASA-  ERAS-no  HA1C- 7.7- 03/02/2020 Fasting Blood Sugar - 90- 120 Checks Blood Sugar __4___ times a day  Anesthesia-  Pt denies having chest pain, sob, or fever at this time. All instructions explained to the pt, with a verbal understanding of the material. Pt agrees to go over the instructions while at home for a better understanding. Pt also instructed to self quarantine after being tested for COVID-19. The opportunity to ask questions was provided.  [name] reports that pain has been worse the  past 2 days, and he feels bubbling and this was also new.  I called [facility] and spoke with Gwyn, she asked if hernia can be reduced, he had not been told how to reduce the hernia. Gwyn asked if hernia went it when lies down, patient said yes, Rebekah Chesterfield said that is reduced.  Gwyn said to tell patient if it gets to the point that it did not go in or if the pain gets worse to come to the Emergency Department.

## 2020-03-03 NOTE — H&P (Signed)
[name] Appointment: 02/23/2020 3:20 PM Location: [facility] Patient #: [id number] DOB: 1946/01/26 Married / Language: Cleophus Molt / Race: White Male   History of Present Illness (Jarrod Mcenery A. Ninfa Linden MD; 02/23/2020 3:38 PM) The patient is a 74 year old male who presents for an evaluation of a hernia.  Chief complaint: Right groin pain  This is a 74 year old gentleman referred for evaluation of right groin pain and inguinal hernias. For several weeks he has been having sharp intermittent right inguinal pain. This first attempt is going. He has had no nausea, vomiting, or obstructive symptoms. He describes the pain as sharp and moderate to severe. He has no pain on the left. He has noticed a slight bulge on the right. He underwent a CT scan of the abdomen and pelvis showing bilateral inguinal hernias   Past Surgical History Malachi Bonds, CMA; 02/23/2020 3:21 PM) Appendectomy   Diagnostic Studies History Malachi Bonds, CMA; 02/23/2020 3:21 PM) Colonoscopy  1-5 years ago  Allergies Malachi Bonds, CMA; 02/23/2020 3:22 PM) No Known Drug Allergies  [02/23/2020]:  Medication History Malachi Bonds, CMA; 02/23/2020 3:23 PM) amLODIPine Besylate (5MG  Tablet, Oral) Active. [name] FlexTouch (200UNIT/ML Soln Pen-inj, Subcutaneous) Active. NovoLOG FlexPen (100UNIT/ML Soln Pen-inj, Subcutaneous) Active. Olmesartan Medoxomil-HCTZ (40-25MG  Tablet, Oral) Active. metFORMIN HCl ER (500MG  Tablet ER 24HR, Oral) Active. Pantoprazole Sodium (40MG  Tablet DR, Oral) Active. Simvastatin (20MG  Tablet, Oral) Active. Klor-Con 10 (10MEQ Tablet ER, Oral) Active. Viagra (100MG  Tablet, Oral) Active. Medications Reconciled  Other Problems Malachi Bonds, CMA; 02/23/2020 3:21 PM) Diabetes Mellitus  Gastroesophageal Reflux Disease  Umbilical Hernia Repair  Ventral Hernia Repair     Review of Systems (Chemira Jones CMA; 02/23/2020 3:21 PM) General Not Present- Appetite  Loss, Chills, Fatigue, Fever, Night Sweats, Weight Gain and Weight Loss. Skin Not Present- Change in Wart/Mole, Dryness, Hives, Jaundice, New Lesions, Non-Healing Wounds, Rash and Ulcer. HEENT Present- Wears glasses/contact lenses. Not Present- Earache, Hearing Loss, Hoarseness, Nose Bleed, Oral Ulcers, Ringing in the Ears, Seasonal Allergies, Sinus Pain, Sore Throat, Visual Disturbances and Yellow Eyes. Respiratory Present- Snoring. Not Present- Bloody sputum, Chronic Cough, Difficulty Breathing and Wheezing. Breast Not Present- Breast Mass, Breast Pain, Nipple Discharge and Skin Changes. Cardiovascular Present- Leg Cramps and Shortness of Breath. Not Present- Chest Pain, Difficulty Breathing Lying Down, Palpitations, Rapid Heart Rate and Swelling of Extremities. Gastrointestinal Present- Abdominal Pain. Not Present- Bloating, Bloody Stool, Change in Bowel Habits, Chronic diarrhea, Constipation, Difficulty Swallowing, Excessive gas, Gets full quickly at meals, Hemorrhoids, Indigestion, Nausea, Rectal Pain and Vomiting. Male Genitourinary Present- Nocturia. Not Present- Blood in Urine, Change in Urinary Stream, Frequency, Impotence, Painful Urination, Urgency and Urine Leakage.  Vitals (Chemira Jones CMA; 02/23/2020 3:22 PM) 02/23/2020 3:21 PM Weight: 212.6 lb Height: 71in Body Surface Area: 2.16 m Body Mass Index: 29.65 kg/m  Temp.: 97.90F  Pulse: 100 (Regular)  BP: 112/70(Sitting, Left Arm, Standard)       Physical Exam (Bettylee Feig A. Ninfa Linden MD; 02/23/2020 3:39 PM) The physical exam findings are as follows: Note: On exam, he appears well.  His abdomen is soft. There is a very tender but easily reducible right inguinal hernia. There is a smaller, nontender, easily reducible left inguinal hernia. I cannot feel an umbilical hernia.  I reviewed the CAT scan of his abdomen and pelvis showing bilateral inguinal hernias. The patient's sigmoid colon is minimally involved in the right  inguinal hernia    Assessment & Plan (Austine Wiedeman A. Ninfa Linden MD; 02/23/2020 3:41 PM) BILATERAL INGUINAL HERNIA (K40.20) Impression: This  is a patient with bilateral inguinal hernias. I reviewed his notes in the electronic medical records and a CT scan of the abdomen and pelvis. I discussed the diagnosis with the patient and his wife. We discussed the reasoning for hernia repair. As he is quite symptomatic I would recommend repair with mesh. He does not have signs of incarceration on examination. I discussed both the open and laparoscopic techniques with him. As this is bilateral, I would recommend bilateral laparoscopic inguinal hernia repair with mesh. I discussed the risks of surgery. These include but are not limited to bleeding, infection, injury to surrounding structures, need to convert to an open procedure, nerve entrapment, chronic pain, hernia recurrence, use of mesh, cardiopulmonary issues, etc. They understand and wished to the surgery which will be scheduled urgently given his discomfort and tenderness.

## 2020-03-04 ENCOUNTER — Encounter (HOSPITAL_COMMUNITY): Payer: Self-pay | Admitting: Surgery

## 2020-03-04 ENCOUNTER — Ambulatory Visit (HOSPITAL_COMMUNITY): Payer: Medicare HMO | Admitting: Anesthesiology

## 2020-03-04 ENCOUNTER — Other Ambulatory Visit: Payer: Self-pay

## 2020-03-04 ENCOUNTER — Ambulatory Visit (HOSPITAL_COMMUNITY)
Admission: RE | Admit: 2020-03-04 | Discharge: 2020-03-04 | Disposition: A | Discharge status: Home / Self Care | Payer: Medicare HMO | Attending: Surgery | Admitting: Surgery

## 2020-03-04 ENCOUNTER — Encounter (HOSPITAL_COMMUNITY)
Admission: RE | Disposition: A | Discharge status: Home / Self Care | Payer: Self-pay | Source: Home / Self Care | Attending: Surgery

## 2020-03-04 DIAGNOSIS — I10 Essential (primary) hypertension: Secondary | ICD-10-CM | POA: Insufficient documentation

## 2020-03-04 DIAGNOSIS — K4020 Bilateral inguinal hernia, without obstruction or gangrene, not specified as recurrent: Secondary | ICD-10-CM | POA: Insufficient documentation

## 2020-03-04 DIAGNOSIS — Z7984 Long term (current) use of oral hypoglycemic drugs: Secondary | ICD-10-CM | POA: Diagnosis not present

## 2020-03-04 DIAGNOSIS — F419 Anxiety disorder, unspecified: Secondary | ICD-10-CM | POA: Insufficient documentation

## 2020-03-04 DIAGNOSIS — E785 Hyperlipidemia, unspecified: Secondary | ICD-10-CM | POA: Diagnosis not present

## 2020-03-04 DIAGNOSIS — Z79899 Other long term (current) drug therapy: Secondary | ICD-10-CM | POA: Diagnosis not present

## 2020-03-04 DIAGNOSIS — Z87891 Personal history of nicotine dependence: Secondary | ICD-10-CM | POA: Insufficient documentation

## 2020-03-04 DIAGNOSIS — F329 Major depressive disorder, single episode, unspecified: Secondary | ICD-10-CM | POA: Insufficient documentation

## 2020-03-04 DIAGNOSIS — K219 Gastro-esophageal reflux disease without esophagitis: Secondary | ICD-10-CM | POA: Insufficient documentation

## 2020-03-04 DIAGNOSIS — E119 Type 2 diabetes mellitus without complications: Secondary | ICD-10-CM | POA: Insufficient documentation

## 2020-03-04 DIAGNOSIS — J449 Chronic obstructive pulmonary disease, unspecified: Secondary | ICD-10-CM | POA: Insufficient documentation

## 2020-03-04 DIAGNOSIS — R69 Illness, unspecified: Secondary | ICD-10-CM | POA: Diagnosis not present

## 2020-03-04 DIAGNOSIS — M1990 Unspecified osteoarthritis, unspecified site: Secondary | ICD-10-CM | POA: Diagnosis not present

## 2020-03-04 HISTORY — PX: INGUINAL HERNIA REPAIR: SHX194

## 2020-03-04 LAB — GLUCOSE, CAPILLARY
Glucose-Capillary: 117 mg/dL — ABNORMAL HIGH (ref 70–99)
Glucose-Capillary: 152 mg/dL — ABNORMAL HIGH (ref 70–99)

## 2020-03-04 SURGERY — REPAIR, HERNIA, INGUINAL, BILATERAL, LAPAROSCOPIC
Anesthesia: General | Site: Inguinal | Laterality: Bilateral

## 2020-03-04 MED ORDER — GABAPENTIN 300 MG PO CAPS
300.0000 mg | ORAL_CAPSULE | ORAL | Status: AC
  Administered 2020-03-04: 300 mg via ORAL
  Filled 2020-03-04: qty 1

## 2020-03-04 MED ORDER — OXYCODONE HCL 5 MG PO TABS
ORAL_TABLET | ORAL | Status: AC
  Filled 2020-03-04: qty 1

## 2020-03-04 MED ORDER — FENTANYL CITRATE (PF) 250 MCG/5ML IJ SOLN
INTRAMUSCULAR | Status: AC
  Filled 2020-03-04: qty 5

## 2020-03-04 MED ORDER — LABETALOL HCL 5 MG/ML IV SOLN
INTRAVENOUS | Status: AC
  Filled 2020-03-04: qty 4

## 2020-03-04 MED ORDER — BUPIVACAINE HCL (PF) 0.25 % IJ SOLN
INTRAMUSCULAR | Status: DC | PRN
  Administered 2020-03-04: 20 mL

## 2020-03-04 MED ORDER — ONDANSETRON HCL 4 MG/2ML IJ SOLN
INTRAMUSCULAR | Status: DC | PRN
  Administered 2020-03-04: 4 mg via INTRAVENOUS

## 2020-03-04 MED ORDER — BUPIVACAINE HCL (PF) 0.25 % IJ SOLN
INTRAMUSCULAR | Status: AC
  Filled 2020-03-04: qty 30

## 2020-03-04 MED ORDER — STERILE WATER FOR IRRIGATION IR SOLN
Status: DC | PRN
  Administered 2020-03-04: 1000 mL

## 2020-03-04 MED ORDER — OXYCODONE HCL 5 MG PO TABS
5.0000 mg | ORAL_TABLET | Freq: Once | ORAL | Status: AC | PRN
  Administered 2020-03-04: 5 mg via ORAL

## 2020-03-04 MED ORDER — FENTANYL CITRATE (PF) 250 MCG/5ML IJ SOLN
INTRAMUSCULAR | Status: DC | PRN
  Administered 2020-03-04 (×2): 100 ug via INTRAVENOUS

## 2020-03-04 MED ORDER — LACTATED RINGERS IV SOLN: INTRAVENOUS | Status: DC

## 2020-03-04 MED ORDER — SODIUM CHLORIDE 0.9 % IR SOLN
Status: DC | PRN
  Administered 2020-03-04: 1000 mL

## 2020-03-04 MED ORDER — ONDANSETRON HCL 4 MG/2ML IJ SOLN: 4.0000 mg | Freq: Four times a day (QID) | INTRAMUSCULAR | Status: DC | PRN

## 2020-03-04 MED ORDER — CHLORHEXIDINE GLUCONATE CLOTH 2 % EX PADS: 6.0000 | MEDICATED_PAD | Freq: Once | CUTANEOUS | Status: DC

## 2020-03-04 MED ORDER — FENTANYL CITRATE (PF) 100 MCG/2ML IJ SOLN
INTRAMUSCULAR | Status: AC
  Filled 2020-03-04: qty 2

## 2020-03-04 MED ORDER — TRAMADOL HCL 50 MG PO TABS: 50.0000 mg | ORAL_TABLET | Freq: Four times a day (QID) | ORAL | 0 refills | Status: DC | PRN

## 2020-03-04 MED ORDER — ACETAMINOPHEN 500 MG PO TABS
1000.0000 mg | ORAL_TABLET | ORAL | Status: AC
  Administered 2020-03-04: 1000 mg via ORAL
  Filled 2020-03-04: qty 2

## 2020-03-04 MED ORDER — LABETALOL HCL 5 MG/ML IV SOLN
10.0000 mg | INTRAVENOUS | Status: DC | PRN
  Administered 2020-03-04: 10 mg via INTRAVENOUS

## 2020-03-04 MED ORDER — ORAL CARE MOUTH RINSE: 15.0000 mL | Freq: Once | OROMUCOSAL | Status: DC

## 2020-03-04 MED ORDER — PROPOFOL 10 MG/ML IV BOLUS
INTRAVENOUS | Status: DC | PRN
  Administered 2020-03-04: 200 mg via INTRAVENOUS

## 2020-03-04 MED ORDER — LIDOCAINE 2% (20 MG/ML) 5 ML SYRINGE
INTRAMUSCULAR | Status: DC | PRN
  Administered 2020-03-04: 30 mg via INTRAVENOUS

## 2020-03-04 MED ORDER — FENTANYL CITRATE (PF) 100 MCG/2ML IJ SOLN
25.0000 ug | INTRAMUSCULAR | Status: DC | PRN
  Administered 2020-03-04 (×3): 50 ug via INTRAVENOUS

## 2020-03-04 MED ORDER — CEFAZOLIN SODIUM-DEXTROSE 2-4 GM/100ML-% IV SOLN
2.0000 g | INTRAVENOUS | Status: AC
  Administered 2020-03-04: 2 g via INTRAVENOUS
  Filled 2020-03-04: qty 100

## 2020-03-04 MED ORDER — CHLORHEXIDINE GLUCONATE 0.12 % MT SOLN: 15.0000 mL | Freq: Once | OROMUCOSAL | Status: DC

## 2020-03-04 MED ORDER — PROPOFOL 10 MG/ML IV BOLUS
INTRAVENOUS | Status: AC
  Filled 2020-03-04: qty 20

## 2020-03-04 MED ORDER — OXYCODONE HCL 5 MG/5ML PO SOLN: 5.0000 mg | Freq: Once | ORAL | Status: AC | PRN

## 2020-03-04 MED ORDER — SUGAMMADEX SODIUM 200 MG/2ML IV SOLN
INTRAVENOUS | Status: DC | PRN
  Administered 2020-03-04: 400 mg via INTRAVENOUS

## 2020-03-04 MED ORDER — ROCURONIUM BROMIDE 10 MG/ML (PF) SYRINGE
PREFILLED_SYRINGE | INTRAVENOUS | Status: DC | PRN
  Administered 2020-03-04: 50 mg via INTRAVENOUS

## 2020-03-04 SURGICAL SUPPLY — 40 items
ADH SKN CLS APL DERMABOND .7 (GAUZE/BANDAGES/DRESSINGS) ×1
APL PRP STRL LF DISP 70% ISPRP (MISCELLANEOUS) ×1
APPLIER CLIP LOGIC TI 5 (MISCELLANEOUS) IMPLANT
APR CLP MED LRG 33X5 (MISCELLANEOUS)
BLADE CLIPPER SURG (BLADE) IMPLANT
CANISTER SUCT 3000ML PPV (MISCELLANEOUS) IMPLANT
CHLORAPREP W/TINT 26 (MISCELLANEOUS) ×2 IMPLANT
COVER SURGICAL LIGHT HANDLE (MISCELLANEOUS) ×1 IMPLANT
COVER WAND RF STERILE (DRAPES) ×1 IMPLANT
DERMABOND ADVANCED (GAUZE/BANDAGES/DRESSINGS) ×1
DERMABOND ADVANCED .7 DNX12 (GAUZE/BANDAGES/DRESSINGS) ×1 IMPLANT
DEVICE SECURE STRAP 25 ABSORB (INSTRUMENTS) ×2 IMPLANT
DISSECT BALLN SPACEMKR + OVL (BALLOONS) ×2
DISSECTOR BALLN SPACEMKR + OVL (BALLOONS) ×1 IMPLANT
DISSECTOR BLUNT TIP ENDO 5MM (MISCELLANEOUS) IMPLANT
DRAPE LAPAROSCOPIC ABDOMINAL (DRAPES) ×2 IMPLANT
ELECT REM PT RETURN 9FT ADLT (ELECTROSURGICAL) ×2
ELECTRODE REM PT RTRN 9FT ADLT (ELECTROSURGICAL) ×1 IMPLANT
GLOVE SURG SIGNA 7.5 PF LTX (GLOVE) ×2 IMPLANT
GOWN STRL REUS W/ TWL LRG LVL3 (GOWN DISPOSABLE) ×2 IMPLANT
GOWN STRL REUS W/ TWL XL LVL3 (GOWN DISPOSABLE) ×1 IMPLANT
GOWN STRL REUS W/TWL LRG LVL3 (GOWN DISPOSABLE) ×4
GOWN STRL REUS W/TWL XL LVL3 (GOWN DISPOSABLE) ×2
KIT BASIN OR (CUSTOM PROCEDURE TRAY) ×2 IMPLANT
KIT TURNOVER KIT B (KITS) ×2 IMPLANT
MESH 3DMAX 4X6 LT LRG (Mesh General) ×2 IMPLANT
MESH 3DMAX 4X6 RT LRG (Mesh General) ×2 IMPLANT
NEEDLE INSUFFLATION 14GA 120MM (NEEDLE) IMPLANT
NS IRRIG 1000ML POUR BTL (IV SOLUTION) ×2 IMPLANT
PAD ARMBOARD 7.5X6 YLW CONV (MISCELLANEOUS) ×2 IMPLANT
SET IRRIG TUBING LAPAROSCOPIC (IRRIGATION / IRRIGATOR) IMPLANT
SET TROCAR LAP APPLE-HUNT 5MM (ENDOMECHANICALS) ×2 IMPLANT
SET TUBE SMOKE EVAC HIGH FLOW (TUBING) ×2 IMPLANT
SUT MNCRL AB 4-0 PS2 18 (SUTURE) ×3 IMPLANT
TOWEL GREEN STERILE (TOWEL DISPOSABLE) ×2 IMPLANT
TOWEL GREEN STERILE FF (TOWEL DISPOSABLE) ×2 IMPLANT
TRAY FOLEY W/BAG SLVR 16FR (SET/KITS/TRAYS/PACK)
TRAY FOLEY W/BAG SLVR 16FR ST (SET/KITS/TRAYS/PACK) IMPLANT
TRAY LAPAROSCOPIC MC (CUSTOM PROCEDURE TRAY) ×2 IMPLANT
WATER STERILE IRR 1000ML POUR (IV SOLUTION) ×2 IMPLANT

## 2020-03-04 NOTE — Transfer of Care (Signed)
Immediate Anesthesia Transfer of Care Note  Patient: Anthony [name]  Procedure(s) Performed: LAPAROSCOPIC BILATERAL INGUINAL HERNIA REPAIR WITH MESH (Bilateral Inguinal)  Patient Location: PACU  Anesthesia Type:General  Level of Consciousness: awake and alert   Airway & Oxygen Therapy: Patient Spontanous Breathing and Patient connected to nasal cannula oxygen  Post-op Assessment: Report given to RN and Post -op Vital signs reviewed and stable  Post vital signs: Reviewed and stable  Last Vitals:  Vitals Value Taken Time  BP 184/113 03/04/20 1016  Temp    Pulse 76 03/04/20 1020  Resp 12 03/04/20 1020  SpO2 100 % 03/04/20 1020  Vitals shown include unvalidated device data.  Last Pain:  Vitals:   03/04/20 0747  TempSrc:   PainSc: 0-No pain         Complications: No complications documented.

## 2020-03-04 NOTE — Anesthesia Preprocedure Evaluation (Signed)
Anesthesia Evaluation  Patient identified by MRN, date of birth, ID band Patient awake    Reviewed: Allergy & Precautions, H&P , NPO status , Patient's Chart, lab work & pertinent test results  Airway Mallampati: II   Neck ROM: full    Dental   Pulmonary COPD, former smoker,    breath sounds clear to auscultation       Cardiovascular hypertension,  Rhythm:regular Rate:Normal     Neuro/Psych PSYCHIATRIC DISORDERS Anxiety Depression    GI/Hepatic GERD  ,  Endo/Other  diabetes, Type 2  Renal/GU      Musculoskeletal  (+) Arthritis ,   Abdominal   Peds  Hematology   Anesthesia Other Findings   Reproductive/Obstetrics                             Anesthesia Physical Anesthesia Plan  ASA: III  Anesthesia Plan: General   Post-op Pain Management:    Induction: Intravenous  PONV Risk Score and Plan: 2 and Ondansetron, Dexamethasone and Treatment may vary due to age or medical condition  Airway Management Planned: Oral ETT  Additional Equipment:   Intra-op Plan:   Post-operative Plan: Extubation in OR  Informed Consent: I have reviewed the patients History and Physical, chart, labs and discussed the procedure including the risks, benefits and alternatives for the proposed anesthesia with the patient or authorized representative who has indicated his/her understanding and acceptance.       Plan Discussed with: CRNA, Anesthesiologist and Surgeon  Anesthesia Plan Comments:         Anesthesia Quick Evaluation

## 2020-03-04 NOTE — Discharge Instructions (Signed)
[facility] _______[facility] ° °UMBILICAL OR INGUINAL HERNIA REPAIR: POST OP INSTRUCTIONS ° °Always review your discharge instruction sheet given to you by the facility where your surgery was performed. °IF YOU HAVE DISABILITY OR FAMILY LEAVE FORMS, YOU MUST BRING THEM TO THE OFFICE FOR PROCESSING.   °DO NOT GIVE THEM TO YOUR DOCTOR. ° °1. A  prescription for pain medication may be given to you upon discharge.  Take your pain medication as prescribed, if needed.  If narcotic pain medicine is not needed, then you may take acetaminophen (Tylenol) or ibuprofen (Advil) as needed. °2. Take your usually prescribed medications unless otherwise directed. °If you need a refill on your pain medication, please contact your pharmacy.  They will contact our office to request authorization. Prescriptions will not be filled after 5 pm or on week-ends. °3. You should follow a light diet the first 24 hours after arrival home, such as soup and crackers, etc.  Be sure to include lots of fluids daily.  Resume your normal diet the day after surgery. °4.Most patients will experience some swelling and bruising around the umbilicus or in the groin and scrotum.  Ice packs and reclining will help.  Swelling and bruising can take several days to resolve.  °6. It is common to experience some constipation if taking pain medication after surgery.  Increasing fluid intake and taking a stool softener (such as Colace) will usually help or prevent this problem from occurring.  A mild laxative (Milk of Magnesia or Miralax) should be taken according to package directions if there are no bowel movements after 48 hours. °7. Unless discharge instructions indicate otherwise, you may remove your bandages 24-48 hours after surgery, and you may shower at that time.  You may have steri-strips (small skin tapes) in place directly over the incision.  These strips should be left on the skin for 7-10 days.  If your surgeon used skin glue on the  incision, you may shower in 24 hours.  The glue will flake off over the next 2-3 weeks.  Any sutures or staples will be removed at the office during your follow-up visit. °8. ACTIVITIES:  You may resume regular (light) daily activities beginning the next day--such as daily self-care, walking, climbing stairs--gradually increasing activities as tolerated.  You may have sexual intercourse when it is comfortable.  Refrain from any heavy lifting or straining until approved by your doctor. ° °a.You may drive when you are no longer taking prescription pain medication, you can comfortably wear a seatbelt, and you can safely maneuver your car and apply brakes. °b.RETURN TO WORK:   °_____________________________________________ ° °9.You should see your doctor in the office for a follow-up appointment approximately 2-3 weeks after your surgery.  Make sure that you call for this appointment within a day or two after you arrive home to insure a convenient appointment time. °10.OTHER INSTRUCTIONS: __OK TO SHOWER STARTING TOMORROW °ICE PACK, TYLENOL, IBUPROFEN ALSO FOR PAIN °NO LIFTING MORE THAN 15 POUNDS FOR 4 WEEKS_______________________ °   _____________________________________ ° °WHEN TO CALL YOUR DOCTOR: °1. Fever over 101.0 °2. Inability to urinate °3. Nausea and/or vomiting °4. Extreme swelling or bruising °5. Continued bleeding from incision. °6. Increased pain, redness, or drainage from the incision ° °The clinic staff is available to answer your questions during regular business hours.  Please don’t hesitate to call and ask to speak to one of the nurses for clinical concerns.  If you have a medical emergency, go to the nearest emergency   room or call 911.  A surgeon from [facility] is always on call at the hospital ° ° °[address], [address], [place], [place]  [address] ? ° [address] [address], [place], [place]   [address] °[phone] ? [phone] ? FAX [phone] °Web site:  [email or web address] °

## 2020-03-04 NOTE — Anesthesia Procedure Notes (Signed)
Procedure Name: Intubation Date/Time: 03/04/2020 9:09 AM Performed by: Eligha Bridegroom, CRNA Pre-anesthesia Checklist: Patient identified, Emergency Drugs available, Suction available, Patient being monitored and Timeout performed Patient Re-evaluated:Patient Re-evaluated prior to induction Oxygen Delivery Method: Circle system utilized Preoxygenation: Pre-oxygenation with 100% oxygen Induction Type: IV induction Ventilation: Mask ventilation without difficulty and Oral airway inserted - appropriate to patient size Laryngoscope Size: Mac and 4 Grade View: Grade II Tube type: Oral Tube size: 7.5 mm Number of attempts: 1 Airway Equipment and Method: Stylet Placement Confirmation: ETT inserted through vocal cords under direct vision,  positive ETCO2 and breath sounds checked- equal and bilateral Secured at: 23 cm Tube secured with: Tape Dental Injury: Teeth and Oropharynx as per pre-operative assessment

## 2020-03-04 NOTE — Interval H&P Note (Signed)
History and Physical Interval Note: no change in H and P  03/04/2020 8:28 AM  [name]  has presented today for surgery, with the diagnosis of bilateral inguinal hernia.  The various methods of treatment have been discussed with the patient and family. After consideration of risks, benefits and other options for treatment, the patient has consented to  Procedure(s): [place] (N/A) as a surgical intervention.  The patient's history has been reviewed, patient examined, no change in status, stable for surgery.  I have reviewed the patient's chart and labs.  Questions were answered to the patient's satisfaction.     [name]

## 2020-03-04 NOTE — Anesthesia Postprocedure Evaluation (Signed)
Anesthesia Post Note  Patient: RUTH TULLY  Procedure(s) Performed: LAPAROSCOPIC BILATERAL INGUINAL HERNIA REPAIR WITH MESH (Bilateral Inguinal)     Patient location during evaluation: PACU Anesthesia Type: General Level of consciousness: awake and alert Pain management: pain level controlled Vital Signs Assessment: post-procedure vital signs reviewed and stable Respiratory status: spontaneous breathing, nonlabored ventilation, respiratory function stable and patient connected to nasal cannula oxygen Cardiovascular status: blood pressure returned to baseline and stable Postop Assessment: no apparent nausea or vomiting Anesthetic complications: no Comments: Pt had a brief episode of chest pressure/pain in PACU upon standing up.  EKG revealed no changes from baseline and was NSR.  The discomfort resolved over a period of about 30 minutes.  Retained intra-abdominal air may be causing this.   No complications documented.  Last Vitals:  Vitals:   03/04/20 1123 03/04/20 1139  BP: 138/73 (!) 142/75  Pulse: 61 61  Resp:  13  Temp:    SpO2: 98% 96%    Last Pain:  Vitals:   03/04/20 1139  TempSrc:   PainSc: 2                  Burnie Therien S

## 2020-03-04 NOTE — Progress Notes (Addendum)
Patient was brought to phase 2 in PACU and upon getting up experienced dizziness, chest pain that felt dull, discomfort and tightness, also has shortness of breath. Hodierne MD came to assess the patient. Patient placed on 2 L of oxygen, set up on the monitor, STAT EKG was performed, labetalol 10 mg was given for the blood pressure of 185/94. Hodierne was given an update and came back to assess the patient. Patient's dizziness, chest pain, and shortness of breath has subsided. Last blood pressure was 142/75. Hodierne cleared the patient to go home and family was updated.

## 2020-03-04 NOTE — Op Note (Signed)
LAPAROSCOPIC BILATERAL INGUINAL HERNIA REPAIR WITH MESH  Procedure Note  [name] 03/04/2020   Pre-op Diagnosis: bilateral inguinal hernia     Post-op Diagnosis: same  Procedure(s): LAPAROSCOPIC BILATERAL INGUINAL HERNIA REPAIR WITH MESH  Surgeon(s): [name], MD  Anesthesia: General  Staff:  Circulator: Jeanie Cooks, RN Relief Circulator: Celene Squibb, RN Scrub Person: Flavia Shipper, CST; [place], Alver Fisher W  Estimated Blood Loss: Minimal  Findings: The patient was found to have bilateral indirect inguinal hernias which were each repaired with a large piece of Prolene 3 DMax mesh from [employer]  Procedure: The patient was brought to the operating room and identifies the correct patient.  He is placed upon the operating table general anesthesia was induced.  His abdomen was then prepped and draped in the usual sterile fashion.  I made a small vertical incision below the umbilicus with a scalpel.  I carried this down to the fascia which was then opened just to the right of the midline.  The rectus muscle was then identified and elevated.  The dissecting balloon was then passed underneath the rectus muscle and manipulated toward the pubis.  The dissecting balloon was then insufflated under direct vision dissecting out the preperitoneal space.  The dissecting balloon was then removed and insufflation was begun with carbon dioxide.  Two 5 mm trochars were placed in the patient's lower midline under direct vision.  I dissected out the right inguinal area first.  The patient had a very thickened indirect hernia sac which I was able to finally reduce from the cord structures.  There was no evidence of direct hernia.  I then dissected out the left inguinal area and the patient had a smaller indirect left inguinal hernia with the sac easily reducing.  I then brought a left-sided piece of 3D max Prolene mesh onto the field.  I placed it through the umbilical trocar and opened  as an onlay on the left inguinal floor.  I then tacked the mesh with several tacks to the Cooper's ligament, up the medial abdominal wall, and slightly laterally.  I then brought a right-sided piece of 3D max mesh onto the field and placed it through the umbilical trocar and opened it on the right inguinal floor.  I likewise tacked it to Cooper's ligament, up the medial abdominal wall, and slightly laterally.  Wide coverage of the defect appeared to be achieved bilaterally.  Hemostasis also appeared to be achieved.  The midline trochars were then removed under direct vision the abdomen was deflated.  All air was relieved from the preperitoneal space and any that it entered the abdominal cavity.  I then closed the fascia the umbilicus with a figure-of-eight 0 Vicryl suture.  I then anesthetized all incisions and performed bilateral ilioinguinal nerve blocks with Marcaine.  I then closed all incisions with 4-0 Monocryl sutures and Dermabond.  The patient tolerated the procedure well.  All the counts were correct at the end of the procedure.  The patient was then extubated in the operating room and taken in stable condition to the recovery room.          [name]   Date: 03/04/2020  Time: 10:16 AM

## 2020-03-05 ENCOUNTER — Encounter (HOSPITAL_COMMUNITY): Payer: Self-pay | Admitting: Surgery

## 2020-04-02 ENCOUNTER — Telehealth: Payer: Self-pay | Admitting: Family Medicine

## 2020-04-02 NOTE — Progress Notes (Signed)
  Chronic Care Management   Outreach Note  04/02/2020 Name: RAJOHN HENERY MRN: [id number] DOB: 01-May-1946  Referred by: [name], MD Reason for referral : No chief complaint on file.   An unsuccessful telephone outreach was attempted today. The patient was referred to the pharmacist for assistance with care management and care coordination.   Follow Up Plan:   Carley Perdue [employer] Scheduler

## 2020-04-05 ENCOUNTER — Telehealth: Payer: Self-pay | Admitting: Family Medicine

## 2020-04-05 NOTE — Progress Notes (Signed)
  Chronic Care Management   Outreach Note  04/05/2020 Name: RAJOHN HENERY MRN: [id number] DOB: 1946-02-01  Referred by: [name], MD Reason for referral : No chief complaint on file.   Third unsuccessful telephone outreach was attempted today. The patient was referred to the pharmacist for assistance with care management and care coordination.   Follow Up Plan:   Carley Perdue [employer] Scheduler

## 2020-04-06 ENCOUNTER — Telehealth: Payer: Self-pay | Admitting: Family Medicine

## 2020-04-06 NOTE — Progress Notes (Signed)
  Chronic Care Management   Outreach Note  04/06/2020 Name: Anthony [name] MRN: [id number] DOB: 1945-12-12  Referred by: [name], MD Reason for referral : No chief complaint on file.   Third unsuccessful telephone outreach was attempted today. The patient was referred to the pharmacist for assistance with care management and care coordination.   Follow Up Plan:   Carley Perdue [employer] Scheduler

## 2020-04-07 ENCOUNTER — Telehealth: Payer: Self-pay | Admitting: Family Medicine

## 2020-04-07 NOTE — Progress Notes (Signed)
  Chronic Care Management   Note  04/07/2020 Name: Anthony [name] MRN: [id number] DOB: [birth year]  [name] is a 74 y.o. year old male who is a primary care patient of [name], MD. I reached out to [name] by phone today in response to a referral sent by [name]'s PCP, [name], MD.   [name] was given information about Chronic Care Management services today including:  1. CCM service includes personalized support from designated clinical staff supervised by his physician, including individualized plan of care and coordination with other care providers 2. 24/7 contact phone numbers for assistance for urgent and routine care needs. 3. Service will only be billed when office clinical staff spend 20 minutes or more in a month to coordinate care. 4. Only one practitioner may furnish and bill the service in a calendar month. 5. The patient may stop CCM services at any time (effective at the end of the month) by phone call to the office staff.   Patient agreed to services and verbal consent obtained.   Follow up plan:   Carley Perdue [employer] Scheduler

## 2020-04-08 ENCOUNTER — Telehealth: Payer: Medicare HMO

## 2020-05-11 ENCOUNTER — Other Ambulatory Visit: Payer: Self-pay | Admitting: Family Medicine

## 2020-05-12 NOTE — Telephone Encounter (Signed)
Refill request Viagra Last refill 01/14/19 #10/3 Last office visit 01/20/20

## 2020-05-13 ENCOUNTER — Other Ambulatory Visit: Payer: Self-pay | Admitting: Family Medicine

## 2020-05-21 DIAGNOSIS — Z7984 Long term (current) use of oral hypoglycemic drugs: Secondary | ICD-10-CM | POA: Diagnosis not present

## 2020-05-21 DIAGNOSIS — G3184 Mild cognitive impairment, so stated: Secondary | ICD-10-CM | POA: Diagnosis not present

## 2020-05-21 DIAGNOSIS — E119 Type 2 diabetes mellitus without complications: Secondary | ICD-10-CM | POA: Diagnosis not present

## 2020-05-21 DIAGNOSIS — I10 Essential (primary) hypertension: Secondary | ICD-10-CM | POA: Diagnosis not present

## 2020-05-21 DIAGNOSIS — Z008 Encounter for other general examination: Secondary | ICD-10-CM | POA: Diagnosis not present

## 2020-05-21 DIAGNOSIS — K219 Gastro-esophageal reflux disease without esophagitis: Secondary | ICD-10-CM | POA: Diagnosis not present

## 2020-05-21 DIAGNOSIS — N529 Male erectile dysfunction, unspecified: Secondary | ICD-10-CM | POA: Diagnosis not present

## 2020-05-21 DIAGNOSIS — E669 Obesity, unspecified: Secondary | ICD-10-CM | POA: Diagnosis not present

## 2020-05-21 DIAGNOSIS — M1990 Unspecified osteoarthritis, unspecified site: Secondary | ICD-10-CM | POA: Diagnosis not present

## 2020-05-21 DIAGNOSIS — E785 Hyperlipidemia, unspecified: Secondary | ICD-10-CM | POA: Diagnosis not present

## 2020-05-21 DIAGNOSIS — Z6830 Body mass index (BMI) 30.0-30.9, adult: Secondary | ICD-10-CM | POA: Diagnosis not present

## 2020-06-02 ENCOUNTER — Other Ambulatory Visit: Payer: Self-pay

## 2020-06-02 ENCOUNTER — Encounter: Payer: Self-pay | Admitting: Podiatry

## 2020-06-02 ENCOUNTER — Ambulatory Visit (INDEPENDENT_AMBULATORY_CARE_PROVIDER_SITE_OTHER): Payer: Medicare HMO | Admitting: Podiatry

## 2020-06-02 DIAGNOSIS — E1142 Type 2 diabetes mellitus with diabetic polyneuropathy: Secondary | ICD-10-CM | POA: Diagnosis not present

## 2020-06-02 DIAGNOSIS — E119 Type 2 diabetes mellitus without complications: Secondary | ICD-10-CM

## 2020-06-02 DIAGNOSIS — M2011 Hallux valgus (acquired), right foot: Secondary | ICD-10-CM

## 2020-06-02 DIAGNOSIS — M2042 Other hammer toe(s) (acquired), left foot: Secondary | ICD-10-CM | POA: Diagnosis not present

## 2020-06-02 DIAGNOSIS — B351 Tinea unguium: Secondary | ICD-10-CM | POA: Diagnosis not present

## 2020-06-02 DIAGNOSIS — M2041 Other hammer toe(s) (acquired), right foot: Secondary | ICD-10-CM

## 2020-06-02 DIAGNOSIS — M2012 Hallux valgus (acquired), left foot: Secondary | ICD-10-CM | POA: Diagnosis not present

## 2020-06-02 DIAGNOSIS — M79675 Pain in left toe(s): Secondary | ICD-10-CM | POA: Diagnosis not present

## 2020-06-02 DIAGNOSIS — L84 Corns and callosities: Secondary | ICD-10-CM

## 2020-06-02 DIAGNOSIS — M79674 Pain in right toe(s): Secondary | ICD-10-CM

## 2020-06-06 NOTE — Progress Notes (Signed)
ANNUAL DIABETIC FOOT EXAM  Subjective: Anthony [name] presents today for for annual diabetic foot examination, at risk foot care with history of diabetic neuropathy and painful callus(es)  b/l and painful thick toenails that are difficult to trim. Painful toenails interfere with ambulation. Aggravating factors include wearing enclosed shoe gear. Pain is relieved with periodic professional debridement. Painful calluses are aggravated when weightbearing with and without shoegear. Pain is relieved with periodic professional debridement.[name] Kitchen  He did not check blood sugar, but states he needs to follow up with his Endocrinologist because he has had some low blood sugars. He admits to skipping meals, but still taking his diabetes medication. This has interfered with his driving at times.   [name], MD is patient's PCP. Last visit was 01/20/2020. He was seeing [name] and last visit was 09/25/2019.  He is also requesting new diabetic shoes on today's visit.  Past Medical History:  Diagnosis Date  . Anxiety    denies  . Arthritis    a little  . Barrett's esophagus 12/08/2015   By EGD 2010 [name])   . BPH with obstruction/lower urinary tract symptoms 2014   s/p TURP  . Depression    denies  . DKA (diabetic ketoacidoses) 01/2010   "in coma"  . GERD (gastroesophageal reflux disease)   . H/O bronchitis    after every cold  . HTN (hypertension)   . Hyperlipidemia    controlled with medicine  . Otitis media   . Rheumatic fever    maybe as child  . Type 2 diabetes, uncontrolled, with mild 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 macular edema ([name])    completed DMSE    Patient Active Problem List   Diagnosis Date Noted  . COPD (chronic obstructive pulmonary disease) ([place]) 02/28/2020  . Coronary artery calcification seen on CAT scan 02/28/2020  . Aortic atherosclerosis ([place]) 02/28/2020  . Ex-smoker 01/21/2020  . RLQ abdominal pain 01/21/2020  . Numbness and tingling  in left arm 01/21/2020  . Health maintenance examination 01/05/2017  . Right knee pain 01/18/2016  . Barrett's esophagus 12/08/2015  . Abnormal tympanic membrane of right ear 07/05/2015  . Advanced care planning/counseling discussion 06/30/2014  . Medicare annual wellness visit, subsequent 06/22/2013  . Benign prostatic hyperplasia with urinary obstruction 06/18/2013  . ED (erectile dysfunction) 04/10/2012  . Hyperlipidemia associated with type 2 diabetes mellitus ([place]) 12/29/2008  . LEG PAIN, BILATERAL 11/19/2008  . 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 ([place]) 07/24/2007  . ANXIETY 07/24/2007  . Essential hypertension 07/24/2007  . GERD 07/24/2007    Past Surgical History:  Procedure Laterality Date  . ABI  12/2013   WNL  . APPENDECTOMY    . CHOLECYSTECTOMY     years ago  . COLONOSCOPY  01/2009   TA, rec rpt 5 yrs [name])  . COLONOSCOPY  01/2018   TAx2, diverticulosis, int hemorrhoids, rpt 5 yrs ([name])  . ESOPHAGOGASTRODUODENOSCOPY  01/2009   biopsy with Barrett's  . ESOPHAGOGASTRODUODENOSCOPY  01/2018   WNL - no barrett's ([name])  . INGUINAL HERNIA REPAIR Bilateral 03/04/2020   Procedure: LAPAROSCOPIC BILATERAL INGUINAL HERNIA REPAIR WITH MESH;  Surgeon: [name], MD;  Location: [facility];  Service: General;  Laterality: Bilateral;  . LAPAROSCOPIC APPENDECTOMY  07/05/2011   Procedure: APPENDECTOMY LAPAROSCOPIC;  Surgeon: [name], MD;  Location: [facility];  Service: General;  Laterality: N/A;  . MENISCUS REPAIR Right 2018   [name] @ [facility]  . TRANSURETHRAL RESECTION OF PROSTATE N/A 07/28/2013  Procedure: TRANSURETHRAL RESECTION OF THE PROSTATE WITH GYRUS INSTRUMENTS;  Surgeon: [name], MD  . VASECTOMY    . WISDOM TOOTH EXTRACTION      Current Outpatient Medications on File Prior to Visit  Medication Sig Dispense Refill  . amLODipine (NORVASC) 5 MG tablet Take 1 tablet (5 mg total) by mouth daily. 90  tablet 3  . b complex vitamins tablet Take 1 tablet by mouth daily.    [name] Kitchen Dextrose, Diabetic Use, (GLUCOSE PO) Take 1 tablet by mouth as needed.    [name] Kitchen glucagon (GLUCAGON EMERGENCY) 1 MG injection Inject 1 mg into the muscle once as needed for up to 1 dose. 1 each 12  . insulin aspart (NOVOLOG FLEXPEN) 100 UNIT/ML FlexPen Inject 10-18 Units into the skin 3 (three) times daily with meals. (Patient taking differently: Inject 10-14 Units into the skin 3 (three) times daily with meals. ) 48.6 mL 1  . Insulin Pen Needle (ADVOCATE INSULIN PEN NEEDLES) 31G X 5 MM MISC Use 3x a day 300 each 3  . olmesartan-hydrochlorothiazide (BENICAR HCT) 40-25 MG tablet Take 1 tablet by mouth daily. 90 tablet 3  . ONETOUCH ULTRA test strip USE AS INSTRUCTED 4X A DAY. DAY SUPPLY PER INSURANCE. 300 strip 1  . pantoprazole (PROTONIX) 40 MG tablet Take 1 tablet (40 mg total) by mouth daily. 90 tablet 3  . sildenafil (VIAGRA) 100 MG tablet TAKE 0.5-1 TABLETS (50-100 MG TOTAL) BY MOUTH DAILY AS NEEDED FOR ERECTILE DYSFUNCTION. 10 tablet 3  . simvastatin (ZOCOR) 20 MG tablet TAKE 1 TABLET BY MOUTH EVERYDAY AT BEDTIME (Patient taking differently: Take 20 mg by mouth at bedtime. TAKE 1 TABLET BY MOUTH EVERYDAY AT BEDTIME) 90 tablet 3  . traMADol (ULTRAM) 50 MG tablet Take 50 mg by mouth every 6 (six) hours as needed for severe pain.     . traMADol (ULTRAM) 50 MG tablet Take 1 tablet (50 mg total) by mouth every 6 (six) hours as needed for moderate pain. 25 tablet 0  . TRESIBA FLEXTOUCH 200 UNIT/ML FlexTouch Pen INJECT 32 UNITS INTO THE SKIN DAILY. (Patient taking differently: Inject 36 Units into the skin every evening. ) 9 pen 2  . metFORMIN (GLUCOPHAGE-XR) 500 MG 24 hr tablet Take 2 tablets (1,000 mg total) by mouth daily. TAKE 2 TABLETS BY MOUTH TWICE DAILY WITH MEALS (Patient not taking: Reported on 06/02/2020) 360 tablet 3   No current facility-administered medications on file prior to visit.     No Known Allergies  Social  History   Occupational History  . Not on file  Tobacco Use  . Smoking status: Former Smoker    Packs/day: 0.30    Years: 50.00    Pack years: 15.00    Types: Cigarettes    Quit date: 08/29/2011    Years since quitting: 8.7  . Smokeless tobacco: Never Used  Vaping Use  . Vaping Use: Never used  Substance and Sexual Activity  . Alcohol use: Yes    Alcohol/week: 7.0 standard drinks    Types: 7 Standard drinks or equivalent per week    Comment: 1- 2 ounces a night  . Drug use: No  . Sexual activity: Not Currently    Family History  Problem Relation Age of Onset  . Lymphoma Mother        Non-hodgkins  . Coronary artery disease Mother   . Heart disease Mother   . Cancer Father   . Alcohol abuse Father   . Diabetes Brother   .  Pancreatic cancer Maternal Uncle   . Diabetes Brother   . Diabetes Brother   . Diabetes Other   . Colon cancer Neg Hx   . Lung cancer Neg Hx   . Esophageal cancer Neg Hx   . Rectal cancer Neg Hx   . Stomach cancer Neg Hx     Immunization History  Administered Date(s) Administered  . Influenza Whole 06/02/2008, 06/02/2009, 06/02/2010, 07/08/2011, 07/08/2012  . Influenza, High Dose Seasonal PF 05/18/2017, 06/25/2018, 04/18/2019  . Influenza,inj,Quad PF,6+ Mos 07/05/2015  . Influenza-Unspecified 05/28/2013, 05/28/2014, 06/30/2016  . PFIZER SARS-COV-2 Vaccination 10/27/2019, 11/25/2019  . Pneumococcal Conjugate-13 06/18/2013  . Pneumococcal Polysaccharide-23 07/08/2011, 12/29/2016  . Tetanus 06/18/2013  . Zoster 08/26/2014  . Zoster Recombinat (Shingrix) 05/18/2017, 01/14/2018     Objective: There were no vitals filed for this visit.  BRIT WERNETTE is a pleasant 74 y.o. Caucasian  male in NAD. AAO X 3.  Vascular Examination: Capillary refill time to digits immediate b/l. Palpable pedal pulses b/l LE. Pedal hair absent. Lower extremity skin temperature gradient within normal limits. No pain with calf compression b/l. No edema noted b/l  lower extremities.  Dermatological Examination: Pedal skin with normal turgor, texture and tone bilaterally. No open wounds bilaterally. No interdigital macerations bilaterally. Toenails 1-5 b/l elongated, discolored, dystrophic, thickened, crumbly with subungual debris and tenderness to dorsal palpation. Hyperkeratotic lesion(s) submet head 1 left foot and submet head 1 right foot.  No erythema, no edema, no drainage, no flocculence.  Musculoskeletal Examination: Normal muscle strength 5/5 to all lower extremity muscle groups bilaterally. No pain crepitus or joint limitation noted with ROM b/l. Hallux valgus with bunion deformity noted b/l lower extremities. Hammertoes noted to the 2-5 bilaterally.  Footwear Assessment: Does the patient wear appropriate shoes? Yes. Does the patient need inserts/orthotics? Yes.  Neurological Examination: Protective sensation diminished with 10g monofilament b/l. Clonus negative b/l.  Hemoglobin A1C Latest Ref Rng & Units 03/02/2020 01/12/2020  HGBA1C 4.8 - 5.6 % 7.7(H) 7.8(H)  Some recent data might be hidden   Assessment: 1. Pain due to onychomycosis of toenails of both feet   2. Callus   3. Hallux valgus, acquired, bilateral   4. Acquired hammertoes of both feet   5. Diabetic peripheral neuropathy associated with type 2 diabetes mellitus ([place])   6. Encounter for diabetic foot exam ([place])     ADA Risk Categorization: High Risk  Patient has one or more of the following: Loss of protective sensation Absent pedal pulses Severe Foot deformity History of foot ulcer  Plan: -Examined patient. -Advised Mr. Batzel to reach out to his PCP or Endocrinologist egarding his episodes of hypoglycemia. He admits to skipping meals and taking his diabetes meds. Advised him it would be good to let his doctor/nurse know about skipping meals. He related understanding. -Diabetic foot examination performed on today's visit. -Toenails 1-5 b/l were debrided in length and  girth with sterile nail nippers and dremel without iatrogenic bleeding.  -Callus(es) submet head 1 left foot and submet head 1 right foot pared utilizing sterile scalpel blade without complication or incident. Total number debrided =2. -Schedule appointment with Pedorthist for new diabetic shoes.  -Patient to report any pedal injuries to medical professional immediately. -Patient to continue soft, supportive shoe gear daily. -Patient/POA to call should there be question/concern in the interim.  Return in about 3 months (around 09/02/2020).  Marzetta Board, DPM

## 2020-06-17 ENCOUNTER — Ambulatory Visit: Payer: Medicare HMO | Admitting: Orthotics

## 2020-06-17 ENCOUNTER — Other Ambulatory Visit: Payer: Self-pay

## 2020-06-17 DIAGNOSIS — M2011 Hallux valgus (acquired), right foot: Secondary | ICD-10-CM

## 2020-06-17 DIAGNOSIS — M2041 Other hammer toe(s) (acquired), right foot: Secondary | ICD-10-CM

## 2020-06-17 DIAGNOSIS — M2042 Other hammer toe(s) (acquired), left foot: Secondary | ICD-10-CM

## 2020-06-17 DIAGNOSIS — M2012 Hallux valgus (acquired), left foot: Secondary | ICD-10-CM

## 2020-06-17 NOTE — Progress Notes (Signed)
Patient was measured for med necessity extra depth shoes (x2) w/ 3 pair (x6) custom f/o. Patient was measured w/ [place] to determine size and width.  Foam impression mold was achieved and deemed appropriate for fabrication of  cmfo.   See DPM chart notes for further documentation and dx codes for determination of medical necessity.  Appropriate forms will be sent to PCP to verify and sign off on medical necessity.  

## 2020-06-21 DIAGNOSIS — R69 Illness, unspecified: Secondary | ICD-10-CM | POA: Diagnosis not present

## 2020-06-23 ENCOUNTER — Telehealth: Payer: Self-pay | Admitting: Family Medicine

## 2020-06-23 NOTE — Telephone Encounter (Signed)
Attempted to contact pt.  No answer.  No vm.  Need to relay Dr. G's message.  

## 2020-06-23 NOTE — Telephone Encounter (Signed)
Received request to fill out form for diabetic shoes. I filled out form.  He has not seen endo for DM management since 08/2019 and has h/o hypoglycemia. Please call and remind him to schedule endo appt for DM f/u as overdue Cruzita Lederer).

## 2020-06-24 NOTE — Telephone Encounter (Signed)
Spoke with pt relaying Dr. Synthia Innocent message.  Pt verbalizes understanding and will call Dr. Cruzita Lederer. He expresses his thanks.  FYI to Dr. Darnell Level.

## 2020-07-05 ENCOUNTER — Telehealth: Payer: Self-pay

## 2020-07-05 NOTE — Progress Notes (Addendum)
Chronic Care Management Pharmacy Assistant   Name: Anthony [name]  MRN: [id number] DOB: Nov 05, 1945  Reason for Encounter: Initial Questions for CCM Visit   Patient Questions:  1.  Have you seen any other providers since your last visit? Yes, Anthony [name]; [address] Anthony [name], Anthony [name]  2.  Any changes in your medicines or health? Yes, Bilateral Inguinal hernia repair   PCP : Anthony Bush, MD  Allergies:  No Known Allergies  Medications: Outpatient Encounter Medications as of 07/05/2020  Medication Sig   amLODipine (NORVASC) 5 MG tablet Take 1 tablet (5 mg total) by mouth daily.   b complex vitamins tablet Take 1 tablet by mouth daily.   Dextrose, Diabetic Use, (GLUCOSE PO) Take 1 tablet by mouth as needed.   glucagon (GLUCAGON EMERGENCY) 1 MG injection Inject 1 mg into the muscle once as needed for up to 1 dose.   insulin aspart (NOVOLOG FLEXPEN) 100 UNIT/ML FlexPen Inject 10-18 Units into the skin 3 (three) times daily with meals. (Patient taking differently: Inject 10-14 Units into the skin 3 (three) times daily with meals. )   Insulin Pen Needle (ADVOCATE INSULIN PEN NEEDLES) 31G X 5 MM MISC Use 3x a day   metFORMIN (GLUCOPHAGE-XR) 500 MG 24 hr tablet Take 2 tablets (1,000 mg total) by mouth daily. TAKE 2 TABLETS BY MOUTH TWICE DAILY WITH MEALS (Patient not taking: Reported on 06/02/2020)   olmesartan-hydrochlorothiazide (BENICAR HCT) 40-25 MG tablet Take 1 tablet by mouth daily.   ONETOUCH ULTRA test strip USE AS INSTRUCTED 4X A DAY. DAY SUPPLY PER INSURANCE.   pantoprazole (PROTONIX) 40 MG tablet Take 1 tablet (40 mg total) by mouth daily.   sildenafil (VIAGRA) 100 MG tablet TAKE 0.5-1 TABLETS (50-100 MG TOTAL) BY MOUTH DAILY AS NEEDED FOR ERECTILE DYSFUNCTION.   simvastatin (ZOCOR) 20 MG tablet TAKE 1 TABLET BY MOUTH EVERYDAY AT BEDTIME (Patient taking differently: Take 20 mg by mouth at bedtime. TAKE 1 TABLET BY MOUTH EVERYDAY AT BEDTIME)   traMADol  (ULTRAM) 50 MG tablet Take 50 mg by mouth every 6 (six) hours as needed for severe pain.    traMADol (ULTRAM) 50 MG tablet Take 1 tablet (50 mg total) by mouth every 6 (six) hours as needed for moderate pain.   TRESIBA FLEXTOUCH 200 UNIT/ML FlexTouch Pen INJECT 32 UNITS INTO THE SKIN DAILY. (Patient taking differently: Inject 36 Units into the skin every evening. )   No facility-administered encounter medications on file as of 07/05/2020.    Current Diagnosis: Patient Active Problem List   Diagnosis Date Noted   COPD (chronic obstructive pulmonary disease) ([place]) 02/28/2020   Coronary artery calcification seen on CAT scan 02/28/2020   Aortic atherosclerosis ([place]) 02/28/2020   Ex-smoker 01/21/2020   RLQ abdominal pain 01/21/2020   Numbness and tingling in left arm 01/21/2020   Health maintenance examination 01/05/2017   Right knee pain 01/18/2016   Barrett's esophagus 12/08/2015   Abnormal tympanic membrane of right ear 07/05/2015   Advanced care planning/counseling discussion 06/30/2014   Medicare annual wellness visit, subsequent 06/22/2013   Benign prostatic hyperplasia with urinary obstruction 06/18/2013   ED (erectile dysfunction) 04/10/2012   Hyperlipidemia associated with type 2 diabetes mellitus ([place]) 12/29/2008   LEG PAIN, BILATERAL 11/19/2008   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 ([place]) 07/24/2007   ANXIETY 07/24/2007   Essential hypertension 07/24/2007   GERD 07/24/2007    Goals Addressed   None     Follow-Up:  Pharmacist Review   Have you seen any other providers since your last visit? Yes, see above   Any changes in your medications or health? Having trouble with hypoglycemic episodes. Blood sugar will drop so low he gets confused.   Any side effects from any medications? No  Do you have an symptoms or problems not managed by your medications? Yes, would like to find a solution for ED that is not so expensive and that  works.  Any concerns about your health right now? Yes  Has your provider asked that you check blood pressure, blood sugar, or follow special diet at home? TID blood sugar check, does not check blood pressure, adherence to diabetic diet is attempted but not strictly followed  Do you get any type of exercise on a regular basis? 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 Feel like he is 22 again and enjoy life  Do you have any problems getting your medications? Anthony [name] is high, would like to avoid donut hole if possible  Is there anything that you would like to discuss during the appointment? Very interested in a continuous blood glucose monitor; wife is experiencing what he describes as "dementia" and very defensive about it. However, he is afraid if there is trouble with blood glucose events she may not be able to assist by remembering what to do or how to get home from errands. Would like a way to know his trends before he gets to trouble areas  Patient reminded to please have medications and supplements available during CCM appointment.  [facility], CMA  I have reviewed the care management and care coordination activities outlined in this encounter and I am certifying that I agree with the content of this note. No further action required.  Debbora Dus, PharmD Clinical Pharmacist [facility] [phone]
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
## 2020-07-06 ENCOUNTER — Ambulatory Visit: Payer: Medicare HMO

## 2020-07-06 ENCOUNTER — Other Ambulatory Visit: Payer: Self-pay

## 2020-07-06 DIAGNOSIS — E113299 Type 2 diabetes mellitus with mild nonproliferative diabetic retinopathy without macular edema, unspecified eye: Secondary | ICD-10-CM

## 2020-07-06 DIAGNOSIS — E1169 Type 2 diabetes mellitus with other specified complication: Secondary | ICD-10-CM

## 2020-07-06 DIAGNOSIS — E785 Hyperlipidemia, unspecified: Secondary | ICD-10-CM

## 2020-07-08 ENCOUNTER — Telehealth: Payer: Self-pay

## 2020-07-08 NOTE — Chronic Care Management (AMB) (Signed)
Chronic Care Management Pharmacy Assistant   Name: Anthony [name]  MRN: [id number] DOB: [birth year]  Reason for Encounter: Medication Review  PCP : [name], MD   Medication Cost review performed for the following medications: Tresiba Flextouch Novolog Flexpen Olmesartan/HCTZ 40/25 mg Metformin XR 500 mg  Tramadol 50 mg   Allergies:  No Known Allergies  Medications: Outpatient Encounter Medications as of 07/08/2020  Medication Sig   amLODipine (NORVASC) 5 MG tablet Take 1 tablet (5 mg total) by mouth daily.   b complex vitamins tablet Take 1 tablet by mouth daily.   Dextrose, Diabetic Use, (GLUCOSE PO) Take 1 tablet by mouth as needed.   glucagon (GLUCAGON EMERGENCY) 1 MG injection Inject 1 mg into the muscle once as needed for up to 1 dose.   insulin aspart (NOVOLOG FLEXPEN) 100 UNIT/ML FlexPen Inject 10-18 Units into the skin 3 (three) times daily with meals. (Patient taking differently: Inject 10-14 Units into the skin 3 (three) times daily with meals. )   Insulin Pen Needle (ADVOCATE INSULIN PEN NEEDLES) 31G X 5 MM MISC Use 3x a day   metFORMIN (GLUCOPHAGE-XR) 500 MG 24 hr tablet Take 2 tablets (1,000 mg total) by mouth daily. TAKE 2 TABLETS BY MOUTH TWICE DAILY WITH MEALS (Patient not taking: Reported on 06/02/2020)   olmesartan-hydrochlorothiazide (BENICAR HCT) 40-25 MG tablet Take 1 tablet by mouth daily.   ONETOUCH ULTRA test strip USE AS INSTRUCTED 4X A DAY. DAY SUPPLY PER INSURANCE.   pantoprazole (PROTONIX) 40 MG tablet Take 1 tablet (40 mg total) by mouth daily.   sildenafil (VIAGRA) 100 MG tablet TAKE 0.5-1 TABLETS (50-100 MG TOTAL) BY MOUTH DAILY AS NEEDED FOR ERECTILE DYSFUNCTION.   simvastatin (ZOCOR) 20 MG tablet TAKE 1 TABLET BY MOUTH EVERYDAY AT BEDTIME (Patient taking differently: Take 20 mg by mouth at bedtime. TAKE 1 TABLET BY MOUTH EVERYDAY AT BEDTIME)   traMADol (ULTRAM) 50 MG tablet Take 50 mg by mouth every 6 (six) hours as  needed for severe pain.    traMADol (ULTRAM) 50 MG tablet Take 1 tablet (50 mg total) by mouth every 6 (six) hours as needed for moderate pain.   TRESIBA FLEXTOUCH 200 UNIT/ML FlexTouch Pen INJECT 32 UNITS INTO THE SKIN DAILY. (Patient taking differently: Inject 36 Units into the skin every evening. )   No facility-administered encounter medications on file as of 07/08/2020.    Current Diagnosis: Patient Active Problem List   Diagnosis Date Noted   COPD (chronic obstructive pulmonary disease) ([place]) 02/28/2020   Coronary artery calcification seen on CAT scan 02/28/2020   Aortic atherosclerosis ([place]) 02/28/2020   Ex-smoker 01/21/2020   RLQ abdominal pain 01/21/2020   Numbness and tingling in left arm 01/21/2020   Health maintenance examination 01/05/2017   Right knee pain 01/18/2016   Barrett's esophagus 12/08/2015   Abnormal tympanic membrane of right ear 07/05/2015   Advanced care planning/counseling discussion 06/30/2014   Medicare annual wellness visit, subsequent 06/22/2013   Benign prostatic hyperplasia with urinary obstruction 06/18/2013   ED (erectile dysfunction) 04/10/2012   Hyperlipidemia associated with type 2 diabetes mellitus ([place]) 12/29/2008   LEG PAIN, BILATERAL 11/19/2008   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 ([place]) 07/24/2007   ANXIETY 07/24/2007   Essential hypertension 07/24/2007   GERD 07/24/2007     Follow-Up:  Medication Cost Review  [facility] saves patient more money on the medications reviewed above than [facility]. Debbora Dus, CPP notified.  Pattricia Boss, [place] Clinical Pharmacist  Assistant [phone]

## 2020-07-08 NOTE — Patient Instructions (Addendum)
July 08, 2020  Dear Anthony [name],  It was a pleasure meeting you during our initial appointment on July 08, 2020. Below is a summary of the goals we discussed and components of chronic care management. Please contact me anytime with questions or concerns.   Visit Information  Goals Addressed            This Visit's Progress   . Pharmacy Care Plan       CARE PLAN ENTRY (see longitudinal plan of care for additional care plan information)  Current Barriers:  . Chronic Disease Management support, education, and care coordination needs related to Hyperlipidemia and Diabetes   Hyperlipidemia Lab Results  Component Value Date/Time   LDLCALC 58 01/12/2020 10:21 AM   LDLDIRECT 94.0 10/05/2016 05:28 PM   . Pharmacist Clinical Goal(s): o Over the next 4 weeks, patient will work with PharmD and providers to maintain LDL goal < 100 . Current regimen:  o Simvastatin 20 mg - 1 tablet daily  . Interventions: o Reviewed adherence, refills timely . Patient self care activities - Over the next 4 weeks, patient will: o Continue medication as prescribed . Slowly increase exercise with goal of 30 minutes, 5 days per week . Incorporate a healthy diet high in vegetables, fruits and whole grains with low-fat dairy products, chicken, fish, legumes, non-tropical vegetable oils and nuts. Limit intake of sweets, sugar-sweetened beverages and red meats.  Diabetes Lab Results  Component Value Date/Time   HGBA1C 7.7 (H) 03/02/2020 10:26 AM   HGBA1C 7.8 (H) 01/12/2020 10:21 AM .  Pharmacist Clinical Goal(s): o Over the next 4 weeks, patient will work with PharmD and providers to achieve A1c goal <7% . Current regimen:   Metformin 500 mg - 1 tablet daily  Insulin aspart (NovoLog) - 10-14 units 5-10 minutes before meals   Tresiba - 36 units daily . Interventions: o Adjust medication timing to prevent hypoglycemia o Trial Dexcom sample o Apply for patient assistance  -Tresiba . Patient self care activities - Over the next 4 weeks, patient will: o Check blood sugar 3-4 times daily, document, and provide at future appointments o Write down any low blood glucose readings o Take Novolog 5-10 minutes before meals o Try to eat lunch earlier in the day and if unable, bring a snack with you  o Contact provider with any episodes of hypoglycemia  Initial goal documentation       Anthony [name] was given information about Chronic Care Management services today including:  1. CCM service includes personalized support from designated clinical staff supervised by his physician, including individualized plan of care and coordination with other care providers 2. 24/7 contact phone numbers for assistance for urgent and routine care needs. 3. Standard insurance, coinsurance, copays and deductibles apply for chronic care management only during months in which we provide at least 20 minutes of these services. Most insurances cover these services at 100%, however patients may be responsible for any copay, coinsurance and/or deductible if applicable. This service may help you avoid the need for more expensive face-to-face services. 4. Only one practitioner may furnish and bill the service in a calendar month. 5. The patient may stop CCM services at any time (effective at the end of the month) by phone call to the office staff.  Patient agreed to services and verbal consent obtained.   The patient verbalized understanding of instructions provided today and agreed to receive a mailed copy of patient instruction and/or educational materials. Telephone follow up  appointment with pharmacy team member scheduled for: 08/25/20 at 8:30 AM telephone visit  Debbora Dus, PharmD Clinical Pharmacist [facility] [phone]   Hypoglycemia Hypoglycemia occurs when the level of sugar (glucose) in the blood is too low. Hypoglycemia can happen in people who do  or do not have diabetes. It can develop quickly, and it can be a medical emergency. For most people with diabetes, a blood glucose level below 70 mg/dL (3.9 mmol/L) is considered hypoglycemia. Glucose is a type of sugar that provides the body's main source of energy. Certain hormones (insulin and glucagon) control the level of glucose in the blood. Insulin lowers blood glucose, and glucagon raises blood glucose. Hypoglycemia can result from having too much insulin in the bloodstream, or from not eating enough food that contains glucose. You may also have reactive hypoglycemia, which happens within 4 hours after eating a meal. What are the causes? Hypoglycemia occurs most often in people who have diabetes and may be caused by:  Diabetes medicine.  Not eating enough, or not eating often enough.  Increased physical activity.  Drinking alcohol on an empty stomach. If you do not have diabetes, hypoglycemia may be caused by:  A tumor in the pancreas.  Not eating enough, or not eating for long periods at a time (fasting).  A severe infection or illness.  Certain medicines. What increases the risk? Hypoglycemia is more likely to develop in:  People who have diabetes and take medicines to lower blood glucose.  People who abuse alcohol.  People who have a severe illness. What are the signs or symptoms? Mild symptoms Mild hypoglycemia may not cause any symptoms. If you do have symptoms, they may include:  Hunger.  Anxiety.  Sweating and feeling clammy.  Dizziness or feeling light-headed.  Sleepiness.  Nausea.  Increased heart rate.  Headache.  Blurry vision.  Irritability.  Tingling or numbness around the mouth, lips, or tongue.  A change in coordination.  Restless sleep. Moderate symptoms Moderate hypoglycemia can cause:  Mental confusion and poor judgment.  Behavior changes.  Weakness.  Irregular heartbeat. Severe symptoms Severe hypoglycemia is a medical  emergency. It can cause:  Fainting.  Seizures.  Loss of consciousness (coma).  Death. How is this diagnosed? Hypoglycemia is diagnosed with a blood test to measure your blood glucose level. This blood test is done while you are having symptoms. Your health care provider may also do a physical exam and review your medical history. How is this treated? This condition can often be treated by immediately eating or drinking something that contains sugar, such as:  Fruit juice, 4-6 oz (120-150 mL).  Regular soda (not diet soda), 4-6 oz (120-150 mL).  Low-fat milk, 4 oz (120 mL).  Several pieces of hard candy.  Sugar or honey, 1 Tbsp (15 mL). Treating hypoglycemia if you have diabetes If you are alert and able to swallow safely, follow the 15:15 rule:  Take 15 grams of a rapid-acting carbohydrate. Talk with your health care provider about how much you should take.  Rapid-acting options include: ? Glucose pills (take 15 grams). ? 6-8 pieces of hard candy. ? 4-6 oz (120-150 mL) of fruit juice. ? 4-6 oz (120-150 mL) of regular (not diet) soda. ? 1 Tbsp (15 mL) honey or sugar.  Check your blood glucose 15 minutes after you take the carbohydrate.  If the repeat blood glucose level is still at or below 70 mg/dL (3.9 mmol/L), take 15 grams of a carbohydrate  again.  If your blood glucose level does not increase above 70 mg/dL (3.9 mmol/L) after 3 tries, seek emergency medical care.  After your blood glucose level returns to normal, eat a meal or a snack within 1 hour.  Treating severe hypoglycemia Severe hypoglycemia is when your blood glucose level is at or below 54 mg/dL (3 mmol/L). Severe hypoglycemia is a medical emergency. Get medical help right away. If you have severe hypoglycemia and you cannot eat or drink, you may need an injection of glucagon. A family member or close friend should learn how to check your blood glucose and how to give you a glucagon injection. Ask your health  care provider if you need to have an emergency glucagon injection kit available. Severe hypoglycemia may need to be treated in a hospital. The treatment may include getting glucose through an IV. You may also need treatment for the cause of your hypoglycemia. Follow these instructions at home:  General instructions  Take over-the-counter and prescription medicines only as told by your health care provider.  Monitor your blood glucose as told by your health care provider.  Limit alcohol intake to no more than 1 drink a day for nonpregnant women and 2 drinks a day for men. One drink equals 12 oz of beer (355 mL), 5 oz of wine (148 mL), or 1 oz of hard liquor (44 mL).  Keep all follow-up visits as told by your health care provider. This is important. If you have diabetes:  Always have a rapid-acting carbohydrate snack with you to treat low blood glucose.  Follow your diabetes management plan as directed. Make sure you: ? Know the symptoms of hypoglycemia. It is important to treat it right away to prevent it from becoming severe. ? Take your medicines as directed. ? Follow your exercise plan. ? Follow your meal plan. Eat on time, and do not skip meals. ? Check your blood glucose as often as directed. Always check before and after exercise. ? Follow your sick day plan whenever you cannot eat or drink normally. Make this plan in advance with your health care provider.  Share your diabetes management plan with people in your workplace, school, and household.  Check your urine for ketones when you are ill and as told by your health care provider.  Carry a medical alert card or wear medical alert jewelry. Contact a health care provider if:  You have problems keeping your blood glucose in your target range.  You have frequent episodes of hypoglycemia. Get help right away if:  You continue to have hypoglycemia symptoms after eating or drinking something containing glucose.  Your blood  glucose is at or below 54 mg/dL (3 mmol/L).  You have a seizure.  You faint. These symptoms may represent a serious problem that is an emergency. Do not wait to see if the symptoms will go away. Get medical help right away. Call your local emergency services (911 in the [place] Summary  Hypoglycemia occurs when the level of sugar (glucose) in the blood is too low.  Hypoglycemia can happen in people who do or do not have diabetes. It can develop quickly, and it can be a medical emergency.  Make sure you know the symptoms of hypoglycemia and how to treat it.  Always have a rapid-acting carbohydrate snack with you to treat low blood sugar. This information is not intended to replace advice given to you by your health care provider. Make sure you discuss any questions you have with  your health care provider. Document Revised: 02/04/2018 Document Reviewed: 09/17/2015 Elsevier Patient Education  2020 [employer].

## 2020-07-08 NOTE — Progress Notes (Signed)
I have collaborated with the care management provider regarding care management and care coordination activities outlined in this encounter and have reviewed this encounter including documentation in the note and care plan. I am certifying that I agree with the content of this note and encounter as supervising physician.  

## 2020-07-16 ENCOUNTER — Ambulatory Visit (INDEPENDENT_AMBULATORY_CARE_PROVIDER_SITE_OTHER): Payer: Medicare HMO | Admitting: Orthotics

## 2020-07-16 ENCOUNTER — Other Ambulatory Visit: Payer: Self-pay

## 2020-07-16 DIAGNOSIS — M2041 Other hammer toe(s) (acquired), right foot: Secondary | ICD-10-CM | POA: Diagnosis not present

## 2020-07-16 DIAGNOSIS — E1140 Type 2 diabetes mellitus with diabetic neuropathy, unspecified: Secondary | ICD-10-CM | POA: Diagnosis not present

## 2020-07-16 DIAGNOSIS — M2012 Hallux valgus (acquired), left foot: Secondary | ICD-10-CM | POA: Diagnosis not present

## 2020-07-16 DIAGNOSIS — M2011 Hallux valgus (acquired), right foot: Secondary | ICD-10-CM | POA: Diagnosis not present

## 2020-07-16 DIAGNOSIS — M2042 Other hammer toe(s) (acquired), left foot: Secondary | ICD-10-CM | POA: Diagnosis not present

## 2020-07-16 DIAGNOSIS — E1142 Type 2 diabetes mellitus with diabetic polyneuropathy: Secondary | ICD-10-CM

## 2020-07-21 DIAGNOSIS — R69 Illness, unspecified: Secondary | ICD-10-CM | POA: Diagnosis not present

## 2020-07-21 DIAGNOSIS — E119 Type 2 diabetes mellitus without complications: Secondary | ICD-10-CM | POA: Diagnosis not present

## 2020-07-21 LAB — HM DIABETES EYE EXAM

## 2020-07-26 ENCOUNTER — Ambulatory Visit: Payer: Medicare HMO | Admitting: Family Medicine

## 2020-07-27 ENCOUNTER — Encounter: Payer: Self-pay | Admitting: Family Medicine

## 2020-07-27 ENCOUNTER — Other Ambulatory Visit: Payer: Self-pay

## 2020-07-27 ENCOUNTER — Telehealth: Payer: Self-pay

## 2020-07-27 ENCOUNTER — Ambulatory Visit (INDEPENDENT_AMBULATORY_CARE_PROVIDER_SITE_OTHER): Payer: Medicare HMO | Admitting: Family Medicine

## 2020-07-27 VITALS — BP 140/86 | HR 88 | Temp 97.5°F | Ht 72.0 in | Wt 221.3 lb

## 2020-07-27 DIAGNOSIS — I2510 Atherosclerotic heart disease of native coronary artery without angina pectoris: Secondary | ICD-10-CM

## 2020-07-27 DIAGNOSIS — E113299 Type 2 diabetes mellitus with mild nonproliferative diabetic retinopathy without macular edema, unspecified eye: Secondary | ICD-10-CM

## 2020-07-27 DIAGNOSIS — J449 Chronic obstructive pulmonary disease, unspecified: Secondary | ICD-10-CM | POA: Diagnosis not present

## 2020-07-27 DIAGNOSIS — I10 Essential (primary) hypertension: Secondary | ICD-10-CM

## 2020-07-27 DIAGNOSIS — I700 Atherosclerosis of aorta: Secondary | ICD-10-CM

## 2020-07-27 DIAGNOSIS — Z794 Long term (current) use of insulin: Secondary | ICD-10-CM | POA: Diagnosis not present

## 2020-07-27 LAB — POCT GLYCOSYLATED HEMOGLOBIN (HGB A1C): Hemoglobin A1C: 7.5 % — AB (ref 4.0–5.6)

## 2020-07-27 NOTE — Patient Instructions (Addendum)
Call Dr Arman Filter office to schedule follow up as you're long overdue - you last saw her 08/2019.  You need to avoid situations where your sugars can drop - always remember to eat before going to work outside.  Return in 3 months for follow up visit.  EKG today.    The 15-15 rule for low sugars: If sugar reading below 70, have 15 grams of carbohydrate to raise your blood sugar and check it after 15 minutes. If it's still below 70 mg/dL, have another serving. 15 grams of carbs may be: -Glucose tablets (see instructions) -Gel tube (see instructions) -4 ounces (1/2 cup) of juice or regular soda (not diet) -1 tablespoon of sugar, honey, or corn syrup -Hard candies, jellybeans or gumdrops--see food label for how many to consume  Repeat these steps until your blood sugar is at least 70 mg/dL. Once your blood sugar is back to normal, eat a meal or snack to make sure it doesn't lower again.

## 2020-07-27 NOTE — Telephone Encounter (Signed)
Per Sharyn Lull, clinic pharmacist and PCP, educate pt to try a dexcom cont glucose monitor.   Pt was in today for OV and PCP asked for this nurse to talk to pt concerning the dexcom.  Discussed with pt that he would need to download the dexcom app on his phone first and then return call to clinic to get scheduled for education. Pt is going to talk to his son to see if he can help with the download. Advised if anything is needed to contact office. Pt verbalized understanding.

## 2020-07-27 NOTE — Progress Notes (Signed)
Patient ID: Anthony [name], male    DOB: 10/29/1945, 74 y.o.   MRN: [id number]  This visit was conducted in person.  BP 140/86 (BP Location: Left Arm, Patient Position: Sitting, Cuff Size: Large)   Pulse 88   Temp (!) 97.5 F (36.4 C) (Temporal)   Ht 6' (1.829 m)   Wt 221 lb 5 oz (100.4 kg)   SpO2 96%   BMI 30.02 kg/m    CC: 6 mo f/u visit  Subjective:   HPI: Anthony [name] is a 74 y.o. male presenting on 07/27/2020 for Follow-up (Here for 6 mo f/u.)   Struggling with his own and wife's memory. He has been preparing his own meals.   DM - does regularly check sugars before every meal. Did not bring sugar log. Fasting sugars ~120-130. Compliant with antihyperglycemic regimen which includes: novolog 10-14u 15 min before meals, tresiba 36u daily (self increased from 32u). Takes metformin XR 500mg  once daily. Higher dose caused diarrhea. Low sugars when he forgets to eat and works outdoors down to 40, but not recently. Has glucagon tablets for when this happens. H/o brittle diabetes. Denies paresthesias. Last diabetic eye exam: 06/2020 - found cataracts that will need to be removed. Pneumovax: [id number]. Prevnar: 2014. Glucometer brand: onetouch. DSME: completed remotely. Has still not seen Endo - largely ovedue. Declines endo referral at this time - states he will cal to schedule appt.  Lab Results  Component Value Date   HGBA1C 7.5 (A) 07/27/2020   Diabetic Foot Exam - Simple   Simple Foot Form Diabetic Foot exam was performed with the following findings: Yes 07/27/2020  4:08 PM  Visual Inspection No deformities, no ulcerations, no other skin breakdown bilaterally: Yes Sensation Testing Intact to touch and monofilament testing bilaterally: Yes Pulse Check Posterior Tibialis and Dorsalis pulse intact bilaterally: Yes Comments 1+ DP bilaterally    Lab Results  Component Value Date   MICROALBUR 6.3 (H) 06/28/2015    Lung cancer screening CT showed COPD changes as  well as aortic ATH and CAD ATH. Denies chest pain, tightness, dyspnea.      Relevant past medical, surgical, family and social history reviewed and updated as indicated. Interim medical history since our last visit reviewed. Allergies and medications reviewed and updated. Outpatient Medications Prior to Visit  Medication Sig Dispense Refill  . amLODipine (NORVASC) 5 MG tablet Take 1 tablet (5 mg total) by mouth daily. 90 tablet 3  . b complex vitamins tablet Take 1 tablet by mouth daily.    [name] Kitchen Dextrose, Diabetic Use, (GLUCOSE PO) Take 1 tablet by mouth as needed.    [name] Kitchen glucagon (GLUCAGON EMERGENCY) 1 MG injection Inject 1 mg into the muscle once as needed for up to 1 dose. 1 each 12  . insulin aspart (NOVOLOG FLEXPEN) 100 UNIT/ML FlexPen Inject 10-18 Units into the skin 3 (three) times daily with meals. (Patient taking differently: Inject 10-14 Units into the skin 3 (three) times daily with meals. ) 48.6 mL 1  . Insulin Pen Needle (ADVOCATE INSULIN PEN NEEDLES) 31G X 5 MM MISC Use 3x a day 300 each 3  . metFORMIN (GLUCOPHAGE-XR) 500 MG 24 hr tablet Take 1 tablet (500 mg total) by mouth daily with breakfast.    . olmesartan-hydrochlorothiazide (BENICAR HCT) 40-25 MG tablet Take 1 tablet by mouth daily. 90 tablet 3  . ONETOUCH ULTRA test strip USE AS INSTRUCTED 4X A DAY. DAY SUPPLY PER INSURANCE. 300 strip 1  . pantoprazole (PROTONIX) 40  MG tablet Take 1 tablet (40 mg total) by mouth daily. 90 tablet 3  . sildenafil (VIAGRA) 100 MG tablet TAKE 0.5-1 TABLETS (50-100 MG TOTAL) BY MOUTH DAILY AS NEEDED FOR ERECTILE DYSFUNCTION. 10 tablet 3  . simvastatin (ZOCOR) 20 MG tablet TAKE 1 TABLET BY MOUTH EVERYDAY AT BEDTIME (Patient taking differently: Take 20 mg by mouth at bedtime. TAKE 1 TABLET BY MOUTH EVERYDAY AT BEDTIME) 90 tablet 3  . traMADol (ULTRAM) 50 MG tablet Take 1 tablet (50 mg total) by mouth every 6 (six) hours as needed for moderate pain. 25 tablet 0  . TRESIBA FLEXTOUCH 200 UNIT/ML FlexTouch  Pen INJECT 32 UNITS INTO THE SKIN DAILY. (Patient taking differently: Inject 36 Units into the skin every evening. ) 9 pen 2  . metFORMIN (GLUCOPHAGE-XR) 500 MG 24 hr tablet Take 2 tablets (1,000 mg total) by mouth daily. TAKE 2 TABLETS BY MOUTH TWICE DAILY WITH MEALS (Patient taking differently: Take 1,000 mg by mouth daily. Takes 1 tablet once daily) 360 tablet 3  . traMADol (ULTRAM) 50 MG tablet Take 50 mg by mouth every 6 (six) hours as needed for severe pain.      No facility-administered medications prior to visit.     Per HPI unless specifically indicated in ROS section below Review of Systems Objective:  BP 140/86 (BP Location: Left Arm, Patient Position: Sitting, Cuff Size: Large)   Pulse 88   Temp (!) 97.5 F (36.4 C) (Temporal)   Ht 6' (1.829 m)   Wt 221 lb 5 oz (100.4 kg)   SpO2 96%   BMI 30.02 kg/m   Wt Readings from Last 3 Encounters:  07/27/20 221 lb 5 oz (100.4 kg)  03/04/20 214 lb 6.4 oz (97.3 kg)  03/02/20 214 lb 6.4 oz (97.3 kg)      Physical Exam Vitals and nursing note reviewed.  Constitutional:      General: He is not in acute distress.    Appearance: Normal appearance. He is well-developed. He is not ill-appearing.  HENT:     Head: Normocephalic and atraumatic.  Eyes:     General: No scleral icterus.    Extraocular Movements: Extraocular movements intact.     Conjunctiva/sclera: Conjunctivae normal.     Pupils: Pupils are equal, round, and reactive to light.  Cardiovascular:     Rate and Rhythm: Normal rate and regular rhythm.     Pulses: Normal pulses.     Heart sounds: Normal heart sounds. No murmur heard.   Pulmonary:     Effort: Pulmonary effort is normal. No respiratory distress.     Breath sounds: Normal breath sounds. No wheezing, rhonchi or rales.  Musculoskeletal:     Cervical back: Normal range of motion and neck supple.     Right lower leg: No edema.     Left lower leg: No edema.     Comments: See HPI for foot exam if done    Lymphadenopathy:     Cervical: No cervical adenopathy.  Skin:    General: Skin is warm and dry.     Findings: No rash.  Neurological:     Mental Status: He is alert.  Psychiatric:        Mood and Affect: Mood normal.        Behavior: Behavior normal.       Results for orders placed or performed in visit on 07/27/20  POCT glycosylated hemoglobin (Hb A1C)  Result Value Ref Range   Hemoglobin A1C 7.5 (A) 4.0 -  5.6 %   HbA1c POC (<> result, manual entry)     HbA1c, POC (prediabetic range)     HbA1c, POC (controlled diabetic range)     Lab Results  Component Value Date   CHOL 119 01/12/2020   HDL 37.70 (L) 01/12/2020   LDLCALC 58 01/12/2020   LDLDIRECT 94.0 10/05/2016   TRIG 116.0 01/12/2020   CHOLHDL 3 01/12/2020    EKG - NSR rate 75, LAD, normal intervals, no acute ST/T changes, poor R wave progression.  Assessment & Plan:  This visit occurred during the SARS-CoV-2 public health emergency.  Safety protocols were in place, including screening questions prior to the visit, additional usage of staff PPE, and extensive cleaning of exam room while observing appropriate contact time as indicated for disinfecting solutions.   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 Reviewed importance of regular meals with insulin use. He has been self tapering meds and doesn't remember dose of novolog or bring sugar log. Overdue for endo f/u - states he will call Dr Cruzita Lederer to schedule this. He will benefit from CGM to help prevent hypoglycemia. Previously freestyle Elenor Legato was unaffordable. Will get pt set up for DexCom sample through our office - he will return for teaching with RN.  He is only on metformin 500mg  XL one daily due to diarrhea with higher dose.      Relevant  Medications   metFORMIN (GLUCOPHAGE-XR) 500 MG 24 hr tablet   Other Relevant Orders   POCT glycosylated hemoglobin (Hb A1C) (Completed)   EKG 12-Lead (Completed)   Coronary artery calcification seen on CAT scan    Noted on recent lung cancer screening CT.  Denies anginal symptoms. With h/o brittle diabetes, will benefit from further risk stratification. Check EKG and refer to cardiology.       Relevant Orders   EKG 12-Lead (Completed)   COPD (chronic obstructive pulmonary disease) ([place])    Noted on CT, ex-smoker, denies respiratory symptoms at this time.       Aortic atherosclerosis ([name])    Continue statin.           No orders of the defined types were placed in this encounter.  Orders Placed This Encounter  Procedures  . POCT glycosylated hemoglobin (Hb A1C)  . EKG 12-Lead    Patient Instructions  Call Dr Arman Filter office to schedule follow up as you're long overdue - you last saw her 08/2019.  You need to avoid situations where your sugars can drop - always remember to eat before going to work outside.  Return in 3 months for follow up visit.  EKG today.    The 15-15 rule for low sugars: If sugar reading below 70, have 15 grams of carbohydrate to raise your blood sugar and check it after 15 minutes. If it's still below 70 mg/dL, have another serving. 15 grams of carbs may be: -Glucose tablets (see instructions) -Gel tube (see instructions) -4 ounces (1/2 cup) of juice or regular soda (not diet) -1 tablespoon of sugar, honey, or corn syrup -Hard candies, jellybeans or gumdrops--see food label for how many to consume  Repeat these steps until your blood sugar is at least 70 mg/dL. Once your blood sugar is back to normal, eat a meal or snack to make sure it doesn't lower again.  Follow up plan: Return in about 3 months (around 10/25/2020), or if symptoms worsen or fail to improve.  [name], MD

## 2020-07-28 NOTE — Assessment & Plan Note (Addendum)
Noted on recent lung cancer screening CT.  Denies anginal symptoms. With h/o brittle diabetes, will benefit from further risk stratification. Check EKG and refer to cardiology.

## 2020-07-28 NOTE — Assessment & Plan Note (Signed)
Noted on CT, ex-smoker, denies respiratory symptoms at this time.

## 2020-07-28 NOTE — Assessment & Plan Note (Signed)
Continue statin. 

## 2020-07-28 NOTE — Assessment & Plan Note (Addendum)
Chronic, persistent brittle diabetes with ongoing concern for hypoglycemia although pt states more recently less episodes. Reviewed importance of regular meals with insulin use. He has been self tapering meds and doesn't remember dose of novolog or bring sugar log. Overdue for endo f/u - states he will call Dr Cruzita Lederer to schedule this. He will benefit from CGM to help prevent hypoglycemia. Previously freestyle Elenor Legato was unaffordable. Will get pt set up for DexCom sample through our office - he will return for teaching with RN.  He is only on metformin 500mg  XL one daily due to diarrhea with higher dose.

## 2020-07-29 ENCOUNTER — Encounter: Payer: Self-pay | Admitting: Family Medicine

## 2020-08-05 ENCOUNTER — Telehealth: Payer: Self-pay

## 2020-08-05 NOTE — Chronic Care Management (AMB) (Signed)
Chronic Care Management Pharmacy Assistant   Name: Anthony [name]  MRN: [id number] DOB: 04/25/1946  Reason for Encounter: Disease State  Patient Questions:  1.  Have you seen any other providers since your last visit? Yes 07/27/20 Dr. Danise Mina - PCP-   2.  Any changes in your medicines or health? No     PCP : [name], MD  Allergies:  No Known Allergies  Medications: Outpatient Encounter Medications as of 08/05/2020  Medication Sig  . amLODipine (NORVASC) 5 MG tablet Take 1 tablet (5 mg total) by mouth daily.  [name] Kitchen b complex vitamins tablet Take 1 tablet by mouth daily.  [name] Kitchen Dextrose, Diabetic Use, (GLUCOSE PO) Take 1 tablet by mouth as needed.  [name] Kitchen glucagon (GLUCAGON EMERGENCY) 1 MG injection Inject 1 mg into the muscle once as needed for up to 1 dose.  . insulin aspart (NOVOLOG FLEXPEN) 100 UNIT/ML FlexPen Inject 10-18 Units into the skin 3 (three) times daily with meals. (Patient taking differently: Inject 10-14 Units into the skin 3 (three) times daily with meals. )  . Insulin Pen Needle (ADVOCATE INSULIN PEN NEEDLES) 31G X 5 MM MISC Use 3x a day  . metFORMIN (GLUCOPHAGE-XR) 500 MG 24 hr tablet Take 1 tablet (500 mg total) by mouth daily with breakfast.  . olmesartan-hydrochlorothiazide (BENICAR HCT) 40-25 MG tablet Take 1 tablet by mouth daily.  Glory Rosebush ULTRA test strip USE AS INSTRUCTED 4X A DAY. DAY SUPPLY PER INSURANCE.  . pantoprazole (PROTONIX) 40 MG tablet Take 1 tablet (40 mg total) by mouth daily.  . sildenafil (VIAGRA) 100 MG tablet TAKE 0.5-1 TABLETS (50-100 MG TOTAL) BY MOUTH DAILY AS NEEDED FOR ERECTILE DYSFUNCTION.  . simvastatin (ZOCOR) 20 MG tablet TAKE 1 TABLET BY MOUTH EVERYDAY AT BEDTIME (Patient taking differently: Take 20 mg by mouth at bedtime. TAKE 1 TABLET BY MOUTH EVERYDAY AT BEDTIME)  . traMADol (ULTRAM) 50 MG tablet Take 1 tablet (50 mg total) by mouth every 6 (six) hours as needed for moderate pain.  . TRESIBA FLEXTOUCH 200 UNIT/ML  FlexTouch Pen INJECT 32 UNITS INTO THE SKIN DAILY. (Patient taking differently: Inject 36 Units into the skin every evening. )   No facility-administered encounter medications on file as of 08/05/2020.    Current Diagnosis: Patient Active Problem List   Diagnosis Date Noted  . COPD (chronic obstructive pulmonary disease) ([place]) 02/28/2020  . Coronary artery calcification seen on CAT scan 02/28/2020  . Aortic atherosclerosis ([place]) 02/28/2020  . Ex-smoker 01/21/2020  . RLQ abdominal pain 01/21/2020  . Numbness and tingling in left arm 01/21/2020  . Health maintenance examination 01/05/2017  . Right knee pain 01/18/2016  . Barrett's esophagus 12/08/2015  . Abnormal tympanic membrane of right ear 07/05/2015  . Advanced care planning/counseling discussion 06/30/2014  . Medicare annual wellness visit, subsequent 06/22/2013  . Benign prostatic hyperplasia with urinary obstruction 06/18/2013  . ED (erectile dysfunction) 04/10/2012  . Hyperlipidemia associated with type 2 diabetes mellitus ([place]) 12/29/2008  . LEG PAIN, BILATERAL 11/19/2008  . 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 ([place]) 07/24/2007  . ANXIETY 07/24/2007  . Essential hypertension 07/24/2007  . 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 I let him know I would call him back Monday 08/09/20  . Current  antihyperglycemic regimen:  o Metformin 500 mg XL 1 daily  Insulin aspart (NovoLog) - 10-14 units 5-10 minutes before meals   Tresiba - 36 units daily  . What recent interventions/DTPs have been made to improve glycemic control:  o Patient notes he has not made any changes  . Have there been any recent hospitalizations or ED visits since last visit with CPP? No   . Patient denies hypoglycemic symptoms, including Pale, Sweaty, Shaky, Hungry, Nervous/irritable and Vision changes   . Patient reports hyperglycemic symptoms, including blurry vision   . How often are you checking your blood sugar? 3-4 times daily   . What are your blood sugars ranging?  o Fasting: 08/01/20 258, 08/02/20- 197, 08/03/20 100, 08/04/20- 194, 08/05/20 169, 08/06/20 - 169, 08/07/20- 179, 08/08/20- 114  o Before meals: n/a o After meals: 08/01/20 403, 08/02/20- 173, 08/03/20 79, 08/04/20- 145, 08/05/20 276, 08/06/20 - 500, 08/07/20- 463, 08/08/20- 216 o Bedtime: 08/01/20 247, 08/03/20 150, 08/04/20- 227, 08/06/20 - 352, 08/07/20- 321, 08/08/20- 224  . During the week, how often does your blood glucose drop below 70? Never states 8 has been the lowest reading lately.   Are you checking your feet daily/regularly?  Yes, states he also sees a podiatrist.  Adherence Review: Is the patient currently on a STATIN medication? Yes Is the patient currently on ACE/ARB medication? Yes Does the patient have >5 day gap between last estimated fill dates? No      Follow-Up:  Pharmacist Review   Debbora Dus, CPP notified  [name], [place] Pharmacy Assistant [phone]

## 2020-08-11 ENCOUNTER — Other Ambulatory Visit: Payer: Self-pay | Admitting: Internal Medicine

## 2020-08-11 ENCOUNTER — Telehealth: Payer: Self-pay | Admitting: Family Medicine

## 2020-08-11 NOTE — Telephone Encounter (Signed)
Patient was last seen in 08/2019 but seems to be under the care of another provider for Diabetes. Ok to refuse?

## 2020-08-11 NOTE — Telephone Encounter (Signed)
Pt called in wanted to let Dr.G know that is going to see his endocrinologist tomorrow at 2pm.

## 2020-08-11 NOTE — Telephone Encounter (Signed)
OK to refill for now, but needs a new appt with me within the next 3 mo. He only saw PCP from what I see in the chart.

## 2020-08-11 NOTE — Telephone Encounter (Signed)
Noted! Thank you

## 2020-08-12 ENCOUNTER — Ambulatory Visit (INDEPENDENT_AMBULATORY_CARE_PROVIDER_SITE_OTHER): Payer: Medicare HMO | Admitting: Internal Medicine

## 2020-08-12 ENCOUNTER — Encounter: Payer: Self-pay | Admitting: Internal Medicine

## 2020-08-12 ENCOUNTER — Other Ambulatory Visit: Payer: Self-pay

## 2020-08-12 VITALS — BP 130/82 | HR 89 | Ht 72.0 in | Wt 218.2 lb

## 2020-08-12 DIAGNOSIS — E663 Overweight: Secondary | ICD-10-CM

## 2020-08-12 DIAGNOSIS — E785 Hyperlipidemia, unspecified: Secondary | ICD-10-CM

## 2020-08-12 DIAGNOSIS — Z794 Long term (current) use of insulin: Secondary | ICD-10-CM

## 2020-08-12 DIAGNOSIS — E113299 Type 2 diabetes mellitus with mild nonproliferative diabetic retinopathy without macular edema, unspecified eye: Secondary | ICD-10-CM | POA: Diagnosis not present
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
## 2020-08-12 NOTE — Patient Instructions (Addendum)
Please continue: - Metformin ER 1000 mg daily - Tresiba 32 units daily   PLEASE MOVE THE NOVOLOG 15 MIN BEFORE EACH MEAL!!!!  Continue: - Novolog  - 12 units before small meals - 16 units before regular meals - 18(-20) units before larger meals   Please return in 3-4 months with your sugar log.

## 2020-08-13 NOTE — Telephone Encounter (Signed)
It looks like his next appointment is not until 12/16/2020. Is that ok or do you want me to get him scheduled sooner?

## 2020-08-20 DIAGNOSIS — R69 Illness, unspecified: Secondary | ICD-10-CM | POA: Diagnosis not present

## 2020-08-25 ENCOUNTER — Other Ambulatory Visit: Payer: Self-pay

## 2020-08-25 ENCOUNTER — Ambulatory Visit: Payer: Medicare HMO

## 2020-08-25 DIAGNOSIS — E1169 Type 2 diabetes mellitus with other specified complication: Secondary | ICD-10-CM

## 2020-08-25 DIAGNOSIS — E113299 Type 2 diabetes mellitus with mild nonproliferative diabetic retinopathy without macular edema, unspecified eye: Secondary | ICD-10-CM

## 2020-08-25 DIAGNOSIS — E785 Hyperlipidemia, unspecified: Secondary | ICD-10-CM

## 2020-08-25 NOTE — Chronic Care Management (AMB) (Signed)
Chronic Care Management Pharmacy  Name: Anthony [name]  MRN: [id number] DOB: March 24, 1946  Chief Complaint/ HPI  Anthony [name],  74 y.o. , male presents for their Follow-Up CCM visit with the clinical pharmacist via telephone. Patient reports he had a good Christmas, saw his whole family.   PCP : Anthony Bush, MD  Their chronic conditions include: HTN, COPD, GERD, DM, HLD, BPH, anxiety, ED, leg pain  Office Visits:  07/27/20: Anthony [name] called pt to discuss dexcom education, asked pt to download app and call to schedule appt with nurse for education  07/27/20: PCP/Anthony [name] - brittle diabetes, schedule follow up with endo, Dexcom sample, self-tapering meds, unclear about Novolog dose   01/20/20: PCP/Anthony [name] - suspect right inguinal hernia; numbness in left arm - suggest vitamin B12 trial for 1 month; LDL at goal on simvastatin; GERD on PPI; referral for cancer screening; HTN stable; DM uncontrolled. Encouraged endo f/u; BPH stable, chronic, s/p TURP   Consult Visit:   08/12/20: Endocrinology - take Novolog 15 min before meals, keep log of when BG is high (missed doses, etc), continue Tresiba 32 units daily, metformin ER 500 mg daily, Novolog sliding scale - 12 units small meals, 16 regular meals, 18 large meals   06/02/20: Podiatry - annual DM foot exam   02/25/20: Pulmonology - lung cancer screening program  No Known Allergies   Medications: Outpatient Encounter Medications as of 08/25/2020  Medication Sig   amLODipine (NORVASC) 5 MG tablet Take 1 tablet (5 mg total) by mouth daily.   b complex vitamins tablet Take 1 tablet by mouth daily.   Dextrose, Diabetic Use, (GLUCOSE PO) Take 1 tablet by mouth as needed.   glucagon (GLUCAGON EMERGENCY) 1 MG injection Inject 1 mg into the muscle once as needed for up to 1 dose.   insulin aspart (NOVOLOG FLEXPEN) 100 UNIT/ML FlexPen Inject 10-18 Units into the skin 3 (three) times daily with meals. (Patient taking  differently: Inject 12-14 Units into the skin 3 (three) times daily with meals.)   Insulin Pen Needle (ADVOCATE INSULIN PEN NEEDLES) 31G X 5 MM MISC Use 3x a day   metFORMIN (GLUCOPHAGE-XR) 500 MG 24 hr tablet TAKE 2 TABLETS (1,000 MG TOTAL) BY MOUTH DAILY. TAKE 2 TABLETS BY MOUTH TWICE DAILY WITH MEALS   olmesartan-hydrochlorothiazide (BENICAR HCT) 40-25 MG tablet Take 1 tablet by mouth daily.   ONETOUCH ULTRA test strip USE AS INSTRUCTED 4X A DAY. DAY SUPPLY PER INSURANCE.   pantoprazole (PROTONIX) 40 MG tablet Take 1 tablet (40 mg total) by mouth daily.   sildenafil (VIAGRA) 100 MG tablet TAKE 0.5-1 TABLETS (50-100 MG TOTAL) BY MOUTH DAILY AS NEEDED FOR ERECTILE DYSFUNCTION.   simvastatin (ZOCOR) 20 MG tablet TAKE 1 TABLET BY MOUTH EVERYDAY AT BEDTIME (Patient taking differently: Take 20 mg by mouth at bedtime. TAKE 1 TABLET BY MOUTH EVERYDAY AT BEDTIME)   traMADol (ULTRAM) 50 MG tablet Take 1 tablet (50 mg total) by mouth every 6 (six) hours as needed for moderate pain.   TRESIBA FLEXTOUCH 200 UNIT/ML FlexTouch Pen INJECT 32 UNITS INTO THE SKIN DAILY. (Patient taking differently: Inject 36 Units into the skin every evening.)   No facility-administered encounter medications on file as of 08/25/2020.    Current Diagnosis/Assessment  SDOH Interventions   Flowsheet Row Most Recent Value  SDOH Interventions   Financial Strain Interventions Intervention Not Indicated      Goals     Patient Stated     Starting 01/09/2019, I will  to taking medication as prescribed.  °  ° • Patient Stated   °  01/12/2020, I will maintain and continue medications as prescribed. °  ° • Pharmacy Care Plan   °  CARE PLAN ENTRY °(see longitudinal plan of care for additional care plan information) ° °Current Barriers:  °• Chronic Disease Management support, education, and care coordination needs related to Hyperlipidemia and Diabetes °  °Hyperlipidemia °Lab Results  °Component Value Date/Time  °  LDLCALC 58 01/12/2020 10:21 AM  ° LDLDIRECT 94.0 10/05/2016 05:28 PM  ° °• Pharmacist Clinical Goal(s): °o Over the next 4 weeks, patient will work with PharmD and providers to maintain LDL goal < 100 °• Current regimen:  °o Simvastatin 20 mg - 1 tablet daily  °• Interventions: °o Reviewed adherence, refills timely °• Patient self care activities - Over the next 4 weeks, patient will: °o Continue medication as prescribed °• Slowly increase exercise with goal of 30 minutes, 5 days per week °• Incorporate a healthy diet high in vegetables, fruits and whole grains with low-fat dairy products, chicken, fish, legumes, non-tropical vegetable oils and nuts. Limit intake of sweets, sugar-sweetened beverages and red meats. ° °Diabetes °Lab Results  °Component Value Date/Time  ° HGBA1C 7.5 (A) 07/27/2020 03:13 PM  ° HGBA1C 7.7 (H) 03/02/2020 10:26 AM  ° HGBA1C 7.8 (H) 01/12/2020 10:21 AM •  °Pharmacist Clinical Goal(s): °o Over the next 4 weeks, patient will work with PharmD and providers to achieve A1c goal <7% and prevent hypoglycemia  °• Current regimen:  °· Metformin 500 mg - 1 tablet daily °· Insulin aspart (NovoLog) - 12 units small meals, 16 regular meals, 18 large meal three times daily 5-10 minutes before meals  °· Tresiba - 32 units daily °• Interventions: °o Adjust medication timing to prevent hypoglycemia - take Novolog before meals  °o Consult with Dr. Gherghe to consider Ozempic °o Discussed Dexcom - pt would like to discuss next month  °• Patient self care activities - Over the next 4 weeks, patient will: °o Check blood sugar 3-4 times daily, document, and provide at future appointments °o Write down any low blood glucose readings °o Take Novolog 5-10 minutes before meals °o Try to eat lunch earlier in the day and if unable, bring a snack with you  °o Contact provider with any episodes of hypoglycemia ° °Please see past updates related to this goal by clicking on the "Past Updates" button in the selected goal   °  °  ° °Diabetes  ° °Followed by Dr. Gherghe, last appt 07/2020 °Recent Relevant Labs: °Lab Results  °Component Value Date/Time  ° HGBA1C 7.5 (A) 07/27/2020 03:13 PM  ° HGBA1C 7.7 (H) 03/02/2020 10:26 AM  ° HGBA1C 7.8 (H) 01/12/2020 10:21 AM  ° MICROALBUR 6.3 (H) 06/28/2015 09:39 AM  ° MICROALBUR 4.4 (H) 06/26/2014 09:05 AM  °  °Patient has failed these meds in past: Lantus °Patient is currently uncontrolled on the following medications:  °· Metformin 500 mg - 1 tablet daily (max tolerated dose) °· Insulin aspart - 12 units small meals, 16 regular meals, 18 large meal TID 5-10 minutes before meals  °· Tresiba - 32 units daily at bedtime ° °At last visit we discussed his history of hypoglycemia this year. Trial CGM sample, Dexcom. Take Novolog before meals instead of afterward. Carry snack with him to work. Apply for Tresiba assistance through manufacturer. ° °Update 08/25/20 - Pt has not gotten the Dexcom sample yet. Does not know how to   to download apps on iphone. He has spoke to his son about it and watched the [facility] video. I think he may benefit from assistance downloading the app in office. Offered appt but pt has a lot going on right now with credit card scam. He would like me to call back to check in at the end of January.   BG log: ? Fasting: 08/01/20 258, 08/02/20- 197, 08/03/20 100, 08/04/20- 194, 08/05/20 169, 08/06/20 - 169, 08/07/20- 179, 08/08/20- 114  ? Before meals: n/a ? After meals: 08/01/20 403, 08/02/20- 173, 08/03/20 79, 08/04/20- 145, 08/05/20 276, 08/06/20 - 500, 08/07/20- 463, 08/08/20- 216 ? Bedtime: 08/01/20 247, 08/03/20 150, 08/04/20- 227, 08/06/20 - 352, 08/07/20- 321, 08/08/20- 224  Adherence - Reviewed taking Novolog before meals instead of after. He reports its going better but still difficult with eating out. He is only taking Novolog 10-12 units before meals due to concern about hypoglycemia. Encouraged increasing this per endo note. Reviewed endo note instructions for 12 units small  meal, 16 regular meal, 18 large meal. Diet -  Reports trying to cut back on Zaxbys.   Hypoglycemia - Denies any BG < 70 in the past month. Per PCP note, pt has been self weaning his insulin. Today pt expressed much concern about fear of hypoglycemia. He reports he does much better with high BG. I think he would be a good candidate for GLP-1 considering his hypoglycemia. Cost: Pt is reports income is above limits for [employer] assistance.  BG monitoring: Pt reports overwhelmed by instruction to check BG 5 times per day, but also not ready for Dexcom at this time.  Plan: Continue current medications; Consult Dr. Cruzita Lederer - Request Dexcom rx to [place]  to check price. Recommend Ozempic considering frequent hypoglycemia, lack of adherence. Check back in at the end of January to see if pt is ready for Dexcom sample.  Hyperlipidemia   LDL goal < 100  Last lipids Lab Results  Component Value Date   CHOL 119 01/12/2020   HDL 37.70 (L) 01/12/2020   LDLCALC 58 01/12/2020   LDLDIRECT 94.0 10/05/2016   TRIG 116.0 01/12/2020   CHOLHDL 3 01/12/2020   Hepatic Function Latest Ref Rng & Units 01/12/2020 01/13/2019 01/01/2018  Total Protein 6.0 - 8.3 g/dL 6.0 5.9(L) 6.0  Albumin 3.5 - 5.2 g/dL 4.1 3.9 4.1  AST 0 - 37 U/L _0 ALT 0 - 53 U/L _1 Alk Phosphatase 39 - 117 U/L 93 78 68  Total Bilirubin 0.2 - 1.2 mg/dL 0.8 0.7 0.8  Bilirubin, Direct 0.0 - 0.3 mg/dL - - -    The ASCVD Risk score ([place]., et al., 2013) failed to calculate for the following reasons:   The valid total cholesterol range is 130 to 320 mg/dL   Patient has failed these meds in past: none Patient is currently controlled on the following medications:   Simvastatin 20 mg - 1 tablet daily   We discussed: <5 day gap on refills   Plan: Continue current medications   ED   Patient has failed these meds in past: Cialis - doesn't recall if it helped  Patient is currently uncontrolled on the  following medications:   Sildenafil 100 mg - 1/2 to 1 tablet PRN  We discussed: Reports some benefit but would like something more effective, also cost is high --> paying $60 for 5 tablets. Would like to switch to Cialis due to longer duration of action/price  Plan: Consult PCP to consider switch to Cialis  ° °Medication Management °· Pharmacy/Benefits: [facility] ° °CCM Follow Up: 1 month telephone (09/20/20 at 1 PM) ° ° , PharmD °Clinical Pharmacist °[facility] [place] °[phone] ° ° °

## 2020-08-26 ENCOUNTER — Telehealth: Payer: Self-pay

## 2020-08-26 NOTE — Telephone Encounter (Signed)
Dr. Elvera Lennox,  Pt is very concerned about hypoglycemia with his Novolog due to frequent episodes over the past year. He is taking a lower dose than prescribed (10-14 units rather than 12-18) and forgets to take before meals.   Wondering your thoughts on Ozempic?   Here is his BG log from 08/01/20-08/08/20: ? Fasting: 08/01/20 258, 08/02/20- 197, 08/03/20 100, 08/04/20- 194, 08/05/20 169, 08/06/20 - 169, 08/07/20- 179, 08/08/20- 114  ? Before meals: n/a ? After meals: 08/01/20 403, 08/02/20- 173, 08/03/20 79, 08/04/20- 145, 08/05/20 276, 08/06/20 - 500, 08/07/20- 463, 08/08/20- 216 ? Bedtime: 08/01/20 247, 08/03/20 150, 08/04/20- 227, 08/06/20 - 352, 08/07/20- 321, 08/08/20- 224  Also - Would you mind sending a prescription for his Dexcom to [facility] so we can check the price for him? He has not come in to get a sample from the PCP office yet. Appreciate your care for this patient.  Thank you,  Phil Dopp, PharmD Clinical Pharmacist [facility] [phone]

## 2020-08-26 NOTE — Patient Instructions (Signed)
Dear Anthony [name],  Below is a summary of the goals we discussed during our follow up appointment on August 26, 2020. Please contact me anytime with questions or concerns.   Visit Information  Goals Addressed            This Visit's Progress    Pharmacy Care Plan       CARE PLAN ENTRY (see longitudinal plan of care for additional care plan information)  Current Barriers:   Chronic Disease Management support, education, and care coordination needs related to Hyperlipidemia and Diabetes   Hyperlipidemia Lab Results  Component Value Date/Time   LDLCALC 58 01/12/2020 10:21 AM   LDLDIRECT 94.0 10/05/2016 05:28 PM    Pharmacist Clinical Goal(s): o Over the next 4 weeks, patient will work with PharmD and providers to maintain LDL goal < 100  Current regimen:  o Simvastatin 20 mg - 1 tablet daily   Interventions: o Reviewed adherence, refills timely  Patient self care activities - Over the next 4 weeks, patient will: o Continue medication as prescribed  Slowly increase exercise with goal of 30 minutes, 5 days per week  Incorporate a healthy diet high in vegetables, fruits and whole grains with low-fat dairy products, chicken, fish, legumes, non-tropical vegetable oils and nuts. Limit intake of sweets, sugar-sweetened beverages and red meats.  Diabetes Lab Results  Component Value Date/Time   HGBA1C 7.5 (A) 07/27/2020 03:13 PM   HGBA1C 7.7 (H) 03/02/2020 10:26 AM   HGBA1C 7.8 (H) 01/12/2020 10:21 AM   Pharmacist Clinical Goal(s): o Over the next 4 weeks, patient will work with PharmD and providers to achieve A1c goal <7% and prevent hypoglycemia   Current regimen:   Metformin 500 mg - 1 tablet daily  Insulin aspart (NovoLog) - 12 units small meals, 16 regular meals, 18 large meal three times daily 5-10 minutes before meals   Tresiba - 32 units daily  Interventions: o Adjust medication timing to prevent hypoglycemia - take Novolog before meals  o Consult  with Dr. Elvera Lennox to consider Ozempic o Discussed Dexcom - pt would like to discuss next month   Patient self care activities - Over the next 4 weeks, patient will: o Check blood sugar 3-4 times daily, document, and provide at future appointments o Write down any low blood glucose readings o Take Novolog 5-10 minutes before meals o Try to eat lunch earlier in the day and if unable, bring a snack with you  o Contact provider with any episodes of hypoglycemia  Please see past updates related to this goal by clicking on the "Past Updates" button in the selected goal        The patient verbalized understanding of instructions, educational materials, and care plan provided today and declined offer to receive copy of patient instructions, educational materials, and care plan.   Telephone follow up appointment with pharmacy team member scheduled for: 09/20/2020 at 1 PM  Phil Dopp, PharmD Clinical Pharmacist [facility] [phone]   Diabetes Basics  Diabetes (diabetes mellitus) is a long-term (chronic) disease. It occurs when the body does not properly use sugar (glucose) that is released from food after you eat. Diabetes may be caused by one or both of these problems:  Your pancreas does not make enough of a hormone called insulin.  Your body does not react in a normal way to insulin that it makes. Insulin lets sugars (glucose) go into cells in your body. This gives you energy. If you  have diabetes, sugars cannot get into cells. This causes high blood sugar (hyperglycemia). Follow these instructions at home: How is diabetes treated? You may need to take insulin or other diabetes medicines daily to keep your blood sugar in balance. Take your diabetes medicines every day as told by your doctor. List your diabetes medicines here: Diabetes medicines  Name of medicine: ______________________________ ? Amount (dose): _______________ Time (a.m./p.m.):  _______________ Notes: ___________________________________  Name of medicine: ______________________________ ? Amount (dose): _______________ Time (a.m./p.m.): _______________ Notes: ___________________________________  Name of medicine: ______________________________ ? Amount (dose): _______________ Time (a.m./p.m.): _______________ Notes: ___________________________________ If you use insulin, you will learn how to give yourself insulin by injection. You may need to adjust the amount based on the food that you eat. List the types of insulin you use here: Insulin  Insulin type: ______________________________ ? Amount (dose): _______________ Time (a.m./p.m.): _______________ Notes: ___________________________________  Insulin type: ______________________________ ? Amount (dose): _______________ Time (a.m./p.m.): _______________ Notes: ___________________________________  Insulin type: ______________________________ ? Amount (dose): _______________ Time (a.m./p.m.): _______________ Notes: ___________________________________  Insulin type: ______________________________ ? Amount (dose): _______________ Time (a.m./p.m.): _______________ Notes: ___________________________________  Insulin type: ______________________________ ? Amount (dose): _______________ Time (a.m./p.m.): _______________ Notes: ___________________________________ How do I manage my blood sugar?  Check your blood sugar levels using a blood glucose monitor as directed by your doctor. Your doctor will set treatment goals for you. Generally, you should have these blood sugar levels:  Before meals (preprandial): 80-130 mg/dL (5.0-5.3 mmol/L).  After meals (postprandial): below 180 mg/dL (10 mmol/L).  A1c level: less than 7%. Write down the times that you will check your blood sugar levels: Blood sugar checks  Time: _______________ Notes: ___________________________________  Time: _______________ Notes:  ___________________________________  Time: _______________ Notes: ___________________________________  Time: _______________ Notes: ___________________________________  Time: _______________ Notes: ___________________________________  Time: _______________ Notes: ___________________________________  What do I need to know about low blood sugar? Low blood sugar is called hypoglycemia. This is when blood sugar is at or below 70 mg/dL (3.9 mmol/L). Symptoms may include:  Feeling: ? Hungry. ? Worried or nervous (anxious). ? Sweaty and clammy. ? Confused. ? Dizzy. ? Sleepy. ? Sick to your stomach (nauseous).  Having: ? A fast heartbeat. ? A headache. ? A change in your vision. ? Tingling or no feeling (numbness) around the mouth, lips, or tongue. ? Jerky movements that you cannot control (seizure).  Having trouble with: ? Moving (coordination). ? Sleeping. ? Passing out (fainting). ? Getting upset easily (irritability). Treating low blood sugar To treat low blood sugar, eat or drink something sugary right away. If you can think clearly and swallow safely, follow the 15:15 rule:  Take 15 grams of a fast-acting carb (carbohydrate). Talk with your doctor about how much you should take.  Some fast-acting carbs are: ? Sugar tablets (glucose pills). Take 3-4 glucose pills. ? 6-8 pieces of hard candy. ? 4-6 oz (120-150 mL) of fruit juice. ? 4-6 oz (120-150 mL) of regular (not diet) soda. ? 1 Tbsp (15 mL) honey or sugar.  Check your blood sugar 15 minutes after you take the carb.  If your blood sugar is still at or below 70 mg/dL (3.9 mmol/L), take 15 grams of a carb again.  If your blood sugar does not go above 70 mg/dL (3.9 mmol/L) after 3 tries, get help right away.  After your blood sugar goes back to normal, eat a meal or a snack within 1 hour. Treating very low blood sugar If your blood sugar is at or below 54  mg/dL (3 mmol/L), you have very low blood sugar (severe  hypoglycemia). This is an emergency. Do not wait to see if the symptoms will go away. Get medical help right away. Call your local emergency services (911 in the [place] Do not drive yourself to the hospital. Questions to ask your health care provider  Do I need to meet with a diabetes educator?  What equipment will I need to care for myself at home?  What diabetes medicines do I need? When should I take them?  How often do I need to check my blood sugar?  What number can I call if I have questions?  When is my next doctor's visit?  Where can I find a support group for people with diabetes? Where to find more information  American Diabetes Association: www.diabetes.org  American Association of Diabetes Educators: www.diabeteseducator.org/patient-resources Contact a doctor if:  Your blood sugar is at or above 240 mg/dL (13.3 mmol/L) for 2 days in a row.  You have been sick or have had a fever for 2 days or more, and you are not getting better.  You have any of these problems for more than 6 hours: ? You cannot eat or drink. ? You feel sick to your stomach (nauseous). ? You throw up (vomit). ? You have watery poop (diarrhea). Get help right away if:  Your blood sugar is lower than 54 mg/dL (3 mmol/L).  You get confused.  You have trouble: ? Thinking clearly. ? Breathing. Summary  Diabetes (diabetes mellitus) is a long-term (chronic) disease. It occurs when the body does not properly use sugar (glucose) that is released from food after digestion.  Take insulin and diabetes medicines as told.  Check your blood sugar every day, as often as told.  Keep all follow-up visits as told by your doctor. This is important. This information is not intended to replace advice given to you by your health care provider. Make sure you discuss any questions you have with your health care provider. Document Revised: 05/07/2019 Document Reviewed: 11/16/2017 Elsevier Patient Education   [place].

## 2020-08-26 NOTE — Chronic Care Management (AMB) (Addendum)
Chronic Care Management Pharmacy Assistant   Name: Anthony [name]  MRN: [id number] DOB: 08-Jul-1946  Reason for Encounter: Medication Review- Adherence review  Reviewed chart and adherence measures. Per refill history < 5 day gap on statin. Excluded from DM measures due to insulin.  PCP : [name], MD  Allergies:  No Known Allergies  Medications: Outpatient Encounter Medications as of 08/26/2020  Medication Sig   amLODipine (NORVASC) 5 MG tablet Take 1 tablet (5 mg total) by mouth daily.   b complex vitamins tablet Take 1 tablet by mouth daily.   Dextrose, Diabetic Use, (GLUCOSE PO) Take 1 tablet by mouth as needed.   glucagon (GLUCAGON EMERGENCY) 1 MG injection Inject 1 mg into the muscle once as needed for up to 1 dose.   insulin aspart (NOVOLOG FLEXPEN) 100 UNIT/ML FlexPen Inject 10-18 Units into the skin 3 (three) times daily with meals. (Patient taking differently: Inject 12-14 Units into the skin 3 (three) times daily with meals.)   Insulin Pen Needle (ADVOCATE INSULIN PEN NEEDLES) 31G X 5 MM MISC Use 3x a day   metFORMIN (GLUCOPHAGE-XR) 500 MG 24 hr tablet TAKE 2 TABLETS (1,000 MG TOTAL) BY MOUTH DAILY. TAKE 2 TABLETS BY MOUTH TWICE DAILY WITH MEALS   olmesartan-hydrochlorothiazide (BENICAR HCT) 40-25 MG tablet Take 1 tablet by mouth daily.   ONETOUCH ULTRA test strip USE AS INSTRUCTED 4X A DAY. DAY SUPPLY PER INSURANCE.   pantoprazole (PROTONIX) 40 MG tablet Take 1 tablet (40 mg total) by mouth daily.   sildenafil (VIAGRA) 100 MG tablet TAKE 0.5-1 TABLETS (50-100 MG TOTAL) BY MOUTH DAILY AS NEEDED FOR ERECTILE DYSFUNCTION.   simvastatin (ZOCOR) 20 MG tablet TAKE 1 TABLET BY MOUTH EVERYDAY AT BEDTIME (Patient taking differently: Take 20 mg by mouth at bedtime. TAKE 1 TABLET BY MOUTH EVERYDAY AT BEDTIME)   traMADol (ULTRAM) 50 MG tablet Take 1 tablet (50 mg total) by mouth every 6 (six) hours as needed for moderate pain.   TRESIBA FLEXTOUCH 200 UNIT/ML FlexTouch Pen  INJECT 32 UNITS INTO THE SKIN DAILY. (Patient taking differently: Inject 36 Units into the skin every evening.)   No facility-administered encounter medications on file as of 08/26/2020.    Current Diagnosis: Patient Active Problem List   Diagnosis Date Noted   COPD (chronic obstructive pulmonary disease) ([name]) 02/28/2020   Coronary artery calcification seen on CAT scan 02/28/2020   Aortic atherosclerosis ([name]) 02/28/2020   Ex-smoker 01/21/2020   RLQ abdominal pain 01/21/2020   Numbness and tingling in left arm 01/21/2020   Health maintenance examination 01/05/2017   Right knee pain 01/18/2016   Barrett's esophagus 12/08/2015   Abnormal tympanic membrane of right ear 07/05/2015   Advanced care planning/counseling discussion 06/30/2014   Medicare annual wellness visit, subsequent 06/22/2013   Benign prostatic hyperplasia with urinary obstruction 06/18/2013   ED (erectile dysfunction) 04/10/2012   Hyperlipidemia associated with type 2 diabetes mellitus ([name]) 12/29/2008   LEG PAIN, BILATERAL 11/19/2008   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 ([name]) 07/24/2007   ANXIETY 07/24/2007   Essential hypertension 07/24/2007   GERD 07/24/2007    Follow-Up:  Pharmacist Review   Phil Dopp, CPP notified  Jomarie Longs, [facility] Clinical Pharmacy Assistant [phone]  Reviewed chart and insurance data for medication adherence. Patient does not have any gaps in adherence and is not in danger of failing any Medicare adherence measures. No further action required.  Phil Dopp, PharmD Clinical Pharmacist [facility] at [facility]  [phone]   

## 2020-08-26 NOTE — Telephone Encounter (Signed)
PCP consult regarding CCM 08/25/20:  Pt reports some benefit from Viagra but cost is high. He is wondering if he could trial Cialis again due to longer duration of action and to compare pricing. [facility] [address].   Phil Dopp, PharmD Clinical Pharmacist  Primary Care at [facility] [phone]

## 2020-08-27 MED ORDER — TADALAFIL 20 MG PO TABS: 10.0000 mg | ORAL_TABLET | ORAL | 6 refills | Status: DC | PRN

## 2020-08-27 NOTE — Telephone Encounter (Signed)
I've sent Rx for cialis to pharmacy.

## 2020-08-27 NOTE — Progress Notes (Signed)
I have collaborated with the care management provider regarding care management and care coordination activities outlined in this encounter and have reviewed this encounter including documentation in the note and care plan. I am certifying that I agree with the content of this note and encounter as supervising physician.  

## 2020-08-30 ENCOUNTER — Other Ambulatory Visit: Payer: Self-pay | Admitting: Internal Medicine

## 2020-08-30 MED ORDER — DEXCOM G6 SENSOR MISC: 1.0000 | 3 refills | Status: AC

## 2020-08-30 MED ORDER — DEXCOM G6 RECEIVER DEVI: 1.0000 | Freq: Once | 0 refills | Status: AC

## 2020-08-30 MED ORDER — DEXCOM G6 TRANSMITTER MISC: 1.0000 | 3 refills | Status: DC

## 2020-08-30 NOTE — Telephone Encounter (Signed)
Marcelino Duster, Yes, I sent the Dexcom Rx's to [facility]. Regarding Ozempic, would this be covered for him?  If so, we can definitely try it.  Please let me know where to send it in this case. Thank you! CG

## 2020-08-31 ENCOUNTER — Other Ambulatory Visit: Payer: Self-pay

## 2020-08-31 ENCOUNTER — Telehealth: Payer: Self-pay

## 2020-08-31 ENCOUNTER — Ambulatory Visit: Payer: Medicare HMO | Admitting: Internal Medicine

## 2020-08-31 ENCOUNTER — Encounter: Payer: Self-pay | Admitting: Internal Medicine

## 2020-08-31 VITALS — BP 148/70 | HR 83 | Ht 72.0 in | Wt 219.0 lb

## 2020-08-31 DIAGNOSIS — N529 Male erectile dysfunction, unspecified: Secondary | ICD-10-CM | POA: Diagnosis not present

## 2020-08-31 DIAGNOSIS — E119 Type 2 diabetes mellitus without complications: Secondary | ICD-10-CM | POA: Diagnosis not present

## 2020-08-31 DIAGNOSIS — I2510 Atherosclerotic heart disease of native coronary artery without angina pectoris: Secondary | ICD-10-CM

## 2020-08-31 DIAGNOSIS — I10 Essential (primary) hypertension: Secondary | ICD-10-CM

## 2020-08-31 DIAGNOSIS — I700 Atherosclerosis of aorta: Secondary | ICD-10-CM | POA: Diagnosis not present

## 2020-08-31 DIAGNOSIS — E7849 Other hyperlipidemia: Secondary | ICD-10-CM | POA: Diagnosis not present

## 2020-08-31 NOTE — Patient Instructions (Addendum)
Medication Instructions:  *If you need a refill on your cardiac medications before your next appointment, please call your pharmacy*  Lab Work: Your physician recommends that you have lab work today- Lipid panel  If you have labs (blood work) drawn today and your tests are completely normal, you will receive your results only by: [name] Kitchen MyChart Message (if you have MyChart) OR . A paper copy in the mail If you have any lab test that is abnormal or we need to change your treatment, we will call you to review the results.  Testing/Procedures: None ordered today.  Follow-Up: At [facility], you and your health needs are our priority.  As part of our continuing mission to provide you with exceptional heart care, we have created designated Provider Care Teams.  These Care Teams include your primary Cardiologist (physician) and Advanced Practice Providers (APPs -  Physician Assistants and Nurse Practitioners) who all work together to provide you with the care you need, when you need it.  We recommend signing up for the patient portal called "MyChart".  Sign up information is provided on this After Visit Summary.  MyChart is used to connect with patients for Virtual Visits (Telemedicine).  Patients are able to view lab/test results, encounter notes, upcoming appointments, etc.  Non-urgent messages can be sent to your provider as well.   To learn more about what you can do with MyChart, go to [email or web address].    Your next appointment:   3 month(s)  The format for your next appointment:   In Person  Provider:   You may see Dr. Izora Ribas or one of the following Advanced Practice Providers on your designated Care Team:    Ronie Spies, PA-C  Jacolyn Reedy, PA-C

## 2020-08-31 NOTE — Chronic Care Management (AMB) (Addendum)
Chronic Care Management Pharmacy Assistant   Name: Anthony [name]  MRN: [id number] DOB: 1946-02-09  Reason for Encounter: Medication Review  Patient Questions:  1.  Have you seen any other providers since your last visit? No  2.  Any changes in your medicines or health? No    PCP : [name], MD  Allergies:  No Known Allergies  Medications: Outpatient Encounter Medications as of 08/31/2020  Medication Sig   amLODipine (NORVASC) 5 MG tablet Take 1 tablet (5 mg total) by mouth daily.   b complex vitamins tablet Take 1 tablet by mouth daily.   Continuous Blood Gluc Sensor (DEXCOM G6 SENSOR) MISC Inject 1 Device into the skin continuous for 10 days.   Continuous Blood Gluc Transmit (DEXCOM G6 TRANSMITTER) MISC 1 Device by Does not apply route every 3 (three) months.   Dextrose, Diabetic Use, (GLUCOSE PO) Take 1 tablet by mouth as needed.   glucagon (GLUCAGON EMERGENCY) 1 MG injection Inject 1 mg into the muscle once as needed for up to 1 dose.   insulin aspart (NOVOLOG FLEXPEN) 100 UNIT/ML FlexPen Inject 10-18 Units into the skin 3 (three) times daily with meals. (Patient taking differently: Inject 12-14 Units into the skin 3 (three) times daily with meals.)   Insulin Pen Needle (ADVOCATE INSULIN PEN NEEDLES) 31G X 5 MM MISC Use 3x a day   metFORMIN (GLUCOPHAGE-XR) 500 MG 24 hr tablet TAKE 2 TABLETS (1,000 MG TOTAL) BY MOUTH DAILY. TAKE 2 TABLETS BY MOUTH TWICE DAILY WITH MEALS (Patient taking differently: 500 mg daily with breakfast.)   olmesartan-hydrochlorothiazide (BENICAR HCT) 40-25 MG tablet Take 1 tablet by mouth daily.   ONETOUCH ULTRA test strip USE AS INSTRUCTED 4X A DAY. DAY SUPPLY PER INSURANCE.   pantoprazole (PROTONIX) 40 MG tablet Take 1 tablet (40 mg total) by mouth daily.   sildenafil (VIAGRA) 100 MG tablet TAKE 0.5-1 TABLETS (50-100 MG TOTAL) BY MOUTH DAILY AS NEEDED FOR ERECTILE DYSFUNCTION.   simvastatin (ZOCOR) 20 MG tablet TAKE 1 TABLET BY MOUTH  EVERYDAY AT BEDTIME (Patient taking differently: Take 20 mg by mouth at bedtime. TAKE 1 TABLET BY MOUTH EVERYDAY AT BEDTIME)   tadalafil (CIALIS) 20 MG tablet Take 0.5-1 tablets (10-20 mg total) by mouth every other day as needed for erectile dysfunction.   traMADol (ULTRAM) 50 MG tablet Take 1 tablet (50 mg total) by mouth every 6 (six) hours as needed for moderate pain.   TRESIBA FLEXTOUCH 200 UNIT/ML FlexTouch Pen INJECT 32 UNITS INTO THE SKIN DAILY.   No facility-administered encounter medications on file as of 08/31/2020.    Current Diagnosis: Patient Active Problem List   Diagnosis Date Noted   COPD (chronic obstructive pulmonary disease) ([name]) 02/28/2020   Coronary artery calcification seen on CAT scan 02/28/2020   Aortic atherosclerosis ([name]) 02/28/2020   Ex-smoker 01/21/2020   RLQ abdominal pain 01/21/2020   Numbness and tingling in left arm 01/21/2020   Health maintenance examination 01/05/2017   Right knee pain 01/18/2016   Barrett's esophagus 12/08/2015   Abnormal tympanic membrane of right ear 07/05/2015   Advanced care planning/counseling discussion 06/30/2014   Medicare annual wellness visit, subsequent 06/22/2013   Benign prostatic hyperplasia with urinary obstruction 06/18/2013   ED (erectile dysfunction) 04/10/2012   Hyperlipidemia associated with type 2 diabetes mellitus ([name]) 12/29/2008   LEG PAIN, BILATERAL 11/19/2008   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 ([name]) 07/24/2007   ANXIETY 07/24/2007   Essential hypertension 07/24/2007  GERD 07/24/2007   Followed up with patient to inform him Cialis was sent to his pharmacy. He states it was too expensive and he did not buy it. He did mention an injection that his brother had done before. He would like to discuss trying this if it is an option.   Follow-Up:  Pharmacist Review   Phil Dopp, CPP notified  Jomarie Longs, [facility] Clinical Pharmacy  Assistant [phone]  Please see if patient would be like to use a GoodRx coupon. Karin Golden has a coupon for $15.53 for 30 tablets of Cialis 20 mg. [facility] $17.99. [facility] $25.51. I can send him the coupon by text message if he is interested. [facility] not [facility].  Thanks,  Phil Dopp, PharmD Clinical Pharmacist [facility] at [facility] [phone]

## 2020-08-31 NOTE — Progress Notes (Signed)
Cardiology Office Note:    Date:  08/31/2020   ID:  Anthony KrabbeDonald R Strauser, DOB 12-25-1945, MRN [id number]  PCP:  Eustaquio BoydenGutierrez, Javier, MD  [facility] Cardiologist:  No primary care provider on file.  [facility] Electrophysiologist:  None   CC: Shortness of breath Consulted for the evaluation of coronary artery calcification at the behest of Eustaquio BoydenGutierrez, Javier, MD  History of Present Illness:    Anthony [name] is a 75 y.o. male with a hx of CAC, Aortic Atherosclerosis, DM with HTN, HLD, Erectile Dysfunction, COPD former smoker who presents for evaluation.  Patient notes that he is feeling ok today.  Feels overly tired int he mornings, Everyone in a while feels shortness of breath.    Has had no chest pain, chest pressure, chest tightness, chest stinging.  Patient exertion notable for pretty yardwork with taking care of trees and feels no symptoms.  No shortness of breath, DOE.  No PND or orthopnea.  No bendopnea, leg swelling , or abdominal swelling.  No syncope or near syncope.  Notes  no palpitations or funny heart beats.     Patient reports prior cardiac testing including distant stress test 15 years prior.  Ambulatory BP not done.  Past Medical History:  Diagnosis Date  . Anxiety    denies  . Arthritis    a little  . Barrett's esophagus 12/08/2015   By EGD 2010 Jarold Motto([place])   . BPH with obstruction/lower urinary tract symptoms 2014   s/p TURP  . Depression    denies  . DKA (diabetic ketoacidoses) 01/2010   "in coma"  . GERD (gastroesophageal reflux disease)   . H/O bronchitis    after every cold  . HTN (hypertension)   . Hyperlipidemia    controlled with medicine  . Otitis media   . Rheumatic fever    maybe as child  . Type 2 diabetes, uncontrolled, with mild 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 macular edema ([name])    completed DMSE    Past Surgical History:  Procedure Laterality Date  . ABI  12/2013   WNL  . APPENDECTOMY    . CHOLECYSTECTOMY     years ago  .  COLONOSCOPY  01/2009   TA, rec rpt 5 yrs Jarold Motto([place])  . COLONOSCOPY  01/2018   TAx2, diverticulosis, int hemorrhoids, rpt 5 yrs ([name])  . ESOPHAGOGASTRODUODENOSCOPY  01/2009   biopsy with Barrett's  . ESOPHAGOGASTRODUODENOSCOPY  01/2018   WNL - no barrett's ([name])  . INGUINAL HERNIA REPAIR Bilateral 03/04/2020   Procedure: LAPAROSCOPIC BILATERAL INGUINAL HERNIA REPAIR WITH MESH;  Surgeon: Abigail MiyamotoBlackman, Douglas, MD;  [facility];  Service: General;  Laterality: Bilateral;  . LAPAROSCOPIC APPENDECTOMY  07/05/2011   Procedure: APPENDECTOMY LAPAROSCOPIC;  Surgeon: Almond LintFaera Byerly, MD;  Location: [facility] OR;  Service: General;  Laterality: N/A;  . MENISCUS REPAIR Right 2018   Jodi GeraldsJohn Graves @ [facility]  . TRANSURETHRAL RESECTION OF PROSTATE N/A 07/28/2013   Procedure: TRANSURETHRAL RESECTION OF THE PROSTATE WITH GYRUS INSTRUMENTS;  Surgeon: Garnett FarmMark C Ottelin, MD  . VASECTOMY    . WISDOM TOOTH EXTRACTION      Current Medications: Current Meds  Medication Sig  . amLODipine (NORVASC) 5 MG tablet Take 1 tablet (5 mg total) by mouth daily.  [name] Kitchen. b complex vitamins tablet Take 1 tablet by mouth daily.  . Continuous Blood Gluc Sensor (DEXCOM G6 SENSOR) MISC Inject 1 Device into the skin continuous for 10 days.  . Continuous Blood Gluc Transmit (DEXCOM G6 TRANSMITTER) MISC 1 Device  by Does not apply route every 3 (three) months.  . Dextrose, Diabetic Use, (GLUCOSE PO) Take 1 tablet by mouth as needed.  [name] Kitchen glucagon (GLUCAGON EMERGENCY) 1 MG injection Inject 1 mg into the muscle once as needed for up to 1 dose.  . insulin aspart (NOVOLOG FLEXPEN) 100 UNIT/ML FlexPen Inject 10-18 Units into the skin 3 (three) times daily with meals. (Patient taking differently: Inject 12-14 Units into the skin 3 (three) times daily with meals.)  . Insulin Pen Needle (ADVOCATE INSULIN PEN NEEDLES) 31G X 5 MM MISC Use 3x a day  . metFORMIN (GLUCOPHAGE-XR) 500 MG 24 hr tablet TAKE 2 TABLETS (1,000 MG TOTAL) BY MOUTH DAILY. TAKE 2  TABLETS BY MOUTH TWICE DAILY WITH MEALS (Patient taking differently: 500 mg daily with breakfast.)  . olmesartan-hydrochlorothiazide (BENICAR HCT) 40-25 MG tablet Take 1 tablet by mouth daily.  Letta Pate ULTRA test strip USE AS INSTRUCTED 4X A DAY. DAY SUPPLY PER INSURANCE.  . pantoprazole (PROTONIX) 40 MG tablet Take 1 tablet (40 mg total) by mouth daily.  . sildenafil (VIAGRA) 100 MG tablet TAKE 0.5-1 TABLETS (50-100 MG TOTAL) BY MOUTH DAILY AS NEEDED FOR ERECTILE DYSFUNCTION.  . simvastatin (ZOCOR) 20 MG tablet TAKE 1 TABLET BY MOUTH EVERYDAY AT BEDTIME (Patient taking differently: Take 20 mg by mouth at bedtime. TAKE 1 TABLET BY MOUTH EVERYDAY AT BEDTIME)  . tadalafil (CIALIS) 20 MG tablet Take 0.5-1 tablets (10-20 mg total) by mouth every other day as needed for erectile dysfunction.  . traMADol (ULTRAM) 50 MG tablet Take 1 tablet (50 mg total) by mouth every 6 (six) hours as needed for moderate pain.  . TRESIBA FLEXTOUCH 200 UNIT/ML FlexTouch Pen INJECT 32 UNITS INTO THE SKIN DAILY.     Allergies:   Patient has no known allergies.   Social History   Socioeconomic History  . Marital status: Married    Spouse name: Not on file  . Number of children: Not on file  . Years of education: Not on file  . Highest education level: Not on file  Occupational History  . Not on file  Tobacco Use  . Smoking status: Former Smoker    Packs/day: 0.30    Years: 50.00    Pack years: 15.00    Types: Cigarettes    Quit date: 08/29/2011    Years since quitting: 9.0  . Smokeless tobacco: Never Used  Vaping Use  . Vaping Use: Never used  Substance and Sexual Activity  . Alcohol use: Yes    Alcohol/week: 7.0 standard drinks    Types: 7 Standard drinks or equivalent per week    Comment: 1- 2 ounces a night  . Drug use: No  . Sexual activity: Not Currently  Other Topics Concern  . Not on file  Social History Narrative   MD ROSTER:   GI Jarold Motto   GS - Young      Daily Caffeine Use:  2    Owner/operater of dental lab-sold, but continues to work. Fully retired.   Married 1969   2 daughters 1972, 30; 1 son 42   7 grandchildren   Edu: 10th Grade   Hobby: car restoration: has '66 Vette, two classic Chevelle SS's and is restoring a '37 chevy coup   Regular Exercise -  NO   Diet: good water, fruits/vegetables daily   Social Determinants of Health   Financial Resource Strain: Low Risk   . Difficulty of Paying Living Expenses: Not very hard  Food Insecurity:  No Food Insecurity  . Worried About Charity fundraiser in the Last Year: Never true  . Ran Out of Food in the Last Year: Never true  Transportation Needs: No Transportation Needs  . Lack of Transportation (Medical): No  . Lack of Transportation (Non-Medical): No  Physical Activity: Inactive  . Days of Exercise per Week: 0 days  . Minutes of Exercise per Session: 0 min  Stress: No Stress Concern Present  . Feeling of Stress : Not at all  Social Connections: Not on file    [employer]; worked at Pharmacist, community  Family History: The patient's family history includes Alcohol abuse in his father; Cancer in his father; Coronary artery disease in his mother; Diabetes in his brother, brother, brother and another family member; Heart disease in his mother; Lymphoma in his mother; Pancreatic cancer in his maternal uncle. There is no history of Colon cancer, Lung cancer, Esophageal cancer, Rectal cancer, or Stomach cancer. History of coronary artery disease notable for mother. History of heart failure notable for no members. History of arrhythmia notable for no members. No history of bicuspid aortic valve or aortic aneurysm or dissection.  ROS:   Please see the history of present illness.    Notes bilateral knee pain and swelling.  All other systems reviewed and are negative.  EKGs/Labs/Other Studies Reviewed:    The following studies were reviewed today:  EKG:   07/28/20: Sinus 75 LAD  NonCardiac CT: Date:  02/25/2020 Results:Distal LAD and D1 calcium, aortic arch atherosclerosis  Recent Labs: 01/12/2020: ALT 13 03/02/2020: BUN 12; Creatinine, Ser 1.02; Hemoglobin 14.5; Platelets 227; Potassium 3.6; Sodium 139  Recent Lipid Panel    Component Value Date/Time   CHOL 119 01/12/2020 1021   TRIG 116.0 01/12/2020 1021   HDL 37.70 (L) 01/12/2020 1021   CHOLHDL 3 01/12/2020 1021   VLDL 23.2 01/12/2020 1021   LDLCALC 58 01/12/2020 1021   LDLDIRECT 94.0 10/05/2016 1728   Risk Assessment/Calculations:     N/A  Physical Exam:    VS:  BP (!) 148/70   Pulse 83   Ht 6' (1.829 m)   Wt 219 lb (99.3 kg)   SpO2 97%   BMI 29.70 kg/m     Wt Readings from Last 3 Encounters:  08/31/20 219 lb (99.3 kg)  08/12/20 218 lb 3.2 oz (99 kg)  07/27/20 221 lb 5 oz (100.4 kg)     GEN: Well nourished, well developed in no acute distress HEENT: Normal NECK: No JVD; No carotid bruits LYMPHATICS: No lymphadenopathy CARDIAC: RRR, no murmurs, rubs, gallops RESPIRATORY:  Good air movement with bilateral expiratory wheezes ABDOMEN: Soft, non-tender, non-distended MUSCULOSKELETAL:  No edema; No deformity  SKIN: Warm and dry NEUROLOGIC:  Alert and oriented x 3 PSYCHIATRIC:  Normal affect   ASSESSMENT:    1. Aortic atherosclerosis ([place])   2. Coronary artery calcification seen on CAT scan   3. Other hyperlipidemia   4. Vasculogenic erectile dysfunction, unspecified vasculogenic erectile dysfunction type   5. Diabetes mellitus with coincident hypertension ([name])    PLAN:    Aortic atherosclerosis Coronary Artery Hyperlipidemia (mixed) Erectile Dysfunction -LDL goal less than 70 -continue current statin -Recheck lipid profile LFTs - gave education on dietary changes; discussed erectile dysfunction and HLD - discussed indications for stress testing (would do CCTA if new sx occur given wheezing and knee problems)  Diabetes with hypertension - ambulatory blood pressure not done, will start ambulatory BP  monitoring; gave education on how to  perform ambulatory blood pressure monitoring including the frequency and technique; goal ambulatory blood pressure < 135/85 on average - continue home medications; if patient reaches out with elevated BP, will get CCTA +/- FFR (significant COPD would make NM more challenging - discussed diet (DASH/low sodium), and exercise/weight loss interventions  3 months follow up unless new symptoms or abnormal test results warranting change in plan  Would be reasonable for  Virtual Follow up  Would be reasonable for  APP Follow up   Medication Adjustments/Labs and Tests Ordered: Current medicines are reviewed at length with the patient today.  Concerns regarding medicines are outlined above.  Orders Placed This Encounter  Procedures  . Lipid panel   No orders of the defined types were placed in this encounter.   Patient Instructions  Medication Instructions:  *If you need a refill on your cardiac medications before your next appointment, please call your pharmacy*  Lab Work: Your physician recommends that you have lab work today- Lipid panel  If you have labs (blood work) drawn today and your tests are completely normal, you will receive your results only by: [name] Kitchen MyChart Message (if you have MyChart) OR . A paper copy in the mail If you have any lab test that is abnormal or we need to change your treatment, we will call you to review the results.  Testing/Procedures: None ordered today.  Follow-Up: At [facility], you and your health needs are our priority.  As part of our continuing mission to provide you with exceptional heart care, we have created designated Provider Care Teams.  These Care Teams include your primary Cardiologist (physician) and Advanced Practice Providers (APPs -  Physician Assistants and Nurse Practitioners) who all work together to provide you with the care you need, when you need it.  We recommend signing up for the patient  portal called "MyChart".  Sign up information is provided on this After Visit Summary.  MyChart is used to connect with patients for Virtual Visits (Telemedicine).  Patients are able to view lab/test results, encounter notes, upcoming appointments, etc.  Non-urgent messages can be sent to your provider as well.   To learn more about what you can do with MyChart, go to [email or web address].    Your next appointment:   3 month(s)  The format for your next appointment:   In Person  Provider:   You may see Dr. Gasper Sells or one of the following Advanced Practice Providers on your designated Care Team:    Melina Copa, PA-C  Ermalinda Barrios, PA-C      Signed, Werner Lean, MD  08/31/2020 2:27 PM    [place]

## 2020-09-01 LAB — LIPID PANEL
Chol/HDL Ratio: 3.1 ratio (ref 0.0–5.0)
Cholesterol, Total: 132 mg/dL (ref 100–199)
HDL: 43 mg/dL (ref >39)
LDL Chol Calc (NIH): 69 mg/dL (ref 0–99)
Triglycerides: 112 mg/dL (ref 0–149)
VLDL Cholesterol Cal: 20 mg/dL (ref 5–40)

## 2020-09-06 ENCOUNTER — Ambulatory Visit: Payer: Medicare HMO | Admitting: Podiatry

## 2020-09-15 ENCOUNTER — Ambulatory Visit: Payer: Medicare HMO | Admitting: Podiatry

## 2020-09-15 ENCOUNTER — Other Ambulatory Visit: Payer: Self-pay

## 2020-09-15 ENCOUNTER — Encounter: Payer: Self-pay | Admitting: Podiatry

## 2020-09-15 DIAGNOSIS — B351 Tinea unguium: Secondary | ICD-10-CM

## 2020-09-15 DIAGNOSIS — L853 Xerosis cutis: Secondary | ICD-10-CM | POA: Diagnosis not present

## 2020-09-15 DIAGNOSIS — L84 Corns and callosities: Secondary | ICD-10-CM | POA: Diagnosis not present

## 2020-09-15 DIAGNOSIS — E1142 Type 2 diabetes mellitus with diabetic polyneuropathy: Secondary | ICD-10-CM

## 2020-09-15 DIAGNOSIS — M79675 Pain in left toe(s): Secondary | ICD-10-CM | POA: Diagnosis not present

## 2020-09-15 DIAGNOSIS — M79674 Pain in right toe(s): Secondary | ICD-10-CM

## 2020-09-15 NOTE — Progress Notes (Signed)
Subjective: Anthony [name] presents today for at risk foot care with history of diabetic neuropathy and callus(es)  b/l and painful thick toenails that are difficult to trim. Painful toenails interfere with ambulation. Aggravating factors include wearing enclosed shoe gear. Pain is relieved with periodic professional debridement. Painful calluses are aggravated when weightbearing with and without shoegear. Pain is relieved with periodic professional debridement.Anthony Bush, MD is patient's PCP. Last visit was 07/27/2020. He was seeing [name] and last visit was 08/12/2020.  He voices no new pedal problems on today's visit.  Past Medical History:  Diagnosis Date  . Anxiety    denies  . Arthritis    a little  . Barrett's esophagus 12/08/2015   By EGD 2010 Anthony [name])   . BPH with obstruction/lower urinary tract symptoms 2014   s/p TURP  . Depression    denies  . DKA (diabetic ketoacidoses) 01/2010   "in coma"  . GERD (gastroesophageal reflux disease)   . H/O bronchitis    after every cold  . HTN (hypertension)   . Hyperlipidemia    controlled with medicine  . Otitis media   . Rheumatic fever    maybe as child  . Type 2 diabetes, uncontrolled, with mild 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 macular edema ([name])    completed DMSE    Patient Active Problem List   Diagnosis Date Noted  . Other hyperlipidemia 08/31/2020  . COPD (chronic obstructive pulmonary disease) ([place]) 02/28/2020  . Coronary artery calcification seen on CAT scan 02/28/2020  . Aortic atherosclerosis (Anthony [name]) 02/28/2020  . Ex-smoker 01/21/2020  . RLQ abdominal pain 01/21/2020  . Numbness and tingling in left arm 01/21/2020  . Health maintenance examination 01/05/2017  . Right knee pain 01/18/2016  . Barrett's esophagus 12/08/2015  . Abnormal tympanic membrane of right ear 07/05/2015  . Advanced care planning/counseling discussion 06/30/2014  . Medicare annual wellness visit, subsequent  06/22/2013  . Benign prostatic hyperplasia with urinary obstruction 06/18/2013  . ED (erectile dysfunction) 04/10/2012  . Hyperlipidemia associated with type 2 diabetes mellitus (Anthony [name]) 12/29/2008  . LEG PAIN, BILATERAL 11/19/2008  . 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 (Anthony [name]) 07/24/2007  . ANXIETY 07/24/2007  . Diabetes mellitus with coincident hypertension (Anthony [name]) 07/24/2007  . GERD 07/24/2007    Past Surgical History:  Procedure Laterality Date  . ABI  12/2013   WNL  . APPENDECTOMY    . CHOLECYSTECTOMY     years ago  . COLONOSCOPY  01/2009   TA, rec rpt 5 yrs Anthony [name])  . COLONOSCOPY  01/2018   TAx2, diverticulosis, int hemorrhoids, rpt 5 yrs (Anthony [name])  . ESOPHAGOGASTRODUODENOSCOPY  01/2009   biopsy with Barrett's  . ESOPHAGOGASTRODUODENOSCOPY  01/2018   WNL - no barrett's (Anthony [name])  . INGUINAL HERNIA REPAIR Bilateral 03/04/2020   Procedure: LAPAROSCOPIC BILATERAL INGUINAL HERNIA REPAIR WITH MESH;  Surgeon: [name], MD;  Location: [facility];  Service: General;  Laterality: Bilateral;  . LAPAROSCOPIC APPENDECTOMY  07/05/2011   Procedure: APPENDECTOMY LAPAROSCOPIC;  Surgeon: [name], MD;  Location: [facility];  Service: General;  Laterality: N/A;  . MENISCUS REPAIR Right 2018   [name] @ [facility]  . TRANSURETHRAL RESECTION OF PROSTATE N/A 07/28/2013   Procedure: TRANSURETHRAL RESECTION OF THE PROSTATE WITH GYRUS INSTRUMENTS;  Surgeon: [name], MD  . VASECTOMY    . WISDOM TOOTH EXTRACTION      Current Outpatient Medications on File Prior to Visit  Medication Sig Dispense Refill  . amLODipine (  NORVASC) 5 MG tablet Take 1 tablet (5 mg total) by mouth daily. 90 tablet 3  . b complex vitamins tablet Take 1 tablet by mouth daily.    . Continuous Blood Gluc Transmit (DEXCOM G6 TRANSMITTER) MISC 1 Device by Does not apply route every 3 (three) months. 1 each 3  . Dextrose, Diabetic Use, (GLUCOSE PO) Take 1 tablet by mouth  as needed.    [name] Kitchen glucagon (GLUCAGON EMERGENCY) 1 MG injection Inject 1 mg into the muscle once as needed for up to 1 dose. 1 each 12  . insulin aspart (NOVOLOG FLEXPEN) 100 UNIT/ML FlexPen Inject 10-18 Units into the skin 3 (three) times daily with meals. (Patient taking differently: Inject 12-14 Units into the skin 3 (three) times daily with meals.) 48.6 mL 1  . Insulin Pen Needle (ADVOCATE INSULIN PEN NEEDLES) 31G X 5 MM MISC Use 3x a day 300 each 3  . metFORMIN (GLUCOPHAGE-XR) 500 MG 24 hr tablet TAKE 2 TABLETS (1,000 MG TOTAL) BY MOUTH DAILY. TAKE 2 TABLETS BY MOUTH TWICE DAILY WITH MEALS (Patient taking differently: 500 mg daily with breakfast.) 360 tablet 0  . olmesartan-hydrochlorothiazide (BENICAR HCT) 40-25 MG tablet Take 1 tablet by mouth daily. 90 tablet 3  . ONETOUCH ULTRA test strip USE AS INSTRUCTED 4X A DAY. DAY SUPPLY PER INSURANCE. 300 strip 1  . pantoprazole (PROTONIX) 40 MG tablet Take 1 tablet (40 mg total) by mouth daily. 90 tablet 3  . sildenafil (VIAGRA) 100 MG tablet TAKE 0.5-1 TABLETS (50-100 MG TOTAL) BY MOUTH DAILY AS NEEDED FOR ERECTILE DYSFUNCTION. 10 tablet 3  . simvastatin (ZOCOR) 20 MG tablet TAKE 1 TABLET BY MOUTH EVERYDAY AT BEDTIME (Patient taking differently: Take 20 mg by mouth at bedtime. TAKE 1 TABLET BY MOUTH EVERYDAY AT BEDTIME) 90 tablet 3  . tadalafil (CIALIS) 20 MG tablet Take 0.5-1 tablets (10-20 mg total) by mouth every other day as needed for erectile dysfunction. 5 tablet 6  . traMADol (ULTRAM) 50 MG tablet Take 1 tablet (50 mg total) by mouth every 6 (six) hours as needed for moderate pain. 25 tablet 0  . TRESIBA FLEXTOUCH 200 UNIT/ML FlexTouch Pen INJECT 32 UNITS INTO THE SKIN DAILY. 9 pen 2   No current facility-administered medications on file prior to visit.     No Known Allergies  Social History   Occupational History  . Not on file  Tobacco Use  . Smoking status: Former Smoker    Packs/day: 0.30    Years: 50.00    Pack years: 15.00     Types: Cigarettes    Quit date: 08/29/2011    Years since quitting: 9.0  . Smokeless tobacco: Never Used  Vaping Use  . Vaping Use: Never used  Substance and Sexual Activity  . Alcohol use: Yes    Alcohol/week: 7.0 standard drinks    Types: 7 Standard drinks or equivalent per week    Comment: 1- 2 ounces a night  . Drug use: No  . Sexual activity: Not Currently    Family History  Problem Relation Age of Onset  . Lymphoma Mother        Non-hodgkins  . Coronary artery disease Mother   . Heart disease Mother   . Cancer Father   . Alcohol abuse Father   . Diabetes Brother   . Pancreatic cancer Maternal Uncle   . Diabetes Brother   . Diabetes Brother   . Diabetes Other   . Colon cancer Neg Hx   .  Lung cancer Neg Hx   . Esophageal cancer Neg Hx   . Rectal cancer Neg Hx   . Stomach cancer Neg Hx     Immunization History  Administered Date(s) Administered  . Fluad Quad(high Dose 65+) 06/21/2020  . Influenza Whole 06/02/2008, 06/02/2009, 06/02/2010, 07/08/2011, 07/08/2012  . Influenza, High Dose Seasonal PF 05/18/2017, 06/25/2018, 04/18/2019  . Influenza,inj,Quad PF,6+ Mos 07/05/2015  . Influenza-Unspecified 05/28/2013, 05/28/2014, 06/30/2016  . PFIZER(Purple Top)SARS-COV-2 Vaccination 10/27/2019, 11/25/2019  . Pneumococcal Conjugate-13 06/18/2013  . Pneumococcal Polysaccharide-23 07/08/2011, 12/29/2016  . Tetanus 06/18/2013  . Zoster 08/26/2014  . Zoster Recombinat (Shingrix) 05/18/2017, 01/14/2018     Objective: There were no vitals filed for this visit.  Anthony [name] is a pleasant 75 y.o. Caucasian  male in NAD. AAO X 3.  Vascular Examination: Capillary refill time to digits immediate b/l. Palpable pedal pulses b/l LE. Pedal hair absent. Lower extremity skin temperature gradient within normal limits. No pain with calf compression b/l. No edema noted b/l lower extremities.  Dermatological Examination: Pedal skin with normal turgor, texture and tone bilaterally.  No open wounds bilaterally. No interdigital macerations bilaterally. Toenails 1-5 b/l elongated, discolored, dystrophic, thickened, crumbly with subungual debris and tenderness to dorsal palpation. Hyperkeratotic lesion(s) submet head 1 right foot.  No erythema, no edema, no drainage, no fluctuance. Pedal skin noted to be dry and flaky left foot and right foot. There is early cracking of skin noted around left heel and left forefoot area. No erythema, no bleeding, no drainage, no edema.  Musculoskeletal Examination: Normal muscle strength 5/5 to all lower extremity muscle groups bilaterally. No pain crepitus or joint limitation noted with ROM b/l. Hallux valgus with bunion deformity noted b/l lower extremities. Hammertoes noted to the 2-5 bilaterally.  Neurological Examination: Protective sensation diminished with 10g monofilament b/l. Clonus negative b/l.  Hemoglobin A1C Latest Ref Rng & Units 07/27/2020 03/02/2020 01/12/2020  HGBA1C 4.0 - 5.6 % 7.5(A) 7.7(H) 7.8(H)  Some recent data might be hidden   Assessment: 1. Pain due to onychomycosis of toenails of both feet   2. Callus   3. Xerosis cutis   4. Diabetic peripheral neuropathy associated with type 2 diabetes mellitus ([place])     Plan: -Examined patient. -Continue diabetic foot care principles. -For xerosis, dispensed list of moisturizers. Use ointment due to dryness and cracking. Apply OTC ointment to both feet once daily avoiding application between toes. -Toenails 1-5 b/l were debrided in length and girth with sterile nail nippers and dremel without iatrogenic bleeding.  -Callus(es) submet head 1 right foot pared utilizing sterile scalpel blade without complication or incident. Total number debrided =1. -Patient to report any pedal injuries to medical professional immediately. -Patient to continue soft, supportive shoe gear daily. -Patient/POA to call should there be question/concern in the interim.  Return in about 3 months (around  12/14/2020).  Marzetta Board, DPM

## 2020-09-15 NOTE — Patient Instructions (Signed)
For extremely dry, cracked feet: moisturize feet once daily; do not apply between toes A. CeraVe Healing Ointment B. Aquaphor Healing Ointment C. Vaseline Petroleum Healing Jelly   If you have problems reaching your feet: apply to feet once daily; do not apply between toes A.  Aquaphor Advanced Therapy Ointment Body Spray B.  Vaseline Intensive Care Spray Lotion Advanced Repair   

## 2020-09-20 ENCOUNTER — Ambulatory Visit: Payer: Medicare HMO

## 2020-09-20 ENCOUNTER — Other Ambulatory Visit: Payer: Self-pay

## 2020-09-20 ENCOUNTER — Ambulatory Visit: Payer: Self-pay

## 2020-09-20 DIAGNOSIS — E7849 Other hyperlipidemia: Secondary | ICD-10-CM

## 2020-09-20 DIAGNOSIS — E113299 Type 2 diabetes mellitus with mild nonproliferative diabetic retinopathy without macular edema, unspecified eye: Secondary | ICD-10-CM

## 2020-09-20 NOTE — Chronic Care Management (AMB) (Signed)
  Chronic Care Management   Outreach Note  09/20/2020 Name: Anthony [name] MRN: [id number] DOB: Jul 31, 1946  Referred by: [name], MD  Attempted to reach patient by telephone for CCM visit 09/20/20 at 1 PM. Reached patient's wife, she requested I call back later this afternoon. He would be home in about an hour.  Debbora Dus, PharmD Clinical Pharmacist [facility] at [facility] [phone]

## 2020-09-20 NOTE — Chronic Care Management (AMB) (Unsigned)
Chronic Care Management Pharmacy  Name: RICHARDSON DUBREE  MRN: [id number] DOB: 08/07/1946  Chief Complaint/ HPI  [name],  75 y.o. , male presents for their Follow-Up CCM visit with the clinical pharmacist via telephone. Patient reports he had a good Christmas, saw his whole family.   PCP : [name], MD  Their chronic conditions include: HTN, COPD, GERD, DM, HLD, BPH, anxiety, ED, leg pain  Office Visits:  07/27/20: Larene Beach called pt to discuss dexcom education, asked pt to download app and call to schedule appt with nurse for education  07/27/20: PCP/Gutierrez - brittle diabetes, schedule follow up with endo, Dexcom sample, self-tapering meds, unclear about Novolog dose   01/20/20: PCP/Gutierrez - suspect right inguinal hernia; numbness in left arm - suggest vitamin B12 trial for 1 month; LDL at goal on simvastatin; GERD on PPI; referral for cancer screening; HTN stable; DM uncontrolled. Encouraged endo f/u; BPH stable, chronic, s/p TURP   Consult Visit:   08/12/20: Endocrinology - take Novolog 15 min before meals, keep log of when BG is high (missed doses, etc), continue Tresiba 32 units daily, metformin ER 500 mg daily, Novolog sliding scale - 12 units small meals, 16 regular meals, 18 large meals   06/02/20: Podiatry - annual DM foot exam   02/25/20: Pulmonology - lung cancer screening program  No Known Allergies   Medications: Outpatient Encounter Medications as of 09/20/2020  Medication Sig  . amLODipine (NORVASC) 5 MG tablet Take 1 tablet (5 mg total) by mouth daily.  [name] Kitchen b complex vitamins tablet Take 1 tablet by mouth daily.  . Continuous Blood Gluc Transmit (DEXCOM G6 TRANSMITTER) MISC 1 Device by Does not apply route every 3 (three) months.  . Dextrose, Diabetic Use, (GLUCOSE PO) Take 1 tablet by mouth as needed.  [name] Kitchen glucagon (GLUCAGON EMERGENCY) 1 MG injection Inject 1 mg into the muscle once as needed for up to 1 dose.  . insulin aspart (NOVOLOG  FLEXPEN) 100 UNIT/ML FlexPen Inject 10-18 Units into the skin 3 (three) times daily with meals. (Patient taking differently: Inject 12-14 Units into the skin 3 (three) times daily with meals.)  . Insulin Pen Needle (ADVOCATE INSULIN PEN NEEDLES) 31G X 5 MM MISC Use 3x a day  . metFORMIN (GLUCOPHAGE-XR) 500 MG 24 hr tablet TAKE 2 TABLETS (1,000 MG TOTAL) BY MOUTH DAILY. TAKE 2 TABLETS BY MOUTH TWICE DAILY WITH MEALS (Patient taking differently: 500 mg daily with breakfast.)  . olmesartan-hydrochlorothiazide (BENICAR HCT) 40-25 MG tablet Take 1 tablet by mouth daily.  Glory Rosebush ULTRA test strip USE AS INSTRUCTED 4X A DAY. DAY SUPPLY PER INSURANCE.  . pantoprazole (PROTONIX) 40 MG tablet Take 1 tablet (40 mg total) by mouth daily.  . sildenafil (VIAGRA) 100 MG tablet TAKE 0.5-1 TABLETS (50-100 MG TOTAL) BY MOUTH DAILY AS NEEDED FOR ERECTILE DYSFUNCTION.  . simvastatin (ZOCOR) 20 MG tablet TAKE 1 TABLET BY MOUTH EVERYDAY AT BEDTIME (Patient taking differently: Take 20 mg by mouth at bedtime. TAKE 1 TABLET BY MOUTH EVERYDAY AT BEDTIME)  . tadalafil (CIALIS) 20 MG tablet Take 0.5-1 tablets (10-20 mg total) by mouth every other day as needed for erectile dysfunction.  . traMADol (ULTRAM) 50 MG tablet Take 1 tablet (50 mg total) by mouth every 6 (six) hours as needed for moderate pain.  . TRESIBA FLEXTOUCH 200 UNIT/ML FlexTouch Pen INJECT 32 UNITS INTO THE SKIN DAILY.   No facility-administered encounter medications on file as of 09/20/2020.    Current Diagnosis/Assessment: Goals    .  Patient Stated     Starting 01/09/2019, I will continue to taking medication as prescribed.     . Patient Stated     01/12/2020, I will maintain and continue medications as prescribed.    . Pharmacy Care Plan     CARE PLAN ENTRY (see longitudinal plan of care for additional care plan information)  Current Barriers:  . Chronic Disease Management support, education, and care coordination needs related to Hyperlipidemia  and Diabetes   Hyperlipidemia Lab Results  Component Value Date/Time   LDLCALC 69 08/31/2020 02:37 PM   LDLDIRECT 94.0 10/05/2016 05:28 PM   . Pharmacist Clinical Goal(s): o Over the next 4 weeks, patient will work with PharmD and providers to maintain LDL goal < 100 . Current regimen:  o Simvastatin 20 mg - 1 tablet daily  . Interventions: o Reviewed adherence, refills timely . Patient self care activities - Over the next 4 weeks, patient will: o Continue medication as prescribed . Slowly increase exercise with goal of 30 minutes, 5 days per week . Incorporate a healthy diet high in vegetables, fruits and whole grains with low-fat dairy products, chicken, fish, legumes, non-tropical vegetable oils and nuts. Limit intake of sweets, sugar-sweetened beverages and red meats.  Diabetes Lab Results  Component Value Date/Time   HGBA1C 7.5 (A) 07/27/2020 03:13 PM   HGBA1C 7.7 (H) 03/02/2020 10:26 AM   HGBA1C 7.8 (H) 01/12/2020 10:21 AM .  Pharmacist Clinical Goal(s): o Over the next 4 weeks, patient will work with PharmD and providers to achieve A1c goal <7% and prevent hypoglycemia  . Current regimen:   Metformin 500 mg - 1 tablet daily  Insulin aspart (NovoLog) - 12 units small meals, 16 regular meals, 18 large meal three times daily 5-10 minutes before meals   Tresiba - 32 units daily . Interventions: o Adjust medication timing to prevent hypoglycemia - take Novolog before meals  o Consult with Dr. Cruzita Lederer to consider Ozempic o Coordinate new insurance information to [place]  . Patient self care activities - Over the next 4 weeks, patient will: o Check blood sugar 3-4 times daily, document, and provide at future appointments o Start Ozempic 0.25 mg weekly for 4 weeks, then increase to 0.5 mg weekly if tolerated o Contact provider with any episodes of hypoglycemia  Please see past updates related to this goal by clicking on the "Past Updates" button in the selected goal        Diabetes   Followed by Dr. Cruzita Lederer, last appt 07/2020 Recent Relevant Labs: Lab Results  Component Value Date/Time   HGBA1C 7.5 (A) 07/27/2020 03:13 PM   HGBA1C 7.7 (H) 03/02/2020 10:26 AM   HGBA1C 7.8 (H) 01/12/2020 10:21 AM   MICROALBUR 6.3 (H) 06/28/2015 09:39 AM   MICROALBUR 4.4 (H) 06/26/2014 09:05 AM    Patient has failed these meds in past: Lantus Patient is currently uncontrolled on the following medications:   Metformin 500 mg - 1 tablet daily (max tolerated dose)  Insulin aspart - 12 units small meals, 16 regular meals, 18 large meal TID 5-10 minutes before meals   Tresiba - 32-36 units daily at bedtime  Update 09/20/20: Patient reports he feels good about using Dexcom now but needs help downloading the app. He will ask his son. He has not heard from [facility]. Contacted [employer] and they report they need his updated insurance. Report leaving patient a VM. He is checking BG 5 times a day and keeping log for Dr. Cruzita Lederer. He  reports some confusion about timing of checks. Checking 2 hours before and after meals. He will call Dr. Cruzita Lederer office for clarification.  We discussed Ozempic due to history of hypoglycemia and he is very interested. Discussed medication is tier 3. Will check price at pharmacy.  He is currently taking Tresiba 32-36 units every night. Novolog before meals 14-16 units depending on sugar level, if 300-400 will take 18 units. At times he will wait 1-2 hours after taking his Novolog in the morning before eating. Discouraged this due to risk of hypoglycemia.   Plan: Continue current medications; Consult endo to consider starting Ozempic - Dr. Cruzita Lederer agreed. Coordinated delivery of Ozempic to patient from [facility].   Hyperlipidemia   LDL goal < 100  Last lipids Lab Results  Component Value Date   CHOL 132 08/31/2020   HDL 43 08/31/2020   LDLCALC 69 08/31/2020   LDLDIRECT 94.0 10/05/2016   TRIG 112 08/31/2020   CHOLHDL 3.1 08/31/2020    Hepatic Function Latest Ref Rng & Units 01/12/2020 01/13/2019 01/01/2018  Total Protein 6.0 - 8.3 g/dL 6.0 5.9(L) 6.0  Albumin 3.5 - 5.2 g/dL 4.1 3.9 4.1  AST 0 - 37 U/L _0 ALT 0 - 53 U/L _1 Alk Phosphatase 39 - 117 U/L 93 78 68  Total Bilirubin 0.2 - 1.2 mg/dL 0.8 0.7 0.8  Bilirubin, Direct 0.0 - 0.3 mg/dL - - -    The 10-year ASCVD risk score Mikey Bussing DC Jr., et al., 2013) is: 45%   Values used to calculate the score:     Age: 35 years     Sex: Male     Is Non-Hispanic African American: No     Diabetic: Yes     Tobacco smoker: No     Systolic Blood Pressure: 235 mmHg     Is BP treated: Yes     HDL Cholesterol: 43 mg/dL     Total Cholesterol: 132 mg/dL   Patient has failed these meds in past: none Patient is currently controlled on the following medications:  . Simvastatin 20 mg - 1 tablet daily   We discussed: <5 day gap on refills   No update/changes 09/20/20  Plan: Continue current medications   ED   Patient has failed these meds in past: Sildenafil (price)  Patient is currently uncontrolled on the following medications:   Cialis 20 mg - 1/2 to 1 tablet PRN  Update 09/20/20:  He did not pick up Cialis due to price. Explained GoodRx coupon and price of $15 for 30 DS. Patient would like to fill this at [facility] and have it delivered. Coordinated delivery.   Plan: Continue current medications    Medication Management  Pharmacy/Benefits: [facility]  CCM Follow Up: 3 months telephone; CMA follow up in 2 weeks for BG log   Debbora Dus, PharmD Clinical Pharmacist [facility] at [facility] [phone]

## 2020-09-20 NOTE — Telephone Encounter (Signed)
Dr. Cruzita Lederer,  Discussed Ozempic with patient today 09/20/20 and he is very interested. We discussed cost - it is tier 3 with his insurance. He would like to check price at his pharmacy. If you agree, [facility]. He continues Tresiba 32-36 units at bedtime and Novolog 14 to 16 units before meals. Denies any hypoglycemia this month. BG still fluctuating --> 115-200 fasting, 150-500 post-prandial.  We also called [employer] to check on his Dexcom and they are waiting for updated insurance from him. We will provide updating insurance to them so it can be processed.  Thanks,  Debbora Dus, PharmD Clinical Pharmacist [facility] at [facility] [phone]

## 2020-09-21 ENCOUNTER — Other Ambulatory Visit: Payer: Self-pay | Admitting: Internal Medicine

## 2020-09-21 ENCOUNTER — Telehealth: Payer: Self-pay

## 2020-09-21 MED ORDER — OZEMPIC (0.25 OR 0.5 MG/DOSE) 2 MG/1.5ML ~~LOC~~ SOPN: 0.5000 mg | PEN_INJECTOR | SUBCUTANEOUS | 3 refills | Status: DC

## 2020-09-21 NOTE — Chronic Care Management (AMB) (Addendum)
Chronic Care Management Pharmacy Assistant   Name: Anthony [name]  MRN: [id number] DOB: Sep 24, 1945  Reason for Encounter: [facility]/ Request new card from [place]  PCP : [name], MD  Allergies:  No Known Allergies  Medications: Outpatient Encounter Medications as of 09/21/2020  Medication Sig   amLODipine (NORVASC) 5 MG tablet Take 1 tablet (5 mg total) by mouth daily.   b complex vitamins tablet Take 1 tablet by mouth daily.   Continuous Blood Gluc Transmit (DEXCOM G6 TRANSMITTER) MISC 1 Device by Does not apply route every 3 (three) months.   Dextrose, Diabetic Use, (GLUCOSE PO) Take 1 tablet by mouth as needed.   glucagon (GLUCAGON EMERGENCY) 1 MG injection Inject 1 mg into the muscle once as needed for up to 1 dose.   insulin aspart (NOVOLOG FLEXPEN) 100 UNIT/ML FlexPen Inject 10-18 Units into the skin 3 (three) times daily with meals. (Patient taking differently: Inject 12-14 Units into the skin 3 (three) times daily with meals.)   Insulin Pen Needle (ADVOCATE INSULIN PEN NEEDLES) 31G X 5 MM MISC Use 3x a day   metFORMIN (GLUCOPHAGE-XR) 500 MG 24 hr tablet TAKE 2 TABLETS (1,000 MG TOTAL) BY MOUTH DAILY. TAKE 2 TABLETS BY MOUTH TWICE DAILY WITH MEALS (Patient taking differently: 500 mg daily with breakfast.)   olmesartan-hydrochlorothiazide (BENICAR HCT) 40-25 MG tablet Take 1 tablet by mouth daily.   ONETOUCH ULTRA test strip USE AS INSTRUCTED 4X A DAY. DAY SUPPLY PER INSURANCE.   pantoprazole (PROTONIX) 40 MG tablet Take 1 tablet (40 mg total) by mouth daily.   Semaglutide,0.25 or 0.5MG /DOS, (OZEMPIC, 0.25 OR 0.5 MG/DOSE,) 2 MG/1.5ML SOPN Inject 0.5 mg into the skin once a week.   sildenafil (VIAGRA) 100 MG tablet TAKE 0.5-1 TABLETS (50-100 MG TOTAL) BY MOUTH DAILY AS NEEDED FOR ERECTILE DYSFUNCTION.   simvastatin (ZOCOR) 20 MG tablet TAKE 1 TABLET BY MOUTH EVERYDAY AT BEDTIME (Patient taking differently: Take 20 mg by mouth at bedtime. TAKE 1 TABLET BY  MOUTH EVERYDAY AT BEDTIME)   tadalafil (CIALIS) 20 MG tablet Take 0.5-1 tablets (10-20 mg total) by mouth every other day as needed for erectile dysfunction.   traMADol (ULTRAM) 50 MG tablet Take 1 tablet (50 mg total) by mouth every 6 (six) hours as needed for moderate pain.   TRESIBA FLEXTOUCH 200 UNIT/ML FlexTouch Pen INJECT 32 UNITS INTO THE SKIN DAILY.   No facility-administered encounter medications on file as of 09/21/2020.    Current Diagnosis: Patient Active Problem List   Diagnosis Date Noted   Other hyperlipidemia 08/31/2020   COPD (chronic obstructive pulmonary disease) ([place]) 02/28/2020   Coronary artery calcification seen on CAT scan 02/28/2020   Aortic atherosclerosis ([place]) 02/28/2020   Ex-smoker 01/21/2020   RLQ abdominal pain 01/21/2020   Numbness and tingling in left arm 01/21/2020   Health maintenance examination 01/05/2017   Right knee pain 01/18/2016   Barrett's esophagus 12/08/2015   Abnormal tympanic membrane of right ear 07/05/2015   Advanced care planning/counseling discussion 06/30/2014   Medicare annual wellness visit, subsequent 06/22/2013   Benign prostatic hyperplasia with urinary obstruction 06/18/2013   ED (erectile dysfunction) 04/10/2012   Hyperlipidemia associated with type 2 diabetes mellitus ([place]) 12/29/2008   LEG PAIN, BILATERAL 11/19/2008   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 He would like this delivered. Patient also wanted to check price on Ozempic and have it delivered from [facility]. [facility]ed [facility] for medication transfers on Cialis and Ozempic. Pharmacy agreed to fax medications to [facility].   Patient needed assistance contacting [place] to provide updated  insurance card information for [facility] approval. He was unable to find his 2022 card insurance card. He approved that I contact his insurance to obtain this information. [employer] also contacted on behalf of the patient to request new insurance card and obtain ID and group number. ID [id number] and Group [id number]. This information was then provided to [place] for patient to receive Dexcom CGM. [employer] states they will process information and then forward to dispensing company.   Patient is aware Ozempic and Cialis have been transferred and aware that [employer] will contact him whether he is approved or not. Encouraged him to contact us with any concerns or questions.  Follow-Up:  Care Coordination with Outside Provider, Coordination of Enhanced Pharmacy Services and Pharmacist Review   Debbora Dus, CPP notified  [name], [place] Assistant [phone]  I have reviewed the care management and care coordination activities outlined in this encounter and I am certifying that I agree with the content of this note. No further action required.  Debbora Dus, PharmD Clinical Pharmacist [facility] at [facility] [phone]

## 2020-09-21 NOTE — Telephone Encounter (Signed)
Delight Stare, Sure, I sent it. Can you please let him know to start at 0.25 mg weekly x 4 weeks and only increase to 0.5 mg weekly after this, if tolerates it well? Thank you, CG

## 2020-09-21 NOTE — Telephone Encounter (Signed)
Yes of course, thank you.

## 2020-09-23 NOTE — Patient Instructions (Signed)
Dear Anthony [name],  Below is a summary of the goals we discussed during our follow up appointment on September 23, 2020. Please contact me anytime with questions or concerns.   Visit Information  Goals Addressed            This Visit's Progress   . Pharmacy Care Plan       CARE PLAN ENTRY (see longitudinal plan of care for additional care plan information)  Current Barriers:  . Chronic Disease Management support, education, and care coordination needs related to Hyperlipidemia and Diabetes   Hyperlipidemia Lab Results  Component Value Date/Time   LDLCALC 69 08/31/2020 02:37 PM   LDLDIRECT 94.0 10/05/2016 05:28 PM   . Pharmacist Clinical Goal(s): o Over the next 4 weeks, patient will work with PharmD and providers to maintain LDL goal < 100 . Current regimen:  o Simvastatin 20 mg - 1 tablet daily  . Interventions: o Reviewed adherence, refills timely . Patient self care activities - Over the next 4 weeks, patient will: o Continue medication as prescribed . Slowly increase exercise with goal of 30 minutes, 5 days per week . Incorporate a healthy diet high in vegetables, fruits and whole grains with low-fat dairy products, chicken, fish, legumes, non-tropical vegetable oils and nuts. Limit intake of sweets, sugar-sweetened beverages and red meats.  Diabetes Lab Results  Component Value Date/Time   HGBA1C 7.5 (A) 07/27/2020 03:13 PM   HGBA1C 7.7 (H) 03/02/2020 10:26 AM   HGBA1C 7.8 (H) 01/12/2020 10:21 AM .  Pharmacist Clinical Goal(s): o Over the next 4 weeks, patient will work with PharmD and providers to achieve A1c goal <7% and prevent hypoglycemia  . Current regimen:   Metformin 500 mg - 1 tablet daily  Insulin aspart (NovoLog) - 12 units small meals, 16 regular meals, 18 large meal three times daily 5-10 minutes before meals   Tresiba - 32 units daily . Interventions: o Adjust medication timing to prevent hypoglycemia - take Novolog before meals  o Consult  with Dr. Cruzita Lederer to consider Ozempic o Coordinate new insurance information to [name]  . Patient self care activities - Over the next 4 weeks, patient will: o Check blood sugar 3-4 times daily, document, and provide at future appointments o Start Ozempic 0.25 mg weekly for 4 weeks, then increase to 0.5 mg weekly if tolerated o Contact provider with any episodes of hypoglycemia  Please see past updates related to this goal by clicking on the "Past Updates" button in the selected goal        The patient verbalized understanding of instructions, educational materials, and care plan provided today and declined offer to receive copy of patient instructions, educational materials, and care plan.   The pharmacy team will reach out to the patient again over the next 14 days.   Debbora Dus, PharmD Clinical Pharmacist [facility] at [facility] [phone]   Diabetes Mellitus and Nutrition, Adult When you have diabetes, or diabetes mellitus, it is very important to have healthy eating habits because your blood sugar (glucose) levels are greatly affected by what you eat and drink. Eating healthy foods in the right amounts, at about the same times every day, can help you:  Control your blood glucose.  Lower your risk of heart disease.  Improve your blood pressure.  Reach or maintain a healthy weight. What can affect my meal plan? Every person with diabetes is different, and each person has different needs for a meal plan. Your health  care provider may recommend that you work with a dietitian to make a meal plan that is best for you. Your meal plan may vary depending on factors such as:  The calories you need.  The medicines you take.  Your weight.  Your blood glucose, blood pressure, and cholesterol levels.  Your activity level.  Other health conditions you have, such as heart or kidney disease. How do carbohydrates affect me? Carbohydrates, also called carbs,  affect your blood glucose level more than any other type of food. Eating carbs naturally raises the amount of glucose in your blood. Carb counting is a method for keeping track of how many carbs you eat. Counting carbs is important to keep your blood glucose at a healthy level, especially if you use insulin or take certain oral diabetes medicines. It is important to know how many carbs you can safely have in each meal. This is different for every person. Your dietitian can help you calculate how many carbs you should have at each meal and for each snack. How does alcohol affect me? Alcohol can cause a sudden decrease in blood glucose (hypoglycemia), especially if you use insulin or take certain oral diabetes medicines. Hypoglycemia can be a life-threatening condition. Symptoms of hypoglycemia, such as sleepiness, dizziness, and confusion, are similar to symptoms of having too much alcohol.  Do not drink alcohol if: ? Your health care provider tells you not to drink. ? You are pregnant, may be pregnant, or are planning to become pregnant.  If you drink alcohol: ? Do not drink on an empty stomach. ? Limit how much you use to:  0-1 drink a day for women.  0-2 drinks a day for men. ? Be aware of how much alcohol is in your drink. In the [place] one drink equals one 12 oz bottle of beer (355 mL), one 5 oz glass of wine (148 mL), or one 1 oz glass of hard liquor (44 mL). ? Keep yourself hydrated with water, diet soda, or unsweetened iced tea.  Keep in mind that regular soda, juice, and other mixers may contain a lot of sugar and must be counted as carbs. What are tips for following this plan? Reading food labels  Start by checking the serving size on the "Nutrition Facts" label of packaged foods and drinks. The amount of calories, carbs, fats, and other nutrients listed on the label is based on one serving of the item. Many items contain more than one serving per package.  Check the total grams (g)  of carbs in one serving. You can calculate the number of servings of carbs in one serving by dividing the total carbs by 15. For example, if a food has 30 g of total carbs per serving, it would be equal to 2 servings of carbs.  Check the number of grams (g) of saturated fats and trans fats in one serving. Choose foods that have a low amount or none of these fats.  Check the number of milligrams (mg) of salt (sodium) in one serving. Most people should limit total sodium intake to less than 2,300 mg per day.  Always check the nutrition information of foods labeled as "low-fat" or "nonfat." These foods may be higher in added sugar or refined carbs and should be avoided.  Talk to your dietitian to identify your daily goals for nutrients listed on the label. Shopping  Avoid buying canned, pre-made, or processed foods. These foods tend to be high in fat, sodium, and added sugar.  Shop around the outside edge of the grocery store. This is where you will most often find fresh fruits and vegetables, bulk grains, fresh meats, and fresh dairy. Cooking  Use low-heat cooking methods, such as baking, instead of high-heat cooking methods like deep frying.  Cook using healthy oils, such as olive, canola, or sunflower oil.  Avoid cooking with butter, cream, or high-fat meats. Meal planning  Eat meals and snacks regularly, preferably at the same times every day. Avoid going long periods of time without eating.  Eat foods that are high in fiber, such as fresh fruits, vegetables, beans, and whole grains. Talk with your dietitian about how many servings of carbs you can eat at each meal.  Eat 4-6 oz (112-168 g) of lean protein each day, such as lean meat, chicken, fish, eggs, or tofu. One ounce (oz) of lean protein is equal to: ? 1 oz (28 g) of meat, chicken, or fish. ? 1 egg. ?  cup (62 g) of tofu.  Eat some foods each day that contain healthy fats, such as avocado, nuts, seeds, and fish.   What foods  should I eat? Fruits Berries. Apples. Oranges. Peaches. Apricots. Plums. Grapes. Mango. Papaya. Pomegranate. Kiwi. Cherries. Vegetables Lettuce. Spinach. Leafy greens, including kale, chard, collard greens, and mustard greens. Beets. Cauliflower. Cabbage. Broccoli. Carrots. Green beans. Tomatoes. Peppers. Onions. Cucumbers. Brussels sprouts. Grains Whole grains, such as whole-wheat or whole-grain bread, crackers, tortillas, cereal, and pasta. Unsweetened oatmeal. Quinoa. Brown or wild rice. Meats and other proteins Seafood. Poultry without skin. Lean cuts of poultry and beef. Tofu. Nuts. Seeds. Dairy Low-fat or fat-free dairy products such as milk, yogurt, and cheese. The items listed above may not be a complete list of foods and beverages you can eat. Contact a dietitian for more information. What foods should I avoid? Fruits Fruits canned with syrup. Vegetables Canned vegetables. Frozen vegetables with butter or cream sauce. Grains Refined white flour and flour products such as bread, pasta, snack foods, and cereals. Avoid all processed foods. Meats and other proteins Fatty cuts of meat. Poultry with skin. Breaded or fried meats. Processed meat. Avoid saturated fats. Dairy Full-fat yogurt, cheese, or milk. Beverages Sweetened drinks, such as soda or iced tea. The items listed above may not be a complete list of foods and beverages you should avoid. Contact a dietitian for more information. Questions to ask a health care provider  Do I need to meet with a diabetes educator?  Do I need to meet with a dietitian?  What number can I call if I have questions?  When are the best times to check my blood glucose? Where to find more information:  American Diabetes Association: diabetes.org  Academy of Nutrition and Dietetics: www.eatright.[employer] of Diabetes and Digestive and Kidney Diseases: [employer]  Association of Diabetes Care and Education Specialists:  www.diabeteseducator.org Summary  It is important to have healthy eating habits because your blood sugar (glucose) levels are greatly affected by what you eat and drink.  A healthy meal plan will help you control your blood glucose and maintain a healthy lifestyle.  Your health care provider may recommend that you work with a dietitian to make a meal plan that is best for you.  Keep in mind that carbohydrates (carbs) and alcohol have immediate effects on your blood glucose levels. It is important to count carbs and to use alcohol carefully. This information is not intended to replace advice given to you by your health care provider. Make  sure you discuss any questions you have with your health care provider. Document Revised: 07/22/2019 Document Reviewed: 07/22/2019 Elsevier Patient Education  2021 [employer].

## 2020-09-24 ENCOUNTER — Telehealth: Payer: Self-pay

## 2020-09-24 NOTE — Progress Notes (Signed)
I have collaborated with the care management provider regarding care management and care coordination activities outlined in this encounter and have reviewed this encounter including documentation in the note and care plan. I am certifying that I agree with the content of this note and encounter as supervising physician.  

## 2020-09-24 NOTE — Chronic Care Management (AMB) (Addendum)
Chronic Care Management Pharmacy Assistant   Name: Anthony [name]  MRN: [id number] DOB: 1946/08/21  Reason for Encounter: Medication Review- Ozempic    PCP : [name], MD  Allergies:  No Known Allergies  Medications: Outpatient Encounter Medications as of 09/24/2020  Medication Sig   amLODipine (NORVASC) 5 MG tablet Take 1 tablet (5 mg total) by mouth daily.   b complex vitamins tablet Take 1 tablet by mouth daily.   Continuous Blood Gluc Transmit (DEXCOM G6 TRANSMITTER) MISC 1 Device by Does not apply route every 3 (three) months.   Dextrose, Diabetic Use, (GLUCOSE PO) Take 1 tablet by mouth as needed.   glucagon (GLUCAGON EMERGENCY) 1 MG injection Inject 1 mg into the muscle once as needed for up to 1 dose.   insulin aspart (NOVOLOG FLEXPEN) 100 UNIT/ML FlexPen Inject 10-18 Units into the skin 3 (three) times daily with meals. (Patient taking differently: Inject 12-14 Units into the skin 3 (three) times daily with meals.)   Insulin Pen Needle (ADVOCATE INSULIN PEN NEEDLES) 31G X 5 MM MISC Use 3x a day   metFORMIN (GLUCOPHAGE-XR) 500 MG 24 hr tablet TAKE 2 TABLETS (1,000 MG TOTAL) BY MOUTH DAILY. TAKE 2 TABLETS BY MOUTH TWICE DAILY WITH MEALS (Patient taking differently: 500 mg daily with breakfast.)   olmesartan-hydrochlorothiazide (BENICAR HCT) 40-25 MG tablet Take 1 tablet by mouth daily.   ONETOUCH ULTRA test strip USE AS INSTRUCTED 4X A DAY. DAY SUPPLY PER INSURANCE.   pantoprazole (PROTONIX) 40 MG tablet Take 1 tablet (40 mg total) by mouth daily.   Semaglutide,0.25 or 0.5MG /DOS, (OZEMPIC, 0.25 OR 0.5 MG/DOSE,) 2 MG/1.5ML SOPN Inject 0.5 mg into the skin once a week.   sildenafil (VIAGRA) 100 MG tablet TAKE 0.5-1 TABLETS (50-100 MG TOTAL) BY MOUTH DAILY AS NEEDED FOR ERECTILE DYSFUNCTION.   simvastatin (ZOCOR) 20 MG tablet TAKE 1 TABLET BY MOUTH EVERYDAY AT BEDTIME (Patient taking differently: Take 20 mg by mouth at bedtime. TAKE 1 TABLET BY MOUTH EVERYDAY AT  BEDTIME)   tadalafil (CIALIS) 20 MG tablet Take 0.5-1 tablets (10-20 mg total) by mouth every other day as needed for erectile dysfunction.   traMADol (ULTRAM) 50 MG tablet Take 1 tablet (50 mg total) by mouth every 6 (six) hours as needed for moderate pain.   TRESIBA FLEXTOUCH 200 UNIT/ML FlexTouch Pen INJECT 32 UNITS INTO THE SKIN DAILY.   No facility-administered encounter medications on file as of 09/24/2020.    Current Diagnosis: Patient Active Problem List   Diagnosis Date Noted   Other hyperlipidemia 08/31/2020   COPD (chronic obstructive pulmonary disease) ([place]) 02/28/2020   Coronary artery calcification seen on CAT scan 02/28/2020   Aortic atherosclerosis ([place]) 02/28/2020   Ex-smoker 01/21/2020   RLQ abdominal pain 01/21/2020   Numbness and tingling in left arm 01/21/2020   Health maintenance examination 01/05/2017   Right knee pain 01/18/2016   Barrett's esophagus 12/08/2015   Abnormal tympanic membrane of right ear 07/05/2015   Advanced care planning/counseling discussion 06/30/2014   Medicare annual wellness visit, subsequent 06/22/2013   Benign prostatic hyperplasia with urinary obstruction 06/18/2013   ED (erectile dysfunction) 04/10/2012   Hyperlipidemia associated with type 2 diabetes mellitus ([place]) 12/29/2008   LEG PAIN, BILATERAL 11/19/2008   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 ([place]) 07/24/2007   ANXIETY 07/24/2007   Diabetes mellitus with coincident hypertension ([place]) 07/24/2007   GERD 07/24/2007   Patient called regarding the message left for him yesterday regarding the Ozempic.  Ozempic started per Dr. Cruzita Lederer. Patient aware medication should be delivered from [facility] today. He was also notified to start Ozempic at 0.25 mg for the first 4 weeks and that CMA would follow up on 10/08/20 to review blood sugar readings. Patient aware to call with any questions or concerns. He denies any questions for the pharmacist at this  time.  Follow-Up:  Pharmacist Review   Debbora Dus, CPP notified  [name], [place] Assistant [phone]  I have reviewed the care management and care coordination activities outlined in this encounter and I am certifying that I agree with the content of this note. No further action required.  Debbora Dus, PharmD Clinical Pharmacist [facility] at [facility] [phone]

## 2020-09-27 ENCOUNTER — Telehealth: Payer: Self-pay

## 2020-09-27 NOTE — Chronic Care Management (AMB) (Signed)
Chronic Care Management Pharmacy Assistant   Name: OBINNA EHRESMAN  MRN: [id number] DOB: 25-May-1946  Reason for Encounter: Medication Review  PCP : [name], MD  Allergies:  No Known Allergies  Medications: Outpatient Encounter Medications as of 09/27/2020  Medication Sig  . amLODipine (NORVASC) 5 MG tablet Take 1 tablet (5 mg total) by mouth daily.  [name] Kitchen b complex vitamins tablet Take 1 tablet by mouth daily.  . Continuous Blood Gluc Transmit (DEXCOM G6 TRANSMITTER) MISC 1 Device by Does not apply route every 3 (three) months.  . Dextrose, Diabetic Use, (GLUCOSE PO) Take 1 tablet by mouth as needed.  [name] Kitchen glucagon (GLUCAGON EMERGENCY) 1 MG injection Inject 1 mg into the muscle once as needed for up to 1 dose.  . insulin aspart (NOVOLOG FLEXPEN) 100 UNIT/ML FlexPen Inject 10-18 Units into the skin 3 (three) times daily with meals. (Patient taking differently: Inject 12-14 Units into the skin 3 (three) times daily with meals.)  . Insulin Pen Needle (ADVOCATE INSULIN PEN NEEDLES) 31G X 5 MM MISC Use 3x a day  . metFORMIN (GLUCOPHAGE-XR) 500 MG 24 hr tablet TAKE 2 TABLETS (1,000 MG TOTAL) BY MOUTH DAILY. TAKE 2 TABLETS BY MOUTH TWICE DAILY WITH MEALS (Patient taking differently: 500 mg daily with breakfast.)  . olmesartan-hydrochlorothiazide (BENICAR HCT) 40-25 MG tablet Take 1 tablet by mouth daily.  Glory Rosebush ULTRA test strip USE AS INSTRUCTED 4X A DAY. DAY SUPPLY PER INSURANCE.  . pantoprazole (PROTONIX) 40 MG tablet Take 1 tablet (40 mg total) by mouth daily.  . Semaglutide,0.25 or 0.5MG /DOS, (OZEMPIC, 0.25 OR 0.5 MG/DOSE,) 2 MG/1.5ML SOPN Inject 0.5 mg into the skin once a week.  . sildenafil (VIAGRA) 100 MG tablet TAKE 0.5-1 TABLETS (50-100 MG TOTAL) BY MOUTH DAILY AS NEEDED FOR ERECTILE DYSFUNCTION.  . simvastatin (ZOCOR) 20 MG tablet TAKE 1 TABLET BY MOUTH EVERYDAY AT BEDTIME (Patient taking differently: Take 20 mg by mouth at bedtime. TAKE 1 TABLET BY MOUTH EVERYDAY AT  BEDTIME)  . tadalafil (CIALIS) 20 MG tablet Take 0.5-1 tablets (10-20 mg total) by mouth every other day as needed for erectile dysfunction.  . traMADol (ULTRAM) 50 MG tablet Take 1 tablet (50 mg total) by mouth every 6 (six) hours as needed for moderate pain.  . TRESIBA FLEXTOUCH 200 UNIT/ML FlexTouch Pen INJECT 32 UNITS INTO THE SKIN DAILY.   No facility-administered encounter medications on file as of 09/27/2020.    Current Diagnosis: Patient Active Problem List   Diagnosis Date Noted  . Other hyperlipidemia 08/31/2020  . COPD (chronic obstructive pulmonary disease) ([place]) 02/28/2020  . Coronary artery calcification seen on CAT scan 02/28/2020  . Aortic atherosclerosis ([place]) 02/28/2020  . Ex-smoker 01/21/2020  . RLQ abdominal pain 01/21/2020  . Numbness and tingling in left arm 01/21/2020  . Health maintenance examination 01/05/2017  . Right knee pain 01/18/2016  . Barrett's esophagus 12/08/2015  . Abnormal tympanic membrane of right ear 07/05/2015  . Advanced care planning/counseling discussion 06/30/2014  . Medicare annual wellness visit, subsequent 06/22/2013  . Benign prostatic hyperplasia with urinary obstruction 06/18/2013  . ED (erectile dysfunction) 04/10/2012  . Hyperlipidemia associated with type 2 diabetes mellitus ([place]) 12/29/2008  . LEG PAIN, BILATERAL 11/19/2008  . 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 ([place]) 07/24/2007  . ANXIETY 07/24/2007  . Diabetes mellitus with coincident hypertension ([place]) 07/24/2007  . GERD 07/24/2007   Mr. Ingham called to verify instructions on his Ozempic that he is starting today. Informed  him per Debbora Dus, Pharm. D, he should start at 0.25 mg once a week for 4 weeks. He states his injection day will be Mondays. He is aware he should keep a log of his blood glucose readings and call with any questions or readings below 70.   Follow-Up:  Pharmacist Review   Debbora Dus, CPP  notified  [name], [place] Pharmacy Assistant [phone]

## 2020-09-28 ENCOUNTER — Telehealth: Payer: Self-pay

## 2020-09-28 NOTE — Chronic Care Management (AMB) (Addendum)
Chronic Care Management Pharmacy Assistant   Name: Anthony [name]  MRN: [id number] DOB: 1945/09/08  Reason for Encounter: Disease State - Blood glucose  PCP : [name], MD  Allergies:  No Known Allergies  Medications: Outpatient Encounter Medications as of 09/28/2020  Medication Sig   amLODipine (NORVASC) 5 MG tablet Take 1 tablet (5 mg total) by mouth daily.   b complex vitamins tablet Take 1 tablet by mouth daily.   Continuous Blood Gluc Transmit (DEXCOM G6 TRANSMITTER) MISC 1 Device by Does not apply route every 3 (three) months.   Dextrose, Diabetic Use, (GLUCOSE PO) Take 1 tablet by mouth as needed.   glucagon (GLUCAGON EMERGENCY) 1 MG injection Inject 1 mg into the muscle once as needed for up to 1 dose.   insulin aspart (NOVOLOG FLEXPEN) 100 UNIT/ML FlexPen Inject 10-18 Units into the skin 3 (three) times daily with meals. (Patient taking differently: Inject 12-14 Units into the skin 3 (three) times daily with meals.)   Insulin Pen Needle (ADVOCATE INSULIN PEN NEEDLES) 31G X 5 MM MISC Use 3x a day   metFORMIN (GLUCOPHAGE-XR) 500 MG 24 hr tablet TAKE 2 TABLETS (1,000 MG TOTAL) BY MOUTH DAILY. TAKE 2 TABLETS BY MOUTH TWICE DAILY WITH MEALS (Patient taking differently: 500 mg daily with breakfast.)   olmesartan-hydrochlorothiazide (BENICAR HCT) 40-25 MG tablet Take 1 tablet by mouth daily.   ONETOUCH ULTRA test strip USE AS INSTRUCTED 4X A DAY. DAY SUPPLY PER INSURANCE.   pantoprazole (PROTONIX) 40 MG tablet Take 1 tablet (40 mg total) by mouth daily.   Semaglutide,0.25 or 0.5MG /DOS, (OZEMPIC, 0.25 OR 0.5 MG/DOSE,) 2 MG/1.5ML SOPN Inject 0.5 mg into the skin once a week.   sildenafil (VIAGRA) 100 MG tablet TAKE 0.5-1 TABLETS (50-100 MG TOTAL) BY MOUTH DAILY AS NEEDED FOR ERECTILE DYSFUNCTION.   simvastatin (ZOCOR) 20 MG tablet TAKE 1 TABLET BY MOUTH EVERYDAY AT BEDTIME (Patient taking differently: Take 20 mg by mouth at bedtime. TAKE 1 TABLET BY MOUTH EVERYDAY AT  BEDTIME)   tadalafil (CIALIS) 20 MG tablet Take 0.5-1 tablets (10-20 mg total) by mouth every other day as needed for erectile dysfunction.   traMADol (ULTRAM) 50 MG tablet Take 1 tablet (50 mg total) by mouth every 6 (six) hours as needed for moderate pain.   TRESIBA FLEXTOUCH 200 UNIT/ML FlexTouch Pen INJECT 32 UNITS INTO THE SKIN DAILY.   No facility-administered encounter medications on file as of 09/28/2020.    Current Diagnosis: Patient Active Problem List   Diagnosis Date Noted   Other hyperlipidemia 08/31/2020   COPD (chronic obstructive pulmonary disease) ([place]) 02/28/2020   Coronary artery calcification seen on CAT scan 02/28/2020   Aortic atherosclerosis ([place]) 02/28/2020   Ex-smoker 01/21/2020   RLQ abdominal pain 01/21/2020   Numbness and tingling in left arm 01/21/2020   Health maintenance examination 01/05/2017   Right knee pain 01/18/2016   Barrett's esophagus 12/08/2015   Abnormal tympanic membrane of right ear 07/05/2015   Advanced care planning/counseling discussion 06/30/2014   Medicare annual wellness visit, subsequent 06/22/2013   Benign prostatic hyperplasia with urinary obstruction 06/18/2013   ED (erectile dysfunction) 04/10/2012   Hyperlipidemia associated with type 2 diabetes mellitus ([place]) 12/29/2008   LEG PAIN, BILATERAL 11/19/2008   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 Loll called this morning 09/28/20 to state that his blood sugar  last night was 470 after dinner, 400 at bedtime and 160 this morning. Denies any vision changes, irritability, feeling bad. Patient states that he ate "healthier" than usual last night. Denies being on steroids, being sick or having any infections. Patient does note that he did not take his metformin yesterday. Advised patient per Debbora Dus he should  continue taking metformin. Patient does state that his fasting sugar has improved as it is usually higher than 160. Patient advised to monitor blood glucose closely and call in the morning if he has any readings over 200.    Follow-Up:  Pharmacist Review  Debbora Dus, CPP notified  [name], [place] Assistant  I have reviewed the care management and care coordination activities outlined in this encounter and I am certifying that I agree with the content of this note. No further action required.  Debbora Dus, PharmD Clinical Pharmacist [facility] at [facility] [phone]

## 2020-10-02 IMAGING — CT CT CHEST LUNG CANCER SCREENING LOW DOSE W/O CM
1 of 2 series · 14 of 32 positions shown, 18 images · non-contrast
Comparison: No priors.

CLINICAL DATA: 73-year-old male former smoker (quit 6 years ago)
with 32 pack-year history of smoking. Lung cancer screening
examination.

EXAM:
CT CHEST WITHOUT CONTRAST LOW-DOSE FOR LUNG CANCER SCREENING
TECHNIQUE: Multidetector CT imaging of the chest was performed following the
standard protocol without IV contrast.

[Series 3: ldct screen lung · axial · 0.71mm/px · z∈[-31,+229]mm · 14 of 287 slices shown, 18 images]
[im 14/287  mediastinal]
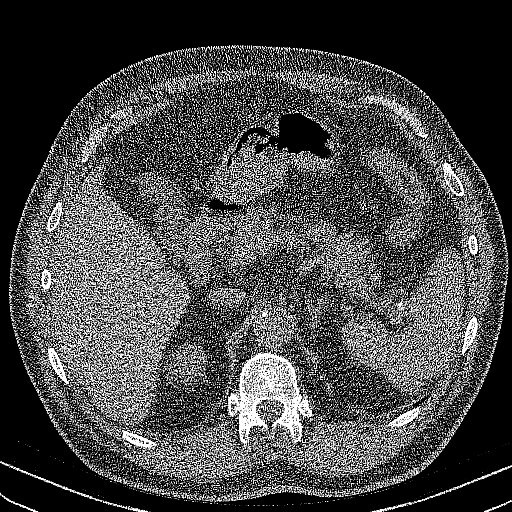
[im 14/287  lung]
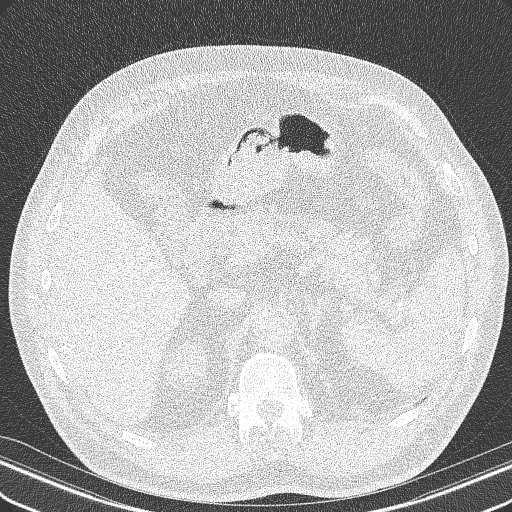
[im 40/287  lung]
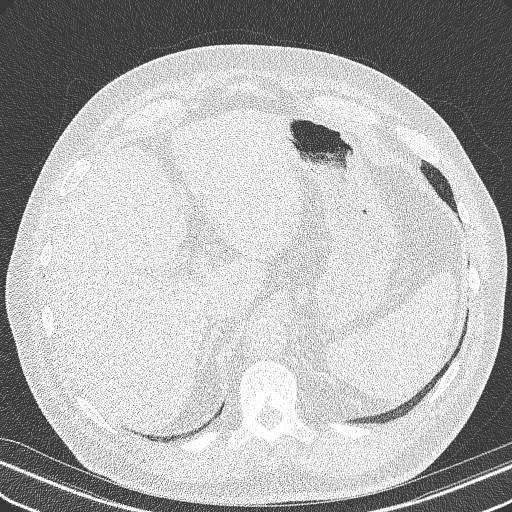
[im 66/287  lung]
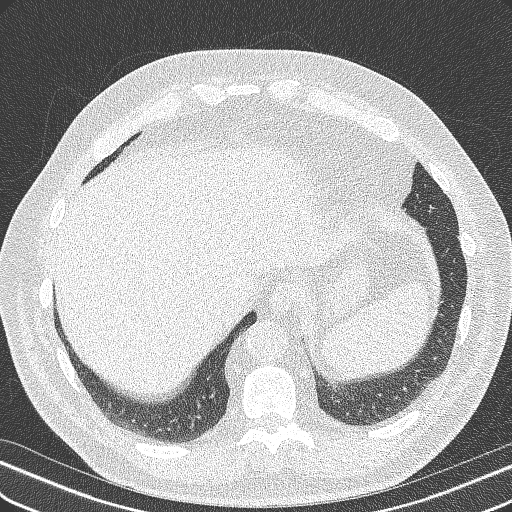
[im 79/287  lung]
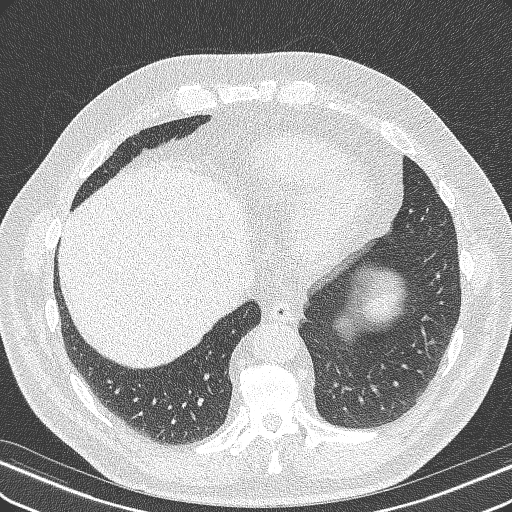
[im 92/287  mediastinal]
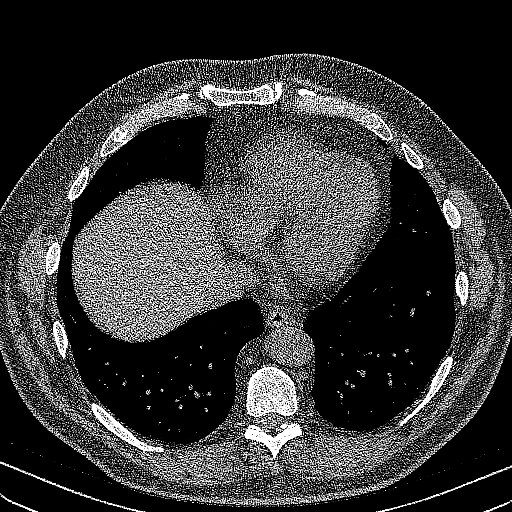
[im 92/287  lung]
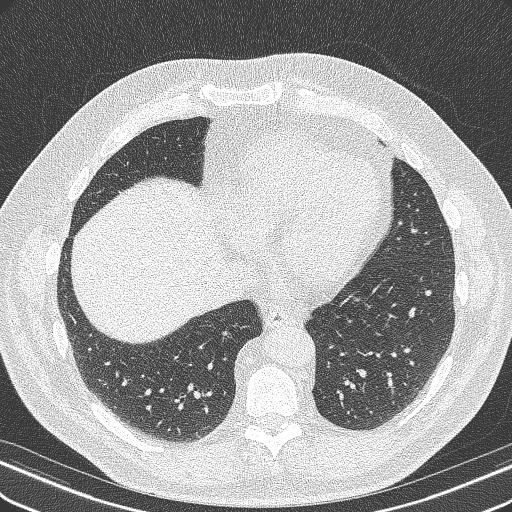
[im 118/287  lung]
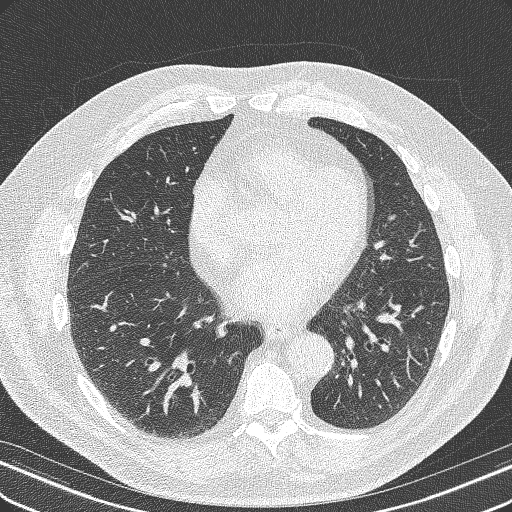
[im 132/287  lung]
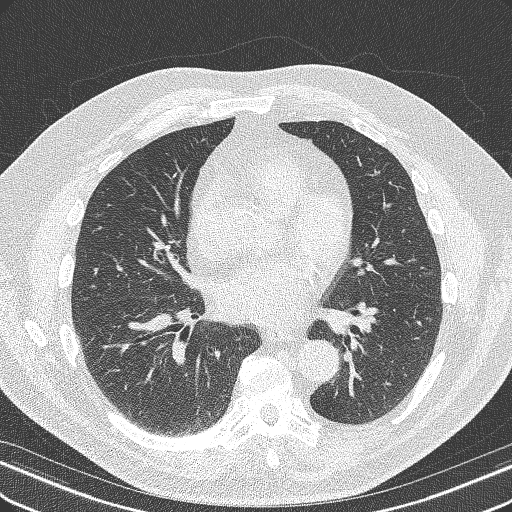
[im 144/287  lung]
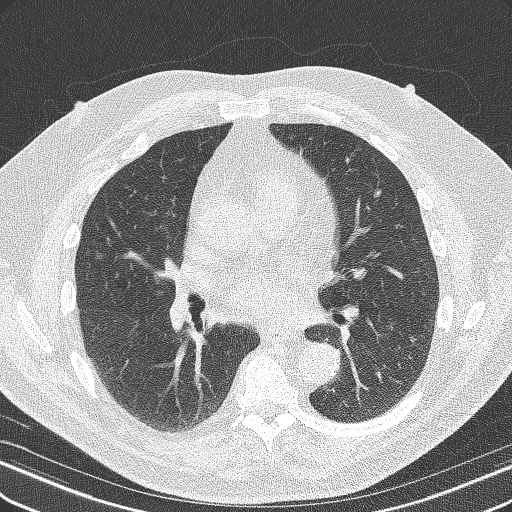
[im 170/287  mediastinal]
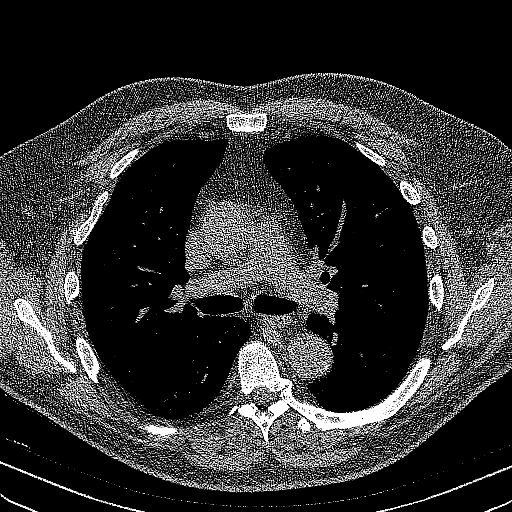
[im 170/287  lung]
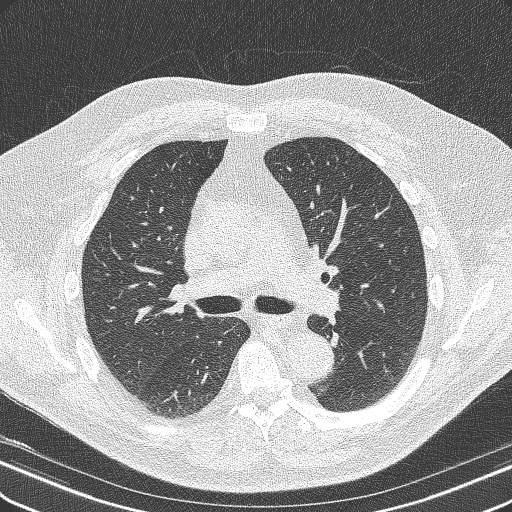
[im 196/287  lung]
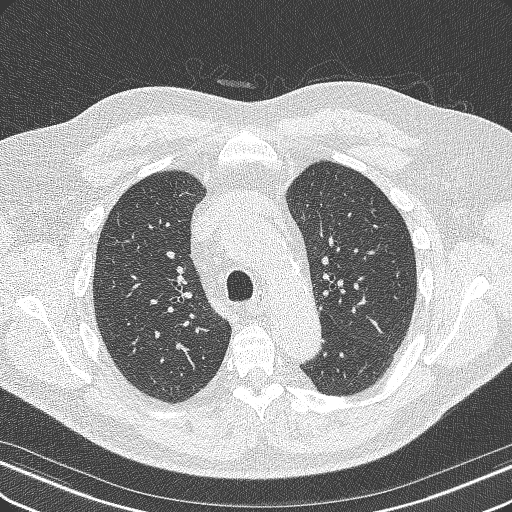
[im 215/287  lung]
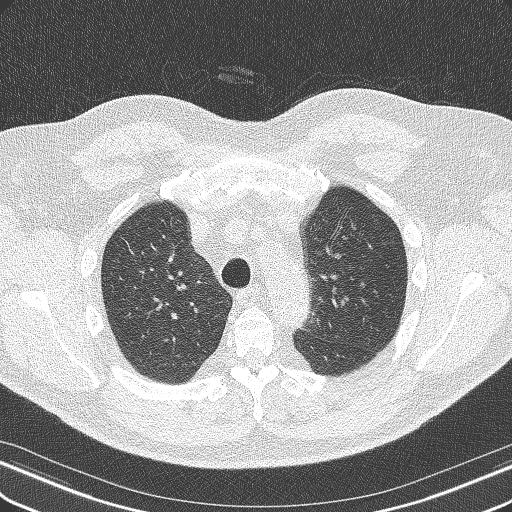
[im 222/287  lung]
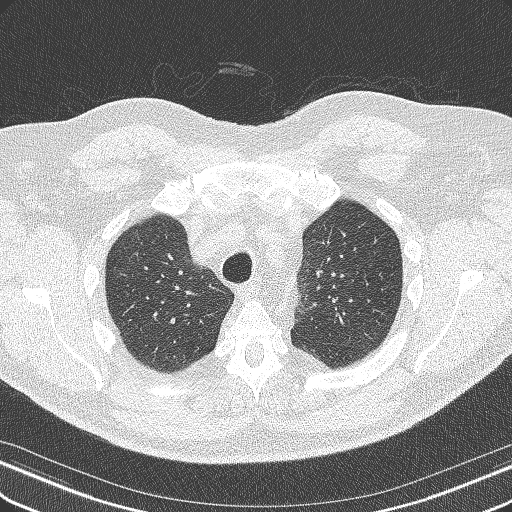
[im 248/287  mediastinal]
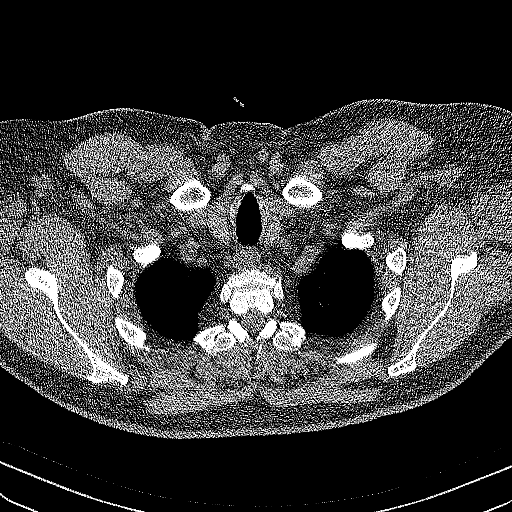
[im 248/287  lung]
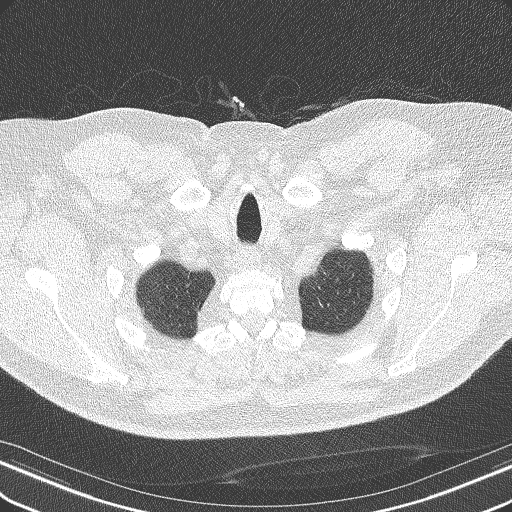
[im 274/287  lung]
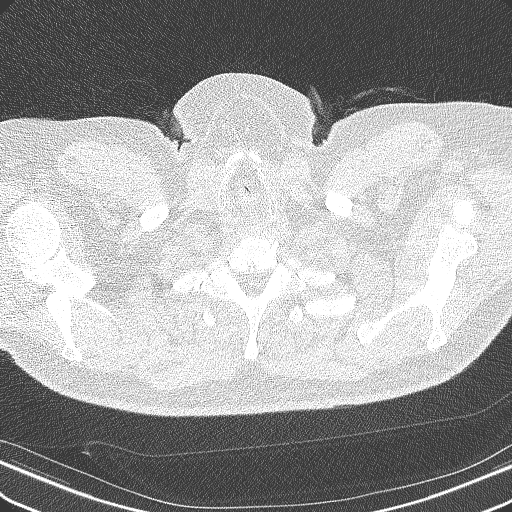

[14 of 32 positions shown; findings below may reference images not displayed]

FINDINGS: Cardiovascular: Heart size is normal. There is no significant
pericardial fluid, thickening or pericardial calcification. There is
aortic atherosclerosis, as well as atherosclerosis of the great
vessels of the mediastinum and the coronary arteries, including
calcified atherosclerotic plaque in the left main, left anterior
descending and left circumflex coronary arteries.

Mediastinum/Nodes: No pathologically enlarged mediastinal or hilar
lymph nodes. Please note that accurate exclusion of hilar adenopathy
is limited on noncontrast CT scans. Esophagus is unremarkable in
appearance. No axillary lymphadenopathy.

Lungs/Pleura: Small pulmonary nodules are noted in the lungs,
largest of which is in the posterior aspect of the right lower lobe
(axial image 188 of series 3), with a volume derived mean diameter
of 5.6 mm. No other larger more suspicious appearing pulmonary
nodules or masses are noted. No acute consolidative airspace
disease. No pleural effusions. Diffuse bronchial wall thickening
with mild centrilobular and paraseptal emphysema.

Upper Abdomen: Aortic atherosclerosis.  Status post cholecystectomy.

Musculoskeletal: There are no aggressive appearing lytic or blastic
lesions noted in the visualized portions of the skeleton.
IMPRESSION: 1. Lung-RADS 2S, benign appearance or behavior. Continue annual
screening with low-dose chest CT without contrast in 12 months.
2. The "S" modifier above refers to potentially clinically
significant non lung cancer related findings. Specifically, there is
aortic atherosclerosis, in addition to left main and 2 vessel
coronary artery disease. Assessment for potential risk factor
modification, dietary therapy or pharmacologic therapy may be
warranted, if clinically indicated.
3. Mild diffuse bronchial wall thickening with mild centrilobular
and paraseptal emphysema; imaging findings suggestive of underlying
COPD.

Aortic Atherosclerosis (F4DQK-X61.1) and Emphysema (F4DQK-B5X.T).

## 2020-10-04 ENCOUNTER — Telehealth: Payer: Self-pay

## 2020-10-04 NOTE — Chronic Care Management (AMB) (Addendum)
Per Anthony [name], patient was called back. He has not been taking [name] or Novolog since starting his Ozempic. He was informed to take his [name] today. Hold off on resuming the Novolog for today. Please call if any BG readings > 200. Patient verified understanding. He will continue to take one 500 mg metformin every morning and inject Ozempic 0.25 mg once weekly on Mondays. He will restart 32 units of [name] daily today.   Anthony [name], Anthony [name] [phone]  I have reviewed the care management and care coordination activities outlined in this encounter and I am certifying that I agree with the content of this note. No further action required.  Anthony [name], PharmD Clinical Pharmacist [facility] at [facility] [phone]

## 2020-10-04 NOTE — Chronic Care Management (AMB) (Addendum)
Chronic Care Management Pharmacy Assistant   Name: Anthony [name]  MRN: [id number] DOB: [birth year]  Reason for Encounter: Medication Inquiry - Ozempic  PCP : [name], MD  Allergies:  No Known Allergies  Medications: Outpatient Encounter Medications as of 10/04/2020  Medication Sig   amLODipine (NORVASC) 5 MG tablet Take 1 tablet (5 mg total) by mouth daily.   b complex vitamins tablet Take 1 tablet by mouth daily.   Continuous Blood Gluc Transmit (DEXCOM G6 TRANSMITTER) MISC 1 Device by Does not apply route every 3 (three) months.   Dextrose, Diabetic Use, (GLUCOSE PO) Take 1 tablet by mouth as needed.   glucagon (GLUCAGON EMERGENCY) 1 MG injection Inject 1 mg into the muscle once as needed for up to 1 dose.   insulin aspart (NOVOLOG FLEXPEN) 100 UNIT/ML FlexPen Inject 10-18 Units into the skin 3 (three) times daily with meals. (Patient taking differently: Inject 12-14 Units into the skin 3 (three) times daily with meals.)   Insulin Pen Needle (ADVOCATE INSULIN PEN NEEDLES) 31G X 5 MM MISC Use 3x a day   metFORMIN (GLUCOPHAGE-XR) 500 MG 24 hr tablet TAKE 2 TABLETS (1,000 MG TOTAL) BY MOUTH DAILY. TAKE 2 TABLETS BY MOUTH TWICE DAILY WITH MEALS (Patient taking differently: 500 mg daily with breakfast.)   olmesartan-hydrochlorothiazide (BENICAR HCT) 40-25 MG tablet Take 1 tablet by mouth daily.   ONETOUCH ULTRA test strip USE AS INSTRUCTED 4X A DAY. DAY SUPPLY PER INSURANCE.   pantoprazole (PROTONIX) 40 MG tablet Take 1 tablet (40 mg total) by mouth daily.   Semaglutide,0.25 or 0.5MG /DOS, (OZEMPIC, 0.25 OR 0.5 MG/DOSE,) 2 MG/1.5ML SOPN Inject 0.5 mg into the skin once a week.   sildenafil (VIAGRA) 100 MG tablet TAKE 0.5-1 TABLETS (50-100 MG TOTAL) BY MOUTH DAILY AS NEEDED FOR ERECTILE DYSFUNCTION.   simvastatin (ZOCOR) 20 MG tablet TAKE 1 TABLET BY MOUTH EVERYDAY AT BEDTIME (Patient taking differently: Take 20 mg by mouth at bedtime. TAKE 1 TABLET BY MOUTH EVERYDAY AT BEDTIME)    tadalafil (CIALIS) 20 MG tablet Take 0.5-1 tablets (10-20 mg total) by mouth every other day as needed for erectile dysfunction.   traMADol (ULTRAM) 50 MG tablet Take 1 tablet (50 mg total) by mouth every 6 (six) hours as needed for moderate pain.   TRESIBA FLEXTOUCH 200 UNIT/ML FlexTouch Pen INJECT 32 UNITS INTO THE SKIN DAILY.   No facility-administered encounter medications on file as of 10/04/2020.    Current Diagnosis: Patient Active Problem List   Diagnosis Date Noted   Other hyperlipidemia 08/31/2020   COPD (chronic obstructive pulmonary disease) ([place]) 02/28/2020   Coronary artery calcification seen on CAT scan 02/28/2020   Aortic atherosclerosis ([place]) 02/28/2020   Ex-smoker 01/21/2020   RLQ abdominal pain 01/21/2020   Numbness and tingling in left arm 01/21/2020   Health maintenance examination 01/05/2017   Right knee pain 01/18/2016   Barrett's esophagus 12/08/2015   Abnormal tympanic membrane of right ear 07/05/2015   Advanced care planning/counseling discussion 06/30/2014   Medicare annual wellness visit, subsequent 06/22/2013   Benign prostatic hyperplasia with urinary obstruction 06/18/2013   ED (erectile dysfunction) 04/10/2012   Hyperlipidemia associated with type 2 diabetes mellitus ([place]) 12/29/2008   LEG PAIN, BILATERAL 11/19/2008   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 Returned patients call to inform him he should be taking 0.25 mg once a week. Patient noted blood sugars have been running very high. 10/03/20- 270 Fasting  339 after breakfast  329 before lunch  346 after lunch  429 before dinner  507 after dinner.   Discussed current readings with Debbora Dus, Pharm. D. Patient only taking one 500 mg  of metformin daily due to diarrhea. States he has not been using his Novolog. Set up follow up call for 10/11/20.  Follow-Up:  Pharmacist Review  Debbora Dus, CPP notified  [name], [place] [phone]  After review, asked Mendel Ryder to call patient and clarify what insulin he was taking. He was not taking either insulin [name] or Novolog) since starting Ozempic. Requested Ria Comment call back to ensure he starts his [name] today. Hold off on resuming Novolog. Call if any BG > 200.  Debbora Dus, PharmD Clinical Pharmacist [facility] at [facility] [phone]

## 2020-10-06 NOTE — Chronic Care Management (AMB) (Addendum)
Mr. Solorio called this afternoon to discuss his BG readings.   10/04/20 - 256 - Fasting 10/05/20- 155 - 2 hours after dinner 10/06/20- 296 Fasting - States he took 14 units of Novolog after this reading.   239- 2 hours after breakfast.  States he has been feeling bad and tired the last few days and wonders if it is related to his blood sugar. Patient notes he needs a refill on test strips as he is testing 4-6 times a day now. Also inquired about Dexcom again. Advised message would be relayed and I would follow up with him.  Follow-Up:  Pharmacist Review  Debbora Dus, CPP notified  [name], [place] [phone]  Attempted to reach patient to discuss medications and BG log on 10/06/20. Unable to reach patient on telephone. Instructed Ria Comment to make sure patient is taking 32 units [name] daily. Ozempic 0.25 mg weekly. Metformin 500 mg daily. Once he is stable on this, will resume Novolog 10-14 units before meals if needed.   [place] to check on status of Dexcom. They report prescription was sent to [facility]. Contacted [facility] and they report progress notes and certificate of medical necessity needed from prescriber Cruzita Lederer) and assignment of benefits form  needed from patient.  Copay for patient would be 20% of total cost. Initial cost would be $94.90 for reader, transmitter and sensors. Monthly cost would be $47 per month for sensors. Inquired about fax # for these forms, but employee assigned to his case is at lunch. Contact is Junie Panning [phone]) and she will give me a call back once she returns from lunch.  Debbora Dus, PharmD Clinical Pharmacist [facility] at [facility] [phone]

## 2020-10-07 ENCOUNTER — Telehealth: Payer: Self-pay

## 2020-10-07 DIAGNOSIS — Z794 Long term (current) use of insulin: Secondary | ICD-10-CM

## 2020-10-07 DIAGNOSIS — E113299 Type 2 diabetes mellitus with mild nonproliferative diabetic retinopathy without macular edema, unspecified eye: Secondary | ICD-10-CM

## 2020-10-07 NOTE — Telephone Encounter (Signed)
Sharyn Lull, Can you please forward refill messages to my nurse, Tileshia? Thank you, CG

## 2020-10-07 NOTE — Chronic Care Management (AMB) (Addendum)
Debbora Dus attempted to reach patient on 10/06/20 to discuss high BG readings. Patient was unavailable.  Patient called back this morning 2/10 to state that his BG was 255 fasting when he got up this morning. He states that he thinks he took 24 units of Tresiba last night. Explained to patient, per Debbora Dus, he should take the full 32 units of Tresiba. Patient verbalized understanding. Will call if sugars drop below 70.  Follow-Up:  Pharmacist Review  Debbora Dus, CPP notified  [name], [place] Pharmacy Assistant [phone]

## 2020-10-07 NOTE — Telephone Encounter (Signed)
Patient is still waiting on approval of Dexcom and is requesting a refill to [facility] for his test trips.  Debbora Dus, PharmD Clinical Pharmacist [facility] at [facility] [phone]

## 2020-10-08 MED ORDER — ONETOUCH ULTRA VI STRP: ORAL_STRIP | 1 refills | Status: DC

## 2020-10-08 NOTE — Telephone Encounter (Signed)
Rx sent to pharmacy. Still working on [employer] for [employer]

## 2020-10-11 ENCOUNTER — Telehealth: Payer: Self-pay

## 2020-10-11 NOTE — Chronic Care Management (AMB) (Addendum)
Chronic Care Management Pharmacy Assistant   Name: Anthony [name]  MRN: [id number] DOB: [birth year]  Reason for Encounter: Disease State- Blood glucose log update  PCP : [name], MD  Allergies:  No Known Allergies  Medications: Outpatient Encounter Medications as of 10/11/2020  Medication Sig   amLODipine (NORVASC) 5 MG tablet Take 1 tablet (5 mg total) by mouth daily.   b complex vitamins tablet Take 1 tablet by mouth daily.   Continuous Blood Gluc Transmit (DEXCOM G6 TRANSMITTER) MISC 1 Device by Does not apply route every 3 (three) months.   Dextrose, Diabetic Use, (GLUCOSE PO) Take 1 tablet by mouth as needed.   glucagon (GLUCAGON EMERGENCY) 1 MG injection Inject 1 mg into the muscle once as needed for up to 1 dose.   glucose blood (ONETOUCH ULTRA) test strip USE AS INSTRUCTED 4X A DAY. DAY SUPPLY PER INSURANCE.   insulin aspart (NOVOLOG FLEXPEN) 100 UNIT/ML FlexPen Inject 10-18 Units into the skin 3 (three) times daily with meals. (Patient taking differently: Inject 12-14 Units into the skin 3 (three) times daily with meals.)   Insulin Pen Needle (ADVOCATE INSULIN PEN NEEDLES) 31G X 5 MM MISC Use 3x a day   metFORMIN (GLUCOPHAGE-XR) 500 MG 24 hr tablet TAKE 2 TABLETS (1,000 MG TOTAL) BY MOUTH DAILY. TAKE 2 TABLETS BY MOUTH TWICE DAILY WITH MEALS (Patient taking differently: 500 mg daily with breakfast.)   olmesartan-hydrochlorothiazide (BENICAR HCT) 40-25 MG tablet Take 1 tablet by mouth daily.   pantoprazole (PROTONIX) 40 MG tablet Take 1 tablet (40 mg total) by mouth daily.   Semaglutide,0.25 or 0.5MG /DOS, (OZEMPIC, 0.25 OR 0.5 MG/DOSE,) 2 MG/1.5ML SOPN Inject 0.5 mg into the skin once a week.   sildenafil (VIAGRA) 100 MG tablet TAKE 0.5-1 TABLETS (50-100 MG TOTAL) BY MOUTH DAILY AS NEEDED FOR ERECTILE DYSFUNCTION.   simvastatin (ZOCOR) 20 MG tablet TAKE 1 TABLET BY MOUTH EVERYDAY AT BEDTIME (Patient taking differently: Take 20 mg by mouth at bedtime. TAKE 1 TABLET  BY MOUTH EVERYDAY AT BEDTIME)   tadalafil (CIALIS) 20 MG tablet Take 0.5-1 tablets (10-20 mg total) by mouth every other day as needed for erectile dysfunction.   traMADol (ULTRAM) 50 MG tablet Take 1 tablet (50 mg total) by mouth every 6 (six) hours as needed for moderate pain.   TRESIBA FLEXTOUCH 200 UNIT/ML FlexTouch Pen INJECT 32 UNITS INTO THE SKIN DAILY.   No facility-administered encounter medications on file as of 10/11/2020.    Current Diagnosis: Patient Active Problem List   Diagnosis Date Noted   Other hyperlipidemia 08/31/2020   COPD (chronic obstructive pulmonary disease) ([place]) 02/28/2020   Coronary artery calcification seen on CAT scan 02/28/2020   Aortic atherosclerosis ([place]) 02/28/2020   Ex-smoker 01/21/2020   RLQ abdominal pain 01/21/2020   Numbness and tingling in left arm 01/21/2020   Health maintenance examination 01/05/2017   Right knee pain 01/18/2016   Barrett's esophagus 12/08/2015   Abnormal tympanic membrane of right ear 07/05/2015   Advanced care planning/counseling discussion 06/30/2014   Medicare annual wellness visit, subsequent 06/22/2013   Benign prostatic hyperplasia with urinary obstruction 06/18/2013   ED (erectile dysfunction) 04/10/2012   Hyperlipidemia associated with type 2 diabetes mellitus ([place]) 12/29/2008   LEG PAIN, BILATERAL 11/19/2008   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 ([place]) 07/24/2007   ANXIETY 07/24/2007   Diabetes mellitus with coincident hypertension ([place]) 07/24/2007   GERD 07/24/2007    Recent Relevant Labs: Lab Results  Component  Value Date/Time   HGBA1C 7.5 (A) 07/27/2020 03:13 PM   HGBA1C 7.7 (H) 03/02/2020 10:26 AM   HGBA1C 7.8 (H) 01/12/2020 10:21 AM   MICROALBUR 6.3 (H) 06/28/2015 09:39 AM   MICROALBUR 4.4 (H) 06/26/2014 09:05 AM    Kidney Function Lab Results  Component Value Date/Time   CREATININE 1.02 03/02/2020 10:26 AM   CREATININE 0.91 01/12/2020 10:21 AM   GFR  81.99 01/12/2020 10:21 AM   GFRNONAA >60 03/02/2020 10:26 AM   GFRAA >60 03/02/2020 10:26 AM   Contacted patient to review BG log.   Current antihyperglycemic regimen:  Metformin 500 mg - 1 tablet daily Insulin aspart (NovoLog) - 12 units small meals, 16 regular meals, 18 large meal three times daily 5-10 minutes before meals  Tresiba - 32 units daily- at night Ozempic 0.25 mg once weekly   How often are you checking your blood sugar? 3-4 times daily  What are your blood sugars ranging?   Fasting:  10/11/20- 237 10/10/20- 352 Before meals:  10/10/20-94 10/10/20-before dinner 400 After meals:  10/10/20- 237, 93 Bedtime: N/A  On insulin? Yes How many units: Patient is confused about insulin dosing. Reports he took 24 units Tresiba last night and 8 units Novolog at 8 PM   Follow-Up:  Pharmacist Review  Debbora Dus, CPP notified  [name], Bath Assistant [phone]  I have reviewed the care management and care coordination activities outlined in this encounter and I am certifying that I agree with the content of this note. I have contacted patient to review insulin dosing. See CCM note from 10/15/20.   Debbora Dus, PharmD Clinical Pharmacist [facility] at [facility] [phone]

## 2020-10-12 DIAGNOSIS — D3132 Benign neoplasm of left choroid: Secondary | ICD-10-CM | POA: Diagnosis not present

## 2020-10-12 LAB — HM DIABETES EYE EXAM

## 2020-10-15 ENCOUNTER — Ambulatory Visit (INDEPENDENT_AMBULATORY_CARE_PROVIDER_SITE_OTHER): Payer: Medicare HMO

## 2020-10-15 DIAGNOSIS — E1169 Type 2 diabetes mellitus with other specified complication: Secondary | ICD-10-CM | POA: Diagnosis not present

## 2020-10-15 DIAGNOSIS — Z794 Long term (current) use of insulin: Secondary | ICD-10-CM | POA: Diagnosis not present

## 2020-10-15 DIAGNOSIS — E113299 Type 2 diabetes mellitus with mild nonproliferative diabetic retinopathy without macular edema, unspecified eye: Secondary | ICD-10-CM | POA: Diagnosis not present

## 2020-10-15 DIAGNOSIS — E785 Hyperlipidemia, unspecified: Secondary | ICD-10-CM | POA: Diagnosis not present

## 2020-10-15 NOTE — Patient Instructions (Addendum)
Dear Anthony [name],  Below is a summary of the goals we discussed during our follow up appointment on October 15, 2020. Please contact me anytime with questions or concerns.   Visit Information  Conditions to be addressed/monitored:  Diabetes   Current Barriers:  . Unable to achieve control of diabetes  . Does not adhere to prescribed medication regimen . History of hypoglycemia  Pharmacist Clinical Goal(s):  [name] Kitchen Over the next 30 days, patient will achieve adherence to monitoring guidelines and medication adherence to achieve therapeutic efficacy through collaboration with PharmD and provider.   Interventions: . 1:1 collaboration with [name], MD regarding development and update of comprehensive plan of care as evidenced by provider attestation and co-signature . Inter-disciplinary care team collaboration (see longitudinal plan of care) . Comprehensive medication review performed; medication list updated in electronic medical record  Diabetes (A1c goal <7%) -uncontrolled  -Current medications:  Metformin 500 mg - 1 tablet daily  Insulin aspart (NovoLog) - on HOLD  Tresiba - 24 units twice daily   Ozempic 0.25 mg - Inject once weekly on Mondays -Medications previously tried: none -Current home glucose readings - checking BG before and after meals and bedtime (6-7 times daily) - Working on [employer], waiting on patient to receive AOB and return to [place], then [employer] will be mailed to him. 10/14/20 - on 24 units Tresiba twice daily   Before lunch - 185 (fasting)  After lunch - 202  Bedtime - 207 10/15/20 - on 24 units Tresiba twice daily   Before lunch: 116 (fasting) -Denies hypoglycemic/hyperglycemic symptoms -Current meal patterns: three meals per day -Current exercise: minimal -Educated onPrevention and management of hypoglycemic episodes; -Recommended to continue current medication; Patient to call each morning to report BG log and insulin dosing  until regimen confirmed.   Patient Goals/Self-Care Activities . Over the next 30 days, patient will:  - take medications as prescribed check glucose 5-7 times daily, document, and provide at future appointments  Follow Up Plan: The care management team will reach out to the patient again over the next 14 days. Patient to call each morning with BG log for the next 3 days.  The patient verbalized understanding of instructions, educational materials, and care plan provided today and declined offer to receive copy of patient instructions, educational materials, and care plan.    Debbora Dus, PharmD Clinical Pharmacist [facility] at [facility] [phone]   Hypoglycemia Hypoglycemia is when the sugar (glucose) level in your blood is too low. Low blood sugar can happen to people who have diabetes and people who do not have diabetes. Low blood sugar can happen quickly, and it can be an emergency. What are the causes? This condition happens most often in people who have diabetes and may be caused by:  Diabetes medicine.  Not eating enough, or not eating often enough.  Doing more physical activity.  Drinking alcohol on an empty stomach. If you do not have diabetes, hypoglycemia may be caused by:  A tumor in the pancreas.  Not eating enough, or not eating for long periods at a time (fasting).  A very bad infection or illness.  Problems after having weight loss (bariatric) surgery.  Kidney failure or liver failure.  Certain medicines. What increases the risk? This condition is more likely to develop in people who:  Have diabetes and take medicines to lower their blood sugar.  Abuse alcohol.  Have a very bad illness. What are the signs or symptoms? Symptoms depend  on whether your low blood sugar is mild, moderate, or very low. Mild  Hunger.  Feeling worried or nervous (anxious).  Sweating and feeling clammy.  Feeling dizzy or light-headed.  Being  sleepy or having trouble sleeping.  Feeling like you may vomit (nauseous).  A fast heartbeat.  A headache.  Blurry vision.  Being irritable or grouchy.  Tingling or loss of feeling (numbness) around your mouth, lips, or tongue.  Trouble with moving (coordination). Moderate  Confusion and poor judgment.  Behavior changes.  Weakness.  Uneven heartbeats. Very low Very low blood sugar (severe hypoglycemia) is a medical emergency. It can cause:  Fainting.  Jerky movements that you cannot control (seizure).  Loss of consciousness (coma).  Death. How is this treated? Treating low blood sugar Low blood sugar is often treated by eating or drinking something sugary right away. The snack should contain 15 grams of a fast-acting carb (carbohydrate). Options include:  4 oz (120 mL) of fruit juice.  4-6 oz (120-150 mL) of regular soda (not diet soda).  8 oz (240 mL) of low-fat milk.  Several pieces of hard candy. Check food labels to find out how many to eat for 15 grams.  1 Tbsp (15 mL) of sugar or honey. Treating low blood sugar if you have diabetes If you can think clearly and swallow safely, follow the 15:15 rule:  Take 15 grams of a fast-acting carb. Talk with your doctor about how much you should take.  Always keep a source of fast-acting carb with you, such as: ? Sugar tablets (glucose pills). Take 4 pills. ? Several pieces of hard candy. Check food labels to see how many pieces to eat for 15 grams. ? 4 oz (120 mL) of fruit juice. ? 4-6 oz (120-150 mL) of regular (not diet) soda. ? 1 Tbsp (15 mL) of honey or sugar.  Check your blood sugar 15 minutes after you take the carb.  If your blood sugar is still at or below 70 mg/dL (3.9 mmol/L), take 15 grams of a carb again.  If your blood sugar does not go above 70 mg/dL (3.9 mmol/L) after 3 tries, get help right away.  After your blood sugar goes back to normal, eat a meal or a snack within 1 hour.   Treating  very low blood sugar If your blood sugar is at or below 54 mg/dL (3 mmol/L), you have very low blood sugar, or severe hypoglycemia. This is an emergency. Get medical help right away. If you have very low blood sugar and you cannot eat or drink, you will need to be given a hormone called glucagon. A family member or friend should learn how to check your blood sugar and how to give you glucagon. Ask your doctor if you need to have an emergency glucagon kit at home. Very low blood sugar may also need to be treated in a hospital. Follow these instructions at home: General instructions  Take over-the-counter and prescription medicines only as told by your doctor.  Stay aware of your blood sugar as told by your doctor.  If you drink alcohol: ? Limit how much you use to:  0-1 drink a day for nonpregnant women.  0-2 drinks a day for men. ? Be aware of how much alcohol is in your drink. In the [place] one drink equals one 12 oz bottle of beer (355 mL), one 5 oz glass of wine (148 mL), or one 1 oz glass of hard liquor (44 mL).  Keep  all follow-up visits as told by your doctor. This is important. If you have diabetes:  Always have a rapid-acting carb (15 grams) option with you to treat low blood sugar.  Follow your diabetes care plan as told by your doctor. Make sure you: ? Know the symptoms of low blood sugar. ? Check your blood sugar as often as told by your doctor. Always check it before and after exercise. ? Always check your blood sugar before you drive. ? Take your medicines as told. ? Follow your meal plan. ? Eat on time. Do not skip meals.  Share your diabetes care plan with: ? Your work or school. ? People you live with.  Carry a card or wear jewelry that says you have diabetes.   Contact a doctor if:  You have trouble keeping your blood sugar in your target range.  You have low blood sugar often. Get help right away if:  You still have symptoms after you eat or drink  something that contains 15 grams of fast-acting carb and you cannot get your blood sugar above 70 mg/dL by following the 15:15 rule.  Your blood sugar is at or below 54 mg/dL (3 mmol/L).  You have a seizure.  You faint. These symptoms may be an emergency. Do not wait to see if the symptoms will go away. Get medical help right away. Call your local emergency services (911 in the [place] Do not drive yourself to the hospital. Summary  Hypoglycemia happens when the level of sugar (glucose) in your blood is too low.  Low blood sugar can happen to people who have diabetes and people who do not have diabetes. Low blood sugar can happen quickly, and it can be an emergency.  Make sure you know the symptoms of low blood sugar and know how to treat it.  Always keep a source of sugar (fast-acting carb) with you to treat low blood sugar. This information is not intended to replace advice given to you by your health care provider. Make sure you discuss any questions you have with your health care provider. Document Revised: 07/09/2019 Document Reviewed: 07/09/2019 Elsevier Patient Education  2021 [employer].

## 2020-10-15 NOTE — Progress Notes (Deleted)
Chronic Care Management Pharmacy Note  10/15/2020 Name:  Anthony [name] MRN:  [id number] DOB:  May 18, 1946  Subjective: Anthony [name] is an 75 y.o. year old male who is a primary patient of [name], MD.  The CCM team was consulted for assistance with disease management and care coordination needs.    Engaged with patient by telephone for follow up visit in response to provider referral for pharmacy case management and/or care coordination services.   Consent to Services:  The patient was given information about Chronic Care Management services, agreed to services, and gave verbal consent prior to initiation of services.  Please see initial visit note for detailed documentation.   Patient Care Team: [name], MD as PCP - General (Family Medicine) [name], MD (Neurosurgery) [name], MD as Consulting Physician (General Surgery) [name], MD (Inactive) as Consulting Physician (Urology) Thelma Comp, [place] as Consulting Physician (Optometry) Debbora Dus, [facility] as Pharmacist (Pharmacist)  Office Visits:  07/27/20: Larene Beach called pt to discuss dexcom education, asked pt to download app and call to schedule appt with nurse for education  07/27/20: PCP/Gutierrez - brittle diabetes, schedule follow up with endo, Dexcom sample, self-tapering meds, unclear about Novolog dose   01/20/20: PCP/Gutierrez - suspect right inguinal hernia; numbness in left arm - suggest vitamin B12 trial for 1 month; LDL at goal on simvastatin; GERD on PPI; referral for cancer screening; HTN stable; DM uncontrolled. Encouraged endo f/u; BPH stable, chronic, s/p TURP   Consult Visit:   08/12/20: Endocrinology - take Novolog 15 min before meals, keep log of when BG is high (missed doses, etc), continue Tresiba 32 units daily, metformin ER 500 mg daily, Novolog sliding scale - 12 units small meals, 16 regular meals, 18 large meals   06/02/20: Podiatry - annual DM foot exam    02/25/20: Pulmonology - lung cancer screening program  Hospital visits: None in previous 6 months  Objective:  Lab Results  Component Value Date   CREATININE 1.02 03/02/2020   BUN 12 03/02/2020   GFR 81.99 01/12/2020   GFRNONAA >60 03/02/2020   GFRAA >60 03/02/2020   NA 139 03/02/2020   K 3.6 03/02/2020   CALCIUM 9.0 03/02/2020   CO2 29 03/02/2020    Lab Results  Component Value Date/Time   HGBA1C 7.5 (A) 07/27/2020 03:13 PM   HGBA1C 7.7 (H) 03/02/2020 10:26 AM   HGBA1C 7.8 (H) 01/12/2020 10:21 AM   GFR 81.99 01/12/2020 10:21 AM   GFR 81.20 01/13/2019 10:29 AM   MICROALBUR 6.3 (H) 06/28/2015 09:39 AM   MICROALBUR 4.4 (H) 06/26/2014 09:05 AM    Last diabetic Eye exam:  Lab Results  Component Value Date/Time   HMDIABEYEEXA No Retinopathy 07/21/2020 12:00 AM   HMDIABEYEEXA No Retinopathy 07/21/2020 12:00 AM    Last diabetic Foot exam:  Lab Results  Component Value Date/Time   HMDIABFOOTEX done by podiatry - in chart 05/19/2019 12:00 AM     Lab Results  Component Value Date   CHOL 132 08/31/2020   HDL 43 08/31/2020   LDLCALC 69 08/31/2020   LDLDIRECT 94.0 10/05/2016   TRIG 112 08/31/2020   CHOLHDL 3.1 08/31/2020    Hepatic Function Latest Ref Rng & Units 01/12/2020 01/13/2019 01/01/2018  Total Protein 6.0 - 8.3 g/dL 6.0 5.9(L) 6.0  Albumin 3.5 - 5.2 g/dL 4.1 3.9 4.1  AST 0 - 37 U/L _0 ALT 0 - 53 U/L _1 Alk Phosphatase 39 - 117 U/L  93 78 68  Total Bilirubin 0.2 - 1.2 mg/dL 0.8 0.7 0.8  Bilirubin, Direct 0.0 - 0.3 mg/dL - - -    Lab Results  Component Value Date/Time   TSH 3.11 06/26/2014 09:05 AM   TSH 0.342 ***Test methodology is 3rd generation TSH*** (L) 01/29/2010 08:41 PM   FREET4 0.88 06/26/2014 09:05 AM    CBC Latest Ref Rng & Units 03/02/2020 01/05/2017 07/21/2013  WBC 4.0 - 10.5 K/uL 8.5 5.7 6.1  Hemoglobin 13.0 - 17.0 g/dL 14.5 14.2 15.5  Hematocrit 39.0 - 52.0 % 42.8 41.3 43.6  Platelets 150 - 400 K/uL 227 209.0 204    No  results found for: VD25OH  Clinical ASCVD: Yes  The 10-year ASCVD risk score Mikey Bussing DC Jr., et al., 2013) is: 45%   Values used to calculate the score:     Age: 74 years     Sex: Male     Is Non-Hispanic African American: No     Diabetic: Yes     Tobacco smoker: No     Systolic Blood Pressure: 967 mmHg     Is BP treated: Yes     HDL Cholesterol: 43 mg/dL     Total Cholesterol: 132 mg/dL    Depression screen [facility] 2/9 01/12/2020 01/09/2019 01/01/2018  Decreased Interest 0 0 0  Down, Depressed, Hopeless 0 0 0  PHQ - 2 Score 0 0 0  Altered sleeping 0 0 0  Tired, decreased energy 0 0 0  Change in appetite 0 0 0  Feeling bad or failure about yourself  0 0 0  Trouble concentrating 0 0 0  Moving slowly or fidgety/restless 0 0 0  Suicidal thoughts 0 0 0  PHQ-9 Score 0 0 0  Difficult doing work/chores Not difficult at all Not difficult at all Not difficult at all  Some recent data might be hidden    Social History   Tobacco Use  Smoking Status Former Smoker  . Packs/day: 0.30  . Years: 50.00  . Pack years: 15.00  . Types: Cigarettes  . Quit date: 08/29/2011  . Years since quitting: 9.1  Smokeless Tobacco Never Used   BP Readings from Last 3 Encounters:  08/31/20 (!) 148/70  08/12/20 130/82  07/27/20 140/86   Pulse Readings from Last 3 Encounters:  08/31/20 83  08/12/20 89  07/27/20 88   Wt Readings from Last 3 Encounters:  08/31/20 219 lb (99.3 kg)  08/12/20 218 lb 3.2 oz (99 kg)  07/27/20 221 lb 5 oz (100.4 kg)   Assessment/Interventions: Review of patient past medical history, allergies, medications, health status, including review of consultants reports, laboratory and other test data, was performed as part of comprehensive evaluation and provision of chronic care management services.   SDOH:  (Social Determinants of Health) assessments and interventions performed: Yes SDOH Interventions   Flowsheet Row Most Recent Value  SDOH Interventions   Financial Strain  Interventions Intervention Not Indicated  [Medications affordable]      CCM Care Plan  No Known Allergies  Medications Reviewed Today    Reviewed by Debbora Dus, [facility] (Pharmacist) on 10/15/20 at 1142  Med List Status: <None>  Medication Order Taking? Sig Documenting Provider Last Dose Status Informant  amLODipine (NORVASC) 5 MG tablet [id number]  Take 1 tablet (5 mg total) by mouth daily. [name], MD  Active Self  b complex vitamins tablet [id number]  Take 1 tablet by mouth daily. [provider]  Active Self  Continuous Blood Gluc Transmit (  DEXCOM G6 TRANSMITTER) [place] [id number]  1 Device by Does not apply route every 3 (three) months. [name], MD  Active   Dextrose, Diabetic Use, (GLUCOSE PO) [id number]  Take 1 tablet by mouth as needed. [provider]  Active Self  glucagon (GLUCAGON EMERGENCY) 1 MG injection [id number]  Inject 1 mg into the muscle once as needed for up to 1 dose. [name], MD  Active Self  glucose blood [facility] ULTRA) test strip [id number]  USE AS INSTRUCTED 4X A DAY. DAY SUPPLY PER INSURANCE. [name], MD  Active   insulin aspart (NOVOLOG FLEXPEN) 100 UNIT/ML FlexPen [id number] No Inject 10-18 Units into the skin 3 (three) times daily with meals.  Patient not taking: Reported on 10/15/2020   [name], MD Not Taking Active   Insulin Pen Needle (ADVOCATE INSULIN PEN NEEDLES) 31G X 5 MM MISC [id number]  Use 3x a day [name], MD  Active Self  metFORMIN (GLUCOPHAGE-XR) 500 MG 24 hr tablet [id number] Yes TAKE 2 TABLETS (1,000 MG TOTAL) BY MOUTH DAILY. TAKE 2 TABLETS BY MOUTH TWICE DAILY WITH MEALS  Patient taking differently: 500 mg daily with breakfast.   [name], MD Taking Active Self  olmesartan-hydrochlorothiazide (BENICAR HCT) 40-25 MG tablet [id number]  Take 1 tablet by mouth daily. [name], MD  Active Self  pantoprazole (PROTONIX) 40 MG tablet [id number]  Take 1 tablet (40 mg  total) by mouth daily. [name], MD  Active Self  Semaglutide,0.25 or 0.5MG/DOS, (OZEMPIC, 0.25 OR 0.5 MG/DOSE,) 2 MG/1.5ML SOPN [id number] Yes Inject 0.5 mg into the skin once a week. [name], MD Taking Active   sildenafil (VIAGRA) 100 MG tablet [id number]  TAKE 0.5-1 TABLETS (50-100 MG TOTAL) BY MOUTH DAILY AS NEEDED FOR ERECTILE DYSFUNCTION. [name], MD  Active   simvastatin (ZOCOR) 20 MG tablet [id number]  TAKE 1 TABLET BY MOUTH EVERYDAY AT BEDTIME  Patient taking differently: Take 20 mg by mouth at bedtime. TAKE 1 TABLET BY MOUTH EVERYDAY AT BEDTIME   [name], MD  Active Self  tadalafil (CIALIS) 20 MG tablet [id number]  Take 0.5-1 tablets (10-20 mg total) by mouth every other day as needed for erectile dysfunction. [name], MD  Active   traMADol [name]) 50 MG tablet [id number]  Take 1 tablet (50 mg total) by mouth every 6 (six) hours as needed for moderate pain. [name], MD  Active   TRESIBA FLEXTOUCH 200 UNIT/ML FlexTouch Pen [id number] Yes INJECT 32 UNITS INTO THE SKIN DAILY. [name], MD Taking Active           Patient Active Problem List   Diagnosis Date Noted  . Other hyperlipidemia 08/31/2020  . COPD (chronic obstructive pulmonary disease) ([place]) 02/28/2020  . Coronary artery calcification seen on CAT scan 02/28/2020  . Aortic atherosclerosis ([place]) 02/28/2020  . Ex-smoker 01/21/2020  . RLQ abdominal pain 01/21/2020  . Numbness and tingling in left arm 01/21/2020  . Health maintenance examination 01/05/2017  . Right knee pain 01/18/2016  . Barrett's esophagus 12/08/2015  . Abnormal tympanic membrane of right ear 07/05/2015  . Advanced care planning/counseling discussion 06/30/2014  . Medicare annual wellness visit, subsequent 06/22/2013  . Benign prostatic hyperplasia with urinary obstruction 06/18/2013  . ED (erectile dysfunction) 04/10/2012  . Hyperlipidemia associated with type 2 diabetes mellitus ([place])  12/29/2008  . LEG PAIN, BILATERAL 11/19/2008  . 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 ANXIETY 07/24/2007  . Diabetes mellitus with coincident hypertension ([place])  07/24/2007  . GERD 07/24/2007    Immunization History  Administered Date(s) Administered  . Fluad Quad(high Dose 65+) 06/21/2020  . Influenza Whole 06/02/2008, 06/02/2009, 06/02/2010, 07/08/2011, 07/08/2012  . Influenza, High Dose Seasonal PF 05/18/2017, 06/25/2018, 04/18/2019  . Influenza,inj,Quad PF,6+ Mos 07/05/2015  . Influenza-Unspecified 05/28/2013, 05/28/2014, 06/30/2016  . PFIZER(Purple Top)SARS-COV-2 Vaccination 10/27/2019, 11/25/2019  . Pneumococcal Conjugate-13 06/18/2013  . Pneumococcal Polysaccharide-23 07/08/2011, 12/29/2016  . Tetanus 06/18/2013  . Zoster 08/26/2014  . Zoster Recombinat (Shingrix) 05/18/2017, 01/14/2018    Conditions to be addressed/monitored:  Diabetes   Current Barriers:  . Unable to achieve control of diabetes  . Does not adhere to prescribed medication regimen . History of hypoglycemia  Pharmacist Clinical Goal(s):  [name] Kitchen Over the next 30 days, patient will achieve adherence to monitoring guidelines and medication adherence to achieve therapeutic efficacy through collaboration with PharmD and provider.   Interventions: . 1:1 collaboration with [name], MD regarding development and update of comprehensive plan of care as evidenced by provider attestation and co-signature . Inter-disciplinary care team collaboration (see longitudinal plan of care) . Comprehensive medication review performed; medication list updated in electronic medical record  Diabetes (A1c goal <7%) -uncontrolled  Recent history - patient started on Ozempic 3 weeks ago (09/27/20 - Monday). Patient stopped all other diabetes medications due to misunderstanding. BG was very high for several days. Due to concern about his high BG he was using Novolog  PRN. Instructed to resume [name] and metformin at usual dose on 09/28/20. Patient resumed metformin but has been self-adjusting his [name], taking 24 units instead of 32. Asked him to hold Novolog until we could confirm an adequate dose of Tresiba. Patient misunderstood and began taking Tresiba 24 units twice daily before meals instead of Novolog. Thankfully, he has not has any hypoglycemic episodes and his BG actually much improved when he took the [name] 24 units twice daily and held the novolog. I have asked him to contact me each morning before taking his insulin and to review BG log. Memory is concern as he will write down instructions and report taking something different the next day. Due to his severe history of hypoglycemia, would prefer to hold Novolog until Ozempic is titrated, then re-assess. -Current medications:  Metformin 500 mg - 1 tablet daily  Insulin aspart (NovoLog) - on HOLD  Tresiba - 24 units twice daily   Ozempic 0.25 mg - Inject once weekly on Mondays -Medications previously tried: none -Current home glucose readings - checking BG before and after meals and bedtime (6-7 times daily) - Working on [employer], waiting on patient to receive AOB and return to [place], then [employer] will be mailed to him. 10/14/20 - on 24 units Tresiba twice daily   Before lunch - 185 (fasting)  After lunch - 202  Bedtime - 207 10/15/20 - on 24 units Tresiba twice daily   Before lunch: 116 (fasting) -Denies hypoglycemic/hyperglycemic symptoms -Current meal patterns: three meals per day -Current exercise: minimal -Educated onPrevention and management of hypoglycemic episodes; -Recommended to continue current medication; Patient to call each morning to report BG log and insulin dosing until regimen confirmed.  Patient Goals/Self-Care Activities . Over the next 30 days, patient will:  - take medications as prescribed check glucose 5-7 times daily, document, and provide at future  appointments  Follow Up Plan: The care management team will reach out to the patient again over the next 14 days. Patient will call with BG log each morning for the next 3 days  or longer if needed.  Medication Assistance: None required.  Patient affirms current coverage meets needs.  Patient's [facility] pharmacy [facility]:  [id number] [place] [address] [place]Phone: [phone]: [phone] [facility] (New Address) - [place]: [address]Phone: [phone]: [phone] Care Plan and Follow Up Patient Decision:  Patient agrees to Care Plan and Follow-up.  MDebbora Dus PharmD Clinical Pharmacist [phone]

## 2020-10-15 NOTE — Chronic Care Management (AMB) (Addendum)
Contacted [facility] to follow up on Anthony [name]. They state they have sent the order to [facility] for fulfillment. [name] to inquire. They state that they have attempted to contact Anthony [name] by phone, text and sent him an AOB form via mail for him to complete and return. Once this form is completed they can ship out [name] to patient.   Contacted patient to update him. He states he has not come across a form from [facility] but will be looking for it. Patient states he will call in a few days if he does not get one.   Follow-Up:  Care Coordination with Outside Provider and Pharmacist Review  Debbora Dus, CPP notified  [name], [place] Pharmacy Assistant [phone]

## 2020-10-18 NOTE — Progress Notes (Signed)
Patient contacted me on Saturday morning, 2/19.  - BG was 103 fasting, he confirmed taking Tresiba 24 units twice daily on Friday. Planned to continue Tresiba BID 24 units.  Did not hear from him Sunday morning, 2/20, contacted patient that evening.  - BG was 189. He had not eaten all day or taken any insulin Sunday. Reports a low of 67 the previous evening due to a small dinner. Encouraged him to take 24 units Tresiba, eat supper, and call in the morning with readings.   Patient contacted me Monday morning 2/21.  -BG 103 fasting, this was after just 24 units of Tresiba yesterday. We discussed going ahead and increasing his Ozempic to 0.5 mg today since this will be his 5th dose (per directions on prescription).  Continue 24 units of Tresiba once daily at bedtime.  10/19/20 - He is tolerating Ozempic 0.5 mg weekly well.  His BG today 195 (fasting), 210 after lunch, 220 pre dinner, 230 after dinner. He reports he took 1 shot [name] 24 units at bedtime.  10/20/20 - BG 136 fasting, 203, 207, 230, 235, 210. Took 1 shot of Tresiba 24 units at bedtime.  10/21/20 -BG  291 fasting, 296, 425, 411 - high all day so he took 2 doses Of Tresiba 24 units (one in morning and one in the evening) against advice. Asked him to please take 30 units of Tresiba once daily at same time each day.  10/22/20 - BG 58 this morning fasting, no symptoms. Took 4 glucose tabs and a glass of orange juice, ate a sandwich. Re-checked BG 2 hours later and BG 306. Recommended he go take 30 units Tresiba at bedtime.   10/23/20 - BG 128 this morning fasting. He took 2 doses [name] again yesterday 24 units each, against advice. Asked him to take 30 units [name] once daily. Pt confirmed he took 30 units at bedtime.  10/24/20 - Fasting BG 80, 129 before lunch, 135 after lunch. Pt did not take any insulin today due to low BG all day around 100s.   10/25/20 - Fasting BG 80. He reports he is eating better. Asked him to hold [name] today  unless BG > 200.  Current regimen:  Ozempic 0.5 mg weekly - increased on 10/18/20  Tresiba 24 units once daily in the evenings (currently on hold)  Debbora Dus, PharmD Clinical Pharmacist [facility] at [facility] [phone]

## 2020-10-21 DIAGNOSIS — E113291 Type 2 diabetes mellitus with mild nonproliferative diabetic retinopathy without macular edema, right eye: Secondary | ICD-10-CM | POA: Diagnosis not present

## 2020-10-21 DIAGNOSIS — Z794 Long term (current) use of insulin: Secondary | ICD-10-CM | POA: Diagnosis not present

## 2020-10-25 ENCOUNTER — Telehealth: Payer: Self-pay

## 2020-10-25 NOTE — Chronic Care Management (AMB) (Addendum)
Chronic Care Management Pharmacy Assistant   Name: CID AGENA  MRN: [id number] DOB: Mar 14, 1946  Reason for Encounter: Medication Coordination and Adherence   PCP : [name], MD  Allergies:  No Known Allergies  Medications: Outpatient Encounter Medications as of 10/25/2020  Medication Sig   amLODipine (NORVASC) 5 MG tablet Take 1 tablet (5 mg total) by mouth daily.   b complex vitamins tablet Take 1 tablet by mouth daily.   Continuous Blood Gluc Transmit (DEXCOM G6 TRANSMITTER) MISC 1 Device by Does not apply route every 3 (three) months.   Dextrose, Diabetic Use, (GLUCOSE PO) Take 1 tablet by mouth as needed.   glucagon (GLUCAGON EMERGENCY) 1 MG injection Inject 1 mg into the muscle once as needed for up to 1 dose.   glucose blood (ONETOUCH ULTRA) test strip USE AS INSTRUCTED 4X A DAY. DAY SUPPLY PER INSURANCE.   insulin aspart (NOVOLOG FLEXPEN) 100 UNIT/ML FlexPen Inject 10-18 Units into the skin 3 (three) times daily with meals. (Patient not taking: Reported on 10/15/2020)   Insulin Pen Needle (ADVOCATE INSULIN PEN NEEDLES) 31G X 5 MM MISC Use 3x a day   metFORMIN (GLUCOPHAGE-XR) 500 MG 24 hr tablet TAKE 2 TABLETS (1,000 MG TOTAL) BY MOUTH DAILY. TAKE 2 TABLETS BY MOUTH TWICE DAILY WITH MEALS (Patient taking differently: 500 mg daily with breakfast.)   olmesartan-hydrochlorothiazide (BENICAR HCT) 40-25 MG tablet Take 1 tablet by mouth daily.   pantoprazole (PROTONIX) 40 MG tablet Take 1 tablet (40 mg total) by mouth daily.   Semaglutide,0.25 or 0.5MG /DOS, (OZEMPIC, 0.25 OR 0.5 MG/DOSE,) 2 MG/1.5ML SOPN Inject 0.5 mg into the skin once a week.   sildenafil (VIAGRA) 100 MG tablet TAKE 0.5-1 TABLETS (50-100 MG TOTAL) BY MOUTH DAILY AS NEEDED FOR ERECTILE DYSFUNCTION.   simvastatin (ZOCOR) 20 MG tablet TAKE 1 TABLET BY MOUTH EVERYDAY AT BEDTIME (Patient taking differently: Take 20 mg by mouth at bedtime. TAKE 1 TABLET BY MOUTH EVERYDAY AT BEDTIME)   tadalafil (CIALIS) 20  MG tablet Take 0.5-1 tablets (10-20 mg total) by mouth every other day as needed for erectile dysfunction.   traMADol (ULTRAM) 50 MG tablet Take 1 tablet (50 mg total) by mouth every 6 (six) hours as needed for moderate pain.   TRESIBA FLEXTOUCH 200 UNIT/ML FlexTouch Pen INJECT 32 UNITS INTO THE SKIN DAILY.   No facility-administered encounter medications on file as of 10/25/2020.    Current Diagnosis: Patient Active Problem List   Diagnosis Date Noted   Other hyperlipidemia 08/31/2020   COPD (chronic obstructive pulmonary disease) ([place]) 02/28/2020   Coronary artery calcification seen on CAT scan 02/28/2020   Aortic atherosclerosis ([place]) 02/28/2020   Ex-smoker 01/21/2020   RLQ abdominal pain 01/21/2020   Numbness and tingling in left arm 01/21/2020   Health maintenance examination 01/05/2017   Right knee pain 01/18/2016   Barrett's esophagus 12/08/2015   Abnormal tympanic membrane of right ear 07/05/2015   Advanced care planning/counseling discussion 06/30/2014   Medicare annual wellness visit, subsequent 06/22/2013   Benign prostatic hyperplasia with urinary obstruction 06/18/2013   ED (erectile dysfunction) 04/10/2012   Hyperlipidemia associated with type 2 diabetes mellitus ([place]) 12/29/2008   LEG PAIN, BILATERAL 11/19/2008   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 ([place]) 07/24/2007   ANXIETY 07/24/2007   Diabetes mellitus with coincident hypertension ([place]) 07/24/2007   GERD 07/24/2007    Patient contacted Sharyn Lull requesting that all of his medications be transferred to [facility]. [facility] patient's medications for delivery, med sync, and adherence packaging from [facility]. Reviewed medications and amount remaining of each medication with patient. Delivery expected 10/28/20 for medications he needs.    Follow-Up:  Comptroller and Pharmacist  Review  Debbora Dus, CPP notified  [name], [place] Pharmacy Assistant [phone]

## 2020-10-25 NOTE — Progress Notes (Signed)
 Chronic Care Management Pharmacy Note  10/15/2020 Name:  Anthony [name] MRN:  [id number] DOB:  11/30/1945  Subjective: Anthony [name] is an 75 y.o. year old male who is a primary patient of Gutierrez, Javier, MD.  The CCM team was consulted for assistance with disease management and care coordination needs.    Engaged with patient by telephone for follow up visit in response to provider referral for pharmacy case management and/or care coordination services.   Consent to Services:  The patient was given information about Chronic Care Management services, agreed to services, and gave verbal consent prior to initiation of services.  Please see initial visit note for detailed documentation.   Patient Care Team: Gutierrez, Javier, MD as PCP - General (Family Medicine) Roy, Mark, MD (Neurosurgery) Byerly, Faera, MD as Consulting Physician (General Surgery) Ottelin, Mark, MD (Inactive) as Consulting Physician (Urology) Bulakowski, Neill, OD as Consulting Physician (Optometry) Camia Dipinto, RPH as Pharmacist (Pharmacist)  Office Visits:  07/27/20: Shannon called pt to discuss dexcom education, asked pt to download app and call to schedule appt with nurse for education  07/27/20: PCP/Gutierrez - brittle diabetes, schedule follow up with endo, Dexcom sample, self-tapering meds, unclear about Novolog dose   01/20/20: PCP/Gutierrez - suspect right inguinal hernia; numbness in left arm - suggest vitamin B12 trial for 1 month; LDL at goal on simvastatin; GERD on PPI; referral for cancer screening; HTN stable; DM uncontrolled. Encouraged endo f/u; BPH stable, chronic, s/p TURP   Consult Visit:   08/12/20: Endocrinology - take Novolog 15 min before meals, keep log of when BG is high (missed doses, etc), continue Tresiba 32 units daily, metformin ER 500 mg daily, Novolog sliding scale - 12 units small meals, 16 regular meals, 18 large meals   06/02/20: Podiatry - annual DM foot exam    02/25/20: Pulmonology - lung cancer screening program  Hospital visits: None in previous 6 months  Objective:  Lab Results  Component Value Date   CREATININE 1.02 03/02/2020   BUN 12 03/02/2020   GFR 81.99 01/12/2020   GFRNONAA >60 03/02/2020   GFRAA >60 03/02/2020   NA 139 03/02/2020   K 3.6 03/02/2020   CALCIUM 9.0 03/02/2020   CO2 29 03/02/2020    Lab Results  Component Value Date/Time   HGBA1C 7.5 (A) 07/27/2020 03:13 PM   HGBA1C 7.7 (H) 03/02/2020 10:26 AM   HGBA1C 7.8 (H) 01/12/2020 10:21 AM   GFR 81.99 01/12/2020 10:21 AM   GFR 81.20 01/13/2019 10:29 AM   MICROALBUR 6.3 (H) 06/28/2015 09:39 AM   MICROALBUR 4.4 (H) 06/26/2014 09:05 AM    Last diabetic Eye exam:  Lab Results  Component Value Date/Time   HMDIABEYEEXA No Retinopathy 07/21/2020 12:00 AM   HMDIABEYEEXA No Retinopathy 07/21/2020 12:00 AM    Last diabetic Foot exam:  Lab Results  Component Value Date/Time   HMDIABFOOTEX done by podiatry - in chart 05/19/2019 12:00 AM     Lab Results  Component Value Date   CHOL 132 08/31/2020   HDL 43 08/31/2020   LDLCALC 69 08/31/2020   LDLDIRECT 94.0 10/05/2016   TRIG 112 08/31/2020   CHOLHDL 3.1 08/31/2020    Hepatic Function Latest Ref Rng & Units 01/12/2020 01/13/2019 01/01/2018  Total Protein 6.0 - 8.3 g/dL 6.0 5.9(L) 6.0  Albumin 3.5 - 5.2 g/dL 4.1 3.9 4.1  AST 0 - 37 U/L 15 14 13  ALT 0 - 53 U/L 13 14 13  Alk Phosphatase 39 - 117 U/L   93 78 68  Total Bilirubin 0.2 - 1.2 mg/dL 0.8 0.7 0.8  Bilirubin, Direct 0.0 - 0.3 mg/dL - - -    Lab Results  Component Value Date/Time   TSH 3.11 06/26/2014 09:05 AM   TSH 0.342 ***Test methodology is 3rd generation TSH*** (L) 01/29/2010 08:41 PM   FREET4 0.88 06/26/2014 09:05 AM    CBC Latest Ref Rng & Units 03/02/2020 01/05/2017 07/21/2013  WBC 4.0 - 10.5 K/uL 8.5 5.7 6.1  Hemoglobin 13.0 - 17.0 g/dL 14.5 14.2 15.5  Hematocrit 39.0 - 52.0 % 42.8 41.3 43.6  Platelets 150 - 400 K/uL 227 209.0 204    No  results found for: VD25OH  Clinical ASCVD: Yes  The 10-year ASCVD risk score (Goff DC Jr., et al., 2013) is: 45%   Values used to calculate the score:     Age: 75 years     Sex: Male     Is Non-Hispanic African American: No     Diabetic: Yes     Tobacco smoker: No     Systolic Blood Pressure: 148 mmHg     Is BP treated: Yes     HDL Cholesterol: 43 mg/dL     Total Cholesterol: 132 mg/dL    Depression screen PHQ 2/9 01/12/2020 01/09/2019 01/01/2018  Decreased Interest 0 0 0  Down, Depressed, Hopeless 0 0 0  PHQ - 2 Score 0 0 0  Altered sleeping 0 0 0  Tired, decreased energy 0 0 0  Change in appetite 0 0 0  Feeling bad or failure about yourself  0 0 0  Trouble concentrating 0 0 0  Moving slowly or fidgety/restless 0 0 0  Suicidal thoughts 0 0 0  PHQ-9 Score 0 0 0  Difficult doing work/chores Not difficult at all Not difficult at all Not difficult at all  Some recent data might be hidden    Social History   Tobacco Use  Smoking Status Former Smoker  . Packs/day: 0.30  . Years: 50.00  . Pack years: 15.00  . Types: Cigarettes  . Quit date: 08/29/2011  . Years since quitting: 9.1  Smokeless Tobacco Never Used   BP Readings from Last 3 Encounters:  08/31/20 (!) 148/70  08/12/20 130/82  07/27/20 140/86   Pulse Readings from Last 3 Encounters:  08/31/20 83  08/12/20 89  07/27/20 88   Wt Readings from Last 3 Encounters:  08/31/20 219 lb (99.3 kg)  08/12/20 218 lb 3.2 oz (99 kg)  07/27/20 221 lb 5 oz (100.4 kg)   Assessment/Interventions: Review of patient past medical history, allergies, medications, health status, including review of consultants reports, laboratory and other test data, was performed as part of comprehensive evaluation and provision of chronic care management services.   SDOH:  (Social Determinants of Health) assessments and interventions performed: Yes SDOH Interventions   Flowsheet Row Most Recent Value  SDOH Interventions   Financial Strain  Interventions Intervention Not Indicated  [Medications affordable]      CCM Care Plan  No Known Allergies  Medications Reviewed Today    Reviewed by Frances Joynt, RPH (Pharmacist) on 10/15/20 at 1142  Med List Status: <None>  Medication Order Taking? Sig Documenting Provider Last Dose Status Informant  amLODipine (NORVASC) 5 MG tablet [id number]  Take 1 tablet (5 mg total) by mouth daily. Gutierrez, Javier, MD  Active Self  b complex vitamins tablet [id number]  Take 1 tablet by mouth daily. [provider]  Active Self  Continuous Blood Gluc Transmit (  DEXCOM G6 TRANSMITTER) MISC [id number]  1 Device by Does not apply route every 3 (three) months. Gherghe, Cristina, MD  Active   Dextrose, Diabetic Use, (GLUCOSE PO) [id number]  Take 1 tablet by mouth as needed. [provider]  Active Self  glucagon (GLUCAGON EMERGENCY) 1 MG injection [id number]  Inject 1 mg into the muscle once as needed for up to 1 dose. Gherghe, Cristina, MD  Active Self  glucose blood (ONETOUCH ULTRA) test strip [id number]  USE AS INSTRUCTED 4X A DAY. DAY SUPPLY PER INSURANCE. Gherghe, Cristina, MD  Active   insulin aspart (NOVOLOG FLEXPEN) 100 UNIT/ML FlexPen [id number] No Inject 10-18 Units into the skin 3 (three) times daily with meals.  Patient not taking: Reported on 10/15/2020   Gherghe, Cristina, MD Not Taking Active   Insulin Pen Needle (ADVOCATE INSULIN PEN NEEDLES) 31G X 5 MM MISC [id number]  Use 3x a day Gherghe, Cristina, MD  Active Self  metFORMIN (GLUCOPHAGE-XR) 500 MG 24 hr tablet [id number] Yes TAKE 2 TABLETS (1,000 MG TOTAL) BY MOUTH DAILY. TAKE 2 TABLETS BY MOUTH TWICE DAILY WITH MEALS  Patient taking differently: 500 mg daily with breakfast.   Gherghe, Cristina, MD Taking Active Self  olmesartan-hydrochlorothiazide (BENICAR HCT) 40-25 MG tablet [id number]  Take 1 tablet by mouth daily. Gutierrez, Javier, MD  Active Self  pantoprazole (PROTONIX) 40 MG tablet [id number]  Take 1 tablet (40 mg  total) by mouth daily. Gutierrez, Javier, MD  Active Self  Semaglutide,0.25 or 0.5MG/DOS, (OZEMPIC, 0.25 OR 0.5 MG/DOSE,) 2 MG/1.5ML SOPN [id number] Yes Inject 0.5 mg into the skin once a week. Gherghe, Cristina, MD Taking Active   sildenafil (VIAGRA) 100 MG tablet [id number]  TAKE 0.5-1 TABLETS (50-100 MG TOTAL) BY MOUTH DAILY AS NEEDED FOR ERECTILE DYSFUNCTION. Gutierrez, Javier, MD  Active   simvastatin (ZOCOR) 20 MG tablet [id number]  TAKE 1 TABLET BY MOUTH EVERYDAY AT BEDTIME  Patient taking differently: Take 20 mg by mouth at bedtime. TAKE 1 TABLET BY MOUTH EVERYDAY AT BEDTIME   Gutierrez, Javier, MD  Active Self  tadalafil (CIALIS) 20 MG tablet [id number]  Take 0.5-1 tablets (10-20 mg total) by mouth every other day as needed for erectile dysfunction. Gutierrez, Javier, MD  Active   traMADol (ULTRAM) 50 MG tablet [id number]  Take 1 tablet (50 mg total) by mouth every 6 (six) hours as needed for moderate pain. Blackman, Douglas, MD  Active   TRESIBA FLEXTOUCH 200 UNIT/ML FlexTouch Pen [id number] Yes INJECT 32 UNITS INTO THE SKIN DAILY. Gherghe, Cristina, MD Taking Active           Patient Active Problem List   Diagnosis Date Noted  . Other hyperlipidemia 08/31/2020  . COPD (chronic obstructive pulmonary disease) ([name]) 02/28/2020  . Coronary artery calcification seen on CAT scan 02/28/2020  . Aortic atherosclerosis ([name]) 02/28/2020  . Ex-smoker 01/21/2020  . RLQ abdominal pain 01/21/2020  . Numbness and tingling in left arm 01/21/2020  . Health maintenance examination 01/05/2017  . Right knee pain 01/18/2016  . Barrett's esophagus 12/08/2015  . Abnormal tympanic membrane of right ear 07/05/2015  . Advanced care planning/counseling discussion 06/30/2014  . Medicare annual wellness visit, subsequent 06/22/2013  . Benign prostatic hyperplasia with urinary obstruction 06/18/2013  . ED (erectile dysfunction) 04/10/2012  . Hyperlipidemia associated with type 2 diabetes mellitus ([name])  12/29/2008  . LEG PAIN, BILATERAL 11/19/2008  . 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 ([name]) 07/24/2007  . ANXIETY 07/24/2007  . Diabetes mellitus with coincident hypertension ([name])   07/24/2007  . GERD 07/24/2007    Immunization History  Administered Date(s) Administered  . Fluad Quad(high Dose 65+) 06/21/2020  . Influenza Whole 06/02/2008, 06/02/2009, 06/02/2010, 07/08/2011, 07/08/2012  . Influenza, High Dose Seasonal PF 05/18/2017, 06/25/2018, 04/18/2019  . Influenza,inj,Quad PF,6+ Mos 07/05/2015  . Influenza-Unspecified 05/28/2013, 05/28/2014, 06/30/2016  . PFIZER(Purple Top)SARS-COV-2 Vaccination 10/27/2019, 11/25/2019  . Pneumococcal Conjugate-13 06/18/2013  . Pneumococcal Polysaccharide-23 07/08/2011, 12/29/2016  . Tetanus 06/18/2013  . Zoster 08/26/2014  . Zoster Recombinat (Shingrix) 05/18/2017, 01/14/2018    Conditions to be addressed/monitored:  Diabetes   Current Barriers:  . Unable to achieve control of diabetes  . Does not adhere to prescribed medication regimen . History of hypoglycemia  Pharmacist Clinical Goal(s):  . Over the next 30 days, patient will achieve adherence to monitoring guidelines and medication adherence to achieve therapeutic efficacy through collaboration with PharmD and provider.   Interventions: . 1:1 collaboration with Gutierrez, Javier, MD regarding development and update of comprehensive plan of care as evidenced by provider attestation and co-signature . Inter-disciplinary care team collaboration (see longitudinal plan of care) . Comprehensive medication review performed; medication list updated in electronic medical record  Diabetes (A1c goal <7%) -uncontrolled  Recent history - patient started on Ozempic 3 weeks ago (09/27/20 - Monday). Patient stopped all other diabetes medications due to misunderstanding. BG was very high for several days. Due to concern about his high BG he was using Novolog  PRN. Instructed to resume Tresiba and metformin at usual dose on 09/28/20. Patient resumed metformin but has been self-adjusting his Tresiba, taking 24 units instead of 32. Asked him to hold Novolog until we could confirm an adequate dose of Tresiba. Patient misunderstood and began taking Tresiba 24 units twice daily before meals instead of Novolog. Thankfully, he has not has any hypoglycemic episodes and his BG actually much improved when he took the Tresiba 24 units twice daily and held the novolog. I have asked him to contact me each morning before taking his insulin and to review BG log. Memory is concern as he will write down instructions and report taking something different the next day. Due to his severe history of hypoglycemia, would prefer to hold Novolog until Ozempic is titrated, then re-assess. -Current medications:  Metformin 500 mg - 1 tablet daily  Insulin aspart (NovoLog) - on HOLD  Tresiba - 24 units twice daily   Ozempic 0.25 mg - Inject once weekly on Mondays -Medications previously tried: none -Current home glucose readings - checking BG before and after meals and bedtime (6-7 times daily) - Working on Dexcom approval, waiting on patient to receive AOB and return to [facility], then Dexcom will be mailed to him. 10/14/20 - on 24 units Tresiba twice daily   Before lunch - 185 (fasting)  After lunch - 202  Bedtime - 207 10/15/20 - on 24 units Tresiba twice daily   Before lunch: 116 (fasting) -Denies hypoglycemic/hyperglycemic symptoms -Current meal patterns: three meals per day -Current exercise: minimal -Educated onPrevention and management of hypoglycemic episodes; -Recommended to continue current medication; Patient to call each morning to report BG log and insulin dosing until regimen confirmed.  Patient Goals/Self-Care Activities . Over the next 30 days, patient will:  - take medications as prescribed check glucose 5-7 times daily, document, and provide at future  appointments  Follow Up Plan: The care management team will reach out to the patient again over the next 14 days. Patient will call with BG log each morning for the next 3 days   or longer if needed.  Medication Assistance: None required.  Patient affirms current coverage meets needs.  Patient's preferred pharmacy is:  [id number] - [place], [place] - [address] [place] [address] Phone: [phone] Fax: [phone]  [facility] (New Address) - [place], [place] - [address] AT Previously: [address], [place] [address] [place] [address] Phone: [phone] Fax: [phone]  Care Plan and Follow Up Patient Decision:  Patient agrees to Care Plan and Follow-up.  Bassam Dresch, PharmD Clinical Pharmacist [facility] [place] [phone]      

## 2020-10-26 ENCOUNTER — Telehealth: Payer: Self-pay

## 2020-10-26 NOTE — Telephone Encounter (Addendum)
Patient called. He received his Dexcom in the mail and plans to have his son come this evening to help him download app and apply sensor.   Debbora Dus, PharmD Clinical Pharmacist [facility] [place]  Time Spent: 5 min

## 2020-10-27 ENCOUNTER — Ambulatory Visit (INDEPENDENT_AMBULATORY_CARE_PROVIDER_SITE_OTHER): Payer: Medicare HMO | Admitting: Family Medicine

## 2020-10-27 ENCOUNTER — Other Ambulatory Visit: Payer: Self-pay

## 2020-10-27 ENCOUNTER — Encounter: Payer: Self-pay | Admitting: Family Medicine

## 2020-10-27 VITALS — BP 136/78 | HR 86 | Temp 97.5°F | Ht 72.0 in | Wt 210.4 lb

## 2020-10-27 DIAGNOSIS — Z91199 Patient's noncompliance with other medical treatment and regimen due to unspecified reason: Secondary | ICD-10-CM | POA: Insufficient documentation

## 2020-10-27 DIAGNOSIS — Z794 Long term (current) use of insulin: Secondary | ICD-10-CM

## 2020-10-27 DIAGNOSIS — Z9119 Patient's noncompliance with other medical treatment and regimen: Secondary | ICD-10-CM | POA: Diagnosis not present

## 2020-10-27 DIAGNOSIS — E113299 Type 2 diabetes mellitus with mild nonproliferative diabetic retinopathy without macular edema, unspecified eye: Secondary | ICD-10-CM

## 2020-10-27 LAB — POCT GLYCOSYLATED HEMOGLOBIN (HGB A1C): Hemoglobin A1C: 8 % — AB (ref 4.0–5.6)

## 2020-10-27 NOTE — Assessment & Plan Note (Addendum)
He's changed around his medications again. Will update med list.  Currently working with our pharmacist, hopeful this will help get him on a more regular routine.  Also just got dexcom CGM - will try to place at home and if needed will return to office with pharmacist for CGM teaching.

## 2020-10-27 NOTE — Progress Notes (Signed)
Patient ID: Anthony [name], male    DOB: 1946-06-11, 75 y.o.   MRN: [id number]  This visit was conducted in person.  BP 136/78   Pulse 86   Temp (!) 97.5 F (36.4 C) (Temporal)   Ht 6' (1.829 m)   Wt 210 lb 7 oz (95.5 kg)   SpO2 97%   BMI 28.54 kg/m    CC: DM f/u visit  Subjective:   HPI: Anthony [name] is a 75 y.o. male presenting on 10/27/2020 for Follow-up (Here for f/u.  Pt provided recent BS readings. )   DM - does regularly check sugars and brings log which was reviewed: checks at least TID. This week fasting 62-292, 2 hours after meal 110s-380s. Compliant with antihyperglycemic regimen which includes: novolog 10-18u TID (he's not been taking for several weeks), tresiba 32u daily, metformin XR 1000mg  just once daily (bid cause diarrhea), ozempic 0.5mg  weekly. Previously had stopped all meds expect ozempic due to confusion or meds. Several readings below 70 especially in the mornings. Denies paresthesias. Sees endo 11/2020. Last diabetic eye exam 09/2020. Pneumovax: 2018. Prevnar: 2014. Glucometer brand: DexCom CGM - just received meter yesterday, hasn't been placed yet. DSME: remotely.  Lab Results  Component Value Date   HGBA1C 8.0 (A) 10/27/2020   Diabetic Foot Exam - Simple   No data filed    Lab Results  Component Value Date   MICROALBUR 6.3 (H) 06/28/2015         Relevant past medical, surgical, family and social history reviewed and updated as indicated. Interim medical history since our last visit reviewed. Allergies and medications reviewed and updated. Outpatient Medications Prior to Visit  Medication Sig Dispense Refill  . amLODipine (NORVASC) 5 MG tablet Take 1 tablet (5 mg total) by mouth daily. 90 tablet 3  . b complex vitamins tablet Take 1 tablet by mouth daily.    . Continuous Blood Gluc Transmit (DEXCOM G6 TRANSMITTER) MISC 1 Device by Does not apply route every 3 (three) months. 1 each 3  . Dextrose, Diabetic Use, (GLUCOSE PO) Take 1 tablet by  mouth as needed.    [name] Kitchen glucagon (GLUCAGON EMERGENCY) 1 MG injection Inject 1 mg into the muscle once as needed for up to 1 dose. 1 each 12  . glucose blood (ONETOUCH ULTRA) test strip USE AS INSTRUCTED 4X A DAY. DAY SUPPLY PER INSURANCE. 400 strip 1  . Insulin Pen Needle (ADVOCATE INSULIN PEN NEEDLES) 31G X 5 MM MISC Use 3x a day 300 each 3  . olmesartan-hydrochlorothiazide (BENICAR HCT) 40-25 MG tablet Take 1 tablet by mouth daily. 90 tablet 3  . pantoprazole (PROTONIX) 40 MG tablet Take 1 tablet (40 mg total) by mouth daily. 90 tablet 3  . Semaglutide,0.25 or 0.5MG /DOS, (OZEMPIC, 0.25 OR 0.5 MG/DOSE,) 2 MG/1.5ML SOPN Inject 0.5 mg into the skin once a week. 4.5 mL 3  . sildenafil (VIAGRA) 100 MG tablet TAKE 0.5-1 TABLETS (50-100 MG TOTAL) BY MOUTH DAILY AS NEEDED FOR ERECTILE DYSFUNCTION. 10 tablet 3  . simvastatin (ZOCOR) 20 MG tablet TAKE 1 TABLET BY MOUTH EVERYDAY AT BEDTIME (Patient taking differently: Take 20 mg by mouth at bedtime. TAKE 1 TABLET BY MOUTH EVERYDAY AT BEDTIME) 90 tablet 3  . tadalafil (CIALIS) 20 MG tablet Take 0.5-1 tablets (10-20 mg total) by mouth every other day as needed for erectile dysfunction. 5 tablet 6  . traMADol (ULTRAM) 50 MG tablet Take 1 tablet (50 mg total) by mouth every 6 (six) hours  as needed for moderate pain. 25 tablet 0  . TRESIBA FLEXTOUCH 200 UNIT/ML FlexTouch Pen INJECT 32 UNITS INTO THE SKIN DAILY. 9 pen 2  . metFORMIN (GLUCOPHAGE-XR) 500 MG 24 hr tablet TAKE 2 TABLETS (1,000 MG TOTAL) BY MOUTH DAILY. TAKE 2 TABLETS BY MOUTH TWICE DAILY WITH MEALS (Patient taking differently: 500 mg daily with breakfast.) 360 tablet 0  . insulin aspart (NOVOLOG FLEXPEN) 100 UNIT/ML FlexPen Inject 10-18 Units into the skin 3 (three) times daily with meals. (Patient not taking: No sig reported) 48.6 mL 1  . metFORMIN (GLUCOPHAGE-XR) 500 MG 24 hr tablet Take 2 tablets (1,000 mg total) by mouth daily with breakfast.     No facility-administered medications prior to visit.      Per HPI unless specifically indicated in ROS section below Review of Systems Objective:  BP 136/78   Pulse 86   Temp (!) 97.5 F (36.4 C) (Temporal)   Ht 6' (1.829 m)   Wt 210 lb 7 oz (95.5 kg)   SpO2 97%   BMI 28.54 kg/m   Wt Readings from Last 3 Encounters:  10/27/20 210 lb 7 oz (95.5 kg)  08/31/20 219 lb (99.3 kg)  08/12/20 218 lb 3.2 oz (99 kg)      Physical Exam Vitals and nursing note reviewed.  Constitutional:      General: He is not in acute distress.    Appearance: Normal appearance. He is well-developed and well-nourished. He is not ill-appearing.  HENT:     Head: Normocephalic and atraumatic.     Mouth/Throat:     Mouth: Oropharynx is clear and moist.  Eyes:     General: No scleral icterus.    Extraocular Movements: Extraocular movements intact and EOM normal.     Conjunctiva/sclera: Conjunctivae normal.     Pupils: Pupils are equal, round, and reactive to light.  Cardiovascular:     Rate and Rhythm: Normal rate and regular rhythm.     Pulses: Normal pulses and intact distal pulses.     Heart sounds: Normal heart sounds. No murmur heard.   Pulmonary:     Effort: Pulmonary effort is normal. No respiratory distress.     Breath sounds: Normal breath sounds. No wheezing, rhonchi or rales.  Musculoskeletal:        General: No edema.     Right lower leg: No edema.     Left lower leg: No edema.     Comments: See HPI for foot exam if done  Skin:    General: Skin is warm and dry.     Findings: No rash.  Neurological:     Mental Status: He is alert.  Psychiatric:        Mood and Affect: Mood and affect and mood normal.        Behavior: Behavior normal.       Results for orders placed or performed in visit on 10/27/20  POCT glycosylated hemoglobin (Hb A1C)  Result Value Ref Range   Hemoglobin A1C 8.0 (A) 4.0 - 5.6 %   HbA1c POC (<> result, manual entry)     HbA1c, POC (prediabetic range)     HbA1c, POC (controlled diabetic range)     Assessment  & Plan:  This visit occurred during the SARS-CoV-2 public health emergency.  Safety protocols were in place, including screening questions prior to the visit, additional usage of staff PPE, and extensive cleaning of exam room while observing appropriate contact time as indicated for disinfecting solutions.  Problem List Items Addressed This Visit    Non-adherence to medical treatment    He's changed around his medications again. Will update med list.  Currently working with our pharmacist, hopeful this will help get him on a more regular routine.  Also just got dexcom CGM - will try to place at home and if needed will return to office with pharmacist for CGM teaching.       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  Ongoing significant confusion over how he takes medications.  Today he states he's only been taking metformin 1000mg  XR once daily as BID dose caused diarrhea. I will update chart and notify Dr Cruzita Lederer.  He continues ozempic and tresiba regularly (although had been changing tresiba dose based on sugars). Now states he's regularly taking tresiba 32u daily.  He had stopped novolog for several weeks, and current plan was to hold for now and restart pending glycemic effect of regular ozempic and tresiba. Will likely need mealtime correction given marked fluctuations in postprandial cbg's.  Has upcoming endo f/u scheduled next month.  Given ongoing lows, provided with 15-15 plan for hypoglycemia.       Relevant Medications   metFORMIN (GLUCOPHAGE-XR) 500 MG 24 hr tablet   Other Relevant Orders   POCT glycosylated hemoglobin (Hb A1C) (Completed)       No orders of the defined types were placed in this encounter.  Orders Placed This Encounter  Procedures  . POCT glycosylated hemoglobin (Hb A1C)    Patient Instructions  Return in 4 months for physical/wellness visit   Continue current diabetes medicines: 1. ozempic 0.5mg  weekly 2. tresiba 32 units daily 3. Metformin XR 1000mg  daily 4. Novolog (not currently taking)  The 15-15 rule for low sugars: If sugar reading below 70, have 15 grams of carbohydrate to raise your blood sugar and check it after 15 minutes. If it's still below 70 mg/dL, have another serving. 15 grams of carbs may be: -Glucose tablets (see instructions) -Gel tube (see instructions) -4 ounces (1/2 cup) of juice or regular soda (not diet) -1 tablespoon of sugar, honey, or corn syrup -Hard candies, jellybeans or gumdrops--see food label for how many to consume  Repeat these steps until your blood sugar is at least 70 mg/dL. Once your blood sugar is back to normal, eat a meal or snack to make sure it doesn't lower again.     Follow up plan: Return in about 4 months (around 02/26/2021) for annual exam, prior fasting for blood work, medicare wellness visit.  [name], MD

## 2020-10-27 NOTE — Assessment & Plan Note (Addendum)
Chronic, brittle diabetes followed by endocrinology.  Ongoing significant confusion over how he takes medications.  Today he states he's only been taking metformin 1000mg  XR once daily as BID dose caused diarrhea. I will update chart and notify Dr Cruzita Lederer.  He continues ozempic and tresiba regularly (although had been changing tresiba dose based on sugars). Now states he's regularly taking tresiba 32u daily.  He had stopped novolog for several weeks, and current plan was to hold for now and restart pending glycemic effect of regular ozempic and tresiba. Will likely need mealtime correction given marked fluctuations in postprandial cbg's.  Has upcoming endo f/u scheduled next month.  Given ongoing lows, provided with 15-15 plan for hypoglycemia.

## 2020-10-27 NOTE — Patient Instructions (Addendum)
Return in 4 months for physical/wellness visit  Continue current diabetes medicines: 1. ozempic 0.5mg  weekly 2. tresiba 32 units daily 3. Metformin XR 1000mg  daily 4. Novolog (not currently taking)  The 15-15 rule for low sugars: If sugar reading below 70, have 15 grams of carbohydrate to raise your blood sugar and check it after 15 minutes. If it's still below 70 mg/dL, have another serving. 15 grams of carbs may be: -Glucose tablets (see instructions) -Gel tube (see instructions) -4 ounces (1/2 cup) of juice or regular soda (not diet) -1 tablespoon of sugar, honey, or corn syrup -Hard candies, jellybeans or gumdrops--see food label for how many to consume  Repeat these steps until your blood sugar is at least 70 mg/dL. Once your blood sugar is back to normal, eat a meal or snack to make sure it doesn't lower again.

## 2020-11-01 ENCOUNTER — Telehealth: Payer: Self-pay

## 2020-11-01 NOTE — Chronic Care Management (AMB) (Addendum)
Chronic Care Management Pharmacy Assistant   Name: ANA LIAW  MRN: [id number] DOB: [birth year]   Reason for Encounter: Disease State- BG Log   Conditions to be addressed/monitored: DMII   Recent office visits:  10/27/20- Dr. Danise Mina- PCP- decreased metformin from 4 tablets daily to one tablet daily.   Recent consult visits:  None  Hospital visits:  None in previous 6 months  Medications: Outpatient Encounter Medications as of 11/01/2020  Medication Sig   amLODipine (NORVASC) 5 MG tablet Take 1 tablet (5 mg total) by mouth daily.   b complex vitamins tablet Take 1 tablet by mouth daily.   Continuous Blood Gluc Transmit (DEXCOM G6 TRANSMITTER) MISC 1 Device by Does not apply route every 3 (three) months.   Dextrose, Diabetic Use, (GLUCOSE PO) Take 1 tablet by mouth as needed.   glucagon (GLUCAGON EMERGENCY) 1 MG injection Inject 1 mg into the muscle once as needed for up to 1 dose.   glucose blood (ONETOUCH ULTRA) test strip USE AS INSTRUCTED 4X A DAY. DAY SUPPLY PER INSURANCE.   insulin aspart (NOVOLOG FLEXPEN) 100 UNIT/ML FlexPen Inject 10-18 Units into the skin 3 (three) times daily with meals. (Patient not taking: No sig reported)   Insulin Pen Needle (ADVOCATE INSULIN PEN NEEDLES) 31G X 5 MM MISC Use 3x a day   metFORMIN (GLUCOPHAGE-XR) 500 MG 24 hr tablet Take 2 tablets (1,000 mg total) by mouth daily with breakfast.   olmesartan-hydrochlorothiazide (BENICAR HCT) 40-25 MG tablet Take 1 tablet by mouth daily.   pantoprazole (PROTONIX) 40 MG tablet Take 1 tablet (40 mg total) by mouth daily.   Semaglutide,0.25 or 0.5MG /DOS, (OZEMPIC, 0.25 OR 0.5 MG/DOSE,) 2 MG/1.5ML SOPN Inject 0.5 mg into the skin once a week.   sildenafil (VIAGRA) 100 MG tablet TAKE 0.5-1 TABLETS (50-100 MG TOTAL) BY MOUTH DAILY AS NEEDED FOR ERECTILE DYSFUNCTION.   simvastatin (ZOCOR) 20 MG tablet TAKE 1 TABLET BY MOUTH EVERYDAY AT BEDTIME (Patient taking differently: Take 20 mg by mouth at  bedtime. TAKE 1 TABLET BY MOUTH EVERYDAY AT BEDTIME)   tadalafil (CIALIS) 20 MG tablet Take 0.5-1 tablets (10-20 mg total) by mouth every other day as needed for erectile dysfunction.   traMADol (ULTRAM) 50 MG tablet Take 1 tablet (50 mg total) by mouth every 6 (six) hours as needed for moderate pain.   TRESIBA FLEXTOUCH 200 UNIT/ML FlexTouch Pen INJECT 32 UNITS INTO THE SKIN DAILY.   No facility-administered encounter medications on file as of 11/01/2020.   Recent Relevant Labs: Lab Results  Component Value Date/Time   HGBA1C 8.0 (A) 10/27/2020 10:56 AM   HGBA1C 7.5 (A) 07/27/2020 03:13 PM   HGBA1C 7.7 (H) 03/02/2020 10:26 AM   HGBA1C 7.8 (H) 01/12/2020 10:21 AM   MICROALBUR 6.3 (H) 06/28/2015 09:39 AM   MICROALBUR 4.4 (H) 06/26/2014 09:05 AM    Kidney Function Lab Results  Component Value Date/Time   CREATININE 1.02 03/02/2020 10:26 AM   CREATININE 0.91 01/12/2020 10:21 AM   GFR 81.99 01/12/2020 10:21 AM   GFRNONAA >60 03/02/2020 10:26 AM   GFRAA >60 03/02/2020 10:26 AM    Current antihyperglycemic regimen:   ozempic 0.5mg  weekly  tresiba 32 units daily (Patient only taking 24 units)  Metformin XR 1000mg  daily  Novolog (not currently taking)  What recent interventions/DTPs have been made to improve glycemic control:  Dr. Danise Mina increased [name] and decreased metformin.  Have there been any recent hospitalizations or ED visits since last visit with CPP? No  Patient denies hypoglycemic symptoms, including Pale, Sweaty, Shaky, Hungry, Nervous/irritable and Vision changes   Patient denies hyperglycemic symptoms, including blurry vision, excessive thirst, fatigue, polyuria and weakness   How often are you checking your blood sugar? 3-4 times daily   What are your blood sugars ranging?  Fasting:  11/01/20- 106 10/31/20- 62 Before meals:  10/31/20- 71 10/31/20- 95 After meals:  10/31/20- 89 10/31/20- 152 Bedtime:  10/31/20- 200  During the week, how often does your blood  glucose drop below 70? Once a week    Adherence Review: Is the patient currently on a STATIN medication? Yes Is the patient currently on ACE/ARB medication? Yes Does the patient have >5 day gap between last estimated fill dates? CPP to review.   Patient states he likes the dexcom and seems to be monitoring his blood sugar more often. Has not had any trouble obtaining readings. There was some uncertainty still about how often he is taking his [name] and dose, asked him to write down all doses on his BG log.  Follow-Up:  Pharmacist Review  Debbora Dus, CPP notified  [name], [place] [facility] Assistant [phone]  I have reviewed the care management and care coordination activities outlined in this encounter and I am certifying that I agree with the content of this note. Discussed with pt the importance of proper treatment of hypoglycemia and taking [name] the same time and dose each day. Follow up in 1 week to review log.  Debbora Dus, PharmD Clinical Pharmacist [facility] at [facility] [phone]  Total time spent for month: 30

## 2020-11-04 NOTE — Chronic Care Management (AMB) (Addendum)
    Chronic Care Management Pharmacy Assistant   Name: Anthony [name]  MRN: [id number] DOB: 03/06/1946  Reason for Encounter: Disease State- Diabetes   Medications: Outpatient Encounter Medications as of 11/01/2020  Medication Sig   amLODipine (NORVASC) 5 MG tablet Take 1 tablet (5 mg total) by mouth daily.   b complex vitamins tablet Take 1 tablet by mouth daily.   Continuous Blood Gluc Transmit (DEXCOM G6 TRANSMITTER) MISC 1 Device by Does not apply route every 3 (three) months.   Dextrose, Diabetic Use, (GLUCOSE PO) Take 1 tablet by mouth as needed.   glucagon (GLUCAGON EMERGENCY) 1 MG injection Inject 1 mg into the muscle once as needed for up to 1 dose.   glucose blood (ONETOUCH ULTRA) test strip USE AS INSTRUCTED 4X A DAY. DAY SUPPLY PER INSURANCE.   insulin aspart (NOVOLOG FLEXPEN) 100 UNIT/ML FlexPen Inject 10-18 Units into the skin 3 (three) times daily with meals. (Patient not taking: No sig reported)   Insulin Pen Needle (ADVOCATE INSULIN PEN NEEDLES) 31G X 5 MM MISC Use 3x a day   metFORMIN (GLUCOPHAGE-XR) 500 MG 24 hr tablet Take 2 tablets (1,000 mg total) by mouth daily with breakfast.   olmesartan-hydrochlorothiazide (BENICAR HCT) 40-25 MG tablet Take 1 tablet by mouth daily.   pantoprazole (PROTONIX) 40 MG tablet Take 1 tablet (40 mg total) by mouth daily.   Semaglutide,0.25 or 0.5MG /DOS, (OZEMPIC, 0.25 OR 0.5 MG/DOSE,) 2 MG/1.5ML SOPN Inject 0.5 mg into the skin once a week.   sildenafil (VIAGRA) 100 MG tablet TAKE 0.5-1 TABLETS (50-100 MG TOTAL) BY MOUTH DAILY AS NEEDED FOR ERECTILE DYSFUNCTION.   simvastatin (ZOCOR) 20 MG tablet TAKE 1 TABLET BY MOUTH EVERYDAY AT BEDTIME (Patient taking differently: Take 20 mg by mouth at bedtime. TAKE 1 TABLET BY MOUTH EVERYDAY AT BEDTIME)   tadalafil (CIALIS) 20 MG tablet Take 0.5-1 tablets (10-20 mg total) by mouth every other day as needed for erectile dysfunction.   traMADol (ULTRAM) 50 MG tablet Take 1 tablet (50 mg total) by mouth  every 6 (six) hours as needed for moderate pain.   TRESIBA FLEXTOUCH 200 UNIT/ML FlexTouch Pen INJECT 32 UNITS INTO THE SKIN DAILY.   No facility-administered encounter medications on file as of 11/01/2020.    Contacted patient to remind him to keep detailed log of his readings and how much insulin he is using. He can not recall how much he used last night. States that his fasting blood sugar on 11/03/20 was 101 and 114 this morning. His afternoon readings was 102 before dinner. Patient also states that he and Dr. Danise Mina discussed using Novolog some if needed. Will follow up with patient next Thursday 11/11/20 to review BG log and insulin usage.   Follow-Up:  Pharmacist Review  Debbora Dus, CPP notified  [name], [place] [phone]  Total time spent for month: 1:16  I have reviewed the care management and care coordination activities outlined in this encounter and I am certifying that I agree with the content of this note. No further action required.  Debbora Dus, PharmD Clinical Pharmacist [facility] at [facility] [phone]

## 2020-11-09 ENCOUNTER — Other Ambulatory Visit: Payer: Self-pay | Admitting: Internal Medicine

## 2020-11-10 ENCOUNTER — Telehealth: Payer: Self-pay | Admitting: Internal Medicine

## 2020-11-10 NOTE — Telephone Encounter (Signed)
Pt advised Ozempic along with diet and exercise can reduce the need for fast acting insulin but it takes time. Pt advised he is happy with the results thus far and will continue with his current regimen to hopefully reduce his insulin.

## 2020-11-10 NOTE — Telephone Encounter (Signed)
Patient called to let Dr. Cruzita Lederer know that Patient's PCP prescribed Patient the Dexcom G6 and Ozempic. Patient states the new medication, etc is not working the way Patient would like for it to and Patient states he is still taking regular insulin after each meal. Patient also states he is going in for Cataract surgery in April 2022.

## 2020-11-11 NOTE — Progress Notes (Addendum)
Recent Relevant Labs: Lab Results  Component Value Date/Time   HGBA1C 8.0 (A) 10/27/2020 10:56 AM   HGBA1C 7.5 (A) 07/27/2020 03:13 PM   HGBA1C 7.7 (H) 03/02/2020 10:26 AM   HGBA1C 7.8 (H) 01/12/2020 10:21 AM   MICROALBUR 6.3 (H) 06/28/2015 09:39 AM   MICROALBUR 4.4 (H) 06/26/2014 09:05 AM    Kidney Function Lab Results  Component Value Date/Time   CREATININE 1.02 03/02/2020 10:26 AM   CREATININE 0.91 01/12/2020 10:21 AM   GFR 81.99 01/12/2020 10:21 AM   GFRNONAA >60 03/02/2020 10:26 AM   GFRAA >60 03/02/2020 10:26 AM   Contacted Anthony [name] for update on his blood sugar log.  Current antihyperglycemic regimen:   ozempic 0.5mg  weekly             tresiba 32 units daily (Patient only taking 24 units)             Metformin XR 1000mg  daily             Novolog (sometimes uses 14 units instead of using tresiba)  Patient denies hypoglycemic symptoms, including Sweaty, Shaky, Hungry, Nervous/irritable and Vision changes   Patient denies hyperglycemic symptoms, including blurry vision, excessive thirst, fatigue, polyuria and weakness   How often are you checking your blood sugar? Patient checking multiple times a day at different times  What are your blood sugars ranging?  Fasting: 184, 198, 163, 190 Before meals:  After meals: 280, 219 Bedtime:   Patient had readings that did not have times associated with them readings are as follows: Tues 11/09/20: 189, 227, 242 (patient took 24 units of Tresiba after this reading to help with high blood sugar mid day), 193 104, 59. Wed 11/10/20: 280, 289 (took 24 units of Tresiba after this reading to help lower BG), 69, 76, 160.   During the week, how often does your blood glucose drop below 70? Twice a week    Follow-Up:  Pharmacist Review  Anthony [name], CPP notified  Anthony [name], [place] Assistant [phone]  I have reviewed the care management and care coordination activities outlined in this encounter and I  am certifying that I agree with the content of this note. Contacted patient and instructed on proper adherence to [name] daily - 24 units and Novolog 10-12 units before meals. Follow up CCM visit on 11/23/20.  Anthony [name], PharmD Clinical Pharmacist [facility] at [facility] [phone]

## 2020-11-13 NOTE — Progress Notes (Signed)
Encounter details: CCM Time Spent       Value Time User   Time spent with patient (minutes)  90 10/15/2020 11:44 AM Debbora Dus, RPH   Time spent performing Chart review  30 10/15/2020 11:44 AM Debbora Dus, [facility]   Total time (minutes)  120 10/15/2020 11:44 AM Debbora Dus, RPH      Moderate to High Complex Decision Making       Value Time User   Moderate to High complex decision making  Yes 10/15/2020 11:44 AM Debbora Dus, [facility]      CCM Services: This encounter meets complex CCM services and moderate to high decision making.  Prior to outreach and patient consent for Chronic Care Management, I referred this patient for services after reviewing the nominated patient list or from a personal encounter with the patient.  I have personally reviewed this encounter including the documentation in this note and have collaborated with the care management provider regarding care management and care coordination activities to include development and update of the comprehensive care plan. I am certifying that I agree with the content of this note and encounter as supervising physician.

## 2020-11-22 ENCOUNTER — Other Ambulatory Visit: Payer: Self-pay | Admitting: Internal Medicine

## 2020-11-22 ENCOUNTER — Telehealth: Payer: Self-pay

## 2020-11-22 MED ORDER — OZEMPIC (1 MG/DOSE) 4 MG/3ML ~~LOC~~ SOPN: 1.0000 mg | PEN_INJECTOR | SUBCUTANEOUS | 3 refills | Status: DC

## 2020-11-22 NOTE — Telephone Encounter (Signed)
Dr. Cruzita Lederer,  Patient is doing well tolerating Ozempic 0.5 mg once weekly. He has been on this dose > 4 weeks. He is still having high BG readings. Would you like to increase to 1 mg weekly? He is using [facility].   Thanks,  Debbora Dus, PharmD Clinical Pharmacist [place] [facility] at [facility] [phone]

## 2020-11-22 NOTE — Telephone Encounter (Signed)
Virgel Bouquet! Yes - I sent the higher dose of Ozempic to [facility]. Sincerely, [name] MD

## 2020-11-23 ENCOUNTER — Ambulatory Visit (INDEPENDENT_AMBULATORY_CARE_PROVIDER_SITE_OTHER): Payer: Medicare HMO

## 2020-11-23 DIAGNOSIS — Z794 Long term (current) use of insulin: Secondary | ICD-10-CM

## 2020-11-23 DIAGNOSIS — I10 Essential (primary) hypertension: Secondary | ICD-10-CM | POA: Diagnosis not present

## 2020-11-23 DIAGNOSIS — E113299 Type 2 diabetes mellitus with mild nonproliferative diabetic retinopathy without macular edema, unspecified eye: Secondary | ICD-10-CM | POA: Diagnosis not present

## 2020-11-23 DIAGNOSIS — H2511 Age-related nuclear cataract, right eye: Secondary | ICD-10-CM | POA: Diagnosis not present

## 2020-11-23 NOTE — Patient Instructions (Signed)
Dear Anthony [name],  Below is a summary of the goals we discussed during our follow up appointment on November 23, 2020. Please contact me anytime with questions or concerns.   Visit Information  Goals Addressed   None    Patient Care Plan: CCM Pharmacy Care Plan    Problem Identified: CHL AMB "PATIENT-SPECIFIC PROBLEM"     Long-Range Goal: Disease Management   Start Date: 11/23/2020  Priority: High  Note:   Conditions to be addressed/monitored:  Diabetes, Hypertension   Current Barriers:  . Unable to achieve control of diabetes  . Non-compliance with insulin regimen  . Frequent hypoglycemia  Pharmacist Clinical Goal(s):  [name] Kitchen Over the next 30 days, patient will achieve adherence to monitoring guidelines and medication adherence to achieve therapeutic efficacy through collaboration with PharmD and provider.   Interventions: . 1:1 collaboration with [name], MD regarding development and update of comprehensive plan of care as evidenced by provider attestation and co-signature . Inter-disciplinary care team collaboration (see longitudinal plan of care) . Comprehensive medication review performed; medication list updated in electronic medical record  Diabetes (A1c goal <7%) -Uncontrolled  -After last PCP visit 10/27/20, pt restarted Novolog and has been taking 10-12 units pre meal. He is also taking 10-12 units as a correction when his Dexcom alerts him of a high BG (> 250). He has stopped his [name] due to concern for low BG as his BG are usually "good" at night and concerned about going low. Encouraged him to continue the [name] daily as it will be impossible to control his BG without background insulin. He agrees to resume [name] 24 units daily. Discouraged correcting BG with Novolog without eating as this often results in lows for him.  -As he has been on Ozempic 0.5 mg > 4 weeks, consulted endo to increase dose to 1 mg. He will finish out 2 more doses first.   -We  discussed he could try taking an additional metformin at bedtime if it doesn't upset his stomach. -Current medications:  Metformin 500 mg - 1 tablet daily at breakfast  Insulin aspart (NovoLog) - 10-12 units 15 minutes before meals  Tresiba - 24 units once daily at bedtime   Ozempic 0.25 mg - Inject once weekly on Mondays -Medications previously tried: upset stomach on higher dose metformin  -He is now wearing Dexcom 24/7 and his son and daughter has access to live readings. He plans to have his son add me to his live Dexcom readings.    -Current meal patterns: three meals per day, he has a low carb meal plan provided by his son  -Current exercise: minimal -Educated on: Prevention and management of hypoglycemic episodes -Recommended taking medications as prescribed. Increase Ozempic to 1 mg weekly starting on 10/29/20 per endo consult. Ria Comment will contact Dexcom DME provider for a refill for patient.  Hypertension Controlled Current medications:  Amlodipine 5 mg - 1 tablet daily (1 breakfast)  Olmesartan-hydrochlorothiazide 40-25 mg - 1 tablet daily (1 breakfast) No home monitoring currently. Denies symptoms of hypo or hypertension. Clinic readings within goal. Confirms adherence. Recommended taking medications as prescribed  Patient Goals/Self-Care Activities . Over the next 30 days, patient will:  - take medications as prescribed -monitor glucose with Dexcom daily, document, and provide at future appointments   Problem Identified: CHL AMB "PATIENT-SPECIFIC PROBLEM"        Pt agrees to receive care plan by mail.  Debbora Dus, PharmD Clinical Pharmacist [facility] [facility] at [facility] [phone]

## 2020-11-23 NOTE — Progress Notes (Signed)
Chronic Care Management Pharmacy Note  11/23/2020 Name:  Anthony [name] MRN:  [id number] DOB:  1946-02-10  Subjective: Anthony [name] is an 75 y.o. year old male who is a primary patient of [name], MD.  The CCM team was consulted for assistance with disease management and care coordination needs.    Engaged with patient by telephone for follow up visit in response to provider referral for pharmacy case management and/or care coordination services.   Consent to Services:  The patient was given information about Chronic Care Management services, agreed to services, and gave verbal consent prior to initiation of services.  Please see initial visit note for detailed documentation.   Patient Care Team: [name], MD as PCP - General (Family Medicine) [name], MD (Neurosurgery) [name], MD as Consulting Physician (General Surgery) [name], MD (Inactive) as Consulting Physician (Urology) Thelma Comp, [place] as Consulting Physician (Optometry) Debbora Dus, [facility] as Pharmacist (Pharmacist)  Office Visits:  10/27/20: PCP - DM review, update metformin dosing, 1000 mg daily, coordinate with endo  07/27/20: Larene Beach called pt to discuss dexcom education, asked pt to download app and call to schedule appt with nurse for education  07/27/20: PCP/Gutierrez - brittle diabetes, schedule follow up with endo, Dexcom sample, self-tapering meds, unclear about Novolog dose   01/20/20: PCP/Gutierrez - suspect right inguinal hernia; numbness in left arm - suggest vitamin B12 trial for 1 month; LDL at goal on simvastatin; GERD on PPI; referral for cancer screening; HTN stable; DM uncontrolled. Encouraged endo f/u; BPH stable, chronic, s/p TURP   Consult Visit:   08/12/20: Endocrinology - take Novolog 15 min before meals, keep log of when BG is high (missed doses, etc), continue Tresiba 32 units daily, metformin ER 500 mg daily, Novolog sliding scale - 12 units small  meals, 16 regular meals, 18 large meals   06/02/20: Podiatry - annual DM foot exam   02/25/20: Pulmonology - lung cancer screening program  Hospital visits: None in previous 6 months  Objective:  Lab Results  Component Value Date   CREATININE 1.02 03/02/2020   BUN 12 03/02/2020   GFR 81.99 01/12/2020   GFRNONAA >60 03/02/2020   GFRAA >60 03/02/2020   NA 139 03/02/2020   K 3.6 03/02/2020   CALCIUM 9.0 03/02/2020   CO2 29 03/02/2020   GLUCOSE 185 (H) 03/02/2020    Lab Results  Component Value Date/Time   HGBA1C 8.0 (A) 10/27/2020 10:56 AM   HGBA1C 7.5 (A) 07/27/2020 03:13 PM   HGBA1C 7.7 (H) 03/02/2020 10:26 AM   HGBA1C 7.8 (H) 01/12/2020 10:21 AM   GFR 81.99 01/12/2020 10:21 AM   GFR 81.20 01/13/2019 10:29 AM   MICROALBUR 6.3 (H) 06/28/2015 09:39 AM   MICROALBUR 4.4 (H) 06/26/2014 09:05 AM    Last diabetic Eye exam:  Lab Results  Component Value Date/Time   HMDIABEYEEXA No Retinopathy 10/12/2020 12:00 AM   HMDIABEYEEXA No Retinopathy 10/12/2020 12:00 AM    Last diabetic Foot exam:  Lab Results  Component Value Date/Time   HMDIABFOOTEX done by podiatry - in chart 05/19/2019 12:00 AM     Lab Results  Component Value Date   CHOL 132 08/31/2020   HDL 43 08/31/2020   LDLCALC 69 08/31/2020   LDLDIRECT 94.0 10/05/2016   TRIG 112 08/31/2020   CHOLHDL 3.1 08/31/2020    Hepatic Function Latest Ref Rng & Units 01/12/2020 01/13/2019 01/01/2018  Total Protein 6.0 - 8.3 g/dL 6.0 5.9(L) 6.0  Albumin 3.5 - 5.2  g/dL 4.1 3.9 4.1  AST 0 - 37 U/L 15 14 13   ALT 0 - 53 U/L 13 14 13   Alk Phosphatase 39 - 117 U/L 93 78 68  Total Bilirubin 0.2 - 1.2 mg/dL 0.8 0.7 0.8  Bilirubin, Direct 0.0 - 0.3 mg/dL - - -   CBC Latest Ref Rng & Units 03/02/2020 01/05/2017 07/21/2013  WBC 4.0 - 10.5 K/uL 8.5 5.7 6.1  Hemoglobin 13.0 - 17.0 g/dL 14.5 14.2 15.5  Hematocrit 39.0 - 52.0 % 42.8 41.3 43.6  Platelets 150 - 400 K/uL 227 209.0 204    No results found for: VD25OH  Clinical ASCVD: Yes   The 10-year ASCVD risk score Mikey Bussing DC Jr., et al., 2013) is: 40.1%   Values used to calculate the score:     Age: 77 years     Sex: Male     Is Non-Hispanic African American: No     Diabetic: Yes     Tobacco smoker: No     Systolic Blood Pressure: 454 mmHg     Is BP treated: Yes     HDL Cholesterol: 43 mg/dL     Total Cholesterol: 132 mg/dL    Depression screen [facility] 2/9 01/12/2020 01/09/2019 01/01/2018  Decreased Interest 0 0 0  Down, Depressed, Hopeless 0 0 0  PHQ - 2 Score 0 0 0  Altered sleeping 0 0 0  Tired, decreased energy 0 0 0  Change in appetite 0 0 0  Feeling bad or failure about yourself  0 0 0  Trouble concentrating 0 0 0  Moving slowly or fidgety/restless 0 0 0  Suicidal thoughts 0 0 0  PHQ-9 Score 0 0 0  Difficult doing work/chores Not difficult at all Not difficult at all Not difficult at all  Some recent data might be hidden      Social History   Tobacco Use  Smoking Status Former Smoker  . Packs/day: 0.30  . Years: 50.00  . Pack years: 15.00  . Types: Cigarettes  . Quit date: 08/29/2011  . Years since quitting: 9.2  Smokeless Tobacco Never Used   BP Readings from Last 3 Encounters:  10/27/20 136/78  08/31/20 (!) 148/70  08/12/20 130/82   Pulse Readings from Last 3 Encounters:  10/27/20 86  08/31/20 83  08/12/20 89   Wt Readings from Last 3 Encounters:  10/27/20 210 lb 7 oz (95.5 kg)  08/31/20 219 lb (99.3 kg)  08/12/20 218 lb 3.2 oz (99 kg)   BMI Readings from Last 3 Encounters:  10/27/20 28.54 kg/m  08/31/20 29.70 kg/m  08/12/20 29.59 kg/m    Assessment/Interventions: Review of patient past medical history, allergies, medications, health status, including review of consultants reports, laboratory and other test data, was performed as part of comprehensive evaluation and provision of chronic care management services.   SDOH:  (Social Determinants of Health) assessments and interventions performed: Yes  SDOH Screenings   Alcohol  Screen: Low Risk   . Last Alcohol Screening Score (AUDIT): 4  Depression (PHQ2-9): Low Risk   . PHQ-2 Score: 0  Financial Resource Strain: Low Risk   . Difficulty of Paying Living Expenses: Not very hard  Food Insecurity: No Food Insecurity  . Worried About Charity fundraiser in the Last Year: Never true  . Ran Out of Food in the Last Year: Never true  Housing: Low Risk   . Last Housing Risk Score: 0  Physical Activity: Inactive  . Days of Exercise per Week:  0 days  . Minutes of Exercise per Session: 0 min  Social Connections: Not on file  Stress: No Stress Concern Present  . Feeling of Stress : Not at all  Tobacco Use: Medium Risk  . Smoking Tobacco Use: Former Smoker  . Smokeless Tobacco Use: Never Used  Transportation Needs: No Transportation Needs  . Lack of Transportation (Medical): No  . Lack of Transportation (Non-Medical): No    CCM Care Plan  No Known Allergies  Medications Reviewed Today    Reviewed by Debbora Dus, [facility] (Pharmacist) on 11/23/20 at 1341  Med List Status: <None>  Medication Order Taking? Sig Documenting Provider Last Dose Status Informant  amLODipine (NORVASC) 5 MG tablet [id number] Yes Take 1 tablet (5 mg total) by mouth daily. [name], MD Taking Active Self  b complex vitamins tablet [id number] Yes Take 1 tablet by mouth daily. [provider] Taking Active Self  Continuous Blood Gluc Transmit (DEXCOM G6 TRANSMITTER) MISC [id number] Yes 1 Device by Does not apply route every 3 (three) months. [name], MD Taking Active   Dextrose, Diabetic Use, (GLUCOSE PO) [id number] Yes Take 1 tablet by mouth as needed. [provider] Taking Active Self  glucagon (GLUCAGON EMERGENCY) 1 MG injection [id number] Yes Inject 1 mg into the muscle once as needed for up to 1 dose. [name], MD Taking Active Self  glucose blood [facility] ULTRA) test strip [id number] Yes USE AS INSTRUCTED 4X A DAY. DAY SUPPLY PER INSURANCE. [name], MD Taking Active   insulin aspart (NOVOLOG FLEXPEN) 100 UNIT/ML FlexPen [id number] Yes Inject 10-18 Units into the skin 3 (three) times daily with meals. [name], MD Taking Active   Insulin Pen Needle (ADVOCATE INSULIN PEN NEEDLES) 31G X 5 MM MISC [id number] Yes Use 3x a day [name], MD Taking Active Self  metFORMIN (GLUCOPHAGE-XR) 500 MG 24 hr tablet [id number] Yes TAKE 2 TABLETS (1,000 MG TOTAL) BY MOUTH DAILY. TAKE 2 TABLETS BY MOUTH TWICE DAILY WITH MEALS  Patient taking differently: 500 mg daily with breakfast.   [name], MD Taking Active Self  olmesartan-hydrochlorothiazide (BENICAR HCT) 40-25 MG tablet [id number] Yes Take 1 tablet by mouth daily. [name], MD Taking Active Self  pantoprazole (PROTONIX) 40 MG tablet [id number] Yes Take 1 tablet (40 mg total) by mouth daily. [name], MD Taking Active Self  Semaglutide, 1 MG/DOSE, (OZEMPIC, 1 MG/DOSE,) 4 MG/3ML SOPN [id number] Yes Inject 1 mg into the skin once a week. [name], MD Taking Active   simvastatin (ZOCOR) 20 MG tablet [id number] Yes TAKE 1 TABLET BY MOUTH EVERYDAY AT BEDTIME  Patient taking differently: Take 20 mg by mouth at bedtime. TAKE 1 TABLET BY MOUTH EVERYDAY AT BEDTIME   [name], MD Taking Active Self  tadalafil (CIALIS) 20 MG tablet [id number] Yes Take 0.5-1 tablets (10-20 mg total) by mouth every other day as needed for erectile dysfunction. [name], MD Taking Active   traMADol [name]) 50 MG tablet [id number] No Take 1 tablet (50 mg total) by mouth every 6 (six) hours as needed for moderate pain.  Patient not taking: Reported on 11/23/2020   [name], MD Not Taking Active   TRESIBA FLEXTOUCH 200 UNIT/ML FlexTouch Pen [id number] Yes INJECT 32 UNITS INTO THE SKIN DAILY. [name], MD Taking Active           Patient Active Problem List   Diagnosis Date Noted  . Non-adherence to medical treatment 10/27/2020  . COPD (chronic  obstructive pulmonary disease) ([place]) 02/28/2020  .  Coronary artery calcification seen on CAT scan 02/28/2020  . Aortic atherosclerosis ([place]) 02/28/2020  . Ex-smoker 01/21/2020  . RLQ abdominal pain 01/21/2020  . Numbness and tingling in left arm 01/21/2020  . Health maintenance examination 01/05/2017  . Right knee pain 01/18/2016  . Barrett's esophagus 12/08/2015  . Abnormal tympanic membrane of right ear 07/05/2015  . Advanced care planning/counseling discussion 06/30/2014  . Medicare annual wellness visit, subsequent 06/22/2013  . Benign prostatic hyperplasia with urinary obstruction 06/18/2013  . ED (erectile dysfunction) 04/10/2012  . Hyperlipidemia associated with type 2 diabetes mellitus ([place]) 12/29/2008  . LEG PAIN, BILATERAL 11/19/2008  . 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 ANXIETY 07/24/2007  . Hypertension 07/24/2007  . GERD 07/24/2007    Immunization History  Administered Date(s) Administered  . Fluad Quad(high Dose 65+) 06/21/2020  . Influenza Whole 06/02/2008, 06/02/2009, 06/02/2010, 07/08/2011, 07/08/2012  . Influenza, High Dose Seasonal PF 05/18/2017, 06/25/2018, 04/18/2019  . Influenza,inj,Quad PF,6+ Mos 07/05/2015  . Influenza-Unspecified 05/28/2013, 05/28/2014, 06/30/2016  . PFIZER(Purple Top)SARS-COV-2 Vaccination 10/27/2019, 11/25/2019  . Pneumococcal Conjugate-13 06/18/2013  . Pneumococcal Polysaccharide-23 07/08/2011, 12/29/2016  . Tetanus 06/18/2013  . Zoster 08/26/2014  . Zoster Recombinat (Shingrix) 05/18/2017, 01/14/2018    Conditions to be addressed/monitored:  Hypertension and Diabetes  Care Plan : [place]  Updates made by Debbora Dus, [facility] since 11/23/2020 12:00 AM    Problem: CHL AMB "PATIENT-SPECIFIC PROBLEM"     Long-Range Goal: Disease Management   Start Date: 11/23/2020  Priority: High  Note:   Conditions to be addressed/monitored:  Diabetes,  Hypertension   Current Barriers:  . Unable to achieve control of diabetes  . Non-compliance with insulin regimen  . Frequent hypoglycemia  Pharmacist Clinical Goal(s):  [name] Kitchen Over the next 30 days, patient will achieve adherence to monitoring guidelines and medication adherence to achieve therapeutic efficacy through collaboration with PharmD and provider.   Interventions: . 1:1 collaboration with [name], MD regarding development and update of comprehensive plan of care as evidenced by provider attestation and co-signature . Inter-disciplinary care team collaboration (see longitudinal plan of care) . Comprehensive medication review performed; medication list updated in electronic medical record  Diabetes (A1c goal <7%) -Uncontrolled  -After last PCP visit 10/27/20, pt restarted Novolog and has been taking 10-12 units pre meal. He is also taking 10-12 units as a correction when his Dexcom alerts him of a high BG (> 250). He has stopped his [name] due to concern for low BG as his BG are usually "good" at night and concerned about going low. Encouraged him to continue the [name] daily as it will be impossible to control his BG without background insulin. He agrees to resume [name] 24 units daily. Discouraged correcting BG with Novolog without eating as this often results in lows for him.  -As he has been on Ozempic 0.5 mg > 4 weeks, consulted endo to increase dose to 1 mg. He will finish out 2 more doses first.   -We discussed he could try taking an additional metformin at bedtime if it doesn't upset his stomach. -Current medications:  Metformin 500 mg - 1 tablet daily at breakfast  Insulin aspart (NovoLog) - 10-12 units 15 minutes before meals  Tresiba - 24 units once daily at bedtime   Ozempic 0.25 mg - Inject once weekly on Mondays -Medications previously tried: upset stomach on higher dose metformin  -He is now wearing Dexcom 24/7 and his son and daughter has access  to live  readings. He plans to have his son add me to his live Dexcom readings.    -Current meal patterns: three meals per day, he has a low carb meal plan provided by his son  -Current exercise: minimal -Educated on: Prevention and management of hypoglycemic episodes -Recommended taking medications as prescribed. Increase Ozempic to 1 mg weekly starting on 10/29/20 per endo consult. Ria Comment will contact Dexcom DME provider for a refill for patient.  Hypertension Controlled Current medications:  Amlodipine 5 mg - 1 tablet daily (1 breakfast)  Olmesartan-hydrochlorothiazide 40-25 mg - 1 tablet daily (1 breakfast) No home monitoring currently. Denies symptoms of hypo or hypertension. Clinic readings within goal. Confirms adherence. Recommended taking medications as prescribed  Patient Goals/Self-Care Activities . Over the next 30 days, patient will:  - take medications as prescribed -monitor glucose with Dexcom daily, document, and provide at future appointments   Problem: CHL AMB "PATIENT-SPECIFIC PROBLEM"        Medication Assistance: None required.  Patient affirms current coverage meets needs.  Patient's preferred pharmacy is:  [facility] - [place], [place] - [address] [place] [address] Phone: [phone] Fax: [phone]  Uses pill box? Yes  Endorses 100% compliance He is in process of medication sync - anticipated sync date 01/21/21  Medication Reviewed:  Amlodipine 5 mg - 1 tablet daily (1 breakfast)  Metformin 500 mg XR - 1 tablet daily (1 breakfast)  Olmesartan-hydrochlorothiazide 40-25 mg - 1 tablet daily (1 breakfast)  Pantoprazole 40 mg - 1 tablet daily (1 breakfast)  Simvastatin 20 mg - 1 tablet daily (1 breakfast)  Ozempic 0.5 mg - Inject once weekly on Monday  Tresiba - Inject 24 units at bedtime  Novolog - Inject 10-12 units before meals three times daily  Tadalafil 20 mg - takes as  needed  B-Complex - 1 tablet daily (1 breakfast)  Affirms adherence to above medications. He will need refills on meds next month so scheduled adherence call with CMA for end of April.  Follow Up Plan: Endocrinology visit on 12/16/20, CMA call next month to review medication adherence   Medication Assistance: None required.  Patient affirms current coverage meets needs.  Patient's preferred pharmacy is:  [facility] and Follow Up Patient Decision:  Patient agrees to Care Plan and Follow-up.  Debbora Dus, PharmD Clinical Pharmacist [facility] at [facility] [phone]

## 2020-11-23 NOTE — Chronic Care Management (AMB) (Addendum)
Acute form for Ozempic 1 mg has been sent to [facility]. Delivery scheduled for 11/30/20.  Follow-Up:  Comptroller and Pharmacist Review  Debbora Dus, CPP notified  [name], [place] Pharmacy Assistant [phone]

## 2020-11-23 NOTE — Chronic Care Management (AMB) (Addendum)
Contacted [facility] to coordinate patient care, ensure all medications are synced and delivered through [facility].  Follow-Up:  Comptroller and Pharmacist Review  Debbora Dus, CPP notified  [name], [place] Pharmacy Assistant [phone]

## 2020-11-23 NOTE — Chronic Care Management (AMB) (Addendum)
Patient requested refill on Dexcom supplies. [facility] services and was told that the patient will have to contact them directly to request refill. They can not put him on auto refill due to him have a medicare replacement product. Patient made aware and was given the number to [employer] customer service: [phone].   Follow-Up:  Pharmacist Review  Debbora Dus, CPP notified  [name], [place] Pharmacy Assistant [phone]

## 2020-11-24 ENCOUNTER — Other Ambulatory Visit: Payer: Self-pay

## 2020-11-24 ENCOUNTER — Encounter: Payer: Self-pay | Admitting: Ophthalmology

## 2020-11-25 DIAGNOSIS — Z794 Long term (current) use of insulin: Secondary | ICD-10-CM | POA: Diagnosis not present

## 2020-11-25 DIAGNOSIS — E113291 Type 2 diabetes mellitus with mild nonproliferative diabetic retinopathy without macular edema, right eye: Secondary | ICD-10-CM | POA: Diagnosis not present

## 2020-11-25 NOTE — Progress Notes (Signed)
Encounter details: CCM Time Spent       Value Time User   Time spent with patient (minutes)  120 11/23/2020  1:59 PM Debbora Dus, [facility]   Time spent performing Chart review  30 11/23/2020  1:59 PM Debbora Dus, [facility]   Total time (minutes)  150 11/23/2020  1:59 PM Debbora Dus, RPH      Moderate to High Complex Decision Making       Value Time User   Moderate to High complex decision making  Yes 11/23/2020  1:59 PM Debbora Dus, [facility]      CCM Services: This encounter meets complex CCM services and moderate to high decision making.  Prior to outreach and patient consent for Chronic Care Management, I referred this patient for services after reviewing the nominated patient list or from a personal encounter with the patient.  I have personally reviewed this encounter including the documentation in this note and have collaborated with the care management provider regarding care management and care coordination activities to include development and update of the comprehensive care plan. I am certifying that I agree with the content of this note and encounter as supervising physician.

## 2020-11-29 ENCOUNTER — Other Ambulatory Visit: Payer: Self-pay

## 2020-11-29 ENCOUNTER — Other Ambulatory Visit
Admission: RE | Admit: 2020-11-29 | Discharge: 2020-11-29 | Disposition: A | Discharge status: Home / Self Care | Payer: Medicare HMO | Source: Ambulatory Visit | Attending: Ophthalmology | Admitting: Ophthalmology

## 2020-11-29 DIAGNOSIS — Z20822 Contact with and (suspected) exposure to covid-19: Secondary | ICD-10-CM | POA: Insufficient documentation

## 2020-11-29 DIAGNOSIS — Z01812 Encounter for preprocedural laboratory examination: Secondary | ICD-10-CM | POA: Diagnosis not present

## 2020-11-29 LAB — SARS CORONAVIRUS 2 (TAT 6-24 HRS): SARS Coronavirus 2: NEGATIVE

## 2020-11-29 NOTE — Discharge Instructions (Signed)
General Anesthesia, Adult, Care After °This sheet gives you information about how to care for yourself after your procedure. Your health care provider may also give you more specific instructions. If you have problems or questions, contact your health care provider. °What can I expect after the procedure? °After the procedure, the following side effects are common: °· Pain or discomfort at the IV site. °· Nausea. °· Vomiting. °· Sore throat. °· Trouble concentrating. °· Feeling cold or chills. °· Feeling weak or tired. °· Sleepiness and fatigue. °· Soreness and body aches. These side effects can affect parts of the body that were not involved in surgery. °Follow these instructions at home: °For the time period you were told by your health care provider: °· Rest. °· Do not participate in activities where you could fall or become injured. °· Do not drive or use machinery. °· Do not drink alcohol. °· Do not take sleeping pills or medicines that cause drowsiness. °· Do not make important decisions or sign legal documents. °· Do not take care of children on your own.   °Eating and drinking °· Follow any instructions from your health care provider about eating or drinking restrictions. °· When you feel hungry, start by eating small amounts of foods that are soft and easy to digest (bland), such as toast. Gradually return to your regular diet. °· Drink enough fluid to keep your urine pale yellow. °· If you vomit, rehydrate by drinking water, juice, or clear broth. °General instructions °· If you have sleep apnea, surgery and certain medicines can increase your risk for breathing problems. Follow instructions from your health care provider about wearing your sleep device: °? Anytime you are sleeping, including during daytime naps. °? While taking prescription pain medicines, sleeping medicines, or medicines that make you drowsy. °· Have a responsible adult stay with you for the time you are told. It is important to have  someone help care for you until you are awake and alert. °· Return to your normal activities as told by your health care provider. Ask your health care provider what activities are safe for you. °· Take over-the-counter and prescription medicines only as told by your health care provider. °· If you smoke, do not smoke without supervision. °· Keep all follow-up visits as told by your health care provider. This is important. °Contact a health care provider if: °· You have nausea or vomiting that does not get better with medicine. °· You cannot eat or drink without vomiting. °· You have pain that does not get better with medicine. °· You are unable to pass urine. °· You develop a skin rash. °· You have a fever. °· You have redness around your IV site that gets worse. °Get help right away if: °· You have difficulty breathing. °· You have chest pain. °· You have blood in your urine or stool, or you vomit blood. °Summary °· After the procedure, it is common to have a sore throat or nausea. It is also common to feel tired. °· Have a responsible adult stay with you for the time you are told. It is important to have someone help care for you until you are awake and alert. °· When you feel hungry, start by eating small amounts of foods that are soft and easy to digest (bland), such as toast. Gradually return to your regular diet. °· Drink enough fluid to keep your urine pale yellow. °· Return to your normal activities as told by your health care provider.   Ask your health care provider what activities are safe for you. °This information is not intended to replace advice given to you by your health care provider. Make sure you discuss any questions you have with your health care provider. °Document Revised: 04/29/2020 Document Reviewed: 11/27/2019 °Elsevier Patient Education © 2021 [employer] ° °Cataract Surgery, Care After °This sheet gives you information about how to care for yourself after your procedure. Your health  care provider may also give you more specific instructions. If you have problems or questions, contact your health care provider. °What can I expect after the procedure? °After the procedure, it is common to have: °· Itching. °· Discomfort. °· Fluid discharge. °· Sensitivity to light and to touch. °· Bruising in or around the eye. °· Mild blurred vision. °Follow these instructions at home: °Eye care °· Do not touch or rub your eyes. °· Protect your eyes as told by your health care provider. You may be told to wear a protective eye shield or sunglasses. °· Do not put a contact lens into the affected eye or eyes until your health care provider approves. °· Keep the area around your eye clean and dry: °? Avoid swimming. °? Do not allow water to hit you directly in the face while showering. °? Keep soap and shampoo out of your eyes. °· Check your eye every day for signs of infection. Watch for: °? Redness, swelling, or pain. °? Fluid, blood, or pus. °? Warmth. °? A bad smell. °? Vision that is getting worse. °? Sensitivity that is getting worse.   °Activity °· Do not drive for 24 hours if you were given a sedative during your procedure. °· Avoid strenuous activities, such as playing contact sports, for as long as told by your health care provider. °· Do not drive or use heavy machinery until your health care provider approves. °· Do not bend or lift heavy objects. Bending increases pressure in the eye. You can walk, climb stairs, and do light household chores. °· Ask your health care provider when you can return to work. If you work in a dusty environment, you may be advised to wear protective eyewear for a period of time. °General instructions °· Take or apply over-the-counter and prescription medicines only as told by your health care provider. This includes eye drops. °· Keep all follow-up visits as told by your health care provider. This is important. °Contact a health care provider if: °· You have increased  bruising around your eye. °· You have pain that is not helped with medicine. °· You have a fever. °· You have redness, swelling, or pain in your eye. °· You have fluid, blood, or pus coming from your incision. °· Your vision gets worse. °· Your sensitivity to light gets worse. °Get help right away if: °· You have sudden loss of vision. °· You see flashes of light or spots (floaters). °· You have severe eye pain. °· You develop nausea or vomiting. °Summary °· After your procedure, it is common to have itching, discomfort, bruising, fluid discharge, or sensitivity to light. °· Follow instructions from your health care provider about caring for your eye after the procedure. °· Do not rub your eye after the procedure. You may need to wear eye protection or sunglasses. Do not wear contact lenses. Keep the area around your eye clean and dry. °· Avoid activities that require a lot of effort. These include playing sports and lifting heavy objects. °· Contact a health care provider if   you have increased bruising, pain that does not go away, or a fever. Get help right away if you suddenly lose your vision, see flashes of light or spots, or have severe pain in the eye. °This information is not intended to replace advice given to you by your health care provider. Make sure you discuss any questions you have with your health care provider. °Document Revised: 06/10/2019 Document Reviewed: 02/11/2018 °Elsevier Patient Education © 2021 Elsevier Inc. ° °

## 2020-12-01 ENCOUNTER — Ambulatory Visit: Payer: Medicare HMO | Admitting: Anesthesiology

## 2020-12-01 ENCOUNTER — Ambulatory Visit
Admission: RE | Admit: 2020-12-01 | Discharge: 2020-12-01 | Disposition: A | Discharge status: Home / Self Care | Payer: Medicare HMO | Source: Ambulatory Visit | Attending: Ophthalmology | Admitting: Ophthalmology

## 2020-12-01 ENCOUNTER — Encounter: Payer: Self-pay | Admitting: Ophthalmology

## 2020-12-01 ENCOUNTER — Encounter
Admission: RE | Disposition: A | Discharge status: Home / Self Care | Payer: Self-pay | Source: Ambulatory Visit | Attending: Ophthalmology

## 2020-12-01 ENCOUNTER — Other Ambulatory Visit: Payer: Self-pay

## 2020-12-01 DIAGNOSIS — Z807 Family history of other malignant neoplasms of lymphoid, hematopoietic and related tissues: Secondary | ICD-10-CM | POA: Insufficient documentation

## 2020-12-01 DIAGNOSIS — Z79899 Other long term (current) drug therapy: Secondary | ICD-10-CM | POA: Insufficient documentation

## 2020-12-01 DIAGNOSIS — Z800 Family history of malignant neoplasm of digestive organs: Secondary | ICD-10-CM | POA: Diagnosis not present

## 2020-12-01 DIAGNOSIS — Z794 Long term (current) use of insulin: Secondary | ICD-10-CM | POA: Diagnosis not present

## 2020-12-01 DIAGNOSIS — Z811 Family history of alcohol abuse and dependence: Secondary | ICD-10-CM | POA: Insufficient documentation

## 2020-12-01 DIAGNOSIS — Z833 Family history of diabetes mellitus: Secondary | ICD-10-CM | POA: Diagnosis not present

## 2020-12-01 DIAGNOSIS — Z7984 Long term (current) use of oral hypoglycemic drugs: Secondary | ICD-10-CM | POA: Diagnosis not present

## 2020-12-01 DIAGNOSIS — H2511 Age-related nuclear cataract, right eye: Secondary | ICD-10-CM | POA: Diagnosis not present

## 2020-12-01 DIAGNOSIS — Z8249 Family history of ischemic heart disease and other diseases of the circulatory system: Secondary | ICD-10-CM | POA: Diagnosis not present

## 2020-12-01 DIAGNOSIS — Z809 Family history of malignant neoplasm, unspecified: Secondary | ICD-10-CM | POA: Diagnosis not present

## 2020-12-01 DIAGNOSIS — H25811 Combined forms of age-related cataract, right eye: Secondary | ICD-10-CM | POA: Diagnosis not present

## 2020-12-01 DIAGNOSIS — Z87891 Personal history of nicotine dependence: Secondary | ICD-10-CM | POA: Insufficient documentation

## 2020-12-01 DIAGNOSIS — R69 Illness, unspecified: Secondary | ICD-10-CM | POA: Diagnosis not present

## 2020-12-01 HISTORY — PX: CATARACT EXTRACTION W/PHACO: SHX586

## 2020-12-01 LAB — GLUCOSE, CAPILLARY
Glucose-Capillary: 60 mg/dL — ABNORMAL LOW (ref 70–99)
Glucose-Capillary: 42 mg/dL — CL (ref 70–99)
Glucose-Capillary: 69 mg/dL — ABNORMAL LOW (ref 70–99)

## 2020-12-01 SURGERY — PHACOEMULSIFICATION, CATARACT, WITH IOL INSERTION
Anesthesia: Monitor Anesthesia Care | Site: Eye | Laterality: Right

## 2020-12-01 MED ORDER — ONDANSETRON HCL 4 MG/2ML IJ SOLN: 4.0000 mg | Freq: Once | INTRAMUSCULAR | Status: DC | PRN

## 2020-12-01 MED ORDER — DEXTROSE 50 % IV SOLN
12.5000 mL | Freq: Once | INTRAVENOUS | Status: AC
  Administered 2020-12-01: 12.5 mL via INTRAVENOUS

## 2020-12-01 MED ORDER — ARMC OPHTHALMIC DILATING DROPS
1.0000 "application " | OPHTHALMIC | Status: DC | PRN
  Administered 2020-12-01 (×3): 1 via OPHTHALMIC

## 2020-12-01 MED ORDER — EPINEPHRINE PF 1 MG/ML IJ SOLN
INTRAOCULAR | Status: DC | PRN
  Administered 2020-12-01: 73 mL via OPHTHALMIC

## 2020-12-01 MED ORDER — BRIMONIDINE TARTRATE-TIMOLOL 0.2-0.5 % OP SOLN
OPHTHALMIC | Status: DC | PRN
  Administered 2020-12-01: 1 [drp] via OPHTHALMIC

## 2020-12-01 MED ORDER — FENTANYL CITRATE (PF) 100 MCG/2ML IJ SOLN
INTRAMUSCULAR | Status: DC | PRN
  Administered 2020-12-01: 50 ug via INTRAVENOUS

## 2020-12-01 MED ORDER — NA HYALUR & NA CHOND-NA HYALUR 0.4-0.35 ML IO KIT
PACK | INTRAOCULAR | Status: DC | PRN
  Administered 2020-12-01: 1 mL via INTRAOCULAR

## 2020-12-01 MED ORDER — TETRACAINE HCL 0.5 % OP SOLN
1.0000 [drp] | OPHTHALMIC | Status: DC | PRN
  Administered 2020-12-01 (×2): 1 [drp] via OPHTHALMIC

## 2020-12-01 MED ORDER — ACETAMINOPHEN 325 MG PO TABS
325.0000 mg | ORAL_TABLET | ORAL | Status: DC | PRN
Start: 2020-12-01 — End: 2020-12-01

## 2020-12-01 MED ORDER — CEFUROXIME OPHTHALMIC INJECTION 1 MG/0.1 ML
INJECTION | OPHTHALMIC | Status: DC | PRN
  Administered 2020-12-01: 0.1 mL via INTRACAMERAL

## 2020-12-01 MED ORDER — LIDOCAINE HCL (PF) 2 % IJ SOLN
INTRAOCULAR | Status: DC | PRN
  Administered 2020-12-01: 1 mL

## 2020-12-01 MED ORDER — ACETAMINOPHEN 160 MG/5ML PO SOLN: 325.0000 mg | ORAL | Status: DC | PRN

## 2020-12-01 MED ORDER — MIDAZOLAM HCL 2 MG/2ML IJ SOLN
INTRAMUSCULAR | Status: DC | PRN
  Administered 2020-12-01 (×2): .5 mg via INTRAVENOUS

## 2020-12-01 SURGICAL SUPPLY — 29 items
CANNULA ANT/CHMB 27G (MISCELLANEOUS) ×1 IMPLANT
CANNULA ANT/CHMB 27GA (MISCELLANEOUS) ×2 IMPLANT
GLOVE SURG LX 7.5 STRW (GLOVE) ×1
GLOVE SURG LX STRL 7.5 STRW (GLOVE) ×1 IMPLANT
GLOVE SURG TRIUMPH 8.0 PF LTX (GLOVE) ×2 IMPLANT
GOWN STRL REUS W/ TWL LRG LVL3 (GOWN DISPOSABLE) ×2 IMPLANT
GOWN STRL REUS W/TWL LRG LVL3 (GOWN DISPOSABLE) ×4
LENS IOL ACRSF VT TRC 315 22.0 IMPLANT
LENS IOL ACRYSOF VIVITY 22.0 ×2 IMPLANT
LENS IOL VIVITY 315 22.0 ×1 IMPLANT
MARKER SKIN DUAL TIP RULER LAB (MISCELLANEOUS) ×2 IMPLANT
NDL CAPSULORHEX 25GA (NEEDLE) ×1 IMPLANT
NDL RETROBULBAR .5 NSTRL (NEEDLE) IMPLANT
NEEDLE CAPSULORHEX 25GA (NEEDLE) ×2 IMPLANT
NEEDLE FILTER BLUNT 18X 1/2SAF (NEEDLE) ×2
NEEDLE FILTER BLUNT 18X1 1/2 (NEEDLE) ×2 IMPLANT
PACK CATARACT BRASINGTON (MISCELLANEOUS) ×2 IMPLANT
PACK EYE AFTER SURG (MISCELLANEOUS) ×2 IMPLANT
PACK OPTHALMIC (MISCELLANEOUS) ×2 IMPLANT
RING MALYGIN 7.0 (MISCELLANEOUS) IMPLANT
SOLUTION OPHTHALMIC SALT (MISCELLANEOUS) ×2 IMPLANT
SUT ETHILON 10-0 CS-B-6CS-B-6 (SUTURE)
SUT VICRYL  9 0 (SUTURE)
SUT VICRYL 9 0 (SUTURE) IMPLANT
SUTURE EHLN 10-0 CS-B-6CS-B-6 (SUTURE) IMPLANT
SYR 3ML LL SCALE MARK (SYRINGE) ×4 IMPLANT
SYR TB 1ML LUER SLIP (SYRINGE) ×2 IMPLANT
WATER STERILE IRR 250ML POUR (IV SOLUTION) ×2 IMPLANT
WIPE NON LINTING 3.25X3.25 (MISCELLANEOUS) ×2 IMPLANT

## 2020-12-01 NOTE — Op Note (Signed)
LOCATION:  [facility]   PREOPERATIVE DIAGNOSIS:  Nuclear sclerotic cataract of the right eye.  H25.11   POSTOPERATIVE DIAGNOSIS:  Nuclear sclerotic cataract of the right eye.   PROCEDURE:  Phacoemulsification with Toric posterior chamber intraocular lens placement of the right eye.  Ultrasound time: Procedure(s) with comments: CATARACT EXTRACTION PHACO AND INTRAOCULAR LENS PLACEMENT (IOC)  RIGHT VIVITY TORIC LENS DIABETIC (Right) - 6.93 1:12.8 9.5%  LENS:   Implant Name Type Inv. Item Serial No. Manufacturer Lot No. LRB No. Used Action  LENS IOL ACRYSOF VIVITY 22.0 - [id number]  LENS IOL ACRYSOF VIVITY 22.0 [id number] ALCON  Right 1 Implanted     [id number] 22.0 D Vivity Toric intraocular lens with 1.5 diopters of cylindrical power with axis orientation at 175 degrees.     SURGEON:  Wyonia Hough, MD   ANESTHESIA: Topical with tetracaine drops and 2% Xylocaine jelly, augmented with 1% preservative-free intracameral lidocaine. .   COMPLICATIONS:  None.   DESCRIPTION OF PROCEDURE:  The patient was identified in the holding room and transported to the operating suite and placed in the supine position under the operating microscope.  The right eye was identified as the operative eye, and it was prepped and draped in the usual sterile ophthalmic fashion.    A clear-corneal paracentesis incision was made at the 12:00 position.  0.5 ml of preservative-free 1% lidocaine was injected into the anterior chamber. The anterior chamber was filled with Viscoat.  A 2.4 millimeter near clear corneal incision was then made at the 9:00 position.  A cystotome and capsulorrhexis forceps were then used to make a curvilinear capsulorrhexis.  Hydrodissection and hydrodelineation were then performed using balanced salt solution.   Phacoemulsification was then used in stop and chop fashion to remove the lens, nucleus and epinucleus.  The remaining cortex was aspirated using the irrigation and  aspiration handpiece.  Provisc viscoelastic was then placed into the capsular bag to distend it for lens placement.  The Verion digital marker was used to align the implant at the intended axis.   A Toric lens was then injected into the capsular bag.  It was rotated clockwise until the axis marks on the lens were approximately 15 degrees in the counterclockwise direction to the intended alignment.  The viscoelastic was aspirated from the eye using the irrigation aspiration handpiece.  Then, a Koch spatula through the sideport incision was used to rotate the lens in a clockwise direction until the axis markings of the intraocular lens were lined up with the Verion alignment.  Balanced salt solution was then used to hydrate the wounds. Cefuroxime 0.1 ml of a 10mg /ml solution was injected into the anterior chamber for a dose of 1 mg of intracameral antibiotic at the completion of the case.    The eye was noted to have a physiologic pressure and there was no wound leak noted.   Timolol and Brimonidine drops were applied to the eye.  The patient was taken to the recovery room in stable condition having had no complications of anesthesia or surgery.  Shakea Isip 12/01/2020, 1:15 PM

## 2020-12-01 NOTE — H&P (Signed)
[facility]   Primary Care Physician:  [name], MD Ophthalmologist: Dr. Leandrew Koyanagi  Pre-Procedure History & Physical: HPI:  Anthony [name] is a 75 y.o. male here for ophthalmic surgery.   Past Medical History:  Diagnosis Date  . Anxiety    denies  . Arthritis    a little  . Barrett's esophagus 12/08/2015   By EGD 2010 [name])   . BPH with obstruction/lower urinary tract symptoms 2014   s/p TURP  . Depression    denies  . DKA (diabetic ketoacidoses) 01/2010   "in coma"  . GERD (gastroesophageal reflux disease)   . H/O bronchitis    after every cold  . HTN (hypertension)   . Hyperlipidemia    controlled with medicine  . Otitis media   . Rheumatic fever    maybe as child  . Type 2 diabetes, uncontrolled, with mild 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 macular edema ([name])    completed DMSE    Past Surgical History:  Procedure Laterality Date  . ABI  12/2013   WNL  . APPENDECTOMY    . CHOLECYSTECTOMY     years ago  . COLONOSCOPY  01/2009   TA, rec rpt 5 yrs [name])  . COLONOSCOPY  01/2018   TAx2, diverticulosis, int hemorrhoids, rpt 5 yrs ([name])  . ESOPHAGOGASTRODUODENOSCOPY  01/2009   biopsy with Barrett's  . ESOPHAGOGASTRODUODENOSCOPY  01/2018   WNL - no barrett's ([name])  . INGUINAL HERNIA REPAIR Bilateral 03/04/2020   Procedure: LAPAROSCOPIC BILATERAL INGUINAL HERNIA REPAIR WITH MESH;  Surgeon: [name], MD;  Location: [facility];  Service: General;  Laterality: Bilateral;  . LAPAROSCOPIC APPENDECTOMY  07/05/2011   Procedure: APPENDECTOMY LAPAROSCOPIC;  Surgeon: [name], MD;  Location: [place];  Service: General;  Laterality: N/A;  . MENISCUS REPAIR Right 2018   [name] @ [place]  . TRANSURETHRAL RESECTION OF PROSTATE N/A 07/28/2013   Procedure: TRANSURETHRAL RESECTION OF THE PROSTATE WITH GYRUS INSTRUMENTS;  Surgeon: [name], MD  . VASECTOMY    . WISDOM TOOTH EXTRACTION      Prior to Admission  medications   Medication Sig Start Date End Date Taking? Authorizing Provider  amLODipine (NORVASC) 5 MG tablet Take 1 tablet (5 mg total) by mouth daily. 01/21/20  Yes [name], MD  b complex vitamins tablet Take 1 tablet by mouth daily.   Yes [provider]  insulin aspart (NOVOLOG FLEXPEN) 100 UNIT/ML FlexPen Inject 10-18 Units into the skin 3 (three) times daily with meals. Patient taking differently: Inject 10-18 Units into the skin 3 (three) times daily with meals. 12 IU @ 0800 02/05/20  Yes [name], MD  metFORMIN (GLUCOPHAGE-XR) 500 MG 24 hr tablet TAKE 2 TABLETS (1,000 MG TOTAL) BY MOUTH DAILY. TAKE 2 TABLETS BY MOUTH TWICE DAILY WITH MEALS Patient taking differently: 500 mg daily with breakfast. 11/09/20  Yes [name], MD  olmesartan-hydrochlorothiazide (BENICAR HCT) 40-25 MG tablet Take 1 tablet by mouth daily. 01/21/20  Yes [name], MD  pantoprazole (PROTONIX) 40 MG tablet Take 1 tablet (40 mg total) by mouth daily. 01/21/20  Yes [name], MD  Semaglutide, 1 MG/DOSE, (OZEMPIC, 1 MG/DOSE,) 4 MG/3ML SOPN Inject 1 mg into the skin once a week. 11/22/20  Yes [name], MD  simvastatin (ZOCOR) 20 MG tablet TAKE 1 TABLET BY MOUTH EVERYDAY AT BEDTIME Patient taking differently: Take 20 mg by mouth at bedtime. TAKE 1 TABLET BY MOUTH EVERYDAY AT BEDTIME 01/21/20  Yes [name], MD  tadalafil (CIALIS) 20 MG tablet Take 0.5-1 tablets (10-20 mg total) by mouth every other day as needed for erectile dysfunction. 08/27/20  Yes [name], MD  TRESIBA FLEXTOUCH 200 UNIT/ML FlexTouch Pen INJECT 32 UNITS INTO THE SKIN DAILY. 12/08/19  Yes [name], MD  Continuous Blood Gluc Transmit (DEXCOM G6 TRANSMITTER) MISC 1 Device by Does not apply route every 3 (three) months. 08/30/20   [name], MD  Dextrose, Diabetic Use, (GLUCOSE PO) Take 1 tablet by mouth as needed.    [provider]  glucagon (GLUCAGON EMERGENCY)  1 MG injection Inject 1 mg into the muscle once as needed for up to 1 dose. 01/10/19   [name], MD  glucose blood (ONETOUCH ULTRA) test strip USE AS INSTRUCTED 4X A DAY. DAY SUPPLY PER INSURANCE. 10/08/20   [name], MD  Insulin Pen Needle (ADVOCATE INSULIN PEN NEEDLES) 31G X 5 MM MISC Use 3x a day 03/19/17   [name], MD    Allergies as of 10/14/2020  . (No Known Allergies)    Family History  Problem Relation Age of Onset  . Lymphoma Mother        Non-hodgkins  . Coronary artery disease Mother   . Heart disease Mother   . Cancer Father   . Alcohol abuse Father   . Diabetes Brother   . Pancreatic cancer Maternal Uncle   . Diabetes Brother   . Diabetes Brother   . Diabetes Other   . Colon cancer Neg Hx   . Lung cancer Neg Hx   . Esophageal cancer Neg Hx   . Rectal cancer Neg Hx   . Stomach cancer Neg Hx     Social History   Socioeconomic History  . Marital status: Married    Spouse name: Not on file  . Number of children: Not on file  . Years of education: Not on file  . Highest education level: Not on file  Occupational History  . Not on file  Tobacco Use  . Smoking status: Former Smoker    Packs/day: 0.30    Years: 50.00    Pack years: 15.00    Types: Cigarettes    Quit date: 08/29/2011    Years since quitting: 9.2  . Smokeless tobacco: Never Used  Vaping Use  . Vaping Use: Never used  Substance and Sexual Activity  . Alcohol use: Yes    Alcohol/week: 7.0 standard drinks    Types: 7 Standard drinks or equivalent per week    Comment: 1- 2 ounces a night  . Drug use: No  . Sexual activity: Not Currently  Other Topics Concern  . Not on file  Social History Narrative   MD ROSTER:   GI [name]   GS - Young      Daily Caffeine Use:  2   Owner/operater of dental lab-sold, but continues to work. Fully retired.   Married 1969   2 daughters 1972, 1976; 1 son 45   7 grandchildren   Edu: 10th Grade   Hobby: car restoration: has  '66 Vette, two classic Chevelle SS's and is restoring a '37 chevy coup   Regular Exercise -  NO   Diet: good water, fruits/vegetables daily   Social Determinants of Health   Financial Resource Strain: Low Risk   . Difficulty of Paying Living Expenses: Not very hard  Food Insecurity: No Food Insecurity  . Worried About Charity fundraiser in the Last Year: Never true  . Ran Out  of Food in the Last Year: Never true  Transportation Needs: No Transportation Needs  . Lack of Transportation (Medical): No  . Lack of Transportation (Non-Medical): No  Physical Activity: Inactive  . Days of Exercise per Week: 0 days  . Minutes of Exercise per Session: 0 min  Stress: No Stress Concern Present  . Feeling of Stress : Not at all  Social Connections: Not on file  Intimate Partner Violence: Not At Risk  . Fear of Current or Ex-Partner: No  . Emotionally Abused: No  . Physically Abused: No  . Sexually Abused: No    Review of Systems: See HPI, otherwise negative ROS  Physical Exam: BP (!) 150/75   Pulse 71   Temp (!) 96.8 F (36 C) (Temporal)   Resp 18   Ht 5' 11.5" (1.816 m)   Wt 93.4 kg   SpO2 95%   BMI 28.33 kg/m  General:   Alert,  pleasant and cooperative in NAD Head:  Normocephalic and atraumatic. Lungs:  Clear to auscultation.    Heart:  Regular rate and rhythm.   Impression/Plan: Anthony [name] is here for ophthalmic surgery.  Risks, benefits, limitations, and alternatives regarding ophthalmic surgery have been reviewed with the patient.  Questions have been answered.  All parties agreeable.   Leandrew Koyanagi, MD  12/01/2020, 12:00 PM

## 2020-12-01 NOTE — Anesthesia Preprocedure Evaluation (Addendum)
Anesthesia Evaluation  Patient identified by MRN, date of birth, ID band Patient awake    Reviewed: Allergy & Precautions, NPO status   Airway Mallampati: II  TM Distance: >3 FB     Dental   Pulmonary COPD, former smoker,    Pulmonary exam normal        Cardiovascular hypertension,  Rhythm:Regular Rate:Normal  HLD   Neuro/Psych    GI/Hepatic GERD  ,  Endo/Other  diabetes, Type 2, Insulin Dependent  Renal/GU      Musculoskeletal  (+) Arthritis ,   Abdominal   Peds  Hematology   Anesthesia Other Findings   Reproductive/Obstetrics                             Anesthesia Physical Anesthesia Plan  ASA: III  Anesthesia Plan: MAC   Post-op Pain Management:    Induction: Intravenous  PONV Risk Score and Plan: TIVA, Midazolam and Treatment may vary due to age or medical condition  Airway Management Planned: Natural Airway and Nasal Cannula  Additional Equipment:   Intra-op Plan:   Post-operative Plan:   Informed Consent: I have reviewed the patients History and Physical, chart, labs and discussed the procedure including the risks, benefits and alternatives for the proposed anesthesia with the patient or authorized representative who has indicated his/her understanding and acceptance.       Plan Discussed with: CRNA  Anesthesia Plan Comments: (Blood glucose 60 in preop (correlated with patient's monitor). Given 1/2 amp D50 and blood glucose 150 on patient's monitor (20 minutes after D50). )       Anesthesia Quick Evaluation

## 2020-12-01 NOTE — Anesthesia Postprocedure Evaluation (Signed)
Anesthesia Post Note  Patient: Anthony [name]  Procedure(s) Performed: CATARACT EXTRACTION PHACO AND INTRAOCULAR LENS PLACEMENT (IOC)  RIGHT VIVITY TORIC LENS DIABETIC (Right Eye)     Patient location during evaluation: PACU Anesthesia Type: MAC Level of consciousness: awake Pain management: pain level controlled Vital Signs Assessment: post-procedure vital signs reviewed and stable Respiratory status: respiratory function stable Cardiovascular status: stable Postop Assessment: no apparent nausea or vomiting Anesthetic complications: no   No complications documented.  Veda Canning

## 2020-12-01 NOTE — Anesthesia Procedure Notes (Signed)
Procedure Name: MAC Date/Time: 12/01/2020 12:53 PM Performed by: Silvana Newness, CRNA Pre-anesthesia Checklist: Patient identified, Emergency Drugs available, Suction available, Patient being monitored and Timeout performed Patient Re-evaluated:Patient Re-evaluated prior to induction Oxygen Delivery Method: Nasal cannula Placement Confirmation: positive ETCO2

## 2020-12-01 NOTE — Transfer of Care (Signed)
Immediate Anesthesia Transfer of Care Note  Patient: Anthony [name]  Procedure(s) Performed: CATARACT EXTRACTION PHACO AND INTRAOCULAR LENS PLACEMENT (IOC)  RIGHT VIVITY TORIC LENS DIABETIC (Right Eye)  Patient Location: PACU  Anesthesia Type: MAC  Level of Consciousness: awake, alert  and patient cooperative  Airway and Oxygen Therapy: Patient Spontanous Breathing and Patient connected to supplemental oxygen  Post-op Assessment: Post-op Vital signs reviewed, Patient's Cardiovascular Status Stable, Respiratory Function Stable, Patent Airway and No signs of Nausea or vomiting  Post-op Vital Signs: Reviewed and stable  Complications: No complications documented.

## 2020-12-02 ENCOUNTER — Ambulatory Visit: Payer: Medicare HMO | Admitting: Internal Medicine

## 2020-12-02 ENCOUNTER — Other Ambulatory Visit: Payer: Self-pay

## 2020-12-02 ENCOUNTER — Encounter: Payer: Self-pay | Admitting: Ophthalmology

## 2020-12-02 VITALS — BP 114/70 | HR 87 | Ht 71.0 in | Wt 202.0 lb

## 2020-12-02 DIAGNOSIS — I2510 Atherosclerotic heart disease of native coronary artery without angina pectoris: Secondary | ICD-10-CM

## 2020-12-02 DIAGNOSIS — E782 Mixed hyperlipidemia: Secondary | ICD-10-CM | POA: Insufficient documentation

## 2020-12-02 DIAGNOSIS — E113299 Type 2 diabetes mellitus with mild nonproliferative diabetic retinopathy without macular edema, unspecified eye: Secondary | ICD-10-CM

## 2020-12-02 DIAGNOSIS — J449 Chronic obstructive pulmonary disease, unspecified: Secondary | ICD-10-CM

## 2020-12-02 DIAGNOSIS — E785 Hyperlipidemia, unspecified: Secondary | ICD-10-CM

## 2020-12-02 DIAGNOSIS — E1169 Type 2 diabetes mellitus with other specified complication: Secondary | ICD-10-CM | POA: Diagnosis not present

## 2020-12-02 DIAGNOSIS — Z794 Long term (current) use of insulin: Secondary | ICD-10-CM

## 2020-12-02 DIAGNOSIS — I700 Atherosclerosis of aorta: Secondary | ICD-10-CM

## 2020-12-02 NOTE — Progress Notes (Signed)
Cardiology Office Note:    Date:  12/02/2020   ID:  Anthony [name], DOB May 04, 1946, MRN [id number]  PCP:  [name], MD  [facility] Cardiologist: ,Rudean Haskell MD [place] Electrophysiologist:  None   CC: Shortness of breath Follow up  History of Present Illness:    Anthony [name] is a 75 y.o. male with a hx of CAC, Aortic Atherosclerosis, DM with HTN, HLD, Erectile Dysfunction, COPD former smoker who presents for evaluation 08/31/20.  In interim of this visit, patient had uncomplicated cataract surgery.  We had talked about possible stress testing at last visit. Seen 12/02/20.  Patient notes that he is having some blood sugar issues with low BG.  Since day last visit notes no changes. Felt better with BG being back up Relevant interval testing or therapy include Caratract surgery without issue.  There are no interval hospital/ED visit.    No chest pain or pressure .  Notes that he is working pretty well with minimal DOE.  No weight gain or leg swelling.  No palpitations or syncope.  Ambulatory blood pressure < 120/80.   Past Medical History:  Diagnosis Date  . Anxiety    denies  . Arthritis    a little  . Barrett's esophagus 12/08/2015   By EGD 2010 [name])   . BPH with obstruction/lower urinary tract symptoms 2014   s/p TURP  . Depression    denies  . DKA (diabetic ketoacidoses) 01/2010   "in coma"  . GERD (gastroesophageal reflux disease)   . H/O bronchitis    after every cold  . HTN (hypertension)   . Hyperlipidemia    controlled with medicine  . Otitis media   . Rheumatic fever    maybe as child  . Type 2 diabetes, uncontrolled, with mild 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 macular edema ([name])    completed DMSE    Past Surgical History:  Procedure Laterality Date  . ABI  12/2013   WNL  . APPENDECTOMY    . CATARACT EXTRACTION W/PHACO Right 12/01/2020   Procedure: CATARACT EXTRACTION PHACO AND INTRAOCULAR LENS PLACEMENT ([place])   RIGHT VIVITY TORIC LENS DIABETIC;  Surgeon: Leandrew Koyanagi, MD;  Location: [place];  Service: Ophthalmology;  Laterality: Right;  6.93 1:12.8 9.5%  . CHOLECYSTECTOMY     years ago  . COLONOSCOPY  01/2009   TA, rec rpt 5 yrs [name])  . COLONOSCOPY  01/2018   TAx2, diverticulosis, int hemorrhoids, rpt 5 yrs ([name])  . ESOPHAGOGASTRODUODENOSCOPY  01/2009   biopsy with Barrett's  . ESOPHAGOGASTRODUODENOSCOPY  01/2018   WNL - no barrett's ([name])  . INGUINAL HERNIA REPAIR Bilateral 03/04/2020   Procedure: LAPAROSCOPIC BILATERAL INGUINAL HERNIA REPAIR WITH MESH;  Surgeon: [name], MD;  Location: [place];  Service: General;  Laterality: Bilateral;  . LAPAROSCOPIC APPENDECTOMY  07/05/2011   Procedure: APPENDECTOMY LAPAROSCOPIC;  Surgeon: [name], MD;  Location: [place];  Service: General;  Laterality: N/A;  . MENISCUS REPAIR Right 2018   [name] @ [place]  . TRANSURETHRAL RESECTION OF PROSTATE N/A 07/28/2013   Procedure: TRANSURETHRAL RESECTION OF THE PROSTATE WITH GYRUS INSTRUMENTS;  Surgeon: [name], MD  . VASECTOMY    . WISDOM TOOTH EXTRACTION      Current Medications: Current Meds  Medication Sig  . amLODipine (NORVASC) 5 MG tablet Take 1 tablet (5 mg total) by mouth daily.  [name] Kitchen b complex vitamins tablet Take 1 tablet by mouth daily.  . Continuous Blood Gluc Transmit (  DEXCOM G6 TRANSMITTER) MISC 1 Device by Does not apply route every 3 (three) months.  . Dextrose, Diabetic Use, (GLUCOSE PO) Take 1 tablet by mouth as needed.  [name] Kitchen glucagon (GLUCAGON EMERGENCY) 1 MG injection Inject 1 mg into the muscle once as needed for up to 1 dose.  [name] Kitchen glucose blood (ONETOUCH ULTRA) test strip USE AS INSTRUCTED 4X A DAY. DAY SUPPLY PER INSURANCE.  [name] Kitchen insulin aspart (NOVOLOG FLEXPEN) 100 UNIT/ML FlexPen Inject 10-18 Units into the skin 3 (three) times daily with meals.  . Insulin Pen Needle (ADVOCATE INSULIN PEN NEEDLES) 31G X 5 MM MISC Use 3x a day  .  metFORMIN (GLUCOPHAGE-XR) 500 MG 24 hr tablet TAKE 2 TABLETS (1,000 MG TOTAL) BY MOUTH DAILY. TAKE 2 TABLETS BY MOUTH TWICE DAILY WITH MEALS (Patient taking differently: 500 mg daily with breakfast.)  . olmesartan-hydrochlorothiazide (BENICAR HCT) 40-25 MG tablet Take 1 tablet by mouth daily.  . pantoprazole (PROTONIX) 40 MG tablet Take 1 tablet (40 mg total) by mouth daily.  . Semaglutide, 1 MG/DOSE, (OZEMPIC, 1 MG/DOSE,) 4 MG/3ML SOPN Inject 1 mg into the skin once a week.  . simvastatin (ZOCOR) 20 MG tablet TAKE 1 TABLET BY MOUTH EVERYDAY AT BEDTIME  . tadalafil (CIALIS) 20 MG tablet Take 0.5-1 tablets (10-20 mg total) by mouth every other day as needed for erectile dysfunction.  . TRESIBA FLEXTOUCH 200 UNIT/ML FlexTouch Pen INJECT 32 UNITS INTO THE SKIN DAILY.     Allergies:   Patient has no known allergies.   Social History   Socioeconomic History  . Marital status: Married    Spouse name: Not on file  . Number of children: Not on file  . Years of education: Not on file  . Highest education level: Not on file  Occupational History  . Not on file  Tobacco Use  . Smoking status: Former Smoker    Packs/day: 0.30    Years: 50.00    Pack years: 15.00    Types: Cigarettes    Quit date: 08/29/2011    Years since quitting: 9.2  . Smokeless tobacco: Never Used  Vaping Use  . Vaping Use: Never used  Substance and Sexual Activity  . Alcohol use: Yes    Alcohol/week: 7.0 standard drinks    Types: 7 Standard drinks or equivalent per week    Comment: 1- 2 ounces a night  . Drug use: No  . Sexual activity: Not Currently  Other Topics Concern  . Not on file  Social History Narrative   MD ROSTER:   GI [name]   GS - Young      Daily Caffeine Use:  2   Owner/operater of dental lab-sold, but continues to work. Fully retired.   Married 1969   2 daughters 1972, 1976; 1 son 45   7 grandchildren   Edu: 10th Grade   Hobby: car restoration: has '66 Vette, two classic Chevelle SS's  and is restoring a '37 chevy coup   Regular Exercise -  NO   Diet: good water, fruits/vegetables daily   Social Determinants of Health   Financial Resource Strain: Low Risk   . Difficulty of Paying Living Expenses: Not very hard  Food Insecurity: No Food Insecurity  . Worried About Charity fundraiser in the Last Year: Never true  . Ran Out of Food in the Last Year: Never true  Transportation Needs: No Transportation Needs  . Lack of Transportation (Medical): No  . Lack of Transportation (Non-Medical): No  Physical Activity: Inactive  . Days of Exercise per Week: 0 days  . Minutes of Exercise per Session: 0 min  Stress: No Stress Concern Present  . Feeling of Stress : Not at all  Social Connections: Not on file    [place]; worked at Pharmacist, community  Family History: The patient's family history includes Alcohol abuse in his father; Cancer in his father; Coronary artery disease in his mother; Diabetes in his brother, brother, brother and another family member; Heart disease in his mother; Lymphoma in his mother; Pancreatic cancer in his maternal uncle. There is no history of Colon cancer, Lung cancer, Esophageal cancer, Rectal cancer, or Stomach cancer. History of coronary artery disease notable for mother. History of heart failure notable for no members. History of arrhythmia notable for no members. No history of bicuspid aortic valve or aortic aneurysm or dissection.  ROS:   Please see the history of present illness.     All other systems reviewed and are negative.  EKGs/Labs/Other Studies Reviewed:    The following studies were reviewed today:  EKG:   07/28/20: Sinus 75 LAD  NonCardiac CT: Date: 02/25/2020 Results:Distal LAD and D1 calcium, aortic arch atherosclerosis  Recent Labs: 01/12/2020: ALT 13 03/02/2020: BUN 12; Creatinine, Ser 1.02; Hemoglobin 14.5; Platelets 227; Potassium 3.6; Sodium 139  Recent Lipid Panel    Component Value Date/Time   CHOL 132  08/31/2020 1437   TRIG 112 08/31/2020 1437   HDL 43 08/31/2020 1437   CHOLHDL 3.1 08/31/2020 1437   CHOLHDL 3 01/12/2020 1021   VLDL 23.2 01/12/2020 1021   LDLCALC 69 08/31/2020 1437   LDLDIRECT 94.0 10/05/2016 1728   Risk Assessment/Calculations:     N/A  Physical Exam:    VS:  BP 114/70   Pulse 87   Ht 5\' 11"  (1.803 m)   Wt 202 lb (91.6 kg)   SpO2 96%   BMI 28.17 kg/m     Wt Readings from Last 3 Encounters:  12/02/20 202 lb (91.6 kg)  12/01/20 206 lb (93.4 kg)  10/27/20 210 lb 7 oz (95.5 kg)     GEN: Well nourished, well developed in no acute distress HEENT: Normal NECK: No JVD; No carotid bruits LYMPHATICS: No lymphadenopathy CARDIAC: RRR, no murmurs, rubs, gallops RESPIRATORY:  Good air movement with bilateral expiratory wheezes ABDOMEN: Soft, non-tender, non-distended MUSCULOSKELETAL:  No edema; No deformity  SKIN: Warm and dry NEUROLOGIC:  Alert and oriented x 3 PSYCHIATRIC:  Normal affect   ASSESSMENT:    1. Aortic atherosclerosis ([place])   2. Chronic obstructive pulmonary disease, unspecified COPD type ([place])   3. Type 2 diabetes 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, with long-term current use of insulin, unspecified laterality ([place])   4. Hyperlipidemia associated with type 2 diabetes mellitus ([place])   5. 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 will do CCTA  Diabetes with hypertension - ambulatory blood pressure at goal, will continue ambulatory BP monitoring; gave education on how to perform  ambulatory blood pressure monitoring including the frequency and technique; goal ambulatory blood pressure <  135/85 on average - continue home medications  - discussed diet (DASH/low sodium), and exercise/weight loss interventions  Summer-Fall follow up unless new symptoms or abnormal test results warranting change in plan  Would be reasonable for  Video Visit Follow up  Would be reasonable for  APP Follow up    Medication Adjustments/Labs and Tests Ordered: Current medicines are reviewed at length with the patient today.  Concerns regarding medicines are outlined above.  No orders of the defined types were placed in this encounter.  No orders of the defined types were placed in this encounter.   Patient Instructions  Medication Instructions:  Your physician recommends that you continue on your current medications as directed. Please refer to the Current Medication list given to you today.  *If you need a refill on your cardiac medications before your next appointment, please call your pharmacy*   Lab Work: NONE If you have labs (blood work) drawn today and your tests are completely normal, you will receive your results only by: [name] Kitchen MyChart Message (if you have MyChart) OR . A paper copy in the mail If you have any lab test that is abnormal or we need to change your treatment, we will call you to review the results.   Testing/Procedures: NONE   Follow-Up: At [facility], you and your health needs are our priority.  As part of our continuing mission to provide you with exceptional heart care, we have created designated Provider Care Teams.  These Care Teams include your primary Cardiologist (physician) and Advanced Practice Providers (APPs -  Physician Assistants and Nurse Practitioners) who all work together to provide you with the care you need, when you need it.  We recommend signing up for the patient portal called "MyChart".  Sign up information is provided on this After  Visit Summary.  MyChart is used to connect with patients for Virtual Visits (Telemedicine).  Patients are able to view lab/test results, encounter notes, upcoming appointments, etc.  Non-urgent messages can be sent to your provider as well.   To learn more about what you can do with MyChart, go to [email or web address].    Your next appointment:   5-6  month(s)  The format for your next appointment:   In Person  Provider:   You may see Rudean Haskell, MD or one of the following Advanced Practice Providers on your designated Care Team:    Melina Copa, PA-C  Ermalinda Barrios, PA-C    Other Instructions Please call the office and let us know if your shortness of breath worsens.     Signed, Werner Lean, MD  12/02/2020 12:58 PM    [facility]

## 2020-12-02 NOTE — Patient Instructions (Signed)
Medication Instructions:  Your physician recommends that you continue on your current medications as directed. Please refer to the Current Medication list given to you today.  *If you need a refill on your cardiac medications before your next appointment, please call your pharmacy*   Lab Work: NONE If you have labs (blood work) drawn today and your tests are completely normal, you will receive your results only by: [name] Kitchen MyChart Message (if you have MyChart) OR . A paper copy in the mail If you have any lab test that is abnormal or we need to change your treatment, we will call you to review the results.   Testing/Procedures: NONE   Follow-Up: At [facility], you and your health needs are our priority.  As part of our continuing mission to provide you with exceptional heart care, we have created designated Provider Care Teams.  These Care Teams include your primary Cardiologist (physician) and Advanced Practice Providers (APPs -  Physician Assistants and Nurse Practitioners) who all work together to provide you with the care you need, when you need it.  We recommend signing up for the patient portal called "MyChart".  Sign up information is provided on this After Visit Summary.  MyChart is used to connect with patients for Virtual Visits (Telemedicine).  Patients are able to view lab/test results, encounter notes, upcoming appointments, etc.  Non-urgent messages can be sent to your provider as well.   To learn more about what you can do with MyChart, go to [email or web address].    Your next appointment:   5-6  month(s)  The format for your next appointment:   In Person  Provider:   You may see Rudean Haskell, MD or one of the following Advanced Practice Providers on your designated Care Team:    Melina Copa, PA-C  Ermalinda Barrios, PA-C    Other Instructions Please call the office and let us know if your shortness of breath worsens.

## 2020-12-06 ENCOUNTER — Telehealth: Payer: Self-pay

## 2020-12-06 DIAGNOSIS — H2512 Age-related nuclear cataract, left eye: Secondary | ICD-10-CM | POA: Diagnosis not present

## 2020-12-06 NOTE — Chronic Care Management (AMB) (Addendum)
    Chronic Care Management Pharmacy Assistant   Name: JORDANNY WADDINGTON  MRN: [id number] DOB: Aug 08, 1946  Reason for Encounter: Medication Review- Ozempic  Medications: Outpatient Encounter Medications as of 12/06/2020  Medication Sig   amLODipine (NORVASC) 5 MG tablet Take 1 tablet (5 mg total) by mouth daily.   b complex vitamins tablet Take 1 tablet by mouth daily.   Continuous Blood Gluc Transmit (DEXCOM G6 TRANSMITTER) MISC 1 Device by Does not apply route every 3 (three) months.   Dextrose, Diabetic Use, (GLUCOSE PO) Take 1 tablet by mouth as needed.   glucagon (GLUCAGON EMERGENCY) 1 MG injection Inject 1 mg into the muscle once as needed for up to 1 dose.   glucose blood (ONETOUCH ULTRA) test strip USE AS INSTRUCTED 4X A DAY. DAY SUPPLY PER INSURANCE.   insulin aspart (NOVOLOG FLEXPEN) 100 UNIT/ML FlexPen Inject 10-18 Units into the skin 3 (three) times daily with meals.   Insulin Pen Needle (ADVOCATE INSULIN PEN NEEDLES) 31G X 5 MM MISC Use 3x a day   metFORMIN (GLUCOPHAGE-XR) 500 MG 24 hr tablet TAKE 2 TABLETS (1,000 MG TOTAL) BY MOUTH DAILY. TAKE 2 TABLETS BY MOUTH TWICE DAILY WITH MEALS (Patient taking differently: 500 mg daily with breakfast.)   olmesartan-hydrochlorothiazide (BENICAR HCT) 40-25 MG tablet Take 1 tablet by mouth daily.   pantoprazole (PROTONIX) 40 MG tablet Take 1 tablet (40 mg total) by mouth daily.   Semaglutide, 1 MG/DOSE, (OZEMPIC, 1 MG/DOSE,) 4 MG/3ML SOPN Inject 1 mg into the skin once a week.   simvastatin (ZOCOR) 20 MG tablet TAKE 1 TABLET BY MOUTH EVERYDAY AT BEDTIME   tadalafil (CIALIS) 20 MG tablet Take 0.5-1 tablets (10-20 mg total) by mouth every other day as needed for erectile dysfunction.   TRESIBA FLEXTOUCH 200 UNIT/ML FlexTouch Pen INJECT 32 UNITS INTO THE SKIN DAILY.   No facility-administered encounter medications on file as of 12/06/2020.   Patient had some questions regarding Ozempic. He wanted to make sure that he should twist all the way  over to 1 mg on the pen. Per Sharyn Lull this is correct. He should be using 1 mg once a week.   Follow-Up:  Pharmacist Review  Debbora Dus, CPP notified  [name], [place] Assistant [phone]  I have reviewed the care management and care coordination activities outlined in this encounter and I am certifying that I agree with the content of this note. No further action required.  Debbora Dus, PharmD Clinical Pharmacist [facility] at [facility] [phone]

## 2020-12-07 ENCOUNTER — Encounter: Payer: Self-pay | Admitting: Ophthalmology

## 2020-12-13 NOTE — Discharge Instructions (Signed)

## 2020-12-15 ENCOUNTER — Ambulatory Visit: Payer: Medicare HMO | Admitting: Anesthesiology

## 2020-12-15 ENCOUNTER — Other Ambulatory Visit: Payer: Self-pay

## 2020-12-15 ENCOUNTER — Ambulatory Visit
Admission: RE | Admit: 2020-12-15 | Discharge: 2020-12-15 | Disposition: A | Discharge status: Home / Self Care | Payer: Medicare HMO | Source: Ambulatory Visit | Attending: Ophthalmology | Admitting: Ophthalmology

## 2020-12-15 ENCOUNTER — Encounter: Payer: Self-pay | Admitting: Ophthalmology

## 2020-12-15 ENCOUNTER — Encounter
Admission: RE | Disposition: A | Discharge status: Home / Self Care | Payer: Self-pay | Source: Ambulatory Visit | Attending: Ophthalmology

## 2020-12-15 DIAGNOSIS — E113299 Type 2 diabetes mellitus with mild nonproliferative diabetic retinopathy without macular edema, unspecified eye: Secondary | ICD-10-CM | POA: Insufficient documentation

## 2020-12-15 DIAGNOSIS — Z794 Long term (current) use of insulin: Secondary | ICD-10-CM | POA: Diagnosis not present

## 2020-12-15 DIAGNOSIS — Z8719 Personal history of other diseases of the digestive system: Secondary | ICD-10-CM | POA: Diagnosis not present

## 2020-12-15 DIAGNOSIS — Z9049 Acquired absence of other specified parts of digestive tract: Secondary | ICD-10-CM | POA: Diagnosis not present

## 2020-12-15 DIAGNOSIS — Z7984 Long term (current) use of oral hypoglycemic drugs: Secondary | ICD-10-CM | POA: Diagnosis not present

## 2020-12-15 DIAGNOSIS — Z87891 Personal history of nicotine dependence: Secondary | ICD-10-CM | POA: Diagnosis not present

## 2020-12-15 DIAGNOSIS — E785 Hyperlipidemia, unspecified: Secondary | ICD-10-CM | POA: Insufficient documentation

## 2020-12-15 DIAGNOSIS — E1136 Type 2 diabetes mellitus with diabetic cataract: Secondary | ICD-10-CM | POA: Diagnosis not present

## 2020-12-15 DIAGNOSIS — Z833 Family history of diabetes mellitus: Secondary | ICD-10-CM | POA: Diagnosis not present

## 2020-12-15 DIAGNOSIS — Z9852 Vasectomy status: Secondary | ICD-10-CM | POA: Insufficient documentation

## 2020-12-15 DIAGNOSIS — I10 Essential (primary) hypertension: Secondary | ICD-10-CM | POA: Diagnosis not present

## 2020-12-15 DIAGNOSIS — Z8249 Family history of ischemic heart disease and other diseases of the circulatory system: Secondary | ICD-10-CM | POA: Diagnosis not present

## 2020-12-15 DIAGNOSIS — Z79899 Other long term (current) drug therapy: Secondary | ICD-10-CM | POA: Insufficient documentation

## 2020-12-15 DIAGNOSIS — H2512 Age-related nuclear cataract, left eye: Secondary | ICD-10-CM | POA: Insufficient documentation

## 2020-12-15 DIAGNOSIS — H25812 Combined forms of age-related cataract, left eye: Secondary | ICD-10-CM | POA: Diagnosis not present

## 2020-12-15 HISTORY — PX: CATARACT EXTRACTION W/PHACO: SHX586

## 2020-12-15 LAB — GLUCOSE, CAPILLARY
Glucose-Capillary: 195 mg/dL — ABNORMAL HIGH (ref 70–99)
Glucose-Capillary: 91 mg/dL (ref 70–99)

## 2020-12-15 SURGERY — PHACOEMULSIFICATION, CATARACT, WITH IOL INSERTION
Anesthesia: Monitor Anesthesia Care | Site: Eye | Laterality: Left

## 2020-12-15 MED ORDER — NA HYALUR & NA CHOND-NA HYALUR 0.4-0.35 ML IO KIT
PACK | INTRAOCULAR | Status: DC | PRN
  Administered 2020-12-15: 1 mL via INTRAOCULAR

## 2020-12-15 MED ORDER — LACTATED RINGERS IV SOLN: INTRAVENOUS | Status: DC

## 2020-12-15 MED ORDER — CEFUROXIME OPHTHALMIC INJECTION 1 MG/0.1 ML
INJECTION | OPHTHALMIC | Status: DC | PRN
  Administered 2020-12-15: 0.1 mL via INTRACAMERAL

## 2020-12-15 MED ORDER — FENTANYL CITRATE (PF) 100 MCG/2ML IJ SOLN
INTRAMUSCULAR | Status: DC | PRN
  Administered 2020-12-15: 100 ug via INTRAVENOUS

## 2020-12-15 MED ORDER — ARMC OPHTHALMIC DILATING DROPS
1.0000 "application " | OPHTHALMIC | Status: DC | PRN
  Administered 2020-12-15 (×3): 1 via OPHTHALMIC

## 2020-12-15 MED ORDER — TETRACAINE HCL 0.5 % OP SOLN
1.0000 [drp] | OPHTHALMIC | Status: DC | PRN
  Administered 2020-12-15 (×3): 1 [drp] via OPHTHALMIC

## 2020-12-15 MED ORDER — LIDOCAINE HCL (PF) 2 % IJ SOLN
INTRAOCULAR | Status: DC | PRN
  Administered 2020-12-15: 1 mL

## 2020-12-15 MED ORDER — MIDAZOLAM HCL 2 MG/2ML IJ SOLN
INTRAMUSCULAR | Status: DC | PRN
  Administered 2020-12-15: 2 mg via INTRAVENOUS

## 2020-12-15 MED ORDER — EPINEPHRINE PF 1 MG/ML IJ SOLN
INTRAOCULAR | Status: DC | PRN
  Administered 2020-12-15: 69 mL via OPHTHALMIC

## 2020-12-15 MED ORDER — ACETAMINOPHEN 325 MG PO TABS: 325.0000 mg | ORAL_TABLET | Freq: Once | ORAL | Status: DC

## 2020-12-15 MED ORDER — ACETAMINOPHEN 160 MG/5ML PO SOLN: 325.0000 mg | Freq: Once | ORAL | Status: DC

## 2020-12-15 MED ORDER — BRIMONIDINE TARTRATE-TIMOLOL 0.2-0.5 % OP SOLN
OPHTHALMIC | Status: DC | PRN
  Administered 2020-12-15: 1 [drp] via OPHTHALMIC

## 2020-12-15 SURGICAL SUPPLY — 29 items
CANNULA ANT/CHMB 27G (MISCELLANEOUS) ×1 IMPLANT
CANNULA ANT/CHMB 27GA (MISCELLANEOUS) ×2 IMPLANT
GLOVE SURG ENC TEXT LTX SZ7.5 (GLOVE) ×3 IMPLANT
GLOVE SURG TRIUMPH 8.0 PF LTX (GLOVE) ×2 IMPLANT
GOWN STRL REUS W/ TWL LRG LVL3 (GOWN DISPOSABLE) ×2 IMPLANT
GOWN STRL REUS W/TWL LRG LVL3 (GOWN DISPOSABLE) ×4
LENS IOL ACRSF VT TRC 315 22.5 IMPLANT
LENS IOL ACRYSOF VIVITY 22.5 ×2 IMPLANT
LENS IOL VIVITY 315 22.5 ×1 IMPLANT
MARKER SKIN DUAL TIP RULER LAB (MISCELLANEOUS) ×2 IMPLANT
NDL CAPSULORHEX 25GA (NEEDLE) ×1 IMPLANT
NDL FILTER BLUNT 18X1 1/2 (NEEDLE) ×2 IMPLANT
NDL RETROBULBAR .5 NSTRL (NEEDLE) IMPLANT
NEEDLE CAPSULORHEX 25GA (NEEDLE) ×2 IMPLANT
NEEDLE FILTER BLUNT 18X 1/2SAF (NEEDLE) ×2
NEEDLE FILTER BLUNT 18X1 1/2 (NEEDLE) ×2 IMPLANT
PACK CATARACT BRASINGTON (MISCELLANEOUS) ×2 IMPLANT
PACK EYE AFTER SURG (MISCELLANEOUS) ×2 IMPLANT
PACK OPTHALMIC (MISCELLANEOUS) ×2 IMPLANT
RING MALYGIN 7.0 (MISCELLANEOUS) IMPLANT
SOLUTION OPHTHALMIC SALT (MISCELLANEOUS) ×2 IMPLANT
SUT ETHILON 10-0 CS-B-6CS-B-6 (SUTURE)
SUT VICRYL  9 0 (SUTURE)
SUT VICRYL 9 0 (SUTURE) IMPLANT
SUTURE EHLN 10-0 CS-B-6CS-B-6 (SUTURE) IMPLANT
SYR 3ML LL SCALE MARK (SYRINGE) ×4 IMPLANT
SYR TB 1ML LUER SLIP (SYRINGE) ×2 IMPLANT
WATER STERILE IRR 250ML POUR (IV SOLUTION) ×2 IMPLANT
WIPE NON LINTING 3.25X3.25 (MISCELLANEOUS) ×2 IMPLANT

## 2020-12-15 NOTE — H&P (Signed)
[facility]   Primary Care Physician:  [name], MD Ophthalmologist: Dr. Leandrew Koyanagi  Pre-Procedure History & Physical: HPI:  Anthony [name] is a 74 y.o. male here for ophthalmic surgery.   Past Medical History:  Diagnosis Date  . Anxiety    denies  . Arthritis    a little  . Barrett's esophagus 12/08/2015   By EGD 2010 [name])   . BPH with obstruction/lower urinary tract symptoms 2014   s/p TURP  . Depression    denies  . DKA (diabetic ketoacidoses) 01/2010   "in coma"  . GERD (gastroesophageal reflux disease)   . H/O bronchitis    after every cold  . HTN (hypertension)   . Hyperlipidemia    controlled with medicine  . Otitis media   . Rheumatic fever    maybe as child  . Type 2 diabetes, uncontrolled, with mild 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 macular edema ([name])    completed DMSE    Past Surgical History:  Procedure Laterality Date  . ABI  12/2013   WNL  . APPENDECTOMY    . CATARACT EXTRACTION W/PHACO Right 12/01/2020   Procedure: CATARACT EXTRACTION PHACO AND INTRAOCULAR LENS PLACEMENT ([place])  RIGHT VIVITY TORIC LENS DIABETIC;  Surgeon: Leandrew Koyanagi, MD;  Location: [place];  Service: Ophthalmology;  Laterality: Right;  6.93 1:12.8 9.5%  . CHOLECYSTECTOMY     years ago  . COLONOSCOPY  01/2009   TA, rec rpt 5 yrs [name])  . COLONOSCOPY  01/2018   TAx2, diverticulosis, int hemorrhoids, rpt 5 yrs ([name])  . ESOPHAGOGASTRODUODENOSCOPY  01/2009   biopsy with Barrett's  . ESOPHAGOGASTRODUODENOSCOPY  01/2018   WNL - no barrett's ([name])  . INGUINAL HERNIA REPAIR Bilateral 03/04/2020   Procedure: LAPAROSCOPIC BILATERAL INGUINAL HERNIA REPAIR WITH MESH;  Surgeon: [name], MD;  Location: [place];  Service: General;  Laterality: Bilateral;  . LAPAROSCOPIC APPENDECTOMY  07/05/2011   Procedure: APPENDECTOMY LAPAROSCOPIC;  Surgeon: [name], MD;  Location: [place];  Service: General;  Laterality: N/A;  .  MENISCUS REPAIR Right 2018   [name] @ [facility]  . TRANSURETHRAL RESECTION OF PROSTATE N/A 07/28/2013   Procedure: TRANSURETHRAL RESECTION OF THE PROSTATE WITH GYRUS INSTRUMENTS;  Surgeon: [name], MD  . VASECTOMY    . WISDOM TOOTH EXTRACTION      Prior to Admission medications   Medication Sig Start Date End Date Taking? Authorizing Provider  amLODipine (NORVASC) 5 MG tablet Take 1 tablet (5 mg total) by mouth daily. 01/21/20  Yes [name], MD  b complex vitamins tablet Take 1 tablet by mouth daily.   Yes [provider]  insulin aspart (NOVOLOG FLEXPEN) 100 UNIT/ML FlexPen Inject 10-18 Units into the skin 3 (three) times daily with meals. 02/05/20  Yes [name], MD  metFORMIN (GLUCOPHAGE-XR) 500 MG 24 hr tablet TAKE 2 TABLETS (1,000 MG TOTAL) BY MOUTH DAILY. TAKE 2 TABLETS BY MOUTH TWICE DAILY WITH MEALS Patient taking differently: 500 mg daily with breakfast. 11/09/20  Yes [name], MD  olmesartan-hydrochlorothiazide (BENICAR HCT) 40-25 MG tablet Take 1 tablet by mouth daily. 01/21/20  Yes [name], MD  pantoprazole (PROTONIX) 40 MG tablet Take 1 tablet (40 mg total) by mouth daily. 01/21/20  Yes [name], MD  Semaglutide, 1 MG/DOSE, (OZEMPIC, 1 MG/DOSE,) 4 MG/3ML SOPN Inject 1 mg into the skin once a week. 11/22/20  Yes [name], MD  simvastatin (ZOCOR) 20 MG tablet TAKE 1 TABLET BY MOUTH EVERYDAY AT  BEDTIME 01/21/20  Yes [name], MD  tadalafil (CIALIS) 20 MG tablet Take 0.5-1 tablets (10-20 mg total) by mouth every other day as needed for erectile dysfunction. 08/27/20  Yes [name], MD  TRESIBA FLEXTOUCH 200 UNIT/ML FlexTouch Pen INJECT 32 UNITS INTO THE SKIN DAILY. 12/08/19  Yes [name], MD  Continuous Blood Gluc Transmit (DEXCOM G6 TRANSMITTER) MISC 1 Device by Does not apply route every 3 (three) months. 08/30/20   [name], MD  Dextrose, Diabetic Use, (GLUCOSE PO) Take 1  tablet by mouth as needed.    [provider]  glucagon (GLUCAGON EMERGENCY) 1 MG injection Inject 1 mg into the muscle once as needed for up to 1 dose. 01/10/19   [name], MD  glucose blood (ONETOUCH ULTRA) test strip USE AS INSTRUCTED 4X A DAY. DAY SUPPLY PER INSURANCE. 10/08/20   [name], MD  Insulin Pen Needle (ADVOCATE INSULIN PEN NEEDLES) 31G X 5 MM MISC Use 3x a day 03/19/17   [name], MD    Allergies as of 10/14/2020  . (No Known Allergies)    Family History  Problem Relation Age of Onset  . Lymphoma Mother        Non-hodgkins  . Coronary artery disease Mother   . Heart disease Mother   . Cancer Father   . Alcohol abuse Father   . Diabetes Brother   . Pancreatic cancer Maternal Uncle   . Diabetes Brother   . Diabetes Brother   . Diabetes Other   . Colon cancer Neg Hx   . Lung cancer Neg Hx   . Esophageal cancer Neg Hx   . Rectal cancer Neg Hx   . Stomach cancer Neg Hx     Social History   Socioeconomic History  . Marital status: Married    Spouse name: Not on file  . Number of children: Not on file  . Years of education: Not on file  . Highest education level: Not on file  Occupational History  . Not on file  Tobacco Use  . Smoking status: Former Smoker    Packs/day: 0.30    Years: 50.00    Pack years: 15.00    Types: Cigarettes    Quit date: 08/29/2011    Years since quitting: 9.3  . Smokeless tobacco: Never Used  Vaping Use  . Vaping Use: Never used  Substance and Sexual Activity  . Alcohol use: Yes    Alcohol/week: 7.0 standard drinks    Types: 7 Standard drinks or equivalent per week    Comment: 1- 2 ounces a night  . Drug use: No  . Sexual activity: Not Currently  Other Topics Concern  . Not on file  Social History Narrative   MD ROSTER:   GI [name]   GS - Young      Daily Caffeine Use:  2   Owner/operater of dental lab-sold, but continues to work. Fully retired.   Married 1969   2 daughters 1972,  1976; 1 son 58   7 grandchildren   Edu: 10th Grade   Hobby: car restoration: has '66 Vette, two classic Chevelle SS's and is restoring a '37 chevy coup   Regular Exercise -  NO   Diet: good water, fruits/vegetables daily   Social Determinants of Health   Financial Resource Strain: Low Risk   . Difficulty of Paying Living Expenses: Not very hard  Food Insecurity: No Food Insecurity  . Worried About Charity fundraiser in the  Last Year: Never true  . Ran Out of Food in the Last Year: Never true  Transportation Needs: No Transportation Needs  . Lack of Transportation (Medical): No  . Lack of Transportation (Non-Medical): No  Physical Activity: Inactive  . Days of Exercise per Week: 0 days  . Minutes of Exercise per Session: 0 min  Stress: No Stress Concern Present  . Feeling of Stress : Not at all  Social Connections: Not on file  Intimate Partner Violence: Not At Risk  . Fear of Current or Ex-Partner: No  . Emotionally Abused: No  . Physically Abused: No  . Sexually Abused: No    Review of Systems: See HPI, otherwise negative ROS  Physical Exam: BP (!) 157/88   Pulse 77   Temp (!) 97 F (36.1 C) (Temporal)   Resp 16   Ht 5\' 11"  (1.803 m)   Wt 92.5 kg   SpO2 98%   BMI 28.45 kg/m  General:   Alert,  pleasant and cooperative in NAD Head:  Normocephalic and atraumatic. Lungs:  Clear to auscultation.    Heart:  Regular rate and rhythm.   Impression/Plan: [name] is here for ophthalmic surgery.  Risks, benefits, limitations, and alternatives regarding ophthalmic surgery have been reviewed with the patient.  Questions have been answered.  All parties agreeable.   Leandrew Koyanagi, MD  12/15/2020, 10:48 AM

## 2020-12-15 NOTE — Anesthesia Preprocedure Evaluation (Signed)
Anesthesia Evaluation  Patient identified by MRN, date of birth, ID band Patient awake    Reviewed: Allergy & Precautions, H&P , NPO status , Patient's Chart, lab work & pertinent test results  Airway Mallampati: II  TM Distance: >3 FB Neck ROM: full    Dental no notable dental hx.    Pulmonary COPD, former smoker,    Pulmonary exam normal breath sounds clear to auscultation       Cardiovascular hypertension, Normal cardiovascular exam Rhythm:regular Rate:Normal  HLD   Neuro/Psych    GI/Hepatic GERD  ,  Endo/Other  diabetes, Type 2, Insulin Dependent  Renal/GU      Musculoskeletal  (+) Arthritis ,   Abdominal   Peds  Hematology   Anesthesia Other Findings   Reproductive/Obstetrics                             Anesthesia Physical  Anesthesia Plan  ASA: II  Anesthesia Plan: MAC   Post-op Pain Management:    Induction: Intravenous  PONV Risk Score and Plan: 1 and Treatment may vary due to age or medical condition, TIVA and Midazolam  Airway Management Planned: Natural Airway and Nasal Cannula  Additional Equipment:   Intra-op Plan:   Post-operative Plan:   Informed Consent: I have reviewed the patients History and Physical, chart, labs and discussed the procedure including the risks, benefits and alternatives for the proposed anesthesia with the patient or authorized representative who has indicated his/her understanding and acceptance.     Dental Advisory Given  Plan Discussed with: CRNA  Anesthesia Plan Comments: (Blood glucose 60 in preop (correlated with patient's monitor). Given 1/2 amp D50 and blood glucose 150 on patient's monitor (20 minutes after D50). )        Anesthesia Quick Evaluation

## 2020-12-15 NOTE — Transfer of Care (Signed)
Immediate Anesthesia Transfer of Care Note  Patient: Anthony [name]  Procedure(s) Performed: CATARACT EXTRACTION PHACO AND INTRAOCULAR LENS PLACEMENT ([facility]) RIGHT VIVITY TORIC LENS DIABETIC (Left Eye)  Patient Location: PACU  Anesthesia Type: MAC  Level of Consciousness: awake, alert  and patient cooperative  Airway and Oxygen Therapy: Patient Spontanous Breathing and Patient connected to supplemental oxygen  Post-op Assessment: Post-op Vital signs reviewed, Patient's Cardiovascular Status Stable, Respiratory Function Stable, Patent Airway and No signs of Nausea or vomiting  Post-op Vital Signs: Reviewed and stable  Complications: No complications documented.

## 2020-12-15 NOTE — Anesthesia Procedure Notes (Signed)
Procedure Name: MAC Date/Time: 12/15/2020 11:17 AM Performed by: Silvana Newness, CRNA Pre-anesthesia Checklist: Patient identified, Emergency Drugs available, Suction available, Patient being monitored and Timeout performed Patient Re-evaluated:Patient Re-evaluated prior to induction Oxygen Delivery Method: Nasal cannula Placement Confirmation: positive ETCO2

## 2020-12-15 NOTE — Op Note (Signed)
LOCATION:  [place]   PREOPERATIVE DIAGNOSIS:  Nuclear sclerotic cataract of the left eye.  H25.12  POSTOPERATIVE DIAGNOSIS:  Nuclear sclerotic cataract of the left eye.   PROCEDURE:  Phacoemulsification with Toric posterior chamber intraocular lens placement of the left eye.  Ultrasound time: Procedure(s) with comments: CATARACT EXTRACTION PHACO AND INTRAOCULAR LENS PLACEMENT ([facility])  VIVITY TORIC LENS DIABETIC LEFT EYE (Left) - 3.47 1:03.8 5.5% LENS: [id number] 22.5 D vivity Toric intraocular lens with 1.5 diopters of cylindrical power with axis orientation at 12 degrees.     SURGEON:  Wyonia Hough, MD   ANESTHESIA:  Topical with tetracaine drops and 2% Xylocaine jelly, augmented with 1% preservative-free intracameral lidocaine.  COMPLICATIONS:  None.   DESCRIPTION OF PROCEDURE:  The patient was identified in the holding room and transported to the operating suite and placed in the supine position under the operating microscope.  The left eye was identified as the operative eye, and it was prepped and draped in the usual sterile ophthalmic fashion.    A clear-corneal paracentesis incision was made at the 1:30 position.  0.5 ml of preservative-free 1% lidocaine was injected into the anterior chamber. The anterior chamber was filled with Viscoat.  A 2.4 millimeter near clear corneal incision was then made at the 10:30 position.  A cystotome and capsulorrhexis forceps were then used to make a curvilinear capsulorrhexis.  Hydrodissection and hydrodelineation were then performed using balanced salt solution.   Phacoemulsification was then used in stop and chop fashion to remove the lens, nucleus and epinucleus.  The remaining cortex was aspirated using the irrigation and aspiration handpiece.  Provisc viscoelastic was then placed into the capsular bag to distend it for lens placement.  The Verion digital marker was used to align the implant at the intended axis.   A 22.5  diopter lens was then injected into the capsular bag.  It was rotated clockwise until the axis marks on the lens were approximately 15 degrees in the counterclockwise direction to the intended alignment.  The viscoelastic was aspirated from the eye using the irrigation aspiration handpiece.  Then, a Koch spatula through the sideport incision was used to rotate the lens in a clockwise direction until the axis markings of the intraocular lens were lined up with the Verion alignment.  Balanced salt solution was then used to hydrate the wounds. Cefuroxime 0.1 ml of a 10mg /ml solution was injected into the anterior chamber for a dose of 1 mg of intracameral antibiotic at the completion of the case.    The eye was noted to have a physiologic pressure and there was no wound leak noted.   Timolol and Brimonidine drops were applied to the eye.  The patient was taken to the recovery room in stable condition having had no complications of anesthesia or surgery.  [name] 12/15/2020, 11:38 AM

## 2020-12-15 NOTE — Anesthesia Postprocedure Evaluation (Signed)
Anesthesia Post Note  Patient: Anthony [name]  Procedure(s) Performed: CATARACT EXTRACTION PHACO AND INTRAOCULAR LENS PLACEMENT ([facility])  VIVITY TORIC LENS DIABETIC LEFT EYE (Left Eye)     Patient location during evaluation: PACU Anesthesia Type: MAC Level of consciousness: awake and alert and oriented Pain management: satisfactory to patient Vital Signs Assessment: post-procedure vital signs reviewed and stable Respiratory status: spontaneous breathing, nonlabored ventilation and respiratory function stable Cardiovascular status: blood pressure returned to baseline and stable Postop Assessment: Adequate PO intake and No signs of nausea or vomiting Anesthetic complications: no   No complications documented.  Raliegh Ip

## 2020-12-16 ENCOUNTER — Other Ambulatory Visit: Payer: Self-pay

## 2020-12-16 ENCOUNTER — Encounter: Payer: Self-pay | Admitting: Ophthalmology

## 2020-12-16 ENCOUNTER — Ambulatory Visit: Payer: Medicare HMO | Admitting: Internal Medicine

## 2020-12-16 VITALS — BP 128/78 | HR 81 | Ht 71.0 in | Wt 204.2 lb

## 2020-12-16 DIAGNOSIS — Z794 Long term (current) use of insulin: Secondary | ICD-10-CM

## 2020-12-16 DIAGNOSIS — E113299 Type 2 diabetes mellitus with mild nonproliferative diabetic retinopathy without macular edema, unspecified eye: Secondary | ICD-10-CM

## 2020-12-16 DIAGNOSIS — E663 Overweight: Secondary | ICD-10-CM

## 2020-12-16 DIAGNOSIS — E785 Hyperlipidemia, unspecified: Secondary | ICD-10-CM
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
## 2020-12-16 NOTE — Patient Instructions (Addendum)
Please continue: - Metformin ER 500 mg daily - Ozempic 1 mg weekly  Try to increase: - Tresiba 30 units daily   Please use: - Novolog 15 minutes before each meal: - 12 units before small meals - 14 units before regular meals - 16 units before larger meals   Please return in 3-4 months with your sugar log.

## 2020-12-20 NOTE — Progress Notes (Addendum)
Patient left voicemail over the weekend stating that he used his last Ozempic 1 mg pen. Acute form completed and turned in to [facility] pharmacy for review and delivery of 12/23/20. Patient injects on Sundays. He is aware of delivery date.   Patient also requesting refill of Tresiba. At last office visit with Dr. Cruzita Lederer she advised him to take 30 units to combat increasing BGL overnight. Requested refill from Dr. Arman Filter office to reflect dose increase.    Follow-Up:  Coordination of Enhanced Pharmacy Services and Pharmacist Review  Debbora Dus, CPP notified  [name], [place] Pharmacy Assistant [phone]  I have reviewed the care management and care coordination activities outlined in this encounter and I am certifying that I agree with the content of this note. No further action required.  Debbora Dus, PharmD Clinical Pharmacist [facility] at [facility] [phone]

## 2020-12-23 ENCOUNTER — Other Ambulatory Visit: Payer: Self-pay | Admitting: Internal Medicine

## 2020-12-29 ENCOUNTER — Ambulatory Visit: Payer: Medicare HMO | Admitting: Podiatry

## 2021-01-07 ENCOUNTER — Telehealth: Payer: Self-pay

## 2021-01-07 NOTE — Chronic Care Management (AMB) (Addendum)
Chronic Care Management Pharmacy Assistant   Name: Anthony [name]  MRN: [id number] DOB: 1946-03-19  Reason for Encounter: Medication Adherence and Delivery Coordination   Recent office visits:  None since last CCM contact  Recent consult visits:  12/16/20- [name]- Endocrinology- Patient decreased to 1 metformin daily due to diarrhea - self directed. Increase Tresiba to 30 units daily. Follow up in 3-4 months. 12/15/20- Dr. Salomon Fick-  Ophthalmology- Procedure- CATARACT EXTRACTION PHACO AND INTRAOCULAR LENS PLACEMENT ([facility])  VIVITY TORIC LENS DIABETIC LEFT EYE.  Hospital visits:  None in previous 6 months  Medications: Outpatient Encounter Medications as of 01/07/2021  Medication Sig Note   amLODipine (NORVASC) 5 MG tablet Take 1 tablet (5 mg total) by mouth daily.    b complex vitamins tablet Take 1 tablet by mouth daily.    Continuous Blood Gluc Transmit (DEXCOM G6 TRANSMITTER) MISC 1 Device by Does not apply route every 3 (three) months.    Dextrose, Diabetic Use, (GLUCOSE PO) Take 1 tablet by mouth as needed.    glucagon (GLUCAGON EMERGENCY) 1 MG injection Inject 1 mg into the muscle once as needed for up to 1 dose.    glucose blood (ONETOUCH ULTRA) test strip USE AS INSTRUCTED 4X A DAY. DAY SUPPLY PER INSURANCE.    insulin aspart (NOVOLOG FLEXPEN) 100 UNIT/ML FlexPen Inject 10-18 Units into the skin 3 (three) times daily with meals. 12/15/2020: 14 units this am    Insulin Pen Needle (ADVOCATE INSULIN PEN NEEDLES) 31G X 5 MM MISC Use 3x a day    metFORMIN (GLUCOPHAGE-XR) 500 MG 24 hr tablet TAKE 2 TABLETS (1,000 MG TOTAL) BY MOUTH DAILY. TAKE 2 TABLETS BY MOUTH TWICE DAILY WITH MEALS (Patient taking differently: 500 mg daily with breakfast.)    olmesartan-hydrochlorothiazide (BENICAR HCT) 40-25 MG tablet Take 1 tablet by mouth daily.    pantoprazole (PROTONIX) 40 MG tablet Take 1 tablet (40 mg total) by mouth daily.    Semaglutide, 1 MG/DOSE, (OZEMPIC, 1  MG/DOSE,) 4 MG/3ML SOPN Inject 1 mg into the skin once a week.    simvastatin (ZOCOR) 20 MG tablet TAKE 1 TABLET BY MOUTH EVERYDAY AT BEDTIME    tadalafil (CIALIS) 20 MG tablet Take 0.5-1 tablets (10-20 mg total) by mouth every other day as needed for erectile dysfunction.    TRESIBA FLEXTOUCH 200 UNIT/ML FlexTouch Pen INJECT 32 UNITS into THE SKIN DAILY    No facility-administered encounter medications on file as of 01/07/2021.   BP Readings from Last 3 Encounters:  12/16/20 128/78  12/15/20 114/83  12/02/20 114/70    Lab Results  Component Value Date   HGBA1C 8.0 (A) 10/27/2020    Patient obtains medications through Vials  30 Days   Patient is due for first adherence delivery/sync date on: 01/17/21  Spoke with patient on 01/10/21 and reviewed medications and coordinated delivery.  This delivery to include: Vials  30 Days  Vials Ozempic 1 mg (4mg /46mL) inject into the skin once a week Pantoprazole 40 mg- 1 tablet daily Amlodipine 5 mg- 1 tablet daily Tresiba Flex 200 unit- Inject 32 units subcutaneously daily (30 units per Dr. Cruzita Lederer) Novolog flex pen 100 unit/mL- Inject 10-18 units into the skin 3 times daily with meals.  Olmesartan-hydrochlorothiazide 40-25 mg 1 tablet daily  Simvastatin 20 mg 1 tablet daily  Patient declined the following medications this month: Tadalafil 20 mg - 1/2 to 1 tablet daily as needed- PRN use Sildenafil 100 mg- 1/2 to 1 tablet daily  as needed Metformin 500 mg 1 tablet daily-   States he has at least one months worth on hand.  One Touch ultra test strips- Use as directed 4 times daily (acute form turned in for 01/10/21 delivery)  Refills requested from PCP include:  Simvastatin and Olemesartan-Hydrochlorothiazide  Confirmed delivery date of 01/17/21, advised patient that pharmacy will contact him the morning of delivery.  Recent blood pressure readings are as follows: Not checking Recent blood glucose readings are as follows: Freestyle Libre -  continues to have highs and lows Fasting: 300 at 4:00 am 01/10/21 injected 14 units of novolog.  After Meals: 108- 01/10/21 at 9:00 am  Patient stated he did need a refill on his Dexcom sensors. Patient reminded he will need to contact [facility] at [phone] to request refills.  Patient states he has lost 25 pounds since starting Ozempic.   Follow-Up:  Coordination of Enhanced Pharmacy Services and Pharmacist Review  Debbora Dus, CPP notified  [name], [place] Pharmacy Assistant [phone]  I have reviewed the care management and care coordination activities outlined in this encounter and I am certifying that I agree with the content of this note. No further action required.  Debbora Dus, PharmD Clinical Pharmacist [place] [facility] at [facility] [phone]

## 2021-01-10 ENCOUNTER — Telehealth: Payer: Self-pay

## 2021-01-10 ENCOUNTER — Other Ambulatory Visit: Payer: Self-pay

## 2021-01-10 ENCOUNTER — Ambulatory Visit: Payer: Medicare HMO | Admitting: Podiatry

## 2021-01-10 ENCOUNTER — Other Ambulatory Visit: Payer: Self-pay | Admitting: Internal Medicine

## 2021-01-10 ENCOUNTER — Encounter: Payer: Self-pay | Admitting: Podiatry

## 2021-01-10 DIAGNOSIS — M2012 Hallux valgus (acquired), left foot: Secondary | ICD-10-CM | POA: Diagnosis not present

## 2021-01-10 DIAGNOSIS — M2011 Hallux valgus (acquired), right foot: Secondary | ICD-10-CM | POA: Diagnosis not present

## 2021-01-10 DIAGNOSIS — Z794 Long term (current) use of insulin: Secondary | ICD-10-CM

## 2021-01-10 DIAGNOSIS — M79675 Pain in left toe(s): Secondary | ICD-10-CM | POA: Diagnosis not present

## 2021-01-10 DIAGNOSIS — M79674 Pain in right toe(s): Secondary | ICD-10-CM | POA: Diagnosis not present

## 2021-01-10 DIAGNOSIS — M2041 Other hammer toe(s) (acquired), right foot: Secondary | ICD-10-CM | POA: Diagnosis not present

## 2021-01-10 DIAGNOSIS — B351 Tinea unguium: Secondary | ICD-10-CM | POA: Diagnosis not present

## 2021-01-10 DIAGNOSIS — M2042 Other hammer toe(s) (acquired), left foot: Secondary | ICD-10-CM

## 2021-01-10 DIAGNOSIS — E1142 Type 2 diabetes mellitus with diabetic polyneuropathy: Secondary | ICD-10-CM

## 2021-01-10 DIAGNOSIS — E113299 Type 2 diabetes mellitus with mild nonproliferative diabetic retinopathy without macular edema, unspecified eye: Secondary | ICD-10-CM

## 2021-01-10 MED ORDER — OLMESARTAN MEDOXOMIL-HCTZ 40-25 MG PO TABS: 1.0000 | ORAL_TABLET | Freq: Every day | ORAL | 0 refills | Status: DC

## 2021-01-10 NOTE — Telephone Encounter (Signed)
-----   Message from [place] sent at 01/10/2021  9:34 AM EDT ----- Regarding: Refills Patient needs refills on Simvastatin and Olmesartan-Hydrochlorothiazide. If appropriate please send to [facility] pharmacy.   Thank you,  [name], [place] Pharmacy Assistant [phone]

## 2021-01-10 NOTE — Telephone Encounter (Signed)
E-scribed refill 

## 2021-01-10 NOTE — Progress Notes (Signed)
This patient returns to my office for at risk foot care.  This patient requires this care by a professional since this patient will be at risk due to having diabetes.  This patient is unable to cut nails himself since the patient cannot reach his nails.These nails are painful walking and wearing shoes.  This patient presents for at risk foot care today.  General Appearance  Alert, conversant and in no acute stress.  Vascular  Dorsalis pedis and posterior tibial  pulses are palpable  bilaterally.  Capillary return is within normal limits  bilaterally. Temperature is within normal limits  bilaterally.  Neurologic  Senn-Weinstein monofilament wire test within normal limits  bilaterally. Muscle power within normal limits bilaterally.  Nails Thick disfigured discolored nails with subungual debris  from hallux to fifth toes bilaterally. No evidence of bacterial infection or drainage bilaterally.  Orthopedic  No limitations of motion  feet .  No crepitus or effusions noted.  HAV  B/L.    Skin  normotropic skin with no porokeratosis noted bilaterally.  No signs of infections or ulcers noted.     Onychomycosis  Pain in right toes  Pain in left toes  Consent was obtained for treatment procedures.   Mechanical debridement of nails 1-5  bilaterally performed with a nail nipper.  Filed with dremel without incident.    Return office visit     4 months                 Told patient to return for periodic foot care and evaluation due to potential at risk complications.   Gardiner Barefoot DPM

## 2021-01-12 ENCOUNTER — Other Ambulatory Visit: Payer: Self-pay

## 2021-01-12 ENCOUNTER — Telehealth: Payer: Self-pay

## 2021-01-12 ENCOUNTER — Ambulatory Visit: Payer: Medicare HMO

## 2021-01-12 DIAGNOSIS — Z794 Long term (current) use of insulin: Secondary | ICD-10-CM | POA: Diagnosis not present

## 2021-01-12 DIAGNOSIS — E113291 Type 2 diabetes mellitus with mild nonproliferative diabetic retinopathy without macular edema, right eye: Secondary | ICD-10-CM | POA: Diagnosis not present

## 2021-01-12 NOTE — Telephone Encounter (Signed)
Called patient to complete AWV. Wife answered the phone and stated that patient was not at home right now. Stated she will let him know that I had called. Appointment was cancelled.

## 2021-01-13 MED ORDER — SIMVASTATIN 20 MG PO TABS: ORAL_TABLET | ORAL | 0 refills | Status: DC

## 2021-01-13 NOTE — Telephone Encounter (Signed)
E-scribed refill 

## 2021-01-13 NOTE — Telephone Encounter (Signed)
Lattie Haw, can you send simvastatin as well? We only received the olmesartan-HCTZ.  Debbora Dus, PharmD Clinical Pharmacist [place] [facility] at [facility] [phone]

## 2021-01-13 NOTE — Addendum Note (Signed)
Addended by: Brenton Grills on: 0/45/9977 41:42 PM   Modules accepted: Orders

## 2021-01-16 ENCOUNTER — Other Ambulatory Visit: Payer: Self-pay | Admitting: Family Medicine

## 2021-01-20 ENCOUNTER — Ambulatory Visit (INDEPENDENT_AMBULATORY_CARE_PROVIDER_SITE_OTHER): Payer: Medicare HMO

## 2021-01-20 ENCOUNTER — Other Ambulatory Visit: Payer: Self-pay

## 2021-01-20 DIAGNOSIS — I10 Essential (primary) hypertension: Secondary | ICD-10-CM

## 2021-01-20 DIAGNOSIS — Z794 Long term (current) use of insulin: Secondary | ICD-10-CM

## 2021-01-20 DIAGNOSIS — E113299 Type 2 diabetes mellitus with mild nonproliferative diabetic retinopathy without macular edema, unspecified eye: Secondary | ICD-10-CM

## 2021-01-20 NOTE — Progress Notes (Addendum)
Chronic Care Management Pharmacy Note  01/20/2021 Name:  Anthony [name] MRN:  [id number] DOB:  18-Jul-1946  Subjective: Anthony [name] is an 75 y.o. year old male who is a primary patient of [name], MD.  The CCM team was consulted for assistance with disease management and care coordination needs.    Engaged with patient by telephone for follow up visit in response to provider referral for pharmacy case management and/or care coordination services. Patient reports 2 episodes of severe hypoglycemia in the past couple of weeks requiring emergent treatment. Reports he forgot to eat and next thing he remembered he woke up and was connected to IVs. He is currently holding Ozempic due to cost (in coverage gap). His total med cost for the month would've been over $500 if he had gotten the Ozempic. Able to afford insulin at this time.  Consent to Services:  The patient was given information about Chronic Care Management services, agreed to services, and gave verbal consent prior to initiation of services.  Please see initial visit note for detailed documentation.   Patient Care Team: [name], MD as PCP - General (Family Medicine) [name], MD (Neurosurgery) [name], MD as Consulting Physician (General Surgery) [name], MD (Inactive) as Consulting Physician (Urology) Thelma Comp, [place] as Consulting Physician (Optometry) Debbora Dus, [facility] as Pharmacist (Pharmacist)  Office Visits:  10/27/20: PCP - Continue current medications   07/27/20: Larene Beach called pt to discuss dexcom education, asked pt to download app and call to schedule appt with nurse for education  07/27/20: PCP/Gutierrez - brittle diabetes, schedule follow up with endo, Dexcom sample, self-tapering meds, unclear about Novolog dose   01/20/20: PCP/Gutierrez - suspect right inguinal hernia; numbness in left arm - suggest vitamin B12 trial for 1 month; LDL at goal on simvastatin; GERD on PPI;  referral for cancer screening; HTN stable; DM uncontrolled. Encouraged endo f/u; BPH stable, chronic, s/p TURP   Consult Visit:   12/16/20- [name]- Endocrinology- Patient decreased to 1 metformin daily due to diarrhea - self directed. Increase Tresiba to 30 units daily. Follow up in 3-4 months.  08/12/20: Endocrinology - take Novolog 15 min before meals, keep log of when BG is high (missed doses, etc), continue Tresiba 32 units daily, metformin ER 500 mg daily, Novolog sliding scale - 12 units small meals, 16 regular meals, 18 large meals   06/02/20: Podiatry - annual DM foot exam   02/25/20: Pulmonology - lung cancer screening program  Hospital visits: None in previous 6 months  Objective:  Lab Results  Component Value Date   CREATININE 1.02 03/02/2020   BUN 12 03/02/2020   GFR 81.99 01/12/2020   GFRNONAA >60 03/02/2020   GFRAA >60 03/02/2020   NA 139 03/02/2020   K 3.6 03/02/2020   CALCIUM 9.0 03/02/2020   CO2 29 03/02/2020   GLUCOSE 185 (H) 03/02/2020    Lab Results  Component Value Date/Time   HGBA1C 8.0 (A) 10/27/2020 10:56 AM   HGBA1C 7.5 (A) 07/27/2020 03:13 PM   HGBA1C 7.7 (H) 03/02/2020 10:26 AM   HGBA1C 7.8 (H) 01/12/2020 10:21 AM   GFR 81.99 01/12/2020 10:21 AM   GFR 81.20 01/13/2019 10:29 AM   MICROALBUR 6.3 (H) 06/28/2015 09:39 AM   MICROALBUR 4.4 (H) 06/26/2014 09:05 AM    Last diabetic Eye exam:  Lab Results  Component Value Date/Time   HMDIABEYEEXA No Retinopathy 10/12/2020 12:00 AM   HMDIABEYEEXA No Retinopathy 10/12/2020 12:00 AM    Last  diabetic Foot exam:  Lab Results  Component Value Date/Time   HMDIABFOOTEX done by podiatry - in chart 05/19/2019 12:00 AM     Lab Results  Component Value Date   CHOL 132 08/31/2020   HDL 43 08/31/2020   LDLCALC 69 08/31/2020   LDLDIRECT 94.0 10/05/2016   TRIG 112 08/31/2020   CHOLHDL 3.1 08/31/2020    Hepatic Function Latest Ref Rng & Units 01/12/2020 01/13/2019 01/01/2018  Total Protein 6.0  - 8.3 g/dL 6.0 5.9(L) 6.0  Albumin 3.5 - 5.2 g/dL 4.1 3.9 4.1  AST 0 - 37 U/L 15 14 13   ALT 0 - 53 U/L 13 14 13   Alk Phosphatase 39 - 117 U/L 93 78 68  Total Bilirubin 0.2 - 1.2 mg/dL 0.8 0.7 0.8  Bilirubin, Direct 0.0 - 0.3 mg/dL - - -   CBC Latest Ref Rng & Units 03/02/2020 01/05/2017 07/21/2013  WBC 4.0 - 10.5 K/uL 8.5 5.7 6.1  Hemoglobin 13.0 - 17.0 g/dL 14.5 14.2 15.5  Hematocrit 39.0 - 52.0 % 42.8 41.3 43.6  Platelets 150 - 400 K/uL 227 209.0 204    No results found for: VD25OH  Clinical ASCVD: Yes  The 10-year ASCVD risk score Mikey Bussing DC Jr., et al., 2013) is: 36.9%   Values used to calculate the score:     Age: 89 years     Sex: Male     Is Non-Hispanic African American: No     Diabetic: Yes     Tobacco smoker: No     Systolic Blood Pressure: 109 mmHg     Is BP treated: Yes     HDL Cholesterol: 43 mg/dL     Total Cholesterol: 132 mg/dL    Depression screen [facility] 2/9 01/12/2020 01/09/2019 01/01/2018  Decreased Interest 0 0 0  Down, Depressed, Hopeless 0 0 0  PHQ - 2 Score 0 0 0  Altered sleeping 0 0 0  Tired, decreased energy 0 0 0  Change in appetite 0 0 0  Feeling bad or failure about yourself  0 0 0  Trouble concentrating 0 0 0  Moving slowly or fidgety/restless 0 0 0  Suicidal thoughts 0 0 0  PHQ-9 Score 0 0 0  Difficult doing work/chores Not difficult at all Not difficult at all Not difficult at all  Some recent data might be hidden      Social History   Tobacco Use  Smoking Status Former Smoker  . Packs/day: 0.30  . Years: 50.00  . Pack years: 15.00  . Types: Cigarettes  . Quit date: 08/29/2011  . Years since quitting: 9.4  Smokeless Tobacco Never Used   BP Readings from Last 3 Encounters:  12/16/20 128/78  12/15/20 114/83  12/02/20 114/70   Pulse Readings from Last 3 Encounters:  12/16/20 81  12/15/20 81  12/02/20 87   Wt Readings from Last 3 Encounters:  12/16/20 204 lb 3.2 oz (92.6 kg)  12/15/20 204 lb (92.5 kg)  12/02/20 202 lb (91.6 kg)    BMI Readings from Last 3 Encounters:  12/16/20 28.48 kg/m  12/15/20 28.45 kg/m  12/02/20 28.17 kg/m    Assessment/Interventions: Review of patient past medical history, allergies, medications, health status, including review of consultants reports, laboratory and other test data, was performed as part of comprehensive evaluation and provision of chronic care management services.   SDOH:  (Social Determinants of Health) assessments and interventions performed: Yes SDOH Interventions   Flowsheet Row Most Recent Value  SDOH Interventions  Financial Strain Interventions Other (Comment)  [Apply for Ozempic assistance in coverage gap]     SDOH Screenings   Alcohol Screen: Not on file  Depression (VVO1-6): Not on file  Financial Resource Strain: Medium Risk  . Difficulty of Paying Living Expenses: Somewhat hard  Food Insecurity: Not on file  Housing: Not on file  Physical Activity: Not on file  Social Connections: Not on file  Stress: Not on file  Tobacco Use: Medium Risk  . Smoking Tobacco Use: Former Smoker  . Smokeless Tobacco Use: Never Used  Transportation Needs: Not on file    [place]  No Known Allergies  Medications Reviewed Today    Reviewed by Debbora Dus, [facility] (Pharmacist) on 01/20/21 at 65  Med List Status: <None>  Medication Order Taking? Sig Documenting Provider Last Dose Status Informant  amLODipine (NORVASC) 5 MG tablet [id number] Yes Take 1 tablet (5 mg total) by mouth daily. [name], MD Taking Active Self  b complex vitamins tablet [id number] Yes Take 1 tablet by mouth daily. [provider] Taking Active Self  Continuous Blood Gluc Transmit (DEXCOM G6 TRANSMITTER) MISC [id number] Yes 1 Device by Does not apply route every 3 (three) months. [name], MD Taking Active   Dextrose, Diabetic Use, (GLUCOSE PO) [id number] Yes Take 1 tablet by mouth as needed. [provider] Taking Active Self  glucagon (GLUCAGON  EMERGENCY) 1 MG injection [id number] Yes Inject 1 mg into the muscle once as needed for up to 1 dose. [name], MD Taking Active Self  glucose blood [facility] ULTRA) test strip [id number] Yes USE AS DIRECTED FOUR TIMES DAILY Dx code E11.65 [name], MD Taking Active   insulin aspart (NOVOLOG FLEXPEN) 100 UNIT/ML FlexPen [id number] Yes Inject 10-18 Units into the skin 3 (three) times daily with meals. [name], MD Taking Active            Med Note Colbert Ewing, Mirian Mo   Wed Dec 15, 2020  9:59 AM) 14 units this am   Insulin Pen Needle (ADVOCATE INSULIN PEN NEEDLES) 31G X 5 MM MISC [id number] Yes Use 3x a day [name], MD Taking Active Self  metFORMIN (GLUCOPHAGE-XR) 500 MG 24 hr tablet [id number] Yes TAKE 2 TABLETS (1,000 MG TOTAL) BY MOUTH DAILY. TAKE 2 TABLETS BY MOUTH TWICE DAILY WITH MEALS  Patient taking differently: 500 mg daily with breakfast.   [name], MD Taking Active Self  olmesartan-hydrochlorothiazide (BENICAR HCT) 40-25 MG tablet [id number] Yes Take 1 tablet by mouth daily. [name], MD Taking Active   pantoprazole (PROTONIX) 40 MG tablet [id number] Yes Take 1 tablet (40 mg total) by mouth daily. [name], MD Taking Active Self  Semaglutide, 1 MG/DOSE, (OZEMPIC, 1 MG/DOSE,) 4 MG/3ML SOPN [id number] No Inject 1 mg into the skin once a week.  Patient not taking: Reported on 01/20/2021   [name], MD Not Taking Active Self  simvastatin (ZOCOR) 20 MG tablet [id number] Yes TAKE 1 TABLET BY MOUTH EVERYDAY AT BEDTIME [name], MD Taking Active   tadalafil (CIALIS) 20 MG tablet [id number] Yes Take 0.5-1 tablets (10-20 mg total) by mouth every other day as needed for erectile dysfunction. [name], MD Taking Active   TRESIBA FLEXTOUCH 200 UNIT/ML FlexTouch Pen [id number] Yes INJECT 32 UNITS into THE SKIN DAILY  Patient taking differently: Inject 30 Units into the skin.   [name], MD Taking Active            Patient Active Problem List   Diagnosis Date  Noted  . Non-adherence to medical treatment 10/27/2020  . COPD (chronic obstructive pulmonary disease) ([place]) 02/28/2020  . Coronary artery calcification seen on CAT scan 02/28/2020  . Aortic atherosclerosis ([place]) 02/28/2020  . Ex-smoker 01/21/2020  . RLQ abdominal pain 01/21/2020  . Numbness and tingling in left arm 01/21/2020  . Health maintenance examination 01/05/2017  . Right knee pain 01/18/2016  . Barrett's esophagus 12/08/2015  . Abnormal tympanic membrane of right ear 07/05/2015  . Advanced care planning/counseling discussion 06/30/2014  . Medicare annual wellness visit, subsequent 06/22/2013  . Benign prostatic hyperplasia with urinary obstruction 06/18/2013  . ED (erectile dysfunction) 04/10/2012  . Hyperlipidemia associated with type 2 diabetes mellitus ([place]) 12/29/2008  . LEG PAIN, BILATERAL 11/19/2008  . 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 ANXIETY 07/24/2007  . GERD 07/24/2007    Immunization History  Administered Date(s) Administered  . Fluad Quad(high Dose 65+) 06/21/2020  . Influenza Whole 06/02/2008, 06/02/2009, 06/02/2010, 07/08/2011, 07/08/2012  . Influenza, High Dose Seasonal PF 05/18/2017, 06/25/2018, 04/18/2019  . Influenza,inj,Quad PF,6+ Mos 07/05/2015  . Influenza-Unspecified 05/28/2013, 05/28/2014, 06/30/2016  . PFIZER(Purple Top)SARS-COV-2 Vaccination 10/27/2019, 11/25/2019  . Pneumococcal Conjugate-13 06/18/2013  . Pneumococcal Polysaccharide-23 07/08/2011, 12/29/2016  . Tetanus 06/18/2013  . Zoster 08/26/2014  . Zoster Recombinat (Shingrix) 05/18/2017, 01/14/2018    Conditions to be addressed/monitored:  Hypertension and Diabetes  Care Plan : [place]  Updates made by Debbora Dus, [facility] since 01/20/2021 12:00 AM    Problem: CHL AMB "PATIENT-SPECIFIC PROBLEM"     Long-Range Goal: Disease Management   Start Date:  11/23/2020  Priority: High  Note:     Current Barriers:  . Unable to achieve control of diabetes  . Non-compliance with insulin regimen  . Frequent hypoglycemia . Unable to afford treatment regimen - in coverage gap as of 12/2020  Pharmacist Clinical Goal(s):  [name] Kitchen Over the next 30 days, patient will achieve adherence to monitoring guidelines and medication adherence to achieve therapeutic efficacy through collaboration with PharmD and provider.   Interventions: . 1:1 collaboration with [name], MD regarding development and update of comprehensive plan of care as evidenced by provider attestation and co-signature . Inter-disciplinary care team collaboration (see longitudinal plan of care) . Comprehensive medication review performed; medication list updated in electronic medical record  Diabetes (A1c goal <7%) -Uncontrolled  -Patient has lapsed in diabetes care - taking Novolog for correction of high BG instead of pre-meal and often skipping his [name] at night. Two recent episodes of severe hypoglycemia resulting in unconsciousness. He is still keeping track of BG with dexcom and writing down how much insulin he is taking which I affirmed this practice. He is down another 15 lbs to 190 lbs per his scale. His appetite is very low which may be contributing to the hypoglycemia. He states he feels good when BG is > 150 - 200. We will try to keep his fasting BG > 150.  He reports the following insulin and BG log from past 5 days:  Monday 01/17/21 - 270 fasting, 230, 83, 267 around lunch - took Novolog 14 units twice (in response to BG > 250, not prior to meals), did not take Tresiba  Tuesday 01/18/21 - 300 fasting, Novolog 14 units - BG went down to 153, 181, 192 - calibrated unit - BG went up to 214, 232, 230, took Novolog 14 units, Tresiba 24 (took twice this day??) BG 230 at bedtime  Wednesday 01/19/21 - 223 Fasting,  Novolog 14 units - went down to 74, 70, 102, 195 235 Novolog 14 units  again, after dinner 260 at bedtime, took [name] 24 units at bedtime, BG went up to 300 in middle of night, took 14 units Novolog at 3 AM   Thursday 01/20/21 - Fasting 167 -Current medications:  Metformin 500 mg - 1 tablet daily before breakfast (pt taking but still having some diarrhea)  Insulin aspart (NovoLog) - 08-11-15 units, 15 minutes before meals (pt taking differently: 14 units when BG < 250)  Tresiba - 30 units once daily at bedtime (pt taking differently: 24 units at bedtime)  Ozempic 1 mg - Inject once weekly on Mondays (on hold as of this past Monday 01/17/21 due to cost) -Medications previously tried: upset stomach on higher dose metformin  -He continues to wear Dexcom 24/7 without gaps. His son, daughter, and I have access to live readings. -Current meal patterns: reports no longer eating three meals per day, he still has a low carb pre-packaged meals provided. He reports his appetite is so low, he often does not want to eat and skips meals. I encouraged him to have a least 3 snacks at routine times daily. -Current exercise: minimal -Educated on: Prevention and management of hypoglycemic episodes -Plan: Reviewed proper use of Novolog - take 15 minutes before meals, NOT after or for correction.Take Tresiba 30 units every day as prescribed. Try moving metformin to supper time.  Patient wrote this down and will be followed closely. Apply for Ozempic PAP.   Hypertension Controlled - per clinic readings within goal Current medications:  Amlodipine 5 mg - 1 tablet daily (1 breakfast)  Olmesartan-hydrochlorothiazide 40-25 mg - 1 tablet daily (1 breakfast) Updates/changes 01/20/21 - patient confirms daily adherence  Recommended taking medications as prescribed  Patient Goals/Self-Care Activities . Over the next 30 days, patient will:  - take medications as prescribed -monitor glucose with Dexcom daily, document, and provide at future appointments  Follow Up Plan: 1 week - CCM  visit PCP 03/08/21, Endocrinology visit on 03/15/21   There are no care plans that you recently modified to display for this patient.   Medication Assistance: Application for Ozempic  medication assistance program. in process.  Anticipated assistance start date 02/09/21.  See plan of care for additional detail.     Patient's preferred pharmacy is:  [facility] - [place], [place] - [address] [place] [address] Phone: [phone] Fax: [phone]  [id number] [place] [address] [place] [place]: [phone]: [phone] Uses pill box? Yes  Endorses 100% compliance His medications are synced as of 01/20/21 Easy open caps, vials  [facility] Medication Reviewed:  Amlodipine 5 mg - 1 tablet daily (1 breakfast)  Metformin 500 mg XR - 1 tablet daily (1 breakfast)  Olmesartan-hydrochlorothiazide 40-25 mg - 1 tablet daily (1 breakfast)  Pantoprazole 40 mg - 1 tablet daily (1 breakfast)  Simvastatin 20 mg - 1 tablet daily (1 breakfast)  Ozempic 1 mg - Inject once weekly on Monday (on hold due to cost)  TTyler Aas- Inject 30 units at bedtime  Novolog - Inject 12-16 units before meals three times daily  Tadalafil 20 mg - takes as needed  B-Complex - 1 tablet daily (1 breakfast)  Care Plan and Follow Up Patient Decision:  Patient agrees to Care Plan and Follow-up.  MDebbora Dus PharmD Clinical Pharmacist [facility]  [place] [phone]

## 2021-01-20 NOTE — Patient Instructions (Addendum)
Dear Anthony [name],  Below is a summary of the goals we discussed during our follow up appointment on Jan 20, 2021. Please contact me anytime with questions or concerns.   Visit Information  Patient Care Plan: CCM Pharmacy Care Plan    Problem Identified: [facility] AMB "PATIENT-SPECIFIC PROBLEM"     Long-Range Goal: Disease Management   Start Date: 11/23/2020  Priority: High  Note:     Current Barriers:  . Unable to achieve control of diabetes  . Non-compliance with insulin regimen  . Frequent hypoglycemia . Unable to afford treatment regimen - in coverage gap as of 12/2020  Pharmacist Clinical Goal(s):  [name] Kitchen Over the next 30 days, patient will achieve adherence to monitoring guidelines and medication adherence to achieve therapeutic efficacy through collaboration with PharmD and provider.   Interventions: . 1:1 collaboration with Anthony Bush, MD regarding development and update of comprehensive plan of care as evidenced by provider attestation and co-signature . Inter-disciplinary care team collaboration (see longitudinal plan of care) . Comprehensive medication review performed; medication list updated in electronic medical record  Diabetes (A1c goal <7%) -Uncontrolled  -Patient has lapsed in diabetes care - taking Novolog for correction of high BG instead of pre-meal and often skipping his [name] at night. Two recent episodes of severe hypoglycemia resulting in unconsciousness. He is still keeping track of BG with dexcom and writing down how much insulin he is taking which I affirmed this practice. He is down another 15 lbs to 190 lbs per his scale. His appetite is very low which may be contributing to the hypoglycemia. He states he feels good when BG is > 150 - 200. We will try to keep his fasting BG > 150.  He reports the following insulin and BG log from past 5 days:  Monday 01/17/21 - 270 fasting, 230, 83, 267 around lunch - took Novolog 14 units twice (in response to BG >  250, not prior to meals), did not take Tresiba  Tuesday 01/18/21 - 300 fasting, Novolog 14 units - BG went down to 153, 181, 192 - calibrated unit - BG went up to 214, 232, 230, took Novolog 14 units, Tresiba 24 (took twice this day??) BG 230 at bedtime  Wednesday 01/19/21 - 223 Fasting, Novolog 14 units - went down to 74, 70, 102, 195 235 Novolog 14 units again, after dinner 260 at bedtime, took [name] 24 units at bedtime, BG went up to 300 in middle of night, took 14 units Novolog at 3 AM   Thursday 01/20/21 - Fasting 167 -Current medications:  Metformin 500 mg - 1 tablet daily before breakfast (pt taking but still having some diarrhea)  Insulin aspart (NovoLog) - 08-11-15 units, 15 minutes before meals (pt taking differently: 14 units when BG < 250)  Tresiba - 30 units once daily at bedtime (pt taking differently: 24 units at bedtime)  Ozempic 1 mg - Inject once weekly on Mondays (on hold as of this past Monday 01/17/21 due to cost) -Medications previously tried: upset stomach on higher dose metformin  -He continues to wear Dexcom 24/7 without gaps. His son, daughter, and I have access to live readings. -Current meal patterns: reports no longer eating three meals per day, he still has a low carb pre-packaged meals provided. He reports his appetite is so low, he often does not want to eat and skips meals. I encouraged him to have a least 3 snacks at routine times daily. -Current exercise: minimal -Educated on: Prevention and management  of hypoglycemic episodes -Plan: Reviewed proper use of Novolog - take 15 minutes before meals, NOT after or for correction.Take Tresiba 30 units every day as prescribed. Try moving metformin to supper time.  Patient wrote this down and will be followed closely. Apply for Ozempic PAP.   Hypertension Controlled - per clinic readings within goal Current medications:  Amlodipine 5 mg - 1 tablet daily (1 breakfast)  Olmesartan-hydrochlorothiazide 40-25 mg - 1  tablet daily (1 breakfast) Updates/changes 01/20/21 - patient confirms daily adherence  Recommended taking medications as prescribed  Patient Goals/Self-Care Activities . Over the next 30 days, patient will:  - take medications as prescribed -monitor glucose with Dexcom daily, document, and provide at future appointments  Follow Up Plan: 1 week - CCM visit PCP 03/08/21, Endocrinology visit on 03/15/21   There are no care plans that you recently modified to display for this patient.   Medication Assistance: Application for Ozempic  medication assistance program. in process.  Anticipated assistance start date 02/09/21.  See plan of care for additional detail.      The patient verbalized understanding of instructions, educational materials, and care plan provided today and agreed to receive a mailed copy of patient instructions, educational materials, and care plan.   Anthony [name], PharmD Clinical Pharmacist [facility] [phone]  Preventing Hypoglycemia Hypoglycemia occurs when the level of sugar (glucose) in the blood is too low. Hypoglycemia can happen in people who do or do not have diabetes (diabetes mellitus). It can develop quickly, and it can be a medical emergency. For most people with diabetes, a blood glucose level below 70 mg/dL (3.9 mmol/L) is considered hypoglycemia. Glucose is a type of sugar that provides the body's main source of energy. Certain hormones (insulin and glucagon) control the level of glucose in the blood. Insulin lowers blood glucose, and glucagon increases blood glucose. Hypoglycemia can result from having too much insulin in the bloodstream, or from not eating enough food that contains glucose. Your risk for hypoglycemia is higher:  If you take insulin or diabetes medicines to help lower your blood glucose or help your body make more insulin.  If you skip or delay a meal or snack.  If you are ill.  During and after  exercise. You can prevent hypoglycemia by working with your health care provider to adjust your meal plan as needed and by taking other precautions. How can hypoglycemia affect me? Mild symptoms Mild hypoglycemia may not cause any symptoms. If you do have symptoms, they may include:  Hunger.  Anxiety.  Sweating and feeling clammy.  Dizziness or feeling light-headed.  Sleepiness.  Nausea.  Increased heart rate.  Headache.  Blurry vision.  Irritability.  Tingling or numbness around the mouth, lips, or tongue.  A change in coordination.  Restless sleep. If mild hypoglycemia is not recognized and treated, it can quickly become moderate or severe hypoglycemia. Moderate symptoms Moderate hypoglycemia can cause:  Mental confusion and poor judgment.  Behavior changes.  Weakness.  Irregular heartbeat. Severe symptoms Severe hypoglycemia is a medical emergency. It can cause:  Fainting.  Seizures.  Loss of consciousness (coma).  Death. What nutrition changes can be made?  Work with your health care provider or diet and nutrition specialist (dietitian) to make a healthy meal plan that is right for you. Follow your meal plan carefully.  Eat meals at regular times.  If recommended by your health care provider, have snacks between meals.  Donot skip or delay meals or  snacks. You can be at risk for hypoglycemia if you are not getting enough carbohydrates. What lifestyle changes can be made?  Work closely with your health care provider to manage your blood glucose. Make sure you know: ? Your goal blood glucose levels. ? How and when to check your blood glucose. ? The symptoms of hypoglycemia. It is important to treat it right away to keep it from becoming severe.  Do not drink alcohol on an empty stomach.  When you are ill, check your blood glucose more often than usual. Follow your sick day plan whenever you cannot eat or drink normally. Make this plan in advance  with your health care provider.  Always check your blood glucose before, during, and after exercise.   How is this treated? This condition can often be treated by immediately eating or drinking something that contains sugar, such as:  Fruit juice, 4-6 oz (120-150 mL).  Regular (not diet) soda, 4-6 oz (120-150 mL).  Low-fat milk, 4 oz (120 mL).  Several pieces of hard candy.  Sugar or honey, 1 Tbsp (15 mL). Treating hypoglycemia if you have diabetes If you are alert and able to swallow safely, follow the 15:15 rule:  Take 15 grams of a rapid-acting carbohydrate. Talk with your health care provider about how much you should take.  Rapid-acting options include: ? Glucose pills (take 15 grams). ? 6-8 pieces of hard candy. ? 4-6 oz (120-150 mL) of fruit juice. ? 4-6 oz (120-150 mL) of regular (not diet) soda.  Check your blood glucose 15 minutes after you take the carbohydrate.  If the repeat blood glucose level is still at or below 70 mg/dL (3.9 mmol/L), take 15 grams of a carbohydrate again.  If your blood glucose level does not increase above 70 mg/dL (3.9 mmol/L) after 3 tries, seek emergency medical care.  After your blood glucose level returns to normal, eat a meal or a snack within 1 hour. Treating severe hypoglycemia Severe hypoglycemia is when your blood glucose level is at or below 54 mg/dL (3 mmol/L). Severe hypoglycemia is a medical emergency. Get medical help right away. If you have severe hypoglycemia and you cannot eat or drink, you may need an injection of glucagon. A family member or close friend should learn how to check your blood glucose and how to give you a glucagon injection. Ask your health care provider if you need to have an emergency glucagon injection kit available. Severe hypoglycemia may need to be treated in a hospital. The treatment may include getting glucose through an IV. You may also need treatment for the cause of your hypoglycemia. Where to find  more information  American Diabetes Association: www.diabetes.[employer] of Diabetes and Digestive and Kidney Diseases: [employer] Contact a health care provider if:  You have problems keeping your blood glucose in your target range.  You have frequent episodes of hypoglycemia. Get help right away if:  You continue to have hypoglycemia symptoms after eating or drinking something containing glucose.  Your blood glucose level is at or below 54 mg/dL (3 mmol/L).  You faint.  You have a seizure. These symptoms may represent a serious problem that is an emergency. Do not wait to see if the symptoms will go away. Get medical help right away. Call your local emergency services (911 in the [place] Summary  Know the symptoms of hypoglycemia, and when you are at risk for it (such as during exercise or when you are sick). Check your  blood glucose often when you are at risk for hypoglycemia.  Hypoglycemia can develop quickly, and it can be dangerous if it is not treated right away. If you have a history of severe hypoglycemia, make sure you know how to use your glucagon injection kit.  Make sure you know how to treat hypoglycemia. Keep a carbohydrate snack available when you may be at risk for hypoglycemia. This information is not intended to replace advice given to you by your health care provider. Make sure you discuss any questions you have with your health care provider. Document Revised: 12/06/2018 Document Reviewed: 04/11/2017 Elsevier Patient Education  2021 [employer].

## 2021-01-20 NOTE — Progress Notes (Signed)
Patient assistance application for Ozempic has been completed on behalf of the patient.  Patient would like to come by Dr. Bosie Clos office to sign. Patient aware he will need to provide requested income verification to be sent the manufacturer.    Debbora Dus, CPP notified  [name], [place] [facility] Assistant [phone]

## 2021-01-27 ENCOUNTER — Telehealth: Payer: Medicare HMO

## 2021-01-27 ENCOUNTER — Telehealth: Payer: Self-pay

## 2021-01-27 DIAGNOSIS — E162 Hypoglycemia, unspecified: Secondary | ICD-10-CM

## 2021-01-27 DIAGNOSIS — Z794 Long term (current) use of insulin: Secondary | ICD-10-CM

## 2021-01-27 DIAGNOSIS — R413 Other amnesia: Secondary | ICD-10-CM

## 2021-01-27 DIAGNOSIS — E113299 Type 2 diabetes mellitus with mild nonproliferative diabetic retinopathy without macular edema, unspecified eye: Secondary | ICD-10-CM

## 2021-01-27 NOTE — Progress Notes (Deleted)
Chronic Care Management Pharmacy Note  01/27/2021 Name:  Anthony [name] MRN:  [id number] DOB:  Dec 04, 1945  Subjective: Anthony [name] is an 75 y.o. year old male who is a primary patient of [name], MD.  The CCM team was consulted for assistance with disease management and care coordination needs.    Engaged with patient by telephone for follow up visit in response to provider referral for pharmacy case management and/or care coordination services. Patient reports 2 episodes of severe hypoglycemia in the past couple of weeks requiring emergent treatment. Reports he forgot to eat and next thing he remembered he woke up and was connected to IVs. He is currently holding Ozempic due to cost (in coverage gap). His total med cost for the month would've been over $500 if he had gotten the Ozempic. Able to afford insulin at this time.  Consent to Services:  The patient was given information about Chronic Care Management services, agreed to services, and gave verbal consent prior to initiation of services.  Please see initial visit note for detailed documentation.   Patient Care Team: [name], MD as PCP - General (Family Medicine) [name], MD (Neurosurgery) [name], MD as Consulting Physician (General Surgery) [name], MD (Inactive) as Consulting Physician (Urology) Thelma Comp, [place] as Consulting Physician (Optometry) Debbora Dus, [facility] as Pharmacist (Pharmacist)  Office Visits:  10/27/20: PCP - Continue current medications   07/27/20: Larene Beach called pt to discuss dexcom education, asked pt to download app and call to schedule appt with nurse for education  07/27/20: PCP/Gutierrez - brittle diabetes, schedule follow up with endo, Dexcom sample, self-tapering meds, unclear about Novolog dose   01/20/20: PCP/Gutierrez - suspect right inguinal hernia; numbness in left arm - suggest vitamin B12 trial for 1 month; LDL at goal on simvastatin; GERD on PPI;  referral for cancer screening; HTN stable; DM uncontrolled. Encouraged endo f/u; BPH stable, chronic, s/p TURP   Consult Visit:   12/16/20- [name]- Endocrinology- Patient decreased to 1 metformin daily due to diarrhea - self directed. Increase Tresiba to 30 units daily. Follow up in 3-4 months.  08/12/20: Endocrinology - take Novolog 15 min before meals, keep log of when BG is high (missed doses, etc), continue Tresiba 32 units daily, metformin ER 500 mg daily, Novolog sliding scale - 12 units small meals, 16 regular meals, 18 large meals   06/02/20: Podiatry - annual DM foot exam   02/25/20: Pulmonology - lung cancer screening program  Hospital visits: None in previous 6 months  Objective:  Lab Results  Component Value Date   CREATININE 1.02 03/02/2020   BUN 12 03/02/2020   GFR 81.99 01/12/2020   GFRNONAA >60 03/02/2020   GFRAA >60 03/02/2020   NA 139 03/02/2020   K 3.6 03/02/2020   CALCIUM 9.0 03/02/2020   CO2 29 03/02/2020   GLUCOSE 185 (H) 03/02/2020    Lab Results  Component Value Date/Time   HGBA1C 8.0 (A) 10/27/2020 10:56 AM   HGBA1C 7.5 (A) 07/27/2020 03:13 PM   HGBA1C 7.7 (H) 03/02/2020 10:26 AM   HGBA1C 7.8 (H) 01/12/2020 10:21 AM   GFR 81.99 01/12/2020 10:21 AM   GFR 81.20 01/13/2019 10:29 AM   MICROALBUR 6.3 (H) 06/28/2015 09:39 AM   MICROALBUR 4.4 (H) 06/26/2014 09:05 AM    Last diabetic Eye exam:  Lab Results  Component Value Date/Time   HMDIABEYEEXA No Retinopathy 10/12/2020 12:00 AM   HMDIABEYEEXA No Retinopathy 10/12/2020 12:00 AM    Last  diabetic Foot exam:  Lab Results  Component Value Date/Time   HMDIABFOOTEX done by podiatry - in chart 05/19/2019 12:00 AM     Lab Results  Component Value Date   CHOL 132 08/31/2020   HDL 43 08/31/2020   LDLCALC 69 08/31/2020   LDLDIRECT 94.0 10/05/2016   TRIG 112 08/31/2020   CHOLHDL 3.1 08/31/2020    Hepatic Function Latest Ref Rng & Units 01/12/2020 01/13/2019 01/01/2018  Total Protein 6.0  - 8.3 g/dL 6.0 5.9(L) 6.0  Albumin 3.5 - 5.2 g/dL 4.1 3.9 4.1  AST 0 - 37 U/L 15 14 13   ALT 0 - 53 U/L 13 14 13   Alk Phosphatase 39 - 117 U/L 93 78 68  Total Bilirubin 0.2 - 1.2 mg/dL 0.8 0.7 0.8  Bilirubin, Direct 0.0 - 0.3 mg/dL - - -   CBC Latest Ref Rng & Units 03/02/2020 01/05/2017 07/21/2013  WBC 4.0 - 10.5 K/uL 8.5 5.7 6.1  Hemoglobin 13.0 - 17.0 g/dL 14.5 14.2 15.5  Hematocrit 39.0 - 52.0 % 42.8 41.3 43.6  Platelets 150 - 400 K/uL 227 209.0 204    No results found for: VD25OH  Clinical ASCVD: Yes  The 10-year ASCVD risk score Mikey Bussing DC Jr., et al., 2013) is: 36.9%   Values used to calculate the score:     Age: 60 years     Sex: Male     Is Non-Hispanic African American: No     Diabetic: Yes     Tobacco smoker: No     Systolic Blood Pressure: 100 mmHg     Is BP treated: Yes     HDL Cholesterol: 43 mg/dL     Total Cholesterol: 132 mg/dL    Depression screen [facility] 2/9 01/12/2020 01/09/2019 01/01/2018  Decreased Interest 0 0 0  Down, Depressed, Hopeless 0 0 0  PHQ - 2 Score 0 0 0  Altered sleeping 0 0 0  Tired, decreased energy 0 0 0  Change in appetite 0 0 0  Feeling bad or failure about yourself  0 0 0  Trouble concentrating 0 0 0  Moving slowly or fidgety/restless 0 0 0  Suicidal thoughts 0 0 0  PHQ-9 Score 0 0 0  Difficult doing work/chores Not difficult at all Not difficult at all Not difficult at all  Some recent data might be hidden      Social History   Tobacco Use  Smoking Status Former Smoker  . Packs/day: 0.30  . Years: 50.00  . Pack years: 15.00  . Types: Cigarettes  . Quit date: 08/29/2011  . Years since quitting: 9.4  Smokeless Tobacco Never Used   BP Readings from Last 3 Encounters:  12/16/20 128/78  12/15/20 114/83  12/02/20 114/70   Pulse Readings from Last 3 Encounters:  12/16/20 81  12/15/20 81  12/02/20 87   Wt Readings from Last 3 Encounters:  12/16/20 204 lb 3.2 oz (92.6 kg)  12/15/20 204 lb (92.5 kg)  12/02/20 202 lb (91.6 kg)    BMI Readings from Last 3 Encounters:  12/16/20 28.48 kg/m  12/15/20 28.45 kg/m  12/02/20 28.17 kg/m    Assessment/Interventions: Review of patient past medical history, allergies, medications, health status, including review of consultants reports, laboratory and other test data, was performed as part of comprehensive evaluation and provision of chronic care management services.   SDOH:  (Social Determinants of Health) assessments and interventions performed: Yes  SDOH Screenings   Alcohol Screen: Not on file  Depression (FHQ1-9): Not  on file  Financial Resource Strain: Medium Risk  . Difficulty of Paying Living Expenses: Somewhat hard  Food Insecurity: Not on file  Housing: Not on file  Physical Activity: Not on file  Social Connections: Not on file  Stress: Not on file  Tobacco Use: Medium Risk  . Smoking Tobacco Use: Former Smoker  . Smokeless Tobacco Use: Never Used  Transportation Needs: Not on file    Comfort  No Known Allergies  Medications Reviewed Today    Reviewed by Debbora Dus, [facility] (Pharmacist) on 01/20/21 at 56  Med List Status: <None>  Medication Order Taking? Sig Documenting Provider Last Dose Status Informant  amLODipine (NORVASC) 5 MG tablet [id number] Yes Take 1 tablet (5 mg total) by mouth daily. [name], MD Taking Active Self  b complex vitamins tablet [id number] Yes Take 1 tablet by mouth daily. [provider] Taking Active Self  Continuous Blood Gluc Transmit (DEXCOM G6 TRANSMITTER) MISC [id number] Yes 1 Device by Does not apply route every 3 (three) months. [name], MD Taking Active   Dextrose, Diabetic Use, (GLUCOSE PO) [id number] Yes Take 1 tablet by mouth as needed. [provider] Taking Active Self  glucagon (GLUCAGON EMERGENCY) 1 MG injection [id number] Yes Inject 1 mg into the muscle once as needed for up to 1 dose. [name], MD Taking Active Self  glucose blood [facility] ULTRA) test  strip [id number] Yes USE AS DIRECTED FOUR TIMES DAILY Dx code E11.65 [name], MD Taking Active   insulin aspart (NOVOLOG FLEXPEN) 100 UNIT/ML FlexPen [id number] Yes Inject 10-18 Units into the skin 3 (three) times daily with meals. [name], MD Taking Active            Med Note Colbert Ewing, Mirian Mo   Wed Dec 15, 2020  9:59 AM) 14 units this am   Insulin Pen Needle (ADVOCATE INSULIN PEN NEEDLES) 31G X 5 MM MISC [id number] Yes Use 3x a day [name], MD Taking Active Self  metFORMIN (GLUCOPHAGE-XR) 500 MG 24 hr tablet [id number] Yes TAKE 2 TABLETS (1,000 MG TOTAL) BY MOUTH DAILY. TAKE 2 TABLETS BY MOUTH TWICE DAILY WITH MEALS  Patient taking differently: 500 mg daily with breakfast.   [name], MD Taking Active Self  olmesartan-hydrochlorothiazide (BENICAR HCT) 40-25 MG tablet [id number] Yes Take 1 tablet by mouth daily. [name], MD Taking Active   pantoprazole (PROTONIX) 40 MG tablet [id number] Yes Take 1 tablet (40 mg total) by mouth daily. [name], MD Taking Active Self  Semaglutide, 1 MG/DOSE, (OZEMPIC, 1 MG/DOSE,) 4 MG/3ML SOPN [id number] No Inject 1 mg into the skin once a week.  Patient not taking: Reported on 01/20/2021   [name], MD Not Taking Active Self  simvastatin (ZOCOR) 20 MG tablet [id number] Yes TAKE 1 TABLET BY MOUTH EVERYDAY AT BEDTIME [name], MD Taking Active   tadalafil (CIALIS) 20 MG tablet [id number] Yes Take 0.5-1 tablets (10-20 mg total) by mouth every other day as needed for erectile dysfunction. [name], MD Taking Active   TRESIBA FLEXTOUCH 200 UNIT/ML FlexTouch Pen [id number] Yes INJECT 32 UNITS into THE SKIN DAILY  Patient taking differently: Inject 30 Units into the skin.   [name], MD Taking Active           Patient Active Problem List   Diagnosis Date Noted  . Non-adherence to medical treatment 10/27/2020  . COPD (chronic obstructive pulmonary disease) ([place]) 02/28/2020  .  Coronary artery calcification seen on CAT scan 02/28/2020  . Aortic  atherosclerosis ([place]) 02/28/2020  . Ex-smoker 01/21/2020  . RLQ abdominal pain 01/21/2020  . Numbness and tingling in left arm 01/21/2020  . Health maintenance examination 01/05/2017  . Right knee pain 01/18/2016  . Barrett's esophagus 12/08/2015  . Abnormal tympanic membrane of right ear 07/05/2015  . Advanced care planning/counseling discussion 06/30/2014  . Medicare annual wellness visit, subsequent 06/22/2013  . Benign prostatic hyperplasia with urinary obstruction 06/18/2013  . ED (erectile dysfunction) 04/10/2012  . Hyperlipidemia associated with type 2 diabetes mellitus ([place]) 12/29/2008  . LEG PAIN, BILATERAL 11/19/2008  . 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 ANXIETY 07/24/2007  . GERD 07/24/2007    Immunization History  Administered Date(s) Administered  . Fluad Quad(high Dose 65+) 06/21/2020  . Influenza Whole 06/02/2008, 06/02/2009, 06/02/2010, 07/08/2011, 07/08/2012  . Influenza, High Dose Seasonal PF 05/18/2017, 06/25/2018, 04/18/2019  . Influenza,inj,Quad PF,6+ Mos 07/05/2015  . Influenza-Unspecified 05/28/2013, 05/28/2014, 06/30/2016  . PFIZER(Purple Top)SARS-COV-2 Vaccination 10/27/2019, 11/25/2019  . Pneumococcal Conjugate-13 06/18/2013  . Pneumococcal Polysaccharide-23 07/08/2011, 12/29/2016  . Tetanus 06/18/2013  . Zoster Recombinat (Shingrix) 05/18/2017, 01/14/2018  . Zoster, Live 08/26/2014    Conditions to be addressed/monitored:  Hypertension and Diabetes  There are no care plans that you recently modified to display for this patient.  Patient Care Plan: CCM Pharmacy Care Plan    Problem Identified: CHL AMB "PATIENT-SPECIFIC PROBLEM"     Long-Range Goal: Disease Management   Start Date: 11/23/2020  Priority: High  Note:     Current Barriers:  . Unable to achieve control of diabetes  . Non-compliance with insulin  regimen  . Frequent hypoglycemia . Unable to afford treatment regimen - in coverage gap as of 12/2020  Pharmacist Clinical Goal(s):  [name] Kitchen Over the next 30 days, patient will achieve adherence to monitoring guidelines and medication adherence to achieve therapeutic efficacy through collaboration with PharmD and provider.   Interventions: . 1:1 collaboration with [name], MD regarding development and update of comprehensive plan of care as evidenced by provider attestation and co-signature . Inter-disciplinary care team collaboration (see longitudinal plan of care) . Comprehensive medication review performed; medication list updated in electronic medical record  Diabetes (A1c goal <7%) -Uncontrolled  -Patient has lapsed in diabetes care - taking Novolog for correction of high BG instead of pre-meal and often skipping his [name] at night. Two recent episodes of severe hypoglycemia resulting in unconsciousness. He is still keeping track of BG with dexcom and writing down how much insulin he is taking which I affirmed this practice. He is down another 15 lbs to 190 lbs per his scale. His appetite is very low which may be contributing to the hypoglycemia. He states he feels good when BG is > 150 - 200. We will try to keep his fasting BG > 150.  He reports the following insulin and BG log from past 5 days:  Monday 01/17/21 - 270 fasting, 230, 83, 267 around lunch - took Novolog 14 units twice (in response to BG > 250, not prior to meals), did not take Tresiba  Tuesday 01/18/21 - 300 fasting, Novolog 14 units - BG went down to 153, 181, 192 - calibrated unit - BG went up to 214, 232, 230, took Novolog 14 units, Tresiba 24 (took twice this day??) BG 230 at bedtime  Wednesday 01/19/21 - 223 Fasting, Novolog 14 units - went down to 74, 70, 102, 195 235 Novolog 14 units again, after dinner 260 at bedtime, took [name] 24  units at bedtime, BG went up to 300 in middle of night, took 14 units Novolog at 3  AM   Thursday 01/20/21 - Fasting 167 -Current medications:  Metformin 500 mg - 1 tablet daily before breakfast (pt taking but still having some diarrhea)  Insulin aspart (NovoLog) - 08-11-15 units, 15 minutes before meals (pt taking differently: 14 units when BG < 250)  Tresiba - 30 units once daily at bedtime (pt taking differently: 24 units at bedtime)  Ozempic 1 mg - Inject once weekly on Mondays (on hold as of this past Monday 01/17/21 due to cost) -Medications previously tried: upset stomach on higher dose metformin  -He continues to wear Dexcom 24/7 without gaps. His son, daughter, and I have access to live readings. -Current meal patterns: reports no longer eating three meals per day, he still has a low carb pre-packaged meals provided. He reports his appetite is so low, he often does not want to eat and skips meals. I encouraged him to have a least 3 snacks at routine times daily. -Current exercise: minimal -Educated on: Prevention and management of hypoglycemic episodes -Plan: Reviewed proper use of Novolog - take 15 minutes before meals, NOT after or for correction.Take Tresiba 30 units every day as prescribed. Try moving metformin to supper time.  Patient wrote this down and will be followed closely. Apply for Ozempic PAP.   Hypertension Controlled - per clinic readings within goal Current medications:  Amlodipine 5 mg - 1 tablet daily (1 breakfast)  Olmesartan-hydrochlorothiazide 40-25 mg - 1 tablet daily (1 breakfast) Updates/changes 01/20/21 - patient confirms daily adherence  Recommended taking medications as prescribed  Patient Goals/Self-Care Activities . Over the next 30 days, patient will:  - take medications as prescribed -monitor glucose with Dexcom daily, document, and provide at future appointments  Follow Up Plan: 1 week - CCM visit PCP 03/08/21, Endocrinology visit on 03/15/21   There are no care plans that you recently modified to display for this patient.    Medication Assistance: Application for Ozempic  medication assistance program. in process.  Anticipated assistance start date 02/09/21.  See plan of care for additional detail.     Patient's preferred pharmacy is:  [facility] - [place], [place] - [address] [place] [address] Phone: [phone] Fax: [phone]  [id number] [place] [id number]: [phone]: [phone] Uses pill box? Yes  Endorses 100% compliance His medications are synced as of 01/20/21 Easy open caps, vials  [facility] Medication Reviewed:  Amlodipine 5 mg - 1 tablet daily (1 breakfast)  Metformin 500 mg XR - 1 tablet daily (1 breakfast)  Olmesartan-hydrochlorothiazide 40-25 mg - 1 tablet daily (1 breakfast)  Pantoprazole 40 mg - 1 tablet daily (1 breakfast)  Simvastatin 20 mg - 1 tablet daily (1 breakfast)  Ozempic 1 mg - Inject once weekly on Monday (on hold due to cost)  TTyler Aas- Inject 30 units at bedtime  Novolog - Inject 12-16 units before meals three times daily  Tadalafil 20 mg - takes as needed  B-Complex - 1 tablet daily (1 breakfast)  Care Plan and Follow Up Patient Decision:  Patient agrees to Care Plan and Follow-up.  MDebbora Dus PharmD Clinical Pharmacist [phone]

## 2021-02-08 ENCOUNTER — Telehealth: Payer: Self-pay

## 2021-02-08 MED ORDER — AMLODIPINE BESYLATE 5 MG PO TABS: 5.0000 mg | ORAL_TABLET | Freq: Every day | ORAL | 0 refills | Status: DC

## 2021-02-08 MED ORDER — TADALAFIL 20 MG PO TABS: 10.0000 mg | ORAL_TABLET | ORAL | 0 refills | Status: DC | PRN

## 2021-02-08 MED ORDER — PANTOPRAZOLE SODIUM 40 MG PO TBEC: 40.0000 mg | DELAYED_RELEASE_TABLET | Freq: Every day | ORAL | 0 refills | Status: DC

## 2021-02-08 NOTE — Telephone Encounter (Signed)
E-scribed refills.  

## 2021-02-08 NOTE — Telephone Encounter (Signed)
-----   Message from [place] sent at 02/08/2021 11:10 AM EDT ----- Regarding: Refill Patient needs refills on the following medications, please send to [facility] if appropriate. Pantoprazole Amlodipine Tadalafil 20 mg  Thank you, [name]

## 2021-02-08 NOTE — Chronic Care Management (AMB) (Addendum)
Chronic Care Management Pharmacy Assistant   Name: Anthony [name]  MRN: [id number] DOB: Jul 23, 1946  Reason for Encounter: Medication Adherence and Delivery Coordination   Recent office visits:  None since last CCM visit  Recent consult visits:  None since last CCM visit  Hospital visits:  None in previous 6 months  Medications: Outpatient Encounter Medications as of 02/08/2021  Medication Sig Note   amLODipine (NORVASC) 5 MG tablet Take 1 tablet (5 mg total) by mouth daily.    b complex vitamins tablet Take 1 tablet by mouth daily.    Continuous Blood Gluc Transmit (DEXCOM G6 TRANSMITTER) MISC 1 Device by Does not apply route every 3 (three) months.    Dextrose, Diabetic Use, (GLUCOSE PO) Take 1 tablet by mouth as needed.    glucagon (GLUCAGON EMERGENCY) 1 MG injection Inject 1 mg into the muscle once as needed for up to 1 dose.    glucose blood (ONETOUCH ULTRA) test strip USE AS DIRECTED FOUR TIMES DAILY Dx code E11.65    insulin aspart (NOVOLOG FLEXPEN) 100 UNIT/ML FlexPen Inject 10-18 Units into the skin 3 (three) times daily with meals. 12/15/2020: 14 units this am    Insulin Pen Needle (ADVOCATE INSULIN PEN NEEDLES) 31G X 5 MM MISC Use 3x a day    metFORMIN (GLUCOPHAGE-XR) 500 MG 24 hr tablet TAKE 2 TABLETS (1,000 MG TOTAL) BY MOUTH DAILY. TAKE 2 TABLETS BY MOUTH TWICE DAILY WITH MEALS (Patient taking differently: 500 mg daily with breakfast.)    olmesartan-hydrochlorothiazide (BENICAR HCT) 40-25 MG tablet Take 1 tablet by mouth daily.    pantoprazole (PROTONIX) 40 MG tablet Take 1 tablet (40 mg total) by mouth daily.    Semaglutide, 1 MG/DOSE, (OZEMPIC, 1 MG/DOSE,) 4 MG/3ML SOPN Inject 1 mg into the skin once a week. (Patient not taking: Reported on 01/20/2021)    simvastatin (ZOCOR) 20 MG tablet TAKE 1 TABLET BY MOUTH EVERYDAY AT BEDTIME    tadalafil (CIALIS) 20 MG tablet Take 0.5-1 tablets (10-20 mg total) by mouth every other day as needed for erectile dysfunction.     TRESIBA FLEXTOUCH 200 UNIT/ML FlexTouch Pen INJECT 32 UNITS into THE SKIN DAILY (Patient taking differently: Inject 30 Units into the skin.)    No facility-administered encounter medications on file as of 02/08/2021.   BP Readings from Last 3 Encounters:  12/16/20 128/78  12/15/20 114/83  12/02/20 114/70    Lab Results  Component Value Date   HGBA1C 8.0 (A) 10/27/2020    No OVs, Consults, or hospital visits since last care coordination call / Pharmacist visit. No medication changes indicated  Last adherence delivery date: 01/17/21  Patient is due for next adherence delivery on: 02/17/21  Spoke with patient on 02/08/21 and reviewed medications and coordinated delivery.  This delivery to include: Vials  30 Days  Olmesartan-HCTZ 40/25 mg 1 tablet daily Simvastatin 20 mg 1 tablet daily Pantoprazole  40 mg 1 tablet daily Amlodipine 5 mg 1 tablet daily Test Strips One touch ultra Use as directed 4 times daily     Novolog Flexpen Inject 10-18 units into the skin 3 times daily with meals.     Ozempic 1mg  dose Pen inject into the skin once a week (Holding as of 5/23 due to cost- we will check price again prior to sending to see if pt out of coverage gap. He has not completed PAP forms yet.)   Tadalafil 20 mg - 1/2 to 1 tablet daily as needed- PRN use  Patient declined the following medications this month:  Sildenafil 100 mg- 1/2 to 1 tablet daily as needed Metformin 500 mg 1 tablet daily-   States he has at least one months worth on hand. Still has several bottles left due to decrease in dose One Touch ultra test strips- Use as directed 4 times daily (acute form turned in for 01/10/21 delivery) [name] Flex 200 units- 30 units once daily at bedtime (pt taking differently: 24 units at bedtime) 37 day supply on 01/21/21  Refills requested from PCP include: Pantoprazole Amlodipine Novolog Flex pen Tadalafil 20 mg  Confirmed delivery date of 02/17/21, advised patient that pharmacy will  contact him the morning of delivery.  Recent blood pressure readings are as follows: No recent readings  Recent blood glucose readings are as follows: No readings available. States he has not been keeping up with his chart  Patient states since he has been off Ozempic his stomach is better. Patient states he has not signed application for Ozempic. He is still waiting on income verification.   Follow-Up:  Pharmacist Review  Debbora Dus, CPP notified  [name], [place] Assistant [phone]  I have reviewed the care management and care coordination activities outlined in this encounter and I am certifying that I agree with the content of this note. No further action required.  Debbora Dus, PharmD Clinical Pharmacist [facility] at [facility] [phone]

## 2021-02-09 NOTE — Telephone Encounter (Signed)
Contacted patient on 01/27/21 to ensure patient wellbeing due to CGM notification that patient's BG < 70.  He reports his BG dropped < 20 01/27/21 while in a doctor's office with his wife. He was given juice and it took a while for his BG to return to normal, but he did not require emergency care. He is concerned about his continued safety driving due to low BG. This is the 3rd episode of severe hypoglycemia I am aware of in the past few weeks. (See my note from 01/20/21). This seems to be correlated with his improper Novolog use and poor memory. He continues to take correction doses of Novolog when his CGM alerts him of a BG < 250 often resulting in low BG. He also forgets to take Novolog before meals and will take afterwards instead. He lowers his [name] dose or skips it due to concern for low BG recreating the cycle of highs and lows the next day - despite education on difference between the two insulins. He does use his CGM very well and has no gaps in use. He also keeps a log of his BG and writes down a T or N when he takes his insulin. His son and daughter have access to his BG readings by phone and alerts in case of hypoglycemia.   In summary, I think he needs more assistance. Recommend referral to CCM RN and SW.  Dr. Darnell Level, Please let me if you have any other recommendations for Mr. Davidow.  Sending to Dr. Cruzita Lederer for continuity of care.  Debbora Dus, PharmD Clinical Pharmacist [place] [facility] at [facility] [phone]

## 2021-02-10 DIAGNOSIS — R413 Other amnesia: Secondary | ICD-10-CM | POA: Insufficient documentation

## 2021-02-10 DIAGNOSIS — F0390 Unspecified dementia without behavioral disturbance: Secondary | ICD-10-CM | POA: Insufficient documentation

## 2021-02-10 DIAGNOSIS — G3184 Mild cognitive impairment, so stated: Secondary | ICD-10-CM | POA: Insufficient documentation

## 2021-02-10 NOTE — Telephone Encounter (Signed)
Referral is placed correctly. Forwarding to front office to schedule sooner apt.

## 2021-02-10 NOTE — Telephone Encounter (Signed)
Patient was transferred back to the office by Access nurse. I found out from patient that he sees Dr Cruzita Lederer for his diabetes. I called [facility] and was able to speak with their office and transferred patient there to be advised on how to proceed. Dr Darnell Level is out of the office.

## 2021-02-10 NOTE — Telephone Encounter (Signed)
Noted. Agree with CCM referral for RN and SW - referral placed.  Agree with no driving at this time due to recurrent hypoglycemia.  He has appt with me 02/2021 for physical. Would offer sooner appt for geriatric assessment  Will also forward to [facility] to ensure I placed referral coorrectly.

## 2021-02-10 NOTE — Telephone Encounter (Signed)
While calling the patient to schedule his appt per request he informs me that his family had to call EMS last night in the middle of the night on him. According to the patient they had to revive him. His sugar was down to 30. Today he reports that he has had to take two shots. He took one before breakfast and one before lunch. Patient reports that now its staying high around 400. Patient doesn't report any symptoms he is experiencing currently. He did say that he did deny transport to  hospital to get treatment .I sent him to triage to be evaluated due to this incident. EM

## 2021-02-10 NOTE — Telephone Encounter (Signed)
See note below access nurse note that pt had warm transfer to endo and was speaking with someone. Sending note to Dr Darnell Level as PCP and Dr Cruzita Lederer.

## 2021-02-10 NOTE — Telephone Encounter (Signed)
[facility] - Client TELEPHONE ADVICE RECORD AccessNurse Patient Name: Anthony [name] [facility] Gender: Male DOB: [birth year] Age: 75 Y 64 M Return Phone Number: [phone] (Primary), [phone] (Secondary) Address: City/ [name] [address] [name] - Client Client Site [place] - Day Physician [name] - MD Contact Type Call Who Is Calling Patient / Member / Family / Caregiver Call Type Triage / Clinical Relationship To Patient Self Return Phone Number [phone] (Primary) Chief Complaint Medication reaction Reason for Call Symptomatic / Request for Health Information Initial Comment Caller states patient is hypoglycemic, ems revived him last night, sugar was in 30's, taken 2 shots of insulin today, blood sugar is 398/400, not urgent per charge. has had to be revived multiple times. Translation No Nurse Assessment Nurse: Rolin Barry, RN, Levada Dy Date/Time (Eastern Time): 02/10/2021 3:08:00 PM Confirm and document reason for call. If symptomatic, describe symptoms. ---Caller states he is hypoglycemic, ems revived him last night, sugar was in 30's, caller refused to go to the hospital last night. taken 2 shots of insulin today, blood sugar is 398/400, not urgent per charge. has had to be revived multiple times. Current BS is 398. Does the patient have any new or worsening symptoms? ---Yes Will a triage be completed? ---Yes Related visit to physician within the last 2 weeks? ---No Does the PT have any chronic conditions? (i.e. diabetes, asthma, this includes High risk factors for pregnancy, etc.) ---Yes List chronic conditions. ---Diabetes , Insulin dependent Is this a behavioral health or substance abuse call? ---No Guidelines Guideline Title Affirmed Question Affirmed Notes Nurse Date/Time (Eastern Time) Diabetes - High Blood Sugar [1] Blood glucose > 300  mg/dL (16.7 mmol/L) AND [2] two or more times in a row [place], RN, Levada Dy 02/10/2021 3:10:17 PM PLEASE NOTE: All timestamps contained within this report are represented as [place] Standard Time. CONFIDENTIALTY NOTICE: This fax transmission is intended only for the addressee. It contains information that is legally privileged, confidential or otherwise protected from use or disclosure. If you are not the intended recipient, you are strictly prohibited from reviewing, disclosing, copying using or disseminating any of this information or taking any action in reliance on or regarding this information. If you have received this fax in error, please notify us immediately by telephone so that we can arrange for its return to [place]. Phone: [phone], Toll-Free: [phone], Fax: [phone] Page: 2 of 2 Call Id: [id number] Anthony [name]. Time Eilene Ghazi Time) Disposition Final User 02/10/2021 3:24:13 PM Call PCP Now Yes Deaton, RN, Cindee Lame Disagree/Comply Comply Caller Understands Yes PreDisposition Did not know what to do Care Advice Given Per Guideline CALL PCP NOW: * You need to discuss this with your doctor (or NP/PA). * I'll page the on-call provider now. If you haven't heard from the provider (or me) within 30 minutes, call again. CALL BACK IF: * Vomiting occurs * Rapid breathing occurs * You become worse CARE ADVICE given per Diabetes - High Blood Sugar (Adult) guideline. Comments User: Saverio Danker, RN Date/Time Eilene Ghazi Time): 02/10/2021 3:15:21 PM Caller advised that he has taken 14 units of Novolg x 2 today. User: Saverio Danker, RN Date/Time Eilene Ghazi Time): 02/10/2021 3:17:27 PM No answer on the backline. Multiple attempts. User: Saverio Danker, RN Date/Time Eilene Ghazi Time): 02/10/2021 3:25:08 PM Warm transferred caller with Helyn App at the office. Referrals Warm transfer to backline

## 2021-02-11 ENCOUNTER — Telehealth: Payer: Self-pay | Admitting: *Deleted

## 2021-02-11 ENCOUNTER — Telehealth: Payer: Self-pay | Admitting: Internal Medicine

## 2021-02-11 DIAGNOSIS — Z794 Long term (current) use of insulin: Secondary | ICD-10-CM | POA: Diagnosis not present

## 2021-02-11 DIAGNOSIS — E113291 Type 2 diabetes mellitus with mild nonproliferative diabetic retinopathy without macular edema, right eye: Secondary | ICD-10-CM | POA: Diagnosis not present

## 2021-02-11 NOTE — Telephone Encounter (Signed)
Pt calling in asking what the correct dose of medication should he take.

## 2021-02-11 NOTE — Chronic Care Management (AMB) (Signed)
  Care Management   Note  02/11/2021 Name: Anthony [name] MRN: [id number] DOB: 08-Feb-1946  Anthony [name] is a 75 y.o. year old male who is a primary care patient of [name], MD and is actively engaged with the care management team. I reached out to Anthony [name] by phone today to assist with scheduling an initial visit with the RN Case Manager Licensed Clinical Social Worker  Follow up plan: Telephone appointment with care management team RN CM and Licensed Clinical SW scheduled for: 02/15/21 and 02/16/21  Julian Hy, [place], [place] Management  Direct Dial: [phone]

## 2021-02-14 ENCOUNTER — Other Ambulatory Visit: Payer: Self-pay | Admitting: Internal Medicine

## 2021-02-15 ENCOUNTER — Encounter: Payer: Medicare HMO | Attending: Internal Medicine | Admitting: Nutrition

## 2021-02-15 ENCOUNTER — Ambulatory Visit (INDEPENDENT_AMBULATORY_CARE_PROVIDER_SITE_OTHER): Payer: Medicare HMO

## 2021-02-15 DIAGNOSIS — E785 Hyperlipidemia, unspecified: Secondary | ICD-10-CM | POA: Diagnosis not present

## 2021-02-15 DIAGNOSIS — I10 Essential (primary) hypertension: Secondary | ICD-10-CM | POA: Diagnosis not present

## 2021-02-15 DIAGNOSIS — E11649 Type 2 diabetes mellitus with hypoglycemia without coma: Secondary | ICD-10-CM | POA: Diagnosis not present

## 2021-02-15 DIAGNOSIS — E113299 Type 2 diabetes mellitus with mild nonproliferative diabetic retinopathy without macular edema, unspecified eye: Secondary | ICD-10-CM

## 2021-02-15 DIAGNOSIS — E1169 Type 2 diabetes mellitus with other specified complication: Secondary | ICD-10-CM

## 2021-02-15 DIAGNOSIS — Z794 Long term (current) use of insulin: Secondary | ICD-10-CM | POA: Diagnosis not present

## 2021-02-15 NOTE — Telephone Encounter (Signed)
Appointment scheduled with me today at [facility]

## 2021-02-15 NOTE — Telephone Encounter (Signed)
Spoke with pt and he stated that he was on his way up to the office/appt today to speak with a Nurse. Im assuming it maybe Linda.

## 2021-02-15 NOTE — Telephone Encounter (Signed)
Great! Thank you so much, Vaughan Basta!!! C

## 2021-02-15 NOTE — Chronic Care Management (AMB) (Signed)
Chronic Care Management   CCM RN Visit Note  02/16/2021 Name: Anthony [name] MRN: [id number] DOB: 1945-10-25  Subjective: Anthony [name] is a 75 y.o. year old male who is a primary care patient of [name], MD. The care management team was consulted for assistance with disease management and care coordination needs.    Engaged with patient by telephone for initial visit in response to provider referral for case management and/or care coordination services.   Consent to Services:  The patient was given the following information about Chronic Care Management services today, agreed to services, and gave verbal consent: 1. CCM service includes personalized support from designated clinical staff supervised by the primary care provider, including individualized plan of care and coordination with other care providers 2. 24/7 contact phone numbers for assistance for urgent and routine care needs. 3. Service will only be billed when office clinical staff spend 20 minutes or more in a month to coordinate care. 4. Only one practitioner may furnish and bill the service in a calendar month. 5.The patient may stop CCM services at any time (effective at the end of the month) by phone call to the office staff. 6. The patient will be responsible for cost sharing (co-pay) of up to 20% of the service fee (after annual deductible is met). Patient agreed to services and consent obtained.  Patient agreed to services and verbal consent obtained.   Assessment: Review of patient past medical history, allergies, medications, health status, including review of consultants reports, laboratory and other test data, was performed as part of comprehensive evaluation and provision of chronic care management services.   SDOH (Social Determinants of Health) assessments and interventions performed:  SDOH Interventions    Flowsheet Row Most Recent Value  SDOH Interventions   Food Insecurity Interventions  Intervention Not Indicated  Housing Interventions Intervention Not Indicated  Transportation Interventions Intervention Not Indicated        CCM Care Plan  No Known Allergies  Outpatient Encounter Medications as of 02/15/2021  Medication Sig Note   amLODipine (NORVASC) 5 MG tablet Take 1 tablet (5 mg total) by mouth daily.    b complex vitamins tablet Take 1 tablet by mouth daily.    glucagon (GLUCAGON EMERGENCY) 1 MG injection Inject 1 mg into the muscle once as needed for up to 1 dose.    insulin aspart (NOVOLOG FLEXPEN) 100 UNIT/ML FlexPen Inject 10-18 Units into the skin 3 (three) times daily with meals. 12/15/2020: 14 units this am    metFORMIN (GLUCOPHAGE-XR) 500 MG 24 hr tablet TAKE 2 TABLETS (1,000 MG TOTAL) BY MOUTH DAILY. TAKE 2 TABLETS BY MOUTH TWICE DAILY WITH MEALS (Patient taking differently: 500 mg daily with breakfast.)    olmesartan-hydrochlorothiazide (BENICAR HCT) 40-25 MG tablet Take 1 tablet by mouth daily.    pantoprazole (PROTONIX) 40 MG tablet Take 1 tablet (40 mg total) by mouth daily.    simvastatin (ZOCOR) 20 MG tablet TAKE 1 TABLET BY MOUTH EVERYDAY AT BEDTIME    tadalafil (CIALIS) 20 MG tablet Take 0.5-1 tablets (10-20 mg total) by mouth every other day as needed for erectile dysfunction.    TRESIBA FLEXTOUCH 200 UNIT/ML FlexTouch Pen INJECT 32 UNITS into THE SKIN DAILY (Patient taking differently: Inject 30 Units into the skin.)    Continuous Blood Gluc Transmit (DEXCOM G6 TRANSMITTER) MISC 1 Device by Does not apply route every 3 (three) months.    Dextrose, Diabetic Use, (GLUCOSE PO) Take 1 tablet by mouth as needed.  glucose blood (ONETOUCH ULTRA) test strip USE AS DIRECTED FOUR TIMES DAILY Dx code E11.65    Insulin Pen Needle (ADVOCATE INSULIN PEN NEEDLES) 31G X 5 MM MISC Use 3x a day    Semaglutide, 1 MG/DOSE, (OZEMPIC, 1 MG/DOSE,) 4 MG/3ML SOPN Inject 1 mg into the skin once a week. (Patient not taking: No sig reported)    No facility-administered  encounter medications on file as of 02/15/2021.    Patient Active Problem List   Diagnosis Date Noted   Memory impairment 02/10/2021   Non-adherence to medical treatment 10/27/2020   COPD (chronic obstructive pulmonary disease) ([place]) 02/28/2020   Coronary artery calcification seen on CAT scan 02/28/2020   Aortic atherosclerosis ([place]) 02/28/2020   Ex-smoker 01/21/2020   RLQ abdominal pain 01/21/2020   Numbness and tingling in left arm 01/21/2020   Health maintenance examination 01/05/2017   Right knee pain 01/18/2016   Barrett's esophagus 12/08/2015   Abnormal tympanic membrane of right ear 07/05/2015   Advanced care planning/counseling discussion 06/30/2014   Medicare annual wellness visit, subsequent 06/22/2013   Benign prostatic hyperplasia with urinary obstruction 06/18/2013   ED (erectile dysfunction) 04/10/2012   Hyperlipidemia associated with type 2 diabetes mellitus ([place]) 12/29/2008   LEG PAIN, BILATERAL 11/19/2008   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 ([place]) 07/24/2007   ANXIETY 07/24/2007   GERD 07/24/2007    Conditions to be addressed/monitored:HTN and DMII  Care Plan : Diabetes Type 2 (Adult)  Updates made by Dannielle Karvonen, RN since 02/16/2021 12:00 AM   Problem: Diabetes with hypertension management   Priority: High   Long-Range Goal: Glycemic/ blood pressure management   Start Date: 02/15/2021  This Visit's Progress: On track  Priority: High  Objective:  Lab Results  Component Value Date   HGBA1C 8.0 (A) 10/27/2020   Lab Results  Component Value Date   CREATININE 1.02 03/02/2020   CREATININE 0.91 01/12/2020   CREATININE 0.92 01/13/2019  Last practice recorded BP readings:  BP Readings from Last 3 Encounters:  12/16/20 128/78  12/15/20 114/83  12/02/20 114/70  Current Barriers: 75 year old with history of DMII and HTN. Patient states he has frequent lows blood sugars that have required EMS assistance. Patient  reports one of his blood sugars was as low as 13.  Patient reports his blood sugars range from <70 to 200's.  Patient reports he sees an endocrinologist who is working with him to manage his diabetes. He states he uses a DEXCOM for blood sugar tracking. Patient reports his most recent Hgb A1c is 8.0.  He reports his next follow up with his endocrinologist is today to discuss plan for managing the ongoing low blood sugars. Patient states he is not currently taking his Ozempic because he was in the donut whole. He states he will start taking the Ozempic next month.   Patient states his primary care provider manages his high blood pressure. He states he takes his medications as prescribed. Patient reports monitoring blood pressure 1 x per month.  Knowledge Deficits related to basic Diabetes / Hypertension pathophysiology and self care/management Case Manager Clinical Goal(s):  patient will demonstrate improved adherence to prescribed treatment plan for diabetes self care/management as evidenced by: daily monitoring and recording of CBG  adherence to ADA/ carb modified diet adherence to prescribed medication regimen contacting provider for new or worsened symptoms or questions patient will verbalize understanding of plan for hypertension management patient will attend all scheduled medical appointments: patient will demonstrate  improved adherence to prescribed treatment plan for hypertension as evidenced by taking all medications as prescribed, monitoring and recording blood pressure as directed, adhering to low sodium/DASH diet Interventions:  Collaboration with [name], MD regarding development and update of comprehensive plan of care as evidenced by provider attestation and co-signature Inter-disciplinary care team collaboration (see longitudinal plan of care) Provided written and verbal education to patient about basic DM  and HTN disease process Reviewed medications with patient and discussed  importance of medication adherence Discussed plans with patient for ongoing care management follow up and provided patient with direct contact information for care management team Provided patient with written educational materials related to hypo and hyperglycemia and importance of correct treatment Reviewed scheduled/upcoming provider appointments: Follow up appointment with endocrinologist 02/15/21, primary care provider annual wellness visit 03/08/2021, Ophthalmology follow up 05/11/2021, Podiatry appointment 05/16/2021 Provided education to patient re: stroke prevention, s/s of heart attack and stroke, DASH diet, complications of uncontrolled blood pressure Advised patient, providing education and rationale, to monitor blood pressure daily and record, calling PCP for findings outside established parameters.  Self-Care Activities  Self administers oral and insulin medications as prescribed Attends all scheduled provider appointments Checks blood sugars as prescribed and utilize hyper and hypoglycemia protocol as needed Adheres to prescribed ADA/carb modified Attends all scheduled provider appointments Calls provider office for new concerns, questions, or BP outside discussed parameters Checks BP and records as discussed Follows a low sodium diet/DASH diet Patient Goals: - check blood sugar at prescribed times - check blood sugar if I feel it is too high or too low - take the blood sugar meter to all doctor visits RULE OF 15 How to treat low blood sugars (Blood sugar less than 70 mg/dl  Please follow the RULE OF 15 for the treatment of hypoglycemia treatment (When your blood sugars are less than 70 mg/ dl) STEP  1:  Take 15 grams of carbohydrates when your blood sugar is low, which includes:   3-4 glucose tabs or  3-4 oz of juice or regular soda or  One tube of glucose gel STEP 2:  Recheck blood sugar in 15 minutes STEP 3:  If your blood sugar is still low at the 15 minute recheck ---then,  go back to STEP 1 and treat again with another 15 grams of carbohydrates -Check blood pressure at least 1-2 times per week - Take your medications as prescribed - Report new or ongoing symptoms to your provider as soon as possible.  - Review educational information on Diabetes / High blood pressure sent to you in the mail by your RN case manager. Follow Up Plan: The patient has been provided with contact information for the care management team and has been advised to call with any health related questions or concerns.  The care management team will reach out to the patient again over the next 30 days.         Plan:The patient has been provided with contact information for the care management team and has been advised to call with any health related questions or concerns.  and A HIPAA compliant phone message was left for the patient providing contact information and requesting a return call. Quinn Plowman RN,BSN,CCM RN Case Manager [place]  [phone]

## 2021-02-16 ENCOUNTER — Ambulatory Visit: Payer: Medicare HMO | Admitting: *Deleted

## 2021-02-16 DIAGNOSIS — Z794 Long term (current) use of insulin: Secondary | ICD-10-CM | POA: Diagnosis not present

## 2021-02-16 DIAGNOSIS — E785 Hyperlipidemia, unspecified: Secondary | ICD-10-CM | POA: Diagnosis not present

## 2021-02-16 DIAGNOSIS — E1169 Type 2 diabetes mellitus with other specified complication: Secondary | ICD-10-CM

## 2021-02-16 DIAGNOSIS — E113299 Type 2 diabetes mellitus with mild nonproliferative diabetic retinopathy without macular edema, unspecified eye: Secondary | ICD-10-CM

## 2021-02-16 DIAGNOSIS — I10 Essential (primary) hypertension: Secondary | ICD-10-CM

## 2021-02-16 DIAGNOSIS — R413 Other amnesia: Secondary | ICD-10-CM

## 2021-02-16 NOTE — Patient Instructions (Signed)
Visit Information  PATIENT GOALS:  Goals Addressed             This Visit's Progress    Maintain My Health and Quality of LIfe       Timeframe:  Short-Term Goal Priority:  High Start Date:      02/16/21                       Expected End Date:   03/18/21                     Follow Up Date 03/02/2021    - discuss my treatment options with the doctor or nurse - do one enjoyable thing every day - do something different, like talking to a new person or going to a new place, every day - learn something new by asking, reading and searching the Internet every day - make shared treatment decisions with doctor - share memories using a picture album or scrapbook with my loved ones - spend time with a child every day, borrow one if I have to - spend time outdoors at least 3 times a week    Why is this important?   Having a long-term illness can be scary.  It can also be stressful for you and your caregiver.  These steps may help.    Notes:          The patient verbalized understanding of instructions, educational materials, and care plan provided today and declined offer to receive copy of patient instructions, educational materials, and care plan.   Telephone follow up appointment with care management team member scheduled for:03/02/2021  Eduard Clos MSW, [place] Licensed Clinical Social Worker [place] [phone]

## 2021-02-16 NOTE — Patient Instructions (Signed)
Visit Information:  Thank you for taking the time to speak with me.   PATIENT GOALS:   Goals Addressed             This Visit's Progress    Monitor and Manage My Blood Sugar-Diabetes Type 2 and Blood pressure - Hypertension   On track    Timeframe:  Long-Range Goal Priority:  High Start Date:   02/15/2021                          Expected End Date:  04/27/2021                    Follow Up Date 03/21/2021    - check blood sugar at prescribed times - check blood sugar if I feel it is too high or too low - take the blood sugar meter to all doctor visits RULE OF 15 How to treat low blood sugars (Blood sugar less than 70 mg/dl  Please follow the RULE OF 15 for the treatment of hypoglycemia treatment (When your blood sugars are less than 70 mg/ dl) STEP  1:  Take 15 grams of carbohydrates when your blood sugar is low, which includes:   3-4 glucose tabs or  3-4 oz of juice or regular soda or  One tube of glucose gel STEP 2:  Recheck blood sugar in 15 minutes STEP 3:  If your blood sugar is still low at the 15 minute recheck ---then, go back to STEP 1 and treat again with another 15 grams of carbohydrates -Check blood pressure at least 1-2 times per week - Take your medications as prescribed - Report new or ongoing symptoms to your provider as soon as possible.  - Review educational information on Diabetes / High blood pressure sent to you in the mail by your RN case manager.   Why is this important?   Checking your blood sugar at home helps to keep it from getting very high or very low.  Writing the results in a diary or log helps the doctor know how to care for you.  Your blood sugar log should have the time, date and the results.  Also, write down the amount of insulin or other medicine that you take.  Other information, like what you ate, exercise done and how you were feeling, will also be helpful.            Consent to CCM Services: Mr. Southwood was given information about  Chronic Care Management services today including:  CCM service includes personalized support from designated clinical staff supervised by his physician, including individualized plan of care and coordination with other care providers 24/7 contact phone numbers for assistance for urgent and routine care needs. Service will only be billed when office clinical staff spend 20 minutes or more in a month to coordinate care. Only one practitioner may furnish and bill the service in a calendar month. The patient may stop CCM services at any time (effective at the end of the month) by phone call to the office staff. The patient will be responsible for cost sharing (co-pay) of up to 20% of the service fee (after annual deductible is met).  Patient agreed to services and verbal consent obtained.   The patient verbalized understanding of instructions, educational materials, and care plan provided today and agreed to receive a mailed copy of patient instructions, educational materials, and care plan.   The patient has been provided  with contact information for the care management team and has been advised to call with any health related questions or concerns.  The care management team will reach out to the patient again over the next 30 days.   Quinn Plowman RN,BSN,CCM RN Case Manager [facility]  [phone]   CLINICAL CARE PLAN: Patient Care Plan: Diabetes Type 2 (Adult)   Problem Identified: Diabetes with hypertension management   Priority: High   Long-Range Goal: Glycemic/ blood pressure management   Start Date: 02/15/2021  This Visit's Progress: On track  Priority: High  Objective:  Lab Results  Component Value Date   HGBA1C 8.0 (A) 10/27/2020   Lab Results  Component Value Date   CREATININE 1.02 03/02/2020   CREATININE 0.91 01/12/2020   CREATININE 0.92 01/13/2019  Last practice recorded BP readings:  BP Readings from Last 3 Encounters:  12/16/20 128/78  12/15/20 114/83   12/02/20 114/70  Current Barriers: 75 year old with history of DMII and HTN. Patient states he has frequent lows blood sugars that have required EMS assistance. Patient reports one of his blood sugars was as low as 13.  Patient reports his blood sugars range from <70 to 200's.  Patient reports he sees an endocrinologist who is working with him to manage his diabetes. He states he uses a DEXCOM for blood sugar tracking. Patient reports his most recent Hgb A1c is 8.0.  He reports his next follow up with his endocrinologist is today to discuss plan for managing the ongoing low blood sugars. Patient states he is not currently taking his Ozempic because he was in the donut whole. He states he will start taking the Ozempic next month.   Patient states his primary care provider manages his high blood pressure. He states he takes his medications as prescribed. Patient reports monitoring blood pressure 1 x per month.  Knowledge Deficits related to basic Diabetes / Hypertension pathophysiology and self care/management Case Manager Clinical Goal(s):  patient will demonstrate improved adherence to prescribed treatment plan for diabetes self care/management as evidenced by: daily monitoring and recording of CBG  adherence to ADA/ carb modified diet adherence to prescribed medication regimen contacting provider for new or worsened symptoms or questions patient will verbalize understanding of plan for hypertension management patient will attend all scheduled medical appointments: patient will demonstrate improved adherence to prescribed treatment plan for hypertension as evidenced by taking all medications as prescribed, monitoring and recording blood pressure as directed, adhering to low sodium/DASH diet Interventions:  Collaboration with [name], MD regarding development and update of comprehensive plan of care as evidenced by provider attestation and co-signature Inter-disciplinary care team collaboration  (see longitudinal plan of care) Provided written and verbal education to patient about basic DM  and HTN disease process Reviewed medications with patient and discussed importance of medication adherence Discussed plans with patient for ongoing care management follow up and provided patient with direct contact information for care management team Provided patient with written educational materials related to hypo and hyperglycemia and importance of correct treatment Reviewed scheduled/upcoming provider appointments: Follow up appointment with endocrinologist 02/15/21, primary care provider annual wellness visit 03/08/2021, Ophthalmology follow up 05/11/2021, Podiatry appointment 05/16/2021 Provided education to patient re: stroke prevention, s/s of heart attack and stroke, DASH diet, complications of uncontrolled blood pressure Advised patient, providing education and rationale, to monitor blood pressure daily and record, calling PCP for findings outside established parameters.  Self-Care Activities  Self administers oral and insulin medications as prescribed Attends all  scheduled provider appointments Checks blood sugars as prescribed and utilize hyper and hypoglycemia protocol as needed Adheres to prescribed ADA/carb modified Attends all scheduled provider appointments Calls provider office for new concerns, questions, or BP outside discussed parameters Checks BP and records as discussed Follows a low sodium diet/DASH diet Patient Goals: - check blood sugar at prescribed times - check blood sugar if I feel it is too high or too low - take the blood sugar meter to all doctor visits RULE OF 15 How to treat low blood sugars (Blood sugar less than 70 mg/dl  Please follow the RULE OF 15 for the treatment of hypoglycemia treatment (When your blood sugars are less than 70 mg/ dl) STEP  1:  Take 15 grams of carbohydrates when your blood sugar is low, which includes:   3-4 glucose tabs or  3-4 oz of  juice or regular soda or  One tube of glucose gel STEP 2:  Recheck blood sugar in 15 minutes STEP 3:  If your blood sugar is still low at the 15 minute recheck ---then, go back to STEP 1 and treat again with another 15 grams of carbohydrates -Check blood pressure at least 1-2 times per week - Take your medications as prescribed - Report new or ongoing symptoms to your provider as soon as possible.  - Review educational information on Diabetes / High blood pressure sent to you in the mail by your RN case manager. Follow Up Plan: The patient has been provided with contact information for the care management team and has been advised to call with any health related questions or concerns.  The care management team will reach out to the patient again over the next 30 days.

## 2021-02-16 NOTE — Chronic Care Management (AMB) (Signed)
Chronic Care Management    Clinical Social Work Note  02/16/2021 Name: Anthony [name] MRN: [id number] DOB: 1946-07-03  Anthony [name] is a 75 y.o. year old male who is a primary care patient of [name], MD. The CCM team was consulted to assist the patient with chronic disease management and/or care coordination needs related to:  Pharmacy consult for "memory impairment and need for more assistance" .   Engaged with patient by telephone for initial visit in response to provider referral for social work chronic care management and care coordination services.   Consent to Services:  The patient was given information about Chronic Care Management services, agreed to services, and gave verbal consent prior to initiation of services.  Please see initial visit note for detailed documentation.   Patient agreed to services and consent obtained.   Assessment: Review of patient past medical history, allergies, medications, and health status, including review of relevant consultants reports was performed today as part of a comprehensive evaluation and provision of chronic care management and care coordination services.     SDOH (Social Determinants of Health) assessments and interventions performed:    Advanced Directives Status: See Care Plan for related entries.  CCM Care Plan  No Known Allergies  Outpatient Encounter Medications as of 02/16/2021  Medication Sig Note   amLODipine (NORVASC) 5 MG tablet Take 1 tablet (5 mg total) by mouth daily.    b complex vitamins tablet Take 1 tablet by mouth daily.    Continuous Blood Gluc Transmit (DEXCOM G6 TRANSMITTER) MISC 1 Device by Does not apply route every 3 (three) months.    Dextrose, Diabetic Use, (GLUCOSE PO) Take 1 tablet by mouth as needed.    glucagon (GLUCAGON EMERGENCY) 1 MG injection Inject 1 mg into the muscle once as needed for up to 1 dose.    glucose blood (ONETOUCH ULTRA) test strip USE AS DIRECTED FOUR TIMES DAILY Dx  code E11.65    insulin aspart (NOVOLOG FLEXPEN) 100 UNIT/ML FlexPen Inject 10-18 Units into the skin 3 (three) times daily with meals. 12/15/2020: 14 units this am    Insulin Pen Needle (ADVOCATE INSULIN PEN NEEDLES) 31G X 5 MM MISC Use 3x a day    metFORMIN (GLUCOPHAGE-XR) 500 MG 24 hr tablet TAKE 2 TABLETS (1,000 MG TOTAL) BY MOUTH DAILY. TAKE 2 TABLETS BY MOUTH TWICE DAILY WITH MEALS (Patient taking differently: 500 mg daily with breakfast.)    olmesartan-hydrochlorothiazide (BENICAR HCT) 40-25 MG tablet Take 1 tablet by mouth daily.    pantoprazole (PROTONIX) 40 MG tablet Take 1 tablet (40 mg total) by mouth daily.    Semaglutide, 1 MG/DOSE, (OZEMPIC, 1 MG/DOSE,) 4 MG/3ML SOPN Inject 1 mg into the skin once a week. (Patient not taking: No sig reported)    simvastatin (ZOCOR) 20 MG tablet TAKE 1 TABLET BY MOUTH EVERYDAY AT BEDTIME    tadalafil (CIALIS) 20 MG tablet Take 0.5-1 tablets (10-20 mg total) by mouth every other day as needed for erectile dysfunction.    TRESIBA FLEXTOUCH 200 UNIT/ML FlexTouch Pen INJECT 32 UNITS into THE SKIN DAILY (Patient taking differently: Inject 30 Units into the skin.)    No facility-administered encounter medications on file as of 02/16/2021.    Patient Active Problem List   Diagnosis Date Noted   Memory impairment 02/10/2021   Non-adherence to medical treatment 10/27/2020   COPD (chronic obstructive pulmonary disease) ([place]) 02/28/2020   Coronary artery calcification seen on CAT scan 02/28/2020   Aortic atherosclerosis ([place])  02/28/2020   Ex-smoker 01/21/2020   RLQ abdominal pain 01/21/2020   Numbness and tingling in left arm 01/21/2020   Health maintenance examination 01/05/2017   Right knee pain 01/18/2016   Barrett's esophagus 12/08/2015   Abnormal tympanic membrane of right ear 07/05/2015   Advanced care planning/counseling discussion 06/30/2014   Medicare annual wellness visit, subsequent 06/22/2013   Benign prostatic hyperplasia with urinary  obstruction 06/18/2013   ED (erectile dysfunction) 04/10/2012   Hyperlipidemia associated with type 2 diabetes mellitus ([place]) 12/29/2008   LEG PAIN, BILATERAL 11/19/2008   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 ([place]) 07/24/2007   ANXIETY 07/24/2007   GERD 07/24/2007    Conditions to be addressed/monitored: DMII and concern for memory impairment ; Mental Health Concerns  and Memory Deficits  Care Plan : LCSW Plan of Care  Updates made by Deirdre Peer, LCSW since 02/16/2021 12:00 AM     Problem: Lack of knowledge/awareness of healthcare needs related to DM and other comorbidities   Priority: High     Goal: To improve pt's overall self management of healthcare needs and maintain Quality of Life   Start Date: 02/16/2021  Expected End Date: 03/18/2021  This Visit's Progress: On track  Priority: High  Note:   Current barriers:   Poor  Currently unable to  independently self manage needs related to chronic health conditions.  Knowledge Deficits related to short term plan for care coordination needs and long term plans for chronic disease management needs Clinical Goals: patient will work with SW to address concerns related to assessment of needs Interventions:  Assessment of needs, barriers , as well as how impacting  Collaboration with PCP regarding development and update of comprehensive plan of care as evidenced by provider attestation and co-signature Inter-disciplinary care team collaboration (see longitudinal plan of care) Patient interviewed and appropriate assessments performed Provided patient with information about Medicaid (not eligible due to monthly income/assets) Discussed plans with patient for ongoing care management follow up and provided patient with direct contact information for care management team Advised patient to contact CSW if needs arise Collaborated with RN Case Manager re: pt/needs Collaborated with primary care  provider re: assessment Collaborated with Embedded Pharmacist re: pt/needs Other interventions provided: Depression screen reviewed , Solution-Focused Strategies, Active listening / Reflection utilized , Emotional Supportive Provided, Problem Solving [place] , Psychoeducation for mental health needs , Motivational Interviewing, Brief CBT , Suicidal Ideation/Homicidal Ideation assessed:, and PHQ2/ PHQ9 completed Patient self-care activities: Over the next 30 days   discuss my treatment options with the doctor or nurse do one enjoyable thing every day do something different, like talking to a new person or going to a new place, every day learn something new by asking, reading and searching the Internet every day make shared treatment decisions with doctor share memories using a picture album or scrapbook with my loved ones spend time with a child every day, borrow one if I have to spend time outdoors at least 3 times a week  Continue with compliance of taking medication  Contact CSW or other team members of needs or concerns; ie: paying for medications, mental health, etc  Follow Up Plan:  SW will follow up with patient by phone over the next 30 days        Follow Up Plan: Appointment scheduled for SW follow up with client by phone on: 03/02/2021      Eduard Clos MSW, LCSW Licensed Clinical Social Worker [place]  [phone]    

## 2021-02-16 NOTE — Progress Notes (Addendum)
Patient reports that he has had several visits from EMS for low blood sugars. He is currently wearing a LIbre sensor with alerts for lows set at 57, and son is sharing his readings.  His wife has altheimers and he is taking care of her--feeding her and helping her to dress.   Insulin dose: Tresiba 24u Q HS.  Says he thinks he takes this every evening, but not sure. "I may forget 1-2 times /wk.  Says he remembers to take his Novolog before meals and this in on a sliding scale, between 12 and 18u.  Can not remember the scale, but has it written down on his refrigerator. His meals are balanced with sufficient protein and carbs.  Mose meals are takeout or delivery, except for breakfast, whidh is usually a grilled cheese sandwich and spam.  Coffee to drink. His activity each day is variable.  He has a garage that he goes to every morning after breakfast, and works on "things", mostly machinery like cars and truck.  He also does some yard activity, but that is rare.  Son mostly does the lawn care for him Elenor Legato was downloaded and showing many highs for long periods of time. He reports taking extra insulin sometimes as much as 12u when high.  Ususally thisdwill not drop his blood sugar into the low range, but it may have the day the ambulance was called.  He is not sure.   Problems identified: Forgetting his [name] Over treating lows- treating with 1-2 bottles of 6-8 ounces of apple juice plus a banana. Keeps treating lows until he sees a rise in his sensor.  Most meals are high in fat and sodium Addition of varying amounts of Novolog when high, sometimes bring his blood sugar down and sometimes not--possibly due to not taking [name] Plan:  Discussed appropriate treatment of lows and need to tell son that he must wait 30 minutes after treatment to see blood sugar rise on sensor readings. Set low alarm to 80. Have juice available in the garage for him to treat sooner He was shown the V-Go device and we  discussed the advantages of this with no need for long acting insulin.  Says he will discuss this with Dr. Renne Crigler on Thursday when he sees her.

## 2021-02-16 NOTE — Patient Instructions (Signed)
Treat low blood sugar with one banana, or one bottle of juice Test blood sugar with a finger stick in 15 min.  Do not watch the sensor readings Keep bottle of juice in the garage left out on the work bench to see Consider V-go trial of 6 days

## 2021-02-17 ENCOUNTER — Encounter: Payer: Self-pay | Admitting: Internal Medicine

## 2021-02-17 ENCOUNTER — Ambulatory Visit (INDEPENDENT_AMBULATORY_CARE_PROVIDER_SITE_OTHER): Payer: Medicare HMO | Admitting: Internal Medicine

## 2021-02-17 ENCOUNTER — Other Ambulatory Visit: Payer: Self-pay

## 2021-02-17 VITALS — BP 148/80 | HR 82 | Ht 72.0 in | Wt 212.0 lb

## 2021-02-17 DIAGNOSIS — E113299 Type 2 diabetes mellitus with mild nonproliferative diabetic retinopathy without macular edema, unspecified eye: Secondary | ICD-10-CM

## 2021-02-17 DIAGNOSIS — Z7984 Long term (current) use of oral hypoglycemic drugs: Secondary | ICD-10-CM | POA: Diagnosis not present

## 2021-02-17 DIAGNOSIS — Z794 Long term (current) use of insulin: Secondary | ICD-10-CM | POA: Diagnosis not present

## 2021-02-17 DIAGNOSIS — E663 Overweight: Secondary | ICD-10-CM | POA: Diagnosis not present

## 2021-02-17 DIAGNOSIS — M1990 Unspecified osteoarthritis, unspecified site: Secondary | ICD-10-CM | POA: Diagnosis not present

## 2021-02-17 DIAGNOSIS — E1165 Type 2 diabetes mellitus with hyperglycemia: Secondary | ICD-10-CM | POA: Diagnosis not present

## 2021-02-17 DIAGNOSIS — N529 Male erectile dysfunction, unspecified: Secondary | ICD-10-CM | POA: Diagnosis not present

## 2021-02-17 DIAGNOSIS — E1143 Type 2 diabetes mellitus with diabetic autonomic (poly)neuropathy: Secondary | ICD-10-CM | POA: Diagnosis not present

## 2021-02-17 DIAGNOSIS — I10 Essential (primary) hypertension: Secondary | ICD-10-CM | POA: Diagnosis not present

## 2021-02-17 DIAGNOSIS — K219 Gastro-esophageal reflux disease without esophagitis: Secondary | ICD-10-CM | POA: Diagnosis not present

## 2021-02-17 DIAGNOSIS — E785 Hyperlipidemia, unspecified: Secondary | ICD-10-CM | POA: Diagnosis not present

## 2021-02-17 DIAGNOSIS — E1140 Type 2 diabetes mellitus with diabetic neuropathy, unspecified: Secondary | ICD-10-CM | POA: Diagnosis not present

## 2021-02-17 LAB — POCT GLYCOSYLATED HEMOGLOBIN (HGB A1C): Hemoglobin A1C: 6.9 % — AB (ref 4.0–5.6)

## 2021-02-17 MED ORDER — TRESIBA FLEXTOUCH 200 UNIT/ML ~~LOC~~ SOPN: PEN_INJECTOR | SUBCUTANEOUS | 3 refills | Status: DC

## 2021-02-17 MED ORDER — BAQSIMI ONE PACK 3 MG/DOSE NA POWD: NASAL | 99 refills | Status: DC
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
## 2021-02-17 NOTE — Patient Instructions (Addendum)
For now, please continue: - Metformin ER 500 mg daily - Novolog 15 minutes before each meal: - 12 units before small meals - 15 units before regular meals - 18-20 units before larger meals   Restart: - Ozempic 1 mg weekly  Please increase: - Tresiba 28 units daily   Set an alarm daily to take [name].  Do not take insulin for correction at night.  Please return in 2 months.

## 2021-02-22 ENCOUNTER — Telehealth: Payer: Self-pay | Admitting: Pharmacy Technician

## 2021-02-22 NOTE — Telephone Encounter (Addendum)
Patient Advocate Encounter   Received notification from [facility] that prior authorization for BAQSIMI ONE PACK is required.   PA submitted on 02/22/2021 Key [id number] Status is DENIED.  Messaged provider, insurance requires trying GVOKE first, or an appeal can be requested.    [facility] will continue to follow.   Venida Jarvis. Nadara Mustard, CPhT Patient Advocate [facility] Phone: [phone] Fax:  [phone]

## 2021-02-23 ENCOUNTER — Other Ambulatory Visit: Payer: Self-pay | Admitting: Internal Medicine

## 2021-02-23 MED ORDER — GVOKE HYPOPEN 1-PACK 1 MG/0.2ML ~~LOC~~ SOAJ: SUBCUTANEOUS | 11 refills | Status: DC

## 2021-02-24 ENCOUNTER — Other Ambulatory Visit: Payer: Self-pay

## 2021-02-24 ENCOUNTER — Telehealth: Payer: Self-pay | Admitting: Internal Medicine

## 2021-02-24 MED ORDER — METFORMIN HCL ER 500 MG PO TB24: 500.0000 mg | ORAL_TABLET | Freq: Every day | ORAL | 0 refills | Status: DC

## 2021-02-24 NOTE — Telephone Encounter (Signed)
Anthony [name] with patients insurance called on patient's behalf requesting refill for:  Simvastatin olmesartan-hydrochlorothiazide  metFORMIN - for the metformin, patient said he went from taking 4 tablets a day to taking 1 tablet a day and asked if we could call in Rx to reflect this.  PHARMACY -   [id number] - [place], [place] Phone:  [phone]  Fax:  [phone]

## 2021-02-26 ENCOUNTER — Other Ambulatory Visit: Payer: Self-pay | Admitting: Family Medicine

## 2021-02-26 DIAGNOSIS — R413 Other amnesia: Secondary | ICD-10-CM

## 2021-02-26 DIAGNOSIS — E1169 Type 2 diabetes mellitus with other specified complication: Secondary | ICD-10-CM

## 2021-02-26 DIAGNOSIS — Z794 Long term (current) use of insulin: Secondary | ICD-10-CM

## 2021-02-26 DIAGNOSIS — N401 Enlarged prostate with lower urinary tract symptoms: Secondary | ICD-10-CM

## 2021-02-26 DIAGNOSIS — E113299 Type 2 diabetes mellitus with mild nonproliferative diabetic retinopathy without macular edema, unspecified eye: Secondary | ICD-10-CM

## 2021-03-01 ENCOUNTER — Other Ambulatory Visit: Payer: Medicare HMO

## 2021-03-01 NOTE — Progress Notes (Signed)
Patient in office today and seen by EJ with [facility]. Patient tried on diabetic shoes and was satisfied with the fit. Patient was educated on the break-in process at this time. Patient advised to call the office with any questions, comments, or concerns. Patient verbalized understanding.  

## 2021-03-02 ENCOUNTER — Ambulatory Visit (INDEPENDENT_AMBULATORY_CARE_PROVIDER_SITE_OTHER): Payer: Medicare HMO | Admitting: *Deleted

## 2021-03-02 DIAGNOSIS — R413 Other amnesia: Secondary | ICD-10-CM

## 2021-03-02 DIAGNOSIS — E083291 Diabetes mellitus due to underlying condition with mild nonproliferative diabetic retinopathy without macular edema, right eye: Secondary | ICD-10-CM

## 2021-03-02 NOTE — Patient Instructions (Signed)
   PATIENT GOALS:  Goals Addressed             This Visit's Progress    COMPLETED: Maintain My Health and Quality of LIfe       Timeframe:  Short-Term Goal Priority:  High Start Date:      02/16/21                       Expected End Date:   03/18/21                     Follow Up Date 03/02/2021    - discuss my treatment options with the doctor or nurse - do one enjoyable thing every day - do something different, like talking to a new person or going to a new place, every day - learn something new by asking, reading and searching the Internet every day - make shared treatment decisions with doctor - share memories using a picture album or scrapbook with my loved ones - spend time with a child every day, borrow one if I have to - spend time outdoors at least 3 times a week    Why is this important?   Having a long-term illness can be scary.  It can also be stressful for you and your caregiver.  These steps may help.    Notes:          The patient verbalized understanding of instructions, educational materials, and care plan provided today and declined offer to receive copy of patient instructions, educational materials, and care plan.   No further follow up required:    Eduard Clos MSW, LCSW Licensed Clinical Social Worker [place] [phone]

## 2021-03-02 NOTE — Chronic Care Management (AMB) (Signed)
Chronic Care Management    Clinical Social Work Note  03/02/2021 Name: Anthony [name] MRN: [id number] DOB: 1945-09-11  Anthony [name] is a 75 y.o. year old male who is a primary care patient of [name], MD. The CCM team was consulted to assist the patient with chronic disease management and/or care coordination needs related to:  referral due to memory issues .   Engaged with patient by telephone for follow up visit in response to provider referral for social work chronic care management and care coordination services.   Consent to Services:  The patient was given information about Chronic Care Management services, agreed to services, and gave verbal consent prior to initiation of services.  Please see initial visit note for detailed documentation.   Patient agreed to services and consent obtained.   Assessment: Review of patient past medical history, allergies, medications, and health status, including review of relevant consultants reports was performed today as part of a comprehensive evaluation and provision of chronic care management and care coordination services.     SDOH (Social Determinants of Health) assessments and interventions performed:    Advanced Directives Status: Not addressed in this encounter.  CCM Care Plan  No Known Allergies  Outpatient Encounter Medications as of 03/02/2021  Medication Sig   amLODipine (NORVASC) 5 MG tablet Take 1 tablet (5 mg total) by mouth daily.   b complex vitamins tablet Take 1 tablet by mouth daily.   Continuous Blood Gluc Transmit (DEXCOM G6 TRANSMITTER) MISC 1 Device by Does not apply route every 3 (three) months.   Dextrose, Diabetic Use, (GLUCOSE PO) Take 1 tablet by mouth as needed.   glucose blood (ONETOUCH ULTRA) test strip USE AS DIRECTED FOUR TIMES DAILY Dx code E11.65   GVOKE HYPOPEN 1-PACK 1 MG/0.2ML SOAJ Inject under skin 0.2 mL as needed for hypoglycemia   insulin degludec (TRESIBA FLEXTOUCH) 200 UNIT/ML  FlexTouch Pen Inject under skin 28 units daily   Insulin Pen Needle (ADVOCATE INSULIN PEN NEEDLES) 31G X 5 MM MISC Use 3x a day   metFORMIN (GLUCOPHAGE-XR) 500 MG 24 hr tablet Take 1 tablet (500 mg total) by mouth daily with breakfast.   NOVOLOG FLEXPEN 100 UNIT/ML FlexPen INJECT 10-18 UNITS into THE SKIN THREE TIMES DAILY with meals   olmesartan-hydrochlorothiazide (BENICAR HCT) 40-25 MG tablet Take 1 tablet by mouth daily.   pantoprazole (PROTONIX) 40 MG tablet Take 1 tablet (40 mg total) by mouth daily.   Semaglutide, 1 MG/DOSE, (OZEMPIC, 1 MG/DOSE,) 4 MG/3ML SOPN Inject 1 mg into the skin once a week.   simvastatin (ZOCOR) 20 MG tablet TAKE 1 TABLET BY MOUTH EVERYDAY AT BEDTIME   tadalafil (CIALIS) 20 MG tablet Take 0.5-1 tablets (10-20 mg total) by mouth every other day as needed for erectile dysfunction.   No facility-administered encounter medications on file as of 03/02/2021.    Patient Active Problem List   Diagnosis Date Noted   Memory impairment 02/10/2021   Non-adherence to medical treatment 10/27/2020   COPD (chronic obstructive pulmonary disease) ([place]) 02/28/2020   Coronary artery calcification seen on CAT scan 02/28/2020   Aortic atherosclerosis ([place]) 02/28/2020   Ex-smoker 01/21/2020   RLQ abdominal pain 01/21/2020   Numbness and tingling in left arm 01/21/2020   Health maintenance examination 01/05/2017   Right knee pain 01/18/2016   Barrett's esophagus 12/08/2015   Abnormal tympanic membrane of right ear 07/05/2015   Advanced care planning/counseling discussion 06/30/2014   Medicare annual wellness visit, subsequent 06/22/2013  Benign prostatic hyperplasia with urinary obstruction 06/18/2013   ED (erectile dysfunction) 04/10/2012   Hyperlipidemia associated with type 2 diabetes mellitus ([place]) 12/29/2008   LEG PAIN, BILATERAL 11/19/2008   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 ([place]) 07/24/2007   ANXIETY 07/24/2007   GERD  07/24/2007    Conditions to be addressed/monitored: DMII;  N/A  Care Plan : LCSW Plan of Care  Updates made by Deirdre Peer, LCSW since 03/02/2021 12:00 AM     Problem: Lack of knowledge/awareness of healthcare needs related to DM and other comorbidities   Priority: High     Goal: To improve pt's overall self management of healthcare needs and maintain Quality of Life Completed 03/02/2021  Start Date: 02/16/2021  Expected End Date: 03/18/2021  This Visit's Progress: On track  Recent Progress: On track  Priority: High  Note:   Current barriers:   Knowledge Deficits related to short term plan for care coordination needs and long term plans for chronic disease management needs Clinical Goals: patient will work with SW to address concerns related to assessment of needs Interventions:  CSW made contact with pt today. Pt alert, oriented and engaged with CSW. Pt denies any mental health diagnoses or concerns. Pt allowed CSW to also speak with his wife to assess needs- per wife, they have no concerns or needs for social work. CSW explained to wife the reason for CSW consult was related to concerns about his DM and management of the blood sugar teseting, RX, etc. She reports the issues were "the worst ever" and has corrected; after some med changes.  Both deny any issues at this time and CSW will sign off.  Depression screen [facility] 2/9 02/16/2021 02/15/2021 01/12/2020 01/09/2019 01/01/2018  Decreased Interest 0 0 0 0 0  Down, Depressed, Hopeless 0 0 0 0 0  PHQ - 2 Score 0 0 0 0 0  Altered sleeping - - 0 0 0  Tired, decreased energy - - 0 0 0  Change in appetite - - 0 0 0  Feeling bad or failure about yourself  - - 0 0 0  Trouble concentrating - - 0 0 0  Moving slowly or fidgety/restless - - 0 0 0  Suicidal thoughts - - 0 0 0  PHQ-9 Score - - 0 0 0  Difficult doing work/chores - - Not difficult at all Not difficult at all Not difficult at all  Some recent data might be hidden   Assessment of  needs, barriers , as well as how impacting  Collaboration with PCP regarding development and update of comprehensive plan of care as evidenced by provider attestation and co-signature Inter-disciplinary care team collaboration (see longitudinal plan of care) Patient interviewed and appropriate assessments performed Provided patient with information about Medicaid (not eligible due to monthly income/assets) Discussed plans with patient for ongoing care management follow up and provided patient with direct contact information for care management team Advised patient to contact CSW if needs arise Collaborated with RN Case Manager re: pt/needs Collaborated with primary care provider re: assessment Collaborated with Embedded Pharmacist re: pt/needs Other interventions provided: Depression screen reviewed , Solution-Focused Strategies, Active listening / Reflection utilized , Emotional Supportive Provided, Problem Solving /Task Center , Psychoeducation for mental health needs , Motivational Interviewing, Brief CBT , Suicidal Ideation/Homicidal Ideation assessed:, and PHQ2/ PHQ9 completed Patient self-care activities:   discuss my treatment options with the doctor or nurse do one enjoyable thing every day do something different, like talking  to a new person or going to a new place, every day learn something new by asking, reading and searching the Internet every day make shared treatment decisions with doctor share memories using a picture album or scrapbook with my loved ones spend time with a child every day, borrow one if I have to spend time outdoors at least 3 times a week  Continue with compliance of taking medication  Contact CSW or other team members of needs or concerns; ie: paying for medications, mental health, etc  Follow Up Plan:  Client will reach out to CSW or request through PCP office if needs arise.    CSW to sign off. Eduard Clos MSW, LCSW Licensed Clinical Social  Worker [place] [phone]       Follow Up Plan: Client will reach out to CSW if needs arise or ask PCP to [place] MSW, [place] Licensed Clinical Social Worker [place] [phone]

## 2021-03-04 ENCOUNTER — Telehealth: Payer: Self-pay

## 2021-03-04 ENCOUNTER — Other Ambulatory Visit: Payer: Self-pay | Admitting: Acute Care

## 2021-03-04 DIAGNOSIS — Z122 Encounter for screening for malignant neoplasm of respiratory organs: Secondary | ICD-10-CM

## 2021-03-04 NOTE — Progress Notes (Addendum)
    Chronic Care Management Pharmacy Assistant   Name: Anthony [name]  MRN: [id number] DOB: [birth year]  Reason for Encounter: Refill and delivery request  Recent office visits:  None since last ccm contact  Recent consult visits:  02/22/21- Endocrinology - Telephone encounter - Baqsimi denied from insurance. Patient to try Gvoke first.  02/17/21- Endocrinology - Started patient on Baqsimi. Restart Ozempic 1 mg weekly. Increase Tresiba 28 units daily. Set an alarm daily to take [name]. Do not take insulin for correction at night. 02/15/21- Nutritionist for Diabetes   Hospital visits:  None in previous 6 months  Medications: Outpatient Encounter Medications as of 03/04/2021  Medication Sig   amLODipine (NORVASC) 5 MG tablet Take 1 tablet (5 mg total) by mouth daily.   b complex vitamins tablet Take 1 tablet by mouth daily.   Continuous Blood Gluc Transmit (DEXCOM G6 TRANSMITTER) MISC 1 Device by Does not apply route every 3 (three) months.   Dextrose, Diabetic Use, (GLUCOSE PO) Take 1 tablet by mouth as needed.   glucose blood (ONETOUCH ULTRA) test strip USE AS DIRECTED FOUR TIMES DAILY Dx code E11.65   GVOKE HYPOPEN 1-PACK 1 MG/0.2ML SOAJ Inject under skin 0.2 mL as needed for hypoglycemia   insulin degludec (TRESIBA FLEXTOUCH) 200 UNIT/ML FlexTouch Pen Inject under skin 28 units daily   Insulin Pen Needle (ADVOCATE INSULIN PEN NEEDLES) 31G X 5 MM MISC Use 3x a day   metFORMIN (GLUCOPHAGE-XR) 500 MG 24 hr tablet Take 1 tablet (500 mg total) by mouth daily with breakfast.   NOVOLOG FLEXPEN 100 UNIT/ML FlexPen INJECT 10-18 UNITS into THE SKIN THREE TIMES DAILY with meals   olmesartan-hydrochlorothiazide (BENICAR HCT) 40-25 MG tablet Take 1 tablet by mouth daily.   pantoprazole (PROTONIX) 40 MG tablet Take 1 tablet (40 mg total) by mouth daily.   Semaglutide, 1 MG/DOSE, (OZEMPIC, 1 MG/DOSE,) 4 MG/3ML SOPN Inject 1 mg into the skin once a week.   simvastatin (ZOCOR) 20 MG tablet TAKE 1  TABLET BY MOUTH EVERYDAY AT BEDTIME   tadalafil (CIALIS) 20 MG tablet Take 0.5-1 tablets (10-20 mg total) by mouth every other day as needed for erectile dysfunction.   No facility-administered encounter medications on file as of 03/04/2021.   Patient called in to request refill on Novolog. Asked patient how he was taking the Novolog and he stated he was only using before meals. He also mentioned another low last night but can not recall what it was. Notes blood sugar this morning was 239 fasting and 305 after breakfast. Asked patient if he would like to fill the GVOKE that is on hold at [facility]. Patient would like to try this. Patient aware of $80.00 cost.  Acute form for Novolog and GVOKE has been completed and delivery scheduled for 03/07/21.   Debbora Dus, CPP notified  [name], [place] Pharmacy Assistant [phone]

## 2021-03-08 ENCOUNTER — Ambulatory Visit (INDEPENDENT_AMBULATORY_CARE_PROVIDER_SITE_OTHER): Payer: Medicare HMO | Admitting: Family Medicine

## 2021-03-08 ENCOUNTER — Other Ambulatory Visit: Payer: Self-pay

## 2021-03-08 ENCOUNTER — Encounter: Payer: Self-pay | Admitting: Family Medicine

## 2021-03-08 VITALS — BP 116/72 | HR 84 | Temp 98.6°F | Ht 72.0 in | Wt 205.0 lb

## 2021-03-08 DIAGNOSIS — E1169 Type 2 diabetes mellitus with other specified complication: Secondary | ICD-10-CM | POA: Diagnosis not present

## 2021-03-08 DIAGNOSIS — N401 Enlarged prostate with lower urinary tract symptoms: Secondary | ICD-10-CM

## 2021-03-08 DIAGNOSIS — R413 Other amnesia: Secondary | ICD-10-CM

## 2021-03-08 DIAGNOSIS — Z7189 Other specified counseling: Secondary | ICD-10-CM

## 2021-03-08 DIAGNOSIS — J449 Chronic obstructive pulmonary disease, unspecified: Secondary | ICD-10-CM

## 2021-03-08 DIAGNOSIS — Z0000 Encounter for general adult medical examination without abnormal findings: Secondary | ICD-10-CM

## 2021-03-08 DIAGNOSIS — E785 Hyperlipidemia, unspecified: Secondary | ICD-10-CM

## 2021-03-08 DIAGNOSIS — N138 Other obstructive and reflux uropathy: Secondary | ICD-10-CM

## 2021-03-08 DIAGNOSIS — K2270 Barrett's esophagus without dysplasia: Secondary | ICD-10-CM

## 2021-03-08 DIAGNOSIS — E11649 Type 2 diabetes mellitus with hypoglycemia without coma: Secondary | ICD-10-CM

## 2021-03-08 DIAGNOSIS — I2510 Atherosclerotic heart disease of native coronary artery without angina pectoris: Secondary | ICD-10-CM

## 2021-03-08 DIAGNOSIS — I700 Atherosclerosis of aorta: Secondary | ICD-10-CM

## 2021-03-08 DIAGNOSIS — Z87891 Personal history of nicotine dependence: Secondary | ICD-10-CM

## 2021-03-08 DIAGNOSIS — E083291 Diabetes mellitus due to underlying condition with mild nonproliferative diabetic retinopathy without macular edema, right eye: Secondary | ICD-10-CM

## 2021-03-08 DIAGNOSIS — Z794 Long term (current) use of insulin: Secondary | ICD-10-CM

## 2021-03-08 DIAGNOSIS — K219 Gastro-esophageal reflux disease without esophagitis: Secondary | ICD-10-CM

## 2021-03-08 NOTE — Patient Instructions (Addendum)
Labs today.  Send [place] date of COVID booster to update your chart.  I'm glad sugars are doing some better Bring [place] copy of your living will.  Return in 3-4 months for formal memory assessment.   Health Maintenance After Age 73 After age 22, you are at a higher risk for certain long-term diseases and infections as well as injuries from falls. Falls are a major cause of broken bones and head injuries in people who are older than age 65. Getting regular preventive care can help to keep you healthy and well. Preventive care includes getting regular testing and making lifestyle changes as recommended by your health care provider. Talk with your health care provider about: Which screenings and tests you should have. A screening is a test that checks for a disease when you have no symptoms. A diet and exercise plan that is right for you. What should I know about screenings and tests to prevent falls? Screening and testing are the best ways to find a health problem early. Early diagnosis and treatment give you the best chance of managing medical conditions that are common after age 37. Certain conditions and lifestyle choices may make you more likely to have a fall. Your health care provider may recommend: Regular vision checks. Poor vision and conditions such as cataracts can make you more likely to have a fall. If you wear glasses, make sure to get your prescription updated if your vision changes. Medicine review. Work with your health care provider to regularly review all of the medicines you are taking, including over-the-counter medicines. Ask your health care provider about any side effects that may make you more likely to have a fall. Tell your health care provider if any medicines that you take make you feel dizzy or sleepy. Osteoporosis screening. Osteoporosis is a condition that causes the bones to get weaker. This can make the bones weak and cause them to break more easily. Blood pressure screening.  Blood pressure changes and medicines to control blood pressure can make you feel dizzy. Strength and balance checks. Your health care provider may recommend certain tests to check your strength and balance while standing, walking, or changing positions. Foot health exam. Foot pain and numbness, as well as not wearing proper footwear, can make you more likely to have a fall. Depression screening. You may be more likely to have a fall if you have a fear of falling, feel emotionally low, or feel unable to do activities that you used to do. Alcohol use screening. Using too much alcohol can affect your balance and may make you more likely to have a fall. What actions can I take to lower my risk of falls? General instructions Talk with your health care provider about your risks for falling. Tell your health care provider if: You fall. Be sure to tell your health care provider about all falls, even ones that seem minor. You feel dizzy, sleepy, or off-balance. Take over-the-counter and prescription medicines only as told by your health care provider. These include any supplements. Eat a healthy diet and maintain a healthy weight. A healthy diet includes low-fat dairy products, low-fat (lean) meats, and fiber from whole grains, beans, and lots of fruits and vegetables. Home safety Remove any tripping hazards, such as rugs, cords, and clutter. Install safety equipment such as grab bars in bathrooms and safety rails on stairs. Keep rooms and walkways well-lit. Activity  Follow a regular exercise program to stay fit. This will help you maintain your balance.  Ask your health care provider what types of exercise are appropriate for you. If you need a cane or walker, use it as recommended by your health care provider. Wear supportive shoes that have nonskid soles.  Lifestyle Do not drink alcohol if your health care provider tells you not to drink. If you drink alcohol, limit how much you have: 0-1 drink a  day for women. 0-2 drinks a day for men. Be aware of how much alcohol is in your drink. In the [place] one drink equals one typical bottle of beer (12 oz), one-half glass of wine (5 oz), or one shot of hard liquor (1 oz). Do not use any products that contain nicotine or tobacco, such as cigarettes and e-cigarettes. If you need help quitting, ask your health care provider. Summary Having a healthy lifestyle and getting preventive care can help to protect your health and wellness after age 9. Screening and testing are the best way to find a health problem early and help you avoid having a fall. Early diagnosis and treatment give you the best chance for managing medical conditions that are more common for people who are older than age 91. Falls are a major cause of broken bones and head injuries in people who are older than age 47. Take precautions to prevent a fall at home. Work with your health care provider to learn what changes you can make to improve your health and wellness and to prevent falls. This information is not intended to replace advice given to you by your health care provider. Make sure you discuss any questions you have with your healthcare provider. Document Revised: 07/30/2020 Document Reviewed: 07/30/2020 Elsevier Patient Education  2022 [employer].

## 2021-03-08 NOTE — Assessment & Plan Note (Signed)

## 2021-03-08 NOTE — Assessment & Plan Note (Signed)
Preventative protocols reviewed and updated unless pt declined. Discussed healthy diet and lifestyle.  

## 2021-03-08 NOTE — Progress Notes (Signed)
Patient ID: JD MCCASTER, male    DOB: Jul 06, 1946, 75 y.o.   MRN: [id number]  This visit was conducted in person.  BP 116/72   Pulse 84   Temp 98.6 F (37 C) (Temporal)   Ht 6' (1.829 m)   Wt 205 lb (93 kg)   SpO2 98%   BMI 27.80 kg/m    CC: CPE/AMW Subjective:   HPI: ABDULLAH RIZZI is a 75 y.o. male presenting on 03/08/2021 for Annual Exam   Did not see health advisor this year.   No results found.  Flowsheet Row Chronic Care Management from 02/16/2021 in [place] at [facility] Total Score 0       Fall Risk  03/08/2021 02/15/2021 01/12/2020 01/09/2019 01/01/2018  Falls in the past year? 0 (No Data) 0 0 No  Comment - patient states unsure.  He states he does not remember.  He states he has had several incidents of low blood sugar with paramedic assistance. - - -  Number falls in past yr: 0 - 0 - -  Injury with Fall? 0 - 0 - -  Risk for fall due to : - - Medication side effect - -  Follow up - - Falls evaluation completed;Falls prevention discussed - -    Not fasting today.   Recent significant trouble with hypoglycemia - seen endo in interim with some interventions to help treat this. Now using dexcom continuous glucose meter. Has device that alarms him when sugars start dropping. Planning to meet Debbora Dus pharmacist later today.   Preventative: COLONOSCOPY 01/2018 - TAx2, diverticulosis, int hemorrhoids, rpt 5 yrs ([name]). H/o Barrett's continues daily PPI  Prostate cancer screening - s/p TURP for BPH. Released from [facility]. Normal voiding, check PSA yearly  Lung cancer screening - ~25PY hx over lifetime. Lung cancer screening schedule for 03/23/2021  Flu shot yearly  [place] 10/2019 x2, booster x1 Pneumovax 06/2011, 2018. Prevnar 05/2013 Tetanus - 05/2013 zostavax - 07/2014 shingrix - 04/2017, 12/2017 Advanced planning - thinks has living will at home but unsure. Asked to bring [place] a copy. Son Delaine Canter Hagadorn would be  HCPOA. Would want to be resuscitated.  Seat belt use discussed.  Sunscreen use discussed. No changing moles on skin.  Ex smoker quit 2013, 1/2 ppd for about 50 yrs  Alcohol - a few mixed drinks/night  Dentist - due  Eye exam - yearly, cataract surgery  Bowel - no constipation, some diarrhea Bladder - no incontinence   Owner/operater of dental lab-sold. Fully retired. Married 1969 2 daughters 1972, 1976; 1 son 21, 7 grandchildren Edu: 10th Grade Hobby: car restoration: has '66 Vette, two classic Chevelle SS's and is restoring a '37 chevy coup Activity: works yard daily (11.5 acres), no regular exercise Diet: good water, fruits/vegetables daily     Relevant past medical, surgical, family and social history reviewed and updated as indicated. Interim medical history since our last visit reviewed. Allergies and medications reviewed and updated. Outpatient Medications Prior to Visit  Medication Sig Dispense Refill   amLODipine (NORVASC) 5 MG tablet Take 1 tablet (5 mg total) by mouth daily. 90 tablet 0   b complex vitamins tablet Take 1 tablet by mouth daily.     Continuous Blood Gluc Transmit (DEXCOM G6 TRANSMITTER) MISC 1 Device by Does not apply route every 3 (three) months. 1 each 3   Dextrose, Diabetic Use, (GLUCOSE PO) Take 1 tablet by mouth as needed.  glucose blood (ONETOUCH ULTRA) test strip USE AS DIRECTED FOUR TIMES DAILY Dx code E11.65 150 strip 3   GVOKE HYPOPEN 1-PACK 1 MG/0.2ML SOAJ Inject under skin 0.2 mL as needed for hypoglycemia 0.2 mL 11   insulin degludec (TRESIBA FLEXTOUCH) 200 UNIT/ML FlexTouch Pen Inject under skin 28 units daily 12 mL 3   Insulin Pen Needle (ADVOCATE INSULIN PEN NEEDLES) 31G X 5 MM MISC Use 3x a day 300 each 3   metFORMIN (GLUCOPHAGE-XR) 500 MG 24 hr tablet Take 1 tablet (500 mg total) by mouth daily with breakfast. 360 tablet 0   NOVOLOG FLEXPEN 100 UNIT/ML FlexPen INJECT 10-18 UNITS into THE SKIN THREE TIMES DAILY with meals 60 mL 0    olmesartan-hydrochlorothiazide (BENICAR HCT) 40-25 MG tablet Take 1 tablet by mouth daily. 90 tablet 0   pantoprazole (PROTONIX) 40 MG tablet Take 1 tablet (40 mg total) by mouth daily. 90 tablet 0   Semaglutide, 1 MG/DOSE, (OZEMPIC, 1 MG/DOSE,) 4 MG/3ML SOPN Inject 1 mg into the skin once a week. 9 mL 3   simvastatin (ZOCOR) 20 MG tablet TAKE 1 TABLET BY MOUTH EVERYDAY AT BEDTIME 90 tablet 0   tadalafil (CIALIS) 20 MG tablet Take 0.5-1 tablets (10-20 mg total) by mouth every other day as needed for erectile dysfunction. 5 tablet 0   No facility-administered medications prior to visit.     Per HPI unless specifically indicated in ROS section below Review of Systems  Constitutional:  Negative for activity change, appetite change, chills, fatigue, fever and unexpected weight change.  HENT:  Negative for hearing loss.   Eyes:  Negative for visual disturbance.  Respiratory:  Negative for cough, chest tightness, shortness of breath and wheezing.   Cardiovascular:  Negative for chest pain, palpitations and leg swelling.  Gastrointestinal:  Negative for abdominal distention, abdominal pain, blood in stool, constipation, diarrhea, nausea and vomiting.  Genitourinary:  Negative for difficulty urinating and hematuria.  Musculoskeletal:  Negative for arthralgias, myalgias and neck pain.  Skin:  Negative for rash.  Neurological:  Negative for dizziness, seizures, syncope and headaches.  Hematological:  Negative for adenopathy. Does not bruise/bleed easily.  Psychiatric/Behavioral:  Negative for dysphoric mood. The patient is not nervous/anxious.    Objective:  BP 116/72   Pulse 84   Temp 98.6 F (37 C) (Temporal)   Ht 6' (1.829 m)   Wt 205 lb (93 kg)   SpO2 98%   BMI 27.80 kg/m   Wt Readings from Last 3 Encounters:  03/08/21 205 lb (93 kg)  02/17/21 212 lb (96.2 kg)  12/16/20 204 lb 3.2 oz (92.6 kg)      Physical Exam Vitals and nursing note reviewed.  Constitutional:      General: He  is not in acute distress.    Appearance: Normal appearance. He is well-developed. He is not ill-appearing.  HENT:     Head: Normocephalic and atraumatic.     Right Ear: Hearing, tympanic membrane, ear canal and external ear normal.     Left Ear: Hearing, tympanic membrane, ear canal and external ear normal.  Eyes:     General: No scleral icterus.    Extraocular Movements: Extraocular movements intact.     Conjunctiva/sclera: Conjunctivae normal.     Pupils: Pupils are equal, round, and reactive to light.  Neck:     Thyroid: No thyroid mass or thyromegaly.     Vascular: No carotid bruit.  Cardiovascular:     Rate and Rhythm: Normal rate and regular  rhythm.     Pulses: Normal pulses.          Radial pulses are 2+ on the right side and 2+ on the left side.     Heart sounds: Normal heart sounds. No murmur heard. Pulmonary:     Effort: Pulmonary effort is normal. No respiratory distress.     Breath sounds: Normal breath sounds. No wheezing, rhonchi or rales.  Abdominal:     General: Bowel sounds are normal. There is no distension.     Palpations: Abdomen is soft. There is no mass.     Tenderness: There is no abdominal tenderness. There is no guarding or rebound.     Hernia: No hernia is present.  Musculoskeletal:        General: Normal range of motion.     Cervical back: Normal range of motion and neck supple.     Right lower leg: No edema.     Left lower leg: No edema.  Lymphadenopathy:     Cervical: No cervical adenopathy.  Skin:    General: Skin is warm and dry.     Findings: No rash.  Neurological:     General: No focal deficit present.     Mental Status: He is alert and oriented to person, place, and time.     Comments:  Recall 2/3, 3/3 with cue Calculation 4/5 DLOW   Psychiatric:        Mood and Affect: Mood normal.        Behavior: Behavior normal.        Thought Content: Thought content normal.        Judgment: Judgment normal.      Results for orders placed or  performed in visit on 03/08/21  RPR  Result Value Ref Range   RPR Ser Ql NON-REACTIVE NON-REACTIVE  PSA  Result Value Ref Range   PSA 3.10 0.10 - 4.00 ng/mL  TSH  Result Value Ref Range   TSH 2.83 0.35 - 5.50 uIU/mL  Comprehensive metabolic panel  Result Value Ref Range   Sodium 141 135 - 145 mEq/L   Potassium 3.8 3.5 - 5.1 mEq/L   Chloride 102 96 - 112 mEq/L   CO2 31 19 - 32 mEq/L   Glucose, Bld 129 (H) 70 - 99 mg/dL   BUN 18 6 - 23 mg/dL   Creatinine, Ser 1.08 0.40 - 1.50 mg/dL   Total Bilirubin 1.0 0.2 - 1.2 mg/dL   Alkaline Phosphatase 99 39 - 117 U/L   AST 12 0 - 37 U/L   ALT 13 0 - 53 U/L   Total Protein 6.7 6.0 - 8.3 g/dL   Albumin 4.5 3.5 - 5.2 g/dL   GFR 68.45 >60.00 mL/min   Calcium 9.7 8.4 - 10.5 mg/dL  Lipid panel  Result Value Ref Range   Cholesterol 153 0 - 200 mg/dL   Triglycerides 120.0 0.0 - 149.0 mg/dL   HDL 42.60 >39.00 mg/dL   VLDL 24.0 0.0 - 40.0 mg/dL   LDL Cholesterol 87 0 - 99 mg/dL   Total CHOL/HDL Ratio 4    NonHDL 110.80   Vitamin B12  Result Value Ref Range   Vitamin B-12 707 211 - 911 pg/mL   Lab Results  Component Value Date   HGBA1C 6.9 (A) 02/17/2021    Assessment & Plan:  This visit occurred during the SARS-CoV-2 public health emergency.  Safety protocols were in place, including screening questions prior to the visit, additional usage of staff PPE,  and extensive cleaning of exam room while observing appropriate contact time as indicated for disinfecting solutions.   Problem List Items Addressed This Visit     Medicare annual wellness visit, subsequent - Primary (Chronic)    I have personally reviewed the Medicare Annual Wellness questionnaire and have noted 1. The patient's medical and social history 2. Their use of alcohol, tobacco or illicit drugs 3. Their current medications and supplements 4. The patient's functional ability including ADL's, fall risks, home safety risks and hearing or visual impairment. Cognitive function  has been assessed and addressed as indicated.  5. Diet and physical activity 6. Evidence for depression or mood disorders The patients weight, height, BMI have been recorded in the chart. I have made referrals, counseling and provided education to the patient based on review of the above and I have provided the pt with a written personalized care plan for preventive services. Provider list updated.. See scanned questionairre as needed for further documentation. Reviewed preventative protocols and updated unless pt declined.       Advanced care planning/counseling discussion (Chronic)    Advanced planning - thinks has living will at home but unsure. Asked to bring [place] a copy. Son Maisen Klingler Qadir would be HCPOA. Would want to be resuscitated.        Health maintenance examination (Chronic)    Preventative protocols reviewed and updated unless pt declined. Discussed healthy diet and lifestyle.        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 ([name])   Hyperlipidemia associated with type 2 diabetes mellitus ([name])    Continue simvastatin 20mg  daily  The 10-year ASCVD risk score Mikey Bussing DC Jr., et al., 2013) is: 33.9%   Values used to calculate the score:     Age: 81 years     Sex: Male     Is Non-Hispanic African American: No     Diabetic: Yes     Tobacco smoker: No     Systolic Blood Pressure: 300 mmHg     Is BP treated: Yes     HDL Cholesterol: 42.6 mg/dL     Total Cholesterol: 153 mg/dL        GERD    Continues daily pantoprazole        Benign prostatic hyperplasia with urinary obstruction    Stable period s/p TURP, no longer sees urology or on prostate medication.        Barrett's esophagus    Continue daily protonix.        Ex-smoker    Quit smoking. Undergoing lung cancer screening.        COPD (chronic obstructive pulmonary disease) ([place])    Ex smoker. Denies respiratory symptoms.        Coronary artery  calcification seen on CAT scan    Continue statin. Saw cardiology, monitoring for now.        Aortic atherosclerosis ([name])    Continue statin.        Memory impairment    Ongoing difficulty endorsed by pt.  Check dementia labwork. I asked him to return in 3 months for geriatric assessment.        Type 2 diabetes mellitus with hypoglycemia ([place])    Struggling with severe hypoglycemia closely followed by endo - now has Gvoke glucagon injection to use. Seems to be doing better with closer adherence to insulin regimen and more regular tresiba use. Appreciate endo care.          No orders of  the defined types were placed in this encounter.  No orders of the defined types were placed in this encounter.   Patient instructions: Labs today.  Send [place] date of COVID booster to update your chart.  I'm glad sugars are doing some better Bring [place] copy of your living will.  Return in 3-4 months for formal memory assessment.   Follow up plan: Return in about 3 months (around 06/08/2021) for follow up visit.  [name], MD

## 2021-03-08 NOTE — Assessment & Plan Note (Signed)
Advanced planning - thinks has living will at home but unsure. Asked to bring [place] a copy. Son Vimal Derego Kittler would be HCPOA. Would want to be resuscitated.

## 2021-03-09 ENCOUNTER — Telehealth: Payer: Self-pay

## 2021-03-09 LAB — COMPREHENSIVE METABOLIC PANEL
ALT: 13 U/L (ref 0–53)
AST: 12 U/L (ref 0–37)
Albumin: 4.5 g/dL (ref 3.5–5.2)
Alkaline Phosphatase: 99 U/L (ref 39–117)
BUN: 18 mg/dL (ref 6–23)
CO2: 31 mEq/L (ref 19–32)
Calcium: 9.7 mg/dL (ref 8.4–10.5)
Chloride: 102 mEq/L (ref 96–112)
Creatinine, Ser: 1.08 mg/dL (ref 0.40–1.50)
GFR: 68.45 mL/min (ref >60.00)
Glucose, Bld: 129 mg/dL — ABNORMAL HIGH (ref 70–99)
Potassium: 3.8 mEq/L (ref 3.5–5.1)
Sodium: 141 mEq/L (ref 135–145)
Total Bilirubin: 1 mg/dL (ref 0.2–1.2)
Total Protein: 6.7 g/dL (ref 6.0–8.3)

## 2021-03-09 LAB — RPR: RPR Ser Ql: NONREACTIVE

## 2021-03-09 LAB — VITAMIN B12: Vitamin B-12: 707 pg/mL (ref 211–911)

## 2021-03-09 LAB — LIPID PANEL
Cholesterol: 153 mg/dL (ref 0–200)
HDL: 42.6 mg/dL (ref >39.00)
LDL Cholesterol: 87 mg/dL (ref 0–99)
NonHDL: 110.8
Total CHOL/HDL Ratio: 4
Triglycerides: 120 mg/dL (ref 0.0–149.0)
VLDL: 24 mg/dL (ref 0.0–40.0)

## 2021-03-09 LAB — PSA: PSA: 3.1 ng/mL (ref 0.10–4.00)

## 2021-03-09 LAB — TSH: TSH: 2.83 u[IU]/mL (ref 0.35–5.50)

## 2021-03-09 NOTE — Progress Notes (Addendum)
Chronic Care Management Pharmacy Assistant   Name: Anthony [name]  MRN: [id number] DOB: Aug 21, 1946  Reason for Encounter: Medication Adherence and Delivery Coordination  Recent office visits:  03/09/21- PCP- [facility]- Ordered the following labs: CMP, lipid panel, PSA, RPR, TSH, Vitamin B12. Follow up in 4 months for memory assessment. Follow up in 3 months for follow up visit.  Recent consult visits:  None since last [facility] visits:  None in previous 6 months  Medications: Outpatient Encounter Medications as of 03/09/2021  Medication Sig   amLODipine (NORVASC) 5 MG tablet Take 1 tablet (5 mg total) by mouth daily.   b complex vitamins tablet Take 1 tablet by mouth daily.   Continuous Blood Gluc Transmit (DEXCOM G6 TRANSMITTER) MISC 1 Device by Does not apply route every 3 (three) months.   Dextrose, Diabetic Use, (GLUCOSE PO) Take 1 tablet by mouth as needed.   glucose blood (ONETOUCH ULTRA) test strip USE AS DIRECTED FOUR TIMES DAILY Dx code E11.65   GVOKE HYPOPEN 1-PACK 1 MG/0.2ML SOAJ Inject under skin 0.2 mL as needed for hypoglycemia   insulin degludec (TRESIBA FLEXTOUCH) 200 UNIT/ML FlexTouch Pen Inject under skin 28 units daily   Insulin Pen Needle (ADVOCATE INSULIN PEN NEEDLES) 31G X 5 MM MISC Use 3x a day   metFORMIN (GLUCOPHAGE-XR) 500 MG 24 hr tablet Take 1 tablet (500 mg total) by mouth daily with breakfast.   NOVOLOG FLEXPEN 100 UNIT/ML FlexPen INJECT 10-18 UNITS into THE SKIN THREE TIMES DAILY with meals   olmesartan-hydrochlorothiazide (BENICAR HCT) 40-25 MG tablet Take 1 tablet by mouth daily.   pantoprazole (PROTONIX) 40 MG tablet Take 1 tablet (40 mg total) by mouth daily.   Semaglutide, 1 MG/DOSE, (OZEMPIC, 1 MG/DOSE,) 4 MG/3ML SOPN Inject 1 mg into the skin once a week.   simvastatin (ZOCOR) 20 MG tablet TAKE 1 TABLET BY MOUTH EVERYDAY AT BEDTIME   tadalafil (CIALIS) 20 MG tablet Take 0.5-1 tablets (10-20 mg total) by mouth every other day as  needed for erectile dysfunction.   No facility-administered encounter medications on file as of 03/09/2021.   BP Readings from Last 3 Encounters:  03/08/21 116/72  02/17/21 (!) 148/80  12/16/20 128/78    Lab Results  Component Value Date   HGBA1C 6.9 (A) 02/17/2021    Last adherence delivery date:  02/17/21      Patient is due for next adherence delivery on: 03/21/21  Spoke with patient on 03/09/21 reviewed medications and coordinated delivery.  This delivery to include: Vials  30 Days  Test Strips One Touch Ultra - Use as directed 4 times daily             Amlodipine 5 mg 1 tablet daily Pantoprazole   40 mg 1 tablet daily Ozempic 1mg  dose Pen inject into the skin once a week      Tadalafil 20 mg - 1/2 to 1 tablet daily as needed- PRN use Tresiba Flex 200 units- 28 units once daily at bedtime (is going to start taking in the morning)  Patient declined the following medications this month: Olmesartan-HCTZ 40/25 mg 1 tablet daily - PCP sent 90 DS to [facility]. Patient picked up 7/15. Simvastatin 20 mg 1 tablet daily - PCP sent 90 DS to [facility]. Patient picked up 7/15. Metformin 500 mg 1 tablet daily - States he has a lot of metformin due to significant dose decrease in the past. Advised him we would follow up with him next month.  Novolog Flexpen Inject 10-18 units into the skin 3 times daily with meals. (delivered 7/11) GVOKE  - inject 0.2 ml under skin as needed for hypoglycemia PRN (delivered 7/11)            Any concerns about your medications? Yes- concerned that Anthony [name] is dropping his sugar too low  How often do you forget or accidentally miss a dose? Rarely  Do you use a pillbox? No  Is patient in adherence packaging? No  Refills requested from PCP include: Tadalafil 20 mg - requested 03/09/21  Confirmed delivery date of 03/21/21, advised patient that pharmacy will contact him the morning of delivery.  Recent blood pressure readings are as follows: not checking his BP  Recent  blood glucose readings are as follows: Patient has dexcom and has not been writing his readings down. States he can not recall what readings have been. Does note he had a rough night after evening meal last night where his sugar dropped and he used a glucose tablet and sugar would go up a small amount and drop again. States this happened a few times last night.  He states he injected 15 units of Novolog prior to dinner. He had steak and potatoes. At bedtime he only injected 12 units of Tresiba due to low BG levels. Patient is going to start injecting [name] in the morning. Advised patient he should be injecting 28 units of [name] daily. He states he is concerned about doing that much due to low blood sugars in the evening. Anthony [name] consulted and agreed he may take the [name] in the mornings instead.  Discussed patient assistance for Ozempic and patient states his income is too high to be approved.   Anthony [name], CPP notified  Anthony [name], [place] Pharmacy Assistant [phone]  I have reviewed the care management and care coordination activities outlined in this encounter and I am certifying that I agree with the content of this note. No further action required.  Anthony [name], PharmD Clinical Pharmacist [facility] [phone]

## 2021-03-10 ENCOUNTER — Encounter: Payer: Self-pay | Admitting: Family Medicine

## 2021-03-10 DIAGNOSIS — E11649 Type 2 diabetes mellitus with hypoglycemia without coma: Secondary | ICD-10-CM | POA: Insufficient documentation

## 2021-03-10 NOTE — Assessment & Plan Note (Signed)
Ongoing difficulty endorsed by pt.  Check dementia labwork. I asked him to return in 3 months for geriatric assessment.

## 2021-03-10 NOTE — Assessment & Plan Note (Signed)
Continues daily pantoprazole.  

## 2021-03-10 NOTE — Assessment & Plan Note (Signed)
Continue simvastatin 20mg  daily  The 10-year ASCVD risk score Mikey Bussing DC Jr., et al., 2013) is: 33.9%   Values used to calculate the score:     Age: 75 years     Sex: Male     Is Non-Hispanic African American: No     Diabetic: Yes     Tobacco smoker: No     Systolic Blood Pressure: 700 mmHg     Is BP treated: Yes     HDL Cholesterol: 42.6 mg/dL     Total Cholesterol: 153 mg/dL

## 2021-03-10 NOTE — Assessment & Plan Note (Signed)
Stable period s/p TURP, no longer sees urology or on prostate medication.

## 2021-03-10 NOTE — Assessment & Plan Note (Signed)
Struggling with severe hypoglycemia closely followed by endo - now has Gvoke glucagon injection to use. Seems to be doing better with closer adherence to insulin regimen and more regular tresiba use. Appreciate endo care.

## 2021-03-10 NOTE — Assessment & Plan Note (Signed)
Continue statin. Saw cardiology, monitoring for now.

## 2021-03-10 NOTE — Assessment & Plan Note (Signed)
Continue daily protonix.  

## 2021-03-10 NOTE — Assessment & Plan Note (Signed)
Continue statin. 

## 2021-03-10 NOTE — Assessment & Plan Note (Addendum)
Quit smoking. Undergoing lung cancer screening.

## 2021-03-10 NOTE — Assessment & Plan Note (Signed)
Ex smoker. Denies respiratory symptoms.

## 2021-03-11 ENCOUNTER — Other Ambulatory Visit: Payer: Self-pay | Admitting: Family Medicine

## 2021-03-15 ENCOUNTER — Ambulatory Visit: Payer: Medicare HMO | Admitting: Internal Medicine

## 2021-03-16 ENCOUNTER — Other Ambulatory Visit: Payer: Self-pay | Admitting: Family Medicine

## 2021-03-21 ENCOUNTER — Ambulatory Visit: Payer: Medicare HMO

## 2021-03-21 DIAGNOSIS — Z794 Long term (current) use of insulin: Secondary | ICD-10-CM | POA: Diagnosis not present

## 2021-03-21 DIAGNOSIS — E11649 Type 2 diabetes mellitus with hypoglycemia without coma: Secondary | ICD-10-CM | POA: Diagnosis not present

## 2021-03-21 DIAGNOSIS — E083291 Diabetes mellitus due to underlying condition with mild nonproliferative diabetic retinopathy without macular edema, right eye: Secondary | ICD-10-CM | POA: Diagnosis not present

## 2021-03-21 DIAGNOSIS — I10 Essential (primary) hypertension: Secondary | ICD-10-CM

## 2021-03-21 NOTE — Chronic Care Management (AMB) (Signed)
Chronic Care Management   CCM RN Visit Note  03/21/2021 Name: Anthony [name] MRN: [id number] DOB: Jan 09, 1946  Subjective: Anthony [name] is a 75 y.o. year old male who is a primary care patient of [name], MD. The care management team was consulted for assistance with disease management and care coordination needs.    Engaged with patient by telephone for follow up visit in response to provider referral for case management and/or care coordination services.   Consent to Services:  The patient was given information about Chronic Care Management services, agreed to services, and gave verbal consent prior to initiation of services.  Please see initial visit note for detailed documentation.   Patient agreed to services and verbal consent obtained.   Assessment: Review of patient past medical history, allergies, medications, health status, including review of consultants reports, laboratory and other test data, was performed as part of comprehensive evaluation and provision of chronic care management services.   SDOH (Social Determinants of Health) assessments and interventions performed:    CCM Care Plan  No Known Allergies  Outpatient Encounter Medications as of 03/21/2021  Medication Sig   amLODipine (NORVASC) 5 MG tablet Take 1 tablet (5 mg total) by mouth daily.   b complex vitamins tablet Take 1 tablet by mouth daily.   insulin degludec (TRESIBA FLEXTOUCH) 200 UNIT/ML FlexTouch Pen Inject under skin 28 units daily   metFORMIN (GLUCOPHAGE-XR) 500 MG 24 hr tablet Take 1 tablet (500 mg total) by mouth daily with breakfast.   NOVOLOG FLEXPEN 100 UNIT/ML FlexPen INJECT 10-18 UNITS into THE SKIN THREE TIMES DAILY with meals   olmesartan-hydrochlorothiazide (BENICAR HCT) 40-25 MG tablet TAKE 1 TABLET BY MOUTH EVERY DAY   pantoprazole (PROTONIX) 40 MG tablet Take 1 tablet (40 mg total) by mouth daily.   Semaglutide, 1 MG/DOSE, (OZEMPIC, 1 MG/DOSE,) 4 MG/3ML SOPN Inject 1 mg  into the skin once a week.   simvastatin (ZOCOR) 20 MG tablet TAKE 1 TABLET BY MOUTH EVERYDAY AT BEDTIME   tadalafil (CIALIS) 20 MG tablet TAKE 1/2 TABLET - ONE tab BY MOUTH EVERY OTHER DAY AS NEEDED FOR erectile dysfunction   Continuous Blood Gluc Transmit (DEXCOM G6 TRANSMITTER) MISC 1 Device by Does not apply route every 3 (three) months.   Dextrose, Diabetic Use, (GLUCOSE PO) Take 1 tablet by mouth as needed.   glucose blood (ONETOUCH ULTRA) test strip USE AS DIRECTED FOUR TIMES DAILY Dx code E11.65   GVOKE HYPOPEN 1-PACK 1 MG/0.2ML SOAJ Inject under skin 0.2 mL as needed for hypoglycemia   Insulin Pen Needle (ADVOCATE INSULIN PEN NEEDLES) 31G X 5 MM MISC Use 3x a day   No facility-administered encounter medications on file as of 03/21/2021.    Patient Active Problem List   Diagnosis Date Noted   Type 2 diabetes mellitus with hypoglycemia ([place]) 03/10/2021   Memory impairment 02/10/2021   Non-adherence to medical treatment 10/27/2020   COPD (chronic obstructive pulmonary disease) ([place]) 02/28/2020   Coronary artery calcification seen on CAT scan 02/28/2020   Aortic atherosclerosis ([place]) 02/28/2020   Ex-smoker 01/21/2020   RLQ abdominal pain 01/21/2020   Numbness and tingling in left arm 01/21/2020   Health maintenance examination 01/05/2017   Right knee pain 01/18/2016   Barrett's esophagus 12/08/2015   Abnormal tympanic membrane of right ear 07/05/2015   Advanced care planning/counseling discussion 06/30/2014   Medicare annual wellness visit, subsequent 06/22/2013   Benign prostatic hyperplasia with urinary obstruction 06/18/2013   ED (erectile dysfunction) 04/10/2012  Hyperlipidemia associated with type 2 diabetes mellitus ([place]) 12/29/2008   LEG PAIN, BILATERAL 11/19/2008   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 ([place]) 07/24/2007   ANXIETY 07/24/2007   GERD 07/24/2007    Conditions to be addressed/monitored:HTN and DMII  Care Plan :  Diabetes Type 2 (Adult)  Updates made by Dannielle Karvonen, RN since 03/21/2021 12:00 AM   Problem: Diabetes with hypertension management   Priority: High   Long-Range Goal: Glycemic/ blood pressure management   Start Date: 02/15/2021  Expected End Date: 06/27/2021  This Visit's Progress: On track  Recent Progress: On track  Priority: High  Objective:  Lab Results  Component Value Date   HGBA1C 6.9 (A) 02/17/2021   Lab Results  Component Value Date   CREATININE 1.08 03/08/2021   CREATININE 1.02 03/02/2020   CREATININE 0.91 01/12/2020  Last practice recorded BP readings:  BP Readings from Last 3 Encounters:  12/16/20 128/78  12/15/20 114/83  12/02/20 114/70  Current Barriers:  Patient confirms having annual wellness visit with primary care provider on 03/08/2021.  Patient states he continues to have low blood sugars. He reports having a recent visit with EMS due to low blood sugar.  Patient reports having follow up visit with his endocrinologist, Dr. Emeterio Reeve on 02/17/2021.   He states they discussed how patient should currently be taking his diabetic medications. Patient states he also met with a nurse in the office who reviewed his blood sugar readings and discussed diabetic management. Patient reports having a telephone visit with Debbora Dus, practice pharmacist. He states they discussed the cost of his Ozempic and him being currently in the donut hole. Patient states he is not sure he will qualify for medication assistance due to his current financial status but states he would be glad for any help.    Knowledge Deficits related to basic Diabetes / Hypertension pathophysiology and self care/management Case Manager Clinical Goal(s):  patient will demonstrate improved adherence to prescribed treatment plan for diabetes self care/management as evidenced by: daily monitoring and recording of CBG  adherence to ADA/ carb modified diet adherence to prescribed medication regimen contacting  provider for new or worsened symptoms or questions:  Patient states he continues to use his DEXCOM for blood sugar monitoring. He states when his blood sugar is low it will alarm and also contact his children to inform them of his low reading. RNCM voiced concern regarding frequent low blood sugar readings.  Reviewed hypoglycemic management with patient. Patient verbalized understanding. Patient states his blood sugars drop sometimes because he is late eating his lunch. He states he works out in his yard often and may eat his lunch late. Patient also reports the Ozempic medication causes him to not have much of an appetite. He states it is difficult to force himself to eat.  RNCM discussed with patient being on a more structured lunch time to avoid low blood sugars during the day.  Advised patient to also keep some form of snack with him to eat if he knows he will be running late for lunch.  RNCM discussed with patient having one of his children with him at his endocrinology appointments for additional support and memory of advised recommendations by provider.  Consider having family assist  with medication reminders. Also advised patient to keep an updated list of his medications (especially diabetic medications) on the refrigerator as a constant visual reminder of how to take his medications.   Reviewed current medications with patient.  patient will verbalize understanding of plan for hypertension management patient will attend all scheduled medical appointments: patient will demonstrate improved adherence to prescribed treatment plan for hypertension as evidenced by taking all medications as prescribed, monitoring and recording blood pressure as directed, adhering to low sodium/DASH diet Interventions:  Collaboration with [name], MD regarding development and update of comprehensive plan of care as evidenced by provider attestation and co-signature Inter-disciplinary care team collaboration (see  longitudinal plan of care) Provided written and verbal education to patient about basic DM  and HTN disease process Reviewed medications with patient and discussed importance of medication adherence Discussed plans with patient for ongoing care management follow up and provided patient with direct contact information for care management team Provided patient with written educational materials related to hypo and hyperglycemia and importance of correct treatment Reviewed scheduled/upcoming provider appointments:  Ophthalmology follow up 05/11/2021, Podiatry appointment 05/16/2021 Provided education to patient re: stroke prevention, s/s of heart attack and stroke, DASH diet, complications of uncontrolled blood pressure Advised patient, providing education and rationale, to monitor blood pressure daily and record, calling PCP for findings outside established parameters.  Self-Care Activities  Self administers oral and insulin medications as prescribed Attends all scheduled provider appointments Checks blood sugars as prescribed and utilize hyper and hypoglycemia protocol as needed Adheres to prescribed ADA/carb modified Attends all scheduled provider appointments Calls provider office for new concerns, questions, or BP outside discussed parameters Checks BP and records as discussed Follows a low sodium diet/DASH diet Patient Goals: - check blood sugar at prescribed times - check blood sugar if I feel it is too high or too low - take the blood sugar meter/ log to all doctor visits - Keep current/ updated medication list on your refrigerator for constant reminder -Consider having family member accompany you to your endocrinology appointments.  Consider having family assist  with medication reminders.  - Keep a snack with you to eat if you know you will be late for lunch.  Place yourself on a structured time for lunch (to help combat low blood sugar readings).  Try not to miss this time  RULE OF  15 How to treat low blood sugars (Blood sugar less than 70 mg/dl  Please follow the RULE OF 15 for the treatment of hypoglycemia treatment (When your blood sugars are less than 70 mg/ dl) STEP  1:  Take 15 grams of carbohydrates when your blood sugar is low, which includes:   3-4 glucose tabs or  3-4 oz of juice or regular soda or  One tube of glucose gel STEP 2:  Recheck blood sugar in 15 minutes STEP 3:  If your blood sugar is still low at the 15 minute recheck ---then, go back to STEP 1 and treat again with another 15 grams of carbohydrates -Check blood pressure at least 1-2 times per week - Take your medications as prescribed - Report new or ongoing symptoms to your provider as soon as possible.  - Keep your follow up appointments with your doctors.  Follow Up Plan: The patient has been provided with contact information for the care management team and has been advised to call with any health related questions or concerns.  The care management team will reach out to the patient again over the next 30 days.        Plan:The patient has been provided with contact information for the care management team and has been advised to call with any health related questions or concerns.  and The care management team will reach out to the patient again over the next 45 days. Quinn Plowman RN,BSN,CCM RN Case Manager [place]  [phone]

## 2021-03-21 NOTE — Patient Instructions (Signed)
Visit Information:  Thank you for taking the time to speak with me today  PATIENT GOALS:  Goals Addressed             This Visit's Progress    Monitor and Manage My Blood Sugar-Diabetes Type 2 and Blood pressure - Hypertension   On track    Timeframe:  Long-Range Goal Priority:  High Start Date:   02/15/2021                          Expected End Date: 06/27/2021                   Follow Up Date 04/04/2021   - check blood sugar at prescribed times - check blood sugar if I feel it is too high or too low - take the blood sugar meter/ log to all doctor visits - Keep current/ updated medication list on your refrigerator for constant reminder -Consider having family member accompany you to your endocrinology appointments.  Consider having family assist  with medication reminders.  - Keep a snack with you to eat if you know you will be late for lunch.  Place yourself on a structured time for lunch (to help combat low blood sugar readings).  Try not to miss this time  RULE OF 15 How to treat low blood sugars (Blood sugar less than 70 mg/dl  Please follow the RULE OF 15 for the treatment of hypoglycemia treatment (When your blood sugars are less than 70 mg/ dl) STEP  1:  Take 15 grams of carbohydrates when your blood sugar is low, which includes:   3-4 glucose tabs or  3-4 oz of juice or regular soda or  One tube of glucose gel STEP 2:  Recheck blood sugar in 15 minutes STEP 3:  If your blood sugar is still low at the 15 minute recheck ---then, go back to STEP 1 and treat again with another 15 grams of carbohydrates -Check blood pressure at least 1-2 times per week - Take your medications as prescribed - Report new or ongoing symptoms to your provider as soon as possible.  - Keep your follow up appointments with your doctors.     Why is this important?   Checking your blood sugar at home helps to keep it from getting very high or very low.  Writing the results in a diary or log helps the  doctor know how to care for you.  Your blood sugar log should have the time, date and the results.  Also, write down the amount of insulin or other medicine that you take.  Other information, like what you ate, exercise done and how you were feeling, will also be helpful.           The patient verbalized understanding of instructions, educational materials, and care plan provided today and agreed to receive a mailed copy of patient instructions, educational materials, and care plan.   The patient has been provided with contact information for the care management team and has been advised to call with any health related questions or concerns.  The care management team will reach out to the patient again over the next 45 days.  Quinn Plowman RN,BSN,CCM RN Case Manager [place]  [phone]

## 2021-03-23 ENCOUNTER — Other Ambulatory Visit: Payer: Self-pay

## 2021-03-23 ENCOUNTER — Ambulatory Visit
Admission: RE | Admit: 2021-03-23 | Discharge: 2021-03-23 | Disposition: A | Discharge status: Home / Self Care | Payer: Medicare HMO | Source: Ambulatory Visit | Attending: Acute Care | Admitting: Acute Care

## 2021-03-23 DIAGNOSIS — Z122 Encounter for screening for malignant neoplasm of respiratory organs: Secondary | ICD-10-CM

## 2021-03-24 DIAGNOSIS — Z794 Long term (current) use of insulin: Secondary | ICD-10-CM | POA: Diagnosis not present

## 2021-03-24 DIAGNOSIS — E113291 Type 2 diabetes mellitus with mild nonproliferative diabetic retinopathy without macular edema, right eye: Secondary | ICD-10-CM | POA: Diagnosis not present

## 2021-03-30 ENCOUNTER — Ambulatory Visit
Admission: RE | Admit: 2021-03-30 | Discharge: 2021-03-30 | Disposition: A | Discharge status: Home / Self Care | Payer: Medicare HMO | Source: Ambulatory Visit | Attending: Acute Care | Admitting: Acute Care

## 2021-03-30 ENCOUNTER — Other Ambulatory Visit: Payer: Self-pay

## 2021-03-30 DIAGNOSIS — I2510 Atherosclerotic heart disease of native coronary artery without angina pectoris: Secondary | ICD-10-CM | POA: Diagnosis not present

## 2021-03-30 DIAGNOSIS — J432 Centrilobular emphysema: Secondary | ICD-10-CM | POA: Diagnosis not present

## 2021-03-30 DIAGNOSIS — Z87891 Personal history of nicotine dependence: Secondary | ICD-10-CM | POA: Diagnosis not present

## 2021-03-30 DIAGNOSIS — I700 Atherosclerosis of aorta: Secondary | ICD-10-CM | POA: Diagnosis not present

## 2021-04-04 ENCOUNTER — Ambulatory Visit (INDEPENDENT_AMBULATORY_CARE_PROVIDER_SITE_OTHER): Payer: Medicare HMO

## 2021-04-04 DIAGNOSIS — Z794 Long term (current) use of insulin: Secondary | ICD-10-CM | POA: Diagnosis not present

## 2021-04-04 DIAGNOSIS — I10 Essential (primary) hypertension: Secondary | ICD-10-CM

## 2021-04-04 DIAGNOSIS — E785 Hyperlipidemia, unspecified: Secondary | ICD-10-CM | POA: Diagnosis not present

## 2021-04-04 DIAGNOSIS — E11649 Type 2 diabetes mellitus with hypoglycemia without coma: Secondary | ICD-10-CM

## 2021-04-04 NOTE — Patient Instructions (Signed)
Visit Information:  Thank you for taking the time to speak with me today.   PATIENT GOALS:  Goals Addressed             This Visit's Progress    Monitor and Manage My Blood Sugar-Diabetes Type 2 and Blood pressure - Hypertension   On track    Timeframe:  Long-Range Goal Priority:  High Start Date:   02/15/2021                          Expected End Date: 06/27/2021                   Follow Up Date 04/28/2021  - Continue to check blood sugar at prescribed times - Continue to check blood sugar if I feel it is too high or too low - take the blood sugar meter/ log to all doctor visits - Keep current/ updated medication list on your refrigerator for constant reminder -Consider having family member accompany you to your endocrinology appointments.  Consider having family assist  with medication reminders.  - Keep a snack with you to eat if you know you will be late for lunch.  Place yourself on a structured time for lunch (to help combat low blood sugar readings).  Try not to miss this time  RULE OF 15 How to treat low blood sugars (Blood sugar less than 70 mg/dl  Please follow the RULE OF 15 for the treatment of hypoglycemia treatment (When your blood sugars are less than 70 mg/ dl) STEP  1:  Take 15 grams of carbohydrates when your blood sugar is low, which includes:   3-4 glucose tabs or  3-4 oz of juice or regular soda or  One tube of glucose gel STEP 2:  Recheck blood sugar in 15 minutes STEP 3:  If your blood sugar is still low at the 15 minute recheck ---then, go back to STEP 1 and treat again with another 15 grams of carbohydrates - Continue to check blood pressure at least 1-2 times per week - Continue to take your medications as prescribed - Report new or ongoing symptoms to your provider as soon as possible.  - Keep your follow up appointments with your doctors.      Why is this important?   Checking your blood sugar at home helps to keep it from getting very high or very  low.  Writing the results in a diary or log helps the doctor know how to care for you.  Your blood sugar log should have the time, date and the results.  Also, write down the amount of insulin or other medicine that you take.  Other information, like what you ate, exercise done and how you were feeling, will also be helpful.           The patient verbalized understanding of instructions, educational materials, and care plan provided today and agreed to receive a mailed copy of patient instructions, educational materials, and care plan.   The patient has been provided with contact information for the care management team and has been advised to call with any health related questions or concerns.  The care management team will reach out to the patient again over the next 45 days.   Quinn Plowman RN,BSN,CCM RN Case Manager [place]  [phone]

## 2021-04-04 NOTE — Chronic Care Management (AMB) (Signed)
Chronic Care Management   CCM RN Visit Note  04/04/2021 Name: Anthony [name] MRN: [id number] DOB: 1946/02/13  Subjective: Anthony [name] is a 75 y.o. year old male who is a primary care patient of [name], MD. The care management team was consulted for assistance with disease management and care coordination needs.    Engaged with patient by telephone for follow up visit in response to provider referral for case management and/or care coordination services.   Consent to Services:  The patient was given information about Chronic Care Management services, agreed to services, and gave verbal consent prior to initiation of services.  Please see initial visit note for detailed documentation.   Patient agreed to services and verbal consent obtained.   Assessment: Review of patient past medical history, allergies, medications, health status, including review of consultants reports, laboratory and other test data, was performed as part of comprehensive evaluation and provision of chronic care management services.   SDOH (Social Determinants of Health) assessments and interventions performed:    CCM Care Plan  No Known Allergies  Outpatient Encounter Medications as of 04/04/2021  Medication Sig   amLODipine (NORVASC) 5 MG tablet Take 1 tablet (5 mg total) by mouth daily.   b complex vitamins tablet Take 1 tablet by mouth daily.   Continuous Blood Gluc Transmit (DEXCOM G6 TRANSMITTER) MISC 1 Device by Does not apply route every 3 (three) months.   Dextrose, Diabetic Use, (GLUCOSE PO) Take 1 tablet by mouth as needed.   glucose blood (ONETOUCH ULTRA) test strip USE AS DIRECTED FOUR TIMES DAILY Dx code E11.65   GVOKE HYPOPEN 1-PACK 1 MG/0.2ML SOAJ Inject under skin 0.2 mL as needed for hypoglycemia   insulin degludec (TRESIBA FLEXTOUCH) 200 UNIT/ML FlexTouch Pen Inject under skin 28 units daily   Insulin Pen Needle (ADVOCATE INSULIN PEN NEEDLES) 31G X 5 MM MISC Use 3x a day    metFORMIN (GLUCOPHAGE-XR) 500 MG 24 hr tablet Take 1 tablet (500 mg total) by mouth daily with breakfast.   NOVOLOG FLEXPEN 100 UNIT/ML FlexPen INJECT 10-18 UNITS into THE SKIN THREE TIMES DAILY with meals   olmesartan-hydrochlorothiazide (BENICAR HCT) 40-25 MG tablet TAKE 1 TABLET BY MOUTH EVERY DAY   pantoprazole (PROTONIX) 40 MG tablet Take 1 tablet (40 mg total) by mouth daily.   Semaglutide, 1 MG/DOSE, (OZEMPIC, 1 MG/DOSE,) 4 MG/3ML SOPN Inject 1 mg into the skin once a week.   simvastatin (ZOCOR) 20 MG tablet TAKE 1 TABLET BY MOUTH EVERYDAY AT BEDTIME   tadalafil (CIALIS) 20 MG tablet TAKE 1/2 TABLET - ONE tab BY MOUTH EVERY OTHER DAY AS NEEDED FOR erectile dysfunction   No facility-administered encounter medications on file as of 04/04/2021.    Patient Active Problem List   Diagnosis Date Noted   Type 2 diabetes mellitus with hypoglycemia ([place]) 03/10/2021   Memory impairment 02/10/2021   Non-adherence to medical treatment 10/27/2020   COPD (chronic obstructive pulmonary disease) ([place]) 02/28/2020   Coronary artery calcification seen on CAT scan 02/28/2020   Aortic atherosclerosis ([place]) 02/28/2020   Ex-smoker 01/21/2020   RLQ abdominal pain 01/21/2020   Numbness and tingling in left arm 01/21/2020   Health maintenance examination 01/05/2017   Right knee pain 01/18/2016   Barrett's esophagus 12/08/2015   Abnormal tympanic membrane of right ear 07/05/2015   Advanced care planning/counseling discussion 06/30/2014   Medicare annual wellness visit, subsequent 06/22/2013   Benign prostatic hyperplasia with urinary obstruction 06/18/2013   ED (erectile dysfunction) 04/10/2012  Hyperlipidemia associated with type 2 diabetes mellitus ([place]) 12/29/2008   LEG PAIN, BILATERAL 11/19/2008   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 ([place]) 07/24/2007   ANXIETY 07/24/2007   GERD 07/24/2007    Conditions to be addressed/monitored:HTN and DMII  Care Plan :  Diabetes Type 2 (Adult)  Updates made by Dannielle Karvonen, RN since 04/04/2021 12:00 AM     Problem: Diabetes with hypertension management   Priority: High     Long-Range Goal: Glycemic/ blood pressure management   Start Date: 02/15/2021  Expected End Date: 06/27/2021  Recent Progress: On track  Priority: High  Note:   Objective:  Lab Results  Component Value Date   HGBA1C 6.9 (A) 02/17/2021   Lab Results  Component Value Date   CREATININE 1.08 03/08/2021   CREATININE 1.02 03/02/2020   CREATININE 0.91 01/12/2020  Last practice recorded BP readings:  BP Readings from Last 3 Encounters:  12/16/20 128/78  12/15/20 114/83  12/02/20 114/70  Current Barriers:  Patient states he continue to have blood sugars that are up and down. He states he is scheduled to see his endocrinologist on 04/14/2021.  Patient states he continues to treat any low blood sugars as directed. He states his DEXCOM alerts him to high and low blood sugar readings. He reports today's fasting blood sugar was 227. He reports taking 12 units of his insulin prior to having an egg mcmuffin and hashbrown today for breakfast.  He states his DEXCOM alerted him while on the phone with the nurse that his blood sugar was elevated. Patient states will be eating lunch within the hour. He reports being on a sliding scale with his insulin for meals. He states his range is 12 to 18 units.  He states he plans to eat a sandwich today for lunch so he will take 15 units for coverage prior to eating.  Patient states he has not taken his Ozempic over the last 2-3 weeks. He said the Ozempic makes him not want to eat.  He reports he plans on starting his Ozempic back today.  Patient states he is considering the insulin pump and states he will talk with his endocrinologist at next weeks appointment.  RNReviewed hypoglycemic management with patient.CM    Knowledge Deficits related to basic Diabetes / Hypertension pathophysiology and self  care/management Case Manager Clinical Goal(s):  patient will demonstrate improved adherence to prescribed treatment plan for diabetes self care/management as evidenced by: daily monitoring and recording of CBG  adherence to ADA/ carb modified diet adherence to prescribed medication regimen contacting provider for new or worsened symptoms or questions:   patient will verbalize understanding of plan for hypertension management patient will attend all scheduled medical appointments: patient will demonstrate improved adherence to prescribed treatment plan for hypertension as evidenced by taking all medications as prescribed, monitoring and recording blood pressure as directed, adhering to low sodium/DASH diet Interventions:  Collaboration with [name], MD regarding development and update of comprehensive plan of care as evidenced by provider attestation and co-signature Inter-disciplinary care team collaboration (see longitudinal plan of care) Provided written and verbal education to patient about basic DM  and HTN disease process Reviewed medications with patient and discussed importance of medication adherence Discussed plans with patient for ongoing care management follow up and provided patient with direct contact information for care management team Provided patient with written educational materials related to hypo and hyperglycemia and importance of correct treatment Reviewed scheduled/upcoming provider appointments:  Ophthalmology follow  up 05/11/2021, Podiatry appointment 05/16/2021 Provided education to patient re: stroke prevention, s/s of heart attack and stroke, DASH diet, complications of uncontrolled blood pressure Advised patient, providing education and rationale, to monitor blood pressure daily and record, calling PCP for findings outside established parameters.  Self-Care Activities  Self administers oral and insulin medications as prescribed Attends all scheduled provider  appointments Checks blood sugars as prescribed and utilize hyper and hypoglycemia protocol as needed Adheres to prescribed ADA/carb modified Attends all scheduled provider appointments Calls provider office for new concerns, questions, or BP outside discussed parameters Checks BP and records as discussed Follows a low sodium diet/DASH diet Patient Goals: - Continue to check blood sugar at prescribed times - Continue to check blood sugar if I feel it is too high or too low - take the blood sugar meter/ log to all doctor visits - Keep current/ updated medication list on your refrigerator for constant reminder -Consider having family member accompany you to your endocrinology appointments.  Consider having family assist  with medication reminders.  - Keep a snack with you to eat if you know you will be late for lunch.  Place yourself on a structured time for lunch (to help combat low blood sugar readings).  Try not to miss this time  RULE OF 15 How to treat low blood sugars (Blood sugar less than 70 mg/dl  Please follow the RULE OF 15 for the treatment of hypoglycemia treatment (When your blood sugars are less than 70 mg/ dl) STEP  1:  Take 15 grams of carbohydrates when your blood sugar is low, which includes:   3-4 glucose tabs or  3-4 oz of juice or regular soda or  One tube of glucose gel STEP 2:  Recheck blood sugar in 15 minutes STEP 3:  If your blood sugar is still low at the 15 minute recheck ---then, go back to STEP 1 and treat again with another 15 grams of carbohydrates - Continue to check blood pressure at least 1-2 times per week - Continue to take your medications as prescribed - Report new or ongoing symptoms to your provider as soon as possible.  - Keep your follow up appointments with your doctors.  Follow Up Plan: The patient has been provided with contact information for the care management team and has been advised to call with any health related questions or concerns.   The care management team will reach out to the patient again over the next 30 days.                  Plan:The patient has been provided with contact information for the care management team and has been advised to call with any health related questions or concerns.  and The care management team will reach out to the patient again over the next 45 days. Quinn Plowman RN,BSN,CCM RN Case Manager [place]  [phone]

## 2021-04-04 NOTE — Telephone Encounter (Signed)
This encounter was created in error - please disregard.

## 2021-04-04 NOTE — Progress Notes (Signed)
Please call patient and let them  know their  low dose Ct was read as a Lung RADS 2: nodules that are benign in appearance and behavior with a very low likelihood of becoming a clinically active cancer due to size or lack of growth. Recommendation per radiology is for a repeat LDCT in 12 months. .Please let them  know we will order and schedule their  annual screening scan for 02/2022. Please let them  know there was notation of CAD on their  scan.  Please remind the patient  that this is a non-gated exam therefore degree or severity of disease  cannot be determined. Please have them  follow up with their PCP regarding potential risk factor modification, dietary therapy or pharmacologic therapy if clinically indicated. Pt.  is  currently on statin therapy. Please place order for annual  screening scan for  02/2022 and fax results to PCP. Thanks so much.  PCP has called results but we need to make sure we have follow up scans ordered. Thanks so much

## 2021-04-08 ENCOUNTER — Telehealth: Payer: Self-pay

## 2021-04-08 NOTE — Chronic Care Management (AMB) (Addendum)
Chronic Care Management Pharmacy Assistant   Name: Anthony [name]  MRN: [id number] DOB: 06-02-1946  Reason for Encounter: Medication Adherence and Delivery Coordination  Recent office visits:  04/04/21- CCM RN telephone visit- no medication changes 03/21/21- CCM RN telephone visit-no medication changes   Recent consult visits:  None since last CCM contact  Hospital visits:  None in previous 6 months  Medications: Outpatient Encounter Medications as of 04/08/2021  Medication Sig   amLODipine (NORVASC) 5 MG tablet Take 1 tablet (5 mg total) by mouth daily.   b complex vitamins tablet Take 1 tablet by mouth daily.   Continuous Blood Gluc Transmit (DEXCOM G6 TRANSMITTER) MISC 1 Device by Does not apply route every 3 (three) months.   Dextrose, Diabetic Use, (GLUCOSE PO) Take 1 tablet by mouth as needed.   glucose blood (ONETOUCH ULTRA) test strip USE AS DIRECTED FOUR TIMES DAILY Dx code E11.65   GVOKE HYPOPEN 1-PACK 1 MG/0.2ML SOAJ Inject under skin 0.2 mL as needed for hypoglycemia   insulin degludec (TRESIBA FLEXTOUCH) 200 UNIT/ML FlexTouch Pen Inject under skin 28 units daily   Insulin Pen Needle (ADVOCATE INSULIN PEN NEEDLES) 31G X 5 MM MISC Use 3x a day   metFORMIN (GLUCOPHAGE-XR) 500 MG 24 hr tablet Take 1 tablet (500 mg total) by mouth daily with breakfast.   NOVOLOG FLEXPEN 100 UNIT/ML FlexPen INJECT 10-18 UNITS into THE SKIN THREE TIMES DAILY with meals   olmesartan-hydrochlorothiazide (BENICAR HCT) 40-25 MG tablet TAKE 1 TABLET BY MOUTH EVERY DAY   pantoprazole (PROTONIX) 40 MG tablet Take 1 tablet (40 mg total) by mouth daily.   Semaglutide, 1 MG/DOSE, (OZEMPIC, 1 MG/DOSE,) 4 MG/3ML SOPN Inject 1 mg into the skin once a week.   simvastatin (ZOCOR) 20 MG tablet TAKE 1 TABLET BY MOUTH EVERYDAY AT BEDTIME   tadalafil (CIALIS) 20 MG tablet TAKE 1/2 TABLET - ONE tab BY MOUTH EVERY OTHER DAY AS NEEDED FOR erectile dysfunction   No facility-administered encounter medications  on file as of 04/08/2021.   BP Readings from Last 3 Encounters:  03/08/21 116/72  02/17/21 (!) 148/80  12/16/20 128/78    Lab Results  Component Value Date   HGBA1C 6.9 (A) 02/17/2021    No OVs, Consults, or hospital visits since last care coordination call / Pharmacist visit. No medication changes indicated  Last adherence delivery date:  03/21/2021      Patient is due for next adherence delivery on: 04/19/2021  Spoke with patient on 04/08/2021 reviewed medications and coordinated delivery.  This delivery to include: Vials  30 Days   VIAL medications: Amlodipine 5 mg 1 tablet daily Pantoprazole   40 mg 1 tablet daily Ozempic '1mg'$  dose Pen inject into the skin once a week      Tadalafil 20 mg - 1/2 to 1 tablet daily as needed- PRN use Tresiba Flex 200 units- 28 units once daily at bedtime (patient states he is taking 24 units at bedtime) Test Strips One Touch Ultra - Use as directed 4 times daily        Novolog Flexpen Inject 10-18 units into the skin 3 times daily with meals  Patient declined the following medications this month: Olmesartan-HCTZ 40/25 mg 1 tablet daily - PCP sent 90 DS to [facility] on last fill. Patient states at least 30ds on hand. Simvastatin 20 mg 1 tablet daily - PCP sent 90 DS to [facility] on last fill. Patient states 30ds on hand Metformin 500 mg 1 tablet daily -  States he has a lot of metformin due to significant dose decrease in the past.  Any concerns about your medications? No - The patient reports he has concerns of being in the dounut hole soon and the expense of medication and supplies. He is not eligible for patient assistance.  How often do you forget or accidentally miss a dose? Rarely  Do you use a pillbox? No  Is patient in packaging No   No refill request needed from PCP.  Confirmed delivery date of 04/19/2021, advised patient that pharmacy will contact them the morning of delivery.  Recent blood pressure readings are as follows: The patient reports  he has not taken his blood pressure lately.  Recent blood glucose readings are as follows: Fasting:  04/04/21   159     04/06/21  300    04/08/21   137  Patient reports he is using the Dexcom system to check his BG.   Debbora Dus, CPP notified  Avel Sensor, [place] Assistant [phone]  I have reviewed the care management and care coordination activities outlined in this encounter and I am certifying that I agree with the content of this note. No further action required.  Debbora Dus, PharmD Clinical Pharmacist [facility] [phone]

## 2021-04-08 NOTE — Progress Notes (Signed)
Patient called back after speaking with Velmen[place] to inform that he needed Novolog today. States he only has 2 doses remaining. Acute form has been turned into [facility] for delivery today.   Debbora Dus, CPP notified  [name], [place] Pharmacy Assistant [phone]

## 2021-04-09 ENCOUNTER — Other Ambulatory Visit: Payer: Self-pay | Admitting: Internal Medicine

## 2021-04-11 ENCOUNTER — Other Ambulatory Visit: Payer: Self-pay | Admitting: *Deleted

## 2021-04-11 DIAGNOSIS — Z87891 Personal history of nicotine dependence: Secondary | ICD-10-CM

## 2021-04-14 ENCOUNTER — Ambulatory Visit: Payer: Medicare HMO | Admitting: Internal Medicine
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
## 2021-04-18 ENCOUNTER — Telehealth: Payer: Self-pay | Admitting: Internal Medicine
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
## 2021-04-18 NOTE — Telephone Encounter (Signed)
[place] from [facility] is requesting last OV progress notes. Has faxed over request

## 2021-04-19 ENCOUNTER — Ambulatory Visit (INDEPENDENT_AMBULATORY_CARE_PROVIDER_SITE_OTHER): Payer: Medicare HMO | Admitting: Internal Medicine

## 2021-04-19 ENCOUNTER — Other Ambulatory Visit: Payer: Self-pay

## 2021-04-19 ENCOUNTER — Encounter: Payer: Self-pay | Admitting: Internal Medicine

## 2021-04-19 VITALS — BP 140/84 | HR 74 | Ht 72.0 in | Wt 209.6 lb

## 2021-04-19 DIAGNOSIS — E785 Hyperlipidemia, unspecified: Secondary | ICD-10-CM

## 2021-04-19 DIAGNOSIS — E663 Overweight: Secondary | ICD-10-CM

## 2021-04-19 DIAGNOSIS — E113299 Type 2 diabetes mellitus with mild nonproliferative diabetic retinopathy without macular edema, unspecified eye: Secondary | ICD-10-CM | POA: Diagnosis not present

## 2021-04-19 DIAGNOSIS — Z794 Long term (current) use of insulin: Secondary | ICD-10-CM

## 2021-04-19 NOTE — Telephone Encounter (Signed)
Sent/faxed pt last OV to [facility] to [place].

## 2021-04-19 NOTE — Patient Instructions (Addendum)
Please continue: - Metformin ER 500 mg daily - Ozempic 1 mg weekly  Increase: - Tresiba 28 units daily  - Novolog 15 minutes before each meal: - 12 units before small meals - 15 units before regular meals - 18-20 units before larger meals     If the sugars are <90 before meals, take NovoLog at the start of the meal.  Set an alarm daily to take [name].  Please return in 3 months.

## 2021-04-23 ENCOUNTER — Telehealth: Payer: Self-pay

## 2021-04-23 NOTE — Telephone Encounter (Signed)
Request from [facility] requesting progress notes be faxed to [phone]. Form signed and faxed back with requested clinical notes.

## 2021-04-25 DIAGNOSIS — Z794 Long term (current) use of insulin: Secondary | ICD-10-CM | POA: Diagnosis not present

## 2021-04-25 DIAGNOSIS — E113291 Type 2 diabetes mellitus with mild nonproliferative diabetic retinopathy without macular edema, right eye: Secondary | ICD-10-CM | POA: Diagnosis not present

## 2021-04-28 ENCOUNTER — Ambulatory Visit (INDEPENDENT_AMBULATORY_CARE_PROVIDER_SITE_OTHER): Payer: Medicare HMO

## 2021-04-28 DIAGNOSIS — I10 Essential (primary) hypertension: Secondary | ICD-10-CM

## 2021-04-28 DIAGNOSIS — E785 Hyperlipidemia, unspecified: Secondary | ICD-10-CM

## 2021-04-28 DIAGNOSIS — E11649 Type 2 diabetes mellitus with hypoglycemia without coma: Secondary | ICD-10-CM | POA: Diagnosis not present

## 2021-04-28 DIAGNOSIS — Z794 Long term (current) use of insulin: Secondary | ICD-10-CM | POA: Diagnosis not present

## 2021-04-28 NOTE — Patient Instructions (Addendum)
Visit Information:  Thank you for taking the time to speak with me today.   PATIENT GOALS:  Goals Addressed             This Visit's Progress    Monitor and Manage My Blood Sugar-Diabetes Type 2 and Blood pressure - Hypertension   On track    Timeframe:  Long-Range Goal Priority:  High Start Date:   02/15/2021                          Expected End Date: 07/27/2021                  Follow Up Date 06/09/2021  - Continue to check blood sugar at prescribed times - Continue to check blood sugar if I feel it is too high or too low - take the blood sugar meter/ log to all doctor visits - Keep current/ updated medication list on your refrigerator for constant reminder -Consider having family member accompany you to your endocrinology appointments.  Consider having family assist  with medication reminders.  - Keep a snack with you to eat if you know you will be late for lunch.  Place yourself on a structured time for lunch (to help combat low blood sugar readings).  Continue to follow: RULE OF 15 How to treat low blood sugars (Blood sugar less than 70 mg/dl  Please follow the RULE OF 15 for the treatment of hypoglycemia treatment (When your blood sugars are less than 70 mg/ dl) STEP  1:  Take 15 grams of carbohydrates when your blood sugar is low, which includes:   3-4 glucose tabs or  3-4 oz of juice or regular soda or  One tube of glucose gel STEP 2:  Recheck blood sugar in 15 minutes STEP 3:  If your blood sugar is still low at the 15 minute recheck ---then, go back to STEP 1 and treat again with another 15 grams of carbohydrates - Continue to check blood pressure at least 1-2 times per week - Continue to take your medications as prescribed - Report new or ongoing symptoms to your provider as soon as possible.  - Keep your follow up appointments with your doctors.      Why is this important?   Checking your blood sugar at home helps to keep it from getting very high or very low.   Writing the results in a diary or log helps the doctor know how to care for you.  Your blood sugar log should have the time, date and the results.  Also, write down the amount of insulin or other medicine that you take.  Other information, like what you ate, exercise done and how you were feeling, will also be helpful.           The patient verbalized understanding of instructions, educational materials, and care plan provided today and agreed to receive a mailed copy of patient instructions, educational materials, and care plan.   The patient has been provided with contact information for the care management team and has been advised to call with any health related questions or concerns.  The care management team will reach out to the patient again over the next 45 days.   Quinn Plowman RN,BSN,CCM RN Case Manager [place]  [phone]

## 2021-04-28 NOTE — Chronic Care Management (AMB) (Signed)
Chronic Care Management   CCM RN Visit Note  04/28/2021 Name: Anthony [name] MRN: [id number] DOB: Apr 06, 1946  Subjective: Anthony [name] is a 75 y.o. year old male who is a primary care patient of [name], MD. The care management team was consulted for assistance with disease management and care coordination needs.    Engaged with patient by telephone for follow up visit in response to provider referral for case management and/or care coordination services.   Consent to Services:  The patient was given information about Chronic Care Management services, agreed to services, and gave verbal consent prior to initiation of services.  Please see initial visit note for detailed documentation.   Patient agreed to services and verbal consent obtained.   Assessment: Review of patient past medical history, allergies, medications, health status, including review of consultants reports, laboratory and other test data, was performed as part of comprehensive evaluation and provision of chronic care management services.   SDOH (Social Determinants of Health) assessments and interventions performed:    CCM Care Plan  No Known Allergies  Outpatient Encounter Medications as of 04/28/2021  Medication Sig   amLODipine (NORVASC) 5 MG tablet Take 1 tablet (5 mg total) by mouth daily.   b complex vitamins tablet Take 1 tablet by mouth daily.   Continuous Blood Gluc Transmit (DEXCOM G6 TRANSMITTER) MISC 1 Device by Does not apply route every 3 (three) months.   Dextrose, Diabetic Use, (GLUCOSE PO) Take 1 tablet by mouth as needed.   glucose blood (ONETOUCH ULTRA) test strip USE AS DIRECTED FOUR TIMES DAILY Dx code E11.65   GVOKE HYPOPEN 1-PACK 1 MG/0.2ML SOAJ Inject under skin 0.2 mL as needed for hypoglycemia   insulin degludec (TRESIBA FLEXTOUCH) 200 UNIT/ML FlexTouch Pen Inject under skin 28 units daily   Insulin Pen Needle (ADVOCATE INSULIN PEN NEEDLES) 31G X 5 MM MISC Use 3x a day    metFORMIN (GLUCOPHAGE-XR) 500 MG 24 hr tablet Take 1 tablet (500 mg total) by mouth daily with breakfast.   NOVOLOG FLEXPEN 100 UNIT/ML FlexPen INJECT 10-18 UNITS into THE SKIN THREE TIMES DAILY with meals   olmesartan-hydrochlorothiazide (BENICAR HCT) 40-25 MG tablet TAKE 1 TABLET BY MOUTH EVERY DAY   pantoprazole (PROTONIX) 40 MG tablet Take 1 tablet (40 mg total) by mouth daily.   Semaglutide, 1 MG/DOSE, (OZEMPIC, 1 MG/DOSE,) 4 MG/3ML SOPN Inject 1 mg into the skin once a week.   simvastatin (ZOCOR) 20 MG tablet TAKE 1 TABLET BY MOUTH EVERYDAY AT BEDTIME   tadalafil (CIALIS) 20 MG tablet TAKE 1/2 TABLET - ONE tab BY MOUTH EVERY OTHER DAY AS NEEDED FOR erectile dysfunction   No facility-administered encounter medications on file as of 04/28/2021.    Patient Active Problem List   Diagnosis Date Noted   Type 2 diabetes mellitus with hypoglycemia ([place]) 03/10/2021   Memory impairment 02/10/2021   Non-adherence to medical treatment 10/27/2020   COPD (chronic obstructive pulmonary disease) ([place]) 02/28/2020   Coronary artery calcification seen on CAT scan 02/28/2020   Aortic atherosclerosis ([place]) 02/28/2020   Ex-smoker 01/21/2020   RLQ abdominal pain 01/21/2020   Numbness and tingling in left arm 01/21/2020   Health maintenance examination 01/05/2017   Right knee pain 01/18/2016   Barrett's esophagus 12/08/2015   Abnormal tympanic membrane of right ear 07/05/2015   Advanced care planning/counseling discussion 06/30/2014   Medicare annual wellness visit, subsequent 06/22/2013   Benign prostatic hyperplasia with urinary obstruction 06/18/2013   ED (erectile dysfunction) 04/10/2012  Hyperlipidemia associated with type 2 diabetes mellitus ([place]) 12/29/2008   LEG PAIN, BILATERAL 11/19/2008   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 ([place]) 07/24/2007   ANXIETY 07/24/2007   GERD 07/24/2007    Conditions to be addressed/monitored:HTN and DMII  Care Plan :  Diabetes Type 2 (Adult)  Updates made by Dannielle Karvonen, RN since 04/28/2021 12:00 AM     Problem: Diabetes with hypertension management   Priority: High     Long-Range Goal: Glycemic/ blood pressure management   Start Date: 02/15/2021  Expected End Date: 07/27/2021  This Visit's Progress: On track  Recent Progress: On track  Priority: High  Note:   Objective:  Lab Results  Component Value Date   HGBA1C 6.9 (A) 02/17/2021   Lab Results  Component Value Date   CREATININE 1.08 03/08/2021   CREATININE 1.02 03/02/2020   CREATININE 0.91 01/12/2020  Last practice recorded BP readings:  BP Readings from Last 3 Encounters:  12/16/20 128/78  12/15/20 114/83  12/02/20 114/70  Current Barriers:  Patient states he continues to have blood sugars that are up and down.  He reports morning blood sugars are in the 200 range and evening blood sugars prior to bedtime are in the 150-160 range.   He states his blood sugars are elevated in the middle of the night around 300-400 range.  He reports his Dexcom alarms him if his blood sugar drops below 90 and his children are notified. Patient states he had a follow up appointment with his endocrinologist on 04/19/2021.  Patient instructions reviewed.  Knowledge Deficits related to basic Diabetes / Hypertension pathophysiology and self care/management Case Manager Clinical Goal(s):  patient will demonstrate improved adherence to prescribed treatment plan for diabetes self care/management as evidenced by: daily monitoring and recording of CBG  adherence to ADA/ carb modified diet adherence to prescribed medication regimen contacting provider for new or worsened symptoms or questions:   patient will verbalize understanding of plan for hypertension management patient will attend all scheduled medical appointments: patient will demonstrate improved adherence to prescribed treatment plan for hypertension as evidenced by taking all medications as prescribed,  monitoring and recording blood pressure as directed, adhering to low sodium/DASH diet Interventions:  Collaboration with [name], MD regarding development and update of comprehensive plan of care as evidenced by provider attestation and co-signature Inter-disciplinary care team collaboration (see longitudinal plan of care) Provided verbal education to patient about basic DM  and HTN disease process Reviewed medications with patient and discussed importance of medication adherence Discussed plans with patient for ongoing care management follow up and provided patient with direct contact information for care management team Reviewed  hypo and hyperglycemia management Reviewed scheduled/upcoming provider appointments:  Ophthalmology follow up 05/11/2021, Podiatry appointment 05/16/2021 Advised patient, providing education and rationale, to monitor blood pressure/ blood sugars daily and record, calling PCP for findings outside established parameters.  Self-Care Activities  Self administers oral and insulin medications as prescribed Attends all scheduled provider appointments Checks blood sugars as prescribed and utilize hyper and hypoglycemia protocol as needed Adheres to prescribed ADA/carb modified Attends all scheduled provider appointments Calls provider office for new concerns, questions, or BP outside discussed parameters Checks BP and records as discussed Follows a low sodium diet/DASH diet Patient Goals: - Continue to check blood sugar at prescribed times - Continue to check blood sugar if I feel it is too high or too low - take the blood sugar meter/ log to all doctor visits -  Keep current/ updated medication list on your refrigerator for constant reminder -Consider having family member accompany you to your endocrinology appointments.  Consider having family assist  with medication reminders.  - Keep a snack with you to eat if you know you will be late for lunch.  Place  yourself on a structured time for lunch (to help combat low blood sugar readings).  Continue to follow: RULE OF 15 How to treat low blood sugars (Blood sugar less than 70 mg/dl  Please follow the RULE OF 15 for the treatment of hypoglycemia treatment (When your blood sugars are less than 70 mg/ dl) STEP  1:  Take 15 grams of carbohydrates when your blood sugar is low, which includes:   3-4 glucose tabs or  3-4 oz of juice or regular soda or  One tube of glucose gel STEP 2:  Recheck blood sugar in 15 minutes STEP 3:  If your blood sugar is still low at the 15 minute recheck ---then, go back to STEP 1 and treat again with another 15 grams of carbohydrates - Continue to check blood pressure at least 1-2 times per week - Continue to take your medications as prescribed - Report new or ongoing symptoms to your provider as soon as possible.  - Keep your follow up appointments with your doctors.  Follow Up Plan: The patient has been provided with contact information for the care management team and has been advised to call with any health related questions or concerns.  The care management team will reach out to the patient again over the next 30 days.        Plan:The patient has been provided with contact information for the care management team and has been advised to call with any health related questions or concerns.  and The care management team will reach out to the patient again over the next 45 days. Quinn Plowman RN,BSN,CCM RN Case Manager [place]  [phone]

## 2021-05-03 ENCOUNTER — Other Ambulatory Visit: Payer: Self-pay | Admitting: Family Medicine

## 2021-05-10 ENCOUNTER — Telehealth: Payer: Self-pay

## 2021-05-10 NOTE — Chronic Care Management (AMB) (Addendum)
Chronic Care Management Pharmacy Assistant   Name: Anthony [name]  MRN: [id number] DOB: June 02, 1946   Reason for Encounter: Medication Adherence and Delivery Coordination    Recent office visits:  None since last CCM contact  Recent consult visits:  04/19/21-Endocrinology-Patient presented for diabetes follow up. I also advised him to think about the V-Go pump  Increase: - Tresiba 28 units daily  - Novolog 15 minutes before each meal: - 12 units before small meals - 15 units before regular meals - 18-20 units before larger meals   Advised to check sugars different times X4,follow up 3-4 months Restart Ozempic , if not already    Hospital visits:  None in previous 6 months  Medications: Outpatient Encounter Medications as of 05/10/2021  Medication Sig   amLODipine (NORVASC) 5 MG tablet Take 1 tablet (5 mg total) by mouth daily.   b complex vitamins tablet Take 1 tablet by mouth daily.   Continuous Blood Gluc Transmit (DEXCOM G6 TRANSMITTER) MISC 1 Device by Does not apply route every 3 (three) months.   Dextrose, Diabetic Use, (GLUCOSE PO) Take 1 tablet by mouth as needed.   glucose blood (ONETOUCH ULTRA) test strip USE AS DIRECTED FOUR TIMES DAILY Dx code E11.65   GVOKE HYPOPEN 1-PACK 1 MG/0.2ML SOAJ Inject under skin 0.2 mL as needed for hypoglycemia   insulin degludec (TRESIBA FLEXTOUCH) 200 UNIT/ML FlexTouch Pen Inject under skin 28 units daily   Insulin Pen Needle (ADVOCATE INSULIN PEN NEEDLES) 31G X 5 MM MISC Use 3x a day   metFORMIN (GLUCOPHAGE-XR) 500 MG 24 hr tablet Take 1 tablet (500 mg total) by mouth daily with breakfast.   NOVOLOG FLEXPEN 100 UNIT/ML FlexPen INJECT 10-18 UNITS into THE SKIN THREE TIMES DAILY with meals   olmesartan-hydrochlorothiazide (BENICAR HCT) 40-25 MG tablet TAKE 1 TABLET BY MOUTH EVERY DAY   pantoprazole (PROTONIX) 40 MG tablet Take 1 tablet (40 mg total) by mouth daily.   Semaglutide, 1 MG/DOSE, (OZEMPIC, 1 MG/DOSE,) 4 MG/3ML SOPN  Inject 1 mg into the skin once a week.   simvastatin (ZOCOR) 20 MG tablet TAKE 1 TABLET BY MOUTH EVERYDAY AT BEDTIME   tadalafil (CIALIS) 20 MG tablet TAKE 1/2 TABLET - ONE tab BY MOUTH EVERY OTHER DAY AS NEEDED FOR erectile dysfunction   No facility-administered encounter medications on file as of 05/10/2021.   BP Readings from Last 3 Encounters:  04/19/21 140/84  03/08/21 116/72  02/17/21 (!) 148/80    Lab Results  Component Value Date   HGBA1C 6.9 (A) 02/17/2021      Recent OV, Consult or Hospital visit:  04/19/21 Endocrinology- Increase: - Anthony [name] 28 units daily  - Novolog 15 minutes before each meal: - 12 units before small meals - 15 units before regular meals - 18-20 units before larger meals   Last adherence delivery date:04/19/21      Patient is due for next adherence delivery on: 05/19/21  Spoke with patient on 05/11/21 reviewed medications and coordinated delivery.  The patient was out of town during call.  This delivery to include: Vials  30 Days  VIAL medications: Amlodipine 5 mg 1 tablet daily Pantoprazole   40 mg 1 tablet daily Ozempic '1mg'$  dose Pen inject into the skin once a week      Tadalafil 20 mg - 1/2 to 1 tablet daily as needed- PRN use Tresiba Flex 200 units- 32 units once daily at bedtime (patient states he is taking 24 units at bedtime) Test Strips One  Touch Ultra - Use as directed 4 times daily        Novolog Flexpen Inject 10-18 units into the skin 3 times daily with meals   Patient declined the following medications this month: Metformin XR 500 (patient has supply due to decrease in dose) Simvastatin '20mg'$  [facility] 03/11[facility]21 90 DS Olmesartan-hctz 40/25 filled [facility] 03/11/21 90 DS  Any concerns about your medications? The patient is concerned about cost and donut hole. Unfortunately, he is not eligible for coupons and has declined manufacturer assistance.  How often do you forget or accidentally miss a dose? Rarely  Do you use a pillbox? No  Refill  request needed: amlodipine, pantoprazole, Tresiba  Confirmed delivery date of 05/19/21, advised patient that pharmacy will contact them the morning of delivery.  Recent blood glucose readings are as follows: Fasting:  patient reports running 140s  After Meals:  after lunch today  142  Annual wellness visit in last year? 03/08/2021 Most Recent BP reading: 140/84 74-P  If Diabetic: Most recent A1C reading: 6.9 (02/17/21) Last eye exam / retinopathy screening:12/2020 Last diabetic foot exam:10/12/20 Debbora Dus, CPP notified  Avel Sensor, [place] Assistant [phone]  I have reviewed the care management and care coordination activities outlined in this encounter and I am certifying that I agree with the content of this note. No further action required.  Debbora Dus, PharmD Clinical Pharmacist [facility] at [facility] [phone]

## 2021-05-11 ENCOUNTER — Ambulatory Visit: Payer: Medicare HMO | Admitting: Internal Medicine

## 2021-05-13 ENCOUNTER — Other Ambulatory Visit: Payer: Self-pay | Admitting: Family Medicine

## 2021-05-13 ENCOUNTER — Other Ambulatory Visit: Payer: Self-pay | Admitting: Internal Medicine

## 2021-05-16 ENCOUNTER — Ambulatory Visit: Payer: Medicare HMO | Admitting: Podiatry

## 2021-05-16 ENCOUNTER — Telehealth: Payer: Self-pay

## 2021-05-16 NOTE — Chronic Care Management (AMB) (Addendum)
    Chronic Care Management Pharmacy Assistant   Name: Anthony [name]  MRN: [id number] DOB: 05-12-1946   Reason for Encounter: CCM Reminder Call   Conditions to be addressed/monitored: HLD, COPD, and DMII   Medications: Outpatient Encounter Medications as of 05/16/2021  Medication Sig   amLODipine (NORVASC) 5 MG tablet TAKE ONE TABLET BY MOUTH ONCE DAILY   b complex vitamins tablet Take 1 tablet by mouth daily.   Continuous Blood Gluc Transmit (DEXCOM G6 TRANSMITTER) MISC 1 Device by Does not apply route every 3 (three) months.   Dextrose, Diabetic Use, (GLUCOSE PO) Take 1 tablet by mouth as needed.   glucose blood (ONETOUCH ULTRA) test strip USE AS DIRECTED FOUR TIMES DAILY Dx code E11.65   GVOKE HYPOPEN 1-PACK 1 MG/0.2ML SOAJ Inject under skin 0.2 mL as needed for hypoglycemia   insulin degludec (TRESIBA FLEXTOUCH) 200 UNIT/ML FlexTouch Pen INJECT 32U into SUBCUTANEOUSLY DAILY   Insulin Pen Needle (ADVOCATE INSULIN PEN NEEDLES) 31G X 5 MM MISC Use 3x a day   metFORMIN (GLUCOPHAGE-XR) 500 MG 24 hr tablet Take 1 tablet (500 mg total) by mouth daily with breakfast.   NOVOLOG FLEXPEN 100 UNIT/ML FlexPen INJECT 10-18 UNITS into THE SKIN THREE TIMES DAILY with meals   olmesartan-hydrochlorothiazide (BENICAR HCT) 40-25 MG tablet TAKE 1 TABLET BY MOUTH EVERY DAY   pantoprazole (PROTONIX) 40 MG tablet TAKE ONE TABLET BY MOUTH ONCE DAILY   Semaglutide, 1 MG/DOSE, (OZEMPIC, 1 MG/DOSE,) 4 MG/3ML SOPN Inject 1 mg into the skin once a week.   simvastatin (ZOCOR) 20 MG tablet TAKE 1 TABLET BY MOUTH EVERYDAY AT BEDTIME   tadalafil (CIALIS) 20 MG tablet TAKE 1/2 TABLET - ONE tab BY MOUTH EVERY OTHER DAY AS NEEDED FOR erectile dysfunction   No facility-administered encounter medications on file as of 05/16/2021.   [name] was contacted to remind him of his upcoming telephone visit with Debbora Dus on 05/23/21 at 9:00am. Patient was reminded to have all medications, supplements and any  blood glucose and blood pressure readings available for review at appointment.   Are you having any problems with your medications? No  Do you have any concerns you like to discuss with the pharmacist? No  Star Rating Drugs: Medication:  Last Fill: Day Supply Tresiba  05/16/21 37 Metformin XR '500mg'$  - -  (patient has supply due to     decrease in dose) Novolog   05/13/21 28 Olmesartan   03/11/21 90 Ozempic   05/13/21 30 Simvastatin '20mg'$   03/11/21 [place], CPP notified  Avel Sensor, [place] Assistant [phone]  I have reviewed the care management and care coordination activities outlined in this encounter and I am certifying that I agree with the content of this note. No further action required.  Debbora Dus, PharmD Clinical Pharmacist [facility] at [facility] [phone]

## 2021-05-23 ENCOUNTER — Telehealth: Payer: Medicare HMO

## 2021-05-23 ENCOUNTER — Telehealth: Payer: Self-pay

## 2021-05-23 NOTE — Chronic Care Management (AMB) (Addendum)
Chronic Care Management Pharmacy Assistant   Name: Anthony [name]  MRN: [id number] DOB: 16-May-1946  Reason for Encounter: Diabetes Disease State    Recent office visits:  None since last CCM contact  Recent consult visits:  None since last CCM contact  Hospital visits:  None in previous 6 months  Medications: Outpatient Encounter Medications as of 05/23/2021  Medication Sig   amLODipine (NORVASC) 5 MG tablet TAKE ONE TABLET BY MOUTH ONCE DAILY   b complex vitamins tablet Take 1 tablet by mouth daily.   Continuous Blood Gluc Transmit (DEXCOM G6 TRANSMITTER) MISC 1 Device by Does not apply route every 3 (three) months.   Dextrose, Diabetic Use, (GLUCOSE PO) Take 1 tablet by mouth as needed.   glucose blood (ONETOUCH ULTRA) test strip USE AS DIRECTED FOUR TIMES DAILY Dx code E11.65   GVOKE HYPOPEN 1-PACK 1 MG/0.2ML SOAJ Inject under skin 0.2 mL as needed for hypoglycemia   insulin degludec (TRESIBA FLEXTOUCH) 200 UNIT/ML FlexTouch Pen INJECT 32U into SUBCUTANEOUSLY DAILY   Insulin Pen Needle (ADVOCATE INSULIN PEN NEEDLES) 31G X 5 MM MISC Use 3x a day   metFORMIN (GLUCOPHAGE-XR) 500 MG 24 hr tablet Take 1 tablet (500 mg total) by mouth daily with breakfast.   NOVOLOG FLEXPEN 100 UNIT/ML FlexPen INJECT 10-18 UNITS into THE SKIN THREE TIMES DAILY with meals   olmesartan-hydrochlorothiazide (BENICAR HCT) 40-25 MG tablet TAKE 1 TABLET BY MOUTH EVERY DAY   pantoprazole (PROTONIX) 40 MG tablet TAKE ONE TABLET BY MOUTH ONCE DAILY   Semaglutide, 1 MG/DOSE, (OZEMPIC, 1 MG/DOSE,) 4 MG/3ML SOPN Inject 1 mg into the skin once a week.   simvastatin (ZOCOR) 20 MG tablet TAKE 1 TABLET BY MOUTH EVERYDAY AT BEDTIME   tadalafil (CIALIS) 20 MG tablet TAKE 1/2 TABLET - ONE tab BY MOUTH EVERY OTHER DAY AS NEEDED FOR erectile dysfunction   No facility-administered encounter medications on file as of 05/23/2021.     Recent Relevant Labs: Lab Results  Component Value Date/Time   HGBA1C 6.9 (A)  02/17/2021 04:22 PM   HGBA1C 8.0 (A) 10/27/2020 10:56 AM   HGBA1C 7.7 (H) 03/02/2020 10:26 AM   HGBA1C 7.8 (H) 01/12/2020 10:21 AM   MICROALBUR 6.3 (H) 06/28/2015 09:39 AM   MICROALBUR 4.4 (H) 06/26/2014 09:05 AM    Kidney Function Lab Results  Component Value Date/Time   CREATININE 1.08 03/08/2021 03:08 PM   CREATININE 1.02 03/02/2020 10:26 AM   GFR 68.45 03/08/2021 03:08 PM   GFRNONAA >60 03/02/2020 10:26 AM   GFRAA >60 03/02/2020 10:26 AM     Attempted contact with Anthony [name] 3 times on 05/23/21,05/26/21,05/27/21 Unsuccessful outreach. Will attempt contact next month.   Current antihyperglycemic regimen:  Metformin XR 500 mg - 1 tablet daily before breakfast Insulin aspart (NovoLog) - 08-11-15 units, 15 minutes before meals (pt taking differently: 14 units when BG < 250) Tresiba - 30 units once daily at bedtime (pt taking differently: 24 units at bedtime) Ozempic 1 mg - Inject once weekly on Mondays   Adherence Review: Is the patient currently on a STATIN medication? Yes Is the patient currently on ACE/ARB medication? Yes Does the patient have >5 day gap between last estimated fill dates? No  Care Gaps: Annual wellness visit in last year? Yes   03/08/21 Most recent A1C reading:  6.9  02/17/21 Most Recent BP reading:  140/84  74-P  Last eye exam / retinopathy screening:12/2020 Last diabetic foot exam: 10/12/20 Counseled patient on importance of annual  eye and foot exam.   Star Rating Drugs:  Medication:  Last Fill: Day Supply Simvastatin 20mg  03/11/21 90 Ozempic 1mg   05/13/21 30 Olmesartan 40-25mg  03/11/21 90 Metformin XR 500mg    12/02/20 - Anthony [name] 200unit 05/16/21 37   PCP appointment on 06/08/21, CCM appointment on 06/09/21, Endocrinology appointment on 07/26/21, and Cardiology appointment on 08/08/21  Anthony [name], CPP notified  Anthony [name], [place] Assistant [phone]  I have reviewed the care management and care coordination activities  outlined in this encounter and I am certifying that I agree with the content of this note. No further action required.  Anthony [name], PharmD Clinical Pharmacist [place] at [facility] [phone]

## 2021-05-25 DIAGNOSIS — E113291 Type 2 diabetes mellitus with mild nonproliferative diabetic retinopathy without macular edema, right eye: Secondary | ICD-10-CM | POA: Diagnosis not present

## 2021-05-25 DIAGNOSIS — Z794 Long term (current) use of insulin: Secondary | ICD-10-CM | POA: Diagnosis not present

## 2021-05-30 ENCOUNTER — Telehealth: Payer: Self-pay

## 2021-05-30 NOTE — Progress Notes (Addendum)
Chronic Care Management Pharmacy Assistant   Name: Anthony [name]  MRN: [id number] DOB: 01/13/1946   Reason for Encounter: Diabetes Disease State   Recent office visits:  None since last CCM contact  Recent consult visits:  None since last CCM contact  Hospital visits:  None since last CCM contact  Medications: Outpatient Encounter Medications as of 05/30/2021  Medication Sig   amLODipine (NORVASC) 5 MG tablet TAKE ONE TABLET BY MOUTH ONCE DAILY   b complex vitamins tablet Take 1 tablet by mouth daily.   Continuous Blood Gluc Transmit (DEXCOM G6 TRANSMITTER) MISC 1 Device by Does not apply route every 3 (three) months.   Dextrose, Diabetic Use, (GLUCOSE PO) Take 1 tablet by mouth as needed.   glucose blood (ONETOUCH ULTRA) test strip USE AS DIRECTED FOUR TIMES DAILY Dx code E11.65   GVOKE HYPOPEN 1-PACK 1 MG/0.2ML SOAJ Inject under skin 0.2 mL as needed for hypoglycemia   insulin degludec (TRESIBA FLEXTOUCH) 200 UNIT/ML FlexTouch Pen INJECT 32U into SUBCUTANEOUSLY DAILY   Insulin Pen Needle (ADVOCATE INSULIN PEN NEEDLES) 31G X 5 MM MISC Use 3x a day   metFORMIN (GLUCOPHAGE-XR) 500 MG 24 hr tablet Take 1 tablet (500 mg total) by mouth daily with breakfast.   NOVOLOG FLEXPEN 100 UNIT/ML FlexPen INJECT 10-18 UNITS into THE SKIN THREE TIMES DAILY with meals   olmesartan-hydrochlorothiazide (BENICAR HCT) 40-25 MG tablet TAKE 1 TABLET BY MOUTH EVERY DAY   pantoprazole (PROTONIX) 40 MG tablet TAKE ONE TABLET BY MOUTH ONCE DAILY   Semaglutide, 1 MG/DOSE, (OZEMPIC, 1 MG/DOSE,) 4 MG/3ML SOPN Inject 1 mg into the skin once a week.   simvastatin (ZOCOR) 20 MG tablet TAKE 1 TABLET BY MOUTH EVERYDAY AT BEDTIME   tadalafil (CIALIS) 20 MG tablet TAKE 1/2 TABLET - ONE tab BY MOUTH EVERY OTHER DAY AS NEEDED FOR erectile dysfunction   No facility-administered encounter medications on file as of 05/30/2021.    Recent Relevant Labs: Lab Results  Component Value Date/Time   HGBA1C 6.9 (A)  02/17/2021 04:22 PM   HGBA1C 8.0 (A) 10/27/2020 10:56 AM   HGBA1C 7.7 (H) 03/02/2020 10:26 AM   HGBA1C 7.8 (H) 01/12/2020 10:21 AM   MICROALBUR 6.3 (H) 06/28/2015 09:39 AM   MICROALBUR 4.4 (H) 06/26/2014 09:05 AM    Kidney Function Lab Results  Component Value Date/Time   CREATININE 1.08 03/08/2021 03:08 PM   CREATININE 1.02 03/02/2020 10:26 AM   GFR 68.45 03/08/2021 03:08 PM   GFRNONAA >60 03/02/2020 10:26 AM   GFRAA >60 03/02/2020 10:26 AM   Contacted patient on 06/08/2021 to discuss diabetes disease state.   Current antihyperglycemic regimen:  Metformin XR 500 mg - 1 tablet daily before breakfast Insulin aspart (NovoLog) - 08-11-15 units, 15 minutes before meals  Tresiba - 30 units once daily at bedtime Ozempic 1 mg - Inject once weekly on Mondays   Patient verbally confirms he is taking the above medications as directed. Yes  What diet changes have been made to improve diabetes control? No diet changes.   What recent interventions/DTPs have been made to improve glycemic control:  Patient was advised to check sugars at different times of the day - 4x a day, rotating check times  Have there been any recent hospitalizations or ED visits since last visit with CPP? No  Patient denies hypoglycemic symptoms, including Pale, Sweaty, Shaky, Hungry, Nervous/irritable, and Vision changes  Patient denies hyperglycemic symptoms, including blurry vision, excessive thirst, fatigue, polyuria, and weakness  How often  are you checking your blood sugar? once daily Patient is using Dexcom which has been very helpful for him. He does not write it down due to the Dexom. Typical numbers are 120.   During the week, how often does your blood glucose drop below 70? Never  Are you checking your feet daily/regularly? Yes  Adherence Review: Is the patient currently on a STATIN medication? Yes Is the patient currently on ACE/ARB medication? Yes Does the patient have >5 day gap between last  estimated fill dates? No  Care Gaps: Annual wellness visit in last year? Yes 03/08/2021 Most recent A1C reading: 6.9 on 02/17/2021 Most Recent BP reading: 140/84   Last eye exam / retinopathy screening: 12/2020 Last diabetic foot exam:10/12/2020  Counseled patient on importance of annual eye and foot exam.   Star Rating Drugs:  Medication:  Last Fill: Day Supply Simvastatin 20mg  03/11/2021      90 Ozempic 1mg   05/13/2021      30 Olmesartan 40-25mg  03/11/2021      90 Metformin XR 500mg  12/02/2020        Patient verified April fill date for Metformin; states he got sent 5 giant bottles so he still has plenty.  [name] 200unit           05/16/2021      39  Foot Care appointment on 09/07/2020 PCP appointment on 06/08/2021 CCM RN on 06/09/2021  Patient has a birthday coming up. I wished him a happy birthday. He told me the whole entire family is going to [employer] in [place].   Debbora Dus, CPP notified  [name], [place] Clinical Pharmacy Assistant [phone]  I have reviewed the care management and care coordination activities outlined in this encounter and I am certifying that I agree with the content of this note. No further action required.  Debbora Dus, PharmD Clinical Pharmacist [facility] at [facility] [phone]

## 2021-06-06 ENCOUNTER — Telehealth: Payer: Self-pay | Admitting: *Deleted

## 2021-06-06 NOTE — Chronic Care Management (AMB) (Signed)
  Care Management   Note  06/06/2021 Name: Anthony [name] MRN: [id number] DOB: 07/23/1946  Anthony [name] is a 75 y.o. year old male who is a primary care patient of [name], MD and is actively engaged with the care management team. I reached out to Anthony [name] by phone today to assist with re-scheduling a follow up visit with the RN Case Manager  Follow up plan: Unsuccessful telephone outreach attempt made. A HIPAA compliant phone message was left for the patient providing contact information and requesting a return call.    Telephone appointment with care management team member scheduled for: 06/14/2021  Julian Hy, [place] Management  Direct Dial: [phone]

## 2021-06-07 ENCOUNTER — Ambulatory Visit: Payer: Medicare HMO | Admitting: Podiatry

## 2021-06-07 ENCOUNTER — Other Ambulatory Visit: Payer: Self-pay

## 2021-06-07 ENCOUNTER — Encounter: Payer: Self-pay | Admitting: Podiatry

## 2021-06-07 ENCOUNTER — Telehealth: Payer: Self-pay

## 2021-06-07 DIAGNOSIS — M79675 Pain in left toe(s): Secondary | ICD-10-CM | POA: Diagnosis not present

## 2021-06-07 DIAGNOSIS — B351 Tinea unguium: Secondary | ICD-10-CM

## 2021-06-07 DIAGNOSIS — M79674 Pain in right toe(s): Secondary | ICD-10-CM

## 2021-06-07 DIAGNOSIS — E1142 Type 2 diabetes mellitus with diabetic polyneuropathy: Secondary | ICD-10-CM | POA: Diagnosis not present

## 2021-06-07 DIAGNOSIS — L84 Corns and callosities: Secondary | ICD-10-CM

## 2021-06-07 NOTE — Chronic Care Management (AMB) (Addendum)
Chronic Care Management Pharmacy Assistant   Name: Anthony [name]  MRN: [id number] DOB: 23-Oct-1945  Reason for Encounter: Medication Adherence and Delivery Coordination   Recent office visits:  None since lat CCM contact  Recent consult visits:  None since last CCM contact  Hospital visits:  None in previous 6 months  Medications: Outpatient Encounter Medications as of 06/07/2021  Medication Sig   amLODipine (NORVASC) 5 MG tablet TAKE ONE TABLET BY MOUTH ONCE DAILY   b complex vitamins tablet Take 1 tablet by mouth daily.   Continuous Blood Gluc Transmit (DEXCOM G6 TRANSMITTER) MISC 1 Device by Does not apply route every 3 (three) months.   Dextrose, Diabetic Use, (GLUCOSE PO) Take 1 tablet by mouth as needed.   glucose blood (ONETOUCH ULTRA) test strip USE AS DIRECTED FOUR TIMES DAILY Dx code E11.65   GVOKE HYPOPEN 1-PACK 1 MG/0.2ML SOAJ Inject under skin 0.2 mL as needed for hypoglycemia   insulin degludec (TRESIBA FLEXTOUCH) 200 UNIT/ML FlexTouch Pen INJECT 32U into SUBCUTANEOUSLY DAILY   Insulin Pen Needle (ADVOCATE INSULIN PEN NEEDLES) 31G X 5 MM MISC Use 3x a day   metFORMIN (GLUCOPHAGE-XR) 500 MG 24 hr tablet Take 1 tablet (500 mg total) by mouth daily with breakfast.   NOVOLOG FLEXPEN 100 UNIT/ML FlexPen INJECT 10-18 UNITS into THE SKIN THREE TIMES DAILY with meals   olmesartan-hydrochlorothiazide (BENICAR HCT) 40-25 MG tablet TAKE 1 TABLET BY MOUTH EVERY DAY   pantoprazole (PROTONIX) 40 MG tablet TAKE ONE TABLET BY MOUTH ONCE DAILY   Semaglutide, 1 MG/DOSE, (OZEMPIC, 1 MG/DOSE,) 4 MG/3ML SOPN Inject 1 mg into the skin once a week.   simvastatin (ZOCOR) 20 MG tablet TAKE 1 TABLET BY MOUTH EVERYDAY AT BEDTIME   tadalafil (CIALIS) 20 MG tablet TAKE 1/2 TABLET - ONE tab BY MOUTH EVERY OTHER DAY AS NEEDED FOR erectile dysfunction   No facility-administered encounter medications on file as of 06/07/2021.    BP Readings from Last 3 Encounters:  04/19/21 140/84   03/08/21 116/72  02/17/21 (!) 148/80    Lab Results  Component Value Date   HGBA1C 6.9 (A) 02/17/2021      No OVs, Consults, or hospital visits since last care coordination call / Pharmacist visit. No medication changes indicated   Last adherence delivery date:05/19/21      Patient is due for next adherence delivery on: 06/17/21  Spoke with patient on 06/07/21 reviewed medications and coordinated delivery.  This delivery to include: Vials  30 Days VIAL medications: Amlodipine 5 mg 1 tablet daily Pantoprazole   40 mg 1 tablet daily Ozempic 1mg  dose Pen inject into the skin once a week      Tadalafil 20 mg - 1/2 to 1 tablet daily as needed- PRN use Novolog Flexpen Inject 10-18 units into the skin 3 times daily with meals Tresiba Flex 200 units- 32 units once daily at bedtime (patient states he is taking 24 units at bedtime) Onetouch ultra test strips   Patient declined the following medications this month: Metformin XR 500 (patient has supply due to decrease in dose) Simvastatin 20mg  (patient states at least 30 day supply remaining, despite refill history filled 03/11/21 90 DS - he should be out soon) Olmesartan-HCTZ 40/25 (patient states at least 30 day supply remaining, despite refill history filled 03/11/21 90 DS - he should be out soon)  Any concerns about your medications? No  How often do you forget or accidentally miss a dose? Never The patient confirmed he  has not missed any doses of medications. He takes all oral medications in the morning.  Do you use a pillbox? No  No refill request needed.  Confirmed delivery date of 06/17/21, advised patient that pharmacy will contact them the morning of delivery.  Annual wellness visit in last year? Yes  03/08/21 Most Recent BP reading: 140/84  74-P  04/19/21  If Diabetic: Most recent A1C reading:  6.9  02/17/21 Last eye exam / retinopathy screening: 09/2020 Last diabetic foot exam: 06/2020  Debbora Dus, CPP  notified  Avel Sensor, [place] Assistant [phone]  I have reviewed the care management and care coordination activities outlined in this encounter and I am certifying that I agree with the content of this note. Advised Kiley Torrence to discuss option of pill packs for patient to improve adherence (statin, ACE, metformin). He politely declines need at this time. State he takes all meds in the morning. No further action required.  Debbora Dus, PharmD Clinical Pharmacist [facility] at [facility] [phone]

## 2021-06-08 ENCOUNTER — Ambulatory Visit: Payer: Medicare HMO | Admitting: Family Medicine

## 2021-06-09 ENCOUNTER — Telehealth: Payer: Medicare HMO

## 2021-06-10 ENCOUNTER — Other Ambulatory Visit: Payer: Self-pay | Admitting: Family Medicine

## 2021-06-11 NOTE — Progress Notes (Signed)
Subjective: Anthony [name] presents today for at risk foot care with history of diabetic neuropathy and callus(es)  b/l and painful thick toenails that are difficult to trim. Painful toenails interfere with ambulation. Aggravating factors include wearing enclosed shoe gear. Pain is relieved with periodic professional debridement. Painful calluses are aggravated when weightbearing with and without shoegear. Pain is relieved with periodic professional debridement.[name], MD is patient's PCP. Last visit was 03/08/2021. He sees [name] in Endocrinology and last visit was 04/19/2021.  He voices no new pedal problems on today's visit.  Past Medical History:  Diagnosis Date   Anxiety    denies   Arthritis    a little   Barrett's esophagus 12/08/2015   By EGD 2010 [name])    BPH with obstruction/lower urinary tract symptoms 2014   s/p TURP   Depression    denies   DKA (diabetic ketoacidoses) 01/2010   "in coma"   GERD (gastroesophageal reflux disease)    H/O bronchitis    after every cold   HTN (hypertension)    Hyperlipidemia    controlled with medicine   Otitis media    Rheumatic fever    maybe as child   Type 2 diabetes, uncontrolled, with mild 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 macular edema    completed DMSE    Patient Active Problem List   Diagnosis Date Noted   Type 2 diabetes mellitus with hypoglycemia ([place]) 03/10/2021   Memory impairment 02/10/2021   Non-adherence to medical treatment 10/27/2020   COPD (chronic obstructive pulmonary disease) ([place]) 02/28/2020   Coronary artery calcification seen on CAT scan 02/28/2020   Aortic atherosclerosis ([place]) 02/28/2020   Ex-smoker 01/21/2020   RLQ abdominal pain 01/21/2020   Numbness and tingling in left arm 01/21/2020   Health maintenance examination 01/05/2017   Right knee pain 01/18/2016   Barrett's esophagus 12/08/2015   Abnormal tympanic membrane of right ear 07/05/2015   Advanced care  planning/counseling discussion 06/30/2014   Medicare annual wellness visit, subsequent 06/22/2013   Benign prostatic hyperplasia with urinary obstruction 06/18/2013   ED (erectile dysfunction) 04/10/2012   Hyperlipidemia associated with type 2 diabetes mellitus ([place]) 12/29/2008   LEG PAIN, BILATERAL 11/19/2008   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 ([place]) 07/24/2007   ANXIETY 07/24/2007   GERD 07/24/2007    Past Surgical History:  Procedure Laterality Date   ABI  12/2013   WNL   APPENDECTOMY     CATARACT EXTRACTION W/PHACO Right 12/01/2020   Procedure: CATARACT EXTRACTION PHACO AND INTRAOCULAR LENS PLACEMENT ([place])  RIGHT VIVITY TORIC LENS DIABETIC;  Surgeon: Leandrew Koyanagi, MD;  Location: [place];  Service: Ophthalmology;  Laterality: Right;  6.93 1:12.8 9.5%   CATARACT EXTRACTION W/PHACO Left 12/15/2020   Procedure: CATARACT EXTRACTION PHACO AND INTRAOCULAR LENS PLACEMENT ([place])  VIVITY TORIC LENS DIABETIC LEFT EYE;  Surgeon: Leandrew Koyanagi, MD;  Location: [place];  Service: Ophthalmology;  Laterality: Left;  3.47 1:03.8 5.5%   CHOLECYSTECTOMY     years ago   COLONOSCOPY  01/2009   TA, rec rpt 5 yrs [name])   COLONOSCOPY  01/2018   TAx2, diverticulosis, int hemorrhoids, rpt 5 yrs ([name])   ESOPHAGOGASTRODUODENOSCOPY  01/2009   biopsy with Barrett's   ESOPHAGOGASTRODUODENOSCOPY  01/2018   WNL - no barrett's ([name])   INGUINAL HERNIA REPAIR Bilateral 03/04/2020   Procedure: LAPAROSCOPIC BILATERAL INGUINAL HERNIA REPAIR WITH MESH;  Surgeon: [name], MD;  Location: [place];  Service: General;  Laterality: Bilateral;   LAPAROSCOPIC APPENDECTOMY  07/05/2011   Procedure: APPENDECTOMY LAPAROSCOPIC;  Surgeon: [name], MD;  Location: [place];  Service: General;  Laterality: N/A;   MENISCUS REPAIR Right 2018   [name] @ [facility]   TRANSURETHRAL RESECTION OF PROSTATE N/A 07/28/2013    Procedure: TRANSURETHRAL RESECTION OF THE PROSTATE WITH GYRUS INSTRUMENTS;  Surgeon: [name], MD   VASECTOMY     WISDOM TOOTH EXTRACTION      Current Outpatient Medications on File Prior to Visit  Medication Sig Dispense Refill   amLODipine (NORVASC) 5 MG tablet TAKE ONE TABLET BY MOUTH ONCE DAILY 90 tablet 0   b complex vitamins tablet Take 1 tablet by mouth daily.     Continuous Blood Gluc Transmit (DEXCOM G6 TRANSMITTER) MISC 1 Device by Does not apply route every 3 (three) months. 1 each 3   Dextrose, Diabetic Use, (GLUCOSE PO) Take 1 tablet by mouth as needed.     glucose blood (ONETOUCH ULTRA) test strip USE AS DIRECTED FOUR TIMES DAILY Dx code E11.65 150 strip 3   GVOKE HYPOPEN 1-PACK 1 MG/0.2ML SOAJ Inject under skin 0.2 mL as needed for hypoglycemia 0.2 mL 11   insulin degludec (TRESIBA FLEXTOUCH) 200 UNIT/ML FlexTouch Pen INJECT 32U into SUBCUTANEOUSLY DAILY 27 mL 2   Insulin Pen Needle (ADVOCATE INSULIN PEN NEEDLES) 31G X 5 MM MISC Use 3x a day 300 each 3   metFORMIN (GLUCOPHAGE-XR) 500 MG 24 hr tablet Take 1 tablet (500 mg total) by mouth daily with breakfast. 360 tablet 0   NOVOLOG FLEXPEN 100 UNIT/ML FlexPen INJECT 10-18 UNITS into THE SKIN THREE TIMES DAILY with meals 60 mL 0   pantoprazole (PROTONIX) 40 MG tablet TAKE ONE TABLET BY MOUTH ONCE DAILY 90 tablet 0   Semaglutide, 1 MG/DOSE, (OZEMPIC, 1 MG/DOSE,) 4 MG/3ML SOPN Inject 1 mg into the skin once a week. 9 mL 3   tadalafil (CIALIS) 20 MG tablet TAKE 1/2 TABLET - ONE tab BY MOUTH EVERY OTHER DAY AS NEEDED FOR erectile dysfunction 5 tablet 5   No current facility-administered medications on file prior to visit.     No Known Allergies  Social History   Occupational History   Not on file  Tobacco Use   Smoking status: Former    Packs/day: 0.30    Years: 50.00    Pack years: 15.00    Types: Cigarettes    Quit date: 08/29/2011    Years since quitting: 9.7   Smokeless tobacco: Never  Vaping Use   Vaping Use:  Former  Substance and Sexual Activity   Alcohol use: Yes    Alcohol/week: 7.0 standard drinks    Types: 7 Standard drinks or equivalent per week    Comment: 1- 2 ounces a night   Drug use: No   Sexual activity: Not Currently    Family History  Problem Relation Age of Onset   Lymphoma Mother        Non-hodgkins   Coronary artery disease Mother    Heart disease Mother    Cancer Father    Alcohol abuse Father    Diabetes Brother    Pancreatic cancer Maternal Uncle    Diabetes Brother    Diabetes Brother    Diabetes Other    Colon cancer Neg Hx    Lung cancer Neg Hx    Esophageal cancer Neg Hx    Rectal cancer Neg Hx    Stomach cancer Neg Hx  Immunization History  Administered Date(s) Administered   Fluad Quad(high Dose 65+) 06/21/2020   Influenza Whole 06/02/2008, 06/02/2009, 06/02/2010, 07/08/2011, 07/08/2012   Influenza, High Dose Seasonal PF 05/18/2017, 06/25/2018, 04/18/2019   Influenza,inj,Quad PF,6+ Mos 07/05/2015   Influenza-Unspecified 05/28/2013, 05/28/2014, 06/30/2016   PFIZER(Purple Top)SARS-COV-2 Vaccination 10/27/2019, 11/25/2019   Pneumococcal Conjugate-13 06/18/2013   Pneumococcal Polysaccharide-23 07/08/2011, 12/29/2016   Tetanus 06/18/2013   Zoster Recombinat (Shingrix) 05/18/2017, 01/14/2018   Zoster, Live 08/26/2014     Objective: There were no vitals filed for this visit.  FAIRLEY COPHER is a pleasant 75 y.o. Caucasian  male in NAD. AAO X 3.  Vascular Examination: Capillary refill time to digits immediate b/l. Palpable pedal pulses b/l LE. Pedal hair absent. Lower extremity skin temperature gradient within normal limits. No pain with calf compression b/l. No edema noted b/l lower extremities.  Dermatological Examination: Pedal skin with normal turgor, texture and tone bilaterally. No open wounds bilaterally. No interdigital macerations bilaterally. Toenails 1-5 b/l elongated, discolored, dystrophic, thickened, crumbly with subungual debris  and tenderness to dorsal palpation. Hyperkeratotic lesion(s) submet head 1 right foot.  No erythema, no edema, no drainage, no fluctuance. Pedal skin noted to be dry and flaky left foot and right foot. There is early cracking of skin noted around left heel and left forefoot area. No erythema, no bleeding, no drainage, no edema.  Musculoskeletal Examination: Normal muscle strength 5/5 to all lower extremity muscle groups bilaterally. No pain crepitus or joint limitation noted with ROM b/l. Hallux valgus with bunion deformity noted b/l lower extremities. Hammertoes noted to the 2-5 bilaterally.  Neurological Examination: Protective sensation diminished with 10g monofilament b/l. Clonus negative b/l.  Hemoglobin A1C Latest Ref Rng & Units 02/17/2021 10/27/2020 07/27/2020  HGBA1C 4.0 - 5.6 % 6.9(A) 8.0(A) 7.5(A)  Some recent data might be hidden   Assessment: 1. Pain due to onychomycosis of toenails of both feet   2. Callus   3. Diabetic peripheral neuropathy associated with type 2 diabetes mellitus ([place])    Plan: -Examined patient. -Continue diabetic foot care principles: inspect feet daily, monitor glucose as recommended by PCP and/or Endocrinologist, and follow prescribed diet per PCP, Endocrinologist and/or dietician. -Patient to continue soft, supportive shoe gear daily. -Toenails 1-5 b/l were debrided in length and girth with sterile nail nippers and dremel without iatrogenic bleeding.  -Callus(es) submet head 1 right foot pared utilizing sterile scalpel blade without complication or incident. Total number debrided =1. -Patient to report any pedal injuries to medical professional immediately. -Patient/POA to call should there be question/concern in the interim.   Marzetta Board, DPM

## 2021-06-14 ENCOUNTER — Telehealth: Payer: Medicare HMO

## 2021-06-14 ENCOUNTER — Ambulatory Visit (INDEPENDENT_AMBULATORY_CARE_PROVIDER_SITE_OTHER): Payer: Medicare HMO

## 2021-06-14 DIAGNOSIS — E113299 Type 2 diabetes mellitus with mild nonproliferative diabetic retinopathy without macular edema, unspecified eye: Secondary | ICD-10-CM

## 2021-06-14 DIAGNOSIS — I10 Essential (primary) hypertension: Secondary | ICD-10-CM

## 2021-06-14 NOTE — Patient Instructions (Signed)
Visit Information:  Thank you for taking the time to speak with me today.   PATIENT GOALS:  Goals Addressed             This Visit's Progress    Monitor and Manage My Blood Sugar-Diabetes Type 2 and Blood pressure - Hypertension   On track    Timeframe:  Long-Range Goal Priority:  High Start Date:   02/15/2021                          Expected End Date: 10/25/2020               Follow Up Date 08/12/2021  - Continue to check blood sugar at prescribed times - Continue to check blood sugar if I feel it is too high or too low - take the blood sugar meter/ log to all doctor visits - Keep current/ updated medication list on your refrigerator for constant reminder -Consider having family member accompany you to your endocrinology appointments.  Consider having family assist  with medication reminders.  - Keep a snack with you to eat if you know you will be late for lunch.  Place yourself on a structured time for lunch (to help combat low blood sugar readings).  Continue to follow: RULE OF 15 How to treat low blood sugars (Blood sugar less than 70 mg/dl  Please follow the RULE OF 15 for the treatment of hypoglycemia treatment (When your blood sugars are less than 70 mg/ dl) STEP  1:  Take 15 grams of carbohydrates when your blood sugar is low, which includes:   3-4 glucose tabs or  3-4 oz of juice or regular soda or  One tube of glucose gel STEP 2:  Recheck blood sugar in 15 minutes STEP 3:  If your blood sugar is still low at the 15 minute recheck ---then, go back to STEP 1 and treat again with another 15 grams of carbohydrates - Continue to check blood pressure at least 1-2 times per week - Continue to take your medications as prescribed and refill timely - Report any new or ongoing symptoms to your provider as soon as possible.  - Keep your follow up appointments with your doctors.      Why is this important?   Checking your blood sugar at home helps to keep it from getting very high  or very low.  Writing the results in a diary or log helps the doctor know how to care for you.  Your blood sugar log should have the time, date and the results.  Also, write down the amount of insulin or other medicine that you take.  Other information, like what you ate, exercise done and how you were feeling, will also be helpful.           The patient verbalized understanding of instructions, educational materials, and care plan provided today and agreed to receive a mailed copy of patient instructions, educational materials, and care plan.   The patient has been provided with contact information for the care management team and has been advised to call with any health related questions or concerns.  The care management team will reach out to the patient again over the next 1-2 months   Quinn Plowman RN,BSN,CCM RN Case Manager [facility]  [phone]

## 2021-06-14 NOTE — Chronic Care Management (AMB) (Signed)
Chronic Care Management   CCM RN Visit Note  06/14/2021 Name: Anthony [name] MRN: [id number] DOB: 03/08/1946  Subjective: Anthony [name] is a 75 y.o. year old male who is a primary care patient of [name], MD. The care management team was consulted for assistance with disease management and care coordination needs.    Engaged with patient by telephone for follow up visit in response to provider referral for case management and/or care coordination services.   Consent to Services:  The patient was given information about Chronic Care Management services, agreed to services, and gave verbal consent prior to initiation of services.  Please see initial visit note for detailed documentation.   Patient agreed to services and verbal consent obtained.   Assessment: Review of patient past medical history, allergies, medications, health status, including review of consultants reports, laboratory and other test data, was performed as part of comprehensive evaluation and provision of chronic care management services.   SDOH (Social Determinants of Health) assessments and interventions performed:    CCM Care Plan  No Known Allergies  Outpatient Encounter Medications as of 06/14/2021  Medication Sig   amLODipine (NORVASC) 5 MG tablet TAKE ONE TABLET BY MOUTH ONCE DAILY   b complex vitamins tablet Take 1 tablet by mouth daily.   Continuous Blood Gluc Transmit (DEXCOM G6 TRANSMITTER) MISC 1 Device by Does not apply route every 3 (three) months.   Dextrose, Diabetic Use, (GLUCOSE PO) Take 1 tablet by mouth as needed.   glucose blood (ONETOUCH ULTRA) test strip USE AS DIRECTED FOUR TIMES DAILY Dx code E11.65   GVOKE HYPOPEN 1-PACK 1 MG/0.2ML SOAJ Inject under skin 0.2 mL as needed for hypoglycemia   insulin degludec (TRESIBA FLEXTOUCH) 200 UNIT/ML FlexTouch Pen INJECT 32U into SUBCUTANEOUSLY DAILY   Insulin Pen Needle (ADVOCATE INSULIN PEN NEEDLES) 31G X 5 MM MISC Use 3x a day    metFORMIN (GLUCOPHAGE-XR) 500 MG 24 hr tablet Take 1 tablet (500 mg total) by mouth daily with breakfast.   NOVOLOG FLEXPEN 100 UNIT/ML FlexPen INJECT 10-18 UNITS into THE SKIN THREE TIMES DAILY with meals   olmesartan-hydrochlorothiazide (BENICAR HCT) 40-25 MG tablet TAKE 1 TABLET BY MOUTH EVERY DAY   pantoprazole (PROTONIX) 40 MG tablet TAKE ONE TABLET BY MOUTH ONCE DAILY   Semaglutide, 1 MG/DOSE, (OZEMPIC, 1 MG/DOSE,) 4 MG/3ML SOPN Inject 1 mg into the skin once a week.   simvastatin (ZOCOR) 20 MG tablet TAKE 1 TABLET BY MOUTH EVERYDAY AT BEDTIME   tadalafil (CIALIS) 20 MG tablet TAKE 1/2 TABLET - ONE tab BY MOUTH EVERY OTHER DAY AS NEEDED FOR erectile dysfunction   No facility-administered encounter medications on file as of 06/14/2021.    Patient Active Problem List   Diagnosis Date Noted   Type 2 diabetes mellitus with hypoglycemia ([place]) 03/10/2021   Memory impairment 02/10/2021   Non-adherence to medical treatment 10/27/2020   COPD (chronic obstructive pulmonary disease) ([place]) 02/28/2020   Coronary artery calcification seen on CAT scan 02/28/2020   Aortic atherosclerosis ([place]) 02/28/2020   Ex-smoker 01/21/2020   RLQ abdominal pain 01/21/2020   Numbness and tingling in left arm 01/21/2020   Health maintenance examination 01/05/2017   Right knee pain 01/18/2016   Barrett's esophagus 12/08/2015   Abnormal tympanic membrane of right ear 07/05/2015   Advanced care planning/counseling discussion 06/30/2014   Medicare annual wellness visit, subsequent 06/22/2013   Benign prostatic hyperplasia with urinary obstruction 06/18/2013   ED (erectile dysfunction) 04/10/2012   Hyperlipidemia associated with type  2 diabetes mellitus ([place]) 12/29/2008   LEG PAIN, BILATERAL 11/19/2008   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 ([place]) 07/24/2007   ANXIETY 07/24/2007   GERD 07/24/2007    Conditions to be addressed/monitored:HTN and DMII  Care Plan :  Diabetes Type 2 (Adult)  Updates made by Dannielle Karvonen, RN since 06/14/2021 12:00 AM     Problem: Diabetes with hypertension management   Priority: High     Long-Range Goal: Glycemic/ blood pressure management   Start Date: 02/15/2021  Expected End Date: 10/25/2021  This Visit's Progress: On track  Recent Progress: On track  Priority: High  Note:   Objective:  Lab Results  Component Value Date   HGBA1C 6.9 (A) 02/17/2021   Lab Results  Component Value Date   CREATININE 1.08 03/08/2021   CREATININE 1.02 03/02/2020   CREATININE 0.91 01/12/2020  Last practice recorded BP readings:  BP Readings from Last 3 Encounters:  12/16/20 128/78  12/15/20 114/83  12/02/20 114/70  Current Barriers:  Patient states he is doing ok.  He states he is currently at the beach.  Patient reports today's fasting blood sugar was 220.  He states he is able to treat any potential low blood sugars early due to [facility] alerting him when blood sugar drops to 90.  Denies any new symptoms or concerns at this time.  Knowledge Deficits related to basic Diabetes / Hypertension pathophysiology and self care/management Case Manager Clinical Goal(s):  patient will demonstrate improved adherence to prescribed treatment plan for diabetes self care/management as evidenced by: daily monitoring and recording of CBG  adherence to ADA/ carb modified diet adherence to prescribed medication regimen contacting provider for new or worsened symptoms or questions:   patient will verbalize understanding of plan for hypertension management patient will attend all scheduled medical appointments: patient will demonstrate improved adherence to prescribed treatment plan for hypertension as evidenced by taking all medications as prescribed, monitoring and recording blood pressure as directed, adhering to low sodium/DASH diet Interventions:  Collaboration with [name], MD regarding development and update of comprehensive plan of  care as evidenced by provider attestation and co-signature Inter-disciplinary care team collaboration (see longitudinal plan of care) Provided ongoing verbal education regarding diabetes and HTN.  Reviewed medications with patient and discussed importance of medication adherence Discussed plans with patient for ongoing care management follow up and provided patient with direct contact information for care management team Reviewed  hypo and hyperglycemia management Reviewed scheduled/upcoming provider appointments:  Next follow up with endocrinologist 07/26/2021,  cardiologist 08/08/2021, primary care provider 08/15/2021 Advised patient, providing education and rationale, to monitor blood pressure/ blood sugars daily and record, calling PCP for findings outside established parameters.  Self-Care Activities  Self administers oral and insulin medications as prescribed Attends all scheduled provider appointments Checks blood sugars as prescribed and utilize hyper and hypoglycemia protocol as needed Adheres to prescribed ADA/carb modified Attends all scheduled provider appointments Calls provider office for new concerns, questions, or BP outside discussed parameters Checks BP and records as discussed Follows a low sodium diet/DASH diet Patient Goals: - Continue to check blood sugar at prescribed times - Continue to check blood sugar if I feel it is too high or too low - take the blood sugar meter/ log to all doctor visits - Keep current/ updated medication list on your refrigerator for constant reminder -Consider having family member accompany you to your endocrinology appointments.  Consider having family assist  with medication reminders.  - Keep  a snack with you to eat if you know you will be late for lunch.  Place yourself on a structured time for lunch (to help combat low blood sugar readings).  Continue to follow: RULE OF 15 How to treat low blood sugars (Blood sugar less than 70  mg/dl  Please follow the RULE OF 15 for the treatment of hypoglycemia treatment (When your blood sugars are less than 70 mg/ dl) STEP  1:  Take 15 grams of carbohydrates when your blood sugar is low, which includes:   3-4 glucose tabs or  3-4 oz of juice or regular soda or  One tube of glucose gel STEP 2:  Recheck blood sugar in 15 minutes STEP 3:  If your blood sugar is still low at the 15 minute recheck ---then, go back to STEP 1 and treat again with another 15 grams of carbohydrates - Continue to check blood pressure at least 1-2 times per week - Continue to take your medications as prescribed and refill timely - Report any new or ongoing symptoms to your provider as soon as possible.  - Keep your follow up appointments with your doctors.  Follow Up Plan: The patient has been provided with contact information for the care management team and has been advised to call with any health related questions or concerns.  The care management team will reach out to the patient again over the next 1-2 months.         Plan:The patient has been provided with contact information for the care management team and has been advised to call with any health related questions or concerns.  The care management team will reach out to the patient again over the next 1-2 months  Quinn Plowman RN,BSN,CCM RN Case Manager [facility]  [phone]

## 2021-06-22 ENCOUNTER — Telehealth: Payer: Self-pay

## 2021-06-22 DIAGNOSIS — E113299 Type 2 diabetes mellitus with mild nonproliferative diabetic retinopathy without macular edema, unspecified eye: Secondary | ICD-10-CM

## 2021-06-22 MED ORDER — BLOOD GLUCOSE MONITOR KIT: PACK | 0 refills | Status: DC

## 2021-06-22 NOTE — Telephone Encounter (Signed)
Patient called, he needs a new glucometer. He cannot use his Dexcom without a glucose reading and his glucometer stopped working. New prescription sent to [facility] for delivery today.  Debbora Dus, PharmD Clinical Pharmacist [facility] at [facility] [phone]

## 2021-06-22 NOTE — Progress Notes (Addendum)
Coordinated glucometer refill for delivery today  Debbora Dus, CPP notified  Avel Sensor, [place] Assistant [phone]  Total time spent for month CPA: 10 min.

## 2021-06-23 NOTE — Telephone Encounter (Signed)
Patient called. He needed assistance with new glucometer. Discussed with patient. He will try again this afternoon and call me back if he cannot get it to work.  Debbora Dus, PharmD Clinical Pharmacist [facility] at [facility] [phone]

## 2021-06-24 DIAGNOSIS — E113291 Type 2 diabetes mellitus with mild nonproliferative diabetic retinopathy without macular edema, right eye: Secondary | ICD-10-CM | POA: Diagnosis not present

## 2021-06-24 DIAGNOSIS — Z794 Long term (current) use of insulin: Secondary | ICD-10-CM | POA: Diagnosis not present

## 2021-06-27 DIAGNOSIS — I10 Essential (primary) hypertension: Secondary | ICD-10-CM | POA: Diagnosis not present

## 2021-06-27 DIAGNOSIS — E113299 Type 2 diabetes mellitus with mild nonproliferative diabetic retinopathy without macular edema, unspecified eye: Secondary | ICD-10-CM

## 2021-06-28 ENCOUNTER — Telehealth: Payer: Self-pay

## 2021-06-28 NOTE — Progress Notes (Addendum)
Chronic Care Management Pharmacy Assistant   Name: RISHIKESH KHACHATRYAN  MRN: [id number] DOB: [birth year]  Reason for Encounter: CCM (Diabetes Disease State)  Recent office visits:  None since last CCM contact  Recent consult visits:  06/07/2021 - Podiatry - Patient presented for at risk foot  care with history of diabetic neuropathy and callus(es)  b/l and painful thick toenails that are difficult to trim. Toe nails 1-5 b/l were debrided in length and girth with sterile nail nippers and dremel without iatrogenic bleeding.  Hospital visits:  None in previous 6 months  Medications: Outpatient Encounter Medications as of 06/28/2021  Medication Sig   amLODipine (NORVASC) 5 MG tablet TAKE ONE TABLET BY MOUTH ONCE DAILY   b complex vitamins tablet Take 1 tablet by mouth daily.   blood glucose meter kit and supplies KIT Dispense based on patient and insurance preference. Use up to four times daily as directed.   Continuous Blood Gluc Transmit (DEXCOM G6 TRANSMITTER) MISC 1 Device by Does not apply route every 3 (three) months.   Dextrose, Diabetic Use, (GLUCOSE PO) Take 1 tablet by mouth as needed.   glucose blood (ONETOUCH ULTRA) test strip USE AS DIRECTED FOUR TIMES DAILY Dx code E11.65   GVOKE HYPOPEN 1-PACK 1 MG/0.2ML SOAJ Inject under skin 0.2 mL as needed for hypoglycemia   insulin degludec (TRESIBA FLEXTOUCH) 200 UNIT/ML FlexTouch Pen INJECT 32U into SUBCUTANEOUSLY DAILY   Insulin Pen Needle (ADVOCATE INSULIN PEN NEEDLES) 31G X 5 MM MISC Use 3x a day   metFORMIN (GLUCOPHAGE-XR) 500 MG 24 hr tablet Take 1 tablet (500 mg total) by mouth daily with breakfast.   NOVOLOG FLEXPEN 100 UNIT/ML FlexPen INJECT 10-18 UNITS into THE SKIN THREE TIMES DAILY with meals   olmesartan-hydrochlorothiazide (BENICAR HCT) 40-25 MG tablet TAKE 1 TABLET BY MOUTH EVERY DAY   pantoprazole (PROTONIX) 40 MG tablet TAKE ONE TABLET BY MOUTH ONCE DAILY   Semaglutide, 1 MG/DOSE, (OZEMPIC, 1 MG/DOSE,) 4 MG/3ML SOPN  Inject 1 mg into the skin once a week.   simvastatin (ZOCOR) 20 MG tablet TAKE 1 TABLET BY MOUTH EVERYDAY AT BEDTIME   tadalafil (CIALIS) 20 MG tablet TAKE 1/2 TABLET - ONE tab BY MOUTH EVERY OTHER DAY AS NEEDED FOR erectile dysfunction   No facility-administered encounter medications on file as of 06/28/2021.   Recent Relevant Labs: Lab Results  Component Value Date/Time   HGBA1C 6.9 (A) 02/17/2021 04:22 PM   HGBA1C 8.0 (A) 10/27/2020 10:56 AM   HGBA1C 7.7 (H) 03/02/2020 10:26 AM   HGBA1C 7.8 (H) 01/12/2020 10:21 AM   MICROALBUR 6.3 (H) 06/28/2015 09:39 AM   MICROALBUR 4.4 (H) 06/26/2014 09:05 AM    Kidney Function Lab Results  Component Value Date/Time   CREATININE 1.08 03/08/2021 03:08 PM   CREATININE 1.02 03/02/2020 10:26 AM   GFR 68.45 03/08/2021 03:08 PM   GFRNONAA >60 03/02/2020 10:26 AM   GFRAA >60 03/02/2020 10:26 AM   Attempted contact with patient 3 times on 11/01, 11/09 and 11/11. Unsuccessful outreach.   Current antihyperglycemic regimen:  Metformin XR 500 mg - 1 tablet daily before breakfast Insulin aspart (NovoLog) - 08-11-15 units, 15 minutes before meals  Tresiba - 30 units once daily at bedtime Ozempic 1 mg - Inject once weekly on Mondays    Adherence Review: Is the patient currently on a STATIN medication? Yes Is the patient currently on ACE/ARB medication? Yes Does the patient have >5 day gap between last estimated fill dates? Yes -  Metformin  Care Gaps: Annual wellness visit in last year? Yes Most recent A1C reading: 6.9 on 02/17/2021 Most Recent BP reading: 140/84 on 04/19/2021  Last eye exam / retinopathy screening: Up to date Last diabetic foot exam: Up to date  Star Rating Drugs:  Medication:  Last Fill: Day Supply Simvastatin 67m 06/10/2021      90 Ozempic 134m 06/10/2021      30 Olmesartan 40-2583m0/14/2022      90 Tresiba 200unit 06/13/2021      37 Metformin XR 500m76m4/02/2021        Patient verified April fill date on last CCM  contact for Metformin; states he got sent 5 giant bottles so he still has plenty.   Endocrinology appointment on 07/26/2021  MichDebbora DusP notified  Agron Swiney Marijean NiemannA Mountain Homeistant [phone] have reviewed the care management and care coordination activities outlined in this encounter and I am certifying that I agree with the content of this note. No further action required.  MichDebbora DusarmD Clinical Pharmacist [phone]

## 2021-07-05 ENCOUNTER — Other Ambulatory Visit: Payer: Self-pay | Admitting: Family Medicine

## 2021-07-05 NOTE — Telephone Encounter (Signed)
Refill request cialis Last office visit 03/08/21 Last refill 05/03/21 #5/5 refills

## 2021-07-14 ENCOUNTER — Ambulatory Visit (INDEPENDENT_AMBULATORY_CARE_PROVIDER_SITE_OTHER): Payer: Medicare HMO

## 2021-07-14 DIAGNOSIS — I10 Essential (primary) hypertension: Secondary | ICD-10-CM

## 2021-07-14 DIAGNOSIS — Z794 Long term (current) use of insulin: Secondary | ICD-10-CM

## 2021-07-14 DIAGNOSIS — R413 Other amnesia: Secondary | ICD-10-CM

## 2021-07-14 NOTE — Chronic Care Management (AMB) (Signed)
Chronic Care Management     07/14/2021 Name: Anthony [name] MRN: [id number] DOB: 04-Oct-1945  Subjective: Anthony [name] is a 75 y.o. year old male who is a primary care patient of Anthony Bush, MD. The care management team was consulted for assistance with disease management and care coordination needs.    Engaged with patient by telephone for follow up visit in response to provider referral for case management and/or care coordination services.   Consent to Services:  The patient was given information about Chronic Care Management services, agreed to services, and gave verbal consent prior to initiation of services.  Please see initial visit note for detailed documentation.   Patient agreed to services and verbal consent obtained.   Assessment: Review of patient past medical history, allergies, medications, health status, including review of consultants reports, laboratory and other test data, was performed as part of comprehensive evaluation and provision of chronic care management services.   SDOH (Social Determinants of Health) assessments and interventions performed:    CCM Care Plan  No Known Allergies  Outpatient Encounter Medications as of 07/14/2021  Medication Sig   amLODipine (NORVASC) 5 MG tablet TAKE ONE TABLET BY MOUTH ONCE DAILY   b complex vitamins tablet Take 1 tablet by mouth daily.   blood glucose meter kit and supplies KIT Dispense based on patient and insurance preference. Use up to four times daily as directed.   Continuous Blood Gluc Transmit (DEXCOM G6 TRANSMITTER) MISC 1 Device by Does not apply route every 3 (three) months.   Dextrose, Diabetic Use, (GLUCOSE PO) Take 1 tablet by mouth as needed.   glucose blood (ONETOUCH ULTRA) test strip USE AS DIRECTED FOUR TIMES DAILY Dx code E11.65   GVOKE HYPOPEN 1-PACK 1 MG/0.2ML SOAJ Inject under skin 0.2 mL as needed for hypoglycemia   insulin degludec (TRESIBA FLEXTOUCH) 200 UNIT/ML FlexTouch Pen INJECT  32U into SUBCUTANEOUSLY DAILY   Insulin Pen Needle (ADVOCATE INSULIN PEN NEEDLES) 31G X 5 MM MISC Use 3x a day   metFORMIN (GLUCOPHAGE-XR) 500 MG 24 hr tablet Take 1 tablet (500 mg total) by mouth daily with breakfast.   NOVOLOG FLEXPEN 100 UNIT/ML FlexPen INJECT 10-18 UNITS into THE SKIN THREE TIMES DAILY with meals   olmesartan-hydrochlorothiazide (BENICAR HCT) 40-25 MG tablet TAKE 1 TABLET BY MOUTH EVERY DAY   pantoprazole (PROTONIX) 40 MG tablet TAKE ONE TABLET BY MOUTH ONCE DAILY   Semaglutide, 1 MG/DOSE, (OZEMPIC, 1 MG/DOSE,) 4 MG/3ML SOPN Inject 1 mg into the skin once a week.   simvastatin (ZOCOR) 20 MG tablet TAKE 1 TABLET BY MOUTH EVERYDAY AT BEDTIME   tadalafil (CIALIS) 20 MG tablet TAKE 1/2 TO 1 TABLET BY MOUTH EVERY OTHER DAY AS NEEDED FOR erectile dysfunction   No facility-administered encounter medications on file as of 07/14/2021.    Patient Active Problem List   Diagnosis Date Noted   Type 2 diabetes mellitus with hypoglycemia ([place]) 03/10/2021   Memory impairment 02/10/2021   Non-adherence to medical treatment 10/27/2020   COPD (chronic obstructive pulmonary disease) ([place]) 02/28/2020   Coronary artery calcification seen on CAT scan 02/28/2020   Aortic atherosclerosis ([place]) 02/28/2020   Ex-smoker 01/21/2020   RLQ abdominal pain 01/21/2020   Numbness and tingling in left arm 01/21/2020   Health maintenance examination 01/05/2017   Right knee pain 01/18/2016   Barrett's esophagus 12/08/2015   Abnormal tympanic membrane of right ear 07/05/2015   Advanced care planning/counseling discussion 06/30/2014   Medicare annual wellness visit, subsequent 06/22/2013  Benign prostatic hyperplasia with urinary obstruction 06/18/2013   ED (erectile dysfunction) 04/10/2012   Hyperlipidemia associated with type 2 diabetes mellitus ([place]) 12/29/2008   LEG PAIN, BILATERAL 11/19/2008   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 ([place]) 07/24/2007    ANXIETY 07/24/2007   GERD 07/24/2007    Conditions to be addressed/monitored:HTN, DMII, and Memory Impairment  Care Plan : [facility] plan of care  Updates made by Dannielle Karvonen, RN since 07/14/2021 12:00 AM     Problem: Chronic disese management education and / or care coordination needs.( Diabetes, Hypertension, Memory Impairment)   Priority: High     Long-Range Goal: Development of plan of care to address chronic disease management and / or care coordination needs ( DMII, Hypertension, Memory impairment)   Priority: High  Note:   Current Barriers:  Knowledge Deficits related to plan of care for management of HTN and DMII  Chronic Disease Management support and education needs related to HTN and DMII Cognitive Deficits Memory impairment Patient states he was not able to keep his follow up appointment with his primary provider recently.  He reports rescheduling appointment to 08/15/2021.  Patient states his fasting blood sugar was in the 300's.  He states his blood sugars have been elevated during the night and morning recently. He states this occurs even when taking his tresiba in the evening.   Patient states if his blood sugar is low in the evening he does not take his [name].  He states his blood sugar will still run high in the middle of the night and morning.   RNCM advised patient to call his endocrinologist to notify of ongoing elevated blood sugars.  RNCM Clinical Goal(s):  Patient will verbalize understanding of plan for management of HTN and DMII take all medications exactly as prescribed and will call provider for medication related questions attend all scheduled medical appointments:  continue to work with RN Care Manager to address care management and care coordination needs related to  HTN and DMII through collaboration with RN Care manager, provider, and care team.  patient will demonstrate improved adherence to prescribed treatment plan for diabetes self care/management as  evidenced by: daily monitoring and recording of CBG  adherence to ADA/ carb modified diet adherence to prescribed medication regimen contacting provider for new or worsened symptoms or questions:   Interventions: 1:1 collaboration with primary care provider regarding development and update of comprehensive plan of care as evidenced by provider attestation and co-signature Inter-disciplinary care team collaboration (see longitudinal plan of care) Evaluation of current treatment plan related to  self management and patient's adherence to plan as established by provider  Hypertension Interventions: Goal on track: Yes Last practice recorded BP readings:  BP Readings from Last 3 Encounters:  04/19/21 140/84  03/08/21 116/72  02/17/21 (!) 148/80  Most recent eGFR/CrCl: No results found for: EGFR  No components found for: CRCL  Evaluation of current treatment plan related to hypertension self management and patient's adherence to plan as established by provider; Reviewed medications with patient and discussed importance of compliance; Discussed plans with patient for ongoing care management follow up and provided patient with direct contact information for care management team; Reviewed scheduled/upcoming provider appointments including:  Monitor blood pressure at least 2-3 times per month Notify provider of elevated night and fasting blood sugars  Memory impairment interventions: New goal. Evaluation of current treatment plan related to  memory impairment , self-management and patient's adherence to plan as established by provider.  Discussed plans with patient for ongoing care management follow up and provided patient with direct contact information for care management team Reviewed medications with patient and discussed ; Reviewed scheduled/upcoming provider appointments including; Discussed with patient memory impairment self care strategies  Diabetes Interventions: Goal on tract:  Yes Assessed patient's understanding of A1c goal: <7% Evaluation of current treatment plan related to memory impairment, self-management and patient's adherence to plan as established by provider. Discussed plans with patient for ongoing care management follow up and provided patient with direct contact information for care management team Lab Results  Component Value Date   HGBA1C 6.9 (A) 02/17/2021     Patient Goals/Self-Care Activities: Patient will self administer medications as prescribed as evidenced by self report/primary caregiver report  Patient will attend all scheduled provider appointments as evidenced by clinician review of documented attendance to scheduled appointments and patient/caregiver report Patient will call pharmacy for medication refills as evidenced by patient report and review of pharmacy fill history as appropriate Patient will call provider office for new concerns or questions as evidenced by review of documented incoming telephone call notes and patient report Apply memory impairment strategies:  Socialize regularly, get a good nights sleep, eat a healthy diet, read,  and games such as ( sudoku, crossword puzzles) Check blood pressure 2-3 times per month Consider having family member accompany you to your appointments and assist with medication reminders Continue to check your blood sugar as directed by your provider, take your blood sugar readings to your provider appointment Treat low blood sugar according to " the rule of 15" ( keep a snack with you to eat if you know you will be late for lunch)   How to treat low blood sugars (Blood sugar less than 70 mg/dl  Please follow the RULE OF 15 for the treatment of hypoglycemia treatment (When your blood sugars are less than 70 mg/ dl) STEP  1:  Take 15 grams of carbohydrates when your blood sugar is low, which includes:   3-4 glucose tabs or  3-4 oz of juice or regular soda or  One tube of glucose gel STEP 2:   Recheck blood sugar in 15 minutes STEP 3:  If your blood sugar is still low at the 15 minute recheck ---then, go back to STEP 1 and treat again with another 15 grams of carbohydrates - Continue to check blood pressure at least 1-2 times per week - Continue to take your medications as prescribed and refill timely - Report any new or ongoing symptoms to your provider as soon as possible.  - Keep your follow up appointments with your doctors.       Plan:The patient has been provided with contact information for the care management team and has been advised to call with any health related questions or concerns.  The care management team will reach out to the patient again over the next 2-3 months. Quinn Plowman RN,BSN,CCM RN Case Manager [place]  [phone]

## 2021-07-14 NOTE — Patient Instructions (Addendum)
Visit Information  Thank you for taking time to visit with me today. Please don't hesitate to contact me if I can be of assistance to you before our next scheduled telephone appointment.  The patient has been provided with contact information for the care management team and has been advised to call with any health related questions or concerns.  The care management team will reach out to the patient on September 29, 2021 at 2:00 pm  If you need to cancel or re-schedule our visit, please call [phone] and our care guide team will be happy to assist you.  Patient Goals/Self-Care Activities: Patient will self administer medications as prescribed as evidenced by self report/primary caregiver report  Patient will attend all scheduled provider appointments as evidenced by clinician review of documented attendance to scheduled appointments and patient/caregiver report Patient will call pharmacy for medication refills as evidenced by patient report and review of pharmacy fill history as appropriate Patient will call provider office for new concerns or questions as evidenced by review of documented incoming telephone call notes and patient report Apply memory impairment strategies:  Socialize regularly, get a good nights sleep, eat a healthy diet, read,  and games such as ( sudoku, crossword puzzles) Check blood pressure 2-3 times per month Consider having family member accompany you to your appointments and assist with medication reminders Continue to check your blood sugar as directed by your provider, take your blood sugar readings to your provider appointment Treat low blood sugar according to " the rule of 15" ( keep a snack with you to eat if you know you will be late for lunch)                         How to treat low blood sugars (Blood sugar less than 70 mg/dl             Please follow the RULE OF 15 for the treatment of hypoglycemia treatment (When your blood sugars are less than 70 mg/  dl) STEP  1:  Take 15 grams of carbohydrates when your blood sugar is low, which includes:              3-4 glucose tabs or             3-4 oz of juice or regular soda or             One tube of glucose gel STEP 2:  Recheck blood sugar in 15 minutes STEP 3:  If your blood sugar is still low at the 15 minute recheck ---then, go back to STEP 1 and treat again with another 15 grams of carbohydrates - Continue to check blood pressure at least 1-2 times per week - Continue to take your medications as prescribed and refill timely - Report any new or ongoing symptoms to your provider as soon as possible.  - Keep your follow up appointments with your doctors.   The patient verbalized understanding of instructions, educational materials, and care plan provided today and agreed to receive a mailed copy of patient instructions, educational materials, and care plan.   Quinn Plowman RN,BSN,CCM RN Case Manager [place]  [phone]

## 2021-07-24 DIAGNOSIS — E113291 Type 2 diabetes mellitus with mild nonproliferative diabetic retinopathy without macular edema, right eye: Secondary | ICD-10-CM | POA: Diagnosis not present

## 2021-07-24 DIAGNOSIS — Z794 Long term (current) use of insulin: Secondary | ICD-10-CM | POA: Diagnosis not present

## 2021-07-26 ENCOUNTER — Encounter: Payer: Self-pay | Admitting: Internal Medicine

## 2021-07-26 ENCOUNTER — Ambulatory Visit: Payer: Medicare HMO | Admitting: Internal Medicine

## 2021-07-26 ENCOUNTER — Other Ambulatory Visit: Payer: Self-pay

## 2021-07-26 VITALS — BP 140/80 | HR 69 | Ht 72.0 in | Wt 211.2 lb

## 2021-07-26 DIAGNOSIS — E785 Hyperlipidemia, unspecified: Secondary | ICD-10-CM | POA: Diagnosis not present

## 2021-07-26 DIAGNOSIS — E113299 Type 2 diabetes mellitus with mild nonproliferative diabetic retinopathy without macular edema, unspecified eye: Secondary | ICD-10-CM | POA: Diagnosis not present

## 2021-07-26 DIAGNOSIS — Z794 Long term (current) use of insulin: Secondary | ICD-10-CM

## 2021-07-26 DIAGNOSIS — E663 Overweight: Secondary | ICD-10-CM

## 2021-07-26 LAB — POCT GLYCOSYLATED HEMOGLOBIN (HGB A1C): Hemoglobin A1C: 7 % — AB (ref 4.0–5.6)
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
## 2021-07-26 NOTE — Patient Instructions (Addendum)
Please continue: - Metformin ER 500 mg daily - Ozempic 1 mg weekly - Novolog 15 minutes before each meal: - 12 units before small meals - 15 units before regular meals - 18-20 units before larger meals  If the sugars are <90 before meals, take NovoLog at the start of the meal.  Please move: - Tresiba 32 units daily to the morning  Set an alarm daily to take [name].  Please return in 3 months.

## 2021-07-27 DIAGNOSIS — Z794 Long term (current) use of insulin: Secondary | ICD-10-CM | POA: Diagnosis not present

## 2021-07-27 DIAGNOSIS — E11649 Type 2 diabetes mellitus with hypoglycemia without coma: Secondary | ICD-10-CM

## 2021-07-27 DIAGNOSIS — I10 Essential (primary) hypertension: Secondary | ICD-10-CM

## 2021-07-27 DIAGNOSIS — Z7984 Long term (current) use of oral hypoglycemic drugs: Secondary | ICD-10-CM

## 2021-08-03 ENCOUNTER — Other Ambulatory Visit: Payer: Self-pay | Admitting: Family Medicine

## 2021-08-04 ENCOUNTER — Telehealth: Payer: Self-pay

## 2021-08-04 NOTE — Progress Notes (Addendum)
[place] Management Pharmacy Assistant   Name: Anthony [name]  MRN: [id number] DOB: [birth year]  Reason for Encounter: CCM (Medication Adherence and Delivery Coordination)   Recent office visits:  None since last CCM contact  Recent consult visits:  07/26/2021 - Internal Medicine - Patient presented for follow up for diabetes type 2. Labs: A1c. Change: Take Tresiba 32 units daily in the morning vs. evening. No other medication changes.   Hospital visits:  None in previous 6 months  Medications: Outpatient Encounter Medications as of 08/04/2021  Medication Sig   amLODipine (NORVASC) 5 MG tablet TAKE ONE TABLET BY MOUTH ONCE DAILY   b complex vitamins tablet Take 1 tablet by mouth daily.   blood glucose meter kit and supplies KIT Dispense based on patient and insurance preference. Use up to four times daily as directed.   Continuous Blood Gluc Transmit (DEXCOM G6 TRANSMITTER) MISC 1 Device by Does not apply route every 3 (three) months.   Dextrose, Diabetic Use, (GLUCOSE PO) Take 1 tablet by mouth as needed.   glucose blood (ONETOUCH ULTRA) test strip USE AS DIRECTED FOUR TIMES DAILY Dx code E11.65   GVOKE HYPOPEN 1-PACK 1 MG/0.2ML SOAJ Inject under skin 0.2 mL as needed for hypoglycemia   insulin degludec (TRESIBA FLEXTOUCH) 200 UNIT/ML FlexTouch Pen INJECT 32U into SUBCUTANEOUSLY DAILY   Insulin Pen Needle (ADVOCATE INSULIN PEN NEEDLES) 31G X 5 MM MISC Use 3x a day   metFORMIN (GLUCOPHAGE-XR) 500 MG 24 hr tablet Take 1 tablet (500 mg total) by mouth daily with breakfast.   NOVOLOG FLEXPEN 100 UNIT/ML FlexPen INJECT 10-18 UNITS into THE SKIN THREE TIMES DAILY with meals   olmesartan-hydrochlorothiazide (BENICAR HCT) 40-25 MG tablet TAKE 1 TABLET BY MOUTH EVERY DAY   pantoprazole (PROTONIX) 40 MG tablet TAKE ONE TABLET BY MOUTH ONCE DAILY   Semaglutide, 1 MG/DOSE, (OZEMPIC, 1 MG/DOSE,) 4 MG/3ML SOPN Inject 1 mg into the skin once a week.   simvastatin (ZOCOR) 20 MG tablet  TAKE 1 TABLET BY MOUTH EVERYDAY AT BEDTIME   tadalafil (CIALIS) 20 MG tablet TAKE 1/2 TO 1 TABLET BY MOUTH EVERY OTHER DAY AS NEEDED FOR erectile dysfunction   No facility-administered encounter medications on file as of 08/04/2021.   Ask about Ozempic patient assistance due to cost.  BP Readings from Last 3 Encounters:  07/26/21 140/80  04/19/21 140/84  03/08/21 116/72    Lab Results  Component Value Date   HGBA1C 7.0 (A) 07/26/2021    Recent OV, Consult or Hospital visit:  Recent medication changes indicated:  Take Tresiba 32 units daily in the morning vs. evening.   Last adherence delivery date: 06/17/2021      Patient is due for next adherence delivery on: 08/17/2021  Multiple attempts made to reach patient. Unsuccessful outreach. Will refill based off of last adherence fill.   This delivery to include: Vials  30 Days VIAL medications: Tresiba Flex 200 units- 32 units once daily in morning  Amlodipine 5 mg 1 tablet daily Pantoprazole   40 mg 1 tablet daily Tadalafil 20 mg - 1/2 to 1 tablet daily as needed- PRN use Novolog Flexpen Inject 10-18 units into the skin 3 times daily with meals Test strips - Use up to four times daily Ozempic 1 mg/dose - Inject once weekly (pending cost)  Medications not due for refill: Simvastatin - filled at [facility] 06/10/21 90 DS (please include in next delivery) Olmesartan/HCTZ - filled at [facility] 06/10/21 90 DS (please include in  next delivery)  Refills requested from providers include: Amlodipine 5 mg 1 tablet daily Pantoprazole   40 mg 1 tablet daily Tadalafil 20 mg - 1/2 to 1 tablet daily as needed- PRN use  Delivery scheduled for 08/17/2021. Unable to speak with patient to confirm date.    Annual wellness visit in last year? Yes 03/08/2021 Most Recent BP reading: 140/80 on 07/26/2021  If Diabetic: Most recent A1C reading: 7.0 on 07/26/2021 Last eye exam / retinopathy screening: Up to date Last diabetic foot exam: 07/27/2020  Debbora Dus, CPP notified  [name], [place] Assistant [phone]  I have reviewed the care management and care coordination activities outlined in this encounter and I am certifying that I agree with the content of this note. See addendum.   Debbora Dus, PharmD Clinical Pharmacist [facility] [phone]

## 2021-08-07 NOTE — Progress Notes (Signed)
Cardiology Office Note:    Date:  08/08/2021   ID:  Anthony [name], DOB 04-Jan-1946, MRN [id number]  PCP:  [name], MD  [facility] Cardiologist: ,Rudean Haskell MD [place] Electrophysiologist:  None   CC: Shortness of breath follow up  History of Present Illness:    Anthony [name] is a 75 y.o. male with a hx of CAC, Aortic Atherosclerosis, DM with HTN, HLD, Erectile Dysfunction, COPD former smoker who presents for evaluation 08/31/20.  In interim of this visit, patient had uncomplicated cataract surgery.  We had talked about possible stress testing at last visits.  Seen 08/08/21.  Patient notes that he is doing ok. Getting old.  Taking care of his wife who has a new diagnosis of dementia.    No change from prior. There are no interval hospital/ED visit.    No chest pain or pressure.  No SOB/DOE and no PND/Orthopnea.  No weight gain or leg swelling.  No palpitations or syncope.  Does all the housework and cooks all the meals.  Notes that he eats out more now that he takes care of his wife more.  Has an BG monitor and has had improved glucose control.  Past Medical History:  Diagnosis Date   Anxiety    denies   Arthritis    a little   Barrett's esophagus 12/08/2015   By EGD 2010 [name])    BPH with obstruction/lower urinary tract symptoms 2014   s/p TURP   Depression    denies   DKA (diabetic ketoacidoses) 01/2010   "in coma"   GERD (gastroesophageal reflux disease)    H/O bronchitis    after every cold   HTN (hypertension)    Hyperlipidemia    controlled with medicine   Otitis media    Rheumatic fever    maybe as child   Type 2 diabetes, uncontrolled, with mild 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 macular edema    completed DMSE    Past Surgical History:  Procedure Laterality Date   ABI  12/2013   WNL   APPENDECTOMY     CATARACT EXTRACTION W/PHACO Right 12/01/2020   Procedure: CATARACT EXTRACTION PHACO AND INTRAOCULAR LENS PLACEMENT  ([place])  RIGHT VIVITY TORIC LENS DIABETIC;  Surgeon: Leandrew Koyanagi, MD;  Location: [place];  Service: Ophthalmology;  Laterality: Right;  6.93 1:12.8 9.5%   CATARACT EXTRACTION W/PHACO Left 12/15/2020   Procedure: CATARACT EXTRACTION PHACO AND INTRAOCULAR LENS PLACEMENT ([place])  VIVITY TORIC LENS DIABETIC LEFT EYE;  Surgeon: Leandrew Koyanagi, MD;  Location: [place];  Service: Ophthalmology;  Laterality: Left;  3.47 1:03.8 5.5%   CHOLECYSTECTOMY     years ago   COLONOSCOPY  01/2009   TA, rec rpt 5 yrs [name])   COLONOSCOPY  01/2018   TAx2, diverticulosis, int hemorrhoids, rpt 5 yrs ([name])   ESOPHAGOGASTRODUODENOSCOPY  01/2009   biopsy with Barrett's   ESOPHAGOGASTRODUODENOSCOPY  01/2018   WNL - no barrett's ([name])   INGUINAL HERNIA REPAIR Bilateral 03/04/2020   Procedure: LAPAROSCOPIC BILATERAL INGUINAL HERNIA REPAIR WITH MESH;  Surgeon: [name], MD;  Location: [place];  Service: General;  Laterality: Bilateral;   LAPAROSCOPIC APPENDECTOMY  07/05/2011   Procedure: APPENDECTOMY LAPAROSCOPIC;  Surgeon: [name], MD;  Location: [place];  Service: General;  Laterality: N/A;   MENISCUS REPAIR Right 2018   [name] @ [place] N/A 07/28/2013   Procedure: TRANSURETHRAL RESECTION OF THE PROSTATE WITH GYRUS INSTRUMENTS;  Surgeon: Elta Guadeloupe  C Ottelin, MD   VASECTOMY     WISDOM TOOTH EXTRACTION      Current Medications: Current Meds  Medication Sig   amLODipine (NORVASC) 5 MG tablet TAKE ONE TABLET BY MOUTH ONCE DAILY   b complex vitamins tablet Take 1 tablet by mouth daily.   blood glucose meter kit and supplies KIT Dispense based on patient and insurance preference. Use up to four times daily as directed.   Continuous Blood Gluc Transmit (DEXCOM G6 TRANSMITTER) MISC 1 Device by Does not apply route every 3 (three) months.   Dextrose, Diabetic Use, (GLUCOSE PO) Take 1 tablet by mouth as needed.   glucose  blood (ONETOUCH ULTRA) test strip USE AS DIRECTED FOUR TIMES DAILY Dx code E11.65   GVOKE HYPOPEN 1-PACK 1 MG/0.2ML SOAJ Inject under skin 0.2 mL as needed for hypoglycemia   insulin degludec (TRESIBA FLEXTOUCH) 200 UNIT/ML FlexTouch Pen INJECT 32U into SUBCUTANEOUSLY DAILY   Insulin Pen Needle (ADVOCATE INSULIN PEN NEEDLES) 31G X 5 MM MISC Use 3x a day   metFORMIN (GLUCOPHAGE-XR) 500 MG 24 hr tablet Take 1 tablet (500 mg total) by mouth daily with breakfast.   NOVOLOG FLEXPEN 100 UNIT/ML FlexPen INJECT 10-18 UNITS into THE SKIN THREE TIMES DAILY with meals   olmesartan-hydrochlorothiazide (BENICAR HCT) 40-25 MG tablet TAKE 1 TABLET BY MOUTH EVERY DAY   pantoprazole (PROTONIX) 40 MG tablet TAKE ONE TABLET BY MOUTH ONCE DAILY   Semaglutide, 1 MG/DOSE, (OZEMPIC, 1 MG/DOSE,) 4 MG/3ML SOPN Inject 1 mg into the skin once a week.   simvastatin (ZOCOR) 20 MG tablet TAKE 1 TABLET BY MOUTH EVERYDAY AT BEDTIME   tadalafil (CIALIS) 20 MG tablet TAKE 1/2 TO 1 TABLET BY MOUTH EVERY OTHER DAY AS NEEDED FOR erectile dysfunction     Allergies:   Patient has no known allergies.   Social History   Socioeconomic History   Marital status: Married    Spouse name: Not on file   Number of children: Not on file   Years of education: Not on file   Highest education level: Not on file  Occupational History   Not on file  Tobacco Use   Smoking status: Former    Packs/day: 0.30    Years: 50.00    Pack years: 15.00    Types: Cigarettes    Quit date: 08/29/2011    Years since quitting: 9.9   Smokeless tobacco: Never  Vaping Use   Vaping Use: Former  Substance and Sexual Activity   Alcohol use: Yes    Alcohol/week: 7.0 standard drinks    Types: 7 Standard drinks or equivalent per week    Comment: 1- 2 ounces a night   Drug use: No   Sexual activity: Not Currently  Other Topics Concern   Not on file  Social History Narrative   MD ROSTER:   GI - [place]   GS - Young      Daily Caffeine Use:  2    Owner/operater of dental lab-sold, but continues to work. Fully retired.   Married 1969   2 daughters 1972, 1976; 1 son 1974   7 grandchildren   Edu: 10th Grade   Hobby: car restoration: has '66 Vette, two classic Chevelle SS's and is restoring a '37 chevy coup   Regular Exercise -  NO   Diet: good water, fruits/vegetables daily   Social Determinants of Health   Financial Resource Strain: Medium Risk   Difficulty of Paying Living Expenses: Somewhat hard  Food Insecurity:   No Food Insecurity   Worried About Charity fundraiser in the Last Year: Never true   Ran Out of Food in the Last Year: Never true  Transportation Needs: No Transportation Needs   Lack of Transportation (Medical): No   Lack of Transportation (Non-Medical): No  Physical Activity: Not on file  Stress: Not on file  Social Connections: Not on file    [place]; worked at Pharmac[employer], community, wife has new mild dementia  Family History: The patient's family history includes Alcohol abuse in his father; Cancer in his father; Coronary artery disease in his mother; Diabetes in his brother, brother, brother and another family member; Heart disease in his mother; Lymphoma in his mother; Pancreatic cancer in his maternal uncle. There is no history of Colon cancer, Lung cancer, Esophageal cancer, Rectal cancer, or Stomach cancer. History of coronary artery disease notable for mother. History of heart failure notable for no members. History of arrhythmia notable for no members. No history of bicuspid aortic valve or aortic aneurysm or dissection.  ROS:   Please see the history of present illness.     All other systems reviewed and are negative.  EKGs/Labs/Other Studies Reviewed:    The following studies were reviewed today:  EKG:   SR rate 95 WNL 07/28/20: Sinus 75 LAD  NonCardiac CT: Date: 02/25/2020 Results:Distal LAD and D1 calcium, aortic arch atherosclerosis  Recent Labs: 03/08/2021: ALT 13; BUN 18; Creatinine,  Ser 1.08; Potassium 3.8; Sodium 141; TSH 2.83  Recent Lipid Panel    Component Value Date/Time   CHOL 153 03/08/2021 1508   CHOL 132 08/31/2020 1437   TRIG 120.0 03/08/2021 1508   HDL 42.60 03/08/2021 1508   HDL 43 08/31/2020 1437   CHOLHDL 4 03/08/2021 1508   VLDL 24.0 03/08/2021 1508   LDLCALC 87 03/08/2021 1508   LDLCALC 69 08/31/2020 1437   LDLDIRECT 94.0 10/05/2016 1728    Physical Exam:    VS:  BP 112/70   Pulse 95   Ht 6' (1.829 m)   Wt 207 lb (93.9 kg)   SpO2 96%   BMI 28.07 kg/m     Wt Readings from Last 3 Encounters:  08/08/21 207 lb (93.9 kg)  07/26/21 211 lb 3.2 oz (95.8 kg)  04/19/21 209 lb 9.6 oz (95.1 kg)    Gen: no distress   Neck: No JVD Cardiac: No Rubs or Gallops, no Murmur, regular rate with +2 radial pulses Respiratory: Clear to auscultation bilaterally, normal effort, normal  respiratory rate GI: Soft, nontender, non-distended  MS: No  edema;  moves all extremities Integument: Skin feels  Neuro:  At time of evaluation, alert and oriented to person/place/time/situation  Psych: Normal affect, patient feels well   ASSESSMENT:    1. Aortic atherosclerosis ([place])   2. Hyperlipidemia associated with type 2 diabetes mellitus ([place])   3. Coronary artery calcification seen on CAT scan   4. Chronic obstructive pulmonary disease, unspecified COPD type ([place])     PLAN:    Aortic atherosclerosis Coronary Artery Calcification Hyperlipidemia (mixed) Erectile Dysfunction COPD -LDL goal less than 55 (increased from prior); will recheck lipids -continue current statin but will likely increase to rosuvastation 5 - dietary constraints are limited given his care of his wife - low threshold for CCTA  Diabetes with hypertension - ambulatory blood pressure at goal - continue home medications   Would be reasonable for me or APP Follow up; one year    Medication Adjustments/Labs and Tests Ordered: Current medicines  are reviewed at length with the patient  today.  Concerns regarding medicines are outlined above.  Orders Placed This Encounter  Procedures   Lipid panel   ALT   EKG 12-Lead    No orders of the defined types were placed in this encounter.   Patient Instructions  Medication Instructions:  Your physician recommends that you continue on your current medications as directed. Please refer to the Current Medication list given to you today.  *If you need a refill on your cardiac medications before your next appointment, please call your pharmacy*   Lab Work: TODAY: FLP, ALT If you have labs (blood work) drawn today and your tests are completely normal, you will receive your results only by: [place] (if you have MyChart) OR A paper copy in the mail If you have any lab test that is abnormal or we need to change your treatment, we will call you to review the results.   Testing/Procedures: NONE   Follow-Up: At [facility], you and your health needs are our priority.  As part of our continuing mission to provide you with exceptional heart care, we have created designated Provider Care Teams.  These Care Teams include your primary Cardiologist (physician) and Advanced Practice Providers (APPs -  Physician Assistants and Nurse Practitioners) who all work together to provide you with the care you need, when you need it.  We recommend signing up for the patient portal called "MyChart".  Sign up information is provided on this After Visit Summary.  MyChart is used to connect with patients for Virtual Visits (Telemedicine).  Patients are able to view lab/test results, encounter notes, upcoming appointments, etc.  Non-urgent messages can be sent to your provider as well.   To learn more about what you can do with MyChart, go to [email or web address].    Your next appointment:   12 month(s)  The format for your next appointment:   In Person  Provider:   Werner Lean, MD           Signed, Werner Lean, MD  08/08/2021 3:51 PM    [place]

## 2021-08-08 ENCOUNTER — Other Ambulatory Visit: Payer: Self-pay

## 2021-08-08 ENCOUNTER — Ambulatory Visit: Payer: Medicare HMO | Admitting: Internal Medicine

## 2021-08-08 ENCOUNTER — Encounter: Payer: Self-pay | Admitting: Internal Medicine

## 2021-08-08 VITALS — BP 112/70 | HR 95 | Ht 72.0 in | Wt 207.0 lb

## 2021-08-08 DIAGNOSIS — J449 Chronic obstructive pulmonary disease, unspecified: Secondary | ICD-10-CM

## 2021-08-08 DIAGNOSIS — E785 Hyperlipidemia, unspecified: Secondary | ICD-10-CM

## 2021-08-08 DIAGNOSIS — I2510 Atherosclerotic heart disease of native coronary artery without angina pectoris: Secondary | ICD-10-CM

## 2021-08-08 DIAGNOSIS — E1169 Type 2 diabetes mellitus with other specified complication: Secondary | ICD-10-CM | POA: Diagnosis not present

## 2021-08-08 DIAGNOSIS — I700 Atherosclerosis of aorta: Secondary | ICD-10-CM

## 2021-08-08 NOTE — Patient Instructions (Signed)
Medication Instructions:  Your physician recommends that you continue on your current medications as directed. Please refer to the Current Medication list given to you today.  *If you need a refill on your cardiac medications before your next appointment, please call your pharmacy*   Lab Work: TODAY: FLP, ALT If you have labs (blood work) drawn today and your tests are completely normal, you will receive your results only by: [place] (if you have MyChart) OR A paper copy in the mail If you have any lab test that is abnormal or we need to change your treatment, we will call you to review the results.   Testing/Procedures: NONE   Follow-Up: At [facility], you and your health needs are our priority.  As part of our continuing mission to provide you with exceptional heart care, we have created designated Provider Care Teams.  These Care Teams include your primary Cardiologist (physician) and Advanced Practice Providers (APPs -  Physician Assistants and Nurse Practitioners) who all work together to provide you with the care you need, when you need it.  We recommend signing up for the patient portal called "MyChart".  Sign up information is provided on this After Visit Summary.  MyChart is used to connect with patients for Virtual Visits (Telemedicine).  Patients are able to view lab/test results, encounter notes, upcoming appointments, etc.  Non-urgent messages can be sent to your provider as well.   To learn more about what you can do with MyChart, go to [email or web address].    Your next appointment:   12 month(s)  The format for your next appointment:   In Person  Provider:   Werner Lean, MD

## 2021-08-09 LAB — ALT: ALT: 14 IU/L (ref 0–44)

## 2021-08-09 LAB — LIPID PANEL
Chol/HDL Ratio: 3 ratio (ref 0.0–5.0)
Cholesterol, Total: 127 mg/dL (ref 100–199)
HDL: 43 mg/dL (ref >39)
LDL Chol Calc (NIH): 55 mg/dL (ref 0–99)
Triglycerides: 172 mg/dL — ABNORMAL HIGH (ref 0–149)
VLDL Cholesterol Cal: 29 mg/dL (ref 5–40)

## 2021-08-09 NOTE — Telephone Encounter (Signed)
Attempted to reach patient to discuss medications past due for refill. Left voicemail for return call.  Debbora Dus, PharmD Clinical Pharmacist Practitioner [facility] at [facility] [phone]

## 2021-08-09 NOTE — Telephone Encounter (Signed)
[place], could you call and transfer [facility]. Simvastatin and olmesartan/HCTZ were sent there by mistake. No rush, Thank you.

## 2021-08-11 NOTE — Telephone Encounter (Signed)
[facility] and olmesartan to [facility] by Avel Sensor, CMA.

## 2021-08-12 ENCOUNTER — Telehealth: Payer: Medicare HMO

## 2021-08-15 ENCOUNTER — Ambulatory Visit: Payer: Medicare HMO | Admitting: Family Medicine

## 2021-08-24 DIAGNOSIS — E113291 Type 2 diabetes mellitus with mild nonproliferative diabetic retinopathy without macular edema, right eye: Secondary | ICD-10-CM | POA: Diagnosis not present

## 2021-08-24 DIAGNOSIS — Z794 Long term (current) use of insulin: Secondary | ICD-10-CM | POA: Diagnosis not present

## 2021-09-05 ENCOUNTER — Other Ambulatory Visit: Payer: Self-pay | Admitting: Family Medicine

## 2021-09-05 NOTE — Telephone Encounter (Signed)
Refill request Cialis Last office visit 03/08/21 Upcoming appointment 09/20/21 Last refill 07/06/21 #5/5

## 2021-09-06 ENCOUNTER — Telehealth: Payer: Self-pay

## 2021-09-06 NOTE — Progress Notes (Addendum)
Chronic Care Management Pharmacy Assistant   Name: Anthony [name]  MRN: [id number] DOB: [birth year]  Reason for Encounter: CCM (Medication Adherence and Delivery Coordination)   Recent office visits:  None since last CCM contact  Recent consult visits:  08/08/2021 - Cardiology - Patient presented for Aortic Atherosclerosis. Labs: Lipid, ALT and EKG. No medication changes.   Hospital visits:  None in previous 6 months  Medications: Outpatient Encounter Medications as of 09/06/2021  Medication Sig   amLODipine (NORVASC) 5 MG tablet TAKE ONE TABLET BY MOUTH ONCE DAILY   b complex vitamins tablet Take 1 tablet by mouth daily.   blood glucose meter kit and supplies KIT Dispense based on patient and insurance preference. Use up to four times daily as directed.   Continuous Blood Gluc Transmit (DEXCOM G6 TRANSMITTER) MISC 1 Device by Does not apply route every 3 (three) months.   Dextrose, Diabetic Use, (GLUCOSE PO) Take 1 tablet by mouth as needed.   glucose blood (ONETOUCH ULTRA) test strip USE AS DIRECTED FOUR TIMES DAILY Dx code E11.65   GVOKE HYPOPEN 1-PACK 1 MG/0.2ML SOAJ Inject under skin 0.2 mL as needed for hypoglycemia   insulin degludec (TRESIBA FLEXTOUCH) 200 UNIT/ML FlexTouch Pen INJECT 32U into SUBCUTANEOUSLY DAILY   Insulin Pen Needle (ADVOCATE INSULIN PEN NEEDLES) 31G X 5 MM MISC Use 3x a day   metFORMIN (GLUCOPHAGE-XR) 500 MG 24 hr tablet Take 1 tablet (500 mg total) by mouth daily with breakfast.   NOVOLOG FLEXPEN 100 UNIT/ML FlexPen INJECT 10-18 UNITS into THE SKIN THREE TIMES DAILY with meals   olmesartan-hydrochlorothiazide (BENICAR HCT) 40-25 MG tablet TAKE 1 TABLET BY MOUTH EVERY DAY   pantoprazole (PROTONIX) 40 MG tablet TAKE ONE TABLET BY MOUTH ONCE DAILY   Semaglutide, 1 MG/DOSE, (OZEMPIC, 1 MG/DOSE,) 4 MG/3ML SOPN Inject 1 mg into the skin once a week.   simvastatin (ZOCOR) 20 MG tablet TAKE 1 TABLET BY MOUTH EVERYDAY AT BEDTIME   tadalafil (CIALIS) 20 MG  tablet TAKE 1/2 TO 1 TABLET BY MOUTH EVERY OTHER DAY AS NEEDED FOR erectile dysfunction   No facility-administered encounter medications on file as of 09/06/2021.   BP Readings from Last 3 Encounters:  08/08/21 112/70  07/26/21 140/80  04/19/21 140/84    Lab Results  Component Value Date   HGBA1C 7.0 (A) 07/26/2021    Recent OV, Consult or Hospital visit:  No medication changes indicated  Last adherence delivery date: 08/17/2021    Patient is due for next adherence delivery on: 09/15/2021  Spoke with patient on 09/06/2021 reviewed medications and coordinated delivery.  This delivery to include: Vials  30 Days  Packs:  VIAL medications: Tresiba Flex 200 units- 32 units once daily in morning  Amlodipine 5 mg 1 tablet daily Pantoprazole   40 mg 1 tablet daily Tadalafil 20 mg - 1/2 to 1 tablet daily as needed- PRN use Novolog Flexpen Inject 10-18 units into the skin 3 times daily with meals Ozempic 1 mg/dose - Inject once weekly  Simvastatin 20 mg Take 1 tablet every day at bedtime Olmesartan/HCTZ 40-25 mg Take 1 tablet by mouth every day  Patient declined the following medications this month: Test strips - Use up to four times daily  Any concerns about your medications? Yes - Patient feels [name] is not making any changes to his blood sugar numbers, he can not see a difference when he takes it.   How often do you forget or accidentally miss a dose? Never  Do you use a pillbox? No  Is patient in packaging No   Refills requested from providers include: Tadalafil 20 mg - 1/2 to 1 tablet daily as needed- PRN use  Confirmed delivery date of 09/15/2021, advised patient that pharmacy will contact them the morning of delivery.  Recent blood pressure readings are as follows: Patient does not take his blood pressure at home.   Recent blood glucose readings are as follows: Patient takes his blood sugar several times a day.   01/07 =  243 before breakfast 283 at lunch 300  after lunch (took 12 units Novolog) 343 at bedtime (took 12 units Novolog) 01/08 =  277 breakfast (took 32 units Tresiba) 336 two hours later (took 12 units Novolog) 358 lunch (took 12 unit Novolog)   274 bedtime (took 8 units Novolog) 01/09 =  184 before breakfast (took 32 Units Tresiba) 341 to hours after breakfast (took 15 units Novolog)  221 after lunch 167 two hours after lunch 301 bedtime (took 15 units Novolog)  Annual wellness visit in last year? Yes 03/08/2021 Most Recent BP reading: 112/70 on 08/08/2021  If Diabetic: Most recent A1C reading: 7.0 on 07/26/2021 Last eye exam / retinopathy screening: Up to date Last diabetic foot exam: 07/27/2020  Debbora Dus, CPP notified  [name], [place] Assistant [phone]  I have reviewed the care management and care coordination activities outlined in this encounter and I am certifying that I agree with the content of this note. Scheduled CCM visit to discuss diabetes.  Debbora Dus, PharmD Clinical Pharmacist [facility] at [facility] [phone]

## 2021-09-16 ENCOUNTER — Telehealth: Payer: Self-pay | Admitting: Internal Medicine

## 2021-09-16 ENCOUNTER — Ambulatory Visit (INDEPENDENT_AMBULATORY_CARE_PROVIDER_SITE_OTHER): Payer: Medicare HMO | Admitting: Podiatry

## 2021-09-16 ENCOUNTER — Ambulatory Visit: Payer: Medicare HMO

## 2021-09-16 ENCOUNTER — Other Ambulatory Visit: Payer: Self-pay

## 2021-09-16 DIAGNOSIS — M79675 Pain in left toe(s): Secondary | ICD-10-CM

## 2021-09-16 DIAGNOSIS — L84 Corns and callosities: Secondary | ICD-10-CM | POA: Diagnosis not present

## 2021-09-16 DIAGNOSIS — E1142 Type 2 diabetes mellitus with diabetic polyneuropathy: Secondary | ICD-10-CM

## 2021-09-16 DIAGNOSIS — M2011 Hallux valgus (acquired), right foot: Secondary | ICD-10-CM | POA: Diagnosis not present

## 2021-09-16 DIAGNOSIS — M79674 Pain in right toe(s): Secondary | ICD-10-CM | POA: Diagnosis not present

## 2021-09-16 DIAGNOSIS — M2012 Hallux valgus (acquired), left foot: Secondary | ICD-10-CM

## 2021-09-16 DIAGNOSIS — M2042 Other hammer toe(s) (acquired), left foot: Secondary | ICD-10-CM

## 2021-09-16 DIAGNOSIS — M2041 Other hammer toe(s) (acquired), right foot: Secondary | ICD-10-CM | POA: Diagnosis not present

## 2021-09-16 DIAGNOSIS — B351 Tinea unguium: Secondary | ICD-10-CM | POA: Diagnosis not present

## 2021-09-16 DIAGNOSIS — E119 Type 2 diabetes mellitus without complications: Secondary | ICD-10-CM | POA: Diagnosis not present

## 2021-09-16 NOTE — Telephone Encounter (Signed)
Patient is at the front desk - he has brought in a paper for Diabetic Foot Wear - he is asking if Dr Cruzita Lederer can sign it for him today so he can return it [facility]

## 2021-09-17 NOTE — Progress Notes (Signed)
SITUATION Reason for Consult: Evaluation for Prefabricated Diabetic Shoes and Bilateral Custom Diabetic Inserts. Patient / Caregiver Report: Patient would like well fitting shoes  OBJECTIVE DATA: Patient History / Diagnosis:    ICD-10-CM   1. Diabetic peripheral neuropathy associated with type 2 diabetes mellitus ([name])  E11.42     2. Hallux valgus, acquired, bilateral  M20.11    M20.12     3. Acquired hammertoes of both feet  M20.41    M20.42       Current or Previous Devices:   Historical shoe and insert user  In-Person Foot Examination: Ulcers & Callousing:   None and no history  Toe / Foot Deformities:   - Hammertoes - Hallux Valgus   Shoe Size: 40M  ORTHOTIC RECOMMENDATION Recommended Devices: - 1x pair prefabricated PDAC approved diabetic shoes: [id number] 40M - 3x pair custom-to-patient vacuum formed diabetic insoles.   GOALS OF SHOES AND INSOLES - Reduce shear and pressure - Reduce / Prevent callus formation - Reduce / Prevent ulceration - Protect the fragile healing compromised diabetic foot.  Patient would benefit from diabetic shoes and inserts as patient has diabetes mellitus and the patient has one or more of the following conditions: - History of partial or complete amputation of the foot - History of previous foot ulceration. - History of pre-ulcerative callus - Peripheral neuropathy with evidence of callus formation - Foot deformity - Poor circulation  ACTIONS PERFORMED Patient was casted for insoles via crush box and measured for shoes via Ostenson device. Procedure was explained and patient tolerated procedure well. All questions were answered and concerns addressed.  PLAN Patient is to ensure treating physician receives and completes diabetic paperwork. Casts and shoe order are to be held until paperwork is received. Once received patient is to be scheduled for fitting in four weeks.

## 2021-09-20 ENCOUNTER — Ambulatory Visit: Payer: Medicare HMO | Admitting: Family Medicine

## 2021-09-23 ENCOUNTER — Telehealth: Payer: Self-pay | Admitting: *Deleted

## 2021-09-23 DIAGNOSIS — E113291 Type 2 diabetes mellitus with mild nonproliferative diabetic retinopathy without macular edema, right eye: Secondary | ICD-10-CM | POA: Diagnosis not present

## 2021-09-23 DIAGNOSIS — Z961 Presence of intraocular lens: Secondary | ICD-10-CM | POA: Diagnosis not present

## 2021-09-23 DIAGNOSIS — Z794 Long term (current) use of insulin: Secondary | ICD-10-CM | POA: Diagnosis not present

## 2021-09-23 LAB — HM DIABETES EYE EXAM

## 2021-09-23 NOTE — Chronic Care Management (AMB) (Signed)
°  Care Management   Note  09/23/2021 Name: [name] MRN: [id number] DOB: 1945/10/01  SALOMON GANSER is a 76 y.o. year old male who is a primary care patient of [name], MD and is actively engaged with the care management team. I reached out to [name] by phone today to assist with re-scheduling a follow up visit with the RN Case Manager  Follow up plan: Unsuccessful telephone outreach attempt made. A HIPAA compliant phone message was left for the patient providing contact information and requesting a return call.   Julian Hy, [place] Management  Direct Dial: [phone]

## 2021-09-24 ENCOUNTER — Encounter: Payer: Self-pay | Admitting: Podiatry

## 2021-09-24 NOTE — Progress Notes (Signed)
ANNUAL DIABETIC FOOT EXAM  Subjective: Anthony [name] presents today for for annual diabetic foot examination.  Patient relates 5 year h/o diabetes.  Patient denies any h/o foot wounds.  Patient denies any numbness, tingling, burning, or pins/needle sensation in feet.  Patient's blood sugar was 196 mg/dl in office today via Dex Com CGM.   Risk factors:  former smoker, COPD, diabetes, hyperlipidemia.  [name], MD is patient's PCP. Last visit was 06/27/2021.  Past Medical History:  Diagnosis Date   Anxiety    denies   Arthritis    a little   Barrett's esophagus 12/08/2015   By EGD 2010 [name])    BPH with obstruction/lower urinary tract symptoms 2014   s/p TURP   Depression    denies   DKA (diabetic ketoacidoses) 01/2010   "in coma"   GERD (gastroesophageal reflux disease)    H/O bronchitis    after every cold   HTN (hypertension)    Hyperlipidemia    controlled with medicine   Otitis media    Rheumatic fever    maybe as child   Type 2 diabetes, uncontrolled, with mild 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 macular edema    completed DMSE   Patient Active Problem List   Diagnosis Date Noted   Type 2 diabetes mellitus with hypoglycemia ([place]) 03/10/2021   Memory impairment 02/10/2021   Non-adherence to medical treatment 10/27/2020   COPD (chronic obstructive pulmonary disease) ([place]) 02/28/2020   Coronary artery calcification seen on CAT scan 02/28/2020   Aortic atherosclerosis ([place]) 02/28/2020   Ex-smoker 01/21/2020   RLQ abdominal pain 01/21/2020   Numbness and tingling in left arm 01/21/2020   Health maintenance examination 01/05/2017   Right knee pain 01/18/2016   Barrett's esophagus 12/08/2015   Abnormal tympanic membrane of right ear 07/05/2015   Advanced care planning/counseling discussion 06/30/2014   Medicare annual wellness visit, subsequent 06/22/2013   Benign prostatic hyperplasia with urinary obstruction 06/18/2013   ED (erectile  dysfunction) 04/10/2012   Hyperlipidemia associated with type 2 diabetes mellitus ([place]) 12/29/2008   LEG PAIN, BILATERAL 11/19/2008   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 ([place]) 07/24/2007   ANXIETY 07/24/2007   GERD 07/24/2007   Past Surgical History:  Procedure Laterality Date   ABI  12/2013   WNL   APPENDECTOMY     CATARACT EXTRACTION W/PHACO Right 12/01/2020   Procedure: CATARACT EXTRACTION PHACO AND INTRAOCULAR LENS PLACEMENT ([place])  RIGHT VIVITY TORIC LENS DIABETIC;  Surgeon: Leandrew Koyanagi, MD;  Location: [facility];  Service: Ophthalmology;  Laterality: Right;  6.93 1:12.8 9.5%   CATARACT EXTRACTION W/PHACO Left 12/15/2020   Procedure: CATARACT EXTRACTION PHACO AND INTRAOCULAR LENS PLACEMENT ([place])  VIVITY TORIC LENS DIABETIC LEFT EYE;  Surgeon: Leandrew Koyanagi, MD;  Location: [facility];  Service: Ophthalmology;  Laterality: Left;  3.47 1:03.8 5.5%   CHOLECYSTECTOMY     years ago   COLONOSCOPY  01/2009   TA, rec rpt 5 yrs [name])   COLONOSCOPY  01/2018   TAx2, diverticulosis, int hemorrhoids, rpt 5 yrs ([name])   ESOPHAGOGASTRODUODENOSCOPY  01/2009   biopsy with Barrett's   ESOPHAGOGASTRODUODENOSCOPY  01/2018   WNL - no barrett's ([name])   INGUINAL HERNIA REPAIR Bilateral 03/04/2020   Procedure: LAPAROSCOPIC BILATERAL INGUINAL HERNIA REPAIR WITH MESH;  Surgeon: [name], MD;  Location: [facility];  Service: General;  Laterality: Bilateral;   LAPAROSCOPIC APPENDECTOMY  07/05/2011   Procedure: APPENDECTOMY LAPAROSCOPIC;  Surgeon: [name], MD;  Location: [place];  Service: General;  Laterality: N/A;   MENISCUS REPAIR Right 2018   [name] @ [facility]   TRANSURETHRAL RESECTION OF PROSTATE N/A 07/28/2013   Procedure: TRANSURETHRAL RESECTION OF THE PROSTATE WITH GYRUS INSTRUMENTS;  Surgeon: [name], MD   VASECTOMY     WISDOM TOOTH EXTRACTION     Current Outpatient Medications on File  Prior to Visit  Medication Sig Dispense Refill   amLODipine (NORVASC) 5 MG tablet TAKE ONE TABLET BY MOUTH ONCE DAILY 90 tablet 0   b complex vitamins tablet Take 1 tablet by mouth daily.     blood glucose meter kit and supplies KIT Dispense based on patient and insurance preference. Use up to four times daily as directed. 1 each 0   Continuous Blood Gluc Transmit (DEXCOM G6 TRANSMITTER) MISC 1 Device by Does not apply route every 3 (three) months. 1 each 3   Dextrose, Diabetic Use, (GLUCOSE PO) Take 1 tablet by mouth as needed.     glucose blood (ONETOUCH ULTRA) test strip USE AS DIRECTED FOUR TIMES DAILY Dx code E11.65 150 strip 3   GVOKE HYPOPEN 1-PACK 1 MG/0.2ML SOAJ Inject under skin 0.2 mL as needed for hypoglycemia 0.2 mL 11   insulin degludec (TRESIBA FLEXTOUCH) 200 UNIT/ML FlexTouch Pen INJECT 32U into SUBCUTANEOUSLY DAILY 27 mL 2   Insulin Pen Needle (ADVOCATE INSULIN PEN NEEDLES) 31G X 5 MM MISC Use 3x a day 300 each 3   metFORMIN (GLUCOPHAGE-XR) 500 MG 24 hr tablet Take 1 tablet (500 mg total) by mouth daily with breakfast. 360 tablet 0   NOVOLOG FLEXPEN 100 UNIT/ML FlexPen INJECT 10-18 UNITS into THE SKIN THREE TIMES DAILY with meals 60 mL 0   olmesartan-hydrochlorothiazide (BENICAR HCT) 40-25 MG tablet TAKE 1 TABLET BY MOUTH EVERY DAY 90 tablet 2   pantoprazole (PROTONIX) 40 MG tablet TAKE ONE TABLET BY MOUTH ONCE DAILY 90 tablet 0   Semaglutide, 1 MG/DOSE, (OZEMPIC, 1 MG/DOSE,) 4 MG/3ML SOPN Inject 1 mg into the skin once a week. 9 mL 3   simvastatin (ZOCOR) 20 MG tablet TAKE 1 TABLET BY MOUTH EVERYDAY AT BEDTIME 90 tablet 2   tadalafil (CIALIS) 20 MG tablet TAKE 1/2 TO 1 TABLET BY MOUTH EVERY OTHER DAY AS NEEDED FOR erectile dysfunction 5 tablet 5   No current facility-administered medications on file prior to visit.    No Known Allergies Social History   Occupational History   Not on file  Tobacco Use   Smoking status: Former    Packs/day: 0.30    Years: 50.00    Pack  years: 15.00    Types: Cigarettes    Quit date: 08/29/2011    Years since quitting: 10.0   Smokeless tobacco: Never  Vaping Use   Vaping Use: Former  Substance and Sexual Activity   Alcohol use: Yes    Alcohol/week: 7.0 standard drinks    Types: 7 Standard drinks or equivalent per week    Comment: 1- 2 ounces a night   Drug use: No   Sexual activity: Not Currently   Family History  Problem Relation Age of Onset   Lymphoma Mother        Non-hodgkins   Coronary artery disease Mother    Heart disease Mother    Cancer Father    Alcohol abuse Father    Diabetes Brother    Pancreatic cancer Maternal Uncle    Diabetes Brother    Diabetes Brother    Diabetes Other  Colon cancer Neg Hx    Lung cancer Neg Hx    Esophageal cancer Neg Hx    Rectal cancer Neg Hx    Stomach cancer Neg Hx    Immunization History  Administered Date(s) Administered   Fluad Quad(high Dose 65+) 06/21/2020   Influenza Whole 06/02/2008, 06/02/2009, 06/02/2010, 07/08/2011, 07/08/2012   Influenza, High Dose Seasonal PF 05/18/2017, 06/25/2018, 04/18/2019, 07/04/2021   Influenza,inj,Quad PF,6+ Mos 07/05/2015   Influenza-Unspecified 05/28/2013, 05/28/2014, 06/30/2016   PFIZER(Purple Top)SARS-COV-2 Vaccination 10/27/2019, 11/25/2019   Pneumococcal Conjugate-13 06/18/2013   Pneumococcal Polysaccharide-23 07/08/2011, 12/29/2016   Tetanus 06/18/2013   Zoster Recombinat (Shingrix) 05/18/2017, 01/14/2018   Zoster, Live 08/26/2014     Review of Systems: Negative except as noted in the HPI.   Objective: There were no vitals filed for this visit.  Anthony [name] is a pleasant 76 y.o. male in NAD. AAO X 3.  Vascular Examination: CFT <3 seconds b/l LE. Palpable DP/PT pulses b/l LE. Digital hair sparse b/l. Skin temperature gradient WNL b/l. No pain with calf compression b/l. No edema noted b/l. No cyanosis or clubbing noted b/l LE.  Dermatological Examination: Pedal skin warm and supple b/l.  No open wounds  b/l. No interdigital macerations. Toenails 1-5 b/l elongated, thickened, discolored with subungual debris. +Tenderness with dorsal palpation of nailplates. Hyperkeratotic lesion(s) noted submet head 1 right foot.   Musculoskeletal Examination: Muscle strength 5/5 to all lower extremity muscle groups bilaterally. HAV with bunion deformity noted b/l LE. Hammertoe deformity noted 2-5 b/l.  Footwear Assessment: Does the patient wear appropriate shoes? Yes. Does the patient need inserts/orthotics? Yes.  Neurological Examination: Protective sensation diminished with 10g monofilament b/l.  Hemoglobin A1C Latest Ref Rng & Units 07/26/2021 02/17/2021 10/27/2020  HGBA1C 4.0 - 5.6 % 7.0(A) 6.9(A) 8.0(A)  Some recent data might be hidden   Assessment: 1. Pain due to onychomycosis of toenails of both feet   2. Callus   3. Hallux valgus, acquired, bilateral   4. Acquired hammertoes of both feet   5. Diabetic peripheral neuropathy associated with type 2 diabetes mellitus ([place])   6. Encounter for diabetic foot exam ([place])     ADA Risk Categorization: High Risk  Patient has one or more of the following: Loss of protective sensation Absent pedal pulses Severe Foot deformity History of foot ulcer  Plan: -Diabetic foot examination performed today. -Continue foot and shoe inspections daily. Monitor blood glucose per PCP/Endocrinologist's recommendations. -Mycotic toenails 1-5 bilaterally were debrided in length and girth with sterile nail nippers and dremel without incident. -Callus(es) submet head 1 right foot pared utilizing sterile scalpel blade without complication or incident. Total number debrided =1. -Patient/POA to call should there be question/concern in the interim.  Return in about 3 months (around 12/15/2021).  Marzetta Board, DPM

## 2021-09-27 ENCOUNTER — Other Ambulatory Visit: Payer: Self-pay

## 2021-09-27 ENCOUNTER — Ambulatory Visit (INDEPENDENT_AMBULATORY_CARE_PROVIDER_SITE_OTHER): Payer: Medicare HMO

## 2021-09-27 DIAGNOSIS — E1169 Type 2 diabetes mellitus with other specified complication: Secondary | ICD-10-CM

## 2021-09-27 DIAGNOSIS — E113299 Type 2 diabetes mellitus with mild nonproliferative diabetic retinopathy without macular edema, unspecified eye: Secondary | ICD-10-CM

## 2021-09-27 DIAGNOSIS — Z7984 Long term (current) use of oral hypoglycemic drugs: Secondary | ICD-10-CM | POA: Diagnosis not present

## 2021-09-27 DIAGNOSIS — Z794 Long term (current) use of insulin: Secondary | ICD-10-CM

## 2021-09-27 DIAGNOSIS — I10 Essential (primary) hypertension: Secondary | ICD-10-CM | POA: Diagnosis not present

## 2021-09-27 NOTE — Progress Notes (Signed)
Chronic Care Management Pharmacy Note  09/27/2021 Name:  Anthony [name] MRN:  [id number] DOB:  [birth year]  Summary: CCM 6 month follow up. Discussed diabetes complicated by h/o severe hypoglycemia and memory impairment. Followed by endocrinology. Reports he missed last 2 PCP visits due to not able to find location, plans to reschedule. Pt checks BG 4x daily with Dexcom. Reports his numbers have been more stable. No recent severe hypoglycemia (< 54). Able to state Tresiba dose 32 units every morning without prompting and reports he has been taking it regularly. Congratulated and encouraged continued daily use. He takes Novolog after meals. Reminded him to take Novolog 15 minutes before unless < 90, then take at start of meal. Continues metformin 500 mg daily due to GI upset with higher doses. HTN stable. Using [facility], med sync program. No gaps in adherence.  Recommendations: Continue to follow up with endocrinology and reschedule PCP visit  Plan: CCM PharmD follow up 6 months  [facility] coordination every 30 days  Subjective: Anthony [name] is an 76 y.o. year old male who is a primary patient of [name], MD.  The CCM team was consulted for assistance with disease management and care coordination needs.    Engaged with patient by telephone for follow up visit in response to provider referral for pharmacy case management and/or care coordination services.   Consent to Services:  The patient was given information about Chronic Care Management services, agreed to services, and gave verbal consent prior to initiation of services.  Please see initial visit note for detailed documentation.   Patient Care Team: [name], MD as PCP - General (Family Medicine) Werner Lean, MD as PCP - Cardiology (Cardiology) [name], MD (Neurosurgery) [name], MD as Consulting Physician (General Surgery) [name], MD (Inactive) as Consulting  Physician (Urology) Thelma Comp, [place] as Consulting Physician (Optometry) Debbora Dus, [facility] as Pharmacist (Pharmacist) Dannielle Karvonen, RN as [place] Management  Office Visits: 03/08/21 Danise Mina, MD, PCP - Pt presented for AWV. Follow up 3-4 months for formal memory assessment.   Consult Visit:  08/08/21 - Cardiology - Pt presented for follow up. LDL improved with LDL 55 (this was not fasting - goal < 55). Continue current medications. 07/26/21 - [name], Endocrinology - Patient presented for follow up diabetes, complicated by dementia, on basal-bolus insulin regimen and also metformin and weekly GLP-1 receptor agonist. Pt is unclear about his Tresiba dose (22 versus 32 units daily, skips if BG 110 at night). Using Dexcom. I did advise him at this visit to move [name] in the morning since in that case, he will most likely end up taking it as sugars are high at that time.  I also again advised him to set an alarm daily to take [name].  I also advised him to use 32 units.  Regarding NovoLog, I again strongly advised him to always take it 15 minutes before the meal unless the sugars are down to the 90s, in which case, he needs to take it at the start of the meal. A1c 7%, appears to be inaccurate.  Hospital visits: None in previous 6 months  Objective:  Lab Results  Component Value Date   CREATININE 1.08 03/08/2021   BUN 18 03/08/2021   GFR 68.45 03/08/2021   GFRNONAA >60 03/02/2020   GFRAA >60 03/02/2020   NA 141 03/08/2021   K 3.8 03/08/2021   CALCIUM 9.7 03/08/2021   CO2 31 03/08/2021  GLUCOSE 129 (H) 03/08/2021    Lab Results  Component Value Date/Time   HGBA1C 7.0 (A) 07/26/2021 11:09 AM   HGBA1C 6.9 (A) 02/17/2021 04:22 PM   HGBA1C 7.7 (H) 03/02/2020 10:26 AM   HGBA1C 7.8 (H) 01/12/2020 10:21 AM   GFR 68.45 03/08/2021 03:08 PM   GFR 81.99 01/12/2020 10:21 AM   MICROALBUR 6.3 (H) 06/28/2015 09:39 AM   MICROALBUR 4.4 (H)  06/26/2014 09:05 AM    Last diabetic Eye exam:  Lab Results  Component Value Date/Time   HMDIABEYEEXA No Retinopathy 10/12/2020 12:00 AM   HMDIABEYEEXA No Retinopathy 10/12/2020 12:00 AM    Last diabetic Foot exam:  Lab Results  Component Value Date/Time   HMDIABFOOTEX done by podiatry - in chart 05/19/2019 12:00 AM     Lab Results  Component Value Date   CHOL 127 08/08/2021   HDL 43 08/08/2021   LDLCALC 55 08/08/2021   LDLDIRECT 94.0 10/05/2016   TRIG 172 (H) 08/08/2021   CHOLHDL 3.0 08/08/2021    Hepatic Function Latest Ref Rng & Units 08/08/2021 03/08/2021 01/12/2020  Total Protein 6.0 - 8.3 g/dL - 6.7 6.0  Albumin 3.5 - 5.2 g/dL - 4.5 4.1  AST 0 - 37 U/L - 12 15  ALT 0 - 44 IU/L _0 Alk Phosphatase 39 - 117 U/L - 99 93  Total Bilirubin 0.2 - 1.2 mg/dL - 1.0 0.8  Bilirubin, Direct 0.0 - 0.3 mg/dL - - -   CBC Latest Ref Rng & Units 03/02/2020 01/05/2017 07/21/2013  WBC 4.0 - 10.5 K/uL 8.5 5.7 6.1  Hemoglobin 13.0 - 17.0 g/dL 14.5 14.2 15.5  Hematocrit 39.0 - 52.0 % 42.8 41.3 43.6  Platelets 150 - 400 K/uL 227 209.0 204    No results found for: VD25OH  Clinical ASCVD: Yes  The ASCVD Risk score (Arnett DK, et al., 2019) failed to calculate for the following reasons:   The valid total cholesterol range is 130 to 320 mg/dL    Depression screen [facility] 2/9 02/16/2021 02/15/2021 01/12/2020  Decreased Interest 0 0 0  Down, Depressed, Hopeless 0 0 0  PHQ - 2 Score 0 0 0  Altered sleeping - - 0  Tired, decreased energy - - 0  Change in appetite - - 0  Feeling bad or failure about yourself  - - 0  Trouble concentrating - - 0  Moving slowly or fidgety/restless - - 0  Suicidal thoughts - - 0  PHQ-9 Score - - 0  Difficult doing work/chores - - Not difficult at all  Some recent data might be hidden      Social History   Tobacco Use  Smoking Status Former   Packs/day: 0.30   Years: 50.00   Pack years: 15.00   Types: Cigarettes   Quit date: 08/29/2011   Years since  quitting: 10.0  Smokeless Tobacco Never   BP Readings from Last 3 Encounters:  08/08/21 112/70  07/26/21 140/80  04/19/21 140/84   Pulse Readings from Last 3 Encounters:  08/08/21 95  07/26/21 69  04/19/21 74   Wt Readings from Last 3 Encounters:  08/08/21 207 lb (93.9 kg)  07/26/21 211 lb 3.2 oz (95.8 kg)  04/19/21 209 lb 9.6 oz (95.1 kg)   BMI Readings from Last 3 Encounters:  08/08/21 28.07 kg/m  07/26/21 28.64 kg/m  04/19/21 28.43 kg/m    Assessment/Interventions: Review of patient past medical history, allergies, medications, health status, including review of consultants reports, laboratory and other  test data, was performed as part of comprehensive evaluation and provision of chronic care management services.   SDOH:  (Social Determinants of Health) assessments and interventions performed: No   SDOH Screenings   Alcohol Screen: Not on file  Depression (PHQ2-9): Low Risk    PHQ-2 Score: 0  Financial Resource Strain: Medium Risk   Difficulty of Paying Living Expenses: Somewhat hard  Food Insecurity: No Food Insecurity   Worried About Charity fundraiser in the Last Year: Never true   Ran Out of Food in the Last Year: Never true  Housing: Low Risk    Last Housing Risk Score: 0  Physical Activity: Not on file  Social Connections: Not on file  Stress: Not on file  Tobacco Use: Medium Risk   Smoking Tobacco Use: Former   Smokeless Tobacco Use: Never   Passive Exposure: Not on file  Transportation Needs: No Transportation Needs   Lack of Transportation (Medical): No   Lack of Transportation (Non-Medical): No    CCM Care Plan  No Known Allergies  Medications Reviewed Today     Reviewed by Marzetta Board, DPM (Physician) on 09/24/21 at 0317  Med List Status: <None>   Medication Order Taking? Sig Documenting Provider Last Dose Status Informant  amLODipine (NORVASC) 5 MG tablet [id number] No TAKE ONE TABLET BY MOUTH ONCE DAILY [name], MD  Taking Active   b complex vitamins tablet [id number] No Take 1 tablet by mouth daily. [provider] Taking Active Self  blood glucose meter kit and supplies KIT [id number] No Dispense based on patient and insurance preference. Use up to four times daily as directed. [name], MD Taking Active   Continuous Blood Gluc Transmit (DEXCOM G6 TRANSMITTER) MISC [id number] No 1 Device by Does not apply route every 3 (three) months. [name], MD Taking Active   Dextrose, Diabetic Use, (GLUCOSE PO) [id number] No Take 1 tablet by mouth as needed. [provider] Taking Active Self  glucose blood (ONETOUCH ULTRA) test strip [id number] No USE AS DIRECTED FOUR TIMES DAILY Dx code E11.65 [name], MD Taking Active   GVOKE HYPOPEN 1-PACK 1 MG/0.2ML Darden Palmer [id number] No Inject under skin 0.2 mL as needed for hypoglycemia [name], MD Taking Active   insulin degludec (TRESIBA FLEXTOUCH) 200 UNIT/ML FlexTouch Pen [id number] No INJECT 32U into SUBCUTANEOUSLY DAILY [name], MD Taking Active   Insulin Pen Needle (ADVOCATE INSULIN PEN NEEDLES) 31G X 5 MM MISC [id number] No Use 3x a day [name], MD Taking Active Self  metFORMIN (GLUCOPHAGE-XR) 500 MG 24 hr tablet [id number] No Take 1 tablet (500 mg total) by mouth daily with breakfast. [name], MD Taking Active   NOVOLOG FLEXPEN 100 UNIT/ML FlexPen [id number] No INJECT 10-18 UNITS into THE SKIN THREE TIMES DAILY with meals [name], MD Taking Active   olmesartan-hydrochlorothiazide [facility] HCT) 40-25 MG tablet [id number] No TAKE 1 TABLET BY MOUTH EVERY DAY [name], MD Taking Active   pantoprazole (PROTONIX) 40 MG tablet [id number] No TAKE ONE TABLET BY MOUTH ONCE DAILY [name], MD Taking Active   Semaglutide, 1 MG/DOSE, (OZEMPIC, 1 MG/DOSE,) 4 MG/3ML SOPN [id number] No Inject 1 mg into the skin once a week. [name], MD Taking Active   simvastatin (ZOCOR) 20 MG tablet  [id number] No TAKE 1 TABLET BY MOUTH EVERYDAY AT BEDTIME [name], MD Taking Active   tadalafil (CIALIS) 20 MG tablet [id number]  TAKE 1/2 TO 1 TABLET BY MOUTH EVERY OTHER DAY AS NEEDED FOR erectile  dysfunction [name], MD  Active             Patient Active Problem List   Diagnosis Date Noted   Type 2 diabetes mellitus with hypoglycemia ([place]) 03/10/2021   Memory impairment 02/10/2021   Non-adherence to medical treatment 10/27/2020   COPD (chronic obstructive pulmonary disease) ([place]) 02/28/2020   Coronary artery calcification seen on CAT scan 02/28/2020   Aortic atherosclerosis ([place]) 02/28/2020   Ex-smoker 01/21/2020   RLQ abdominal pain 01/21/2020   Numbness and tingling in left arm 01/21/2020   Health maintenance examination 01/05/2017   Right knee pain 01/18/2016   Barrett's esophagus 12/08/2015   Abnormal tympanic membrane of right ear 07/05/2015   Advanced care planning/counseling discussion 06/30/2014   Medicare annual wellness visit, subsequent 06/22/2013   Benign prostatic hyperplasia with urinary obstruction 06/18/2013   ED (erectile dysfunction) 04/10/2012   Hyperlipidemia associated with type 2 diabetes mellitus ([place]) 12/29/2008   LEG PAIN, BILATERAL 11/19/2008   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 ([place]) 07/24/2007   ANXIETY 07/24/2007   GERD 07/24/2007    Immunization History  Administered Date(s) Administered   Fluad Quad(high Dose 65+) 06/21/2020   Influenza Whole 06/02/2008, 06/02/2009, 06/02/2010, 07/08/2011, 07/08/2012   Influenza, High Dose Seasonal PF 05/18/2017, 06/25/2018, 04/18/2019, 07/04/2021   Influenza,inj,Quad PF,6+ Mos 07/05/2015   Influenza-Unspecified 05/28/2013, 05/28/2014, 06/30/2016   PFIZER(Purple Top)SARS-COV-2 Vaccination 10/27/2019, 11/25/2019   Pneumococcal Conjugate-13 06/18/2013   Pneumococcal Polysaccharide-23 07/08/2011, 12/29/2016   Tetanus 06/18/2013   Zoster Recombinat  (Shingrix) 05/18/2017, 01/14/2018   Zoster, Live 08/26/2014    Conditions to be addressed/monitored:  Hypertension and Diabetes  Care Plan : [place]  Updates made by Debbora Dus, [place] since 09/27/2021 12:00 AM     Problem: CHL AMB "PATIENT-SPECIFIC PROBLEM"      Long-Range Goal: Disease Management   Start Date: 11/23/2020  This Visit's Progress: On track  Priority: High  Note:    Current Barriers:  Unable to achieve control of diabetes   Pharmacist Clinical Goal(s):  Patient will achieve adherence to monitoring guidelines and medication adherence to achieve therapeutic efficacy through collaboration with PharmD and provider.   Interventions: 1:1 collaboration with [name], MD regarding development and update of comprehensive plan of care as evidenced by provider attestation and co-signature Inter-disciplinary care team collaboration (see longitudinal plan of care) Comprehensive medication review performed; medication list updated in electronic medical record  Diabetes (A1c goal <8%) -Query controlled, he continues to wear Dexcom 24/7, checks BG at least 4 times daily. A1c inaccurate. My personal goal for patient is to avoid hypoglycemia and keep BG < 250 due to memory impairment and h/o severe hypoglycemia. He seems to be doing better from this standpoint. He has improved adherence to [name]. Tolerating medication well. Denies any N/V but does note low appetitie with Ozempic. Current weight 202 lbs. Continues to follow with Dr. Cruzita Lederer. -Current medications: Metformin 500 mg - 1 tablet daily before breakfast - Appropriate, Effective, Safe, Accessible Insulin aspart (NovoLog) - 08-11-17 units (sliding scale based on meal size), 15 minutes before meals  - Appropriate, Effective, Query Safe (due to hypoglycemia risk. In the past we tried to stop Novolog and replace with Ozempic. Pt resumed Novolog per PCP after 1 month trial off due to high post-prandials. Pt  was not taking his [name] routinely at the time.) Tresiba - 32 units once daily every morning Appropriate, Effective, Safe, Accessible Ozempic 1 mg - Inject once weekly on Mondays Appropriate,  Effective, Safe, Query accessible (Cost is concern in coverage gap, pt did not proceed with PAP last year per his preference)  -Medications previously tried: upset stomach on higher dose metformin  -Current meal patterns: eats 2 meals/day (late breakfast ~11 AM, early dinner ~ 5 PM) -Current exercise: tries to walk when he can -Educated on: appropriate admin time for Novolog -Plan: Continue current medications   Hypertension (BP goal < 140/90) Controlled - per clinic readings within goal. Patient does not monitor BP at home. Affirms daily adherence. Current medications: Amlodipine 5 mg - 1 tablet daily (1 breakfast) - Appropriate, Effective, Safe, Accessible Olmesartan-hydrochlorothiazide 40-25 mg - 1 tablet daily (1 breakfast) - Appropriate, Effective, Safe, Accessible Recommended continue current medications  Patient Goals/Self-Care Activities Patient will:  - take medications as prescribed - monitor glucose with Dexcom daily, document, and provide at future appointments  Follow Up Plan: CCM PharmD follow up 6 months  [facility] coordination every 30 days      Medication Assistance: No cost concerns identified. Patient was unable to afford Ozempic during coverage gap of 2022. He did not complete PAP forms (due to income above limits) and was off med until beginning of 2023.  Patient's preferred pharmacy is:  [facility] - [place], [place] - [address] [place] [address] Phone: [phone] Fax: [phone]  [id number] [place] [place] [id number]: [phone]: [phone] Uses pill box? Yes  Endorses 100% compliance His medications are synced  as of 01/20/21 Easy open caps, vials  [facility] Medication Reviewed: Filled 09/12/21 30 DS Amlodipine 5 mg - 1 tablet daily (1 breakfast) Olmesartan-hydrochlorothiazide 40-25 mg - 1 tablet daily (1 breakfast) Pantoprazole 40 mg - 1 tablet daily (1 breakfast) Simvastatin 20 mg - 1 tablet daily (1 breakfast) TTyler Aas- Inject 30 units at bedtime Novolog - Inject 12-16 units before meals three times daily Tadalafil 20 mg - takes as needed  Past due for refill: Metformin 500 mg XR - 1 tablet daily (1 breakfast - reports he has adequate supply although not refilled in 2023, discussed tossing expired medications and let [place]now when refill is needed) Ozempic 1 mg - Inject once weekly on Monday (Last filled 06/10/21, 30 DS. Reports he has 1 box on hand + 1 pen as of 09/27/21)  Care Plan and Follow Up Patient Decision:  Patient agrees to Care Plan and Follow-up.  MDebbora Dus PharmD Clinical Pharmacist [facility] at [phone]

## 2021-09-27 NOTE — Patient Instructions (Signed)
Dear Anthony [name],  Below is a summary of the goals we discussed during our follow up appointment on September 27, 2021. Please contact me anytime with questions or concerns.   Visit Information  Patient Care Plan: CCM Pharmacy Care Plan     Problem Identified: CHL AMB "PATIENT-SPECIFIC PROBLEM"      Long-Range Goal: Disease Management   Start Date: 11/23/2020  This Visit's Progress: On track  Priority: High  Note:    Current Barriers:  Unable to achieve control of diabetes   Pharmacist Clinical Goal(s):  Patient will achieve adherence to monitoring guidelines and medication adherence to achieve therapeutic efficacy through collaboration with PharmD and provider.   Interventions: 1:1 collaboration with [name], MD regarding development and update of comprehensive plan of care as evidenced by provider attestation and co-signature Inter-disciplinary care team collaboration (see longitudinal plan of care) Comprehensive medication review performed; medication list updated in electronic medical record  Diabetes (A1c goal <8%) -Query controlled, he continues to wear Dexcom 24/7, checks BG at least 4 times daily. A1c inaccurate. My personal goal for patient is to avoid hypoglycemia and keep BG < 250 due to memory impairment and h/o severe hypoglycemia. He seems to be doing better from this standpoint. He has improved adherence to [name]. Tolerating medication well. Denies any N/V but does note low appetitie with Ozempic. Current weight 202 lbs. Continues to follow with Dr. Cruzita Lederer. -Current medications: Metformin 500 mg - 1 tablet daily before breakfast - Appropriate, Effective, Safe, Accessible Insulin aspart (NovoLog) - 08-11-17 units (sliding scale based on meal size), 15 minutes before meals  - Appropriate, Effective, Query Safe (due to hypoglycemia risk. In the past we tried to stop Novolog and replace with Ozempic. Pt resumed Novolog per PCP after 1 month trial off due to  high post-prandials. Pt was not taking his [name] routinely at the time.) Tresiba - 32 units once daily every morning Appropriate, Effective, Safe, Accessible Ozempic 1 mg - Inject once weekly on Mondays Appropriate, Effective, Safe, Query accessible (Cost is concern in coverage gap, pt did not proceed with PAP last year per his preference)  -Medications previously tried: upset stomach on higher dose metformin  -Current meal patterns: eats 2 meals/day (late breakfast ~11 AM, early dinner ~ 5 PM) -Current exercise: tries to walk when he can -Educated on: appropriate admin time for Novolog -Plan: Continue current medications   Hypertension (BP goal < 140/90) Controlled - per clinic readings within goal. Patient does not monitor BP at home. Affirms daily adherence. Current medications: Amlodipine 5 mg - 1 tablet daily (1 breakfast) - Appropriate, Effective, Safe, Accessible Olmesartan-hydrochlorothiazide 40-25 mg - 1 tablet daily (1 breakfast) - Appropriate, Effective, Safe, Accessible Recommended continue current medications  Patient Goals/Self-Care Activities Patient will:  - take medications as prescribed - monitor glucose with Dexcom daily, document, and provide at future appointments  Follow Up Plan: CCM PharmD follow up 6 months  [facility] coordination every 30 days     The patient verbalized understanding of instructions, educational materials, and care plan provided today and declined offer to receive copy of patient instructions, educational materials, and care plan.   Debbora Dus, PharmD Clinical Pharmacist Practitioner [facility] at [facility] [phone]

## 2021-09-29 ENCOUNTER — Telehealth: Payer: Medicare HMO

## 2021-10-05 ENCOUNTER — Telehealth: Payer: Self-pay

## 2021-10-05 NOTE — Progress Notes (Addendum)
Chronic Care Management Pharmacy Assistant   Name: Anthony [name]  MRN: [id number] DOB: 1945/12/01  Reason for Encounter: CCM (Medication Adherence and Delivery Coordination)   Recent office visits:  None since last CCM contact  Recent consult visits:  None since last CCM contact  Hospital visits:  None in previous 6 months  Medications: Outpatient Encounter Medications as of 10/05/2021  Medication Sig   amLODipine (NORVASC) 5 MG tablet TAKE ONE TABLET BY MOUTH ONCE DAILY   b complex vitamins tablet Take 1 tablet by mouth daily.   blood glucose meter kit and supplies KIT Dispense based on patient and insurance preference. Use up to four times daily as directed.   Continuous Blood Gluc Transmit (DEXCOM G6 TRANSMITTER) MISC 1 Device by Does not apply route every 3 (three) months.   Dextrose, Diabetic Use, (GLUCOSE PO) Take 1 tablet by mouth as needed.   glucose blood (ONETOUCH ULTRA) test strip USE AS DIRECTED FOUR TIMES DAILY Dx code E11.65   GVOKE HYPOPEN 1-PACK 1 MG/0.2ML SOAJ Inject under skin 0.2 mL as needed for hypoglycemia (Patient not taking: Reported on 09/27/2021)   insulin degludec (TRESIBA FLEXTOUCH) 200 UNIT/ML FlexTouch Pen INJECT 32U into SUBCUTANEOUSLY DAILY   Insulin Pen Needle (ADVOCATE INSULIN PEN NEEDLES) 31G X 5 MM MISC Use 3x a day   metFORMIN (GLUCOPHAGE-XR) 500 MG 24 hr tablet Take 1 tablet (500 mg total) by mouth daily with breakfast.   NOVOLOG FLEXPEN 100 UNIT/ML FlexPen INJECT 10-18 UNITS into THE SKIN THREE TIMES DAILY with meals   olmesartan-hydrochlorothiazide (BENICAR HCT) 40-25 MG tablet TAKE 1 TABLET BY MOUTH EVERY DAY   pantoprazole (PROTONIX) 40 MG tablet TAKE ONE TABLET BY MOUTH ONCE DAILY   Semaglutide, 1 MG/DOSE, (OZEMPIC, 1 MG/DOSE,) 4 MG/3ML SOPN Inject 1 mg into the skin once a week.   simvastatin (ZOCOR) 20 MG tablet TAKE 1 TABLET BY MOUTH EVERYDAY AT BEDTIME   tadalafil (CIALIS) 20 MG tablet TAKE 1/2 TO 1 TABLET BY MOUTH EVERY OTHER DAY  AS NEEDED FOR erectile dysfunction   No facility-administered encounter medications on file as of 10/05/2021.   BP Readings from Last 3 Encounters:  08/08/21 112/70  07/26/21 140/80  04/19/21 140/84    Lab Results  Component Value Date   HGBA1C 7.0 (A) 07/26/2021    No OVs, Consults, or hospital visits since last care coordination call / Pharmacist visit. No medication changes indicated  Last adherence delivery date: 09/15/2021      Patient is due for next adherence delivery on: 10/17/2021  Spoke with patient on 10/06/2021 reviewed medications and coordinated delivery.  This delivery to include: Vials  30 Days  Packs:  VIAL medications: Tresiba Flex 200 units- 32 units once daily in morning  Amlodipine 5 mg 1 tablet daily Pantoprazole   40 mg 1 tablet daily Tadalafil 20 mg - 1/2 to 1 tablet daily as needed- PRN use Olmesartan/HCTZ 40-25 mg Take 1 tablet by mouth every day Simvastatin 20 mg Take 1 tablet every day at bedtime Novolog Flexpen Inject 10-18 units into the skin 3 times daily with meals   Patient declined the following medications this month: OneTouch Test strips - Use up to four times daily (has plenty - uses Dexcom) Metformin 500 mg ER - take 1 tablet every morning (pt has excess supply) Ozempic 1 mg/dose - Inject once weekly (delivered 09/28/21 30 days supply)  Any concerns about your medications? Yes, patient was unsure what Ozempic is for and unsure if he  wants to take it due to poor appetite and copay of $45. I offered patient assistance for Ozempic again, but patient wasn't sure as he has the money he just doesn't know if he wants to spend it.   How often do you forget or accidentally miss a dose? Never  Do you use a pillbox? No  Is patient in packaging No   No refill request needed.  Confirmed delivery date of 10/17/2021, advised patient that pharmacy will contact them the morning of delivery.  Recent blood pressure readings are as follows: Patient  does not take blood pressure at home.   Recent blood glucose readings are as follows: Patient stated he does not know how to go back and see his glucose history on his Dexcom. Patient's Dexcom went off while I was on the phone with him. Glucose reading at 10:08 am was 302. Patient stated it was high because he was trying to raise his glucose levels last night. His reading was 53 at 4:00 am on 10/05/2021. Patient ate 2 bananas, took 4 glucose tabs and also drank glucose liquid. Patient states his numbers have been all over the place.   Annual wellness visit in last year? Yes 07/26/2021 Most Recent BP reading: 131/56 on 07/28/2021  If Diabetic: Most recent A1C reading: 5.2 on 07/28/2021 Last eye exam / retinopathy screening: Up to date Last diabetic foot exam: Up to date  Debbora Dus, CPP notified  [name], [place] Assistant [phone]  I have reviewed the care management and care coordination activities outlined in this encounter and I am certifying that I agree with the content of this note. No further action required. See recent CCM visit with patient where we reviewed Ozempic use and he was instructed to continue taking weekly. Reminded patient to avoid over-treatment of hypo/hyperglycemia. Poor memory complicates treatment.  Debbora Dus, PharmD Clinical Pharmacist [facility] [phone]

## 2021-10-17 ENCOUNTER — Other Ambulatory Visit: Payer: Self-pay

## 2021-10-17 ENCOUNTER — Ambulatory Visit (INDEPENDENT_AMBULATORY_CARE_PROVIDER_SITE_OTHER): Payer: Medicare HMO

## 2021-10-17 DIAGNOSIS — M2042 Other hammer toe(s) (acquired), left foot: Secondary | ICD-10-CM

## 2021-10-17 DIAGNOSIS — M2011 Hallux valgus (acquired), right foot: Secondary | ICD-10-CM

## 2021-10-17 DIAGNOSIS — M2041 Other hammer toe(s) (acquired), right foot: Secondary | ICD-10-CM | POA: Diagnosis not present

## 2021-10-17 DIAGNOSIS — M2012 Hallux valgus (acquired), left foot: Secondary | ICD-10-CM | POA: Diagnosis not present

## 2021-10-17 DIAGNOSIS — E1142 Type 2 diabetes mellitus with diabetic polyneuropathy: Secondary | ICD-10-CM | POA: Diagnosis not present

## 2021-10-17 NOTE — Progress Notes (Signed)
SITUATION Reason for Visit: Fitting of Diabetic Shoes & Insoles Patient / Caregiver Report:  Patient is satisfied with fit and function  OBJECTIVE DATA: Patient History / Diagnosis:     ICD-10-CM   1. Diabetic peripheral neuropathy associated with type 2 diabetes mellitus ([name])  E11.42     2. Hallux valgus, acquired, bilateral  M20.11    M20.12     3. Acquired hammertoes of both feet  M20.41    M20.42       Change in Status:   None  ACTIONS PERFORMED: In-Person Delivery, patient was fit with: - 1x pair A5500 PDAC approved prefabricated Diabetic Shoes: Apex V854 [id number] - 3x pair [id number] PDAC approved CAM milled custom diabetic insoles [facility]: [id number]   Shoes and insoles were verified for structural integrity and safety. Patient wore shoes and insoles in office. Skin was inspected and free of areas of concern after wearing shoes and inserts. Shoes and inserts fit properly. Patient / Caregiver provided with ferbal instruction and demonstration regarding donning, doffing, wear, care, proper fit, function, purpose, cleaning, and use of shoes and insoles ' and in all related precautions and risks and benefits regarding shoes and insoles. Patient / Caregiver was instructed to wear properly fitting socks with shoes at all times. Patient was also provided with verbal instruction regarding how to report any failures or malfunctions of shoes or inserts, and necessary follow up care. Patient / Caregiver was also instructed to contact physician regarding change in status that may affect function of shoes and inserts.   Patient / Caregiver verbalized undersatnding of instruction provided. Patient / Caregiver demonstrated independence with proper donning and doffing of shoes and inserts.  PLAN Patient to follow up as needed. Plan of care was discussed with and agreed upon by patient and/or caregiver. All questions were answered and concerns addressed.

## 2021-10-18 NOTE — Chronic Care Management (AMB) (Signed)
°  Care Management   Note  10/18/2021 Name: Anthony [name] MRN: [id number] DOB: 1946-06-15  Anthony [name] is a 76 y.o. year old male who is a primary care patient of [name], MD and is actively engaged with the care management team. I reached out to [name] by phone today to assist with re-scheduling a follow up visit with the RN Case Manager  Follow up plan: Telephone appointment with care management team member scheduled for: 11/01/2021  Julian Hy, [place], [place] Management  Direct Dial: [phone]

## 2021-10-21 ENCOUNTER — Encounter: Payer: Self-pay | Admitting: Internal Medicine

## 2021-10-23 DIAGNOSIS — Z794 Long term (current) use of insulin: Secondary | ICD-10-CM | POA: Diagnosis not present

## 2021-10-23 DIAGNOSIS — E113291 Type 2 diabetes mellitus with mild nonproliferative diabetic retinopathy without macular edema, right eye: Secondary | ICD-10-CM | POA: Diagnosis not present

## 2021-10-24 ENCOUNTER — Encounter: Payer: Self-pay | Admitting: Internal Medicine

## 2021-10-24 ENCOUNTER — Ambulatory Visit (INDEPENDENT_AMBULATORY_CARE_PROVIDER_SITE_OTHER): Payer: Medicare HMO | Admitting: Internal Medicine

## 2021-10-24 ENCOUNTER — Other Ambulatory Visit: Payer: Self-pay

## 2021-10-24 VITALS — BP 128/78 | HR 83 | Ht 72.0 in | Wt 211.0 lb

## 2021-10-24 DIAGNOSIS — E785 Hyperlipidemia, unspecified: Secondary | ICD-10-CM | POA: Diagnosis not present

## 2021-10-24 DIAGNOSIS — E663 Overweight: Secondary | ICD-10-CM

## 2021-10-24 DIAGNOSIS — Z794 Long term (current) use of insulin: Secondary | ICD-10-CM | POA: Diagnosis not present

## 2021-10-24 DIAGNOSIS — E113299 Type 2 diabetes mellitus with mild nonproliferative diabetic retinopathy without macular edema, unspecified eye: Secondary | ICD-10-CM

## 2021-10-24 LAB — POCT GLYCOSYLATED HEMOGLOBIN (HGB A1C): Hemoglobin A1C: 6.8 % — AB (ref 4.0–5.6)

## 2021-10-24 MED ORDER — GVOKE HYPOPEN 1-PACK 1 MG/0.2ML ~~LOC~~ SOAJ: SUBCUTANEOUS | 11 refills | Status: AC

## 2021-10-24 NOTE — Patient Instructions (Addendum)
Please continue: - Metformin ER 500 mg daily - Ozempic 1 mg weekly - Tresiba 32 units daily in the morning - Novolog 15 minutes before each meal: - 12 units before small meals - 15 units before regular meals - 18-20 units before larger meals  If the sugars are <90 before meals, take NovoLog at the start of the meal.  Take the Novolog pen with you when you go out to eat.  Please return in 3 months.
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
## 2021-10-25 ENCOUNTER — Encounter: Payer: Self-pay | Admitting: Family Medicine

## 2021-11-01 ENCOUNTER — Ambulatory Visit (INDEPENDENT_AMBULATORY_CARE_PROVIDER_SITE_OTHER): Payer: Medicare HMO

## 2021-11-01 DIAGNOSIS — R413 Other amnesia: Secondary | ICD-10-CM

## 2021-11-01 DIAGNOSIS — E11649 Type 2 diabetes mellitus with hypoglycemia without coma: Secondary | ICD-10-CM

## 2021-11-01 DIAGNOSIS — I10 Essential (primary) hypertension: Secondary | ICD-10-CM

## 2021-11-01 NOTE — Patient Instructions (Signed)
Visit Information ? ?Thank you for taking time to visit with me today. Please don't hesitate to contact me if I can be of assistance to you before our next scheduled telephone appointment. ? ?Following are the goals we discussed today:  ?Take medications as prescribed ?Follow up with providers as recommended.  ?Call pharmacy / pharmacy programs for medication refills  ?Call provider office for new concerns or questions ?Apply memory impairment strategies:  Socialize regularly, get a good nights sleep, eat a healthy diet, read,  and games such as ( sudoku, crossword puzzles) ?Check blood pressure 2-3 times per month ?Reminder:  Consider having family member accompany you to your appointments and assist with medication reminders ?Continue to check your blood sugar as directed by your provider, take your blood sugar readings to your provider appointment ?Treat low blood sugar according to " the rule of 15" ( keep a snack with you to eat if you know you will be late for lunch) ?  How to treat low blood sugars (Blood sugar less than 70 mg/dl ? Please follow the RULE OF 15 for the treatment of hypoglycemia treatment (When your blood sugars are less than 70 mg/ dl) ?STEP  1:  Take 15 grams of carbohydrates when your blood sugar is low, which includes:  ? 3-4 glucose tabs or ? 3-4 oz of juice or regular soda or ? One tube of glucose gel ?STEP 2:  Recheck blood sugar in 15 minutes ?STEP 3:  If your blood sugar is still low at the 15 minute recheck ---then, go back to STEP 1 and treat again with another 15 grams of carbohydrates ? ?Our next appointment is by telephone on 12/15/21 at 1:00 pm ? ?Please call the care guide team at [phone] if you need to cancel or reschedule your appointment.  ? ?If you are experiencing a Mental Health or [place] or need someone to talk to, please call the Suicide and Crisis Lifeline: 988 ?call 1-800-273-TALK (toll free, 24 hour hotline)  ? ?The patient verbalized  understanding of instructions, educational materials, and care plan provided today and agreed to receive a mailed copy of patient instructions, educational materials, and care plan.  ? ?Quinn Plowman RN,BSN,CCM ?RN Case Manager ?[facility]  ?[phone] ? ?

## 2021-11-01 NOTE — Chronic Care Management (AMB) (Signed)
Chronic Care Management   CCM RN Visit Note  11/01/2021 Name: Anthony [name] MRN: [id number] DOB: 1946-02-17  Subjective: Anthony [name] is a 76 y.o. year old male who is a primary care patient of [name], MD. The care management team was consulted for assistance with disease management and care coordination needs.    Engaged with patient by telephone for follow up visit in response to provider referral for case management and/or care coordination services.   Consent to Services:  The patient was given information about Chronic Care Management services, agreed to services, and gave verbal consent prior to initiation of services.  Please see initial visit note for detailed documentation.   Patient agreed to services and verbal consent obtained.   Assessment: Review of patient past medical history, allergies, medications, health status, including review of consultants reports, laboratory and other test data, was performed as part of comprehensive evaluation and provision of chronic care management services.   SDOH (Social Determinants of Health) assessments and interventions performed:    CCM Care Plan  No Known Allergies  Outpatient Encounter Medications as of 11/01/2021  Medication Sig   amLODipine (NORVASC) 5 MG tablet TAKE ONE TABLET BY MOUTH ONCE DAILY   b complex vitamins tablet Take 1 tablet by mouth daily.   GVOKE HYPOPEN 1-PACK 1 MG/0.2ML SOAJ Inject under skin 0.2 mL as needed for hypoglycemia   insulin degludec (TRESIBA FLEXTOUCH) 200 UNIT/ML FlexTouch Pen INJECT 32U into SUBCUTANEOUSLY DAILY   metFORMIN (GLUCOPHAGE-XR) 500 MG 24 hr tablet Take 1 tablet (500 mg total) by mouth daily with breakfast.   NOVOLOG FLEXPEN 100 UNIT/ML FlexPen INJECT 10-18 UNITS into THE SKIN THREE TIMES DAILY with meals   olmesartan-hydrochlorothiazide (BENICAR HCT) 40-25 MG tablet TAKE 1 TABLET BY MOUTH EVERY DAY   pantoprazole (PROTONIX) 40 MG tablet TAKE ONE TABLET BY MOUTH ONCE  DAILY   Semaglutide, 1 MG/DOSE, (OZEMPIC, 1 MG/DOSE,) 4 MG/3ML SOPN Inject 1 mg into the skin once a week.   simvastatin (ZOCOR) 20 MG tablet TAKE 1 TABLET BY MOUTH EVERYDAY AT BEDTIME   blood glucose meter kit and supplies KIT Dispense based on patient and insurance preference. Use up to four times daily as directed.   Continuous Blood Gluc Transmit (DEXCOM G6 TRANSMITTER) MISC 1 Device by Does not apply route every 3 (three) months.   Dextrose, Diabetic Use, (GLUCOSE PO) Take 1 tablet by mouth as needed.   glucose blood (ONETOUCH ULTRA) test strip USE AS DIRECTED FOUR TIMES DAILY Dx code E11.65   Insulin Pen Needle (ADVOCATE INSULIN PEN NEEDLES) 31G X 5 MM MISC Use 3x a day   tadalafil (CIALIS) 20 MG tablet TAKE 1/2 TO 1 TABLET BY MOUTH EVERY OTHER DAY AS NEEDED FOR erectile dysfunction   No facility-administered encounter medications on file as of 11/01/2021.    Patient Active Problem List   Diagnosis Date Noted   Type 2 diabetes mellitus with hypoglycemia ([place]) 03/10/2021   Memory impairment 02/10/2021   Non-adherence to medical treatment 10/27/2020   COPD (chronic obstructive pulmonary disease) ([place]) 02/28/2020   Coronary artery calcification seen on CAT scan 02/28/2020   Aortic atherosclerosis ([place]) 02/28/2020   Ex-smoker 01/21/2020   RLQ abdominal pain 01/21/2020   Numbness and tingling in left arm 01/21/2020   Health maintenance examination 01/05/2017   Right knee pain 01/18/2016   Barrett's esophagus 12/08/2015   Abnormal tympanic membrane of right ear 07/05/2015   Advanced care planning/counseling discussion 06/30/2014   Medicare annual wellness visit,  subsequent 06/22/2013   Benign prostatic hyperplasia with urinary obstruction 06/18/2013   ED (erectile dysfunction) 04/10/2012   Hyperlipidemia associated with type 2 diabetes mellitus ([place]) 12/29/2008   LEG PAIN, BILATERAL 11/19/2008   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 ([place])  07/24/2007   ANXIETY 07/24/2007   GERD 07/24/2007    Conditions to be addressed/monitored:HTN and DMII  Care Plan : [facility] plan of care  Updates made by Dannielle Karvonen, RN since 11/01/2021 12:00 AM     Problem: Chronic disese management education and / or care coordination needs.( Diabetes, Hypertension)   Priority: High     Long-Range Goal: Development of plan of care to address chronic disease management and / or care coordination needs ( DMII, Hypertension)   Expected End Date: 01/25/2022  Priority: High  Note:   Current Barriers:  Knowledge Deficits related to plan of care for management of HTN and DMII  Chronic Disease Management support and education needs related to HTN and DMII Cognitive Deficits Memory impairment Patient reports having follow up visit with his endocrinologist on 10/24/21.  Patient and RNCM reviewed endocrinologist orders for ( medication/ insulin regime) Patient states he is aware that he takes his tresiba in the morning. He reports taking his ozempic 1 x week on Mondays, He reports he takes his Novolog insulin before meals  ( units based on meal size) and he states he takes 1 metformin daily.  Per chart review, next follow up visit with endocrinologist is 01/24/22. Patient states his blood sugar at time of call with RNCM was 105 up from 95.  Patient states he treated his low with 2 10oz apple juices and 2 packs of NABS.  He reports his blood sugar was 118 at end of call.   RNCM Clinical Goal(s):  Patient will verbalize understanding of plan for management of HTN and DMII take all medications exactly as prescribed and will call provider for medication related questions attend all scheduled medical appointments:  continue to work with RN Care Manager to address care management and care coordination needs related to  HTN and DMII through collaboration with RN Care manager, provider, and care team.  patient will demonstrate improved adherence to prescribed treatment plan  for diabetes self care/management as evidenced by: daily monitoring and recording of CBG  adherence to ADA/ carb modified diet adherence to prescribed medication regimen contacting provider for new or worsened symptoms or questions:   Interventions: 1:1 collaboration with primary care provider regarding development and update of comprehensive plan of care as evidenced by provider attestation and co-signature Inter-disciplinary care team collaboration (see longitudinal plan of care) Evaluation of current treatment plan related to  self management and patient's adherence to plan as established by provider  Hypertension Interventions: Goal on track: Yes Last practice recorded BP readings:  BP Readings from Last 3 Encounters:  10/24/21 128/78  08/08/21 112/70  07/26/21 140/80  Most recent eGFR/CrCl: No results found for: EGFR  No components found for: CRCL  Evaluation of current treatment plan related to hypertension self management and patient's adherence to plan as established by provider; Reviewed medications with patient and discussed importance of compliance; Discussed plans with patient for ongoing care management follow up and provided patient with direct contact information for care management team; Reviewed scheduled/upcoming provider appointments  Monitor blood pressure at least 2-3 times per month  Memory impairment interventions: New goal. Evaluation of current treatment plan related to  memory impairment , self-management and patient's adherence to  plan as established by provider. Discussed plans with patient for ongoing care management follow up and provided patient with direct contact information for care management team Reviewed medications with patient and discussed ; Reviewed scheduled/upcoming provider appointments  Discussed with patient memory impairment self care strategies Discussed caregiver support  Diabetes Interventions: Goal on tract: Yes Assessed patient's  understanding of A1c goal: <7% Evaluation of current treatment plan related to memory impairment, self-management and patient's adherence to plan as established by provider. Discussed plans with patient for ongoing care management follow up and provided patient with direct contact information for care management team Advised patient to notify endocrinologist of hypoglycemic events.  Advised to follow a low carbohydrate/ diabetic diet.  Reviewed signs/ symptoms of hyperglycemia/ hypoglycemia Lab Results  Component Value Date   HGBA1C 6.8 (A) 10/24/2021     Patient Goals/Self-Care Activities: Take medications as prescribed Follow up with providers as recommended.  Call pharmacy / pharmacy programs for medication refills  Call provider office for new concerns or questions Apply memory impairment strategies:  Socialize regularly, get a good nights sleep, eat a healthy diet, read,  and games such as ( sudoku, crossword puzzles) Check blood pressure 2-3 times per month Reminder:  Consider having family member accompany you to your appointments and assist with medication reminders Continue to check your blood sugar as directed by your provider, take your blood sugar readings to your provider appointment Treat low blood sugar according to " the rule of 15" ( keep a snack with you to eat if you know you will be late for lunch)   How to treat low blood sugars (Blood sugar less than 70 mg/dl  Please follow the RULE OF 15 for the treatment of hypoglycemia treatment (When your blood sugars are less than 70 mg/ dl) STEP  1:  Take 15 grams of carbohydrates when your blood sugar is low, which includes:   3-4 glucose tabs or  3-4 oz of juice or regular soda or  One tube of glucose gel STEP 2:  Recheck blood sugar in 15 minutes STEP 3:  If your blood sugar is still low at the 15 minute recheck ---then, go back to STEP 1 and treat again with another 15 grams of carbohydrates       Plan:The patient has  been provided with contact information for the care management team and has been advised to call with any health related questions or concerns.  The care management team will reach out to the patient again over the next 45 days. . Dg

## 2021-11-02 ENCOUNTER — Telehealth: Payer: Self-pay

## 2021-11-02 NOTE — Telephone Encounter (Signed)
Inbound fax requesting forms be completed and faxed with recent clinical notes. Forms completed and faxed to [place] at [phone]. ? ?

## 2021-11-03 ENCOUNTER — Other Ambulatory Visit: Payer: Self-pay | Admitting: Family Medicine

## 2021-11-03 NOTE — Telephone Encounter (Signed)
E-scribed refills.  Plz schedule lab and cpe visits after 03/08/22. ?

## 2021-11-04 ENCOUNTER — Telehealth: Payer: Self-pay

## 2021-11-04 DIAGNOSIS — Z008 Encounter for other general examination: Secondary | ICD-10-CM | POA: Diagnosis not present

## 2021-11-04 DIAGNOSIS — E785 Hyperlipidemia, unspecified: Secondary | ICD-10-CM | POA: Diagnosis not present

## 2021-11-04 DIAGNOSIS — E1165 Type 2 diabetes mellitus with hyperglycemia: Secondary | ICD-10-CM | POA: Diagnosis not present

## 2021-11-04 DIAGNOSIS — G8929 Other chronic pain: Secondary | ICD-10-CM | POA: Diagnosis not present

## 2021-11-04 DIAGNOSIS — I2510 Atherosclerotic heart disease of native coronary artery without angina pectoris: Secondary | ICD-10-CM | POA: Diagnosis not present

## 2021-11-04 DIAGNOSIS — Z7984 Long term (current) use of oral hypoglycemic drugs: Secondary | ICD-10-CM | POA: Diagnosis not present

## 2021-11-04 DIAGNOSIS — E663 Overweight: Secondary | ICD-10-CM | POA: Diagnosis not present

## 2021-11-04 DIAGNOSIS — K219 Gastro-esophageal reflux disease without esophagitis: Secondary | ICD-10-CM | POA: Diagnosis not present

## 2021-11-04 DIAGNOSIS — E1140 Type 2 diabetes mellitus with diabetic neuropathy, unspecified: Secondary | ICD-10-CM | POA: Diagnosis not present

## 2021-11-04 DIAGNOSIS — Z794 Long term (current) use of insulin: Secondary | ICD-10-CM | POA: Diagnosis not present

## 2021-11-04 DIAGNOSIS — M1990 Unspecified osteoarthritis, unspecified site: Secondary | ICD-10-CM | POA: Diagnosis not present

## 2021-11-04 DIAGNOSIS — R69 Illness, unspecified: Secondary | ICD-10-CM | POA: Diagnosis not present

## 2021-11-04 DIAGNOSIS — I10 Essential (primary) hypertension: Secondary | ICD-10-CM | POA: Diagnosis not present

## 2021-11-04 NOTE — Progress Notes (Signed)
? ? ?Chronic Care Management ?Pharmacy Assistant  ? ?Name: Anthony [name]  MRN: [id number] DOB: 1945-09-18 ? ?Reason for Encounter: CCM (Medication Adherence and Delivery Coordination) ?  ?Recent office visits:  ?None since last CCM contact ? ?Recent consult visits:  ?10/24/2021 - [name], MD - Internal Medicine - Patient presented for Type 2 Diabetes. Abnormal Lab "Address by Endocrinology" A1c 6.8. Change: If the sugars are <90 before meals, take NovoLog at the start of the meal. ?10/17/2021 - [name] - Patient presented for diabetic peripheral neuropathy associated with type 2 diabetes mellitus. Patient was delivered shoes and insoles.  ? ?Hospital visits:  ?None in previous 6 months ? ?Medications: ?Outpatient Encounter Medications as of 11/04/2021  ?Medication Sig  ? amLODipine (NORVASC) 5 MG tablet TAKE ONE TABLET BY MOUTH ONCE DAILY  ? b complex vitamins tablet Take 1 tablet by mouth daily.  ? blood glucose meter kit and supplies KIT Dispense based on patient and insurance preference. Use up to four times daily as directed.  ? Continuous Blood Gluc Transmit (DEXCOM G6 TRANSMITTER) MISC 1 Device by Does not apply route every 3 (three) months.  ? Dextrose, Diabetic Use, (GLUCOSE PO) Take 1 tablet by mouth as needed.  ? glucose blood (ONETOUCH ULTRA) test strip USE AS DIRECTED FOUR TIMES DAILY Dx code E11.65  ? GVOKE HYPOPEN 1-PACK 1 MG/0.2ML SOAJ Inject under skin 0.2 mL as needed for hypoglycemia  ? insulin degludec (TRESIBA FLEXTOUCH) 200 UNIT/ML FlexTouch Pen INJECT 32U into SUBCUTANEOUSLY DAILY  ? Insulin Pen Needle (ADVOCATE INSULIN PEN NEEDLES) 31G X 5 MM MISC Use 3x a day  ? metFORMIN (GLUCOPHAGE-XR) 500 MG 24 hr tablet Take 1 tablet (500 mg total) by mouth daily with breakfast.  ? NOVOLOG FLEXPEN 100 UNIT/ML FlexPen INJECT 10-18 UNITS into THE SKIN THREE TIMES DAILY with meals  ? olmesartan-hydrochlorothiazide (BENICAR HCT) 40-25 MG tablet TAKE 1 TABLET BY MOUTH EVERY DAY  ?  pantoprazole (PROTONIX) 40 MG tablet TAKE ONE TABLET BY MOUTH ONCE DAILY  ? Semaglutide, 1 MG/DOSE, (OZEMPIC, 1 MG/DOSE,) 4 MG/3ML SOPN Inject 1 mg into the skin once a week.  ? simvastatin (ZOCOR) 20 MG tablet TAKE 1 TABLET BY MOUTH EVERYDAY AT BEDTIME  ? tadalafil (CIALIS) 20 MG tablet TAKE 1/2 TO 1 TABLET BY MOUTH EVERY OTHER DAY AS NEEDED FOR erectile dysfunction  ? ?No facility-administered encounter medications on file as of 11/04/2021.  ? ?BP Readings from Last 3 Encounters:  ?10/24/21 128/78  ?08/08/21 112/70  ?07/26/21 140/80  ?  ?Lab Results  ?Component Value Date  ? HGBA1C 6.8 (A) 10/24/2021  ?  ?Recent OV, Consult or Hospital visit:  ?Recent medication changes indicated:  ?Change: If the sugars are <90 before meals, take NovoLog at the start of the meal. ? ?Last adherence delivery date: 10/17/2021     ? ?Patient is due for next adherence delivery on: 11/15/2021 ? ?Spoke with patient on 11/04/2021 reviewed medications and coordinated delivery. ? ?VIAL medications: ?Tresiba Flex 200 units- 32 units once daily in morning  ?Gvoke Hypopen - 1 mg - Inject 0.2 mL as needed ?Amlodipine 5 mg 1 tablet daily ?Pantoprazole   40 mg 1 tablet daily ?Tadalafil 20 mg - 1/2 to 1 tablet daily as needed- PRN use ?Olmesartan/HCTZ 40-25 mg Take 1 tablet by mouth every day ?Simvastatin 20 mg Take 1 tablet every day at bedtime ?Novolog Flexpen Inject 10-18 units into the skin 3 times daily with meals ?Ozempic 1 mg/dose - Inject  once weekly  ?OneTouch Test strips - Use up to four times daily ? ?Patient declined the following medications this month: ?Metformin 500 mg ER - take 1 tablet every morning (pt has excess supply) ? ?Any concerns about your medications? No ? ?How often do you forget or accidentally miss a dose? Never ? ?Do you use a pillbox? No ? ?Is patient in packaging No ?  ?Refills requested from providers include: ?Amlodipine 5 mg 1 tablet daily ?Pantoprazole   40 mg 1 tablet daily ?Tadalafil 20 mg - 1/2 to 1 tablet  daily as needed- PRN use ? ?Confirmed delivery date of 11/15/2021, advised patient that pharmacy will contact them the morning of delivery.  ? ?Recent blood pressure readings are as follows:  Patient does not take blood pressure at home.  ? ?Recent blood glucose readings are as follows:  ?Date Time   Reading Treatment   ?03/10 8:00 am  116  32 Units Tresiba  ?11:30 am  263  12 Units Novolog - ate a ham/cheese sandwich ?03/09 8:00 am  130  32 Units [name] ? After Breakfast 160  12 Units Novolog ? Before Lunch  236  15 Units Novolog ?03/08 8:00 am  316  32 Units [name] ? After Breakfast 313  18 Units Novolog ? Before Lunch  121  15 Units Novolog ? After Dinner  310  12 Units Novolog ? Before Bed  320  12 Units Novolog  ?03/07 8:00 am  131  32 Units [name] ? Before Lunch  345  15 Units Novolog ? Before Dinner  304  18 Units Novolog ? ?Annual wellness visit in last year? Yes 07/26/2021 ?Most Recent BP reading: 128/78 10/24/2021 ? ?If Diabetic: ?Most recent A1C reading: 6.8 on 10/24/2021 ?Last eye exam / retinopathy screening: Up to date ?Last diabetic foot exam: Up to date ? ?Charlene Brooke, CPP notified ? ?[name], RMA ?Clinical Pharmacy Assistant ?[phone] ? ? ? ?

## 2021-11-09 ENCOUNTER — Other Ambulatory Visit: Payer: Self-pay | Admitting: Family Medicine

## 2021-11-21 DIAGNOSIS — E113291 Type 2 diabetes mellitus with mild nonproliferative diabetic retinopathy without macular edema, right eye: Secondary | ICD-10-CM | POA: Diagnosis not present

## 2021-11-21 DIAGNOSIS — Z794 Long term (current) use of insulin: Secondary | ICD-10-CM | POA: Diagnosis not present

## 2021-11-24 ENCOUNTER — Other Ambulatory Visit: Payer: Self-pay | Admitting: Internal Medicine

## 2021-11-24 DIAGNOSIS — E113299 Type 2 diabetes mellitus with mild nonproliferative diabetic retinopathy without macular edema, unspecified eye: Secondary | ICD-10-CM

## 2021-11-25 DIAGNOSIS — Z7984 Long term (current) use of oral hypoglycemic drugs: Secondary | ICD-10-CM | POA: Diagnosis not present

## 2021-11-25 DIAGNOSIS — Z794 Long term (current) use of insulin: Secondary | ICD-10-CM

## 2021-11-25 DIAGNOSIS — I10 Essential (primary) hypertension: Secondary | ICD-10-CM | POA: Diagnosis not present

## 2021-11-25 DIAGNOSIS — E1159 Type 2 diabetes mellitus with other circulatory complications: Secondary | ICD-10-CM | POA: Diagnosis not present

## 2021-11-27 ENCOUNTER — Other Ambulatory Visit: Payer: Self-pay | Admitting: Internal Medicine

## 2021-12-01 ENCOUNTER — Telehealth: Payer: Self-pay

## 2021-12-01 MED ORDER — SIMVASTATIN 20 MG PO TABS: ORAL_TABLET | ORAL | 1 refills | Status: DC

## 2021-12-01 NOTE — Telephone Encounter (Signed)
E-scribed refill 

## 2021-12-02 ENCOUNTER — Other Ambulatory Visit: Payer: Self-pay | Admitting: Family Medicine

## 2021-12-05 ENCOUNTER — Telehealth: Payer: Self-pay

## 2021-12-05 NOTE — Progress Notes (Addendum)
? ? ?Chronic Care Management ?Pharmacy Assistant  ? ?Name: Anthony [name]  MRN: [id number] DOB: 1946-06-30 ? ?Reason for Encounter: CCM (Medication Adherence and Delivery Coordination) ?  ?Recent office visits:  ?None since last CCM contact ? ?Recent consult visits:  ?None since last CCM contact ? ?Hospital visits:  ?None in previous 6 months ? ?Medications: ?Outpatient Encounter Medications as of 12/05/2021  ?Medication Sig  ? amLODipine (NORVASC) 5 MG tablet TAKE ONE TABLET BY MOUTH ONCE DAILY  ? b complex vitamins tablet Take 1 tablet by mouth daily.  ? blood glucose meter kit and supplies KIT Dispense based on patient and insurance preference. Use up to four times daily as directed.  ? Continuous Blood Gluc Transmit (DEXCOM G6 TRANSMITTER) MISC 1 Device by Does not apply route every 3 (three) months.  ? Dextrose, Diabetic Use, (GLUCOSE PO) Take 1 tablet by mouth as needed.  ? glucose blood (ONETOUCH ULTRA) test strip USE AS DIRECTED FOUR TIMES DAILY  ? GVOKE HYPOPEN 1-PACK 1 MG/0.2ML SOAJ Inject under skin 0.2 mL as needed for hypoglycemia  ? insulin degludec (TRESIBA FLEXTOUCH) 200 UNIT/ML FlexTouch Pen INJECT 32U into SUBCUTANEOUSLY DAILY  ? Insulin Pen Needle (ADVOCATE INSULIN PEN NEEDLES) 31G X 5 MM MISC Use 3x a day  ? metFORMIN (GLUCOPHAGE-XR) 500 MG 24 hr tablet Take 1 tablet (500 mg total) by mouth daily with breakfast.  ? NOVOLOG FLEXPEN 100 UNIT/ML FlexPen INJECT 10-18 UNITS into THE SKIN THREE TIMES DAILY with meals  ? olmesartan-hydrochlorothiazide (BENICAR HCT) 40-25 MG tablet TAKE 1 TABLET BY MOUTH EVERY DAY  ? pantoprazole (PROTONIX) 40 MG tablet TAKE ONE TABLET BY MOUTH ONCE DAILY  ? Semaglutide, 1 MG/DOSE, (OZEMPIC, 1 MG/DOSE,) 4 MG/3ML SOPN Inject 1 mg into the skin once a week.  ? simvastatin (ZOCOR) 20 MG tablet TAKE 1 TABLET BY MOUTH EVERYDAY AT BEDTIME  ? tadalafil (CIALIS) 20 MG tablet TAKE 1/2 TO 1 TABLET BY MOUTH every other DAY AS NEEDED FOR erectile dysfunction  ? ?No  facility-administered encounter medications on file as of 12/05/2021.  ? ?BP Readings from Last 3 Encounters:  ?10/24/21 128/78  ?08/08/21 112/70  ?07/26/21 140/80  ?  ?Lab Results  ?Component Value Date  ? HGBA1C 6.8 (A) 10/24/2021  ?  ?No OVs, Consults, or hospital visits since last care coordination call / Pharmacist visit. ?No medication changes indicated ? ?Last adherence delivery date: 10/17/2021     ? ?Patient is due for next adherence delivery on: 12/15/21 ? ?Spoke with patient on 12/05/2021 reviewed medications and coordinated delivery. ? ?VIAL medications: ?Simvastatin 20 mg Take 1 tablet every day at bedtime ?OneTouch Test strips - Use up to four times daily ?Tadalafil 20 mg - 1/2 to 1 tablet daily as needed- PRN use ?Olmesartan/HCTZ 40-25 mg Take 1 tablet by mouth every day ?Amlodipine 5 mg 1 tablet daily ?Pantoprazole   40 mg 1 tablet daily ?Novolog Flexpen Inject 10-18 units into the skin 3 times daily with meals ?Gvoke Hypopen - 1 mg - Inject 0.2 mL as needed ?Tresiba Flex 200 units- 32 units once daily in morning  ?Ozempic 1 mg/dose - Inject once weekly  ?  ?Patient declined the following medications this month: ?Metformin 500 mg ER - take 1 tablet every morning (pt has excess supply as RX was for 3 a day now takes 1) ? ?Any concerns about your medications? No ? ?How often do you forget or accidentally miss a dose? Never ? ?Do you use a pillbox? No ? ?  Is patient in packaging No ?  ?Refills requested from providers include: ?Tadalafil 20 mg - 1/2 to 1 tablet daily as needed- PRN use ? ?Confirmed delivery date of 12/15/2021, advised patient that pharmacy will contact them the morning of delivery.  ? ?Recent blood pressure readings are as follows: Patient does not take blood pressure at home.  ? ?Recent blood glucose readings are as follows:  ? ?04/10 243 - Patient took while on the phone with me ? ?04/09 Breakfast - 159 ?  [name] - 32 Units ?  Novolog - 7 Units ? Lunch - 500 ?  Novolog - 18  Units ? Dinner  - 400 ?  Novolog - 15 Units ? Before Bedtime - 204 ?  Novolog - 15 Units ? Bedtime - 195 ?  Novolog - 12 Units ? ?04/06 Breakfast - 266 ?  [name] - 32 Units ?  Novolog - 12 Units ? Lunch - 214 ?  Novolog - 12 Units ? ?04/04 Breakfast - 289 ?  [name] - 32 Units ?  Novolog - 15 Units ? ?04/03 Breakfast - 225 ?  [name] - 32 Units ?  Novolog - 15 Units ? Lunch - 143 ?  Novolog - 143 ? After Lunch - 314 ?  Novolog - 15 Units ? ?Patient would like to inquire about patient assistance for Ozempic. Patient would like for his son to look in to financial requirements for him. I will e-mail patient the [employer] site so his son can calculate the income requirements. Patient will call me back with the information on if they will meet the financial requirements once his son is able to help them. Information has been emailed to patient at [email or web address]. ? ?Annual wellness visit in last year? Yes 07/26/2021 ?Most Recent BP reading: 128/78 10/24/2021 ?  ?If Diabetic: ?Most recent A1C reading: 6.8 on 10/24/2021 ?Last eye exam / retinopathy screening: Up to date ?Last diabetic foot exam: Up to date ?  ?Charlene Brooke, CPP notified ?  ?[name], RMA ?Clinical Pharmacy Assistant ?[phone] ?

## 2021-12-08 ENCOUNTER — Other Ambulatory Visit: Payer: Self-pay | Admitting: Internal Medicine

## 2021-12-13 ENCOUNTER — Other Ambulatory Visit (HOSPITAL_COMMUNITY): Payer: Self-pay

## 2021-12-15 ENCOUNTER — Ambulatory Visit (INDEPENDENT_AMBULATORY_CARE_PROVIDER_SITE_OTHER): Payer: Medicare HMO

## 2021-12-15 DIAGNOSIS — Z794 Long term (current) use of insulin: Secondary | ICD-10-CM

## 2021-12-15 DIAGNOSIS — R413 Other amnesia: Secondary | ICD-10-CM

## 2021-12-15 DIAGNOSIS — I10 Essential (primary) hypertension: Secondary | ICD-10-CM

## 2021-12-15 NOTE — Patient Instructions (Addendum)
Visit Information ? ?Thank you for taking time to visit with me today. Please don't hesitate to contact me if I can be of assistance to you before our next scheduled telephone appointment. ? ?Following are the goals we discussed today:  ?Continue to take medications as prescribed ?Attend scheduled primary care provider follow up appointment on 12/27/21 at 12:30 pm ?Notify your endocrinologist of your frequent low blood sugar readings and increase in activity.  ?Follow up with providers as recommended.  ?Call pharmacy / pharmacy programs for medication refills  ?Call provider office for new concerns or questions ?Apply memory impairment strategies:  Socialize regularly, get a good nights sleep, eat a healthy diet, read,  and games such as ( sudoku, crossword puzzles) ?Check blood pressure 2-3 times per month ?Reminder:  Consider having family member accompany you to your appointments and assist with medication reminders ?Continue to check your blood sugar as directed by your provider, take your blood sugar readings to your provider appointment ?Treat low blood sugar according to " the rule of 15" ( keep a snack with you to eat if you know you will be late for lunch) ?  How to treat low blood sugars (Blood sugar less than 70 mg/dl ? Please follow the RULE OF 15 for the treatment of hypoglycemia treatment (When your blood sugars are less than 70 mg/ dl) ?STEP  1:  Take 15 grams of carbohydrates when your blood sugar is low, which includes:  ? 3-4 glucose tabs or ? 3-4 oz of juice or regular soda or ? One tube of glucose gel ?STEP 2:  Recheck blood sugar in 15 minutes ?STEP 3:  If your blood sugar is still low at the 15 minute recheck ---then, go back to STEP 1 and treat again with another 15 grams of carbohydrates ? ?Our next appointment is by telephone on 01/16/22 at 1:00 pm ? ?Please call the care guide team at [phone] if you need to cancel or reschedule your appointment.  ? ?If you are experiencing a Mental Health  or [place] or need someone to talk to, please call the Suicide and Crisis Lifeline: 988 ?call 1-800-273-TALK (toll free, 24 hour hotline)  ? ?Patient verbalizes understanding of instructions and care plan provided today and agrees to view in [place]. Active MyChart status confirmed with patient.   ? ?Quinn Plowman RN,BSN,CCM ?RN Case Manager ?[place]  ?[phone] ? ?

## 2021-12-15 NOTE — Chronic Care Management (AMB) (Signed)
?Chronic Care Management  ? ?CCM RN Visit Note ? ?12/15/2021 ?Name: Anthony [name] MRN: [id number] DOB: Apr 01, 1946 ? ?Subjective: ?Anthony [name] is a 76 y.o. year old male who is a primary care patient of [name], MD. The care management team was consulted for assistance with disease management and care coordination needs.   ? ?Engaged with patient by telephone for follow up visit in response to provider referral for case management and/or care coordination services.  ? ?Consent to Services:  ?The patient was given information about Chronic Care Management services, agreed to services, and gave verbal consent prior to initiation of services.  Please see initial visit note for detailed documentation.  ? ?Patient agreed to services and verbal consent obtained.  ? ?Assessment: Review of patient past medical history, allergies, medications, health status, including review of consultants reports, laboratory and other test data, was performed as part of comprehensive evaluation and provision of chronic care management services.  ? ?SDOH (Social Determinants of Health) assessments and interventions performed:   ? ?CCM Care Plan ? ?No Known Allergies ? ?Outpatient Encounter Medications as of 12/15/2021  ?Medication Sig  ? amLODipine (NORVASC) 5 MG tablet TAKE ONE TABLET BY MOUTH ONCE DAILY  ? b complex vitamins tablet Take 1 tablet by mouth daily.  ? blood glucose meter kit and supplies KIT Dispense based on patient and insurance preference. Use up to four times daily as directed.  ? Continuous Blood Gluc Transmit (DEXCOM G6 TRANSMITTER) MISC 1 Device by Does not apply route every 3 (three) months.  ? Dextrose, Diabetic Use, (GLUCOSE PO) Take 1 tablet by mouth as needed.  ? glucose blood (ONETOUCH ULTRA) test strip USE AS DIRECTED FOUR TIMES DAILY  ? GVOKE HYPOPEN 1-PACK 1 MG/0.2ML SOAJ Inject under skin 0.2 mL as needed for hypoglycemia  ? insulin degludec (TRESIBA FLEXTOUCH) 200 UNIT/ML FlexTouch Pen INJECT  32U into SUBCUTANEOUSLY DAILY  ? Insulin Pen Needle (ADVOCATE INSULIN PEN NEEDLES) 31G X 5 MM MISC Use 3x a day  ? metFORMIN (GLUCOPHAGE-XR) 500 MG 24 hr tablet Take 1 tablet (500 mg total) by mouth daily with breakfast.  ? NOVOLOG FLEXPEN 100 UNIT/ML FlexPen INJECT 10-18 UNITS into THE SKIN THREE TIMES DAILY with meals  ? olmesartan-hydrochlorothiazide (BENICAR HCT) 40-25 MG tablet TAKE 1 TABLET BY MOUTH EVERY DAY  ? OZEMPIC, 1 MG/DOSE, 4 MG/3ML SOPN Inject 1 mg into the skin once a week.  ? pantoprazole (PROTONIX) 40 MG tablet TAKE ONE TABLET BY MOUTH ONCE DAILY  ? simvastatin (ZOCOR) 20 MG tablet TAKE 1 TABLET BY MOUTH EVERYDAY AT BEDTIME  ? tadalafil (CIALIS) 20 MG tablet TAKE 1/2 TO 1 TABLET BY MOUTH every other DAY AS NEEDED FOR erectile dysfunction  ? ?No facility-administered encounter medications on file as of 12/15/2021.  ? ? ?Patient Active Problem List  ? Diagnosis Date Noted  ? Type 2 diabetes mellitus with hypoglycemia ([place]) 03/10/2021  ? Memory impairment 02/10/2021  ? Non-adherence to medical treatment 10/27/2020  ? COPD (chronic obstructive pulmonary disease) ([place]) 02/28/2020  ? Coronary artery calcification seen on CAT scan 02/28/2020  ? Aortic atherosclerosis ([place]) 02/28/2020  ? Ex-smoker 01/21/2020  ? RLQ abdominal pain 01/21/2020  ? Numbness and tingling in left arm 01/21/2020  ? Health maintenance examination 01/05/2017  ? Right knee pain 01/18/2016  ? Barrett's esophagus 12/08/2015  ? Abnormal tympanic membrane of right ear 07/05/2015  ? Advanced care planning/counseling discussion 06/30/2014  ? Medicare annual wellness visit, subsequent 06/22/2013  ? Benign prostatic  hyperplasia with urinary obstruction 06/18/2013  ? ED (erectile dysfunction) 04/10/2012  ? Hyperlipidemia associated with type 2 diabetes mellitus ([place]) 12/29/2008  ? LEG PAIN, BILATERAL 11/19/2008  ? 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 ANXIETY 07/24/2007  ? GERD  07/24/2007  ? ? ?Conditions to be addressed/monitored:HTN, DMII, and Memory Impairment ? ?Care Plan : RNCM plan of care  ?Updates made by Dannielle Karvonen, RN since 12/15/2021 12:00 AM  ?  ? ?Problem: Chronic disese management education and / or care coordination needs.( Diabetes, Hypertension)   ?Priority: High  ?  ? ?Long-Range Goal: Development of plan of care to address chronic disease management and / or care coordination needs ( DMII, Hypertension)   ?Expected End Date: 01/25/2022  ?Priority: High  ?Note:   ?Current Barriers:  ?Knowledge Deficits related to plan of care for management of HTN and DMII  ?Chronic Disease Management support and education needs related to HTN and DMII ?Cognitive Deficits Memory impairment ?Patient states he continues to have low blood sugars every other day. He states his blood sugar lows range from 50-70.   Patient states his low blood sugars seem to be more frequent due to him being more active outside since the weather has warmed up.  He states if he has a non active day and is mostly sitting around the house his blood sugar does not drop.   Patient states he continues to wear his DEXCOM which alerts him to low blood sugars.  He states some of his low blood sugars occur in the middle of the night when he is asleep.  Patient states he is using liquid glucose to treat his hypoglycemic events.  He states his blood sugar will occasionally drop again even after treatment with glucose tabs/ liquid.   Per chart review patient has missed 3 appointments with primary care provider since October 2022. RNCM called primary care provider office and spoke with Meriel Pica.  PCP appointment follow up scheduled for 12/27/21 at 12:30 pm for patient.  Patient called and notified of scheduled follow up appointment with PCP ( 12/27/21 at 12:30 pm). ?RNCM Clinical Goal(s):  ?Patient will verbalize understanding of plan for management of HTN and DMII ?take all medications exactly as prescribed and will call  provider for medication related questions ?attend all scheduled medical appointments:  ?continue to work with RN Care Manager to address care management and care coordination needs related to  HTN and DMII through collaboration with RN Care manager, provider, and care team.  ?patient will demonstrate improved adherence to prescribed treatment plan for diabetes self care/management as evidenced by: daily monitoring and recording of CBG  adherence to ADA/ carb modified diet adherence to prescribed medication regimen contacting provider for new or worsened symptoms or questions:  ? ?Interventions: ?1:1 collaboration with primary care provider regarding development and update of comprehensive plan of care as evidenced by provider attestation and co-signature ?Inter-disciplinary care team collaboration (see longitudinal plan of care) ?Evaluation of current treatment plan related to  self management and patient's adherence to plan as established by provider ? ?Hypertension Interventions: Goal on track: Yes ?Last practice recorded BP readings:  ?BP Readings from Last 3 Encounters:  ?10/24/21 128/78  ?08/08/21 112/70  ?07/26/21 140/80  ?Most recent eGFR/CrCl: No results found for: EGFR  No components found for: CRCL ? ?Evaluation of current treatment plan related to hypertension self management and patient's adherence to plan as established by provider; ?Reviewed medications with patient and  discussed importance of compliance; ?Discussed plans with patient for ongoing care management follow up and provided patient with direct contact information for care management team; ?Reviewed scheduled/upcoming provider appointments  ?Monitor blood pressure at least 2-3 times per month ? ?Memory impairment interventions: Goal on track:  Yes. Long term ?Evaluation of current treatment plan related to  memory impairment , self-management and patient's adherence to plan as established by provider. ?Discussed plans with patient for ongoing  care management follow up and provided patient with direct contact information for care management team ?Reviewed medications with patient and discussed ; ?Reviewed scheduled/upcoming provider appointments  ?D

## 2021-12-16 ENCOUNTER — Encounter: Payer: Self-pay | Admitting: Podiatry

## 2021-12-16 ENCOUNTER — Ambulatory Visit: Payer: Medicare HMO | Admitting: Podiatry

## 2021-12-16 ENCOUNTER — Ambulatory Visit (INDEPENDENT_AMBULATORY_CARE_PROVIDER_SITE_OTHER): Payer: Medicare HMO | Admitting: Podiatry

## 2021-12-16 DIAGNOSIS — M79675 Pain in left toe(s): Secondary | ICD-10-CM

## 2021-12-16 DIAGNOSIS — B351 Tinea unguium: Secondary | ICD-10-CM

## 2021-12-16 DIAGNOSIS — M79674 Pain in right toe(s): Secondary | ICD-10-CM | POA: Diagnosis not present

## 2021-12-16 DIAGNOSIS — E1142 Type 2 diabetes mellitus with diabetic polyneuropathy: Secondary | ICD-10-CM | POA: Diagnosis not present

## 2021-12-21 DIAGNOSIS — Z794 Long term (current) use of insulin: Secondary | ICD-10-CM | POA: Diagnosis not present

## 2021-12-21 DIAGNOSIS — E113291 Type 2 diabetes mellitus with mild nonproliferative diabetic retinopathy without macular edema, right eye: Secondary | ICD-10-CM | POA: Diagnosis not present

## 2021-12-25 DIAGNOSIS — I10 Essential (primary) hypertension: Secondary | ICD-10-CM

## 2021-12-25 DIAGNOSIS — Z794 Long term (current) use of insulin: Secondary | ICD-10-CM | POA: Diagnosis not present

## 2021-12-25 DIAGNOSIS — E1159 Type 2 diabetes mellitus with other circulatory complications: Secondary | ICD-10-CM

## 2021-12-25 NOTE — Progress Notes (Signed)
?  Subjective:  ?Patient ID: Anthony [name], male    DOB: 09-05-1945,  MRN: [id number] ? ?Anthony [name] presents to clinic today for at risk foot care with history of diabetic neuropathy and callus(es) right lower extremity and painful thick toenails that are difficult to trim. Painful toenails interfere with ambulation. Aggravating factors include wearing enclosed shoe gear. Pain is relieved with periodic professional debridement. Painful calluses are aggravated when weightbearing with and without shoegear. Pain is relieved with periodic professional debridement. ? ?Patient states blood glucose was 201 mg/dl today.   ? ?New problem(s): None.  ? ?PCP is [name], MD , and last visit was June 27, 2021. ? ?No Known Allergies ? ?Review of Systems: Negative except as noted in the HPI. ? ?Objective: No changes noted in today's physical examination. ? ?There were no vitals filed for this visit. ? ?Anthony [name] is a pleasant 76 y.o. male in NAD. AAO X 3. ? ?Vascular Examination: ?CFT <3 seconds b/l LE. Palpable DP/PT pulses b/l LE. Digital hair sparse b/l. Skin temperature gradient WNL b/l. No pain with calf compression b/l. No edema noted b/l. No cyanosis or clubbing noted b/l LE. ? ?Dermatological Examination: ?Pedal skin warm and supple b/l.  No open wounds b/l. No interdigital macerations. Toenails 1-5 b/l elongated, thickened, discolored with subungual debris. +Tenderness with dorsal palpation of nailplates. Minimal hyperkeratotic lesion(s) noted submet head 1 right foot.  ? ?Musculoskeletal Examination: ?Muscle strength 5/5 to all lower extremity muscle groups bilaterally. HAV with bunion deformity noted b/l LE. Hammertoe deformity noted 2-5 b/l. ? ?Neurological Examination: ?Protective sensation diminished with 10g monofilament b/l. ? ?  Latest Ref Rng & Units 10/24/2021  ? 11:36 AM 07/26/2021  ? 11:09 AM 02/17/2021  ?  4:22 PM  ?Hemoglobin A1C  ?Hemoglobin-A1c 4.0 - 5.6 % 6.8   7.0   6.9     ? ?Assessment/Plan: ?1. Pain due to onychomycosis of toenails of both feet   ?2. Diabetic peripheral neuropathy associated with type 2 diabetes mellitus (Anthony [name])   ?-Examined patient. ?-Continue diabetic foot care principles: inspect feet daily, monitor glucose as recommended by PCP and/or Endocrinologist, and follow prescribed diet per PCP, Endocrinologist and/or dietician. ?-Toenails 1-5 b/l were debrided in length and girth with sterile nail nippers and dremel without iatrogenic bleeding.  ?-Patient/POA to call should there be question/concern in the interim.  ? ?Return in about 3 months (around 03/17/2022). ? ?Marzetta Board, DPM  ?

## 2021-12-27 ENCOUNTER — Ambulatory Visit (INDEPENDENT_AMBULATORY_CARE_PROVIDER_SITE_OTHER): Payer: Medicare HMO | Admitting: Family Medicine

## 2021-12-27 ENCOUNTER — Encounter: Payer: Self-pay | Admitting: Family Medicine

## 2021-12-27 ENCOUNTER — Ambulatory Visit (INDEPENDENT_AMBULATORY_CARE_PROVIDER_SITE_OTHER)
Admission: RE | Admit: 2021-12-27 | Discharge: 2021-12-27 | Disposition: A | Discharge status: Home / Self Care | Payer: Medicare HMO | Source: Ambulatory Visit | Attending: Family Medicine | Admitting: Family Medicine

## 2021-12-27 VITALS — BP 132/72 | HR 79 | Temp 98.2°F | Wt 218.0 lb

## 2021-12-27 DIAGNOSIS — Z794 Long term (current) use of insulin: Secondary | ICD-10-CM | POA: Diagnosis not present

## 2021-12-27 DIAGNOSIS — G8929 Other chronic pain: Secondary | ICD-10-CM

## 2021-12-27 DIAGNOSIS — E11649 Type 2 diabetes mellitus with hypoglycemia without coma: Secondary | ICD-10-CM

## 2021-12-27 DIAGNOSIS — E113299 Type 2 diabetes mellitus with mild nonproliferative diabetic retinopathy without macular edema, unspecified eye: Secondary | ICD-10-CM

## 2021-12-27 DIAGNOSIS — M170 Bilateral primary osteoarthritis of knee: Secondary | ICD-10-CM | POA: Insufficient documentation

## 2021-12-27 DIAGNOSIS — M25562 Pain in left knee: Secondary | ICD-10-CM

## 2021-12-27 DIAGNOSIS — M1712 Unilateral primary osteoarthritis, left knee: Secondary | ICD-10-CM | POA: Insufficient documentation

## 2021-12-27 LAB — POCT GLYCOSYLATED HEMOGLOBIN (HGB A1C): Hemoglobin A1C: 7 % — AB (ref 4.0–5.6)

## 2021-12-27 NOTE — Assessment & Plan Note (Addendum)
Chronic brittle diabetes followed by endo, significant improvement since starting to use DexCom G6 continuous glucose monitor with significant decrease in hypoglycemic episodes. CGM has hypoglycemia alarm.  ?He recently stopped ozempic due to anorexia - rec he reach out to endo to discuss lower ozempic dose instead of fully stopping med.  ?

## 2021-12-27 NOTE — Assessment & Plan Note (Addendum)
Significant improvement since starting CGM.  ?

## 2021-12-27 NOTE — Assessment & Plan Note (Signed)
Longstanding, previous benefit with PT, no benefit with steroid injections.  ?Check L knee xray to eval osteoarthritis load, discussed possible ortho referral.  ?

## 2021-12-27 NOTE — Progress Notes (Signed)
? ? Patient ID: Anthony [name], male    DOB: [birth year], 76 y.o.   MRN: [id number] ? ?This visit was conducted in person. ? ?BP 132/72   Pulse 79   Temp 98.2 ?F (36.8 ?C) (Temporal)   Wt 218 lb (98.9 kg)   SpO2 95%   BMI 29.57 kg/m?   ? ?CC: DM f/u visit  ?Subjective:  ? ?HPI: ?Anthony [name] is a 76 y.o. male presenting on 12/27/2021 for Follow-up (DM ) ? ? ?Last seen 02/2021.  ?Missed last 2 appointments - unable to find [employer].  ?Notes worsening memory with wife. He will schedule her an appt as overdue.  ? ?Notes significant difficulty with L>R knee osteoarthritis. Has previously completed PT with benefit. Injections previously didn't help.  ? ?DM followed by Dr Cruzita Lederer. Does regularly check sugars via CGM. Compliant with antihyperglycemic regimen which includes: novolog flexpen 10-18u TID with meals, tresiba 32u daily, metformin XR 565m daily, and ozempic 194mweekly - just restarting this. Denies low sugars or hypoglycemic symptoms. Denies paresthesias, blurry vision. Last diabetic eye exam 08/2021. Glucometer brand: DexCom G6 - this has significantly helped hypoglycemia. Last foot exam: 08/2021. DSME: declined. ?Lab Results  ?Component Value Date  ? HGBA1C 7.0 (A) 12/27/2021  ? ?Diabetic Foot Exam - Simple   ?No data filed ?  ? ?Lab Results  ?Component Value Date  ? MICROALBUR 6.3 (H) 06/28/2015  ?  ? ?   ? ?Relevant past medical, surgical, family and social history reviewed and updated as indicated. Interim medical history since our last visit reviewed. ?Allergies and medications reviewed and updated. ?Outpatient Medications Prior to Visit  ?Medication Sig Dispense Refill  ? amLODipine (NORVASC) 5 MG tablet TAKE ONE TABLET BY MOUTH ONCE DAILY 90 tablet 0  ? b complex vitamins tablet Take 1 tablet by mouth daily.    ? blood glucose meter kit and supplies KIT Dispense based on patient and insurance preference. Use up to four times daily as directed. 1 each 0  ? Continuous Blood Gluc Transmit  (DEXCOM G6 TRANSMITTER) MISC 1 Device by Does not apply route every 3 (three) months. 1 each 3  ? Dextrose, Diabetic Use, (GLUCOSE PO) Take 1 tablet by mouth as needed.    ? glucose blood (ONETOUCH ULTRA) test strip USE AS DIRECTED FOUR TIMES DAILY 150 strip 3  ? GVOKE HYPOPEN 1-PACK 1 MG/0.2ML SOAJ Inject under skin 0.2 mL as needed for hypoglycemia 0.2 mL 11  ? insulin degludec (TRESIBA FLEXTOUCH) 200 UNIT/ML FlexTouch Pen INJECT 32U into SUBCUTANEOUSLY DAILY 27 mL 2  ? Insulin Pen Needle (ADVOCATE INSULIN PEN NEEDLES) 31G X 5 MM MISC Use 3x a day 300 each 3  ? metFORMIN (GLUCOPHAGE-XR) 500 MG 24 hr tablet Take 1 tablet (500 mg total) by mouth daily with breakfast. 360 tablet 0  ? NOVOLOG FLEXPEN 100 UNIT/ML FlexPen INJECT 10-18 UNITS into THE SKIN THREE TIMES DAILY with meals 60 mL 1  ? olmesartan-hydrochlorothiazide (BENICAR HCT) 40-25 MG tablet TAKE 1 TABLET BY MOUTH EVERY DAY 90 tablet 2  ? OZEMPIC, 1 MG/DOSE, 4 MG/3ML SOPN Inject 1 mg into the skin once a week. 9 mL 3  ? pantoprazole (PROTONIX) 40 MG tablet TAKE ONE TABLET BY MOUTH ONCE DAILY 90 tablet 0  ? simvastatin (ZOCOR) 20 MG tablet TAKE 1 TABLET BY MOUTH EVERYDAY AT BEDTIME 90 tablet 1  ? tadalafil (CIALIS) 20 MG tablet TAKE 1/2 TO 1 TABLET BY MOUTH every other DAY AS NEEDED FOR  erectile dysfunction 5 tablet 4  ? ?No facility-administered medications prior to visit.  ?  ? ?Per HPI unless specifically indicated in ROS section below ?Review of Systems ? ?Objective:  ?BP 132/72   Pulse 79   Temp 98.2 ?F (36.8 ?C) (Temporal)   Wt 218 lb (98.9 kg)   SpO2 95%   BMI 29.57 kg/m?   ?Wt Readings from Last 3 Encounters:  ?12/27/21 218 lb (98.9 kg)  ?10/24/21 211 lb (95.7 kg)  ?08/08/21 207 lb (93.9 kg)  ?  ?  ?Physical Exam ?Vitals and nursing note reviewed.  ?Constitutional:   ?   Appearance: Normal appearance. He is not ill-appearing.  ?Eyes:  ?   Extraocular Movements: Extraocular movements intact.  ?   Conjunctiva/sclera: Conjunctivae normal.  ?    Pupils: Pupils are equal, round, and reactive to light.  ?Cardiovascular:  ?   Rate and Rhythm: Normal rate and regular rhythm.  ?   Pulses: Normal pulses.  ?   Heart sounds: Normal heart sounds. No murmur heard. ?Pulmonary:  ?   Effort: Pulmonary effort is normal. No respiratory distress.  ?   Breath sounds: Normal breath sounds. No wheezing, rhonchi or rales.  ?Musculoskeletal:  ?   Right lower leg: No edema.  ?   Left lower leg: No edema.  ?   Comments:  ?See HPI for foot exam if done ?Tender crepitus with testing L knee in flexion/extension without significant pain to palpation at joint line.   ?Skin: ?   General: Skin is warm and dry.  ?   Findings: No rash.  ?Neurological:  ?   Mental Status: He is alert.  ?Psychiatric:     ?   Mood and Affect: Mood normal.     ?   Behavior: Behavior normal.  ? ?   ?Results for orders placed or performed in visit on 12/27/21  ?POCT glycosylated hemoglobin (Hb A1C)  ?Result Value Ref Range  ? Hemoglobin A1C 7.0 (A) 4.0 - 5.6 %  ? HbA1c POC (<> result, manual entry)    ? HbA1c, POC (prediabetic range)    ? HbA1c, POC (controlled diabetic range)    ? ? ?Assessment & Plan:  ? ?Problem List Items Addressed This Visit   ? ? 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  Chronic brittle diabetes followed by endo, significant improvement since starting to use DexCom G6 continuous glucose monitor with significant decrease in hypoglycemic episodes. CGM has hypoglycemia alarm.  ?He recently stopped ozempic due to anorexia - rec he reach out to endo to discuss lower ozempic dose instead of fully stopping med.  ? ?  ?  ? Type 2 diabetes mellitus with hypoglycemia ([name])  ?  Significant improvement since starting CGM.  ? ?  ?  ? Relevant Orders  ? POCT glycosylated hemoglobin (Hb A1C) (Completed)  ? Chronic pain of left knee  ?  Longstanding, previous benefit with PT, no benefit with steroid injections.  ?Check L knee xray to eval  osteoarthritis load, discussed possible ortho referral.  ? ?  ?  ? Relevant Orders  ? DG Knee Complete 4 Views Left  ?  ? ?No orders of the defined types were placed in this encounter. ? ?Orders Placed This Encounter  ?Procedures  ? DG Knee Complete 4 Views Left  ?  Standing Status:   Future  ?  Number of Occurrences:   1  ?  Standing Expiration Date:  12/28/2022  ?  Order Specific Question:   Reason for Exam (SYMPTOM  OR DIAGNOSIS REQUIRED)  ?  Answer:   chronic left knee pain  ?  Order Specific Question:   Preferred imaging location?  ?  Answer:   [facility]  ? POCT glycosylated hemoglobin (Hb A1C)  ? ? ? ?Patient Instructions  ?Return in 4 months for wellness visit/physical and labs.  ?Good to see you today.  ?Left knee xray today  ?Continue current medicines.  ?Reach out to Dr Cruzita Lederer to talk about ozempic.  ? ?Follow up plan: ?Return in about 4 months (around 04/29/2022) for annual exam, prior fasting for blood work, medicare wellness visit. ? ?[name], MD   ?

## 2021-12-27 NOTE — Patient Instructions (Addendum)
Return in 4 months for wellness visit/physical and labs.  ?Good to see you today.  ?Left knee xray today  ?Continue current medicines.  ?Reach out to Dr Cruzita Lederer to talk about ozempic.  ?

## 2022-01-02 ENCOUNTER — Other Ambulatory Visit: Payer: Self-pay | Admitting: Family Medicine

## 2022-01-02 NOTE — Telephone Encounter (Signed)
Refill request Cialis ?Last refill 12/02/21 #5/4 ?Last office visit 12/27/21 ?

## 2022-01-03 ENCOUNTER — Telehealth: Payer: Self-pay

## 2022-01-03 NOTE — Progress Notes (Signed)
? ? ?Chronic Care Management ?Pharmacy Assistant  ? ?Name: Anthony [name]  MRN: [id number] DOB: [birth year] ? ?Reason for Encounter: CCM (Medication Adherence and Delivery Coordination) ?  ?Recent office visits:  ?12/27/21 [name], MD Diabetes: A1c 7.0 Stop (patient stopped due to anorexia) Ozempic. Patient is to contact Endo for discussing instead of stopping meds.  ? ?Recent consult visits:  ?12/16/21 Acquanetta Sit, DPM (Podiatry): Nail problem. Procedure: Toenails 1-5 b/l were debrided  ? ?Hospital visits:  ?None in previous 6 months ? ?Medications: ?Outpatient Encounter Medications as of 01/03/2022  ?Medication Sig  ? amLODipine (NORVASC) 5 MG tablet TAKE ONE TABLET BY MOUTH ONCE DAILY  ? b complex vitamins tablet Take 1 tablet by mouth daily.  ? blood glucose meter kit and supplies KIT Dispense based on patient and insurance preference. Use up to four times daily as directed.  ? Continuous Blood Gluc Transmit (DEXCOM G6 TRANSMITTER) MISC 1 Device by Does not apply route every 3 (three) months.  ? Dextrose, Diabetic Use, (GLUCOSE PO) Take 1 tablet by mouth as needed.  ? glucose blood (ONETOUCH ULTRA) test strip USE AS DIRECTED FOUR TIMES DAILY  ? GVOKE HYPOPEN 1-PACK 1 MG/0.2ML SOAJ Inject under skin 0.2 mL as needed for hypoglycemia  ? insulin degludec (TRESIBA FLEXTOUCH) 200 UNIT/ML FlexTouch Pen INJECT 32U into SUBCUTANEOUSLY DAILY  ? Insulin Pen Needle (ADVOCATE INSULIN PEN NEEDLES) 31G X 5 MM MISC Use 3x a day  ? metFORMIN (GLUCOPHAGE-XR) 500 MG 24 hr tablet Take 1 tablet (500 mg total) by mouth daily with breakfast.  ? NOVOLOG FLEXPEN 100 UNIT/ML FlexPen INJECT 10-18 UNITS into THE SKIN THREE TIMES DAILY with meals  ? olmesartan-hydrochlorothiazide (BENICAR HCT) 40-25 MG tablet TAKE 1 TABLET BY MOUTH EVERY DAY  ? OZEMPIC, 1 MG/DOSE, 4 MG/3ML SOPN Inject 1 mg into the skin once a week.  ? pantoprazole (PROTONIX) 40 MG tablet TAKE ONE TABLET BY MOUTH ONCE DAILY  ? simvastatin (ZOCOR) 20 MG tablet  TAKE 1 TABLET BY MOUTH EVERYDAY AT BEDTIME  ? tadalafil (CIALIS) 20 MG tablet TAKE ONE-HALF TO 1 TABLET BY MOUTH EVERY OTHER DAY AS NEEDED FOR erectile dysfunction  ? ?No facility-administered encounter medications on file as of 01/03/2022.  ? ?BP Readings from Last 3 Encounters:  ?12/27/21 132/72  ?10/24/21 128/78  ?08/08/21 112/70  ?  ?Lab Results  ?Component Value Date  ? HGBA1C 7.0 (A) 12/27/2021  ?  ?Recent OV, Consult or Hospital visit:  ?Recent medication changes indicated:  ? Stop (patient stopped due to anorexia) Ozempic. Patient is to contact Endo for discussing instead of stopping meds.  ? ?Last adherence delivery date: 12/15/2021     ? ?Patient is due for next adherence delivery on: 01/13/2022 ? ?Spoke with patient on 01/04/2022 reviewed medications and coordinated delivery. ? ?VIAL medications: ?Simvastatin 20 mg Take 1 tablet every day at bedtime ?Tadalafil 20 mg - 1/2 to 1 tablet daily as needed- PRN use ?Olmesartan/HCTZ 40-25 mg Take 1 tablet by mouth every day ?Amlodipine 5 mg 1 tablet daily ?Pantoprazole   40 mg 1 tablet daily ?OneTouch Test strips - Use up to four times daily ?Novolog Flexpen Inject 10-18 units into the skin 3 times daily with meals ?Tresiba Flex 200 units- 32 units once daily in morning  ?Gvoke Hypopen - 1 mg - Inject 0.2 mL as needed ?Ozempic 1 mg/dose - Inject once weekly  ? ?Patient declined the following medications this month: ?Metformin 500 mg ER - take 1 tablet every morning (  pt has excess supply as RX was for 3 a day now takes 1 ?  ?Any concerns about your medications? No ?  ?How often do you forget or accidentally miss a dose? Never ?  ?Do you use a pillbox? No ?  ?Is patient in packaging No ?             ?Refills requested from providers include: ?Tadalafil 20 mg - 1/2 to 1 tablet daily as needed- PRN use ? ?Confirmed delivery date of 01/13/2022, advised patient that pharmacy will contact them the morning of delivery. ? ?Recent blood pressure readings are as follows:  Patient does not take blood pressure at home.  ? ?Recent blood glucose readings are as follows: Patient did not have any readings with him. ? ?Annual wellness visit in last year? Yes 07/26/2021 ?Most Recent BP reading: 132/72 on 12/27/2021 ?  ?If Diabetic: ?Most recent A1C reading: 7.0 on 12/27/2021 ?Last eye exam / retinopathy screening: Up to date ?Last diabetic foot exam: Up to date ?  ?Charlene Brooke, CPP notified ?  ?[name], RMA ?Clinical Pharmacy Assistant ?[phone] ?

## 2022-01-04 NOTE — Telephone Encounter (Signed)
Called pt 3 times, the first 2 he answered and asked me to call back later, the next I got his voicemail and lvm for to call back and schedule ?

## 2022-01-05 ENCOUNTER — Other Ambulatory Visit: Payer: Self-pay | Admitting: Family Medicine

## 2022-01-05 DIAGNOSIS — G8929 Other chronic pain: Secondary | ICD-10-CM

## 2022-01-05 NOTE — Progress Notes (Signed)
Ortho referral placed

## 2022-01-11 ENCOUNTER — Telehealth: Payer: Self-pay

## 2022-01-11 NOTE — Progress Notes (Signed)
? ? ?  Chronic Care Management ?Pharmacy Assistant  ? ?Name: Anthony [name]  MRN: [id number] DOB: 19-Jul-1946 ? ?Reason for Encounter: CCM (Dexcom G6 issues) ?  ?Patient called in regards to getting a replacement for his Dexcom G6. I called patient to give him [facility] [phone]) phone number. Called patient and left a message with patient's wife.  ? ?Charlene Brooke, CPP notified ? ?[name], RMA ?Clinical Pharmacy Assistant ?[phone] ? ? ?

## 2022-01-16 ENCOUNTER — Ambulatory Visit (INDEPENDENT_AMBULATORY_CARE_PROVIDER_SITE_OTHER): Payer: Medicare HMO

## 2022-01-16 DIAGNOSIS — I10 Essential (primary) hypertension: Secondary | ICD-10-CM

## 2022-01-16 DIAGNOSIS — E113299 Type 2 diabetes mellitus with mild nonproliferative diabetic retinopathy without macular edema, unspecified eye: Secondary | ICD-10-CM

## 2022-01-16 DIAGNOSIS — R413 Other amnesia: Secondary | ICD-10-CM

## 2022-01-16 NOTE — Chronic Care Management (AMB) (Signed)
Chronic Care Management   CCM RN Visit Note  01/16/2022 Name: Anthony [name] MRN: [id number] DOB: Jul 09, 1946  Subjective: Anthony [name] is a 76 y.o. year old male who is a primary care patient of Anthony Bush, MD. The care management team was consulted for assistance with disease management and care coordination needs.    Engaged with patient by telephone for follow up visit in response to provider referral for case management and/or care coordination services.   Consent to Services:  The patient was given information about Chronic Care Management services, agreed to services, and gave verbal consent prior to initiation of services.  Please see initial visit note for detailed documentation.   Patient agreed to services and verbal consent obtained.   Assessment: Review of patient past medical history, allergies, medications, health status, including review of consultants reports, laboratory and other test data, was performed as part of comprehensive evaluation and provision of chronic care management services.   SDOH (Social Determinants of Health) assessments and interventions performed:    CCM Care Plan  No Known Allergies  Outpatient Encounter Medications as of 01/16/2022  Medication Sig   amLODipine (NORVASC) 5 MG tablet TAKE ONE TABLET BY MOUTH ONCE DAILY   b complex vitamins tablet Take 1 tablet by mouth daily.   blood glucose meter kit and supplies KIT Dispense based on patient and insurance preference. Use up to four times daily as directed.   Continuous Blood Gluc Transmit (DEXCOM G6 TRANSMITTER) MISC 1 Device by Does not apply route every 3 (three) months.   Dextrose, Diabetic Use, (GLUCOSE PO) Take 1 tablet by mouth as needed.   glucose blood (ONETOUCH ULTRA) test strip USE AS DIRECTED FOUR TIMES DAILY   GVOKE HYPOPEN 1-PACK 1 MG/0.2ML SOAJ Inject under skin 0.2 mL as needed for hypoglycemia   insulin degludec (TRESIBA FLEXTOUCH) 200 UNIT/ML FlexTouch Pen INJECT  32U into SUBCUTANEOUSLY DAILY   Insulin Pen Needle (ADVOCATE INSULIN PEN NEEDLES) 31G X 5 MM MISC Use 3x a day   metFORMIN (GLUCOPHAGE-XR) 500 MG 24 hr tablet Take 1 tablet (500 mg total) by mouth daily with breakfast.   NOVOLOG FLEXPEN 100 UNIT/ML FlexPen INJECT 10-18 UNITS into THE SKIN THREE TIMES DAILY with meals   olmesartan-hydrochlorothiazide (BENICAR HCT) 40-25 MG tablet TAKE 1 TABLET BY MOUTH EVERY DAY   OZEMPIC, 1 MG/DOSE, 4 MG/3ML SOPN Inject 1 mg into the skin once a week.   pantoprazole (PROTONIX) 40 MG tablet TAKE ONE TABLET BY MOUTH ONCE DAILY   simvastatin (ZOCOR) 20 MG tablet TAKE 1 TABLET BY MOUTH EVERYDAY AT BEDTIME   tadalafil (CIALIS) 20 MG tablet TAKE ONE-HALF TO 1 TABLET BY MOUTH EVERY OTHER DAY AS NEEDED FOR erectile dysfunction   No facility-administered encounter medications on file as of 01/16/2022.    Patient Active Problem List   Diagnosis Date Noted   Chronic pain of left knee 12/27/2021   Type 2 diabetes mellitus with hypoglycemia ([place]) 03/10/2021   Memory impairment 02/10/2021   Non-adherence to medical treatment 10/27/2020   COPD (chronic obstructive pulmonary disease) ([place]) 02/28/2020   Coronary artery calcification seen on CAT scan 02/28/2020   Aortic atherosclerosis ([place]) 02/28/2020   Ex-smoker 01/21/2020   RLQ abdominal pain 01/21/2020   Numbness and tingling in left arm 01/21/2020   Health maintenance examination 01/05/2017   Right knee pain 01/18/2016   Barrett's esophagus 12/08/2015   Abnormal tympanic membrane of right ear 07/05/2015   Advanced care planning/counseling discussion 06/30/2014   Medicare annual  wellness visit, subsequent 06/22/2013   Benign prostatic hyperplasia with urinary obstruction 06/18/2013   ED (erectile dysfunction) 04/10/2012   Hyperlipidemia associated with type 2 diabetes mellitus ([place]) 12/29/2008   LEG PAIN, BILATERAL 11/19/2008   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 ([place])  07/24/2007   ANXIETY 07/24/2007   GERD 07/24/2007    Conditions to be addressed/monitored:HTN, DMII, and memory impairment  Care Plan : [facility] plan of care  Updates made by Dannielle Karvonen, RN since 01/16/2022 12:00 AM     Problem: Chronic disese management education and / or care coordination needs.( Diabetes, Hypertension)   Priority: High     Long-Range Goal: Development of plan of care to address chronic disease management and / or care coordination needs ( DMII, Hypertension)   Expected End Date: 02/24/2022  Priority: High  Note:   Current Barriers:  Knowledge Deficits related to plan of care for management of HTN and DMII  Chronic Disease Management support and education needs related to HTN and DMII Cognitive Deficits Memory impairment Patient states  he is doing well. He reports his blood sugars have not dropped below 70 since last telephone outreach with RNCM.  He states he blood sugars are running " a little high."  Patient reports today's  fasting blood sugar was 235.  Patient reports having a few blood sugars ranging from 300-400.  RNCM advised patient to notify his endocrinologist of recurring elevated blood sugars.  Patient verbalized understanding.  Patient reports seeing his primary care provider on 12/27/21.  Denies any change in treatment plan.  Patient states his primary care provider referred him to an orthopedic doctor for his left knee pain. Patient states he is scheduled to see the orthopedic on tomorrow 01/17/21.   Patient inquired if medicare care would assist in paying for his DEXCOM pocket sensor if it was damaged.  Patient states he accidentally drove his lawnmower into his pond and his DEXCOM sensor was in his pocket.  Patient states he spoke with the [facility].  Patient advised to call Medicare for advisement of coverage.  Patient confirms his Benjie Karvonen is currently working and continues to notify him of his readings and alerts.  Per chart review patients next  endocrinology appointment is 01/24/22.  RNCM Clinical Goal(s):  Patient will verbalize understanding of plan for management of HTN and DMII take all medications exactly as prescribed and will call provider for medication related questions attend all scheduled medical appointments:  continue to work with RN Care Manager to address care management and care coordination needs related to  HTN and DMII through collaboration with RN Care manager, provider, and care team.  patient will demonstrate improved adherence to prescribed treatment plan for diabetes self care/management as evidenced by: daily monitoring and recording of CBG  adherence to ADA/ carb modified diet adherence to prescribed medication regimen contacting provider for new or worsened symptoms or questions:   Interventions: 1:1 collaboration with primary care provider regarding development and update of comprehensive plan of care as evidenced by provider attestation and co-signature Inter-disciplinary care team collaboration (see longitudinal plan of care) Evaluation of current treatment plan related to  self management and patient's adherence to plan as established by provider  Hypertension Interventions: Goal on track: Yes Last practice recorded BP readings:  BP Readings from Last 3 Encounters:  12/27/21 132/72  10/24/21 128/78  08/08/21 112/70  Most recent eGFR/CrCl: No results found for: EGFR  No components found for: CRCL  Evaluation of current  treatment plan related to hypertension self management and patient's adherence to plan as established by provider; Reviewed medications with patient and discussed importance of compliance; Discussed plans with patient for ongoing care management follow up and provided patient with direct contact information for care management team; Reviewed scheduled/upcoming provider appointments  Monitor blood pressure at least 2-3 times per month Advised to follow a low salt diet.   Memory  impairment interventions: Goal on track:  Yes. Long term Evaluation of current treatment plan related to  memory impairment , self-management and patient's adherence to plan as established by provider. Discussed plans with patient for ongoing care management follow up and provided patient with direct contact information for care management team Reviewed medications with patient and discussed  Reviewed scheduled/upcoming provider appointments  Discussed with patient memory impairment self care strategies Discussed caregiver support  Diabetes Interventions: Goal on tract: Yes,  Long term Assessed patient's understanding of A1c goal: <7% Evaluation of current treatment plan related to memory impairment, self-management and patient's adherence to plan as established by provider. Discussed plans with patient for ongoing care management follow up and provided patient with direct contact information for care management team Advised patient to notify endocrinologist of  his ongoing/ frequent hypoglycemic events as soon as possible. Patients next appointment with endocrinologist is 01/24/22.  Advised to follow a low carbohydrate/ diabetic diet.  Reviewed signs/ symptoms of hyperglycemia/ hypoglycemia and treatment   Lab Results  Component Value Date   HGBA1C 7.0 (A) 12/27/2021     Patient Goals/Self-Care Activities: Continue to take medications as prescribed Notify your endocrinologist of your frequent low and/ or high blood sugar readings and increase in activity.  Follow up with providers as recommended.  Call pharmacy / pharmacy programs for medication refills  Call provider office for new concerns or questions Apply memory impairment strategies:  Socialize regularly, get a good nights sleep, eat a healthy diet, read,  and games such as ( sudoku, crossword puzzles) Check blood pressure 2-3 times per month Reminder:  Consider having family member accompany you to your appointments and assist  with medication reminders Continue to check your blood sugar as directed by your provider, take your blood sugar readings to your provider appointment Call Medicare to inquire of coverage for Damaged DEXCOM sensor.     Plan:The patient has been provided with contact information for the care management team and has been advised to call with any health related questions or concerns.  The care management team will reach out to the patient again over the next 45 days. Quinn Plowman RN,BSN,CCM RN Case Manager [place]  [phone]

## 2022-01-16 NOTE — Patient Instructions (Signed)
Visit Information  Thank you for taking time to visit with me today. Please don't hesitate to contact me if I can be of assistance to you before our next scheduled telephone appointment.  Following are the goals we discussed today:  Continue to take medications as prescribed Notify your endocrinologist of your frequent low and/ or high blood sugar readings and increase in activity.  Follow up with providers as recommended.  Call pharmacy / pharmacy programs for medication refills  Call provider office for new concerns or questions Apply memory impairment strategies:  Socialize regularly, get a good nights sleep, eat a healthy diet, read,  and games such as ( sudoku, crossword puzzles) Check blood pressure 2-3 times per month Reminder:  Consider having family member accompany you to your appointments and assist with medication reminders Continue to check your blood sugar as directed by your provider, take your blood sugar readings to your provider appointment Call Medicare to inquire of coverage for Damaged DEXCOM sensor.  Our next appointment is by telephone on 02/16/22 at 2:00 pm  Please call the care guide team at [phone] if you need to cancel or reschedule your appointment.   If you are experiencing a Mental Health or [place] or need someone to talk to, please call the Suicide and Crisis Lifeline: 988 call 1-800-273-TALK (toll free, 24 hour hotline)   The patient verbalized understanding of instructions, educational materials, and care plan provided today and agreed to receive a mailed copy of patient instructions, educational materials, and care plan.   Quinn Plowman RN,BSN,CCM RN Case Manager [place]  [phone]

## 2022-01-17 ENCOUNTER — Ambulatory Visit: Payer: Medicare HMO | Admitting: Orthopaedic Surgery

## 2022-01-17 ENCOUNTER — Ambulatory Visit (INDEPENDENT_AMBULATORY_CARE_PROVIDER_SITE_OTHER): Payer: Medicare HMO

## 2022-01-17 ENCOUNTER — Encounter: Payer: Self-pay | Admitting: Orthopaedic Surgery

## 2022-01-17 VITALS — Ht 72.0 in | Wt 217.0 lb

## 2022-01-17 DIAGNOSIS — E11649 Type 2 diabetes mellitus with hypoglycemia without coma: Secondary | ICD-10-CM | POA: Diagnosis not present

## 2022-01-17 DIAGNOSIS — M1712 Unilateral primary osteoarthritis, left knee: Secondary | ICD-10-CM

## 2022-01-17 DIAGNOSIS — M1711 Unilateral primary osteoarthritis, right knee: Secondary | ICD-10-CM

## 2022-01-17 DIAGNOSIS — Z794 Long term (current) use of insulin: Secondary | ICD-10-CM | POA: Diagnosis not present

## 2022-01-17 NOTE — Progress Notes (Signed)
Office Visit Note   Patient: Anthony [name]           Date of Birth: 08-07-1946           MRN: [id number] Visit Date: 01/17/2022              Requested by: [name], MD [name],  [place] [id number] PCP: [name], MD   Assessment & Plan: Visit Diagnoses:  1. Primary osteoarthritis of left knee   2. Type 2 diabetes mellitus with hypoglycemia without coma, with long-term current use of insulin ([place])   3. Primary osteoarthritis of right knee     Plan: Impression is left greater than right advanced tricompartmental knee DJD.  Treatment options were again reviewed and based on his options he is elected for a left total knee replacement due to the fact that he is severely limited with ADLs and has chronic pain including during nighttime.  His diabetes is well controlled with A1c of 7.0.  Denies nickel allergy.  We will obtain preoperative medical clearance from Dr. Danise Mina.  Jackelyn Poling will call the patient to schedule surgery.  For the right knee we will treat this symptomatically for now.  Follow-Up Instructions: No follow-ups on file.   Orders:  Orders Placed This Encounter  Procedures   XR KNEE 3 VIEW RIGHT   No orders of the defined types were placed in this encounter.     Procedures: No procedures performed   Clinical Data: No additional findings.   Subjective: Chief Complaint  Patient presents with   Left Knee - Pain   Right Knee - Pain    HPI Anthony [name] is a very pleasant 76 year old gentleman here for evaluation of left greater than right knee pain for 2 years.  He feels constant pain and popping and giving way.  He has had cortisone injections in the past without significant relief.  He is currently retired and takes care of his wife who has dementia. Review of Systems  Constitutional: Negative.   All other systems reviewed and are negative.   Objective: Vital Signs: Ht 6' (1.829 m)   Wt 217 lb (98.4 kg)   BMI 29.43 kg/m    Physical Exam Vitals and nursing note reviewed.  Constitutional:      Appearance: He is well-developed.  HENT:     Head: Normocephalic and atraumatic.  Eyes:     Pupils: Pupils are equal, round, and reactive to light.  Pulmonary:     Effort: Pulmonary effort is normal.  Abdominal:     Palpations: Abdomen is soft.  Musculoskeletal:        General: Normal range of motion.     Cervical back: Neck supple.  Skin:    General: Skin is warm.  Neurological:     Mental Status: He is alert and oriented to person, place, and time.  Psychiatric:        Behavior: Behavior normal.        Thought Content: Thought content normal.        Judgment: Judgment normal.    Ortho Exam Examination of bilateral knees show 2+ crepitus throughout arc of motion.  There is pain and limitation in range of motion as well.  Collaterals and cruciates are stable. Specialty Comments:  No specialty comments available.  Imaging: XR KNEE 3 VIEW RIGHT  Result Date: 01/17/2022 Advanced tricompartmental degenerative joint disease.  Bone-on-bone joint space narrowing.    PMFS History: Patient Active Problem List  Diagnosis Date Noted   Primary osteoarthritis of right knee 01/17/2022   Chronic pain of left knee 12/27/2021   Type 2 diabetes mellitus with hypoglycemia ([place]) 03/10/2021   Memory impairment 02/10/2021   Non-adherence to medical treatment 10/27/2020   COPD (chronic obstructive pulmonary disease) ([place]) 02/28/2020   Coronary artery calcification seen on CAT scan 02/28/2020   Aortic atherosclerosis ([place]) 02/28/2020   Ex-smoker 01/21/2020   RLQ abdominal pain 01/21/2020   Numbness and tingling in left arm 01/21/2020   Health maintenance examination 01/05/2017   Right knee pain 01/18/2016   Barrett's esophagus 12/08/2015   Abnormal tympanic membrane of right ear 07/05/2015   Advanced care planning/counseling discussion 06/30/2014   Medicare annual wellness visit, subsequent 06/22/2013   Benign  prostatic hyperplasia with urinary obstruction 06/18/2013   ED (erectile dysfunction) 04/10/2012   Hyperlipidemia associated with type 2 diabetes mellitus ([place]) 12/29/2008   LEG PAIN, BILATERAL 11/19/2008   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 ([place]) 07/24/2007   ANXIETY 07/24/2007   GERD 07/24/2007   Past Medical History:  Diagnosis Date   Anxiety    denies   Arthritis    a little   Barrett's esophagus 12/08/2015   By EGD 2010 [name])    BPH with obstruction/lower urinary tract symptoms 2014   s/p TURP   Depression    denies   DKA (diabetic ketoacidoses) 01/2010   "in coma"   GERD (gastroesophageal reflux disease)    H/O bronchitis    after every cold   HTN (hypertension)    Hyperlipidemia    controlled with medicine   Otitis media    Rheumatic fever    maybe as child   Type 2 diabetes, uncontrolled, with mild 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 macular edema    completed DMSE    Family History  Problem Relation Age of Onset   Lymphoma Mother        Non-hodgkins   Coronary artery disease Mother    Heart disease Mother    Cancer Father    Alcohol abuse Father    Diabetes Brother    Pancreatic cancer Maternal Uncle    Diabetes Brother    Diabetes Brother    Diabetes Other    Colon cancer Neg Hx    Lung cancer Neg Hx    Esophageal cancer Neg Hx    Rectal cancer Neg Hx    Stomach cancer Neg Hx     Past Surgical History:  Procedure Laterality Date   ABI  12/2013   WNL   APPENDECTOMY     CATARACT EXTRACTION W/PHACO Right 12/01/2020   Procedure: CATARACT EXTRACTION PHACO AND INTRAOCULAR LENS PLACEMENT ([place])  RIGHT VIVITY TORIC LENS DIABETIC;  Surgeon: Leandrew Koyanagi, MD;  Location: [place] [facility];  Service: Ophthalmology;  Laterality: Right;  6.93 1:12.8 9.5%   CATARACT EXTRACTION W/PHACO Left 12/15/2020   Procedure: CATARACT EXTRACTION PHACO AND INTRAOCULAR LENS PLACEMENT ([place])  VIVITY TORIC LENS DIABETIC  LEFT EYE;  Surgeon: Leandrew Koyanagi, MD;  Location: [facility];  Service: Ophthalmology;  Laterality: Left;  3.47 1:03.8 5.5%   CHOLECYSTECTOMY     years ago   COLONOSCOPY  01/2009   TA, rec rpt 5 yrs [name])   COLONOSCOPY  01/2018   TAx2, diverticulosis, int hemorrhoids, rpt 5 yrs ([name])   ESOPHAGOGASTRODUODENOSCOPY  01/2009   biopsy with Barrett's   ESOPHAGOGASTRODUODENOSCOPY  01/2018   WNL - no barrett's ([name])   INGUINAL HERNIA REPAIR Bilateral 03/04/2020   Procedure:  LAPAROSCOPIC BILATERAL INGUINAL HERNIA REPAIR WITH MESH;  Surgeon: [name], MD;  Location: [place];  Service: General;  Laterality: Bilateral;   LAPAROSCOPIC APPENDECTOMY  07/05/2011   Procedure: APPENDECTOMY LAPAROSCOPIC;  Surgeon: [name], MD;  Location: [place];  Service: General;  Laterality: N/A;   MENISCUS REPAIR Right 2018   [name] @ [facility]   TRANSURETHRAL RESECTION OF PROSTATE N/A 07/28/2013   Procedure: TRANSURETHRAL RESECTION OF THE PROSTATE WITH GYRUS INSTRUMENTS;  Surgeon: [name], MD   VASECTOMY     WISDOM TOOTH EXTRACTION     Social History   Occupational History   Not on file  Tobacco Use   Smoking status: Former    Packs/day: 0.30    Years: 50.00    Pack years: 15.00    Types: Cigarettes    Quit date: 08/29/2011    Years since quitting: 10.3   Smokeless tobacco: Never  Vaping Use   Vaping Use: Former  Substance and Sexual Activity   Alcohol use: Yes    Alcohol/week: 7.0 standard drinks    Types: 7 Standard drinks or equivalent per week    Comment: 1- 2 ounces a night   Drug use: No   Sexual activity: Not Currently

## 2022-01-20 DIAGNOSIS — E113291 Type 2 diabetes mellitus with mild nonproliferative diabetic retinopathy without macular edema, right eye: Secondary | ICD-10-CM | POA: Diagnosis not present

## 2022-01-20 DIAGNOSIS — Z794 Long term (current) use of insulin: Secondary | ICD-10-CM | POA: Diagnosis not present

## 2022-01-24 ENCOUNTER — Ambulatory Visit: Payer: Medicare HMO | Admitting: Internal Medicine
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
## 2022-01-25 DIAGNOSIS — Z794 Long term (current) use of insulin: Secondary | ICD-10-CM | POA: Diagnosis not present

## 2022-01-25 DIAGNOSIS — Z7984 Long term (current) use of oral hypoglycemic drugs: Secondary | ICD-10-CM | POA: Diagnosis not present

## 2022-01-25 DIAGNOSIS — Z87891 Personal history of nicotine dependence: Secondary | ICD-10-CM

## 2022-01-25 DIAGNOSIS — I10 Essential (primary) hypertension: Secondary | ICD-10-CM | POA: Diagnosis not present

## 2022-01-25 DIAGNOSIS — E1169 Type 2 diabetes mellitus with other specified complication: Secondary | ICD-10-CM

## 2022-02-01 ENCOUNTER — Other Ambulatory Visit: Payer: Self-pay | Admitting: Family Medicine

## 2022-02-02 ENCOUNTER — Telehealth: Payer: Self-pay

## 2022-02-02 NOTE — Progress Notes (Signed)
Chronic Care Management Pharmacy Assistant   Name: Anthony [name]  MRN: [id number] DOB: [birth year]  Reason for Encounter: CCM (Medication Adherence and Delivery Coordination)  CCP REVIEW  Recent office visits:  None since last CCM contact  Recent consult visits:  01/17/22 Frankey Shown, MD (Orthopedic Surgery) Osteoarthritis of left knee Ordered: XR Knee No med changes  Hospital visits:  None in previous 6 months  Medications: Outpatient Encounter Medications as of 02/02/2022  Medication Sig   amLODipine (NORVASC) 5 MG tablet TAKE ONE TABLET BY MOUTH ONCE DAILY   b complex vitamins tablet Take 1 tablet by mouth daily.   blood glucose meter kit and supplies KIT Dispense based on patient and insurance preference. Use up to four times daily as directed.   Continuous Blood Gluc Transmit (DEXCOM G6 TRANSMITTER) MISC 1 Device by Does not apply route every 3 (three) months.   Dextrose, Diabetic Use, (GLUCOSE PO) Take 1 tablet by mouth as needed.   glucose blood (ONETOUCH ULTRA) test strip USE AS DIRECTED FOUR TIMES DAILY   GVOKE HYPOPEN 1-PACK 1 MG/0.2ML SOAJ Inject under skin 0.2 mL as needed for hypoglycemia   insulin degludec (TRESIBA FLEXTOUCH) 200 UNIT/ML FlexTouch Pen INJECT 32U into SUBCUTANEOUSLY DAILY   Insulin Pen Needle (ADVOCATE INSULIN PEN NEEDLES) 31G X 5 MM MISC Use 3x a day   metFORMIN (GLUCOPHAGE-XR) 500 MG 24 hr tablet Take 1 tablet (500 mg total) by mouth daily with breakfast.   NOVOLOG FLEXPEN 100 UNIT/ML FlexPen INJECT 10-18 UNITS into THE SKIN THREE TIMES DAILY with meals   olmesartan-hydrochlorothiazide (BENICAR HCT) 40-25 MG tablet TAKE 1 TABLET BY MOUTH EVERY DAY   OZEMPIC, 1 MG/DOSE, 4 MG/3ML SOPN Inject 1 mg into the skin once a week.   pantoprazole (PROTONIX) 40 MG tablet TAKE ONE TABLET BY MOUTH ONCE DAILY   simvastatin (ZOCOR) 20 MG tablet TAKE 1 TABLET BY MOUTH EVERYDAY AT BEDTIME   tadalafil (CIALIS) 20 MG tablet TAKE 1/2 TO 1 TABLET BY MOUTH every  other DAY as needed FOR erectile dysfunction   No facility-administered encounter medications on file as of 02/02/2022.   BP Readings from Last 3 Encounters:  12/27/21 132/72  10/24/21 128/78  08/08/21 112/70    Lab Results  Component Value Date   HGBA1C 7.0 (A) 12/27/2021    Recent OV, Consult or Hospital visit:  No medication changes indicated  Last adherence delivery date: 01/13/2022      Patient is due for next adherence delivery on: 02/14/2022  Spoke with patient on 02/14/2022 reviewed medications and coordinated delivery.  VIAL medications: Simvastatin 20 mg Take 1 tablet every day at bedtime Tadalafil 20 mg - 1/2 to 1 tablet daily as needed- PRN use Olmesartan/HCTZ 40-25 mg Take 1 tablet by mouth every day Amlodipine 5 mg 1 tablet daily Pantoprazole   40 mg 1 tablet daily OneTouch Test strips - Use up to four times daily Novolog Flexpen Inject 10-18 units into the skin 3 times daily with meals Tresiba Flex 200 units- 32 units once daily in morning  Gvoke Hypopen - 1 mg - Inject 0.2 mL as needed Ozempic 1 mg/dose - Inject once weekly    Patient declined the following medications this month: Metformin 500 mg ER - take 1 tablet every morning (pt has excess supply as RX was for 3 a day now takes 1   Any concerns about your medications? No   How often do you forget or accidentally miss a dose? Never  Do you use a pillbox? No   Is patient in packaging No              Refills requested from providers include: Tadalafil 20 mg - 1/2 to 1 tablet daily as needed- PRN use Amlodipine 5 mg 1 tablet daily Pantoprazole   40 mg 1 tablet daily  Confirmed delivery date of 02/14/2022, advised patient that pharmacy will contact them the morning of delivery. 06/20  Recent blood pressure readings are as follows: Patient does not take blood pressure at home.   Recent blood glucose readings are as follows: No readings on hand  Annual wellness visit in last year?  Yes  07/26/2021 Most Recent BP reading: 132/72 on 12/27/2021   If Diabetic: Most recent A1C reading: 7.0 on 12/27/2021 Last eye exam / retinopathy screening: Up to date Last diabetic foot exam: Up to date   Charlene Brooke, CPP notified   [name], [facility] Assistant [phone]

## 2022-02-16 ENCOUNTER — Ambulatory Visit (INDEPENDENT_AMBULATORY_CARE_PROVIDER_SITE_OTHER): Payer: Medicare HMO

## 2022-02-16 DIAGNOSIS — I10 Essential (primary) hypertension: Secondary | ICD-10-CM

## 2022-02-16 DIAGNOSIS — E113299 Type 2 diabetes mellitus with mild nonproliferative diabetic retinopathy without macular edema, unspecified eye: Secondary | ICD-10-CM

## 2022-02-16 DIAGNOSIS — R413 Other amnesia: Secondary | ICD-10-CM

## 2022-02-16 NOTE — Chronic Care Management (AMB) (Cosign Needed)
Chronic Care Management   CCM RN Visit Note  02/16/2022 Name: Anthony [name] MRN: [id number] DOB: [birth year]  Subjective: Anthony [name] is a 76 y.o. year old male who is a primary care patient of [name], MD. The care management team was consulted for assistance with disease management and care coordination needs.    Engaged with patient by telephone for follow up visit in response to provider referral for case management and/or care coordination services.   Consent to Services:  The patient was given information about Chronic Care Management services, agreed to services, and gave verbal consent prior to initiation of services.  Please see initial visit note for detailed documentation.   Patient agreed to services and verbal consent obtained.   Assessment: Review of patient past medical history, allergies, medications, health status, including review of consultants reports, laboratory and other test data, was performed as part of comprehensive evaluation and provision of chronic care management services.   SDOH (Social Determinants of Health) assessments and interventions performed:    CCM Care Plan  No Known Allergies  Outpatient Encounter Medications as of 02/16/2022  Medication Sig   amLODipine (NORVASC) 5 MG tablet TAKE ONE TABLET BY MOUTH ONCE DAILY   b complex vitamins tablet Take 1 tablet by mouth daily.   blood glucose meter kit and supplies KIT Dispense based on patient and insurance preference. Use up to four times daily as directed.   Continuous Blood Gluc Transmit (DEXCOM G6 TRANSMITTER) MISC 1 Device by Does not apply route every 3 (three) months.   Dextrose, Diabetic Use, (GLUCOSE PO) Take 1 tablet by mouth as needed.   glucose blood (ONETOUCH ULTRA) test strip USE AS DIRECTED FOUR TIMES DAILY   GVOKE HYPOPEN 1-PACK 1 MG/0.2ML SOAJ Inject under skin 0.2 mL as needed for hypoglycemia   insulin degludec (TRESIBA FLEXTOUCH) 200 UNIT/ML FlexTouch Pen INJECT  32U into SUBCUTANEOUSLY DAILY   Insulin Pen Needle (ADVOCATE INSULIN PEN NEEDLES) 31G X 5 MM MISC Use 3x a day   metFORMIN (GLUCOPHAGE-XR) 500 MG 24 hr tablet Take 1 tablet (500 mg total) by mouth daily with breakfast.   NOVOLOG FLEXPEN 100 UNIT/ML FlexPen INJECT 10-18 UNITS into THE SKIN THREE TIMES DAILY with meals   olmesartan-hydrochlorothiazide (BENICAR HCT) 40-25 MG tablet TAKE 1 TABLET BY MOUTH EVERY DAY   OZEMPIC, 1 MG/DOSE, 4 MG/3ML SOPN Inject 1 mg into the skin once a week.   pantoprazole (PROTONIX) 40 MG tablet TAKE ONE TABLET BY MOUTH ONCE DAILY   simvastatin (ZOCOR) 20 MG tablet TAKE 1 TABLET BY MOUTH EVERYDAY AT BEDTIME   tadalafil (CIALIS) 20 MG tablet TAKE 1/2 TO 1 TABLET BY MOUTH every other DAY as needed FOR erectile dysfunction   No facility-administered encounter medications on file as of 02/16/2022.    Patient Active Problem List   Diagnosis Date Noted   Primary osteoarthritis of right knee 01/17/2022   Chronic pain of left knee 12/27/2021   Type 2 diabetes mellitus with hypoglycemia ([place]) 03/10/2021   Memory impairment 02/10/2021   Non-adherence to medical treatment 10/27/2020   COPD (chronic obstructive pulmonary disease) ([place]) 02/28/2020   Coronary artery calcification seen on CAT scan 02/28/2020   Aortic atherosclerosis ([place]) 02/28/2020   Ex-smoker 01/21/2020   RLQ abdominal pain 01/21/2020   Numbness and tingling in left arm 01/21/2020   Health maintenance examination 01/05/2017   Right knee pain 01/18/2016   Barrett's esophagus 12/08/2015   Abnormal tympanic membrane of right ear 07/05/2015   Advanced  care planning/counseling discussion 06/30/2014   Medicare annual wellness visit, subsequent 06/22/2013   Benign prostatic hyperplasia with urinary obstruction 06/18/2013   ED (erectile dysfunction) 04/10/2012   Hyperlipidemia associated with type 2 diabetes mellitus ([place]) 12/29/2008   LEG PAIN, BILATERAL 11/19/2008   Diabetes mellitus with mild  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 macular edema ([place]) 07/24/2007   ANXIETY 07/24/2007   GERD 07/24/2007    Conditions to be addressed/monitored:HTN, DMII, and memory impairment  Care Plan : [facility] plan of care  Updates made by Dannielle Karvonen, RN since 02/16/2022 12:00 AM     Problem: Chronic disese management education and / or care coordination needs.( Diabetes, Hypertension)   Priority: High     Long-Range Goal: Development of plan of care to address chronic disease management and / or care coordination needs ( DMII, Hypertension)   Expected End Date: 04/27/2022  Priority: High  Note:   Current Barriers:  Knowledge Deficits related to plan of care for management of HTN and DMII  Chronic Disease Management support and education needs related to HTN and DMII Cognitive Deficits Memory impairment Patient reports having a decrease in low blood sugars.  He reports today's fasting blood sugar was 230.  He reports his blood sugar has ranged from 250-350.  He states his blood sugar dropped to 80 in the middle of the night this past week. He reports treating with glucose tablets.  Patient states he is taking his ozempic once every 2 weeks.  He states it causes him not to have an appetite therefore he doesn't eat as much and his blood sugar drops.  RNCM Clinical Goal(s):  Patient will verbalize understanding of plan for management of HTN and DMII take all medications exactly as prescribed and will call provider for medication related questions attend all scheduled medical appointments:  continue to work with RN Care Manager to address care management and care coordination needs related to  HTN and DMII through collaboration with RN Care manager, provider, and care team.  patient will demonstrate improved adherence to prescribed treatment plan for diabetes self care/management as evidenced by: daily monitoring and recording of CBG  adherence to ADA/ carb modified diet adherence to prescribed  medication regimen contacting provider for new or worsened symptoms or questions:   Interventions: 1:1 collaboration with primary care provider regarding development and update of comprehensive plan of care as evidenced by provider attestation and co-signature Inter-disciplinary care team collaboration (see longitudinal plan of care) Evaluation of current treatment plan related to  self management and patient's adherence to plan as established by provider  Hypertension Interventions: Goal on track: Yes Last practice recorded BP readings:  BP Readings from Last 3 Encounters:  12/27/21 132/72  10/24/21 128/78  08/08/21 112/70  Most recent eGFR/CrCl: No results found for: "EGFR"  No components found for: "CRCL"  Evaluation of current treatment plan related to hypertension self management and patient's adherence to plan as established by provider; Reviewed medications with patient and discussed importance of compliance; Discussed plans with patient for ongoing care management follow up and provided patient with direct contact information for care management team; Reviewed scheduled/upcoming provider appointments  Advised to continue to monitor blood pressure at least 2-3 times per month Advised to follow a low salt diet.   Memory impairment interventions: Goal on track:  Yes. Long term Evaluation of current treatment plan related to  memory impairment , self-management and patient's adherence to plan as established by provider. Discussed plans with patient for ongoing care management  follow up and provided patient with direct contact information for care management team Reviewed medications with patient and discussed  Reviewed scheduled/upcoming provider appointments  Discussed with patient memory impairment self care strategies  Diabetes Interventions: Goal on tract: Yes,  Long term Assessed patient's understanding of A1c goal: <7% Evaluation of current treatment plan related to memory  impairment, self-management and patient's adherence to plan as established by provider. Discussed plans with patient for ongoing care management follow up and provided patient with direct contact information for care management team Advised patient to notify endocrinologist of  his ongoing/ frequent hypoglycemic events as soon as possible. Patients next appointment with endocrinologist is 01/24/22.  Advised to follow a low carbohydrate/ diabetic diet.  Reviewed signs/ symptoms of hyperglycemia/ hypoglycemia and treatment  Advised patient to notify his endocrinologist that he has decreased his Ozempic  Lab Results  Component Value Date   HGBA1C 7.0 (A) 12/27/2021     Patient Goals/Self-Care Activities: Continue to take medications as prescribed and refill timely Notify your endocrinologist of decrease in [place]  Follow up with providers as recommended.  Call provider office for new concerns or questions Continue to apply memory impairment strategies:  Socialize regularly, get a good nights sleep, eat a healthy diet, read,  and games such as ( sudoku, crossword puzzles) Check blood pressure 2-3 times per month Reminder:  Consider having family member accompany you to your appointments and assist with medication reminders Continue to check your blood sugar as directed by your provider, take your blood sugar readings to your provider appointment      Plan:The patient has been provided with contact information for the care management team and has been advised to call with any health related questions or concerns.  The care management team will reach out to the patient again over the next 45 days. Quinn Plowman RN,BSN,CCM RN Case Manager [place]  [phone]

## 2022-02-16 NOTE — Patient Instructions (Signed)
Visit Information  Thank you for taking time to visit with me today. Please don't hesitate to contact me if I can be of assistance to you before our next scheduled telephone appointment.  Following are the goals we discussed today:  Continue to take medications as prescribed and refill timely Notify your endocrinologist of decrease in [place]  Follow up with providers as recommended.  Call provider office for new concerns or questions Continue to apply memory impairment strategies:  Socialize regularly, get a good nights sleep, eat a healthy diet, read,  and games such as ( sudoku, crossword puzzles) Check blood pressure 2-3 times per month Reminder:  Consider having family member accompany you to your appointments and assist with medication reminders Continue to check your blood sugar as directed by your provider, take your blood sugar readings to your provider appointment  Our next appointment is by telephone on 03/24/22 at 1:00 pm  Please call the care guide team at [phone] if you need to cancel or reschedule your appointment.   If you are experiencing a Mental Health or [place] or need someone to talk to, please call the Suicide and Crisis Lifeline: 988 call 1-800-273-TALK (toll free, 24 hour hotline)   The patient verbalized understanding of instructions, educational materials, and care plan provided today and agreed to receive a mailed copy of patient instructions, educational materials, and care plan.   Quinn Plowman RN,BSN,CCM RN Case Manager [place]  [phone]

## 2022-02-21 ENCOUNTER — Telehealth: Payer: Self-pay

## 2022-02-23 ENCOUNTER — Telehealth: Payer: Self-pay

## 2022-02-23 NOTE — Telephone Encounter (Signed)
Received faxed pre-op clearance form from [facility].    Spoke with pt scheduling pre-op OV tomorrow at 3:00.

## 2022-02-23 NOTE — Telephone Encounter (Signed)
Opened in error

## 2022-02-24 ENCOUNTER — Ambulatory Visit (INDEPENDENT_AMBULATORY_CARE_PROVIDER_SITE_OTHER)
Admission: RE | Admit: 2022-02-24 | Discharge: 2022-02-24 | Disposition: A | Discharge status: Home / Self Care | Payer: Medicare HMO | Source: Ambulatory Visit | Attending: Family Medicine | Admitting: Family Medicine

## 2022-02-24 ENCOUNTER — Ambulatory Visit (INDEPENDENT_AMBULATORY_CARE_PROVIDER_SITE_OTHER): Payer: Medicare HMO | Admitting: Family Medicine

## 2022-02-24 ENCOUNTER — Encounter: Payer: Self-pay | Admitting: Family Medicine

## 2022-02-24 VITALS — BP 122/74 | HR 81 | Temp 97.9°F | Ht 72.0 in | Wt 220.0 lb

## 2022-02-24 DIAGNOSIS — I2510 Atherosclerotic heart disease of native coronary artery without angina pectoris: Secondary | ICD-10-CM | POA: Diagnosis not present

## 2022-02-24 DIAGNOSIS — M1712 Unilateral primary osteoarthritis, left knee: Secondary | ICD-10-CM | POA: Diagnosis not present

## 2022-02-24 DIAGNOSIS — J449 Chronic obstructive pulmonary disease, unspecified: Secondary | ICD-10-CM | POA: Diagnosis not present

## 2022-02-24 DIAGNOSIS — I10 Essential (primary) hypertension: Secondary | ICD-10-CM | POA: Diagnosis not present

## 2022-02-24 DIAGNOSIS — Z01818 Encounter for other preprocedural examination: Secondary | ICD-10-CM

## 2022-02-24 DIAGNOSIS — Z87891 Personal history of nicotine dependence: Secondary | ICD-10-CM | POA: Diagnosis not present

## 2022-02-24 DIAGNOSIS — E113291 Type 2 diabetes mellitus with mild nonproliferative diabetic retinopathy without macular edema, right eye: Secondary | ICD-10-CM | POA: Diagnosis not present

## 2022-02-24 DIAGNOSIS — E11649 Type 2 diabetes mellitus with hypoglycemia without coma: Secondary | ICD-10-CM

## 2022-02-24 DIAGNOSIS — Z794 Long term (current) use of insulin: Secondary | ICD-10-CM

## 2022-02-24 DIAGNOSIS — E1159 Type 2 diabetes mellitus with other circulatory complications: Secondary | ICD-10-CM | POA: Diagnosis not present

## 2022-02-24 NOTE — Progress Notes (Unsigned)
Patient ID: Anthony [name], male    DOB: 02/13/1946, 76 y.o.   MRN: [id number]  This visit was conducted in person.  BP 122/74 (BP Location: Left Arm, Patient Position: Sitting, Cuff Size: Large)   Pulse 81   Temp 97.9 F (36.6 C)   Ht 6' (1.829 m)   Wt 220 lb (99.8 kg)   SpO2 95%   BMI 29.84 kg/m    CC: preop eval Subjective:   HPI: Anthony [name] is a 76 y.o. male presenting on 02/24/2022 for Pre-Surgical Clearance (Supposed to have Left TKR. Thinks he may have to delay the surgery due to care for his wife while he is recovering.)   Anthony [name]  has a past medical history of Aortic atherosclerosis ([place]) (02/28/2020), Barrett's esophagus (12/08/2015), BPH with obstruction/lower urinary tract symptoms (2014), COPD (chronic obstructive pulmonary disease) ([place]) (02/28/2020), Coronary artery calcification seen on CAT scan (02/28/2020), Diabetes mellitus type 2 with complications ([place]), DKA (diabetic ketoacidoses) (01/2010), Ex-smoker (01/21/2020), GERD (gastroesophageal reflux disease), HTN (hypertension), Hyperlipidemia, Osteoarthritis, and Rheumatic fever.  Planned upcoming left total knee replacement under spinal anesthesia.   Worried about amount of time he will need for recovery as he is sole caregiver for wife with memory trouble.   Patient has tolerated anesthesia well in the past.  Latest surgical intervention was bilat hernia repair 2021.  Denies trouble with post-op nausea/vomiting, or trouble awakening after surgery.   Denies chest pain, dyspnea, palpitations, leg swelling, HA, dizziness.  Notes occasional exertional fatigue after walking from his garage.  DM - on novolog 10-18u TID with meals, tresiba 32u daily, metformin XR 555m daily followed by endocrinology Dr GCruzita Ledererlast seen 10/24/2021, at that time rec 3 mo f/u visit (now overdue). He is using DexCom G6 which has helped him avoid hypoglycemic episodes - [place] Ozempic 123mweekly stopped  last month due to anorexia.   CAC - sees cardiology Dr ChGasper Sellsast seen 07/2021, rec 1 yr f/u visit.   Ex smoker - quit ~2005     Relevant past medical, surgical, family and social history reviewed and updated as indicated. Interim medical history since our last visit reviewed. Allergies and medications reviewed and updated. Outpatient Medications Prior to Visit  Medication Sig Dispense Refill   amLODipine (NORVASC) 5 MG tablet TAKE ONE TABLET BY MOUTH ONCE DAILY 90 tablet 0   b complex vitamins tablet Take 1 tablet by mouth daily.     blood glucose meter kit and supplies KIT Dispense based on patient and insurance preference. Use up to four times daily as directed. 1 each 0   Continuous Blood Gluc Transmit (DEXCOM G6 TRANSMITTER) MISC 1 Device by Does not apply route every 3 (three) months. 1 each 3   Dextrose, Diabetic Use, (GLUCOSE PO) Take 1 tablet by mouth as needed.     glucose blood (ONETOUCH ULTRA) test strip USE AS DIRECTED FOUR TIMES DAILY 150 strip 3   GVOKE HYPOPEN 1-PACK 1 MG/0.2ML SOAJ Inject under skin 0.2 mL as needed for hypoglycemia 0.2 mL 11   insulin degludec (TRESIBA FLEXTOUCH) 200 UNIT/ML FlexTouch Pen INJECT 32U into SUBCUTANEOUSLY DAILY 27 mL 2   Insulin Pen Needle (ADVOCATE INSULIN PEN NEEDLES) 31G X 5 MM MISC Use 3x a day 300 each 3   metFORMIN (GLUCOPHAGE-XR) 500 MG 24 hr tablet Take 1 tablet (500 mg total) by mouth daily with breakfast. 360 tablet 0   NOVOLOG FLEXPEN 100 UNIT/ML FlexPen INJECT 10-18  UNITS into THE SKIN THREE TIMES DAILY with meals 60 mL 1   olmesartan-hydrochlorothiazide (BENICAR HCT) 40-25 MG tablet TAKE 1 TABLET BY MOUTH EVERY DAY 90 tablet 2   pantoprazole (PROTONIX) 40 MG tablet TAKE ONE TABLET BY MOUTH ONCE DAILY 90 tablet 0   simvastatin (ZOCOR) 20 MG tablet TAKE 1 TABLET BY MOUTH EVERYDAY AT BEDTIME 90 tablet 1   tadalafil (CIALIS) 20 MG tablet TAKE 1/2 TO 1 TABLET BY MOUTH every other DAY as needed FOR erectile dysfunction 5 tablet  0   OZEMPIC, 1 MG/DOSE, 4 MG/3ML SOPN Inject 1 mg into the skin once a week. (Patient not taking: Reported on 02/24/2022) 9 mL 3   No facility-administered medications prior to visit.     Per HPI unless specifically indicated in ROS section below Review of Systems  Objective:  BP 122/74 (BP Location: Left Arm, Patient Position: Sitting, Cuff Size: Large)   Pulse 81   Temp 97.9 F (36.6 C)   Ht 6' (1.829 m)   Wt 220 lb (99.8 kg)   SpO2 95%   BMI 29.84 kg/m   Wt Readings from Last 3 Encounters:  02/24/22 220 lb (99.8 kg)  01/17/22 217 lb (98.4 kg)  12/27/21 218 lb (98.9 kg)      Physical Exam Vitals and nursing note reviewed.  Constitutional:      Appearance: Normal appearance. He is not ill-appearing.  HENT:     Mouth/Throat:     Mouth: Mucous membranes are moist.     Pharynx: Oropharynx is clear. No oropharyngeal exudate or posterior oropharyngeal erythema.  Eyes:     Extraocular Movements: Extraocular movements intact.     Conjunctiva/sclera: Conjunctivae normal.     Pupils: Pupils are equal, round, and reactive to light.  Cardiovascular:     Rate and Rhythm: Normal rate and regular rhythm.     Pulses: Normal pulses.     Heart sounds: Normal heart sounds. No murmur heard. Pulmonary:     Effort: Pulmonary effort is normal. No respiratory distress.     Breath sounds: Normal breath sounds. No wheezing, rhonchi or rales.  Musculoskeletal:     Right lower leg: No edema.     Left lower leg: No edema.  Skin:    General: Skin is warm and dry.  Neurological:     Mental Status: He is alert.  Psychiatric:        Mood and Affect: Mood normal.        Behavior: Behavior normal.       Results for orders placed or performed in visit on 03/50/09  Basic metabolic panel  Result Value Ref Range   Glucose, Bld 118 (H) 65 - 99 mg/dL   BUN 22 7 - 25 mg/dL   Creat 1.07 0.70 - 1.28 mg/dL   BUN/Creatinine Ratio NOT APPLICABLE 6 - 22 (calc)   Sodium 141 135 - 146 mmol/L    Potassium 3.6 3.5 - 5.3 mmol/L   Chloride 104 98 - 110 mmol/L   CO2 26 20 - 32 mmol/L   Calcium 8.9 8.6 - 10.3 mg/dL  CBC with Differential/Platelet  Result Value Ref Range   WBC 7.0 3.8 - 10.8 Thousand/uL   RBC 4.53 4.20 - 5.80 Million/uL   Hemoglobin 13.7 13.2 - 17.1 g/dL   HCT 40.7 38.5 - 50.0 %   MCV 89.8 80.0 - 100.0 fL   MCH 30.2 27.0 - 33.0 pg   MCHC 33.7 32.0 - 36.0 g/dL   RDW 12.6  11.0 - 15.0 %   Platelets 230 140 - 400 Thousand/uL   MPV 10.0 7.5 - 12.5 fL   Neutro Abs 4,823 1,500 - 7,800 cells/uL   Lymphs Abs 1,414 850 - 3,900 cells/uL   Absolute Monocytes 511 200 - 950 cells/uL   Eosinophils Absolute 210 15 - 500 cells/uL   Basophils Absolute 42 0 - 200 cells/uL   Neutrophils Relative % 68.9 %   Total Lymphocyte 20.2 %   Monocytes Relative 7.3 %   Eosinophils Relative 3.0 %   Basophils Relative 0.6 %  Protime-INR  Result Value Ref Range   INR 1.0    Prothrombin Time 10.3 9.0 - 11.5 sec  eGFR = 68  DG Chest 2 View CLINICAL DATA:  Preop.  EXAM: CHEST - 2 VIEW  COMPARISON:  Chest x-ray June 18, 2013.  FINDINGS: The heart size and mediastinal contours are within normal limits. Tortuous aorta. Both lungs are clear. No visible pleural effusions or pneumothorax. No acute osseous abnormality.  IMPRESSION: No active cardiopulmonary disease.  Electronically Signed   By: Margaretha Sheffield M.D.   On: 02/24/2022 16:26  EKG - NSR rate 75, LAD, intervals, no hypertrophy or acute ST/T changes, poor R wave progression laterally, overall unchanged from prior 07/2020.   Assessment & Plan:   Problem List Items Addressed This Visit     Ex-smoker    Remains abstinent.       COPD (chronic obstructive pulmonary disease) ([name])    Asxs, no need for respiratory medication. Update CXR.       Coronary artery calcification seen on CAT scan    Update EKG.  Denies chest pain/pressure but notes exertional fatigue.  Will request cardiac clearance.       Type 2  diabetes mellitus with hypoglycemia ([name])    Hypoglycemic events have significantly diminished since starting to use Dexcom G6 continuous glucose monitor.       Primary osteoarthritis of left knee    Saw Dr Marlou Sa ortho who recommended knee replacement.       Pre-operative exam - Primary    RCRI = 1 (30d cardiovascular risk of 6%) Check CBC, BMP, INR today as well as CXR and EKG.  Will request cardiac and glycemic perioperative recommendations from cardiology and endocrinology respectively.  Pending above, anticipate adequately low risk to proceed with planned surgical intervention.       Relevant Orders   EKG 12-Lead (Completed)   DG Chest 2 View (Completed)   Basic metabolic panel (Completed)   CBC with Differential/Platelet (Completed)   Protime-INR (Completed)     No orders of the defined types were placed in this encounter.  Orders Placed This Encounter  Procedures   DG Chest 2 View    Standing Status:   Future    Number of Occurrences:   1    Standing Expiration Date:   02/25/2023    Order Specific Question:   Reason for Exam (SYMPTOM  OR DIAGNOSIS REQUIRED)    Answer:   preop eval    Order Specific Question:   Preferred imaging location?    Answer:   [facility]   Basic metabolic panel   CBC with Differential/Platelet   Protime-INR   EKG 12-Lead     Patient Instructions  Labs today. Chest xray today.  I recommend getting cardiology and Dr Cruzita Lederer approval prior to upcoming surgery.  I will send their offices a note.   Follow up plan: Return if symptoms worsen or fail  to improve.  [name], MD

## 2022-02-24 NOTE — Patient Instructions (Signed)
Labs today. Chest xray today.  I recommend getting cardiology and Dr Cruzita Lederer approval prior to upcoming surgery.  I will send their offices a note.

## 2022-02-25 ENCOUNTER — Telehealth: Payer: Self-pay | Admitting: Family Medicine

## 2022-02-25 ENCOUNTER — Encounter: Payer: Self-pay | Admitting: Family Medicine

## 2022-02-25 DIAGNOSIS — Z01818 Encounter for other preprocedural examination: Secondary | ICD-10-CM | POA: Insufficient documentation

## 2022-02-25 LAB — BASIC METABOLIC PANEL
BUN: 22 mg/dL (ref 7–25)
CO2: 26 mmol/L (ref 20–32)
Calcium: 8.9 mg/dL (ref 8.6–10.3)
Chloride: 104 mmol/L (ref 98–110)
Creat: 1.07 mg/dL (ref 0.70–1.28)
Glucose, Bld: 118 mg/dL — ABNORMAL HIGH (ref 65–99)
Potassium: 3.6 mmol/L (ref 3.5–5.3)
Sodium: 141 mmol/L (ref 135–146)

## 2022-02-25 LAB — CBC WITH DIFFERENTIAL/PLATELET
Absolute Monocytes: 511 cells/uL (ref 200–950)
Basophils Absolute: 42 cells/uL (ref 0–200)
Basophils Relative: 0.6 %
Eosinophils Absolute: 210 cells/uL (ref 15–500)
Eosinophils Relative: 3 %
HCT: 40.7 % (ref 38.5–50.0)
Hemoglobin: 13.7 g/dL (ref 13.2–17.1)
Lymphs Abs: 1414 cells/uL (ref 850–3900)
MCH: 30.2 pg (ref 27.0–33.0)
MCHC: 33.7 g/dL (ref 32.0–36.0)
MCV: 89.8 fL (ref 80.0–100.0)
MPV: 10 fL (ref 7.5–12.5)
Monocytes Relative: 7.3 %
Neutro Abs: 4823 cells/uL (ref 1500–7800)
Neutrophils Relative %: 68.9 %
Platelets: 230 10*3/uL (ref 140–400)
RBC: 4.53 10*6/uL (ref 4.20–5.80)
RDW: 12.6 % (ref 11.0–15.0)
Total Lymphocyte: 20.2 %
WBC: 7 10*3/uL (ref 3.8–10.8)

## 2022-02-25 LAB — PROTIME-INR
INR: 1
Prothrombin Time: 10.3 s (ref 9.0–11.5)

## 2022-02-25 NOTE — Telephone Encounter (Signed)
Patient has upcoming planned L knee arthroplasty under spinal anesthesia by Dr Marlou Sa ortho, date TBD.  I saw patient on 02/24/2022 for preop evaluation.  He last saw Dr Gasper Sells on 08/08/2021.  I will ask heart team to provider preoperative cardiac clearance.  Thank you in advance!

## 2022-02-25 NOTE — Assessment & Plan Note (Signed)
Remains abstinent 

## 2022-02-25 NOTE — Telephone Encounter (Signed)
Patient has upcoming planned L knee arthroplasty under spinal anesthesia by Dr Erlinda Hong [facility] ortho, date TBD.  I saw patient on 02/24/2022 for preop evaluation.  Diabetes is managed by endocrinology, overall doing well. I will forward to Dr Cruzita Lederer for perioperative glycemic recommendations.  Lab Results  Component Value Date   HGBA1C 7.0 (A) 12/27/2021    Thank you in advance!

## 2022-02-25 NOTE — Assessment & Plan Note (Signed)
Saw Dr Marlou Sa ortho who recommended knee replacement.

## 2022-02-25 NOTE — Assessment & Plan Note (Signed)
RCRI = 1 (30d cardiovascular risk of 6%) Check CBC, BMP, INR today as well as CXR and EKG.  Will request cardiac and glycemic perioperative recommendations from cardiology and endocrinology respectively.  Pending above, anticipate adequately low risk to proceed with planned surgical intervention.

## 2022-02-25 NOTE — Assessment & Plan Note (Addendum)
Update EKG.  Denies chest pain/pressure but notes exertional fatigue.  Will request cardiac clearance.

## 2022-02-25 NOTE — Assessment & Plan Note (Addendum)
Asxs, no need for respiratory medication. Update CXR.

## 2022-02-25 NOTE — Assessment & Plan Note (Signed)
Hypoglycemic events have significantly diminished since starting to use Dexcom G6 continuous glucose monitor.

## 2022-02-27 ENCOUNTER — Telehealth: Payer: Self-pay | Admitting: *Deleted

## 2022-02-27 NOTE — Telephone Encounter (Signed)
   Name: Anthony [name]  DOB: [birth year]  MRN: [id number]  Primary Cardiologist: Werner Lean, MD  Chart reviewed as part of pre-operative protocol coverage. Because of [name]'s past medical history and time since last visit, he will require a follow-up in-office visit in order to better assess preoperative cardiovascular risk.  Pre-op covering staff: - Please schedule appointment and call patient to inform them. If patient already had an upcoming appointment within acceptable timeframe, please add "pre-op clearance" to the appointment notes so provider is aware. - Please contact requesting surgeon's office via preferred method (i.e, phone, fax) to inform them of need for appointment prior to surgery.  Lenna Sciara, NP  02/27/2022, 10:28 AM

## 2022-02-27 NOTE — Telephone Encounter (Signed)
Xu patient pls send to him thx

## 2022-02-27 NOTE — Telephone Encounter (Signed)
Note    Pre-operative Risk Assessment    Patient Name: Anthony [name]  DOB: Oct 03, 1945 MRN: [id number]      CLEARANCE REQUEST INFORMATION WAS PROVIDED BY PCP DR. GUTIERREZ Request for Surgical Clearance     Procedure:   L knee arthroplasty    Date of Surgery:  Clearance TBD                               33  Surgeon:  DR. DEAN  Surgeon's Group or Practice Name:   Phone number:  [phone] Fax number:  [phone]   Type of Clearance Requested:   - Medical ; NO MEDICATIONS ARE LISTED AS NEEDING TO BE HELD   Type of Anesthesia:  Spinal   Additional requests/questions:     Anthony [name]   02/27/2022, 3:22 PM

## 2022-02-27 NOTE — Telephone Encounter (Signed)
Pt agreeable to plan of care for in office appt 03/06/22 with Cecilie Kicks, NP.

## 2022-02-27 NOTE — Telephone Encounter (Signed)
   Pre-operative Risk Assessment    Patient Name: Anthony [name]  DOB: 04/09/1946 MRN: [id number]     CLEARANCE REQUEST INFORMATION WAS PROVIDED BY PCP DR. GUTIERREZ Request for Surgical Clearance    Procedure:   L knee arthroplasty   Date of Surgery:  Clearance TBD                               33  Surgeon:  DR. DEAN  Surgeon's Group or Practice Name:   Phone number:  [phone] Fax number:  [phone]   Type of Clearance Requested:   - Medical ; NO MEDICATIONS ARE LISTED AS NEEDING TO BE HELD   Type of Anesthesia:  Spinal   Additional requests/questions:    [name]   02/27/2022, 3:22 PM

## 2022-03-01 NOTE — Telephone Encounter (Signed)
Anthony [name], This patient needs to be cleared for knee surgery, but I reviewed his Dexcom data for the last 2 weeks and his sugars and also his predicted HbA1c is higher, at 8.6%.  I will need to see him back (he does not have an appointment scheduled) -can we please schedule this? Thank you, C

## 2022-03-01 NOTE — Telephone Encounter (Signed)
CORRECTION: SURGEON IS DR. Xu.   I will forward notes to Dr. Erlinda Hong, pt has appt 03/06/22

## 2022-03-01 NOTE — Telephone Encounter (Signed)
Thanks

## 2022-03-02 ENCOUNTER — Ambulatory Visit: Payer: Medicare HMO | Admitting: Internal Medicine

## 2022-03-02 ENCOUNTER — Other Ambulatory Visit: Payer: Self-pay | Admitting: Family Medicine

## 2022-03-02 ENCOUNTER — Encounter: Payer: Self-pay | Admitting: Internal Medicine

## 2022-03-02 ENCOUNTER — Telehealth: Payer: Self-pay

## 2022-03-02 VITALS — BP 130/82 | HR 73 | Ht 72.0 in | Wt 218.2 lb

## 2022-03-02 DIAGNOSIS — E113299 Type 2 diabetes mellitus with mild nonproliferative diabetic retinopathy without macular edema, unspecified eye: Secondary | ICD-10-CM | POA: Diagnosis not present

## 2022-03-02 DIAGNOSIS — E785 Hyperlipidemia, unspecified: Secondary | ICD-10-CM | POA: Diagnosis not present

## 2022-03-02 DIAGNOSIS — Z794 Long term (current) use of insulin: Secondary | ICD-10-CM

## 2022-03-02 DIAGNOSIS — E663 Overweight: Secondary | ICD-10-CM

## 2022-03-02 LAB — POCT GLYCOSYLATED HEMOGLOBIN (HGB A1C): Hemoglobin A1C: 7.3 % — AB (ref 4.0–5.6)

## 2022-03-02 NOTE — Patient Instructions (Addendum)
Please continue: - Metformin ER 500 mg daily in am - Ozempic 1 mg weekly - Tresiba 32 units daily in the morning - Novolog 15 minutes before each meal: - 12 units before small meals - 15 units before regular meals - 18-20 units before larger meals  If the sugars are <90 before meals, take NovoLog at the start of the meal.  Call me in 2 weeks so I can look at the Dexcom tracings.  Please return in 3-4 months.
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
## 2022-03-02 NOTE — Progress Notes (Signed)
Chronic Care Management Pharmacy Assistant   Name: Anthony [name]  MRN: [id number] DOB: [birth year]  Reason for Encounter: CCM (Medication Adherence and Delivery Coordination)  Recent office visits:  02/24/22 Anthony Bush, MD Pre-Op Exam Ordered: Chest Xray No med changes  Recent consult visits:  03/02/22 Anthony Kingdom, MD (Endo) DM A1C 7.3 No med changes FU 3-4 months  Hospital visits:  None in previous 6 months  Medications: Outpatient Encounter Medications as of 03/02/2022  Medication Sig   amLODipine (NORVASC) 5 MG tablet TAKE ONE TABLET BY MOUTH ONCE DAILY   b complex vitamins tablet Take 1 tablet by mouth daily.   blood glucose meter kit and supplies KIT Dispense based on patient and insurance preference. Use up to four times daily as directed.   Continuous Blood Gluc Transmit (DEXCOM G6 TRANSMITTER) MISC 1 Device by Does not apply route every 3 (three) months.   Dextrose, Diabetic Use, (GLUCOSE PO) Take 1 tablet by mouth as needed.   glucose blood (ONETOUCH ULTRA) test strip USE AS DIRECTED FOUR TIMES DAILY   GVOKE HYPOPEN 1-PACK 1 MG/0.2ML SOAJ Inject under skin 0.2 mL as needed for hypoglycemia   insulin degludec (TRESIBA FLEXTOUCH) 200 UNIT/ML FlexTouch Pen INJECT 32U into SUBCUTANEOUSLY DAILY   Insulin Pen Needle (ADVOCATE INSULIN PEN NEEDLES) 31G X 5 MM MISC Use 3x a day   metFORMIN (GLUCOPHAGE-XR) 500 MG 24 hr tablet Take 1 tablet (500 mg total) by mouth daily with breakfast.   NOVOLOG FLEXPEN 100 UNIT/ML FlexPen INJECT 10-18 UNITS into THE SKIN THREE TIMES DAILY with meals   olmesartan-hydrochlorothiazide (BENICAR HCT) 40-25 MG tablet TAKE 1 TABLET BY MOUTH EVERY DAY   OZEMPIC, 1 MG/DOSE, 4 MG/3ML SOPN Inject 1 mg into the skin once a week. (Patient not taking: Reported on 02/24/2022)   pantoprazole (PROTONIX) 40 MG tablet TAKE ONE TABLET BY MOUTH ONCE DAILY   simvastatin (ZOCOR) 20 MG tablet TAKE 1 TABLET BY MOUTH EVERYDAY AT BEDTIME   tadalafil (CIALIS) 20  MG tablet TAKE 1/2 TO 1 TABLET BY MOUTH every other DAY as needed FOR erectile dysfunction   No facility-administered encounter medications on file as of 03/02/2022.   BP Readings from Last 3 Encounters:  03/02/22 130/82  02/24/22 122/74  12/27/21 132/72    Lab Results  Component Value Date   HGBA1C 7.3 (A) 03/02/2022    Recent OV, Consult or Hospital visit:  No medication changes indicated  Last adherence delivery date: 02/14/22      Patient is due for next adherence delivery on:  03/15/22  Spoke with patient on 03/06/2022 reviewed medications and coordinated delivery.  VIAL medications: Simvastatin 20 mg Take 1 tablet every day at bedtime OneTouch Test strips - Use up to four times daily Tadalafil 20 mg - 1/2 to 1 tablet daily as needed- PRN use Olmesartan/HCTZ 40-25 mg Take 1 tablet by mouth every day Amlodipine 5 mg 1 tablet daily Pantoprazole   40 mg 1 tablet daily Novolog Flexpen Inject 10-18 units into the skin 3 times daily with meals Tresiba Flex 200 units- 32 units once daily in morning  Gvoke Hypopen - 1 mg - Inject 0.2 mL as needed   Patient declined the following medications this month: Metformin 500 mg ER - take 1 tablet every morning (pt has excess supply as RX was for 3 a day now takes 1) Ozempic 1 mg/dose - Inject once weekly - has 2 boxes on hand  Any concerns about your medications? No  How often do you forget or accidentally miss a dose? Never   Do you use a pillbox? No   Is patient in packaging No  Refills requested from providers include: Tadalafil 20 mg - 1/2 to 1 tablet daily as needed- PRN use  Confirmed delivery date of 03/15/2022, advised patient that pharmacy will contact them the morning of delivery.   Recent blood pressure readings are as follows: Patient does not take blood pressure at home.    Recent blood glucose readings are as follows: Patient did not have any readings during the call. Patient is sending his readings from his  Dexcom to Dr. Cruzita [name] on 07/21 for clearance for knee surgery.   Annual wellness visit in last year? Yes 07/26/2021 Most Recent BP reading: 130/82 on 03/02/2022  If Diabetic: Most recent A1C reading: 7.3 on 03/02/2022 Last eye exam / retinopathy screening: Up to date Last diabetic foot exam: Up to date  Cycle dispensing form sent to [facility], CPP for review.

## 2022-03-06 ENCOUNTER — Ambulatory Visit: Payer: Medicare HMO | Admitting: Cardiology

## 2022-03-06 ENCOUNTER — Encounter: Payer: Self-pay | Admitting: Cardiology

## 2022-03-06 VITALS — BP 138/70 | HR 72 | Ht 72.0 in | Wt 220.8 lb

## 2022-03-06 DIAGNOSIS — E1169 Type 2 diabetes mellitus with other specified complication: Secondary | ICD-10-CM

## 2022-03-06 DIAGNOSIS — I700 Atherosclerosis of aorta: Secondary | ICD-10-CM | POA: Diagnosis not present

## 2022-03-06 DIAGNOSIS — I2510 Atherosclerotic heart disease of native coronary artery without angina pectoris: Secondary | ICD-10-CM | POA: Diagnosis not present

## 2022-03-06 DIAGNOSIS — E785 Hyperlipidemia, unspecified: Secondary | ICD-10-CM | POA: Diagnosis not present

## 2022-03-06 DIAGNOSIS — Z01818 Encounter for other preprocedural examination: Secondary | ICD-10-CM | POA: Diagnosis not present

## 2022-03-06 NOTE — Progress Notes (Signed)
Cardiology Office Note   Date:  03/06/2022   ID:  Anthony [name], DOB December 19, 1945, MRN [id number]  PCP:  [name], MD  Cardiologist:  Dr. Gasper Sells    Chief Complaint  Patient presents with   Pre-op Exam      History of Present Illness: Anthony [name] is a 76 y.o. male who presents for pre-op eval for Lt knee arthroplasty with Dr. Marlou Sa.    Pt with a hx of CAC, Aortic Atherosclerosis, DM with HTN, HLD, Erectile Dysfunction, COPD former smoker who presented for evaluation 08/31/20.  In interim of this visit, patient had uncomplicated cataract surgery.  We had talked about possible stress testing at last visits.  Last seen 08/08/21 by Dr. Gasper Sells.   Today pt is feeling well. No chest pain or SOB.  Only time he feels fatigue is after walking to his classic cars and then back to his home, but he does not need to stop, just rest at home.  He is as active today as with last visit with Dr. Gasper Sells.  He still cares for his wife.  His diabetes has not been well controlled, but that is improving as well.   He eats healthy now. BP stable.   Past Medical History:  Diagnosis Date   Aortic atherosclerosis ([place]) 02/28/2020   By CT   Barrett's esophagus 12/08/2015   By EGD 2010 [name])    BPH with obstruction/lower urinary tract symptoms 2014   s/p TURP   COPD (chronic obstructive pulmonary disease) ([place]) 02/28/2020   By CT: diffuse bronchial wall thickening with mild centrilobular and paraseptal emphysema (01/2020)   Coronary artery calcification seen on CAT scan 02/28/2020   3v CAD - calcified atherosclerotic plaque in the left main, left anterior descending and left circumflex coronary arteries (01/2020)   Diabetes mellitus type 2 with complications ([place])    completed DMSE   DKA (diabetic ketoacidoses) 01/2010   "in coma"   Ex-smoker 01/21/2020   GERD (gastroesophageal reflux disease)    HTN (hypertension)    Hyperlipidemia    controlled with medicine    Osteoarthritis    Rheumatic fever    maybe as child    Past Surgical History:  Procedure Laterality Date   ABI  12/2013   WNL   APPENDECTOMY     CATARACT EXTRACTION W/PHACO Right 12/01/2020   Procedure: CATARACT EXTRACTION PHACO AND INTRAOCULAR LENS PLACEMENT ([place])  RIGHT VIVITY TORIC LENS DIABETIC;  Surgeon: Leandrew Koyanagi, MD;  Location: [facility];  Service: Ophthalmology;  Laterality: Right;  6.93 1:12.8 9.5%   CATARACT EXTRACTION W/PHACO Left 12/15/2020   Procedure: CATARACT EXTRACTION PHACO AND INTRAOCULAR LENS PLACEMENT ([place])  VIVITY TORIC LENS DIABETIC LEFT EYE;  Surgeon: Leandrew Koyanagi, MD;  Location: [facility];  Service: Ophthalmology;  Laterality: Left;  3.47 1:03.8 5.5%   CHOLECYSTECTOMY     years ago   COLONOSCOPY  01/2009   TA, rec rpt 5 yrs [name])   COLONOSCOPY  01/2018   TAx2, diverticulosis, int hemorrhoids, rpt 5 yrs ([name])   ESOPHAGOGASTRODUODENOSCOPY  01/2009   biopsy with Barrett's   ESOPHAGOGASTRODUODENOSCOPY  01/2018   WNL - no barrett's ([name])   INGUINAL HERNIA REPAIR Bilateral 03/04/2020   Procedure: LAPAROSCOPIC BILATERAL INGUINAL HERNIA REPAIR WITH MESH;  Surgeon: [name], MD;  Location: [facility];  Service: General;  Laterality: Bilateral;   LAPAROSCOPIC APPENDECTOMY  07/05/2011   Procedure: APPENDECTOMY LAPAROSCOPIC;  Surgeon: [name], MD;  Location: [facility];  Service: General;  Laterality: N/A;   MENISCUS REPAIR Right 2018   [name] @ [facility]   TRANSURETHRAL RESECTION OF PROSTATE N/A 07/28/2013   Procedure: TRANSURETHRAL RESECTION OF THE PROSTATE WITH GYRUS INSTRUMENTS;  Surgeon: [name], MD   VASECTOMY     WISDOM TOOTH EXTRACTION       Current Outpatient Medications  Medication Sig Dispense Refill   amLODipine (NORVASC) 5 MG tablet TAKE ONE TABLET BY MOUTH ONCE DAILY 90 tablet 0   b complex vitamins tablet Take 1 tablet by mouth daily.     blood glucose meter kit and supplies KIT  Dispense based on patient and insurance preference. Use up to four times daily as directed. 1 each 0   Continuous Blood Gluc Transmit (DEXCOM G6 TRANSMITTER) MISC 1 Device by Does not apply route every 3 (three) months. 1 each 3   Dextrose, Diabetic Use, (GLUCOSE PO) Take 1 tablet by mouth as needed.     glucose blood (ONETOUCH ULTRA) test strip USE AS DIRECTED FOUR TIMES DAILY 150 strip 3   GVOKE HYPOPEN 1-PACK 1 MG/0.2ML SOAJ Inject under skin 0.2 mL as needed for hypoglycemia 0.2 mL 11   insulin degludec (TRESIBA FLEXTOUCH) 200 UNIT/ML FlexTouch Pen INJECT 32U into SUBCUTANEOUSLY DAILY 27 mL 2   Insulin Pen Needle (ADVOCATE INSULIN PEN NEEDLES) 31G X 5 MM MISC Use 3x a day 300 each 3   metFORMIN (GLUCOPHAGE-XR) 500 MG 24 hr tablet Take 1 tablet (500 mg total) by mouth daily with breakfast. 360 tablet 0   NOVOLOG FLEXPEN 100 UNIT/ML FlexPen INJECT 10-18 UNITS into THE SKIN THREE TIMES DAILY with meals 60 mL 1   olmesartan-hydrochlorothiazide (BENICAR HCT) 40-25 MG tablet TAKE 1 TABLET BY MOUTH EVERY DAY 90 tablet 2   OZEMPIC, 1 MG/DOSE, 4 MG/3ML SOPN Inject 1 mg into the skin once a week. 9 mL 3   pantoprazole (PROTONIX) 40 MG tablet TAKE ONE TABLET BY MOUTH ONCE DAILY 90 tablet 0   simvastatin (ZOCOR) 20 MG tablet TAKE 1 TABLET BY MOUTH EVERYDAY AT BEDTIME 90 tablet 1   tadalafil (CIALIS) 20 MG tablet TAKE 1/2 TO 1 TABLET BY MOUTH every other DAY AS NEEDED FOR erectile dysfunction Needs annual physical appt for further refills 5 tablet 0   No current facility-administered medications for this visit.    Allergies:   Patient has no known allergies.    Social History:  The patient  reports that he quit smoking about 10 years ago. His smoking use included cigarettes. He has a 15.00 pack-year smoking history. He has never used smokeless tobacco. He reports current alcohol use of about 7.0 standard drinks of alcohol per week. He reports that he does not use drugs.   Family History:  The patient's  family history includes Alcohol abuse in his father; Cancer in his father; Coronary artery disease in his mother; Diabetes in his brother, brother, brother and another family member; Heart disease in his mother; Lymphoma in his mother; Pancreatic cancer in his maternal uncle.    ROS:  General:no colds or fevers, no weight changes Skin:no rashes or ulcers HEENT:no blurred vision, no congestion CV:see HPI PUL:see HPI GI:no diarrhea constipation or melena, no indigestion GU:no hematuria, no dysuria MS:+ knee pain, no claudication Neuro:no syncope, no lightheadedness Endo:+ diabetes- improved control has not had to call EMS for hypoglycemia, no thyroid disease  Wt Readings from Last 3 Encounters:  03/06/22 220 lb 12.8 oz (100.2 kg)  03/02/22 218 lb 3.2 oz (99 kg)  02/24/22 220 lb (99.8 kg)     PHYSICAL EXAM: VS:  BP 138/70   Pulse 72   Ht 6' (1.829 m)   Wt 220 lb 12.8 oz (100.2 kg)   SpO2 93%   BMI 29.95 kg/m  , BMI Body mass index is 29.95 kg/m. General:Pleasant affect, NAD Skin:Warm and dry, brisk capillary refill HEENT:normocephalic, sclera clear, mucus membranes moist Neck:supple, no JVD, no bruits  Heart:S1S2 RRR without murmur, gallup, rub or click Lungs:clear without rales, rhonchi, or wheezes VGJ:FTNB, non tender, + BS, do not palpate liver spleen or masses Ext:no lower ext edema, 2+ pedal pulses, 2+ radial pulses Neuro:alert and oriented, MAE, follows commands, + facial symmetry    EKG:  EKG is ordered today. The ekg ordered today demonstrates  SR at 73, LAFB and no acute changes from prior EKGs I personally reviewed.    Recent Labs: 03/08/2021: TSH 2.83 08/08/2021: ALT 14 02/24/2022: BUN 22; Creat 1.07; Hemoglobin 13.7; Platelets 230; Potassium 3.6; Sodium 141    Lipid Panel    Component Value Date/Time   CHOL 127 08/08/2021 1554   TRIG 172 (H) 08/08/2021 1554   HDL 43 08/08/2021 1554   CHOLHDL 3.0 08/08/2021 1554   CHOLHDL 4 03/08/2021 1508   VLDL 24.0  03/08/2021 1508   LDLCALC 55 08/08/2021 1554   LDLDIRECT 94.0 10/05/2016 1728   Labs from other office 02/24/22   INR 1.0  WBC 7  Hgb 13.7 plts 230. BUN 22 Cr 1.07 K+ 3.6    Other studies Reviewed: Additional studies/ records that were reviewed today include: . NonCardiac CT: Date: 02/25/2020 Results:Distal LAD and D1 calcium, aortic arch atherosclerosis    ASSESSMENT AND PLAN:  1.  Pre-op eval for knee surgery - low risk procedure and pt is 0.9% risk of major cardiac event.  Acceptable risk. No angina and meets 4 METs of exercise.   No need for further testing.   2. Coronary calcification seen on CAT scan, no angina.  Aortic atherosclerosis  3.  COPD stable.  4.  DM-2 per PCP  5.  HLD stable on statin, check lipids in Dec.   Follow up with Dr. Gasper Sells in  6 months.   Current medicines are reviewed with the patient today.  The patient Has no concerns regarding medicines.  The following changes have been made:  See above Labs/ tests ordered today include:see above  Disposition:   FU:  see above  Signed, Cecilie Kicks, NP  03/06/2022 4:57 PM    [facility] [place], [place], [address] [place], [place] Phone: [phone]; Fax: [phone]

## 2022-03-06 NOTE — Patient Instructions (Signed)
Medication Instructions:   Your physician recommends that you continue on your current medications as directed. Please refer to the Current Medication list given to you today.   *If you need a refill on your cardiac medications before your next appointment, please call your pharmacy*   Lab Work:  None ordered.  If you have labs (blood work) drawn today and your tests are completely normal, you will receive your results only by: [place] (if you have MyChart) OR A paper copy in the mail If you have any lab test that is abnormal or we need to change your treatment, we will call you to review the results.   Testing/Procedures:  None ordered.   Follow-Up: At [facility], you and your health needs are our priority.  As part of our continuing mission to provide you with exceptional heart care, we have created designated Provider Care Teams.  These Care Teams include your primary Cardiologist (physician) and Advanced Practice Providers (APPs -  Physician Assistants and Nurse Practitioners) who all work together to provide you with the care you need, when you need it.  We recommend signing up for the patient portal called "MyChart".  Sign up information is provided on this After Visit Summary.  MyChart is used to connect with patients for Virtual Visits (Telemedicine).  Patients are able to view lab/test results, encounter notes, upcoming appointments, etc.  Non-urgent messages can be sent to your provider as well.   To learn more about what you can do with MyChart, go to [email or web address].    Your next appointment:   6 month(s)  The format for your next appointment:   In Person  Provider:   Werner Lean, MD     Other Instructions  Your physician wants you to follow-up in: 6 month with Dr. Gasper Sells. You will receive a reminder letter in the mail two months in advance. If you don't receive a letter, please call our office to schedule the follow-up  appointment.   Important Information About Sugar

## 2022-03-07 ENCOUNTER — Telehealth: Payer: Self-pay | Admitting: Family Medicine

## 2022-03-07 NOTE — Telephone Encounter (Signed)
Left message for patient to call back and schedule Medicare Annual Wellness Visit (AWV) either virtually or phone   Last AWV  03/08/21   I left my direct # [phone]

## 2022-03-09 ENCOUNTER — Telehealth: Payer: Medicare HMO

## 2022-03-10 ENCOUNTER — Telehealth: Payer: Self-pay

## 2022-03-10 NOTE — Progress Notes (Signed)
Chronic Care Management Pharmacy Assistant   Name: Anthony [name]  MRN: [id number] DOB: 1946-06-25  Reason for Encounter: CCM (Appointment Reminder)  Recent office visits:  02/24/22 [name], MD Pre-Op Exam Ordered: Chest Xray No med changes 12/27/21 [name], MD Diabetes: A1c 7.0 Stop (patient stopped due to anorexia) Ozempic. Patient is to contact Endo for discussing instead of stopping meds.  03/08/21 Danise Mina, MD, PCP - Pt presented for AWV. Follow up 3-4 months for formal memory assessment.  Recent consult visits:  03/02/22 [name], MD (Endo) DM A1C 7.3 No med changes FU 3-4 months 01/17/22 Frankey Shown, MD (Orthopedic Surgery) Osteoarthritis of left knee Ordered: XR Knee No med changes 12/16/21 Acquanetta Sit, DPM (Podiatry): Nail problem. Procedure: Toenails 1-5 b/l were debrided  10/24/2021 - [name], MD - Internal Medicine - Patient presented for Type 2 Diabetes. Abnormal Lab "Address by Endocrinology" A1c 6.8. Change: If the sugars are <90 before meals, take NovoLog at the start of the meal. 10/17/2021 - [place] - Patient presented for diabetic peripheral neuropathy associated with type 2 diabetes mellitus. Patient was delivered shoes and insoles 08/08/21 - Cardiology - Pt presented for follow up. LDL improved with LDL 55 (this was not fasting - goal < 55). Continue current medications. 07/26/21 - [name], Endocrinology - Patient presented for follow up diabetes, complicated by dementia, on basal-bolus insulin regimen and also metformin and weekly GLP-1 receptor agonist. Pt is unclear about his Tresiba dose (22 versus 32 units daily, skips if BG 110 at night). Using Dexcom. I did advise him at this visit to move [name] in the morning since in that case, he will most likely end up taking it as sugars are high at that time.  I also again advised him to set an alarm daily to take [name].  I also advised him to use  32 units.  Regarding NovoLog, I again strongly advised him to always take it 15 minutes before the meal unless the sugars are down to the 90s, in which case, he needs to take it at the start of the meal. A1c 7%, appears to be inaccurate.  Hospital visits:  None in previous 6 months  Medications: Outpatient Encounter Medications as of 03/10/2022  Medication Sig   amLODipine (NORVASC) 5 MG tablet TAKE ONE TABLET BY MOUTH ONCE DAILY   b complex vitamins tablet Take 1 tablet by mouth daily.   blood glucose meter kit and supplies KIT Dispense based on patient and insurance preference. Use up to four times daily as directed.   Continuous Blood Gluc Transmit (DEXCOM G6 TRANSMITTER) MISC 1 Device by Does not apply route every 3 (three) months.   Dextrose, Diabetic Use, (GLUCOSE PO) Take 1 tablet by mouth as needed.   glucose blood (ONETOUCH ULTRA) test strip USE AS DIRECTED FOUR TIMES DAILY   GVOKE HYPOPEN 1-PACK 1 MG/0.2ML SOAJ Inject under skin 0.2 mL as needed for hypoglycemia   insulin degludec (TRESIBA FLEXTOUCH) 200 UNIT/ML FlexTouch Pen INJECT 32U into SUBCUTANEOUSLY DAILY   Insulin Pen Needle (ADVOCATE INSULIN PEN NEEDLES) 31G X 5 MM MISC Use 3x a day   metFORMIN (GLUCOPHAGE-XR) 500 MG 24 hr tablet Take 1 tablet (500 mg total) by mouth daily with breakfast.   NOVOLOG FLEXPEN 100 UNIT/ML FlexPen INJECT 10-18 UNITS into THE SKIN THREE TIMES DAILY with meals   olmesartan-hydrochlorothiazide (BENICAR HCT) 40-25 MG tablet TAKE 1 TABLET BY MOUTH EVERY DAY   OZEMPIC, 1 MG/DOSE, 4  MG/3ML SOPN Inject 1 mg into the skin once a week.   pantoprazole (PROTONIX) 40 MG tablet TAKE ONE TABLET BY MOUTH ONCE DAILY   simvastatin (ZOCOR) 20 MG tablet TAKE 1 TABLET BY MOUTH EVERYDAY AT BEDTIME   tadalafil (CIALIS) 20 MG tablet TAKE 1/2 TO 1 TABLET BY MOUTH every other DAY AS NEEDED FOR erectile dysfunction Needs annual physical appt for further refills   No facility-administered encounter medications on file as  of 03/10/2022.   [name] was contacted to remind of upcoming telephone visit with Charlene Brooke  on 03/15/2022 at 10:15. Patient was reminded to have any blood glucose and blood pressure readings available for review at appointment.   Patient confirmed appointment.  Are you having any problems with your medications? No   Do you have any concerns you like to discuss with the pharmacist? No  CCM referral has been placed prior to visit?  Yes   Star Rating Drugs: Medication:   Last Fill: Day Supply Metformin 500 mg  03/13/22 30   Olmesartan-HCTZ 40-25 mg 03/13/22 30 Simvastatin 20 mg  03/13/22 30 Vials with [facility]  Charlene Brooke, CPP notified  [name], [place] Clinical Pharmacy Assistant [phone]

## 2022-03-15 ENCOUNTER — Telehealth: Payer: Medicare HMO

## 2022-03-15 NOTE — Progress Notes (Deleted)
Chronic Care Management Pharmacy Note  03/15/2022 Name:  Anthony [name] MRN:  [id number] DOB:  1945-11-26  Summary: CCM F/U visit ***  Recommendations/Changes made from today's visit: ***  Plan: -[facility] will call patient *** -Pharmacist follow up televisit scheduled for ***    Subjective: Anthony [name] is an 76 y.o. year old male who is a primary patient of [name], MD.  The CCM team was consulted for assistance with disease management and care coordination needs.    Engaged with patient by telephone for follow up visit in response to provider referral for pharmacy case management and/or care coordination services.   Consent to Services:  The patient was given information about Chronic Care Management services, agreed to services, and gave verbal consent prior to initiation of services.  Please see initial visit note for detailed documentation.   Patient Care Team: [name], MD as PCP - General (Family Medicine) Werner Lean, MD as PCP - Cardiology (Cardiology) [name], MD (Neurosurgery) [name], MD as Consulting Physician (General Surgery) [name], MD (Inactive) as Consulting Physician (Urology) Thelma Comp, [place] as Consulting Physician (Optometry) Debbora Dus, [facility] as Pharmacist (Pharmacist) Dannielle Karvonen, RN as [place] Management  Recent office visits: 02/24/22 [name], MD: Pre-Op Exam Ordered: Chest Xray No med changes  12/27/21 [name], MD Diabetes: A1c 7.0; pt stopped Ozempic on his own, advised to discuss with Endo about possible dose reduction rather than stopping.  03/08/21 Danise Mina, MD, PCP - Pt presented for AWV. Follow up 3-4 months for formal memory assessment.  Recent consult visits: 03/02/22 [name], MD (Endo) DM A1C 7.3, however GMI 8.6 and 65% of BG above 180 per Dexcom. Advised to take Novolog 15 min before meals to avoid low  sugars between meals. Cannot be cleared for surgery. Call back in 2 weeks for [facility] download.  01/17/22 Frankey Shown, MD (Orthopedic Surgery) Osteoarthritis of left knee Ordered: XR Knee No med changes 12/16/21 Acquanetta Sit, DPM (Podiatry) 10/24/2021 - [name], MD (Endocrine): take Novolog 15 min before meals. 10/17/2021 - [place] - DM shoes delivered 08/08/21 Dr Gasper Sells (Cardiology) - f/u -  LDL improved with LDL 55 (this was not fasting - goal < 55). Continue current medications.  Hospital visits: {Hospital DC Yes/No:25215}   Objective:  Lab Results  Component Value Date   CREATININE 1.07 02/24/2022   BUN 22 02/24/2022   GFR 68.45 03/08/2021   GFRNONAA >60 03/02/2020   GFRAA >60 03/02/2020   NA 141 02/24/2022   K 3.6 02/24/2022   CALCIUM 8.9 02/24/2022   CO2 26 02/24/2022   GLUCOSE 118 (H) 02/24/2022    Lab Results  Component Value Date/Time   HGBA1C 7.3 (A) 03/02/2022 01:17 PM   HGBA1C 7.0 (A) 12/27/2021 12:49 PM   HGBA1C 7.7 (H) 03/02/2020 10:26 AM   HGBA1C 7.8 (H) 01/12/2020 10:21 AM   GFR 68.45 03/08/2021 03:08 PM   GFR 81.99 01/12/2020 10:21 AM   MICROALBUR 6.3 (H) 06/28/2015 09:39 AM   MICROALBUR 4.4 (H) 06/26/2014 09:05 AM    Last diabetic Eye exam:  Lab Results  Component Value Date/Time   HMDIABEYEEXA No Retinopathy 09/23/2021 12:00 AM   HMDIABEYEEXA No Retinopathy 09/23/2021 12:00 AM    Last diabetic Foot exam:  Lab Results  Component Value Date/Time   HMDIABFOOTEX done by podiatry - in chart 05/19/2019 12:00 AM     Lab Results  Component Value Date  CHOL 127 08/08/2021   HDL 43 08/08/2021   LDLCALC 55 08/08/2021   LDLDIRECT 94.0 10/05/2016   TRIG 172 (H) 08/08/2021   CHOLHDL 3.0 08/08/2021       Latest Ref Rng & Units 08/08/2021    3:54 PM 03/08/2021    3:08 PM 01/12/2020   10:21 AM  Hepatic Function  Total Protein 6.0 - 8.3 g/dL  6.7  6.0   Albumin 3.5 - 5.2 g/dL  4.5  4.1   AST 0 - 37 U/L  12  15    ALT 0 - 44 IU/L 14  13  13    Alk Phosphatase 39 - 117 U/L  99  93   Total Bilirubin 0.2 - 1.2 mg/dL  1.0  0.8     Lab Results  Component Value Date/Time   TSH 2.83 03/08/2021 03:08 PM   TSH 3.11 06/26/2014 09:05 AM   FREET4 0.88 06/26/2014 09:05 AM       Latest Ref Rng & Units 02/24/2022    4:09 PM 03/02/2020   10:26 AM 01/05/2017   12:28 PM  CBC  WBC 3.8 - 10.8 Thousand/uL 7.0  8.5  5.7   Hemoglobin 13.2 - 17.1 g/dL 13.7  14.5  14.2   Hematocrit 38.5 - 50.0 % 40.7  42.8  41.3   Platelets 140 - 400 Thousand/uL 230  227  209.0     No results found for: "VD25OH"  Clinical ASCVD: Yes  The ASCVD Risk score (Arnett DK, et al., 2019) failed to calculate for the following reasons:   The valid total cholesterol range is 130 to 320 mg/dL       02/16/2021    9:27 AM 02/15/2021    1:09 PM 01/12/2020   12:05 PM  Depression screen PHQ 2/9  Decreased Interest 0 0 0  Down, Depressed, Hopeless 0 0 0  PHQ - 2 Score 0 0 0  Altered sleeping   0  Tired, decreased energy   0  Change in appetite   0  Feeling bad or failure about yourself    0  Trouble concentrating   0  Moving slowly or fidgety/restless   0  Suicidal thoughts   0  PHQ-9 Score   0  Difficult doing work/chores   Not difficult at all    Social History   Tobacco Use  Smoking Status Former   Packs/day: 0.30   Years: 50.00   Total pack years: 15.00   Types: Cigarettes   Quit date: 08/29/2011   Years since quitting: 10.5  Smokeless Tobacco Never   BP Readings from Last 3 Encounters:  03/06/22 138/70  03/02/22 130/82  02/24/22 122/74   Pulse Readings from Last 3 Encounters:  03/06/22 72  03/02/22 73  02/24/22 81   Wt Readings from Last 3 Encounters:  03/06/22 220 lb 12.8 oz (100.2 kg)  03/02/22 218 lb 3.2 oz (99 kg)  02/24/22 220 lb (99.8 kg)   BMI Readings from Last 3 Encounters:  03/06/22 29.95 kg/m  03/02/22 29.59 kg/m  02/24/22 29.84 kg/m    Assessment/Interventions: Review of patient past medical  history, allergies, medications, health status, including review of consultants reports, laboratory and other test data, was performed as part of comprehensive evaluation and provision of chronic care management services.   SDOH:  (Social Determinants of Health) assessments and interventions performed: {yes/no:20286}  SDOH Screenings   Alcohol Screen: Low Risk  (01/12/2020)   Alcohol Screen    Last Alcohol Screening Score (AUDIT):  4  Depression (PHQ2-9): Low Risk  (02/16/2021)   Depression (PHQ2-9)    PHQ-2 Score: 0  Financial Resource Strain: Medium Risk (01/20/2021)   Overall Financial Resource Strain (CARDIA)    Difficulty of Paying Living Expenses: Somewhat hard  Food Insecurity: No Food Insecurity (02/16/2021)   Hunger Vital Sign    Worried About Running Out of Food in the Last Year: Never true    Ran Out of Food in the Last Year: Never true  Housing: Low Risk  (02/16/2021)   Housing    Last Housing Risk Score: 0  Physical Activity: Inactive (01/12/2020)   Exercise Vital Sign    Days of Exercise per Week: 0 days    Minutes of Exercise per Session: 0 min  Social Connections: Not on file  Stress: No Stress Concern Present (01/12/2020)   [place]    Feeling of Stress : Not at all  Tobacco Use: Medium Risk (03/06/2022)   Patient History    Smoking Tobacco Use: Former    Smokeless Tobacco Use: Never    Passive Exposure: Not on file  Transportation Needs: No Transportation Needs (02/16/2021)   PRAPARE - Transportation    Lack of Transportation (Medical): No    Lack of Transportation (Non-Medical): No    CCM Care Plan  No Known Allergies  Medications Reviewed Today     Reviewed by Isaiah Serge, NP (Nurse Practitioner) on 03/06/22 at 1657  Med List Status: <None>   Medication Order Taking? Sig Documenting Provider Last Dose Status Informant  amLODipine (NORVASC) 5 MG tablet [id number] Yes TAKE ONE TABLET BY  MOUTH ONCE DAILY [name], MD Taking Active   b complex vitamins tablet [id number] Yes Take 1 tablet by mouth daily. [provider] Taking Active Self  blood glucose meter kit and supplies KIT [id number] Yes Dispense based on patient and insurance preference. Use up to four times daily as directed. [name], MD Taking Active   Continuous Blood Gluc Transmit (DEXCOM G6 TRANSMITTER) MISC [id number] Yes 1 Device by Does not apply route every 3 (three) months. [name], MD Taking Active   Dextrose, Diabetic Use, (GLUCOSE PO) [id number] Yes Take 1 tablet by mouth as needed. [provider] Taking Active Self  glucose blood (ONETOUCH ULTRA) test strip [id number] Yes USE AS DIRECTED FOUR TIMES DAILY [name], MD Taking Active   GVOKE HYPOPEN 1-PACK 1 MG/0.2ML Darden Palmer [id number] Yes Inject under skin 0.2 mL as needed for hypoglycemia [name], MD Taking Active   insulin degludec (TRESIBA FLEXTOUCH) 200 UNIT/ML FlexTouch Pen [id number] Yes INJECT 32U into SUBCUTANEOUSLY DAILY [name], MD Taking Active   Insulin Pen Needle (ADVOCATE INSULIN PEN NEEDLES) 31G X 5 MM MISC [id number] Yes Use 3x a day [name], MD Taking Active Self  metFORMIN (GLUCOPHAGE-XR) 500 MG 24 hr tablet [id number] Yes Take 1 tablet (500 mg total) by mouth daily with breakfast. [name], MD Taking Active   NOVOLOG FLEXPEN 100 UNIT/ML FlexPen [id number] Yes INJECT 10-18 UNITS into [place] with meals [name], MD Taking Active   olmesartan-hydrochlorothiazide [facility] HCT) 40-25 MG tablet [id number] Yes TAKE 1 TABLET BY MOUTH EVERY DAY [name], MD Taking Active   OZEMPIC, 1 MG/DOSE, 4 MG/3ML SOPN [id number] Yes Inject 1 mg into the skin once a week. [name], MD Taking Active   pantoprazole (PROTONIX) 40 MG tablet [id number] Yes TAKE ONE TABLET BY MOUTH ONCE DAILY [name], MD Taking  Active   simvastatin (ZOCOR) 20  MG tablet [id number] Yes TAKE 1 TABLET BY MOUTH EVERYDAY AT BEDTIME [name], MD Taking Active   tadalafil (CIALIS) 20 MG tablet [id number] Yes TAKE 1/2 TO 1 TABLET BY MOUTH every other DAY AS NEEDED FOR erectile dysfunction Needs annual physical appt for further refills [name], MD Taking Active             Patient Active Problem List   Diagnosis Date Noted   Pre-operative exam 02/25/2022   Primary osteoarthritis of right knee 01/17/2022   Primary osteoarthritis of left knee 12/27/2021   Type 2 diabetes mellitus with hypoglycemia ([place]) 03/10/2021   Memory impairment 02/10/2021   Non-adherence to medical treatment 10/27/2020   COPD (chronic obstructive pulmonary disease) ([place]) 02/28/2020   Coronary artery calcification seen on CAT scan 02/28/2020   Aortic atherosclerosis ([place]) 02/28/2020   Ex-smoker 01/21/2020   RLQ abdominal pain 01/21/2020   Numbness and tingling in left arm 01/21/2020   Health maintenance examination 01/05/2017   Right knee pain 01/18/2016   Barrett's esophagus 12/08/2015   Abnormal tympanic membrane of right ear 07/05/2015   Advanced care planning/counseling discussion 06/30/2014   Medicare annual wellness visit, subsequent 06/22/2013   Benign prostatic hyperplasia with urinary obstruction 06/18/2013   ED (erectile dysfunction) 04/10/2012   Hyperlipidemia associated with type 2 diabetes mellitus ([place]) 12/29/2008   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 ([place]) 07/24/2007   GERD 07/24/2007    Immunization History  Administered Date(s) Administered   Fluad Quad(high Dose 65+) 06/21/2020   Influenza Whole 06/02/2008, 06/02/2009, 06/02/2010, 07/08/2011, 07/08/2012   Influenza, High Dose Seasonal PF 05/18/2017, 06/25/2018, 04/18/2019, 07/04/2021   Influenza,inj,Quad PF,6+ Mos 07/05/2015   Influenza-Unspecified 05/28/2013, 05/28/2014, 06/30/2016   PFIZER(Purple Top)SARS-COV-2 Vaccination 10/27/2019,  11/25/2019   Pneumococcal Conjugate-13 06/18/2013   Pneumococcal Polysaccharide-23 07/08/2011, 12/29/2016   Tetanus 06/18/2013   Zoster Recombinat (Shingrix) 05/18/2017, 01/14/2018   Zoster, Live 08/26/2014    Conditions to be addressed/monitored:  {USCCMDZASSESSMENTOPTIONS:23563}  There are no care plans that you recently modified to display for this patient.    Medication Assistance: {MEDASSISTANCEINFO:25044}  Compliance/Adherence/Medication fill history: Care Gaps: None  Star-Rating Drugs: Metformin - PDC unavailable Olmesartan/HCTZ - PDC 99% Simvastatin - PDC 99% Ozempic - PDC 78%; LF 01/09/22 x 28 ds  Medication Access: Within the past 30 days, how often has patient missed a dose of medication? *** Is a pillbox or other method used to improve adherence? {YES/NO:21197} Factors that may affect medication adherence? {CHL DESC; BARRIERS:21522} Are meds synced by current pharmacy? {YES/NO:21197} Are meds delivered by current pharmacy? {YES/NO:21197} Does patient experience delays in picking up medications due to transportation concerns? {YES/NO:21197}  [facility] Reviewed: Is patient disadvantaged to use [facility]?: {YES/NO:21197} Current Rx insurance plan: *** Name and location of Current pharmacy:  [facility] - [place], [place] - [address] [place] [address] Phone: [phone] Fax: [phone]  [id number] [place]Phone: [phone]: [phone] [facility] services reviewed with patient today?: Yes  VIAL medications (Delivery 03/15/22) Simvastatin 20 mg Take 1 tablet every day at bedtime OneTouch Test strips - Use up to four times daily Tadalafil 20 mg - 1/2 to 1 tablet daily as needed- PRN use Olmesartan/HCTZ 40-25 mg Take 1 tablet by mouth every day Amlodipine 5 mg 1 tablet daily Pantoprazole    40 mg 1  tablet daily Novolog Flexpen Inject 10-18 units into the skin 3 times daily with meals Tresiba Flex 200 units- 32 units once daily in morning  Gvoke Hypopen - 1 mg - Inject 0.2 mL as needed   Patient declined the following medications this month: Metformin 500 mg ER - take 1 tablet every morning (pt has excess supply as RX was for 3 a day now takes 1) Ozempic 1 mg/dose - Inject once weekly - has 2 boxes on hand   Care Plan and Follow Up Patient Decision:  {FOLLOWUP:24991}  Plan: {CM FOLLOW UP PLAN:25073}  ***     Current Barriers:  Unable to achieve control of diabetes   Pharmacist Clinical Goal(s):  Patient will achieve adherence to monitoring guidelines and medication adherence to achieve therapeutic efficacy through collaboration with PharmD and provider.   Current Barriers:  {pharmacybarriers:24917}  Pharmacist Clinical Goal(s):  Patient will {PHARMACYGOALCHOICES:24921} through collaboration with PharmD and provider.   Interventions: 1:1 collaboration with [name], MD regarding development and update of comprehensive plan of care as evidenced by provider attestation and co-signature Inter-disciplinary care team collaboration (see longitudinal plan of care) Comprehensive medication review performed; medication list updated in electronic medical record  Diabetes (A1c goal <8%) -Not ideally controlled - A1c 7.3% (02/2022); however not correlated with Dexcom GMI 8.6% (03/02/2022), TIR 34% -Follows with Dr Cruzita Lederer (Dexcom); hx of hypoglycemia, previously tried to stop Novolog and replace with Ozempic but had to resume due to high post-prandials (but pt had also stopped Ozempic at the time) -Current medications: Metformin 500 mg daily AM- Appropriate, Effective, Safe, Accessible NovoLog- 08-11-17 units (sliding scale based on meal size), 15 minutes before meals  - Appropriate, Effective, Query Safe  Tresiba 32 units AM- Appropriate, Effective, Safe,  Accessible Ozempic 1 mg weekly (Mon) - Appropriate, Effective, Safe, Query accessible (Cost is concern in coverage gap, pt did not proceed with PAP last year per his preference)  Dexcom G6 Gvoke Hypopen PRN -Medications previously tried: upset stomach on higher dose metformin  -Current meal patterns: eats 2 meals/day (late breakfast ~11 AM, early dinner ~ 5 PM) -Current exercise: tries to walk when he can -Educated on: appropriate admin time for Novolog -Recommend to continue current medication  Hypertension (BP goal < 140/90) Controlled - per clinic readings within goal. Patient does not monitor BP at home. Affirms daily adherence. Current medications: Amlodipine 5 mg daily- Appropriate, Effective, Safe, Accessible Olmesartan-HCTZ 40-25 mg daily - Appropriate, Effective, Safe, Accessible Recommended continue current medications  Hyperlipidemia: (LDL goal < 55) -Controlled - LDL 55 (07/2021) -Hx aortic atherosclerosis, LDL goal < 55 set by cardiology -Current treatment: Simvastatin 20 mg daily HS - Appropriate, Effective, Safe, Accessible -Medications previously tried: n/a  -Current dietary patterns: *** -Current exercise habits: *** -Educated on {CCM HLD Counseling:25126} -{CCMPHARMDINTERVENTION:25122}  Health Maintenance -Vaccine gaps: none -Hx Barrett's esophagus, on pantoprazole -Hx COPD, not requiring inhalers -Hx bilateral knee osteoarthritis, awaiting TKA -Hx Alzheimer's dementia   Patient Goals/Self-Care Activities Patient will:  - {pharmacypatientgoals:24919}

## 2022-03-20 DIAGNOSIS — Z20828 Contact with and (suspected) exposure to other viral communicable diseases: Secondary | ICD-10-CM | POA: Diagnosis not present

## 2022-03-22 ENCOUNTER — Ambulatory Visit (INDEPENDENT_AMBULATORY_CARE_PROVIDER_SITE_OTHER): Payer: Medicare HMO | Admitting: Podiatry

## 2022-03-22 ENCOUNTER — Encounter: Payer: Self-pay | Admitting: Podiatry

## 2022-03-22 DIAGNOSIS — M79675 Pain in left toe(s): Secondary | ICD-10-CM

## 2022-03-22 DIAGNOSIS — E1142 Type 2 diabetes mellitus with diabetic polyneuropathy: Secondary | ICD-10-CM | POA: Diagnosis not present

## 2022-03-22 DIAGNOSIS — B351 Tinea unguium: Secondary | ICD-10-CM | POA: Diagnosis not present

## 2022-03-22 DIAGNOSIS — M79674 Pain in right toe(s): Secondary | ICD-10-CM

## 2022-03-24 ENCOUNTER — Ambulatory Visit (INDEPENDENT_AMBULATORY_CARE_PROVIDER_SITE_OTHER): Payer: Medicare HMO

## 2022-03-24 DIAGNOSIS — Z794 Long term (current) use of insulin: Secondary | ICD-10-CM

## 2022-03-24 DIAGNOSIS — R413 Other amnesia: Secondary | ICD-10-CM

## 2022-03-24 DIAGNOSIS — I10 Essential (primary) hypertension: Secondary | ICD-10-CM

## 2022-03-24 NOTE — Chronic Care Management (AMB) (Signed)
Care Management    RN Visit Note  03/24/2022 Name: Anthony [name] MRN: [id number] DOB: [birth year]  Subjective: Anthony [name] is a 76 y.o. year old male who is a primary care patient of [name], MD. The care management team was consulted for assistance with disease management and care coordination needs.    Engaged with patient by telephone for follow up visit in response to provider referral for case management and/or care coordination services.   Consent to Services:   Mr. Mantel was given information about Care Management services today including:  Care Management services includes personalized support from designated clinical staff supervised by his physician, including individualized plan of care and coordination with other care providers 24/7 contact phone numbers for assistance for urgent and routine care needs. The patient may stop case management services at any time by phone call to the office staff.  Patient agreed to services and consent obtained.   Assessment: Review of patient past medical history, allergies, medications, health status, including review of consultants reports, laboratory and other test data, was performed as part of comprehensive evaluation and provision of chronic care management services.   SDOH (Social Determinants of Health) assessments and interventions performed:    Care Plan  No Known Allergies  Outpatient Encounter Medications as of 03/24/2022  Medication Sig   amLODipine (NORVASC) 5 MG tablet TAKE ONE TABLET BY MOUTH ONCE DAILY   b complex vitamins tablet Take 1 tablet by mouth daily.   GVOKE HYPOPEN 1-PACK 1 MG/0.2ML SOAJ Inject under skin 0.2 mL as needed for hypoglycemia   insulin degludec (TRESIBA FLEXTOUCH) 200 UNIT/ML FlexTouch Pen INJECT 32U into SUBCUTANEOUSLY DAILY   metFORMIN (GLUCOPHAGE-XR) 500 MG 24 hr tablet Take 1 tablet (500 mg total) by mouth daily with breakfast.   NOVOLOG FLEXPEN 100 UNIT/ML FlexPen INJECT  10-18 UNITS into THE SKIN THREE TIMES DAILY with meals   olmesartan-hydrochlorothiazide (BENICAR HCT) 40-25 MG tablet TAKE 1 TABLET BY MOUTH EVERY DAY   OZEMPIC, 1 MG/DOSE, 4 MG/3ML SOPN Inject 1 mg into the skin once a week.   pantoprazole (PROTONIX) 40 MG tablet TAKE ONE TABLET BY MOUTH ONCE DAILY   simvastatin (ZOCOR) 20 MG tablet TAKE 1 TABLET BY MOUTH EVERYDAY AT BEDTIME   tadalafil (CIALIS) 20 MG tablet TAKE 1/2 TO 1 TABLET BY MOUTH every other DAY AS NEEDED FOR erectile dysfunction Needs annual physical appt for further refills   blood glucose meter kit and supplies KIT Dispense based on patient and insurance preference. Use up to four times daily as directed.   Continuous Blood Gluc Transmit (DEXCOM G6 TRANSMITTER) MISC 1 Device by Does not apply route every 3 (three) months.   Dextrose, Diabetic Use, (GLUCOSE PO) Take 1 tablet by mouth as needed.   glucose blood (ONETOUCH ULTRA) test strip USE AS DIRECTED FOUR TIMES DAILY   Insulin Pen Needle (ADVOCATE INSULIN PEN NEEDLES) 31G X 5 MM MISC Use 3x a day   No facility-administered encounter medications on file as of 03/24/2022.    Patient Active Problem List   Diagnosis Date Noted   Pre-operative exam 02/25/2022   Primary osteoarthritis of right knee 01/17/2022   Primary osteoarthritis of left knee 12/27/2021   Type 2 diabetes mellitus with hypoglycemia ([place]) 03/10/2021   Memory impairment 02/10/2021   Non-adherence to medical treatment 10/27/2020   COPD (chronic obstructive pulmonary disease) ([place]) 02/28/2020   Coronary artery calcification seen on CAT scan 02/28/2020   Aortic atherosclerosis ([place]) 02/28/2020   Ex-smoker  01/21/2020   RLQ abdominal pain 01/21/2020   Numbness and tingling in left arm 01/21/2020   Health maintenance examination 01/05/2017   Right knee pain 01/18/2016   Barrett's esophagus 12/08/2015   Abnormal tympanic membrane of right ear 07/05/2015   Advanced care planning/counseling discussion 06/30/2014    Medicare annual wellness visit, subsequent 06/22/2013   Benign prostatic hyperplasia with urinary obstruction 06/18/2013   ED (erectile dysfunction) 04/10/2012   Hyperlipidemia associated with type 2 diabetes mellitus ([place]) 12/29/2008   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 ([place]) 07/24/2007   GERD 07/24/2007    Conditions to be addressed/monitored: HTN, DMII, and memory impairment  Care Plan : [facility] plan of care  Updates made by Dannielle Karvonen, RN since 03/24/2022 12:00 AM     Problem: Chronic disese management education and / or care coordination needs.( Diabetes, Hypertension)   Priority: High     Long-Range Goal: Development of plan of care to address chronic disease management and / or care coordination needs ( DMII, Hypertension)   Expected End Date: 04/27/2022  Priority: High  Note:   Current Barriers:  Knowledge Deficits related to plan of care for management of HTN and DMII  Chronic Disease Management support and education needs related to HTN and DMII Cognitive Deficits Memory impairment Patient reports fasting blood sugar today was 159.  Patient states he still has occasional low blood sugars.  He states his DEXCOM alerts him at 43 and he treats himself with the liquid glucose.  Patient states he received clearance from his cardiologist for his left knee arthroplasty.  He states he is still awaiting clearance from his endocrinologist.  Per chart review patients next follow up with endocrinologist is 07/04/22.   Patient reports his blood pressure has been stable.  Per chart review from cardiology visit on 03/06/22 blood pressure was 138/70.   Patient states he is feeling pretty good overall.  He reports taking his medications as prescribed.  Denies missing any doses.   RNCM Clinical Goal(s):  Patient will verbalize understanding of plan for management of HTN and DMII take all medications exactly as prescribed and will call provider for  medication related questions attend all scheduled medical appointments:  continue to work with RN Care Manager to address care management and care coordination needs related to  HTN and DMII through collaboration with RN Care manager, provider, and care team.  patient will demonstrate improved adherence to prescribed treatment plan for diabetes self care/management as evidenced by: daily monitoring and recording of CBG  adherence to ADA/ carb modified diet adherence to prescribed medication regimen contacting provider for new or worsened symptoms or questions:   Interventions: 1:1 collaboration with primary care provider regarding development and update of comprehensive plan of care as evidenced by provider attestation and co-signature Inter-disciplinary care team collaboration (see longitudinal plan of care) Evaluation of current treatment plan related to  self management and patient's adherence to plan as established by provider  Hypertension Interventions: Goal met Last practice recorded BP readings:  BP Readings from Last 3 Encounters:  03/06/22 138/70  03/02/22 130/82  02/24/22 122/74  Most recent eGFR/CrCl: No results found for: "EGFR"  No components found for: "CRCL"  Evaluation of current treatment plan related to hypertension self management and patient's adherence to plan as established by provider; Reviewed medications with patient and discussed importance of compliance; Reviewed scheduled/upcoming provider appointments  Advised to continue to monitor blood pressure at least 2-3 times per month Advised to  follow a low salt diet.   Memory impairment interventions: Goal Met.  Evaluation of current treatment plan related to  memory impairment , self-management and patient's adherence to plan as established by provider. Reviewed medications with patient and discussed  Reviewed scheduled/upcoming provider appointments   Diabetes Interventions: Resolved due to duplicate  goal Assessed patient's understanding of A1c goal: <7% Evaluation of current treatment plan related to memory impairment, self-management and patient's adherence to plan as established by provider. Discussed plans with patient for ongoing care management follow up and provided patient with direct contact information for care management team Advised patient to notify endocrinologist of  his ongoing/ frequent hypoglycemic events as soon as possible. Patients next appointment with endocrinologist is 01/24/22.  Advised to follow a low carbohydrate/ diabetic diet.  Reviewed signs/ symptoms of hyperglycemia/ hypoglycemia and treatment  Advised patient to notify his endocrinologist that he has decreased his Ozempic  Lab Results  Component Value Date   HGBA1C 7.3 (A) 03/02/2022     Patient Goals/Self-Care Activities: Continue to take medications as prescribed and refill timely Follow up with providers as recommended.  Call provider office for new concerns or questions Check blood pressure 2-3 times per month Reminder:  Consider having family member accompany you to your appointments and assist with medication reminders Monitor blood pressure at least 4 times per day as recommended by your provider.  Continue to follow  a low carbohydrate/ diabetic diet.       Plan: The care management team will reach out to the patient again over the next 45 days.  Quinn Plowman RN,BSN,CCM RN Care Manager Coordinator [place]  [phone]

## 2022-03-24 NOTE — Patient Instructions (Signed)
Visit Information  Thank you for taking time to visit with me today. Please don't hesitate to contact me if I can be of assistance to you before our next scheduled telephone appointment.  Following are the goals we discussed today:  Continue to take medications as prescribed and refill timely Follow up with providers as recommended.  Call provider office for new concerns or questions Check blood pressure 2-3 times per month Reminder:  Consider having family member accompany you to your appointments and assist with medication reminders Monitor blood pressure at least 4 times per day as recommended by your provider.  Continue to follow  a low carbohydrate/ diabetic diet.   Our next appointment is by telephone on 04/26/22 at 1:00 pm  Please call the care guide team at [phone] if you need to cancel or reschedule your appointment.   If you are experiencing a Mental Health or [place] or need someone to talk to, please call the Suicide and Crisis Lifeline: 988 call 1-800-273-TALK (toll free, 24 hour hotline)   The patient verbalized understanding of instructions, educational materials, and care plan provided today and agreed to receive a mailed copy of patient instructions, educational materials, and care plan.   Quinn Plowman RN,BSN,CCM RN Care Manager Coordinator [place]  [phone]

## 2022-03-26 DIAGNOSIS — E113291 Type 2 diabetes mellitus with mild nonproliferative diabetic retinopathy without macular edema, right eye: Secondary | ICD-10-CM | POA: Diagnosis not present

## 2022-03-26 DIAGNOSIS — Z794 Long term (current) use of insulin: Secondary | ICD-10-CM | POA: Diagnosis not present

## 2022-03-27 DIAGNOSIS — I10 Essential (primary) hypertension: Secondary | ICD-10-CM | POA: Diagnosis not present

## 2022-03-27 DIAGNOSIS — E11649 Type 2 diabetes mellitus with hypoglycemia without coma: Secondary | ICD-10-CM

## 2022-03-27 DIAGNOSIS — Z794 Long term (current) use of insulin: Secondary | ICD-10-CM | POA: Diagnosis not present

## 2022-03-27 NOTE — Progress Notes (Signed)
  Subjective:  Patient ID: Anthony [name], male    DOB: Mar 02, 1946,  MRN: [id number]  Anthony [name] presents to clinic today for at risk foot care with history of diabetic neuropathy and painful elongated mycotic toenails 1-5 bilaterally which are tender when wearing enclosed shoe gear. Pain is relieved with periodic professional debridement.  Patient states blood glucose was 132 mg/dl today.  Last A1c was around 7%.  New problem(s): None.   PCP is [name], MD , and last visit was  February 24, 2022  No Known Allergies  Review of Systems: Negative except as noted in the HPI.  Objective: No changes noted in today's physical examination. Anthony [name] is a pleasant 76 y.o. male in NAD. AAO X 3.  Vascular Examination: CFT <3 seconds b/l LE. Palpable DP/PT pulses b/l LE. Digital hair sparse b/l. Skin temperature gradient WNL b/l. No pain with calf compression b/l. No edema noted b/l. No cyanosis or clubbing noted b/l LE.  Dermatological Examination: Pedal skin warm and supple b/l.  No open wounds b/l. No interdigital macerations. Toenails 1-5 b/l elongated, thickened, discolored with subungual debris. +Tenderness with dorsal palpation of nailplates.  Musculoskeletal Examination: Muscle strength 5/5 to all lower extremity muscle groups bilaterally. HAV with bunion deformity noted b/l LE. Hammertoe deformity noted 2-5 b/l.  Neurological Examination: Protective sensation diminished with 10g monofilament b/l.    Latest Ref Rng & Units 03/02/2022    1:17 PM 12/27/2021   12:49 PM 10/24/2021   11:36 AM 07/26/2021   11:09 AM  Hemoglobin A1C  Hemoglobin-A1c 4.0 - 5.6 % 7.3  7.0  6.8  7.0    Assessment/Plan: 1. Pain due to onychomycosis of toenails of both feet   2. Diabetic peripheral neuropathy associated with type 2 diabetes mellitus ([place])     -Patient was evaluated and treated. All patient's and/or POA's questions/concerns answered on today's visit. -Examined  patient. -Patient to continue soft, supportive shoe gear daily. -Mycotic toenails 1-5 bilaterally were debrided in length and girth with sterile nail nippers and dremel without incident. -Patient/POA to call should there be question/concern in the interim.   Return in about 3 months (around 06/22/2022).  Marzetta Board, DPM

## 2022-03-30 ENCOUNTER — Ambulatory Visit
Admission: RE | Admit: 2022-03-30 | Discharge: 2022-03-30 | Disposition: A | Discharge status: Home / Self Care | Payer: Medicare HMO | Source: Ambulatory Visit | Attending: Family Medicine | Admitting: Family Medicine

## 2022-03-30 DIAGNOSIS — Z87891 Personal history of nicotine dependence: Secondary | ICD-10-CM | POA: Diagnosis not present

## 2022-03-30 DIAGNOSIS — I2510 Atherosclerotic heart disease of native coronary artery without angina pectoris: Secondary | ICD-10-CM | POA: Diagnosis not present

## 2022-03-30 DIAGNOSIS — J432 Centrilobular emphysema: Secondary | ICD-10-CM | POA: Diagnosis not present

## 2022-03-30 DIAGNOSIS — I700 Atherosclerosis of aorta: Secondary | ICD-10-CM | POA: Diagnosis not present

## 2022-04-03 ENCOUNTER — Telehealth: Payer: Self-pay

## 2022-04-03 NOTE — Progress Notes (Signed)
Chronic Care Management Pharmacy Assistant   Name: Anthony [name]  MRN: [id number] DOB: 1946-02-09  Reason for Encounter: CCM (Medication Adherence and Delivery Coordination)  Recent office visits:  None since last CCM contact  Recent consult visits:  03/22/22 Anthony [name], DPM (Podiatry) Pain due to Onychomycosis Procedure: Mycotic toenails 1-5 bilaterally were debrided   Hospital visits:  None in previous 6 months  Medications: Outpatient Encounter Medications as of 04/03/2022  Medication Sig   amLODipine (NORVASC) 5 MG tablet TAKE ONE TABLET BY MOUTH ONCE DAILY   b complex vitamins tablet Take 1 tablet by mouth daily.   blood glucose meter kit and supplies KIT Dispense based on patient and insurance preference. Use up to four times daily as directed.   Continuous Blood Gluc Transmit (DEXCOM G6 TRANSMITTER) MISC 1 Device by Does not apply route every 3 (three) months.   Dextrose, Diabetic Use, (GLUCOSE PO) Take 1 tablet by mouth as needed.   glucose blood (ONETOUCH ULTRA) test strip USE AS DIRECTED FOUR TIMES DAILY   GVOKE HYPOPEN 1-PACK 1 MG/0.2ML SOAJ Inject under skin 0.2 mL as needed for hypoglycemia   insulin degludec (TRESIBA FLEXTOUCH) 200 UNIT/ML FlexTouch Pen INJECT 32U into SUBCUTANEOUSLY DAILY   Insulin Pen Needle (ADVOCATE INSULIN PEN NEEDLES) 31G X 5 MM MISC Use 3x a day   metFORMIN (GLUCOPHAGE-XR) 500 MG 24 hr tablet Take 1 tablet (500 mg total) by mouth daily with breakfast.   NOVOLOG FLEXPEN 100 UNIT/ML FlexPen INJECT 10-18 UNITS into THE SKIN THREE TIMES DAILY with meals   olmesartan-hydrochlorothiazide (BENICAR HCT) 40-25 MG tablet TAKE 1 TABLET BY MOUTH EVERY DAY   OZEMPIC, 1 MG/DOSE, 4 MG/3ML SOPN Inject 1 mg into the skin once a week.   pantoprazole (PROTONIX) 40 MG tablet TAKE ONE TABLET BY MOUTH ONCE DAILY   simvastatin (ZOCOR) 20 MG tablet TAKE 1 TABLET BY MOUTH EVERYDAY AT BEDTIME   tadalafil (CIALIS) 20 MG tablet TAKE 1/2 TO 1 TABLET BY MOUTH  every other DAY AS NEEDED FOR erectile dysfunction Needs annual physical appt for further refills   No facility-administered encounter medications on file as of 04/03/2022.   BP Readings from Last 3 Encounters:  03/06/22 138/70  03/02/22 130/82  02/24/22 122/74    Lab Results  Component Value Date   HGBA1C 7.3 (A) 03/02/2022    Recent OV, Consult or Hospital visit:  No medication changes indicated  Last adherence delivery date: 03/15/2022      Patient is due for next adherence delivery on: 04/13/2022  Spoke with patient on 04/03/2022 reviewed medications and coordinated delivery.  VIAL medications: Simvastatin 20 mg Take 1 tablet every day at bedtime OneTouch Test strips - Use up to four times daily Tadalafil 20 mg - 1/2 to 1 tablet daily as needed- PRN use Olmesartan/HCTZ 40-25 mg Take 1 tablet by mouth every day Amlodipine 5 mg 1 tablet daily Pantoprazole   40 mg 1 tablet daily Novolog Flexpen Inject 10-18 units into the skin 3 times daily with meals Tresiba Flex 200 units- 32 units once daily in morning    Patient declined the following medications this month: Metformin 500 mg ER - take 1 tablet every morning (pt has excess supply as RX was for 3 a day now takes 1) Ozempic 1 mg/dose - Inject once weekly - has 2 boxes on hand Gvoke Hypopen - 1 mg - Inject 0.2 mL as needed - has 3 on hand  Any concerns about your medications? No  How often do you forget or accidentally miss a dose? Never   Do you use a pillbox? No   Is patient in packaging No  Refills requested from providers include: Not complete Tadalafil 20 mg - 1/2 to 1 tablet daily as needed- PRN use  Confirmed delivery date of 04/13/2022, advised patient that pharmacy will contact them the morning of delivery.   Recent blood pressure readings are as follows: Patient does not take blood pressure at home.   Recent blood glucose readings are as follows: Patient was not at home for readings.   Annual wellness  visit in last year? Yes 07/26/2021 Most Recent BP reading: 130/82 on 03/02/2022   If Diabetic: Most recent A1C reading: 7.3 on 03/02/2022 Last eye exam / retinopathy screening: Up to date Last diabetic foot exam: Up to date  Cycle dispensing form sent to [facility], CPP for review.

## 2022-04-05 ENCOUNTER — Telehealth: Payer: Self-pay | Admitting: Acute Care

## 2022-04-05 DIAGNOSIS — R911 Solitary pulmonary nodule: Secondary | ICD-10-CM

## 2022-04-05 DIAGNOSIS — Z87891 Personal history of nicotine dependence: Secondary | ICD-10-CM

## 2022-04-05 NOTE — Telephone Encounter (Signed)
I have called the patient with the results of his low dose CT Chest. His PCP Dr. Danise Mina had already called him  with the results. His scan was read as a LR 0 due to a left lower lobe infiltrate. He denies any symptoms of fever , change in amount or color of secretions or worsening dyspnea.  Plan is for a 3 month follow up to better evaluate for resolution of infiltrate and pulmonary nodules previously obscured by the infiltrate. Pt. Is in agreement with this plan.  Denise, 3 month follow up low Dose CT Chest . Dr. Darnell Level is already aware of results. Thanks so much

## 2022-04-05 NOTE — Telephone Encounter (Signed)
Order placed for 3 months LCS follow up CT

## 2022-04-10 ENCOUNTER — Other Ambulatory Visit: Payer: Self-pay | Admitting: Family Medicine

## 2022-04-11 NOTE — Telephone Encounter (Signed)
E-scribed refills.  Pt has MCR wellness on 04/13/22.  Plz schedule cpe and lab visits after this date.

## 2022-04-12 NOTE — Telephone Encounter (Signed)
Noted  

## 2022-04-12 NOTE — Telephone Encounter (Signed)
Patient scheduled.

## 2022-04-13 ENCOUNTER — Ambulatory Visit (INDEPENDENT_AMBULATORY_CARE_PROVIDER_SITE_OTHER): Payer: Medicare HMO

## 2022-04-13 VITALS — Ht 72.0 in | Wt 220.0 lb

## 2022-04-13 DIAGNOSIS — Z0000 Encounter for general adult medical examination without abnormal findings: Secondary | ICD-10-CM | POA: Diagnosis not present

## 2022-04-13 NOTE — Progress Notes (Addendum)
I connected with Anthony [name] today by telephone and verified that I am speaking with the correct person using two identifiers. Location patient: home Location provider: work Persons participating in the virtual visit: Anthony [name], Glenna Durand LPN.   I discussed the limitations, risks, security and privacy concerns of performing an evaluation and management service by telephone and the availability of in person appointments. I also discussed with the patient that there may be a patient responsible charge related to this service. The patient expressed understanding and verbally consented to this telephonic visit.    Interactive audio and video telecommunications were attempted between this provider and patient, however failed, due to patient having technical difficulties OR patient did not have access to video capability.  We continued and completed visit with audio only.     Vital signs may be patient reported or missing.  Subjective:   Anthony [name] is a 76 y.o. male who presents for Medicare Annual/Subsequent preventive examination.  Review of Systems     Cardiac Risk Factors include: advanced age (>65mn, >>38women);diabetes mellitus;male gender     Objective:    Today's Vitals   04/13/22 1357 04/13/22 1400  Weight: 220 lb (99.8 kg)   Height: 6' (1.829 m)   PainSc:  6    Body mass index is 29.84 kg/m.     04/13/2022    2:08 PM 02/15/2021    1:01 PM 12/15/2020   10:00 AM 12/01/2020   11:24 AM 03/02/2020    9:49 AM 01/12/2020   12:02 PM 01/09/2019   12:22 PM  Advanced Directives  Does Patient Have a Medical Advance Directive? No No;Yes Yes Yes No Yes Yes  Type of AComptrollerLiving will [place]Jen[place]Living will [place]ady LakeLiving will HDelray BeachLiving will [place]am[place] ValleyLiving will  Does patient want to make changes to medical advance directive?  Yes  (MAU/Ambulatory/Procedural Areas - Information given) No - Patient declined No - Patient declined     Copy of HLydiain Chart?   No - copy requested No - copy requested  No - copy requested No - copy requested    Current Medications (verified) Outpatient Encounter Medications as of 04/13/2022  Medication Sig   amLODipine (NORVASC) 5 MG tablet TAKE ONE TABLET BY MOUTH ONCE DAILY   b complex vitamins tablet Take 1 tablet by mouth daily.   blood glucose meter kit and supplies KIT Dispense based on patient and insurance preference. Use up to four times daily as directed.   Continuous Blood Gluc Transmit (DEXCOM G6 TRANSMITTER) MISC 1 Device by Does not apply route every 3 (three) months.   Dextrose, Diabetic Use, (GLUCOSE PO) Take 1 tablet by mouth as needed.   glucose blood (ONETOUCH ULTRA) test strip USE AS DIRECTED FOUR TIMES DAILY   insulin degludec (TRESIBA FLEXTOUCH) 200 UNIT/ML FlexTouch Pen INJECT 32U into SUBCUTANEOUSLY DAILY   Insulin Pen Needle (ADVOCATE INSULIN PEN NEEDLES) 31G X 5 MM MISC Use 3x a day   metFORMIN (GLUCOPHAGE-XR) 500 MG 24 hr tablet Take 1 tablet (500 mg total) by mouth daily with breakfast.   NOVOLOG FLEXPEN 100 UNIT/ML FlexPen INJECT 10-18 UNITS into THE SKIN THREE TIMES DAILY with meals   olmesartan-hydrochlorothiazide (BENICAR HCT) 40-25 MG tablet TAKE ONE TABLET BY MOUTH ONCE DAILY   pantoprazole (PROTONIX) 40 MG tablet TAKE ONE TABLET BY MOUTH ONCE DAILY   simvastatin (ZOCOR) 20 MG tablet TAKE 1  TABLET BY MOUTH EVERYDAY AT BEDTIME   tadalafil (CIALIS) 20 MG tablet Take 1/2 to 1 tablet by mouth every other day as needed for erectile dysfunction   GVOKE HYPOPEN 1-PACK 1 MG/0.2ML SOAJ Inject under skin 0.2 mL as needed for hypoglycemia   OZEMPIC, 1 MG/DOSE, 4 MG/3ML SOPN Inject 1 mg into the skin once a week. (Patient not taking: Reported on 04/13/2022)   No facility-administered encounter medications on file as of 04/13/2022.    Allergies  (verified) Patient has no known allergies.   History: Past Medical History:  Diagnosis Date   Aortic atherosclerosis ([place]) 02/28/2020   By CT   Barrett's esophagus 12/08/2015   By EGD 2010 [name])    BPH with obstruction/lower urinary tract symptoms 2014   s/p TURP   COPD (chronic obstructive pulmonary disease) ([place]) 02/28/2020   By CT: diffuse bronchial wall thickening with mild centrilobular and paraseptal emphysema (01/2020)   Coronary artery calcification seen on CAT scan 02/28/2020   3v CAD - calcified atherosclerotic plaque in the left main, left anterior descending and left circumflex coronary arteries (01/2020)   Diabetes mellitus type 2 with complications ([place])    completed DMSE   DKA (diabetic ketoacidoses) 01/2010   "in coma"   Ex-smoker 01/21/2020   GERD (gastroesophageal reflux disease)    HTN (hypertension)    Hyperlipidemia    controlled with medicine   Osteoarthritis    Rheumatic fever    maybe as child   Past Surgical History:  Procedure Laterality Date   ABI  12/2013   WNL   APPENDECTOMY     CATARACT EXTRACTION W/PHACO Right 12/01/2020   Procedure: CATARACT EXTRACTION PHACO AND INTRAOCULAR LENS PLACEMENT ([place])  RIGHT VIVITY TORIC LENS DIABETIC;  Surgeon: Leandrew Koyanagi, MD;  Location: [place];  Service: Ophthalmology;  Laterality: Right;  6.93 1:12.8 9.5%   CATARACT EXTRACTION W/PHACO Left 12/15/2020   Procedure: CATARACT EXTRACTION PHACO AND INTRAOCULAR LENS PLACEMENT ([place])  VIVITY TORIC LENS DIABETIC LEFT EYE;  Surgeon: Leandrew Koyanagi, MD;  Location: [place];  Service: Ophthalmology;  Laterality: Left;  3.47 1:03.8 5.5%   CHOLECYSTECTOMY     years ago   COLONOSCOPY  01/2009   TA, rec rpt 5 yrs [name])   COLONOSCOPY  01/2018   TAx2, diverticulosis, int hemorrhoids, rpt 5 yrs ([name])   ESOPHAGOGASTRODUODENOSCOPY  01/2009   biopsy with Barrett's   ESOPHAGOGASTRODUODENOSCOPY  01/2018   WNL - no barrett's ([name])    INGUINAL HERNIA REPAIR Bilateral 03/04/2020   Procedure: LAPAROSCOPIC BILATERAL INGUINAL HERNIA REPAIR WITH MESH;  Surgeon: [name], MD;  Location: [place];  Service: General;  Laterality: Bilateral;   LAPAROSCOPIC APPENDECTOMY  07/05/2011   Procedure: APPENDECTOMY LAPAROSCOPIC;  Surgeon: [name], MD;  Location: [place];  Service: General;  Laterality: N/A;   MENISCUS REPAIR Right 2018   [name] @ [facility]   TRANSURETHRAL RESECTION OF PROSTATE N/A 07/28/2013   Procedure: TRANSURETHRAL RESECTION OF THE PROSTATE WITH GYRUS INSTRUMENTS;  Surgeon: [name], MD   VASECTOMY     WISDOM TOOTH EXTRACTION     Family History  Problem Relation Age of Onset   Lymphoma Mother        Non-hodgkins   Coronary artery disease Mother    Heart disease Mother    Cancer Father    Alcohol abuse Father    Diabetes Brother    Pancreatic cancer Maternal Uncle    Diabetes Brother    Diabetes Brother  Diabetes Other    Colon cancer Neg Hx    Lung cancer Neg Hx    Esophageal cancer Neg Hx    Rectal cancer Neg Hx    Stomach cancer Neg Hx    Social History   Socioeconomic History   Marital status: Married    Spouse name: Not on file   Number of children: Not on file   Years of education: Not on file   Highest education level: Not on file  Occupational History   Not on file  Tobacco Use   Smoking status: Former    Packs/day: 0.30    Years: 50.00    Total pack years: 15.00    Types: Cigarettes    Quit date: 08/29/2011    Years since quitting: 10.6   Smokeless tobacco: Never  Vaping Use   Vaping Use: Former  Substance and Sexual Activity   Alcohol use: Yes    Alcohol/week: 7.0 standard drinks of alcohol    Types: 7 Standard drinks or equivalent per week    Comment: 1- 2 ounces a night   Drug use: No   Sexual activity: Not Currently  Other Topics Concern   Not on file  Social History Narrative   MD ROSTER:   GI [name]   GS - Young      Daily Caffeine Use:   2   Owner/operater of dental lab-sold, but continues to work. Fully retired.   Married 1969   2 daughters 1972, 1976; 1 son 7   7 grandchildren   Edu: 10th Grade   Hobby: car restoration: has '66 Vette, two classic Chevelle SS's and is restoring a '37 chevy coup   Regular Exercise -  NO   Diet: good water, fruits/vegetables daily   Social Determinants of Health   Financial Resource Strain: Low Risk  (04/13/2022)   Overall Financial Resource Strain (CARDIA)    Difficulty of Paying Living Expenses: Not hard at all  Food Insecurity: No Food Insecurity (04/13/2022)   Hunger Vital Sign    Worried About Running Out of Food in the Last Year: Never true    Ran Out of Food in the Last Year: Never true  Transportation Needs: No Transportation Needs (04/13/2022)   PRAPARE - Hydrologist (Medical): No    Lack of Transportation (Non-Medical): No  Physical Activity: Inactive (04/13/2022)   Exercise Vital Sign    Days of Exercise per Week: 0 days    Minutes of Exercise per Session: 0 min  Stress: No Stress Concern Present (04/13/2022)   [place]    Feeling of Stress : Not at all  Social Connections: Not on file    Tobacco Counseling Counseling given: Not Answered   Clinical Intake:  Pre-visit preparation completed: Yes  Pain : 0-10 Pain Score: 6  Pain Type: Chronic pain Pain Location: Knee Pain Orientation: Left, Right Pain Onset: More than a month ago Pain Frequency: Constant     Nutritional Status: BMI 25 -29 Overweight Nutritional Risks: None Diabetes: Yes  How often do you need to have someone help you when you read instructions, pamphlets, or other written materials from your doctor or pharmacy?: 1 - Never What is the last grade level you completed in school?: 11th grade  Diabetic? Yes Nutrition Risk Assessment:  Has the patient had any N/V/D within the last 2 months?  No   Does the patient have any non-healing wounds?  No  Has the patient had any unintentional weight loss or weight gain?  No   Diabetes:  Is the patient diabetic?  Yes  If diabetic, was a CBG obtained today?  No  Did the patient bring in their glucometer from home?  No  How often do you monitor your CBG's? frequently.   Financial Strains and Diabetes Management:  Are you having any financial strains with the device, your supplies or your medication? No .  Does the patient want to be seen by Chronic Care Management for management of their diabetes?  No  Would the patient like to be referred to a Nutritionist or for Diabetic Management?  No   Diabetic Exams:  Diabetic Eye Exam: Completed 09/23/2021 Diabetic Foot Exam: Completed 09/16/2021   Interpreter Needed?: No  Information entered by :: NAllen LPN   Activities of Daily Living    04/13/2022    2:09 PM  In your present state of health, do you have any difficulty performing the following activities:  Hearing? 0  Vision? 0  Difficulty concentrating or making decisions? 0  Comment trouble some names of things  Walking or climbing stairs? 1  Dressing or bathing? 0  Doing errands, shopping? 0  Preparing Food and eating ? N  Using the Toilet? N  In the past six months, have you accidently leaked urine? N  Do you have problems with loss of bowel control? N  Managing your Medications? N  Managing your Finances? N  Housekeeping or managing your Housekeeping? N    Patient Care Team: [name], MD as PCP - General (Family Medicine) Werner Lean, MD as PCP - Cardiology (Cardiology) [name], MD (Neurosurgery) [name], MD as Consulting Physician (General Surgery) [name], MD (Inactive) as Consulting Physician (Urology) Thelma Comp, [place] as Consulting Physician (Optometry) Debbora Dus, [facility] as Pharmacist (Pharmacist) Dannielle Karvonen, RN as [place] Management  Indicate  any recent Medical Services you may have received from other than [facility] providers in the past year (date may be approximate).     Assessment:   This is a routine wellness examination for D[place].  Hearing/Vision screen Vision Screening - Comments:: Regular eye exams, [facility]  Dietary issues and exercise activities discussed: Current Exercise Habits: The patient does not participate in regular exercise at present   Goals Addressed             This Visit's Progress    Patient Stated       04/13/2022, start exercising regularly       Depression Screen    04/13/2022    2:09 PM 02/16/2021    9:27 AM 02/15/2021    1:09 PM 01/12/2020   12:05 PM 01/09/2019   12:20 PM 01/01/2018   10:59 AM 12/29/2016    1:00 PM  PHQ 2/9 Scores  PHQ - 2 Score 0 0 0 0 0 0 0  PHQ- 9 Score    0 0 0     Fall Risk    04/13/2022    2:09 PM 03/08/2021    2:08 PM 02/15/2021    1:08 PM 01/12/2020   12:03 PM 01/09/2019   12:20 PM  [place] in the past year? 0 0  0 0  Comment   patient states unsure.  He states he does not remember.  He states he has had several incidents of low blood sugar with paramedic assistance.    Number falls in past yr:  0 0  0   Injury with Fall? 0 0  0   Risk for fall due to : Medication side effect   Medication side effect   Follow up Falls evaluation completed;Education provided;Falls prevention discussed   Falls evaluation completed;Falls prevention discussed     FALL RISK PREVENTION PERTAINING TO THE HOME:  Any stairs in or around the home? Yes  If so, are there any without handrails? No  Home free of loose throw rugs in walkways, pet beds, electrical cords, etc? Yes  Adequate lighting in your home to reduce risk of falls? Yes   ASSISTIVE DEVICES UTILIZED TO PREVENT FALLS:  Life alert? No  Use of a cane, walker or w/c? No  Grab bars in the bathroom? Yes  Shower chair or bench in shower? Yes  Elevated toilet seat or a handicapped toilet? No   TIMED UP  AND GO:  Was the test performed? No .      Cognitive Function:    01/12/2020   12:07 PM 01/09/2019    3:30 PM 01/01/2018   11:00 AM 12/29/2016    1:00 PM  MMSE - Mini Mental State Exam  Orientation to time 5 5 5 5   Orientation to Place 5 5 5 5   Registration 3 3 3 3   Attention/ Calculation 5 0 0 0  Recall 3 3 3 3   Language- name 2 objects  0 0 0  Language- repeat 1 1 1 1   Language- follow 3 step command  0 3 3  Language- read & follow direction  0 0 0  Write a sentence  0 0 0  Copy design  0 0 0  Total score  17 20 20         04/13/2022    2:14 PM  6CIT Screen  What Year? 0 points  What month? 0 points  What time? 0 points  Count back from 20 2 points  Months in reverse 4 points  Repeat phrase 2 points  Total Score 8 points    Immunizations Immunization History  Administered Date(s) Administered   Fluad Quad(high Dose 65+) 06/21/2020   Influenza Whole 06/02/2008, 06/02/2009, 06/02/2010, 07/08/2011, 07/08/2012   Influenza, High Dose Seasonal PF 05/18/2017, 06/25/2018, 04/18/2019, 07/04/2021   Influenza,inj,Quad PF,6+ Mos 07/05/2015   Influenza-Unspecified 05/28/2013, 05/28/2014, 06/30/2016   PFIZER(Purple Top)SARS-COV-2 Vaccination 10/27/2019, 11/25/2019   Pneumococcal Conjugate-13 06/18/2013   Pneumococcal Polysaccharide-23 07/08/2011, 12/29/2016   Tetanus 06/18/2013   Zoster Recombinat (Shingrix) 05/18/2017, 01/14/2018   Zoster, Live 08/26/2014    TDAP status: Up to date  Flu Vaccine status: Due, Education has been provided regarding the importance of this vaccine. Advised may receive this vaccine at local pharmacy or Health Dept. Aware to provide a copy of the vaccination record if obtained from local pharmacy or Health Dept. Verbalized acceptance and understanding.  Pneumococcal vaccine status: Up to date  Covid-19 vaccine status: Completed vaccines  Qualifies for Shingles Vaccine? Yes   Zostavax completed Yes   Shingrix Completed?: Yes  Screening  Tests Health Maintenance  Topic Date Due   COVID-19 Vaccine (3 - Pfizer series) 01/20/2020   INFLUENZA VACCINE  03/28/2022   HEMOGLOBIN A1C  09/02/2022   FOOT EXAM  09/16/2022   OPHTHALMOLOGY EXAM  09/23/2022   COLONOSCOPY (Pts 45-27yr Insurance coverage will need to be confirmed)  02/21/2023   TETANUS/TDAP  06/19/2023   Pneumonia Vaccine 76 Years old  Completed   Hepatitis C Screening  Completed   Zoster Vaccines- Shingrix  Completed  HPV VACCINES  Aged Out    Health Maintenance  Health Maintenance Due  Topic Date Due   COVID-19 Vaccine (3 - Pfizer series) 01/20/2020   INFLUENZA VACCINE  03/28/2022    Colorectal cancer screening: Type of screening: Colonoscopy. Completed 02/20/2018. Repeat every 5 years  Lung Cancer Screening: (Low Dose CT Chest recommended if Age 30-80 years, 30 pack-year currently smoking OR have quit w/in 15years.) does qualify.   Lung Cancer Screening Referral: CT scan 03/30/2022  Additional Screening:  Hepatitis C Screening: does qualify; Completed 01/05/2017  Vision Screening: Recommended annual ophthalmology exams for early detection of glaucoma and other disorders of the eye. Is the patient up to date with their annual eye exam?  Yes  Who is the provider or what is the name of the office in which the patient attends annual eye exams? [facility] If pt is not established with a provider, would they like to be referred to a provider to establish care? No .   Dental Screening: Recommended annual dental exams for proper oral hygiene  Community Resource Referral / Chronic Care Management: CRR required this visit?  No   CCM required this visit?  No      Plan:     I have personally reviewed and noted the following in the patient's chart:   Medical and social history Use of alcohol, tobacco or illicit drugs  Current medications and supplements including opioid prescriptions. Patient is not currently taking opioid  prescriptions. Functional ability and status Nutritional status Physical activity Advanced directives List of other physicians Hospitalizations, surgeries, and ER visits in previous 12 months Vitals Screenings to include cognitive, depression, and falls Referrals and appointments  In addition, I have reviewed and discussed with patient certain preventive protocols, quality metrics, and best practice recommendations. A written personalized care plan for preventive services as well as general preventive health recommendations were provided to patient.     Kellie Simmering, LPN   [id number]   Nurse Notes: none  Due to this being a virtual visit, the after visit summary with patients personalized plan was offered to patient via mail or my-chart. Patient would like to access on my-chart

## 2022-04-13 NOTE — Patient Instructions (Signed)
[name] , Thank you for taking time to come for your Medicare Wellness Visit. I appreciate your ongoing commitment to your health goals. Please review the following plan we discussed and let me know if I can assist you in the future.   Screening recommendations/referrals: Colonoscopy: completed 02/20/2018, due 02/21/2023 Recommended yearly ophthalmology/optometry visit for glaucoma screening and checkup Recommended yearly dental visit for hygiene and checkup  Vaccinations: Influenza vaccine: due Pneumococcal vaccine: completed 12/29/2016 Tdap vaccine: completed 06/18/2013, due 06/19/2023 Shingles vaccine: completed   Covid-19:  11/25/2019, 10/27/2019  Advanced directives: Advance directive discussed with you today.   Conditions/risks identified: none  Next appointment: Follow up in one year for your annual wellness visit.   Preventive Care 54 Years and Older, Male Preventive care refers to lifestyle choices and visits with your health care provider that can promote health and wellness. What does preventive care include? A yearly physical exam. This is also called an annual well check. Dental exams once or twice a year. Routine eye exams. Ask your health care provider how often you should have your eyes checked. Personal lifestyle choices, including: Daily care of your teeth and gums. Regular physical activity. Eating a healthy diet. Avoiding tobacco and drug use. Limiting alcohol use. Practicing safe sex. Taking low doses of aspirin every day. Taking vitamin and mineral supplements as recommended by your health care provider. What happens during an annual well check? The services and screenings done by your health care provider during your annual well check will depend on your age, overall health, lifestyle risk factors, and family history of disease. Counseling  Your health care provider may ask you questions about your: Alcohol use. Tobacco use. Drug use. Emotional  well-being. Home and relationship well-being. Sexual activity. Eating habits. History of falls. Memory and ability to understand (cognition). Work and work Statistician. Screening  You may have the following tests or measurements: Height, weight, and BMI. Blood pressure. Lipid and cholesterol levels. These may be checked every 5 years, or more frequently if you are over 86 years old. Skin check. Lung cancer screening. You may have this screening every year starting at age 42 if you have a 30-pack-year history of smoking and currently smoke or have quit within the past 15 years. Fecal occult blood test (FOBT) of the stool. You may have this test every year starting at age 66. Flexible sigmoidoscopy or colonoscopy. You may have a sigmoidoscopy every 5 years or a colonoscopy every 10 years starting at age 74. Prostate cancer screening. Recommendations will vary depending on your family history and other risks. Hepatitis C blood test. Hepatitis B blood test. Sexually transmitted disease (STD) testing. Diabetes screening. This is done by checking your blood sugar (glucose) after you have not eaten for a while (fasting). You may have this done every 1-3 years. Abdominal aortic aneurysm (AAA) screening. You may need this if you are a current or former smoker. Osteoporosis. You may be screened starting at age 66 if you are at high risk. Talk with your health care provider about your test results, treatment options, and if necessary, the need for more tests. Vaccines  Your health care provider may recommend certain vaccines, such as: Influenza vaccine. This is recommended every year. Tetanus, diphtheria, and acellular pertussis (Tdap, Td) vaccine. You may need a Td booster every 10 years. Zoster vaccine. You may need this after age 64. Pneumococcal 13-valent conjugate (PCV13) vaccine. One dose is recommended after age 73. Pneumococcal polysaccharide (PPSV23) vaccine. One dose is recommended  after  age 48. Talk to your health care provider about which screenings and vaccines you need and how often you need them. This information is not intended to replace advice given to you by your health care provider. Make sure you discuss any questions you have with your health care provider. Document Released: 09/10/2015 Document Revised: 05/03/2016 Document Reviewed: 06/15/2015 Elsevier Interactive Patient Education  2017 [employer] Prevention in the Home Falls can cause injuries. They can happen to people of all ages. There are many things you can do to make your home safe and to help prevent falls. What can I do on the outside of my home? Regularly fix the edges of walkways and driveways and fix any cracks. Remove anything that might make you trip as you walk through a door, such as a raised step or threshold. Trim any bushes or trees on the path to your home. Use bright outdoor lighting. Clear any walking paths of anything that might make someone trip, such as rocks or tools. Regularly check to see if handrails are loose or broken. Make sure that both sides of any steps have handrails. Any raised decks and porches should have guardrails on the edges. Have any leaves, snow, or ice cleared regularly. Use sand or salt on walking paths during winter. Clean up any spills in your garage right away. This includes oil or grease spills. What can I do in the bathroom? Use night lights. Install grab bars by the toilet and in the tub and shower. Do not use towel bars as grab bars. Use non-skid mats or decals in the tub or shower. If you need to sit down in the shower, use a plastic, non-slip stool. Keep the floor dry. Clean up any water that spills on the floor as soon as it happens. Remove soap buildup in the tub or shower regularly. Attach bath mats securely with double-sided non-slip rug tape. Do not have throw rugs and other things on the floor that can make you trip. What can I do in the  bedroom? Use night lights. Make sure that you have a light by your bed that is easy to reach. Do not use any sheets or blankets that are too big for your bed. They should not hang down onto the floor. Have a firm chair that has side arms. You can use this for support while you get dressed. Do not have throw rugs and other things on the floor that can make you trip. What can I do in the kitchen? Clean up any spills right away. Avoid walking on wet floors. Keep items that you use a lot in easy-to-reach places. If you need to reach something above you, use a strong step stool that has a grab bar. Keep electrical cords out of the way. Do not use floor polish or wax that makes floors slippery. If you must use wax, use non-skid floor wax. Do not have throw rugs and other things on the floor that can make you trip. What can I do with my stairs? Do not leave any items on the stairs. Make sure that there are handrails on both sides of the stairs and use them. Fix handrails that are broken or loose. Make sure that handrails are as long as the stairways. Check any carpeting to make sure that it is firmly attached to the stairs. Fix any carpet that is loose or worn. Avoid having throw rugs at the top or bottom of the stairs. If you  do have throw rugs, attach them to the floor with carpet tape. Make sure that you have a light switch at the top of the stairs and the bottom of the stairs. If you do not have them, ask someone to add them for you. What else can I do to help prevent falls? Wear shoes that: Do not have high heels. Have rubber bottoms. Are comfortable and fit you well. Are closed at the toe. Do not wear sandals. If you use a stepladder: Make sure that it is fully opened. Do not climb a closed stepladder. Make sure that both sides of the stepladder are locked into place. Ask someone to hold it for you, if possible. Clearly mark and make sure that you can see: Any grab bars or  handrails. First and last steps. Where the edge of each step is. Use tools that help you move around (mobility aids) if they are needed. These include: Canes. Walkers. Scooters. Crutches. Turn on the lights when you go into a dark area. Replace any light bulbs as soon as they burn out. Set up your furniture so you have a clear path. Avoid moving your furniture around. If any of your floors are uneven, fix them. If there are any pets around you, be aware of where they are. Review your medicines with your doctor. Some medicines can make you feel dizzy. This can increase your chance of falling. Ask your doctor what other things that you can do to help prevent falls. This information is not intended to replace advice given to you by your health care provider. Make sure you discuss any questions you have with your health care provider. Document Released: 06/10/2009 Document Revised: 01/20/2016 Document Reviewed: 09/18/2014 Elsevier Interactive Patient Education  2017 [employer].

## 2022-04-14 ENCOUNTER — Other Ambulatory Visit (INDEPENDENT_AMBULATORY_CARE_PROVIDER_SITE_OTHER): Payer: Medicare HMO

## 2022-04-14 ENCOUNTER — Other Ambulatory Visit: Payer: Self-pay | Admitting: Family Medicine

## 2022-04-14 DIAGNOSIS — E1169 Type 2 diabetes mellitus with other specified complication: Secondary | ICD-10-CM | POA: Diagnosis not present

## 2022-04-14 DIAGNOSIS — N401 Enlarged prostate with lower urinary tract symptoms: Secondary | ICD-10-CM | POA: Diagnosis not present

## 2022-04-14 DIAGNOSIS — E785 Hyperlipidemia, unspecified: Secondary | ICD-10-CM

## 2022-04-14 DIAGNOSIS — E11649 Type 2 diabetes mellitus with hypoglycemia without coma: Secondary | ICD-10-CM | POA: Diagnosis not present

## 2022-04-14 DIAGNOSIS — N138 Other obstructive and reflux uropathy: Secondary | ICD-10-CM | POA: Diagnosis not present

## 2022-04-14 DIAGNOSIS — E113299 Type 2 diabetes mellitus with mild nonproliferative diabetic retinopathy without macular edema, unspecified eye: Secondary | ICD-10-CM | POA: Diagnosis not present

## 2022-04-14 LAB — COMPREHENSIVE METABOLIC PANEL
ALT: 14 U/L (ref 0–53)
AST: 13 U/L (ref 0–37)
Albumin: 4 g/dL (ref 3.5–5.2)
Alkaline Phosphatase: 83 U/L (ref 39–117)
BUN: 15 mg/dL (ref 6–23)
CO2: 28 mEq/L (ref 19–32)
Calcium: 9.1 mg/dL (ref 8.4–10.5)
Chloride: 103 mEq/L (ref 96–112)
Creatinine, Ser: 0.93 mg/dL (ref 0.40–1.50)
GFR: 81.27 mL/min (ref >60.00)
Glucose, Bld: 178 mg/dL — ABNORMAL HIGH (ref 70–99)
Potassium: 3.6 mEq/L (ref 3.5–5.1)
Sodium: 140 mEq/L (ref 135–145)
Total Bilirubin: 1 mg/dL (ref 0.2–1.2)
Total Protein: 6.1 g/dL (ref 6.0–8.3)

## 2022-04-14 LAB — TSH: TSH: 2.05 u[IU]/mL (ref 0.35–5.50)

## 2022-04-14 LAB — MICROALBUMIN / CREATININE URINE RATIO
Creatinine,U: 142 mg/dL
Microalb Creat Ratio: 4.6 mg/g (ref 0.0–30.0)
Microalb, Ur: 6.6 mg/dL — ABNORMAL HIGH (ref 0.0–1.9)

## 2022-04-14 LAB — LIPID PANEL
Cholesterol: 128 mg/dL (ref 0–200)
HDL: 40.8 mg/dL (ref >39.00)
LDL Cholesterol: 64 mg/dL (ref 0–99)
NonHDL: 87.29
Total CHOL/HDL Ratio: 3
Triglycerides: 115 mg/dL (ref 0.0–149.0)
VLDL: 23 mg/dL (ref 0.0–40.0)

## 2022-04-14 LAB — PSA: PSA: 2.73 ng/mL (ref 0.10–4.00)

## 2022-04-19 ENCOUNTER — Encounter: Payer: Self-pay | Admitting: Family Medicine

## 2022-04-19 ENCOUNTER — Ambulatory Visit (INDEPENDENT_AMBULATORY_CARE_PROVIDER_SITE_OTHER): Payer: Medicare HMO | Admitting: Family Medicine

## 2022-04-19 VITALS — BP 124/72 | HR 78 | Temp 97.8°F | Ht 70.5 in | Wt 224.1 lb

## 2022-04-19 DIAGNOSIS — Z794 Long term (current) use of insulin: Secondary | ICD-10-CM

## 2022-04-19 DIAGNOSIS — J449 Chronic obstructive pulmonary disease, unspecified: Secondary | ICD-10-CM

## 2022-04-19 DIAGNOSIS — Z87891 Personal history of nicotine dependence: Secondary | ICD-10-CM

## 2022-04-19 DIAGNOSIS — Z0000 Encounter for general adult medical examination without abnormal findings: Secondary | ICD-10-CM | POA: Diagnosis not present

## 2022-04-19 DIAGNOSIS — I2510 Atherosclerotic heart disease of native coronary artery without angina pectoris: Secondary | ICD-10-CM

## 2022-04-19 DIAGNOSIS — I700 Atherosclerosis of aorta: Secondary | ICD-10-CM | POA: Diagnosis not present

## 2022-04-19 DIAGNOSIS — E11649 Type 2 diabetes mellitus with hypoglycemia without coma: Secondary | ICD-10-CM | POA: Diagnosis not present

## 2022-04-19 DIAGNOSIS — R310 Gross hematuria: Secondary | ICD-10-CM

## 2022-04-19 DIAGNOSIS — G3184 Mild cognitive impairment, so stated: Secondary | ICD-10-CM

## 2022-04-19 DIAGNOSIS — K219 Gastro-esophageal reflux disease without esophagitis: Secondary | ICD-10-CM | POA: Diagnosis not present

## 2022-04-19 DIAGNOSIS — E113299 Type 2 diabetes mellitus with mild nonproliferative diabetic retinopathy without macular edema, unspecified eye: Secondary | ICD-10-CM | POA: Diagnosis not present

## 2022-04-19 DIAGNOSIS — E785 Hyperlipidemia, unspecified: Secondary | ICD-10-CM

## 2022-04-19 DIAGNOSIS — Z01818 Encounter for other preprocedural examination: Secondary | ICD-10-CM

## 2022-04-19 DIAGNOSIS — N401 Enlarged prostate with lower urinary tract symptoms: Secondary | ICD-10-CM

## 2022-04-19 DIAGNOSIS — K2270 Barrett's esophagus without dysplasia: Secondary | ICD-10-CM | POA: Diagnosis not present

## 2022-04-19 DIAGNOSIS — E1169 Type 2 diabetes mellitus with other specified complication: Secondary | ICD-10-CM | POA: Diagnosis not present

## 2022-04-19 DIAGNOSIS — M1712 Unilateral primary osteoarthritis, left knee: Secondary | ICD-10-CM

## 2022-04-19 DIAGNOSIS — Z7189 Other specified counseling: Secondary | ICD-10-CM

## 2022-04-19 DIAGNOSIS — N138 Other obstructive and reflux uropathy: Secondary | ICD-10-CM

## 2022-04-19 LAB — POC URINALSYSI DIPSTICK (AUTOMATED)
Bilirubin, UA: NEGATIVE
Blood, UA: NEGATIVE
Glucose, UA: NEGATIVE
Ketones, UA: NEGATIVE
Leukocytes, UA: NEGATIVE
Nitrite, UA: NEGATIVE
Protein, UA: POSITIVE — AB
Spec Grav, UA: >=1.03 — AB (ref 1.010–1.025)
Urobilinogen, UA: 0.2 E.U./dL
pH, UA: 5 (ref 5.0–8.0)

## 2022-04-19 LAB — FRUCTOSAMINE: Fructosamine: 314 umol/L — ABNORMAL HIGH (ref 205–285)

## 2022-04-19 NOTE — Assessment & Plan Note (Signed)
Preventative protocols reviewed and updated unless pt declined. Discussed healthy diet and lifestyle.  

## 2022-04-19 NOTE — Assessment & Plan Note (Signed)
Advanced planning - thinks has living will at home but unsure. Asked to bring [place] a copy. Son Vimal Derego Kittler would be HCPOA. Would want to be resuscitated.

## 2022-04-19 NOTE — Progress Notes (Unsigned)
Patient ID: Anthony [name], male    DOB: 10-24-1945, 76 y.o.   MRN: [id number]  This visit was conducted in person.  BP 124/72   Pulse 78   Temp 97.8 F (36.6 C) (Temporal)   Ht 5' 10.5" (1.791 m)   Wt 224 lb 2 oz (101.7 kg)   SpO2 96%   BMI 31.70 kg/m    CC: CPE Subjective:   HPI: Anthony [name] is a 76 y.o. male presenting on 04/19/2022 for Annual Exam (MCR prt 2. )   Saw health advisor last week for medicare wellness visit. Note reviewed.  Positive screen for cognitive issues.    04/13/2022    2:14 PM  6CIT Screen  What Year? 0 points  What month? 0 points  What time? 0 points  Count back from 20 2 points  Months in reverse 4 points  Repeat phrase 2 points  Total Score 8 points   Today: MMSE: 24/30 CDT: 3/4  No results found.  Flowsheet Row Clinical Support from 04/13/2022 in [place] at [facility] Total Score 0          04/13/2022    2:09 PM 03/08/2021    2:08 PM 02/15/2021    1:08 PM 01/12/2020   12:03 PM 01/09/2019   12:20 PM  Fall Risk   Falls in the past year? 0 0  0 0  Comment   patient states unsure.  He states he does not remember.  He states he has had several incidents of low blood sugar with paramedic assistance.    Number falls in past yr: 0 0  0   Injury with Fall? 0 0  0   Risk for fall due to : Medication side effect   Medication side effect   Follow up Falls evaluation completed;Education provided;Falls prevention discussed   Falls evaluation completed;Falls prevention discussed    Planning L total knee replacement surgery under spinal anesthesia.   DM - on novolog 10-18u TID with meals, tresiba 32u daily, metformin XR 569m daily followed by endocrinology Dr GCruzita Lederer Regularly using DexCom G6 CGM. Ozempic 138mweekly stopped last month due to anorexia.   L knee replacement - saw Dr GhCruzita Ledererwas not cleared for surgery due to wide fluctuations in blood sugar range, plan was med changes (Metformin ER 5007maily,  ozempic 1mg85mekly, tresiba 32u daily, novolog 15 min before meals: 12u before small meal, 15u before regular meal, 18-20u before large meal, if sugar <90 before meal, take novolog at the start of the meal) and review CGM readings in 2 weeks to determine clearance. He has not contacted Dr GherCruzita Lederer this.   Now right knee is hurting.   CAC - sees cardiology Dr ChanGasper Sellst seen 07/2021, rec 1 yr f/u visit. He received preoperative cardiac clearance by cardiology.   Episode of gross hematuria several months ago, lasted 1 day then resolved, no recurrence since.   Preventative: COLONOSCOPY 01/2018 - TAx2, diverticulosis, int hemorrhoids, rpt 5 yrs ([name]). H/o Barrett's continues daily PPI  Prostate cancer screening - s/p TURP for BPH. Released from uro [place]. Normal voiding, check PSA yearly  Lung cancer screening CT - ~25PY hx over lifetime. Latest lung CT reviewed - LLL infiltrate, patient without symptoms, planned rpt CT 3 months to monitor this area.  Flu shot yearly  COV[place]021 x2, booster x1 ([place] 06/2011, 2018. Prevnar-13 05/2013 Tetanus - 05/2013 zostavax - 07/2014 Shingrix -  04/2017, 12/2017 Advanced planning - thinks has living will at home but unsure. Asked to bring [place] a copy. Son Gram Siedlecki Jolliff and daughter Ridwan Bondy would be HCPOA. Would want to be resuscitated. he needs to review what he has at home, ensure up to date.  Seat belt use discussed.  Sunscreen use discussed. No changing moles on skin.  Ex smoker quit 2013, 1/2 ppd for about 50 yrs  Alcohol - 1 drink/night, mixed drink 1 oz liquor Dentist - due - doesn't have dental  [facility] exam - yearly [facility], cataract surgery  Bowel - no constipation  Bladder - no incontinence    Owner/operater of dental lab-sold. Fully retired. Married 1969 2 daughters 1972, 1976; 1 son 75, 7 grandchildren Edu: 10th Grade Hobby: car restoration: has '66 Vette, two  classic Chevelle SS's and is restoring a '37 chevy coup Activity: no regular exercise Diet: good water, fruits/vegetables daily     Relevant past medical, surgical, family and social history reviewed and updated as indicated. Interim medical history since our last visit reviewed. Allergies and medications reviewed and updated. Outpatient Medications Prior to Visit  Medication Sig Dispense Refill   b complex vitamins tablet Take 1 tablet by mouth daily.     Continuous Blood Gluc Transmit (DEXCOM G6 TRANSMITTER) MISC 1 Device by Does not apply route every 3 (three) months. 1 each 3   Dextrose, Diabetic Use, (GLUCOSE PO) Take 1 tablet by mouth as needed.     glucose blood (ONETOUCH ULTRA) test strip USE AS DIRECTED FOUR TIMES DAILY 150 strip 3   GVOKE HYPOPEN 1-PACK 1 MG/0.2ML SOAJ Inject under skin 0.2 mL as needed for hypoglycemia 0.2 mL 11   insulin degludec (TRESIBA FLEXTOUCH) 200 UNIT/ML FlexTouch Pen INJECT 32U into SUBCUTANEOUSLY DAILY 27 mL 2   Insulin Pen Needle (ADVOCATE INSULIN PEN NEEDLES) 31G X 5 MM MISC Use 3x a day 300 each 3   metFORMIN (GLUCOPHAGE-XR) 500 MG 24 hr tablet Take 1 tablet (500 mg total) by mouth daily with breakfast. 360 tablet 0   NOVOLOG FLEXPEN 100 UNIT/ML FlexPen INJECT 10-18 UNITS into THE SKIN THREE TIMES DAILY with meals 60 mL 1   tadalafil (CIALIS) 20 MG tablet Take 1/2 to 1 tablet by mouth every other day as needed for erectile dysfunction 5 tablet 1   amLODipine (NORVASC) 5 MG tablet TAKE ONE TABLET BY MOUTH ONCE DAILY 90 tablet 0   blood glucose meter kit and supplies KIT Dispense based on patient and insurance preference. Use up to four times daily as directed. 1 each 0   olmesartan-hydrochlorothiazide (BENICAR HCT) 40-25 MG tablet TAKE ONE TABLET BY MOUTH ONCE DAILY 90 tablet 0   pantoprazole (PROTONIX) 40 MG tablet TAKE ONE TABLET BY MOUTH ONCE DAILY 90 tablet 0   simvastatin (ZOCOR) 20 MG tablet TAKE 1 TABLET BY MOUTH EVERYDAY AT BEDTIME 90 tablet 1    OZEMPIC, 1 MG/DOSE, 4 MG/3ML SOPN Inject 1 mg into the skin once a week. (Patient not taking: Reported on 04/19/2022) 9 mL 3   No facility-administered medications prior to visit.     Per HPI unless specifically indicated in ROS section below Review of Systems  Constitutional:  Negative for activity change, appetite change, chills, fatigue, fever and unexpected weight change.  HENT:  Negative for hearing loss.   Eyes:  Negative for visual disturbance.  Respiratory:  Negative for cough, chest tightness, shortness of breath and wheezing.   Cardiovascular:  Negative for chest pain,  palpitations and leg swelling.  Gastrointestinal:  Negative for abdominal distention, abdominal pain, blood in stool, constipation, diarrhea, nausea and vomiting.  Genitourinary:  Positive for hematuria (months ago x1). Negative for difficulty urinating.  Musculoskeletal:  Positive for arthralgias (knees). Negative for myalgias and neck pain.  Skin:  Negative for rash.  Neurological:  Negative for dizziness, seizures, syncope and headaches.       Unsteady due to knee pain  Hematological:  Negative for adenopathy. Does not bruise/bleed easily.  Psychiatric/Behavioral:  Negative for dysphoric mood. The patient is not nervous/anxious.     Objective:  BP 124/72   Pulse 78   Temp 97.8 F (36.6 C) (Temporal)   Ht 5' 10.5" (1.791 m)   Wt 224 lb 2 oz (101.7 kg)   SpO2 96%   BMI 31.70 kg/m   Wt Readings from Last 3 Encounters:  04/19/22 224 lb 2 oz (101.7 kg)  04/13/22 220 lb (99.8 kg)  03/06/22 220 lb 12.8 oz (100.2 kg)      Physical Exam Vitals and nursing note reviewed.  Constitutional:      General: He is not in acute distress.    Appearance: Normal appearance. He is well-developed. He is not ill-appearing.  HENT:     Head: Normocephalic and atraumatic.     Right Ear: Hearing, tympanic membrane, ear canal and external ear normal.     Left Ear: Hearing, tympanic membrane, ear canal and external ear  normal.  Eyes:     General: No scleral icterus.    Extraocular Movements: Extraocular movements intact.     Conjunctiva/sclera: Conjunctivae normal.     Pupils: Pupils are equal, round, and reactive to light.  Neck:     Thyroid: No thyroid mass or thyromegaly.  Cardiovascular:     Rate and Rhythm: Normal rate and regular rhythm.     Pulses: Normal pulses.          Radial pulses are 2+ on the right side and 2+ on the left side.     Heart sounds: Normal heart sounds. No murmur heard. Pulmonary:     Effort: Pulmonary effort is normal. No respiratory distress.     Breath sounds: Normal breath sounds. No wheezing, rhonchi or rales.  Abdominal:     General: Bowel sounds are normal. There is no distension.     Palpations: Abdomen is soft. There is no mass.     Tenderness: There is no abdominal tenderness. There is no guarding or rebound.     Hernia: No hernia is present.  Musculoskeletal:        General: Normal range of motion.     Cervical back: Normal range of motion and neck supple.     Right lower leg: No edema.     Left lower leg: No edema.  Lymphadenopathy:     Cervical: No cervical adenopathy.  Skin:    General: Skin is warm and dry.     Findings: No rash.  Neurological:     General: No focal deficit present.     Mental Status: He is alert and oriented to person, place, and time.  Psychiatric:        Mood and Affect: Mood normal.        Behavior: Behavior normal.        Thought Content: Thought content normal.        Judgment: Judgment normal.       Results for orders placed or performed in visit on 04/19/22  POCT  Urinalysis Dipstick (Automated)  Result Value Ref Range   Color, UA yellow    Clarity, UA clear    Glucose, UA Negative Negative   Bilirubin, UA negative    Ketones, UA negative    Spec Grav, UA >=1.030 (A) 1.010 - 1.025   Blood, UA negative    pH, UA 5.0 5.0 - 8.0   Protein, UA Positive (A) Negative   Urobilinogen, UA 0.2 0.2 or 1.0 E.U./dL   Nitrite,  UA negative    Leukocytes, UA Negative Negative    Assessment & Plan:   Problem List Items Addressed This Visit     Advanced care planning/counseling discussion (Chronic)    Advanced planning - thinks has living will at home but unsure. Asked to bring [place] a copy. Son Raj Landress Veldhuizen and daughter Donnel Venuto would be HCPOA. Would want to be resuscitated. he needs to review what he has at home, ensure up to date.       Health maintenance examination - Primary (Chronic)    Preventative protocols reviewed and updated unless pt declined. Discussed healthy diet and lifestyle.       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       Relevant Medications   simvastatin (ZOCOR) 20 MG tablet   olmesartan-hydrochlorothiazide (BENICAR HCT) 40-25 MG tablet   Hyperlipidemia associated with type 2 diabetes mellitus ([name])    Chronic, stable period on simvastatin 61m daily with LDL 64. The ASCVD Risk score (Arnett DK, et al., 2019) failed to calculate for the following reasons:   The valid total cholesterol range is 130 to 320 mg/dL       Relevant Medications   amLODipine (NORVASC) 5 MG tablet   simvastatin (ZOCOR) 20 MG tablet   olmesartan-hydrochlorothiazide (BENICAR HCT) 40-25 MG tablet   GERD    Stable period on daily pantoprazole       Relevant Medications   pantoprazole (PROTONIX) 40 MG tablet   Benign prostatic hyperplasia with urinary obstruction    S/p TURP. PSA stable.  No longer sees urology.       Barrett's esophagus    Continue daily protonix.       Ex-smoker    Quit 10 yrs ago.  Undergoing lung cancer screening CT - planned rpt 3 months as per above.       COPD (chronic obstructive pulmonary disease) ([name])    Stable period off respiratory medications.       Coronary artery calcification seen on CAT scan    Followed by cardiology on statin.       Relevant Medications    amLODipine (NORVASC) 5 MG tablet   simvastatin (ZOCOR) 20 MG tablet   olmesartan-hydrochlorothiazide (BENICAR HCT) 40-25 MG tablet   Aortic atherosclerosis ([name])    Continue statin.       Relevant Medications   amLODipine (NORVASC) 5 MG tablet   simvastatin (ZOCOR) 20 MG tablet   olmesartan-hydrochlorothiazide (BENICAR HCT) 40-25 MG tablet   MCI (mild cognitive impairment) with memory loss    MMSE today 24/30 consistent with mild memory difficulties, predominantly noted in orientation, concentration and recall, anticipate MCI with memory loss. Discussed with patient, as well as provided with list of strategies for healthy mind. RTC 3 mo f/u visit, consider memory labwork, consider starting medication. He cares for wife with worse memory difficulty.  Anticipate brittle diabetes contributes to memory trouble.  Type 2 diabetes mellitus with hypoglycemia ([name])    Chronic brittle diabetes with fluctuating sugar levels.  Pending endo clearance for orthopedic surgery as per below.  I encouraged he contact Dr Cruzita Lederer.       Relevant Medications   simvastatin (ZOCOR) 20 MG tablet   olmesartan-hydrochlorothiazide (BENICAR HCT) 40-25 MG tablet   Primary osteoarthritis of left knee    See above.       Pre-operative exam    On previous evaluation, RCRI = 1 (0.6% 30d perioperative morbidity/mortality risk).  Received cardiac clearance by cardiologist.  Did not receive diabetes clearance by endocrinologist - plan was med changes and review cbg's in 2 wks - he needs to contact endo for glycemic review prior to ortho surgery.       Gross hematuria    Episode of gross hematuria occurred several months ago. Will update UA today - clear.       Relevant Orders   POCT Urinalysis Dipstick (Automated) (Completed)     Meds ordered this encounter  Medications   amLODipine (NORVASC) 5 MG tablet    Sig: Take 1 tablet (5 mg total) by mouth daily.    Dispense:  90 tablet    Refill:  3    simvastatin (ZOCOR) 20 MG tablet    Sig: TAKE 1 TABLET BY MOUTH EVERYDAY AT BEDTIME    Dispense:  90 tablet    Refill:  3   pantoprazole (PROTONIX) 40 MG tablet    Sig: Take 1 tablet (40 mg total) by mouth daily.    Dispense:  90 tablet    Refill:  3   olmesartan-hydrochlorothiazide (BENICAR HCT) 40-25 MG tablet    Sig: Take 1 tablet by mouth daily.    Dispense:  90 tablet    Refill:  3   Orders Placed This Encounter  Procedures   POCT Urinalysis Dipstick (Automated)    Patient instructions: Urinalysis today  Call Dr Cruzita Lederer or check with orthopedic office to review sugar levels if you want to proceed with knee surgery.  Bring [place] copy of your advanced directive to update your chart. Ensure you have an updated copy at home, check with your children.  Good to see you today Return in 3 months for follow up visit on memory   4 core lifestyle strategies to support a healthy mind: 1. Nutritious well balance diet.  2. Regular physical activity routine.  3. Regular mental activity such as reading books, word puzzles, math puzzles, jigsaw puzzles.  4. Social engagement.  Also ensure good blood pressure control, limit alcohol, no smoking.    Follow up plan: Return in about 3 months (around 07/20/2022), or if symptoms worsen or fail to improve, for follow up visit.  [name], MD

## 2022-04-19 NOTE — Patient Instructions (Addendum)
Urinalysis today  Call Dr Cruzita Lederer or check with orthopedic office to review sugar levels if you want to proceed with knee surgery.  Bring [place] copy of your advanced directive to update your chart. Ensure you have an updated copy at home, check with your children.  Good to see you today Return in 3 months for follow up visit on memory   4 core lifestyle strategies to support a healthy mind: 1. Nutritious well balance diet.  2. Regular physical activity routine.  3. Regular mental activity such as reading books, word puzzles, math puzzles, jigsaw puzzles.  4. Social engagement.  Also ensure good blood pressure control, limit alcohol, no smoking.    Health Maintenance After Age 60 After age 50, you are at a higher risk for certain long-term diseases and infections as well as injuries from falls. Falls are a major cause of broken bones and head injuries in people who are older than age 34. Getting regular preventive care can help to keep you healthy and well. Preventive care includes getting regular testing and making lifestyle changes as recommended by your health care provider. Talk with your health care provider about: Which screenings and tests you should have. A screening is a test that checks for a disease when you have no symptoms. A diet and exercise plan that is right for you. What should I know about screenings and tests to prevent falls? Screening and testing are the best ways to find a health problem early. Early diagnosis and treatment give you the best chance of managing medical conditions that are common after age 63. Certain conditions and lifestyle choices may make you more likely to have a fall. Your health care provider may recommend: Regular vision checks. Poor vision and conditions such as cataracts can make you more likely to have a fall. If you wear glasses, make sure to get your prescription updated if your vision changes. Medicine review. Work with your health care provider to  regularly review all of the medicines you are taking, including over-the-counter medicines. Ask your health care provider about any side effects that may make you more likely to have a fall. Tell your health care provider if any medicines that you take make you feel dizzy or sleepy. Strength and balance checks. Your health care provider may recommend certain tests to check your strength and balance while standing, walking, or changing positions. Foot health exam. Foot pain and numbness, as well as not wearing proper footwear, can make you more likely to have a fall. Screenings, including: Osteoporosis screening. Osteoporosis is a condition that causes the bones to get weaker and break more easily. Blood pressure screening. Blood pressure changes and medicines to control blood pressure can make you feel dizzy. Depression screening. You may be more likely to have a fall if you have a fear of falling, feel depressed, or feel unable to do activities that you used to do. Alcohol use screening. Using too much alcohol can affect your balance and may make you more likely to have a fall. Follow these instructions at home: Lifestyle Do not drink alcohol if: Your health care provider tells you not to drink. If you drink alcohol: Limit how much you have to: 0-1 drink a day for women. 0-2 drinks a day for men. Know how much alcohol is in your drink. In the [place] one drink equals one 12 oz bottle of beer (355 mL), one 5 oz glass of wine (148 mL), or one 1 oz glass of hard  liquor (44 mL). Do not use any products that contain nicotine or tobacco. These products include cigarettes, chewing tobacco, and vaping devices, such as e-cigarettes. If you need help quitting, ask your health care provider. Activity  Follow a regular exercise program to stay fit. This will help you maintain your balance. Ask your health care provider what types of exercise are appropriate for you. If you need a cane or walker, use it as  recommended by your health care provider. Wear supportive shoes that have nonskid soles. Safety  Remove any tripping hazards, such as rugs, cords, and clutter. Install safety equipment such as grab bars in bathrooms and safety rails on stairs. Keep rooms and walkways well-lit. General instructions Talk with your health care provider about your risks for falling. Tell your health care provider if: You fall. Be sure to tell your health care provider about all falls, even ones that seem minor. You feel dizzy, tiredness (fatigue), or off-balance. Take over-the-counter and prescription medicines only as told by your health care provider. These include supplements. Eat a healthy diet and maintain a healthy weight. A healthy diet includes low-fat dairy products, low-fat (lean) meats, and fiber from whole grains, beans, and lots of fruits and vegetables. Stay current with your vaccines. Schedule regular health, dental, and eye exams. Summary Having a healthy lifestyle and getting preventive care can help to protect your health and wellness after age 42. Screening and testing are the best way to find a health problem early and help you avoid having a fall. Early diagnosis and treatment give you the best chance for managing medical conditions that are more common for people who are older than age 27. Falls are a major cause of broken bones and head injuries in people who are older than age 46. Take precautions to prevent a fall at home. Work with your health care provider to learn what changes you can make to improve your health and wellness and to prevent falls. This information is not intended to replace advice given to you by your health care provider. Make sure you discuss any questions you have with your health care provider. Document Revised: 01/03/2021 Document Reviewed: 01/03/2021 Elsevier Patient Education  [place].

## 2022-04-20 MED ORDER — AMLODIPINE BESYLATE 5 MG PO TABS: 5.0000 mg | ORAL_TABLET | Freq: Every day | ORAL | 3 refills | Status: DC

## 2022-04-20 MED ORDER — PANTOPRAZOLE SODIUM 40 MG PO TBEC: 40.0000 mg | DELAYED_RELEASE_TABLET | Freq: Every day | ORAL | 3 refills | Status: DC

## 2022-04-20 MED ORDER — SIMVASTATIN 20 MG PO TABS: ORAL_TABLET | ORAL | 3 refills | Status: DC

## 2022-04-20 MED ORDER — OLMESARTAN MEDOXOMIL-HCTZ 40-25 MG PO TABS: 1.0000 | ORAL_TABLET | Freq: Every day | ORAL | 3 refills | Status: DC

## 2022-04-20 NOTE — Assessment & Plan Note (Signed)
Chronic brittle diabetes with fluctuating sugar levels.  Pending endo clearance for orthopedic surgery as per below.  I encouraged he contact Dr Cruzita Lederer.

## 2022-04-20 NOTE — Assessment & Plan Note (Signed)
On previous evaluation, RCRI = 1 (0.6% 30d perioperative morbidity/mortality risk).  Received cardiac clearance by cardiologist.  Did not receive diabetes clearance by endocrinologist - plan was med changes and review cbg's in 2 wks - he needs to contact endo for glycemic review prior to ortho surgery.

## 2022-04-20 NOTE — Assessment & Plan Note (Addendum)
Chronic, stable period on simvastatin '20mg'$  daily with LDL 64. The ASCVD Risk score (Arnett DK, et al., 2019) failed to calculate for the following reasons:   The valid total cholesterol range is 130 to 320 mg/dL

## 2022-04-20 NOTE — Assessment & Plan Note (Signed)
Quit 10 yrs ago.  Undergoing lung cancer screening CT - planned rpt 3 months as per above.

## 2022-04-20 NOTE — Assessment & Plan Note (Signed)
S/p TURP. PSA stable.  No longer sees urology.

## 2022-04-20 NOTE — Assessment & Plan Note (Signed)
Stable period on daily pantoprazole

## 2022-04-20 NOTE — Assessment & Plan Note (Signed)
Continue daily protonix.

## 2022-04-20 NOTE — Assessment & Plan Note (Addendum)
Followed by cardiology on statin.

## 2022-04-20 NOTE — Assessment & Plan Note (Signed)
Regularly sees eye doctor, latest 08/2021.

## 2022-04-20 NOTE — Assessment & Plan Note (Signed)
Stable period off respiratory medications.

## 2022-04-20 NOTE — Assessment & Plan Note (Signed)
See above

## 2022-04-20 NOTE — Assessment & Plan Note (Addendum)
Episode of gross hematuria occurred several months ago. Will update UA today - clear.

## 2022-04-20 NOTE — Assessment & Plan Note (Addendum)
MMSE today 24/30 consistent with mild memory difficulties, predominantly noted in orientation, concentration and recall, anticipate MCI with memory loss. Discussed with patient, as well as provided with list of strategies for healthy mind. RTC 3 mo f/u visit, consider memory labwork, consider starting medication. He cares for wife with worse memory difficulty.  Anticipate brittle diabetes contributes to memory trouble.

## 2022-04-20 NOTE — Assessment & Plan Note (Signed)
Continue statin. 

## 2022-04-25 DIAGNOSIS — E113291 Type 2 diabetes mellitus with mild nonproliferative diabetic retinopathy without macular edema, right eye: Secondary | ICD-10-CM | POA: Diagnosis not present

## 2022-04-25 DIAGNOSIS — Z794 Long term (current) use of insulin: Secondary | ICD-10-CM | POA: Diagnosis not present

## 2022-04-26 ENCOUNTER — Ambulatory Visit: Payer: Self-pay

## 2022-04-26 NOTE — Patient Instructions (Signed)
Visit Information  Thank you for taking time to visit with me today. Please don't hesitate to contact me if I can be of assistance to you.   Following are the goals we discussed today:   Goals Addressed             This Visit's Progress    Patient Stated: Controlling my blood sugars       Care Coordination Interventions: Reviewed medications with patient and discussed importance of medication adherence Discussed plans with patient for ongoing care management follow up and provided patient with direct contact information for care management team Reviewed scheduled/upcoming provider appointments  Patient advised to call endocrinologist office to report ongoing low hs  blood sugars Reviewed Rule of 15 hypoglycemic treatment/ management          Our next appointment is by telephone on 05/10/22 at 1:30pm  Please call the care guide team at [phone] if you need to cancel or reschedule your appointment.   If you are experiencing a Mental Health or [place] or need someone to talk to, please call 1-800-273-TALK (toll free, 24 hour hotline)  Patient verbalizes understanding of instructions and care plan provided today and agrees to view in [place]. Active MyChart status and patient understanding of how to access instructions and care plan via MyChart confirmed with patient.     Quinn Plowman RN,BSN,CCM RN Care Manager Coordinator [phone]

## 2022-04-26 NOTE — Patient Outreach (Signed)
  Care Coordination   Follow Up Visit Note   04/26/2022 Name: Anthony [name] MRN: [id number] DOB: Jul 25, 1946  [name] is a 76 y.o. year old male who sees [name], MD for primary care. I spoke with  [name] by phone today.  What matters to the patients health and wellness today?  Patient states he is having 3-4 low blood sugars per week during there middle of the night. He states he has a hard time bringing the blood sugars up when he treats them.  He states he uses glucose tablets and/ or glucose liquid. He states he recently ordered protein drinks that he takes after treating his low blood sugars which seems to help his blood sugar stay up after treatment.     Goals Addressed             This Visit's Progress    Patient Stated: Controlling my blood sugars       Care Coordination Interventions: Reviewed medications with patient and discussed importance of medication adherence Discussed plans with patient for ongoing care management follow up and provided patient with direct contact information for care management team Reviewed scheduled/upcoming provider appointments  Patient advised to call endocrinologist office to report ongoing low hs  blood sugars Reviewed Rule of 15 hypoglycemic treatment/ management          SDOH assessments and interventions completed:  No     Care Coordination Interventions Activated:  Yes  Care Coordination Interventions:  Yes, provided   Follow up plan: Follow up call scheduled for 05/10/22 at 1:30 pm    Encounter Outcome:  Pt. Visit Completed   Quinn Plowman RN,BSN,CCM [place] Coordinator [phone]

## 2022-04-27 ENCOUNTER — Telehealth: Payer: Self-pay

## 2022-04-27 NOTE — Progress Notes (Signed)
ERROR

## 2022-05-02 ENCOUNTER — Telehealth: Payer: Self-pay

## 2022-05-02 NOTE — Progress Notes (Signed)
Chronic Care Management Pharmacy Assistant   Name: Anthony [name]  MRN: [id number] DOB: 1945/09/15  Reason for Encounter: CCM (Medication Adherence and Delivery Coordination)  Recent office visits:  04/19/22 [name], MD Annual Exam abnormal "Plz notify UA returned negative for blood but very concentrated. Encouraged increase water by 1-2 glasses a day, and let us know if any recurrent blood in urine develops"  No med changes. FU in 3 months  04/13/22 AWV  Recent consult visits:  None since last CCM contact  Hospital visits:  None in previous 6 months  Medications: Outpatient Encounter Medications as of 05/02/2022  Medication Sig   amLODipine (NORVASC) 5 MG tablet Take 1 tablet (5 mg total) by mouth daily.   b complex vitamins tablet Take 1 tablet by mouth daily.   Continuous Blood Gluc Transmit (DEXCOM G6 TRANSMITTER) MISC 1 Device by Does not apply route every 3 (three) months.   Dextrose, Diabetic Use, (GLUCOSE PO) Take 1 tablet by mouth as needed.   glucose blood (ONETOUCH ULTRA) test strip USE AS DIRECTED FOUR TIMES DAILY   GVOKE HYPOPEN 1-PACK 1 MG/0.2ML SOAJ Inject under skin 0.2 mL as needed for hypoglycemia   insulin degludec (TRESIBA FLEXTOUCH) 200 UNIT/ML FlexTouch Pen INJECT 32U into SUBCUTANEOUSLY DAILY   Insulin Pen Needle (ADVOCATE INSULIN PEN NEEDLES) 31G X 5 MM MISC Use 3x a day   metFORMIN (GLUCOPHAGE-XR) 500 MG 24 hr tablet Take 1 tablet (500 mg total) by mouth daily with breakfast.   NOVOLOG FLEXPEN 100 UNIT/ML FlexPen INJECT 10-18 UNITS into THE SKIN THREE TIMES DAILY with meals   olmesartan-hydrochlorothiazide (BENICAR HCT) 40-25 MG tablet Take 1 tablet by mouth daily.   OZEMPIC, 1 MG/DOSE, 4 MG/3ML SOPN Inject 1 mg into the skin once a week. (Patient not taking: Reported on 04/19/2022)   pantoprazole (PROTONIX) 40 MG tablet Take 1 tablet (40 mg total) by mouth daily.   simvastatin (ZOCOR) 20 MG tablet TAKE 1 TABLET BY MOUTH EVERYDAY AT BEDTIME    tadalafil (CIALIS) 20 MG tablet Take 1/2 to 1 tablet by mouth every other day as needed for erectile dysfunction   No facility-administered encounter medications on file as of 05/02/2022.   BP Readings from Last 3 Encounters:  04/19/22 124/72  03/06/22 138/70  03/02/22 130/82    Lab Results  Component Value Date   HGBA1C 7.3 (A) 03/02/2022    Recent OV, Consult or Hospital visit:  No medication changes indicated  Last adherence delivery date: 04/13/2022      Patient is due for next adherence delivery on: 05/15/2022  Spoke with patient on 05/05/2022 reviewed medications and coordinated delivery.  VIAL medications: Simvastatin 20 mg Take 1 tablet every day at bedtime OneTouch Test strips - Use up to four times daily Tadalafil 20 mg - 1/2 to 1 tablet daily as needed- PRN use Olmesartan/HCTZ 40-25 mg Take 1 tablet by mouth every day Amlodipine 5 mg 1 tablet daily Pantoprazole 40 mg 1 tablet daily Novolog Flexpen Inject 10-18 units into the skin 3 times daily with meals Tresiba Flex 200 units- 32 units once daily in morning    Patient declined the following medications this month: Metformin 500 mg ER - take 1 tablet every morning (pt has excess supply as RX was for 3 a day now takes 1) Ozempic 1 mg/dose - Inject once weekly - has 2 boxes on hand Gvoke Hypopen - 1 mg - Inject 0.2 mL as needed - has 3 on hand  Any concerns about your medications? No   How often do you forget or accidentally miss a dose? Never   Do you use a pillbox? No   Is patient in packaging No  Refills requested from providers include: Not completed Tadalafil 20 mg - 1/2 to 1 tablet daily as needed- PRN use  Confirmed delivery date of 05/15/2022, advised patient that pharmacy will contact them the morning of delivery.  Recent blood pressure readings are as follows: Patient does not take blood pressure at home.    Recent blood glucose readings are as follows: Patient was not at home for readings.     Annual wellness visit in last year? Yes 07/26/2021 Most Recent BP reading: 124/72 on 04/19/2022   If Diabetic: Most recent A1C reading: 7.3 on 03/02/2022 Last eye exam / retinopathy screening: Up to date Last diabetic foot exam: Up to date  Cycle dispensing form sent to [facility], CPP for review.

## 2022-05-05 ENCOUNTER — Telehealth: Payer: Self-pay | Admitting: *Deleted

## 2022-05-05 NOTE — Chronic Care Management (AMB) (Signed)
  Care Coordination  Outreach Note  05/05/2022 Name: Anthony [name] MRN: [id number] DOB: 07-15-1946   Care Coordination Outreach Attempts: An unsuccessful telephone outreach was attempted for a scheduled appointment today.  Follow Up Plan:  Additional outreach attempts will be made to offer the patient care coordination information and services.   Encounter Outcome:  No Answer  [place]  Direct Dial: [phone]

## 2022-05-11 NOTE — Chronic Care Management (AMB) (Signed)
  Care Coordination   Note   05/11/2022 Name: Anthony [name] MRN: [id number] DOB: 10-30-1945  Anthony [name] is a 76 y.o. year old male who sees [name], MD for primary care. I reached out to [name] by phone today to offer care coordination services.   Follow up plan:  Telephone appointment with care coordination team member scheduled for:  05/12/22  Encounter Outcome:  Pt. Scheduled  [place]  Direct Dial: [phone]

## 2022-05-12 ENCOUNTER — Telehealth: Payer: Self-pay

## 2022-05-12 NOTE — Patient Instructions (Signed)
Visit Information  Thank you for taking time to visit with me today. Please don't hesitate to contact me if I can be of assistance to you.   Following are the goals we discussed today:   Goals Addressed             This Visit's Progress    Patient Stated: Controlling my blood sugars       Care Coordination Interventions: Reviewed medications with patient and discussed importance of medication adherence Patient advised to use manual glucometer to check blood sugars while waiting on sensors to be mailed  Patient advised to discuss with primary provider 2nd opinion endocrinology option Discussed better food options when going to favorite fast food restaurants [facility]- choose to eat a salad with favorite lunch meat choices,  [employer]- choose chicken nuggets and side salad instead of quarter pounder with cheese meal.  Reviewed scheduled/upcoming provider appointments  Patient re-advised to call endocrinologist office to report ongoing low hs  blood sugars Re-reviewed Rule of 15 hypoglycemic treatment/ management          Our next appointment is by telephone on 07/06/22 at 2 pm  Please call the care guide team at [phone] if you need to cancel or reschedule your appointment.   If you are experiencing a Mental Health or [place] or need someone to talk to, please call 1-800-273-TALK (toll free, 24 hour hotline)  Patient verbalizes understanding of instructions and care plan provided today and agrees to view in [place]. Active MyChart status and patient understanding of how to access instructions and care plan via MyChart confirmed with patient.     Quinn Plowman RN,BSN,CCM [place] Coordination [phone] direct line

## 2022-05-12 NOTE — Patient Outreach (Signed)
  Care Coordination   Follow Up Visit Note   05/12/2022 Name: Anthony [name] MRN: [id number] DOB: 14-Sep-1945  Anthony [name] is a 76 y.o. year old male who sees [name], MD for primary care. I spoke with  [name] by phone today.  What matters to the patients health and wellness today?  Ongoing low blood sugar drops and making better food choices to help with management of diabetes.     Goals Addressed             This Visit's Progress    Patient Stated: Controlling my blood sugars       Care Coordination Interventions: Reviewed medications with patient and discussed importance of medication adherence Patient advised to use manual glucometer to check blood sugars while waiting on sensors to be mailed  Patient advised to discuss with primary provider 2nd opinion endocrinology option Discussed better food options when going to favorite fast food restaurants [facility]- choose to eat a salad with favorite lunch meat choices,  [employer]- choose chicken nuggets and side salad instead of quarter pounder with cheese meal.  Reviewed scheduled/upcoming provider appointments  Patient re-advised to call endocrinologist office to report ongoing low hs  blood sugars Re-reviewed Rule of 15 hypoglycemic treatment/ management          SDOH assessments and interventions completed:  No     Care Coordination Interventions Activated:  Yes  Care Coordination Interventions:  Yes, provided   Follow up plan: Follow up call scheduled for 07/06/22 at 2 pm    Encounter Outcome:  Pt. Visit Completed   Quinn Plowman RN,BSN,CCM [place] [phone] direct line

## 2022-05-31 ENCOUNTER — Other Ambulatory Visit: Payer: Self-pay | Admitting: Family Medicine

## 2022-05-31 DIAGNOSIS — N529 Male erectile dysfunction, unspecified: Secondary | ICD-10-CM

## 2022-06-01 ENCOUNTER — Telehealth: Payer: Self-pay

## 2022-06-01 NOTE — Chronic Care Management (AMB) (Addendum)
Chronic Care Management Pharmacy Assistant   Name: KAVAN DEVAN  MRN: [id number] DOB: 1946/08/24   Reason for Encounter: Medication Adherence and Delivery Coordination   Recent office visits:  None since last CCM contact  Recent consult visits:  None since last CCM contact  Hospital visits:  None in previous 6 months  Medications: Outpatient Encounter Medications as of 06/01/2022  Medication Sig   amLODipine (NORVASC) 5 MG tablet Take 1 tablet (5 mg total) by mouth daily.   b complex vitamins tablet Take 1 tablet by mouth daily.   Continuous Blood Gluc Transmit (DEXCOM G6 TRANSMITTER) MISC 1 Device by Does not apply route every 3 (three) months.   Dextrose, Diabetic Use, (GLUCOSE PO) Take 1 tablet by mouth as needed.   glucose blood (ONETOUCH ULTRA) test strip USE AS DIRECTED FOUR TIMES DAILY   GVOKE HYPOPEN 1-PACK 1 MG/0.2ML SOAJ Inject under skin 0.2 mL as needed for hypoglycemia   insulin degludec (TRESIBA FLEXTOUCH) 200 UNIT/ML FlexTouch Pen INJECT 32U into SUBCUTANEOUSLY DAILY   Insulin Pen Needle (ADVOCATE INSULIN PEN NEEDLES) 31G X 5 MM MISC Use 3x a day   metFORMIN (GLUCOPHAGE-XR) 500 MG 24 hr tablet Take 1 tablet (500 mg total) by mouth daily with breakfast.   NOVOLOG FLEXPEN 100 UNIT/ML FlexPen INJECT 10-18 UNITS into THE SKIN THREE TIMES DAILY with meals   olmesartan-hydrochlorothiazide (BENICAR HCT) 40-25 MG tablet Take 1 tablet by mouth daily.   OZEMPIC, 1 MG/DOSE, 4 MG/3ML SOPN Inject 1 mg into the skin once a week. (Patient not taking: Reported on 04/19/2022)   pantoprazole (PROTONIX) 40 MG tablet Take 1 tablet (40 mg total) by mouth daily.   simvastatin (ZOCOR) 20 MG tablet TAKE 1 TABLET BY MOUTH EVERYDAY AT BEDTIME   tadalafil (CIALIS) 20 MG tablet TAKE 1/2 TO 1 TABLET BY MOUTH every other DAY AS NEEDED FOR erectile dysfunction Needs annual physical for additional refills   No facility-administered encounter medications on file as of 06/01/2022.    BP  Readings from Last 3 Encounters:  04/19/22 124/72  03/06/22 138/70  03/02/22 130/82    Lab Results  Component Value Date   HGBA1C 7.3 (A) 03/02/2022      No OVs, Consults, or hospital visits since last care coordination call / Pharmacist visit. No medication changes indicated   Last adherence delivery date:05/15/22      Patient is due for next adherence delivery on: 06/13/22  Spoke with patient on 06/01/22 reviewed medications and coordinated delivery.  This delivery to include: Vials  30 Days  No safety caps VIAL medications: Simvastatin 20 mg Take 1 tablet every day at bedtime OneTouch Test strips - Use up to four times daily Tadalafil 20 mg - 1/2 to 1 tablet daily as needed- PRN use Olmesartan/HCTZ 40-25 mg Take 1 tablet by mouth every day Amlodipine 5 mg 1 tablet daily Pantoprazole 40 mg 1 tablet daily Novolog Flexpen Inject 10-18 units into the skin 3 times daily with meals Tresiba Flex 200 units- 32 units once daily in morning    Patient declined the following medications this month: Metformin 500 mg ER - take 1 tablet every morning (pt has excess supply as RX was for 3 a day now takes 1) Ozempic 1 mg/dose - Inject once weekly - not going to take any more Gvoke Hypopen - 1 mg - Inject 0.2 mL as needed   Any concerns about your medications?  The patient reports he wont take Ozempic anylonger due to scared  of side effects  How often do you forget or accidentally miss a dose? Once a week The patient has decided not to take ozempic any longer due to side effects he read about  Do you use a pillbox? No  Is patient in packaging No   Refills requested from providers include:Tadalafil,tresiba   /Confirmed delivery date of 06/13/22, advised patient that pharmacy will contact them the morning of delivery.  Recent blood pressure readings are as follows:none available    Recent blood glucose readings are as follows: Fasting:  averaging 200's    Annual wellness visit  in last year? Yes Most Recent BP reading:124/72  If Diabetic: Most recent A1C reading:7.3  03/02/22 Last eye exam / retinopathy screening:UTD Last diabetic foot exam:UTD  Cycle dispensing form sent to [name], PTM for review. Sent to [facility] due to no changes from last month .  Charlene Brooke, CPP notified   no CCM appt.    07/24/22- PCP  [place], [place]  [phone]

## 2022-06-05 ENCOUNTER — Other Ambulatory Visit: Payer: Self-pay | Admitting: Internal Medicine

## 2022-06-05 NOTE — Telephone Encounter (Signed)
Contacted the patient and scheduled appointment to discuss medications with CPP  Charlene Brooke, CPP notified  Avel Sensor, [place]  [phone]

## 2022-06-06 DIAGNOSIS — Z794 Long term (current) use of insulin: Secondary | ICD-10-CM | POA: Diagnosis not present

## 2022-06-06 DIAGNOSIS — E113291 Type 2 diabetes mellitus with mild nonproliferative diabetic retinopathy without macular edema, right eye: Secondary | ICD-10-CM | POA: Diagnosis not present

## 2022-06-08 DIAGNOSIS — E559 Vitamin D deficiency, unspecified: Secondary | ICD-10-CM | POA: Diagnosis not present

## 2022-06-19 ENCOUNTER — Telehealth: Payer: Self-pay

## 2022-06-19 NOTE — Progress Notes (Signed)
    Chronic Care Management Pharmacy Assistant   Name: Anthony [name]  MRN: [id number] DOB: [birth year]  Reason for Encounter: CCM (Appointment Reminder)  Medications: Outpatient Encounter Medications as of 06/19/2022  Medication Sig Note   amLODipine (NORVASC) 5 MG tablet Take 1 tablet (5 mg total) by mouth daily.    b complex vitamins tablet Take 1 tablet by mouth daily.    Continuous Blood Gluc Transmit (DEXCOM G6 TRANSMITTER) MISC 1 Device by Does not apply route every 3 (three) months.    Dextrose, Diabetic Use, (GLUCOSE PO) Take 1 tablet by mouth as needed.    glucose blood (ONETOUCH ULTRA) test strip USE AS DIRECTED FOUR TIMES DAILY    GVOKE HYPOPEN 1-PACK 1 MG/0.2ML SOAJ Inject under skin 0.2 mL as needed for hypoglycemia    insulin degludec (TRESIBA FLEXTOUCH) 200 UNIT/ML FlexTouch Pen inject 32 units into THE SKIN DAILY    Insulin Pen Needle (ADVOCATE INSULIN PEN NEEDLES) 31G X 5 MM MISC Use 3x a day    metFORMIN (GLUCOPHAGE-XR) 500 MG 24 hr tablet Take 1 tablet (500 mg total) by mouth daily with breakfast.    NOVOLOG FLEXPEN 100 UNIT/ML FlexPen INJECT 10-18 UNITS into THE SKIN THREE TIMES DAILY with meals    olmesartan-hydrochlorothiazide (BENICAR HCT) 40-25 MG tablet Take 1 tablet by mouth daily.    OZEMPIC, 1 MG/DOSE, 4 MG/3ML SOPN Inject 1 mg into the skin once a week. (Patient not taking: Reported on 04/19/2022) 06/01/2022: Last fill date 12/2021 30 ds   pantoprazole (PROTONIX) 40 MG tablet Take 1 tablet (40 mg total) by mouth daily.    simvastatin (ZOCOR) 20 MG tablet TAKE 1 TABLET BY MOUTH EVERYDAY AT BEDTIME    tadalafil (CIALIS) 20 MG tablet TAKE 1/2 TO 1 TABLET BY MOUTH every other DAY AS NEEDED FOR erectile dysfunction Needs annual physical for additional refills    No facility-administered encounter medications on file as of 06/19/2022.   [name] was contacted to remind of upcoming telephone visit with Charlene Brooke on 06/22/2022 at 11:45. Patient was  reminded to have any blood glucose and blood pressure readings available for review at appointment.   Message was left reminding patient of appointment.  CCM referral has been placed prior to visit?  Yes   Star Rating Drugs: Medication:    Last Fill: Day Supply Metformin 500 mg   Excess asdosage was decreased  Olmesartan-HCTZ 40-25 mg  06/08/22 30 Ozempic 1 mg    No longer taking Simvastatin 20 mg   06/08/22 [place], CPP notified  [name], [place] [facility] Assistant [phone]

## 2022-06-22 ENCOUNTER — Ambulatory Visit: Payer: Medicare HMO | Admitting: Pharmacist

## 2022-06-22 DIAGNOSIS — I700 Atherosclerosis of aorta: Secondary | ICD-10-CM

## 2022-06-22 DIAGNOSIS — E1169 Type 2 diabetes mellitus with other specified complication: Secondary | ICD-10-CM

## 2022-06-22 DIAGNOSIS — I10 Essential (primary) hypertension: Secondary | ICD-10-CM

## 2022-06-22 DIAGNOSIS — E113299 Type 2 diabetes mellitus with mild nonproliferative diabetic retinopathy without macular edema, unspecified eye: Secondary | ICD-10-CM

## 2022-06-22 NOTE — Patient Instructions (Signed)
Visit Information  Phone number for Pharmacist: [phone]   Goals Addressed   None     Care Plan : Anthony [name]  Updates made by Charlton Haws, [place] since 06/22/2022 12:00 AM     Problem: Hypertension, Hyperlipidemia, Diabetes, and Coronary Artery Disease   Priority: High     Long-Range Goal: Disease Management   Start Date: 11/23/2020  Expected End Date: 06/23/2023  This Visit's Progress: On track  Recent Progress: On track  Priority: High  Note:   Current Barriers:  Unable to achieve control of diabetes Did not discuss medication concerns with prescriber  Pharmacist Clinical Goal(s):  Patient will adhere to plan to optimize therapeutic regimen for DM as evidenced by report of adherence to recommended medication management changes contact provider office for questions/concerns as evidenced notation of same in electronic health record through collaboration with PharmD and provider.   Interventions: 1:1 collaboration with [name], MD regarding development and update of comprehensive plan of care as evidenced by provider attestation and co-signature Inter-disciplinary care team collaboration (see longitudinal plan of care) Comprehensive medication review performed; medication list updated in electronic medical record  Diabetes (A1c goal <8%) -Not ideally controlled - A1c 7.3% (02/2022), A1c has not been accurate in comparison to CGM data which shows worse control; he stopped Ozempic a few months ago due to fear of side effects after seeing media reports about it; he is trying to get cleared to have knee surgery but poor BG control has prevented this so far -Follows with endocrine (Dr Cruzita Lederer); hx severe hypoglycemia and memory impairment -DM dx 2006, insulin since 2008 -Current medications: Metformin 500 mg daily AM -  Appropriate, Effective, Safe, Accessible Novolog 08-11-17 u w meals - Appropriate, Query Effective Tresiba U200 32 units daily -  Appropriate, Query Effective Ozempic 1 mg weekly -Appropriate, Query Effective Gvoke Hypopen PRN - not used Dexcom G7 -Appropriate, Effective, Safe, Accessible -Medications previously tried: upset stomach on higher dose metformin  -Current meal patterns: eats 2 meals/day (late breakfast ~11 AM, early dinner ~ 5 PM) -Current exercise: tries to walk when he can -Educated on A1c and blood sugar goals; consequences of high BG on wound healing/surgery outcomes -Reviewed efficacy/safety data for Ozempic, overwhelmingly benefits outweigh risks; he has not had side effects himself (other than appetite suppression) -Advised pt to restart Ozempic (he has 2 pens) and f/u with Dr Cruzita Lederer as scheduled  Hypertension (BP goal <140/90) -Controlled - per OV readings -Current home readings: n/a; Denies hypotensive/hypertensive symptoms -Current treatment: Amlodipine 5 mg daily - Appropriate, Effective, Safe, Accessible Olmesartan-HCTZ 40-25 mg daily -Appropriate, Effective, Safe, Accessible -Medications previously tried: n/a  -Educated on BP goals and benefits of medications for prevention of heart attack, stroke and kidney damage; -Counseled to monitor BP at home periodically -Recommended to continue current medication  Hyperlipidemia: (LDL goal < 70) -Controlled - LDL 64 (03/2022) at goal -Hx aortic atherosclerosis -Current treatment: Simvastatin 20 mg daily HS -Appropriate, Effective, Safe, Accessible -Medications previously tried: n/a  -Educated on Cholesterol goals; Benefits of statin for ASCVD risk reduction; -Recommended to continue current medication  Health Maintenance -Hx COPD, stable period off inhalers -MCI w/ memory loss  Patient Goals/Self-Care Activities Patient will:  - take medications as prescribed as evidenced by patient report and record review focus on medication adherence by routine check glucose using Dexcom, document, and provide at future appointments check blood  pressure periodically, document, and provide at future appointments       Patient verbalizes  understanding of instructions and care plan provided today and agrees to view in [place]. Active MyChart status and patient understanding of how to access instructions and care plan via MyChart confirmed with patient.    Telephone follow up appointment with pharmacy team member scheduled for: 2 months  Charlene Brooke, PharmD, [facility] Clinical Pharmacist [facility] [phone]

## 2022-06-22 NOTE — Progress Notes (Signed)
Chronic Care Management Pharmacy Note  06/22/2022 Name:  Anthony [name] MRN:  [id number] DOB:  1945/10/13  Summary: CCM F/U visit -DM: A1c 7.3% (02/2022), though query accuracy of A1c as CGM showed significantly worse control (GMI 8.6%); pt stopped taking Ozempic a few months ago due to concern over side effects after watching a media report about it; reviewed benefits outweigh risks of Ozempic and his BG control is of utmost importance to clear him for knee surgery  Recommendations/Changes made from today's visit: -Advised to resume Ozempic 1 mg weekly; keep upcoming appt with endocrine -Advised to always speak with providers before stopping/changing medication  Plan: -[place] will call patient monthly for medication coordination -Pharmacist follow up televisit scheduled for 2 months -PCP appt 07/24/22; endocrine appt 07/04/22    Subjective: Anthony [name] is an 76 y.o. year old male who is a primary patient of [name], MD.  The CCM team was consulted for assistance with disease management and care coordination needs.    Engaged with patient by telephone for follow up visit in response to provider referral for pharmacy case management and/or care coordination services.   Consent to Services:  The patient was given information about Chronic Care Management services, agreed to services, and gave verbal consent prior to initiation of services.  Please see initial visit note for detailed documentation.   Patient Care Team: [name], MD as PCP - General (Family Medicine) Werner Lean, MD as PCP - Cardiology (Cardiology) [name], MD (Neurosurgery) [name], MD as Consulting Physician (General Surgery) [name], MD (Inactive) as Consulting Physician (Urology) Thelma Comp, [place] as Consulting Physician (Optometry) Debbora Dus, [facility] (Inactive) as Pharmacist (Pharmacist) Dannielle Karvonen, RN as [place]  Management  Recent office visits: 04/19/22 Dr Danise Mina OV: annual - no changes.  Recent consult visits: 03/06/22 NP Cecilie Kicks (Cardiology): preop clearance, acceptable risk.  03/02/22 Dr Cruzita Lederer (Endocrine): DM - A1c 7.3%; insulin compliance is main issue - not taking Novolog in AM or out to dinner; CGM - TIR 34%, GMI 8.6%; BG is swinging widely, cannot be cleared for knee surgery. No changes to prescribed regimen, just improve compliance.  Hospital visits: None in previous 6 months   Objective:  Lab Results  Component Value Date   CREATININE 0.93 04/14/2022   BUN 15 04/14/2022   GFR 81.27 04/14/2022   GFRNONAA >60 03/02/2020   GFRAA >60 03/02/2020   NA 140 04/14/2022   K 3.6 04/14/2022   CALCIUM 9.1 04/14/2022   CO2 28 04/14/2022   GLUCOSE 178 (H) 04/14/2022    Lab Results  Component Value Date/Time   HGBA1C 7.3 (A) 03/02/2022 01:17 PM   HGBA1C 7.0 (A) 12/27/2021 12:49 PM   HGBA1C 7.7 (H) 03/02/2020 10:26 AM   HGBA1C 7.8 (H) 01/12/2020 10:21 AM   FRUCTOSAMINE 314 (H) 04/14/2022 10:55 AM   GFR 81.27 04/14/2022 10:54 AM   GFR 68.45 03/08/2021 03:08 PM   MICROALBUR 6.6 (H) 04/14/2022 10:54 AM   MICROALBUR 6.3 (H) 06/28/2015 09:39 AM    Last diabetic Eye exam:  Lab Results  Component Value Date/Time   HMDIABEYEEXA No Retinopathy 09/23/2021 12:00 AM   HMDIABEYEEXA No Retinopathy 09/23/2021 12:00 AM    Last diabetic Foot exam:  Lab Results  Component Value Date/Time   HMDIABFOOTEX done by podiatry - in chart 05/19/2019 12:00 AM     Lab Results  Component Value Date   CHOL 128 04/14/2022   HDL 40.80 04/14/2022  LDLCALC 64 04/14/2022   LDLDIRECT 94.0 10/05/2016   TRIG 115.0 04/14/2022   CHOLHDL 3 04/14/2022       Latest Ref Rng & Units 04/14/2022   10:54 AM 08/08/2021    3:54 PM 03/08/2021    3:08 PM  Hepatic Function  Total Protein 6.0 - 8.3 g/dL 6.1   6.7   Albumin 3.5 - 5.2 g/dL 4.0   4.5   AST 0 - 37 U/L 13   12   ALT 0 - 53 U/L _0 Alk  Phosphatase 39 - 117 U/L 83   99   Total Bilirubin 0.2 - 1.2 mg/dL 1.0   1.0     Lab Results  Component Value Date/Time   TSH 2.05 04/14/2022 10:55 AM   TSH 2.83 03/08/2021 03:08 PM   FREET4 0.88 06/26/2014 09:05 AM       Latest Ref Rng & Units 02/24/2022    4:09 PM 03/02/2020   10:26 AM 01/05/2017   12:28 PM  CBC  WBC 3.8 - 10.8 Thousand/uL 7.0  8.5  5.7   Hemoglobin 13.2 - 17.1 g/dL 13.7  14.5  14.2   Hematocrit 38.5 - 50.0 % 40.7  42.8  41.3   Platelets 140 - 400 Thousand/uL 230  227  209.0     No results found for: "VD25OH"  Clinical ASCVD: Yes  The ASCVD Risk score (Arnett DK, et al., 2019) failed to calculate for the following reasons:   The valid total cholesterol range is 130 to 320 mg/dL       04/13/2022    2:09 PM 02/16/2021    9:27 AM 02/15/2021    1:09 PM  Depression screen PHQ 2/9  Decreased Interest 0 0 0  Down, Depressed, Hopeless 0 0 0  PHQ - 2 Score 0 0 0     Social History   Tobacco Use  Smoking Status Former   Packs/day: 0.30   Years: 50.00   Total pack years: 15.00   Types: Cigarettes   Quit date: 08/29/2011   Years since quitting: 10.8  Smokeless Tobacco Never   BP Readings from Last 3 Encounters:  04/19/22 124/72  03/06/22 138/70  03/02/22 130/82   Pulse Readings from Last 3 Encounters:  04/19/22 78  03/06/22 72  03/02/22 73   Wt Readings from Last 3 Encounters:  04/19/22 224 lb 2 oz (101.7 kg)  04/13/22 220 lb (99.8 kg)  03/06/22 220 lb 12.8 oz (100.2 kg)   BMI Readings from Last 3 Encounters:  04/19/22 31.70 kg/m  04/13/22 29.84 kg/m  03/06/22 29.95 kg/m    Assessment/Interventions: Review of patient past medical history, allergies, medications, health status, including review of consultants reports, laboratory and other test data, was performed as part of comprehensive evaluation and provision of chronic care management services.   SDOH:  (Social Determinants of Health) assessments and interventions performed: No SDOH  Interventions    Flowsheet Row Chronic Care Management from 02/15/2021 in [place] at [place] Management from 01/20/2021 in [place] at [place] Management from 10/15/2020 in [place] at [place] Management from 08/25/2020 in [place] at [place] Management from 07/06/2020 in [place] [facility] [place] from 01/12/2020 in [place] at [place] Interventions Intervention Not Indicated -- -- -- -- --  Housing Interventions Intervention Not Indicated -- -- -- -- --  Transportation Interventions Intervention Not Indicated -- -- -- -- --  Depression Interventions/Treatment  -- -- -- -- -- PHQ2-9 Score <4 Follow-up Not Indicated  Financial Strain Interventions -- Other (Comment)  [Apply for Ozempic assistance in coverage gap] Intervention Not Indicated  [Medications affordable] Intervention Not Indicated Other (Comment)  [patient assistance, coupon] --      SDOH Screenings   Food Insecurity: No Food Insecurity (04/13/2022)  Housing: Low Risk  (02/16/2021)  Transportation Needs: No Transportation Needs (04/13/2022)  Alcohol Screen: Low Risk  (01/12/2020)  Depression (PHQ2-9): Low Risk  (04/13/2022)  Financial Resource Strain: Low Risk  (04/13/2022)  Physical Activity: Inactive (04/13/2022)  Stress: No Stress Concern Present (04/13/2022)  Tobacco Use: Medium Risk (04/19/2022)    [place]  No Known Allergies  Medications Reviewed Today     Reviewed by Charlton Haws, [facility] (Pharmacist) on 06/22/22 at 1228  Med List Status: <None>   Medication Order Taking? Sig Documenting Provider Last Dose Status Informant  amLODipine (NORVASC) 5 MG tablet [id number] Yes Take 1 tablet (5 mg total) by mouth daily. [name], MD Taking Active   b complex vitamins tablet [id number] Yes Take 1 tablet by mouth daily.  [provider] Taking Active Self  Continuous Blood Gluc Sensor ([place]) [place] [id number] Yes by Does not apply route. [provider] Taking Active   Dextrose, Diabetic Use, (GLUCOSE PO) [id number] Yes Take 1 tablet by mouth as needed. [provider] Taking Active Self  glucose blood (ONETOUCH ULTRA) test strip [id number] Yes USE AS DIRECTED FOUR TIMES DAILY [name], MD Taking Active   GVOKE HYPOPEN 1-PACK 1 MG/0.2ML Darden Palmer [id number] Yes Inject under skin 0.2 mL as needed for hypoglycemia [name], MD Taking Active   insulin degludec (TRESIBA FLEXTOUCH) 200 UNIT/ML FlexTouch Pen [id number] Yes inject 32 units into THE SKIN DAILY [name], MD Taking Active   Insulin Pen Needle (ADVOCATE INSULIN PEN NEEDLES) 31G X 5 MM MISC [id number] Yes Use 3x a day [name], MD Taking Active Self  metFORMIN (GLUCOPHAGE-XR) 500 MG 24 hr tablet [id number] Yes Take 1 tablet (500 mg total) by mouth daily with breakfast. [name], MD Taking Active   NOVOLOG FLEXPEN 100 UNIT/ML FlexPen [id number] Yes INJECT 10-18 UNITS into THE SKIN THREE TIMES DAILY with meals [name], MD Taking Active   olmesartan-hydrochlorothiazide (BENICAR HCT) 40-25 MG tablet [id number] Yes Take 1 tablet by mouth daily. [name], MD Taking Active   OZEMPIC, 1 MG/DOSE, 4 MG/3ML SOPN [id number] No Inject 1 mg into the skin once a week.  Patient not taking: Reported on 06/22/2022   [name], MD Not Taking Active            Med Note Rolan Bucco Jun 22, 2022 12:27 PM) Patient stopped taking on his own, advised to restart 06/22/22 and pt agreed  pantoprazole (PROTONIX) 40 MG tablet [id number] Yes Take 1 tablet (40 mg total) by mouth daily. [name], MD Taking Active   simvastatin (ZOCOR) 20 MG tablet [id number] Yes TAKE 1 TABLET BY MOUTH EVERYDAY AT BEDTIME [name], MD Taking Active   tadalafil (CIALIS) 20 MG tablet  [id number] Yes TAKE 1/2 TO 1 TABLET BY MOUTH every other DAY AS NEEDED FOR erectile dysfunction Needs annual physical for additional refills [name], MD Taking Active             Patient Active Problem List   Diagnosis Date Noted   Gross hematuria 04/19/2022  Pre-operative exam 02/25/2022   Primary osteoarthritis of right knee 01/17/2022   Primary osteoarthritis of left knee 12/27/2021   Type 2 diabetes mellitus with hypoglycemia ([place]) 03/10/2021   MCI (mild cognitive impairment) with memory loss 02/10/2021   Non-adherence to medical treatment 10/27/2020   COPD (chronic obstructive pulmonary disease) ([place]) 02/28/2020   Coronary artery calcification seen on CAT scan 02/28/2020   Aortic atherosclerosis ([place]) 02/28/2020   Ex-smoker 01/21/2020   Numbness and tingling in left arm 01/21/2020   Health maintenance examination 01/05/2017   Barrett's esophagus 12/08/2015   Abnormal tympanic membrane of right ear 07/05/2015   Advanced care planning/counseling discussion 06/30/2014   Medicare annual wellness visit, subsequent 06/22/2013   Benign prostatic hyperplasia with urinary obstruction 06/18/2013   ED (erectile dysfunction) 04/10/2012   Hyperlipidemia associated with type 2 diabetes mellitus ([place]) 12/29/2008   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 ([place]) 07/24/2007   GERD 07/24/2007    Immunization History  Administered Date(s) Administered   Fluad Quad(high Dose 65+) 06/21/2020   Influenza Whole 06/02/2008, 06/02/2009, 06/02/2010, 07/08/2011, 07/08/2012   Influenza, High Dose Seasonal PF 05/18/2017, 06/25/2018, 04/18/2019, 07/04/2021   Influenza,inj,Quad PF,6+ Mos 07/05/2015   Influenza-Unspecified 05/28/2013, 05/28/2014, 06/30/2016   PFIZER(Purple Top)SARS-COV-2 Vaccination 10/27/2019, 11/25/2019   Pneumococcal Conjugate-13 06/18/2013   Pneumococcal Polysaccharide-23 07/08/2011, 12/29/2016   Tetanus 06/18/2013   Zoster  Recombinat (Shingrix) 05/18/2017, 01/14/2018   Zoster, Live 08/26/2014    Conditions to be addressed/monitored:  Hypertension, Hyperlipidemia, Diabetes, and Coronary Artery Disease  Care Plan : [place]  Updates made by Charlton Haws, [place] since 06/22/2022 12:00 AM     Problem: Hypertension, Hyperlipidemia, Diabetes, and Coronary Artery Disease   Priority: High     Long-Range Goal: Disease Management   Start Date: 11/23/2020  Expected End Date: 06/23/2023  This Visit's Progress: On track  Recent Progress: On track  Priority: High  Note:   Current Barriers:  Unable to achieve control of diabetes Did not discuss medication concerns with prescriber  Pharmacist Clinical Goal(s):  Patient will adhere to plan to optimize therapeutic regimen for DM as evidenced by report of adherence to recommended medication management changes contact provider office for questions/concerns as evidenced notation of same in electronic health record through collaboration with PharmD and provider.   Interventions: 1:1 collaboration with [name], MD regarding development and update of comprehensive plan of care as evidenced by provider attestation and co-signature Inter-disciplinary care team collaboration (see longitudinal plan of care) Comprehensive medication review performed; medication list updated in electronic medical record  Diabetes (A1c goal <8%) -Not ideally controlled - A1c 7.3% (02/2022), A1c has not been accurate in comparison to CGM data which shows worse control; he stopped Ozempic a few months ago due to fear of side effects after seeing media reports about it; he is trying to get cleared to have knee surgery but poor BG control has prevented this so far -Follows with endocrine (Dr Cruzita Lederer); hx severe hypoglycemia and memory impairment -DM dx 2006, insulin since 2008 -Current medications: Metformin 500 mg daily AM -  Appropriate, Effective, Safe,  Accessible Novolog 08-11-17 u w meals - Appropriate, Query Effective Tresiba U200 32 units daily - Appropriate, Query Effective Ozempic 1 mg weekly -Appropriate, Query Effective Gvoke Hypopen PRN - not used Dexcom G7 -Appropriate, Effective, Safe, Accessible -Medications previously tried: upset stomach on higher dose metformin  -Current meal patterns: eats 2 meals/day (late breakfast ~11 AM, early dinner ~ 5 PM) -Current exercise: tries  to walk when he can -Educated on A1c and blood sugar goals; consequences of high BG on wound healing/surgery outcomes -Reviewed efficacy/safety data for Ozempic, overwhelmingly benefits outweigh risks; he has not had side effects himself (other than appetite suppression) -Advised pt to restart Ozempic (he has 2 pens) and f/u with Dr Cruzita Lederer as scheduled  Hypertension (BP goal <140/90) -Controlled - per OV readings -Current home readings: n/a; Denies hypotensive/hypertensive symptoms -Current treatment: Amlodipine 5 mg daily - Appropriate, Effective, Safe, Accessible Olmesartan-HCTZ 40-25 mg daily -Appropriate, Effective, Safe, Accessible -Medications previously tried: n/a  -Educated on BP goals and benefits of medications for prevention of heart attack, stroke and kidney damage; -Counseled to monitor BP at home periodically -Recommended to continue current medication  Hyperlipidemia: (LDL goal < 70) -Controlled - LDL 64 (03/2022) at goal -Hx aortic atherosclerosis -Current treatment: Simvastatin 20 mg daily HS -Appropriate, Effective, Safe, Accessible -Medications previously tried: n/a  -Educated on Cholesterol goals; Benefits of statin for ASCVD risk reduction; -Recommended to continue current medication  Health Maintenance -Hx COPD, stable period off inhalers -MCI w/ memory loss  Patient Goals/Self-Care Activities Patient will:  - take medications as prescribed as evidenced by patient report and record review focus on medication adherence by  routine check glucose using Dexcom, document, and provide at future appointments check blood pressure periodically, document, and provide at future appointments       Medication Assistance: None required.  Patient affirms current coverage meets needs.  Compliance/Adherence/Medication fill history: Care Gaps: None  Star-Rating Drugs: Simvastatin - PDC 100% Metformin - PDC 0% Ozempic - PDC 25%; LF 01/09/22 x 28ds Olmesartan-HCTZ - PDC 99%  Medication Access: Within the past 30 days, how often has patient missed a dose of medication? 0 Is a pillbox or other method used to improve adherence? Yes  Factors that may affect medication adherence? lack of understanding of disease management Are meds synced by current pharmacy? Yes  Are meds delivered by current pharmacy? Yes  Does patient experience delays in picking up medications due to transportation concerns? No   [facility] Reviewed: Is patient disadvantaged to use [facility]?: No  Current Rx insurance plan: [employer] Name and location of Current pharmacy:  [facility] - [place], [place] - [address] [place] [address] Phone: [phone] Fax: [phone]  [facility] services reviewed with patient today?: Yes  VIAL medications (30-day, delivered 06/13/22) Simvastatin 20 mg Take 1 tablet every day at bedtime OneTouch Test strips - Use up to four times daily Tadalafil 20 mg - 1/2 to 1 tablet daily as needed- PRN use Olmesartan/HCTZ 40-25 mg Take 1 tablet by mouth every day Amlodipine 5 mg 1 tablet daily Pantoprazole 40 mg 1 tablet daily Novolog Flexpen Inject 10-18 units into the skin 3 times daily with meals Tresiba Flex 200 units- 32 units once daily in morning    Patient declined the following medications this month: Metformin 500 mg ER - take 1 tablet every morning (pt has excess supply as RX was for 3 a day now takes 1) Ozempic 1 mg/dose -  Inject once weekly - not going to take any more Gvoke Hypopen - 1 mg - Inject 0.2 mL as needed   Care Plan and Follow Up Patient Decision:  Patient agrees to Care Plan and Follow-up.  Plan: Telephone follow up appointment with care management team member scheduled for:  2 months  Charlene Brooke, PharmD, BCACP Clinical Pharmacist [facility] at  [place] [phone]

## 2022-06-28 ENCOUNTER — Other Ambulatory Visit: Payer: Self-pay | Admitting: Internal Medicine

## 2022-06-28 DIAGNOSIS — E113299 Type 2 diabetes mellitus with mild nonproliferative diabetic retinopathy without macular edema, unspecified eye: Secondary | ICD-10-CM

## 2022-06-30 ENCOUNTER — Telehealth: Payer: Self-pay

## 2022-06-30 ENCOUNTER — Ambulatory Visit
Admission: RE | Admit: 2022-06-30 | Discharge: 2022-06-30 | Disposition: A | Discharge status: Home / Self Care | Payer: Medicare HMO | Source: Ambulatory Visit | Attending: Acute Care | Admitting: Acute Care

## 2022-06-30 DIAGNOSIS — I700 Atherosclerosis of aorta: Secondary | ICD-10-CM | POA: Diagnosis not present

## 2022-06-30 DIAGNOSIS — R918 Other nonspecific abnormal finding of lung field: Secondary | ICD-10-CM | POA: Diagnosis not present

## 2022-06-30 DIAGNOSIS — J439 Emphysema, unspecified: Secondary | ICD-10-CM | POA: Diagnosis not present

## 2022-06-30 DIAGNOSIS — R911 Solitary pulmonary nodule: Secondary | ICD-10-CM

## 2022-06-30 DIAGNOSIS — Z87891 Personal history of nicotine dependence: Secondary | ICD-10-CM

## 2022-06-30 NOTE — Chronic Care Management (AMB) (Unsigned)
Chronic Care Management Pharmacy Assistant   Name: Anthony [name]  MRN: [id number] DOB: March 05, 1946  Reason for Encounter: Medication Adherence and Delivery Coordination    Recent office visits:  None since last CCM contact  Recent consult visits:  None since last CCM contact  Hospital visits:  None in previous 6 months  Medications: Outpatient Encounter Medications as of 06/30/2022  Medication Sig Note   amLODipine (NORVASC) 5 MG tablet Take 1 tablet (5 mg total) by mouth daily.    b complex vitamins tablet Take 1 tablet by mouth daily.    Continuous Blood Gluc Sensor (DEXCOM G7 SENSOR) MISC by Does not apply route.    Dextrose, Diabetic Use, (GLUCOSE PO) Take 1 tablet by mouth as needed.    glucose blood (ONETOUCH ULTRA) test strip USE TO check blood glucose four times daily AS DIRECTED    GVOKE HYPOPEN 1-PACK 1 MG/0.2ML SOAJ Inject under skin 0.2 mL as needed for hypoglycemia    insulin degludec (TRESIBA FLEXTOUCH) 200 UNIT/ML FlexTouch Pen inject 32 units into THE SKIN DAILY    Insulin Pen Needle (ADVOCATE INSULIN PEN NEEDLES) 31G X 5 MM MISC Use 3x a day    metFORMIN (GLUCOPHAGE-XR) 500 MG 24 hr tablet Take 1 tablet (500 mg total) by mouth daily with breakfast.    NOVOLOG FLEXPEN 100 UNIT/ML FlexPen INJECT 10-18 UNITS into THE SKIN THREE TIMES DAILY with meals    olmesartan-hydrochlorothiazide (BENICAR HCT) 40-25 MG tablet Take 1 tablet by mouth daily.    OZEMPIC, 1 MG/DOSE, 4 MG/3ML SOPN Inject 1 mg into the skin once a week. (Patient not taking: Reported on 06/22/2022) 06/22/2022: Patient stopped taking on his own, advised to restart 06/22/22 and pt agreed   pantoprazole (PROTONIX) 40 MG tablet Take 1 tablet (40 mg total) by mouth daily.    simvastatin (ZOCOR) 20 MG tablet TAKE 1 TABLET BY MOUTH EVERYDAY AT BEDTIME    tadalafil (CIALIS) 20 MG tablet TAKE 1/2 TO 1 TABLET BY MOUTH every other DAY AS NEEDED FOR erectile dysfunction Needs annual physical for additional  refills    No facility-administered encounter medications on file as of 06/30/2022.   BP Readings from Last 3 Encounters:  04/19/22 124/72  03/06/22 138/70  03/02/22 130/82    Lab Results  Component Value Date   HGBA1C 7.3 (A) 03/02/2022      No OVs, Consults, or hospital visits since last care coordination call / Pharmacist visit. No medication changes indicated   Last adherence delivery date:06/13/22      Patient is due for next adherence delivery on: 07/13/22  Spoke with patient on 07/03/22 reviewed medications and coordinated delivery.  This delivery to include: Vials  30 Days  Easy open caps VIAL medications: Simvastatin 20 mg Take 1 tablet every day at bedtime OneTouch Test strips - Use up to four times daily Tadalafil 20 mg - 1/2 to 1 tablet daily as needed- PRN use Olmesartan/HCTZ 40-25 mg Take 1 tablet by mouth every day Amlodipine 5 mg 1 tablet daily Pantoprazole 40 mg 1 tablet daily Novolog Flexpen Inject 10-18 units into the skin 3 times daily with meals Tresiba Flex 200 units- 32 units once daily in morning    Patient declined the following medications this month: Metformin 500 mg ER - take 1 tablet every morning (pt has excess supply as RX was for 3 a day now takes 1) Ozempic 1 mg/dose - Inject once weekly - patient is now ready to start again  Gvoke Hypopen - 1 mg - Inject 0.2 mL as needed    Any concerns about your medications? Yes The patient states he is ready to take ozempic again   How often do you forget or accidentally miss a dose? Rarely patient was scared of side effects of  ozempic   Do you use a pillbox? No  Is patient in packaging No   No refill request needed.  Confirmed delivery date of 07/13/22, advised patient that pharmacy will contact them the morning of delivery.  Recent blood pressure readings are as follows:none available    Recent blood glucose readings are as follows: Fasting: 07/03/22  215  The patient reports highest BG  300.  The patient reports using Dexcom 7 and has alarm set for low of 90. This rings middle of night and he gets up and takes glucose tablets.   Annual wellness visit in last year? Yes Most Recent BP reading:124/72  If Diabetic: Most recent A1C reading:7.3 Last eye exam / retinopathy screening:UTD Last diabetic foot exam:UTD  Cycle dispensing form sent to [facility], CPP for review.    Pharmacist addendum: Patient was advised to restart Ozempic in last CCM appt (10/26). He apparently has not restarted. Juda Toepfer will reach back out to remind patient that he should restart Ozempic and he can use the pens that he has on hand (he told me he had 2 pens remaining).  Charlene Brooke, PharmD, BCACP 07/05/22 2:20 PM

## 2022-07-04 ENCOUNTER — Ambulatory Visit: Payer: Medicare HMO | Admitting: Internal Medicine
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
## 2022-07-05 ENCOUNTER — Telehealth: Payer: Self-pay | Admitting: Acute Care

## 2022-07-05 ENCOUNTER — Ambulatory Visit (INDEPENDENT_AMBULATORY_CARE_PROVIDER_SITE_OTHER): Payer: Medicare HMO | Admitting: Podiatry

## 2022-07-05 DIAGNOSIS — Z122 Encounter for screening for malignant neoplasm of respiratory organs: Secondary | ICD-10-CM

## 2022-07-05 DIAGNOSIS — Z87891 Personal history of nicotine dependence: Secondary | ICD-10-CM

## 2022-07-05 DIAGNOSIS — Z91199 Patient's noncompliance with other medical treatment and regimen due to unspecified reason: Secondary | ICD-10-CM

## 2022-07-05 NOTE — Progress Notes (Signed)
1. No-show for appointment     

## 2022-07-05 NOTE — Telephone Encounter (Signed)
Please call the patient and let him know his scan shows that the nodules of concern have decreased in size in the last 3 months.  12 month follow up as radiology suggests. Thanks so much

## 2022-07-06 ENCOUNTER — Ambulatory Visit: Payer: Self-pay

## 2022-07-06 DIAGNOSIS — Z794 Long term (current) use of insulin: Secondary | ICD-10-CM | POA: Diagnosis not present

## 2022-07-06 DIAGNOSIS — E113291 Type 2 diabetes mellitus with mild nonproliferative diabetic retinopathy without macular edema, right eye: Secondary | ICD-10-CM | POA: Diagnosis not present

## 2022-07-06 NOTE — Patient Outreach (Signed)
  Care Coordination   07/06/2022 Name: Anthony [name] MRN: [id number] DOB: 1946/06/25   Care Coordination Outreach Attempts:  An unsuccessful telephone outreach was attempted for a scheduled appointment today.  Follow Up Plan:  Additional outreach attempts will be made to offer the patient care coordination information and services.   Encounter Outcome:  No Answer  Care Coordination Interventions Activated:  No   Care Coordination Interventions:  No, not indicated    Quinn Plowman RN,BSN,CCM [place] [phone] direct line

## 2022-07-06 NOTE — Telephone Encounter (Signed)
Attempt to call results of LDCT.  No answer and left VM. Will also send results thru mychart.  New order placed for 2024 annual LDCT and results faxed to PCP

## 2022-07-10 ENCOUNTER — Telehealth: Payer: Self-pay | Admitting: *Deleted

## 2022-07-10 NOTE — Progress Notes (Signed)
  Care Coordination Note  07/10/2022 Name: KASTIEL SIMONIAN MRN: [id number] DOB: 1945/09/08  JAHLANI LORENTZ is a 76 y.o. year old male who is a primary care patient of [name], MD and is actively engaged with the care management team. I reached out to [name] by phone today to assist with re-scheduling a follow up visit with the RN Case Manager  Follow up plan: Pt requests call back tomorrow   Julian Hy, [place] Direct Dial: [phone]

## 2022-07-24 ENCOUNTER — Ambulatory Visit: Payer: Medicare HMO | Admitting: Family Medicine

## 2022-07-26 ENCOUNTER — Other Ambulatory Visit: Payer: Self-pay | Admitting: Family Medicine

## 2022-07-26 DIAGNOSIS — N529 Male erectile dysfunction, unspecified: Secondary | ICD-10-CM

## 2022-08-01 ENCOUNTER — Telehealth: Payer: Self-pay

## 2022-08-01 NOTE — Progress Notes (Signed)
Chronic Care Management Pharmacy Assistant   Name: Anthony [name]  MRN: [id number] DOB: Nov 07, 1945  Reason for Encounter: CCM (Medication Adherence and Delivery Coordination)  Recent office visits:  None since last CCM contact  Recent consult visits:  None since last CCM contact  Hospital visits:  None in previous 6 months  Medications: Outpatient Encounter Medications as of 08/01/2022  Medication Sig Note   amLODipine (NORVASC) 5 MG tablet Take 1 tablet (5 mg total) by mouth daily.    b complex vitamins tablet Take 1 tablet by mouth daily.    Continuous Blood Gluc Sensor (DEXCOM G7 SENSOR) MISC by Does not apply route.    Dextrose, Diabetic Use, (GLUCOSE PO) Take 1 tablet by mouth as needed.    glucose blood (ONETOUCH ULTRA) test strip USE TO check blood glucose four times daily AS DIRECTED    GVOKE HYPOPEN 1-PACK 1 MG/0.2ML SOAJ Inject under skin 0.2 mL as needed for hypoglycemia    insulin degludec (TRESIBA FLEXTOUCH) 200 UNIT/ML FlexTouch Pen inject 32 units into THE SKIN DAILY    Insulin Pen Needle (ADVOCATE INSULIN PEN NEEDLES) 31G X 5 MM MISC Use 3x a day    metFORMIN (GLUCOPHAGE-XR) 500 MG 24 hr tablet Take 1 tablet (500 mg total) by mouth daily with breakfast.    NOVOLOG FLEXPEN 100 UNIT/ML FlexPen INJECT 10-18 UNITS into THE SKIN THREE TIMES DAILY with meals    olmesartan-hydrochlorothiazide (BENICAR HCT) 40-25 MG tablet Take 1 tablet by mouth daily.    OZEMPIC, 1 MG/DOSE, 4 MG/3ML SOPN Inject 1 mg into the skin once a week. (Patient not taking: Reported on 06/22/2022) 06/22/2022: Patient stopped taking on his own, advised to restart 06/22/22 and pt agreed   pantoprazole (PROTONIX) 40 MG tablet Take 1 tablet (40 mg total) by mouth daily.    simvastatin (ZOCOR) 20 MG tablet TAKE 1 TABLET BY MOUTH EVERYDAY AT BEDTIME    tadalafil (CIALIS) 20 MG tablet TAKE 1/2 TO 1 TABLET BY MOUTH every other DAY AS NEEDED FOR erectile dysfunction    No facility-administered  encounter medications on file as of 08/01/2022.   BP Readings from Last 3 Encounters:  04/19/22 124/72  03/06/22 138/70  03/02/22 130/82    Pulse Readings from Last 3 Encounters:  04/19/22 78  03/06/22 72  03/02/22 73    Lab Results  Component Value Date/Time   HGBA1C 7.3 (A) 03/02/2022 01:17 PM   HGBA1C 7.0 (A) 12/27/2021 12:49 PM   HGBA1C 7.7 (H) 03/02/2020 10:26 AM   HGBA1C 7.8 (H) 01/12/2020 10:21 AM   Lab Results  Component Value Date   CREATININE 0.93 04/14/2022   BUN 15 04/14/2022   GFR 81.27 04/14/2022   GFRNONAA >60 03/02/2020   GFRAA >60 03/02/2020   NA 140 04/14/2022   K 3.6 04/14/2022   CALCIUM 9.1 04/14/2022   CO2 28 04/14/2022   Last adherence delivery date:  07/13/2022     Patient is due for next adherence delivery on: 08/11/2022  Spoke with patient on 08/01/2022 reviewed medications and coordinated delivery.  VIAL medications: Simvastatin 20 mg Take 1 tablet every day at bedtime OneTouch Test strips - Use up to four times daily Tadalafil 20 mg - 1/2 to 1 tablet daily as needed- PRN use Olmesartan/HCTZ 40-25 mg Take 1 tablet by mouth every day Amlodipine 5 mg 1 tablet daily Pantoprazole 40 mg 1 tablet daily Novolog Flexpen Inject 10-18 units into the skin 3 times daily with meals Tresiba Flex 200 units-  32 units once daily in morning     Any concerns about your medications? No   How often do you forget or accidentally miss a dose? Never   Do you use a pillbox? No   Is patient in packaging No  No refill request needed.  Confirmed delivery date of 08/11/2022, advised patient that pharmacy will contact them the morning of delivery.   Recent blood pressure readings are as follows: Patient does not take blood pressure at home.    Recent blood glucose readings are as follows: Patient was not at home for readings.    Annual wellness visit in last year? Yes 04/13/2022 Most Recent BP reading: 124/72 on 04/19/2022   If Diabetic: Most recent A1C  reading: 7.3 on 03/02/2022 Last eye exam / retinopathy screening: Up to date Last diabetic foot exam: Up to date   Cycle dispensing form sent to [facility], CPP for review.  Charlene Brooke, CPP notified  [name], [place] Clinical Pharmacy Assistant [phone]

## 2022-08-04 IMAGING — DX DG KNEE COMPLETE 4+V*L*
4 series · 4 of 4 positions shown · non-contrast
Comparison: X-ray 03/13/2013.

CLINICAL DATA: Chronic medial left knee pain, "popping".

EXAM:
LEFT KNEE - COMPLETE 4+ VIEW

[knee ap]
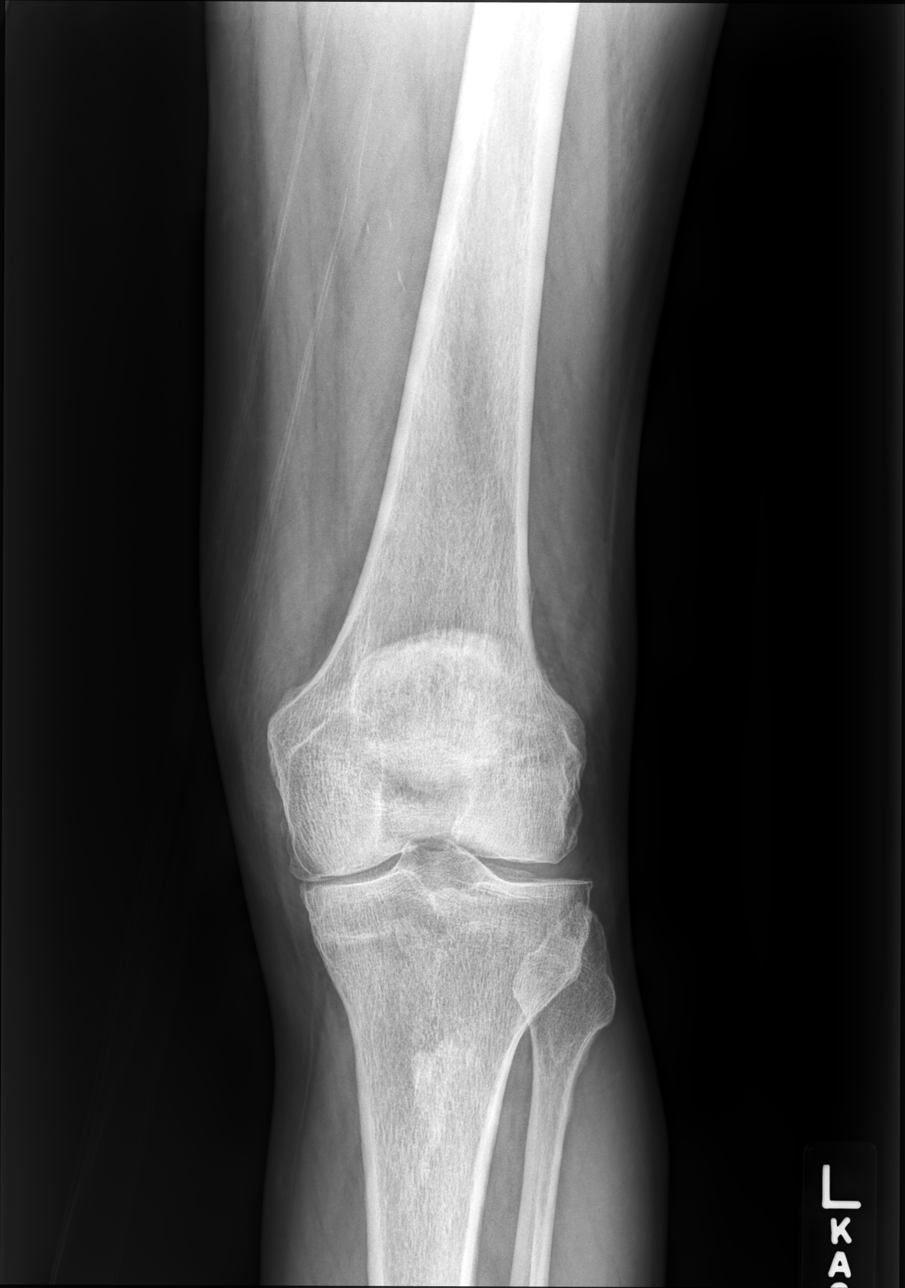

[knee mlo (1 of 2)]
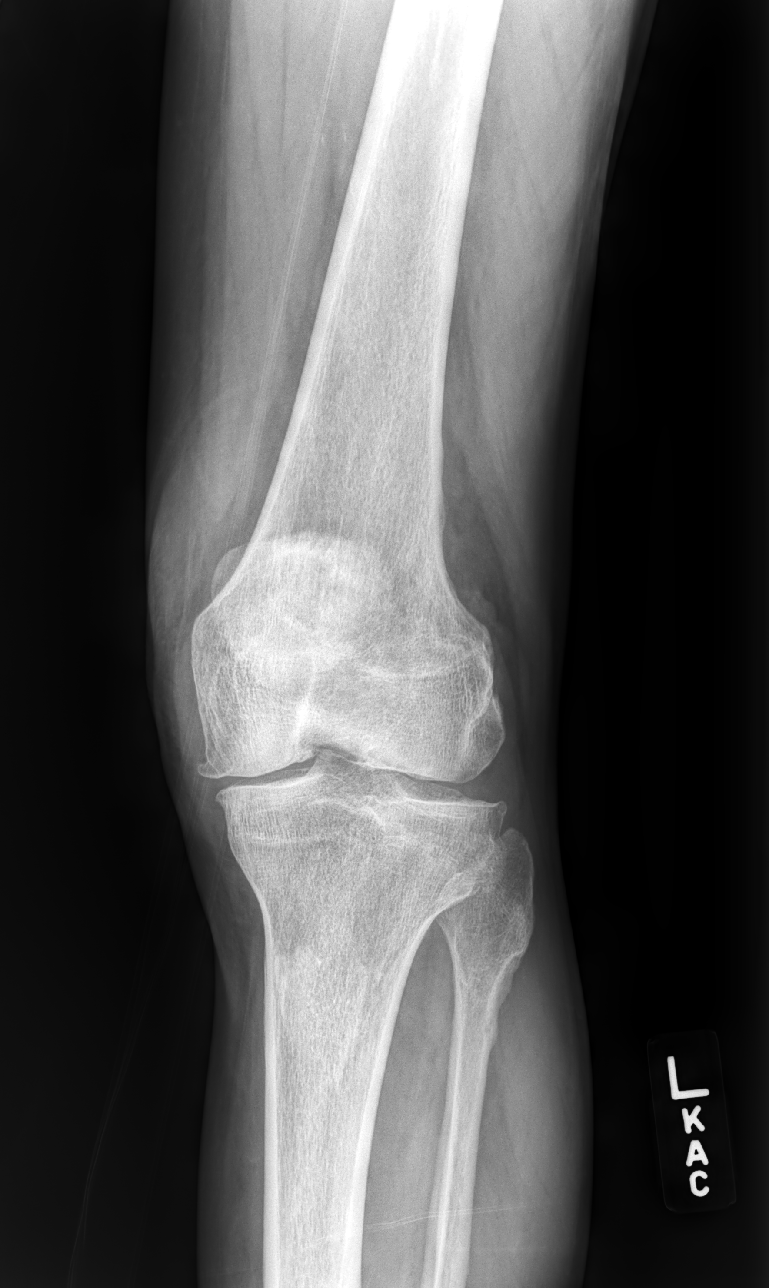

[knee mlo (2 of 2)]
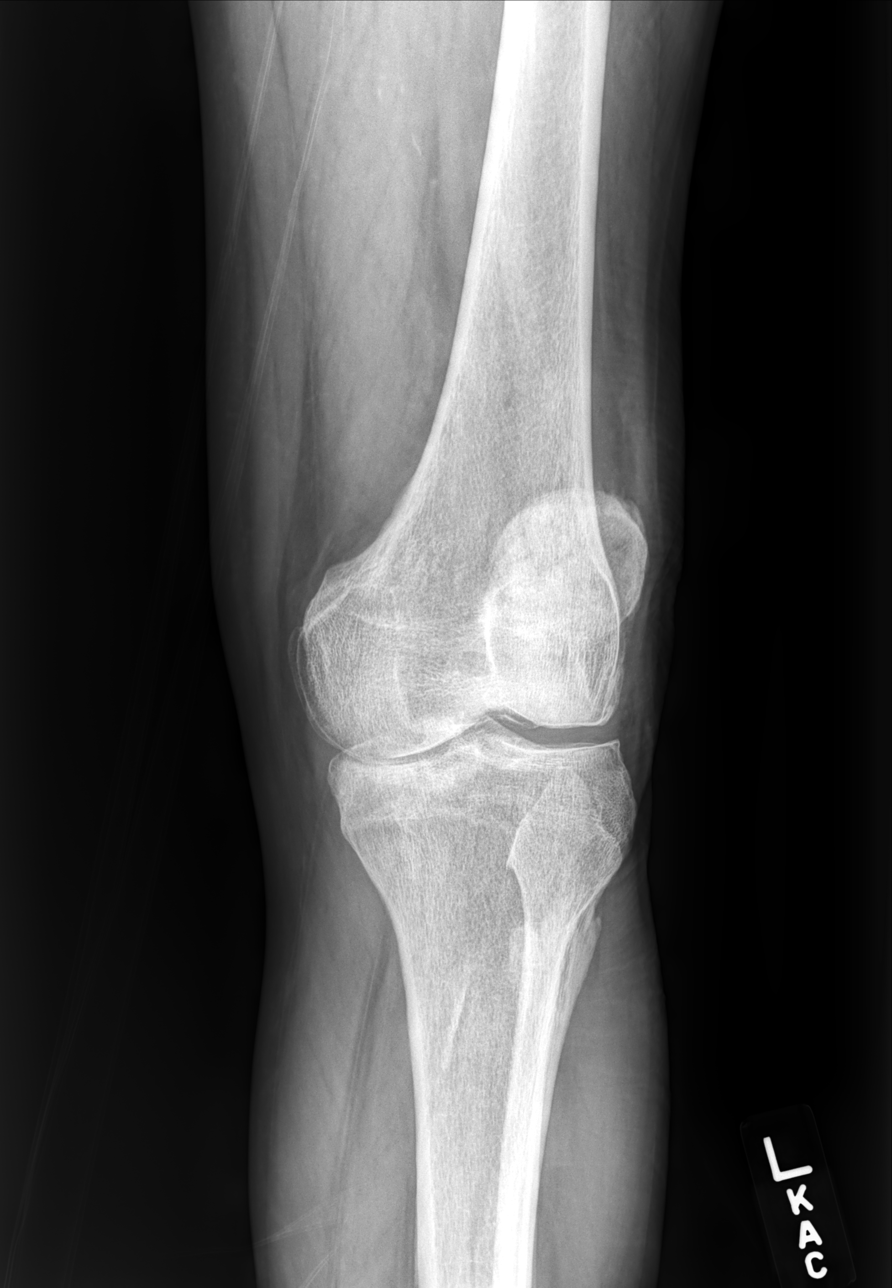

[knee lat]
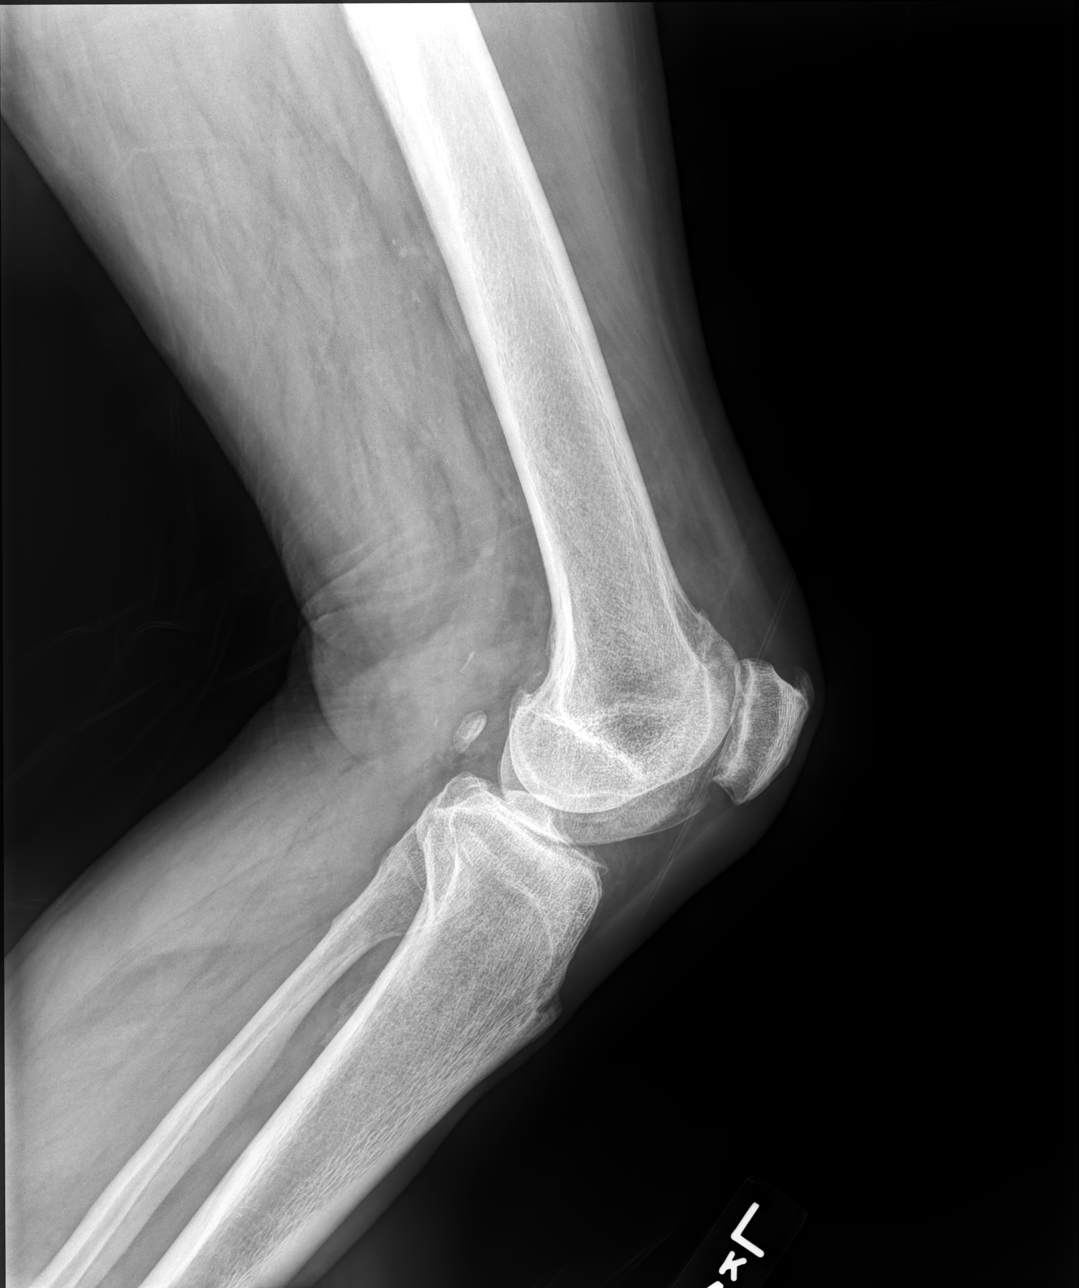

[4 of 4 positions shown; findings below may reference images not displayed]

FINDINGS: Diffuse tricompartment degenerative change. Joint space loss most
prominent about the medial compartment. Degenerative changes have
progressed from prior study of 03/13/2013. No acute bony
abnormality. Tiny knee joint effusion cannot be excluded. Peripheral
vascular calcification.
IMPRESSION: Diffuse tricompartment degenerative change, most prominent about the
medial compartment. Degenerative changes have progressed from prior
study of 03/13/2013. No acute bony abnormality identified. Tiny knee
joint effusion cannot be excluded.

## 2022-08-05 DIAGNOSIS — Z794 Long term (current) use of insulin: Secondary | ICD-10-CM | POA: Diagnosis not present

## 2022-08-05 DIAGNOSIS — E113291 Type 2 diabetes mellitus with mild nonproliferative diabetic retinopathy without macular edema, right eye: Secondary | ICD-10-CM | POA: Diagnosis not present

## 2022-08-09 ENCOUNTER — Telehealth: Payer: Self-pay

## 2022-08-09 NOTE — Progress Notes (Signed)
    Chronic Care Management Pharmacy Assistant   Name: Anthony [name]  MRN: [id number] DOB: 1946-06-19  Reason for Encounter: CCM (Appointment Reminder)  Medications: Outpatient Encounter Medications as of 08/09/2022  Medication Sig Note   amLODipine (NORVASC) 5 MG tablet Take 1 tablet (5 mg total) by mouth daily.    b complex vitamins tablet Take 1 tablet by mouth daily.    Continuous Blood Gluc Sensor (DEXCOM G7 SENSOR) MISC by Does not apply route.    Dextrose, Diabetic Use, (GLUCOSE PO) Take 1 tablet by mouth as needed.    glucose blood (ONETOUCH ULTRA) test strip USE TO check blood glucose four times daily AS DIRECTED    GVOKE HYPOPEN 1-PACK 1 MG/0.2ML SOAJ Inject under skin 0.2 mL as needed for hypoglycemia    insulin degludec (TRESIBA FLEXTOUCH) 200 UNIT/ML FlexTouch Pen inject 32 units into THE SKIN DAILY    Insulin Pen Needle (ADVOCATE INSULIN PEN NEEDLES) 31G X 5 MM MISC Use 3x a day    metFORMIN (GLUCOPHAGE-XR) 500 MG 24 hr tablet Take 1 tablet (500 mg total) by mouth daily with breakfast.    NOVOLOG FLEXPEN 100 UNIT/ML FlexPen INJECT 10-18 UNITS into THE SKIN THREE TIMES DAILY with meals    olmesartan-hydrochlorothiazide (BENICAR HCT) 40-25 MG tablet Take 1 tablet by mouth daily.    OZEMPIC, 1 MG/DOSE, 4 MG/3ML SOPN Inject 1 mg into the skin once a week. (Patient not taking: Reported on 06/22/2022) 06/22/2022: Patient stopped taking on his own, advised to restart 06/22/22 and pt agreed   pantoprazole (PROTONIX) 40 MG tablet Take 1 tablet (40 mg total) by mouth daily.    simvastatin (ZOCOR) 20 MG tablet TAKE 1 TABLET BY MOUTH EVERYDAY AT BEDTIME    tadalafil (CIALIS) 20 MG tablet TAKE 1/2 TO 1 TABLET BY MOUTH every other DAY AS NEEDED FOR erectile dysfunction    No facility-administered encounter medications on file as of 08/09/2022.   [name] was contacted to remind of upcoming telephone visit with Charlene Brooke on 08/14/2022 at 9:30. Patient was reminded to  have any blood glucose and blood pressure readings available for review at appointment.   Message was left reminding patient of appointment.  CCM referral has been placed prior to visit?  Yes   Charlene Brooke, CPP notified  Anthony [name], [place] Clinical Pharmacy Assistant [phone]

## 2022-08-14 ENCOUNTER — Ambulatory Visit (INDEPENDENT_AMBULATORY_CARE_PROVIDER_SITE_OTHER): Payer: Medicare HMO | Admitting: Podiatry

## 2022-08-14 ENCOUNTER — Encounter: Payer: Self-pay | Admitting: Podiatry

## 2022-08-14 ENCOUNTER — Other Ambulatory Visit: Payer: Medicare HMO

## 2022-08-14 ENCOUNTER — Ambulatory Visit: Payer: Medicare HMO | Admitting: Pharmacist

## 2022-08-14 VITALS — BP 148/83

## 2022-08-14 DIAGNOSIS — I700 Atherosclerosis of aorta: Secondary | ICD-10-CM

## 2022-08-14 DIAGNOSIS — L84 Corns and callosities: Secondary | ICD-10-CM

## 2022-08-14 DIAGNOSIS — E1142 Type 2 diabetes mellitus with diabetic polyneuropathy: Secondary | ICD-10-CM | POA: Diagnosis not present

## 2022-08-14 DIAGNOSIS — M79674 Pain in right toe(s): Secondary | ICD-10-CM

## 2022-08-14 DIAGNOSIS — M79675 Pain in left toe(s): Secondary | ICD-10-CM

## 2022-08-14 DIAGNOSIS — E113299 Type 2 diabetes mellitus with mild nonproliferative diabetic retinopathy without macular edema, unspecified eye: Secondary | ICD-10-CM

## 2022-08-14 DIAGNOSIS — M2041 Other hammer toe(s) (acquired), right foot: Secondary | ICD-10-CM

## 2022-08-14 DIAGNOSIS — B351 Tinea unguium: Secondary | ICD-10-CM | POA: Diagnosis not present

## 2022-08-14 DIAGNOSIS — M2042 Other hammer toe(s) (acquired), left foot: Secondary | ICD-10-CM

## 2022-08-14 DIAGNOSIS — M2012 Hallux valgus (acquired), left foot: Secondary | ICD-10-CM

## 2022-08-14 DIAGNOSIS — E1169 Type 2 diabetes mellitus with other specified complication: Secondary | ICD-10-CM

## 2022-08-14 DIAGNOSIS — M2011 Hallux valgus (acquired), right foot: Secondary | ICD-10-CM

## 2022-08-14 DIAGNOSIS — I10 Essential (primary) hypertension: Secondary | ICD-10-CM

## 2022-08-14 NOTE — Progress Notes (Signed)
Patient presents to the office today for diabetic shoe and insole measuring.  Patient was measured with Nary device to determine size and width for 1 pair of extra depth shoes and foam casted for 3 pair of insoles.  .   Documentation of medical necessity will be sent to patient's treating diabetic doctor to verify and sign.   Patient's diabetic provider: Gerri Lins  Shoes and insoles will be ordered at that time and patient will be notified for an appointment for fitting when they arrive.   Minniear measurement: 9 Patient shoe selection-   1st   Shoe choice:   [id number]  Shoe size ordered: 9 wide

## 2022-08-14 NOTE — Progress Notes (Signed)
Chronic Care Management Pharmacy Note  08/14/2022 Name:  Anthony [name] MRN:  [id number] DOB:  14-Apr-1946  Summary: CCM F/U visit -DM: A1c 7.3% (02/2022), though query accuracy of A1c as CGM showed significantly worse control (GMI 8.6%); pt stopped Ozempic for several months due to fear of side effects, he says full 1 mg dose is "too much" for him -MCI: pt missed 2 appts in Nov (PCP and endocrine) because he forgot about them, advised he reschedule these appts  Recommendations/Changes made from today's visit: -Advised to resume Ozempic, he can take 1/2 dose (dial pen up half-way) -Advised to always speak with providers before stopping/changing medication  Plan: -[facility] will call patient monthly for medication coordination -Pharmacist follow up televisit scheduled for 3 months -Reschedule missed PCP and endocrine appts from Nov 2023    Subjective: Anthony [name] is an 76 y.o. year old male who is a primary patient of [name], MD.  The CCM team was consulted for assistance with disease management and care coordination needs.    Engaged with patient by telephone for follow up visit in response to provider referral for pharmacy case management and/or care coordination services.   Consent to Services:  The patient was given information about Chronic Care Management services, agreed to services, and gave verbal consent prior to initiation of services.  Please see initial visit note for detailed documentation.   Patient Care Team: [name], MD as PCP - General (Family Medicine) Werner Lean, MD as PCP - Cardiology (Cardiology) [name], MD (Neurosurgery) [name], MD as Consulting Physician (General Surgery) [name], MD (Inactive) as Consulting Physician (Urology) Thelma Comp, [place] as Consulting Physician (Optometry) Dannielle Karvonen, RN as [facility] Charlton Haws, [facility] as Pharmacist  (Pharmacist)  Recent office visits: 04/19/22 Dr Danise Mina OV: annual - no changes. MMSE 24/30 - mild memory loss. F/u 3 months - memory.  Recent consult visits: 03/06/22 NP Cecilie Kicks (Cardiology): preop clearance, acceptable risk.  03/02/22 Dr Cruzita Lederer (Endocrine): DM - A1c 7.3%; insulin compliance is main issue - not taking Novolog in AM or out to dinner; CGM - TIR 34%, GMI 8.6%; BG is swinging widely, cannot be cleared for knee surgery. No changes to prescribed regimen, just improve compliance.  Hospital visits: None in previous 6 months   Objective:  Lab Results  Component Value Date   CREATININE 0.93 04/14/2022   BUN 15 04/14/2022   GFR 81.27 04/14/2022   GFRNONAA >60 03/02/2020   GFRAA >60 03/02/2020   NA 140 04/14/2022   K 3.6 04/14/2022   CALCIUM 9.1 04/14/2022   CO2 28 04/14/2022   GLUCOSE 178 (H) 04/14/2022    Lab Results  Component Value Date/Time   HGBA1C 7.3 (A) 03/02/2022 01:17 PM   HGBA1C 7.0 (A) 12/27/2021 12:49 PM   HGBA1C 7.7 (H) 03/02/2020 10:26 AM   HGBA1C 7.8 (H) 01/12/2020 10:21 AM   FRUCTOSAMINE 314 (H) 04/14/2022 10:55 AM   GFR 81.27 04/14/2022 10:54 AM   GFR 68.45 03/08/2021 03:08 PM   MICROALBUR 6.6 (H) 04/14/2022 10:54 AM   MICROALBUR 6.3 (H) 06/28/2015 09:39 AM    Last diabetic Eye exam:  Lab Results  Component Value Date/Time   HMDIABEYEEXA No Retinopathy 09/23/2021 12:00 AM   HMDIABEYEEXA No Retinopathy 09/23/2021 12:00 AM    Last diabetic Foot exam:  Lab Results  Component Value Date/Time   HMDIABFOOTEX done by podiatry - in chart 05/19/2019 12:00 AM  Lab Results  Component Value Date   CHOL 128 04/14/2022   HDL 40.80 04/14/2022   LDLCALC 64 04/14/2022   LDLDIRECT 94.0 10/05/2016   TRIG 115.0 04/14/2022   CHOLHDL 3 04/14/2022       Latest Ref Rng & Units 04/14/2022   10:54 AM 08/08/2021    3:54 PM 03/08/2021    3:08 PM  Hepatic Function  Total Protein 6.0 - 8.3 g/dL 6.1   6.7   Albumin 3.5 - 5.2 g/dL 4.0   4.5   AST  0 - 37 U/L 13   12   ALT 0 - 53 U/L _0 Alk Phosphatase 39 - 117 U/L 83   99   Total Bilirubin 0.2 - 1.2 mg/dL 1.0   1.0     Lab Results  Component Value Date/Time   TSH 2.05 04/14/2022 10:55 AM   TSH 2.83 03/08/2021 03:08 PM   FREET4 0.88 06/26/2014 09:05 AM       Latest Ref Rng & Units 02/24/2022    4:09 PM 03/02/2020   10:26 AM 01/05/2017   12:28 PM  CBC  WBC 3.8 - 10.8 Thousand/uL 7.0  8.5  5.7   Hemoglobin 13.2 - 17.1 g/dL 13.7  14.5  14.2   Hematocrit 38.5 - 50.0 % 40.7  42.8  41.3   Platelets 140 - 400 Thousand/uL 230  227  209.0     No results found for: "VD25OH"  Clinical ASCVD: Yes  The ASCVD Risk score (Arnett DK, et al., 2019) failed to calculate for the following reasons:   The valid total cholesterol range is 130 to 320 mg/dL       04/13/2022    2:09 PM 02/16/2021    9:27 AM 02/15/2021    1:09 PM  Depression screen PHQ 2/9  Decreased Interest 0 0 0  Down, Depressed, Hopeless 0 0 0  PHQ - 2 Score 0 0 0     Social History   Tobacco Use  Smoking Status Former   Packs/day: 0.30   Years: 50.00   Total pack years: 15.00   Types: Cigarettes   Quit date: 08/29/2011   Years since quitting: 10.9  Smokeless Tobacco Never   BP Readings from Last 3 Encounters:  08/14/22 (!) 148/83  04/19/22 124/72  03/06/22 138/70   Pulse Readings from Last 3 Encounters:  04/19/22 78  03/06/22 72  03/02/22 73   Wt Readings from Last 3 Encounters:  04/19/22 224 lb 2 oz (101.7 kg)  04/13/22 220 lb (99.8 kg)  03/06/22 220 lb 12.8 oz (100.2 kg)   BMI Readings from Last 3 Encounters:  04/19/22 31.70 kg/m  04/13/22 29.84 kg/m  03/06/22 29.95 kg/m    Assessment/Interventions: Review of patient past medical history, allergies, medications, health status, including review of consultants reports, laboratory and other test data, was performed as part of comprehensive evaluation and provision of chronic care management services.   SDOH:  (Social Determinants of  Health) assessments and interventions performed: No SDOH Interventions    Flowsheet Row Chronic Care Management from 02/15/2021 in [place] at [place] Management from 01/20/2021 in [place] at [place] Management from 10/15/2020 in [place] at [place] Management from 08/25/2020 in [place] at [place] Management from 07/06/2020 in [place] at [place] from 01/12/2020 in [place] at [place]  SDOH Interventions  Food Insecurity Interventions Intervention Not Indicated -- -- -- -- --  Housing Interventions Intervention Not Indicated -- -- -- -- --  Transportation Interventions Intervention Not Indicated -- -- -- -- --  Depression Interventions/Treatment  -- -- -- -- -- PHQ2-9 Score <4 Follow-up Not Indicated  Financial Strain Interventions -- Other (Comment)  [Apply for Ozempic assistance in coverage gap] Intervention Not Indicated  [Medications affordable] Intervention Not Indicated Other (Comment)  [patient assistance, coupon] --      SDOH Screenings   Food Insecurity: No Food Insecurity (04/13/2022)  Housing: Low Risk  (02/16/2021)  Transportation Needs: No Transportation Needs (04/13/2022)  Alcohol Screen: Low Risk  (01/12/2020)  Depression (PHQ2-9): Low Risk  (04/13/2022)  Financial Resource Strain: Low Risk  (04/13/2022)  Physical Activity: Inactive (04/13/2022)  Stress: No Stress Concern Present (04/13/2022)  Tobacco Use: Medium Risk (08/14/2022)    [place]  No Known Allergies  Medications Reviewed Today     Reviewed by Marzetta Board, DPM (Physician) on 08/14/22 at 1054  Med List Status: <None>   Medication Order Taking? Sig Documenting Provider Last Dose Status Informant  amLODipine (NORVASC) 5 MG tablet [id number] No Take 1 tablet (5 mg total) by mouth daily. [name], MD Taking Active   b complex vitamins  tablet [id number] No Take 1 tablet by mouth daily. [provider] Taking Active Self  Continuous Blood Gluc Sensor ([place]) [place] [id number] No by Does not apply route. [provider] Taking Active   Dextrose, Diabetic Use, (GLUCOSE PO) [id number] No Take 1 tablet by mouth as needed. [provider] Taking Active Self  glucose blood (ONETOUCH ULTRA) test strip [id number] No USE TO check blood glucose four times daily AS DIRECTED [name], MD Taking Active   GVOKE HYPOPEN 1-PACK 1 MG/0.2ML [place] Palmer [id number] No Inject under skin 0.2 mL as needed for hypoglycemia [name], MD Taking Active   insulin degludec (TRESIBA FLEXTOUCH) 200 UNIT/ML FlexTouch Pen [id number] No inject 32 units into THE SKIN DAILY [name], MD Taking Active   Insulin Pen Needle (ADVOCATE INSULIN PEN NEEDLES) 31G X 5 MM MISC [id number] No Use 3x a day [name], MD Taking Active Self  metFORMIN (GLUCOPHAGE-XR) 500 MG 24 hr tablet [id number] No Take 1 tablet (500 mg total) by mouth daily with breakfast. [name], MD Taking Active   NOVOLOG FLEXPEN 100 UNIT/ML FlexPen [id number] No INJECT 10-18 UNITS into THE SKIN THREE TIMES DAILY with meals [name], MD Taking Active   olmesartan-hydrochlorothiazide (BENICAR HCT) 40-25 MG tablet [id number] No Take 1 tablet by mouth daily. [name], MD Taking Active   OZEMPIC, 1 MG/DOSE, 4 MG/3ML SOPN [id number] No Inject 1 mg into the skin once a week. [name], MD Taking Active            Med Note Rolan Bucco Jun 22, 2022 12:27 PM) Patient stopped taking on his own, advised to restart 06/22/22 and pt agreed  pantoprazole (PROTONIX) 40 MG tablet [id number] No Take 1 tablet (40 mg total) by mouth daily. [name], MD Taking Active   simvastatin (ZOCOR) 20 MG tablet [id number] No TAKE 1 TABLET BY MOUTH EVERYDAY AT BEDTIME [name], MD Taking Active   tadalafil (CIALIS) 20 MG  tablet [id number] No TAKE 1/2 TO 1 TABLET BY MOUTH every other DAY AS NEEDED FOR erectile dysfunction [name], MD Taking Active             Patient Active Problem List  Diagnosis Date Noted   Gross hematuria 04/19/2022   Pre-operative exam 02/25/2022   Primary osteoarthritis of right knee 01/17/2022   Primary osteoarthritis of left knee 12/27/2021   Type 2 diabetes mellitus with hypoglycemia ([place]) 03/10/2021   MCI (mild cognitive impairment) with memory loss 02/10/2021   Non-adherence to medical treatment 10/27/2020   COPD (chronic obstructive pulmonary disease) ([place]) 02/28/2020   Coronary artery calcification seen on CAT scan 02/28/2020   Aortic atherosclerosis ([place]) 02/28/2020   Ex-smoker 01/21/2020   Numbness and tingling in left arm 01/21/2020   Health maintenance examination 01/05/2017   Barrett's esophagus 12/08/2015   Abnormal tympanic membrane of right ear 07/05/2015   Advanced care planning/counseling discussion 06/30/2014   Medicare annual wellness visit, subsequent 06/22/2013   Benign prostatic hyperplasia with urinary obstruction 06/18/2013   ED (erectile dysfunction) 04/10/2012   Hyperlipidemia associated with type 2 diabetes mellitus ([place]) 12/29/2008   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 ([place]) 07/24/2007   GERD 07/24/2007    Immunization History  Administered Date(s) Administered   Fluad Quad(high Dose 65+) 06/21/2020   Influenza Whole 06/02/2008, 06/02/2009, 06/02/2010, 07/08/2011, 07/08/2012   Influenza, High Dose Seasonal PF 05/18/2017, 06/25/2018, 04/18/2019, 07/04/2021   Influenza,inj,Quad PF,6+ Mos 07/05/2015   Influenza-Unspecified 05/28/2013, 05/28/2014, 06/30/2016   PFIZER(Purple Top)SARS-COV-2 Vaccination 10/27/2019, 11/25/2019   Pneumococcal Conjugate-13 06/18/2013   Pneumococcal Polysaccharide-23 07/08/2011, 12/29/2016   Tetanus 06/18/2013   Zoster Recombinat (Shingrix) 05/18/2017, 01/14/2018    Zoster, Live 08/26/2014    Conditions to be addressed/monitored:  Hypertension, Hyperlipidemia, Diabetes, and Coronary Artery Disease  Care Plan : [place]  Updates made by Charlton Haws, [place] since 08/17/2022 12:00 AM     Problem: Hypertension, Hyperlipidemia, Diabetes, and Coronary Artery Disease   Priority: High     Long-Range Goal: Disease Management   Start Date: 11/23/2020  Expected End Date: 06/23/2023  This Visit's Progress: Not on track  Recent Progress: On track  Priority: High  Note:   Current Barriers:  Unable to achieve control of diabetes Does not always discuss medication concerns with prescriber  Pharmacist Clinical Goal(s):  Patient will adhere to plan to optimize therapeutic regimen for DM as evidenced by report of adherence to recommended medication management changes contact provider office for questions/concerns as evidenced notation of same in electronic health record through collaboration with PharmD and provider.   Interventions: 1:1 collaboration with [name], MD regarding development and update of comprehensive plan of care as evidenced by provider attestation and co-signature Inter-disciplinary care team collaboration (see longitudinal plan of care) Comprehensive medication review performed; medication list updated in electronic medical record  Diabetes (A1c goal <8%) -Not ideally controlled - A1c 7.3% (02/2022), A1c has not been accurate in comparison to CGM data which shows worse control; he stopped Ozempic for several months due to fear of side effects -Follows with endocrine (Dr Cruzita Lederer); hx severe hypoglycemia and memory impairment -DM dx 2006, insulin since 2008 -Current medications: Metformin 500 mg daily AM -  Appropriate, Effective, Safe, Accessible Novolog 08-11-17 u w meals - Appropriate, Query Effective Tresiba U200 32 units daily - Appropriate, Query Effective Ozempic 1 mg weekly -Appropriate, Query  Effective Gvoke Hypopen PRN - not used Dexcom G7 -Appropriate, Effective, Safe, Accessible -Medications previously tried: upset stomach on higher dose metformin  -Current meal patterns: eats 2 meals/day (late breakfast ~11 AM, early dinner ~ 5 PM) -Current exercise: tries to walk when he can -Educated on A1c and blood sugar goals; consequences of  high BG on wound healing/surgery outcomes -Reviewed efficacy/safety data for Ozempic, overwhelmingly benefits outweigh risks; he has not had side effects himself (other than appetite suppression) -Advised pt to restart Ozempic, he can take 1/2 dose if full dose is not tolerated (dial pen up halfway) -advised to schedule f/u with Dr Cruzita Lederer  Hypertension (BP goal <140/90) -Controlled - per OV readings -Current home readings: n/a; Denies hypotensive/hypertensive symptoms -Current treatment: Amlodipine 5 mg daily - Appropriate, Effective, Safe, Accessible Olmesartan-HCTZ 40-25 mg daily -Appropriate, Effective, Safe, Accessible -Medications previously tried: n/a  -Educated on BP goals and benefits of medications for prevention of heart attack, stroke and kidney damage; -Counseled to monitor BP at home periodically -Recommended to continue current medication  Hyperlipidemia: (LDL goal < 70) -Controlled - LDL 64 (03/2022) at goal -Hx aortic atherosclerosis -Current treatment: Simvastatin 20 mg daily HS -Appropriate, Effective, Safe, Accessible -Medications previously tried: n/a  -Educated on Cholesterol goals; Benefits of statin for ASCVD risk reduction; -Recommended to continue current medication  Health Maintenance -Hx COPD, stable period off inhalers -MCI w/ memory loss - pt missed 2 appts in Nov (endocrine and PCP) because he forgot about them. Advised he reschedule those appts  Patient Goals/Self-Care Activities Patient will:  - take medications as prescribed as evidenced by patient report and record review focus on medication adherence  by routine check glucose using Dexcom, document, and provide at future appointments check blood pressure periodically, document, and provide at future appointments     Medication Assistance: None required.  Patient affirms current coverage meets needs.  Compliance/Adherence/Medication fill history: Care Gaps: None  Star-Rating Drugs: Simvastatin - PDC 100% Metformin - PDC 0% (dose decreased 3>1 per day so pt has excess supply) Ozempic - PDC 25%; LF 01/09/22 x 28ds Olmesartan-HCTZ - PDC 99%  Medication Access: Within the past 30 days, how often has patient missed a dose of medication? 0 Is a pillbox or other method used to improve adherence? Yes  Factors that may affect medication adherence? lack of understanding of disease management Are meds synced by current pharmacy? Yes  Are meds delivered by current pharmacy? Yes  Does patient experience delays in picking up medications due to transportation concerns? No   [facility] Reviewed: Is patient disadvantaged to use [facility]?: No  Current Rx insurance plan: [employer] Name and location of Current pharmacy:  [facility] - [place], [place] - [address] [place] [address] Phone: [phone] Fax: [phone]  [facility] services reviewed with patient today?: Yes  VIAL medications (30-day, delivered 08/11/22) Simvastatin 20 mg Take 1 tablet every day at bedtime OneTouch Test strips - Use up to four times daily Tadalafil 20 mg - 1/2 to 1 tablet daily as needed- PRN use Olmesartan/HCTZ 40-25 mg Take 1 tablet by mouth every day Amlodipine 5 mg 1 tablet daily Pantoprazole 40 mg 1 tablet daily Novolog Flexpen Inject 10-18 units into the skin 3 times daily with meals Tresiba Flex 200 units- 32 units once daily in morning    Patient declined the following medications this month: Metformin 500 mg ER - take 1 tablet every morning (pt has excess supply as RX  was for 3 a day now takes 1) Ozempic 1 mg/dose - Inject once weekly - not going to take any more Gvoke Hypopen - 1 mg - Inject 0.2 mL as needed   Care Plan and Follow Up Patient Decision:  Patient agrees to Care Plan and Follow-up.  Plan:  Telephone follow up appointment with care management team member scheduled for:  3 months  Charlene Brooke, PharmD, BCACP Clinical Pharmacist [facility] at [facility] [phone]

## 2022-08-15 NOTE — Progress Notes (Signed)
  Subjective:  Patient ID: Anthony [name], male    DOB: 1946-01-19,  MRN: [id number]  Anthony [name] presents to clinic today for at risk foot care with history of diabetic neuropathy and callus(es) right lower extremity and painful thick toenails that are difficult to trim. Painful toenails interfere with ambulation. Aggravating factors include wearing enclosed shoe gear. Pain is relieved with periodic professional debridement. Painful calluses are aggravated when weightbearing with and without shoegear. Pain is relieved with periodic professional debridement.  Chief Complaint  Patient presents with   Nail Problem    DFC BS-170 A1C-6.5 PCP-Gutierrez PCP VST-Can not remember    New problem(s): None.   PCP is [name], MD.  No Known Allergies  Review of Systems: Negative except as noted in the HPI.  Objective: No changes noted in today's physical examination. Vitals:   08/14/22 1024  BP: (!) 148/83   Anthony [name] is a pleasant 76 y.o. male in NAD. AAO x 3.  Vascular Examination: CFT <3 seconds b/l LE. Palpable DP/PT pulses b/l LE. Digital hair sparse b/l. Skin temperature gradient WNL b/l. No pain with calf compression b/l. No edema noted b/l. No cyanosis or clubbing noted b/l LE.  Dermatological Examination: Pedal skin warm and supple b/l.  No open wounds b/l. No interdigital macerations. Toenails 1-5 b/l elongated, thickened, discolored with subungual debris. +Tenderness with dorsal palpation of nailplates. Hyperkeratotic lesion(s) submet head 1 right foot.  No erythema, no edema, no drainage, no fluctuance.  Musculoskeletal Examination: Muscle strength 5/5 to all lower extremity muscle groups bilaterally. HAV with bunion deformity noted b/l LE. Hammertoe deformity noted 2-5 b/l.  Neurological Examination: Protective sensation diminished with 10g monofilament b/l.  Assessment/Plan: 1. Pain due to onychomycosis of toenails of both feet   2. Callus   3.  Diabetic peripheral neuropathy associated with type 2 diabetes mellitus ([name])     No orders of the defined types were placed in this encounter.  -Consent given for treatment as described below: -Continue diabetic foot care principles: inspect feet daily, monitor glucose as recommended by PCP and/or Endocrinologist, and follow prescribed diet per PCP, Endocrinologist and/or dietician. -Continue supportive shoe gear daily. -Toenails 1-5 b/l were debrided in length and girth with sterile nail nippers and dremel without iatrogenic bleeding.  -Callus(es) submet head 1 right foot pared utilizing rotary bur without complication or incident. Total number pared =1. -Patient/POA to call should there be question/concern in the interim.   Return in about 3 months (around 11/13/2022).  Marzetta Board, DPM

## 2022-08-17 NOTE — Patient Instructions (Signed)
Visit Information  Phone number for Pharmacist: [phone]   Goals Addressed   None     Care Plan : [place]  Updates made by Charlton Haws, [place] since 08/17/2022 12:00 AM     Problem: Hypertension, Hyperlipidemia, Diabetes, and Coronary Artery Disease   Priority: High     Long-Range Goal: Disease Management   Start Date: 11/23/2020  Expected End Date: 06/23/2023  This Visit's Progress: Not on track  Recent Progress: On track  Priority: High  Note:   Current Barriers:  Unable to achieve control of diabetes Does not always discuss medication concerns with prescriber  Pharmacist Clinical Goal(s):  Patient will adhere to plan to optimize therapeutic regimen for DM as evidenced by report of adherence to recommended medication management changes contact provider office for questions/concerns as evidenced notation of same in electronic health record through collaboration with PharmD and provider.   Interventions: 1:1 collaboration with [name], MD regarding development and update of comprehensive plan of care as evidenced by provider attestation and co-signature Inter-disciplinary care team collaboration (see longitudinal plan of care) Comprehensive medication review performed; medication list updated in electronic medical record  Diabetes (A1c goal <8%) -Not ideally controlled - A1c 7.3% (02/2022), A1c has not been accurate in comparison to CGM data which shows worse control; he stopped Ozempic for several months due to fear of side effects -Follows with endocrine (Dr Cruzita Lederer); hx severe hypoglycemia and memory impairment -DM dx 2006, insulin since 2008 -Current medications: Metformin 500 mg daily AM -  Appropriate, Effective, Safe, Accessible Novolog 08-11-17 u w meals - Appropriate, Query Effective Tresiba U200 32 units daily - Appropriate, Query Effective Ozempic 1 mg weekly -Appropriate, Query Effective Gvoke Hypopen PRN - not used Dexcom G7  -Appropriate, Effective, Safe, Accessible -Medications previously tried: upset stomach on higher dose metformin  -Current meal patterns: eats 2 meals/day (late breakfast ~11 AM, early dinner ~ 5 PM) -Current exercise: tries to walk when he can -Educated on A1c and blood sugar goals; consequences of high BG on wound healing/surgery outcomes -Reviewed efficacy/safety data for Ozempic, overwhelmingly benefits outweigh risks; he has not had side effects himself (other than appetite suppression) -Advised pt to restart Ozempic, he can take 1/2 dose if full dose is not tolerated (dial pen up halfway) -advised to schedule f/u with Dr Cruzita Lederer  Hypertension (BP goal <140/90) -Controlled - per OV readings -Current home readings: n/a; Denies hypotensive/hypertensive symptoms -Current treatment: Amlodipine 5 mg daily - Appropriate, Effective, Safe, Accessible Olmesartan-HCTZ 40-25 mg daily -Appropriate, Effective, Safe, Accessible -Medications previously tried: n/a  -Educated on BP goals and benefits of medications for prevention of heart attack, stroke and kidney damage; -Counseled to monitor BP at home periodically -Recommended to continue current medication  Hyperlipidemia: (LDL goal < 70) -Controlled - LDL 64 (03/2022) at goal -Hx aortic atherosclerosis -Current treatment: Simvastatin 20 mg daily HS -Appropriate, Effective, Safe, Accessible -Medications previously tried: n/a  -Educated on Cholesterol goals; Benefits of statin for ASCVD risk reduction; -Recommended to continue current medication  Health Maintenance -Hx COPD, stable period off inhalers -MCI w/ memory loss - pt missed 2 appts in Nov (endocrine and PCP) because he forgot about them. Advised he reschedule those appts  Patient Goals/Self-Care Activities Patient will:  - take medications as prescribed as evidenced by patient report and record review focus on medication adherence by routine check glucose using Dexcom, document,  and provide at future appointments check blood pressure periodically, document, and provide at future appointments  Patient verbalizes understanding of instructions and care plan provided today and agrees to view in [place]. Active MyChart status and patient understanding of how to access instructions and care plan via MyChart confirmed with patient.    Telephone follow up appointment with pharmacy team member scheduled for: 3 months  Charlene Brooke, PharmD, [facility] Clinical Pharmacist [place] [facility] [phone]

## 2022-08-30 ENCOUNTER — Telehealth: Payer: Self-pay

## 2022-08-30 NOTE — Progress Notes (Signed)
Reason for Encounter: Medication coordination and delivery  Contacted patient on 08/30/2022 to discuss medications   Recent office visits:  None since last CCM contact  Recent consult visits:  08/14/22 Acquanetta Sit, DPM Pain due to onychomycosis of toenails of both feet Procedure: Toenails 1-5 b/l were debrided DM foot exam complete  Hospital visits:  None in previous 6 months  Medications: Outpatient Encounter Medications as of 08/30/2022  Medication Sig Note   amLODipine (NORVASC) 5 MG tablet Take 1 tablet (5 mg total) by mouth daily.    b complex vitamins tablet Take 1 tablet by mouth daily.    Continuous Blood Gluc Sensor (DEXCOM G7 SENSOR) MISC by Does not apply route.    Dextrose, Diabetic Use, (GLUCOSE PO) Take 1 tablet by mouth as needed.    glucose blood (ONETOUCH ULTRA) test strip USE TO check blood glucose four times daily AS DIRECTED    GVOKE HYPOPEN 1-PACK 1 MG/0.2ML SOAJ Inject under skin 0.2 mL as needed for hypoglycemia    insulin degludec (TRESIBA FLEXTOUCH) 200 UNIT/ML FlexTouch Pen inject 32 units into THE SKIN DAILY    Insulin Pen Needle (ADVOCATE INSULIN PEN NEEDLES) 31G X 5 MM MISC Use 3x a day    metFORMIN (GLUCOPHAGE-XR) 500 MG 24 hr tablet Take 1 tablet (500 mg total) by mouth daily with breakfast.    NOVOLOG FLEXPEN 100 UNIT/ML FlexPen INJECT 10-18 UNITS into THE SKIN THREE TIMES DAILY with meals    olmesartan-hydrochlorothiazide (BENICAR HCT) 40-25 MG tablet Take 1 tablet by mouth daily.    OZEMPIC, 1 MG/DOSE, 4 MG/3ML SOPN Inject 1 mg into the skin once a week. 06/22/2022: Patient stopped taking on his own, advised to restart 06/22/22 and pt agreed   pantoprazole (PROTONIX) 40 MG tablet Take 1 tablet (40 mg total) by mouth daily.    simvastatin (ZOCOR) 20 MG tablet TAKE 1 TABLET BY MOUTH EVERYDAY AT BEDTIME    tadalafil (CIALIS) 20 MG tablet TAKE 1/2 TO 1 TABLET BY MOUTH every other DAY AS NEEDED FOR erectile dysfunction    No facility-administered  encounter medications on file as of 08/30/2022.   BP Readings from Last 3 Encounters:  08/14/22 (!) 148/83  04/19/22 124/72  03/06/22 138/70    Pulse Readings from Last 3 Encounters:  04/19/22 78  03/06/22 72  03/02/22 73    Lab Results  Component Value Date/Time   HGBA1C 7.3 (A) 03/02/2022 01:17 PM   HGBA1C 7.0 (A) 12/27/2021 12:49 PM   HGBA1C 7.7 (H) 03/02/2020 10:26 AM   HGBA1C 7.8 (H) 01/12/2020 10:21 AM   Lab Results  Component Value Date   CREATININE 0.93 04/14/2022   BUN 15 04/14/2022   GFR 81.27 04/14/2022   GFRNONAA >60 03/02/2020   GFRAA >60 03/02/2020   NA 140 04/14/2022   K 3.6 04/14/2022   CALCIUM 9.1 04/14/2022   CO2 28 04/14/2022     Last adherence delivery date: 08/11/2022      Patient is due for next adherence delivery on: 09/11/2022  Spoke with patient on 08/31/2022 reviewed medications and coordinated delivery.  VIAL medications: Simvastatin 20 mg Take 1 tablet every day at bedtime OneTouch Test strips - Use up to four times daily Tadalafil 20 mg - 1/2 to 1 tablet daily as needed- PRN use Olmesartan/HCTZ 40-25 mg Take 1 tablet by mouth every day Amlodipine 5 mg 1 tablet daily Pantoprazole 40 mg 1 tablet daily Novolog Flexpen Inject 10-18 units into the skin 3 times daily with meals [name]  Flex 200 units- 32 units once daily in morning     Any concerns about your medications? No   How often do you forget or accidentally miss a dose? Never   Do you use a pillbox? No   Is patient in packaging No  No refill request needed.  Confirmed delivery date of 09/11/2022, advised patient that pharmacy will contact them the morning of delivery.   Recent blood pressure readings are as follows: Patient does not take blood pressure at home.    Recent blood glucose readings are as follows: Patient was not at home for readings.    Cycle dispensing form sent to [facility] for review.  [name], CMA

## 2022-08-31 NOTE — Progress Notes (Signed)
  Care Coordination Note  08/31/2022 Name: DALON REICHART MRN: [id number] DOB: 23-Dec-1945  LOGYN KENDRICK is a 77 y.o. year old male who is a primary care patient of [name], MD and is actively engaged with the care management team. I reached out to [name] by phone today to assist with re-scheduling a follow up visit with the RN Case Manager  Follow up plan: Telephone appointment with care management team member scheduled for:09/04/2022  Julian Hy, [place] Direct Dial: [phone]

## 2022-09-04 ENCOUNTER — Ambulatory Visit: Payer: Self-pay

## 2022-09-04 NOTE — Patient Outreach (Signed)
  Care Coordination   Follow Up Visit Note   09/04/2022 Name: Anthony [name] MRN: [id number] DOB: [birth year]  Anthony [name] is a 77 y.o. year old male who sees [name], MD for primary care. I spoke with  [name] by phone today.  What matters to the patients health and wellness today?  Management of blood sugars and medication clarification    Goals Addressed             This Visit's Progress    Patient Stated: Controlling my blood sugars       Care Coordination Interventions: Evaluation of current treatment plan related to Diabetes and patient's adherence to plan as established by provider:  Patient states he feels he is doing well overall. Patient states he thought the supplier of his [employer] sensor was going to send out his sensors automatically but reports finding out today that he had to call and request new ones.  He states his last sensor expires today.  Patient states the sensors will not be sent out until 09/06/22.  He states he has a glucometer and supplies to test until received.  Patient reports fasting blood sugar today was 240 and last night was 309. Patient states he still has occasional low blood sugar readings in 80's and 90's and he treats with glucose tablets or glucose shot.   Reviewed medications with patient and discussed importance of medication adherence:  Patient states he is not currently taking his Ozempic as advised.  Patient states practice pharmacist informed him he could take 1/2 dose of his Ozempic.  Confirmed patient has glucometer and supplies.  Advised to use manual glucometer to check blood sugars while waiting on sensors to be mailed  Patient advised to discuss with primary provider 2nd opinion endocrinology option Reviewed scheduled/upcoming provider appointments:  Per chart review patient scheduled for endocrinology follow up visit on 10/06/22.  Discussed hyper/hypoglycemia treatment Contacted Charlene Brooke, practice pharmacist  to confirm patients Ozempic dosage.           SDOH assessments and interventions completed:  No     Care Coordination Interventions:  Yes, provided   Follow up plan: Follow up call scheduled for 10/09/22    Encounter Outcome:  Pt. Visit Completed   Quinn Plowman RN,BSN,CCM [place] [phone] direct line

## 2022-09-07 ENCOUNTER — Other Ambulatory Visit: Payer: Self-pay | Admitting: Internal Medicine

## 2022-09-07 ENCOUNTER — Ambulatory Visit: Payer: Medicare HMO | Admitting: Pharmacist

## 2022-09-07 NOTE — Progress Notes (Signed)
Care Management & Coordination Services Pharmacy Note  09/07/2022 Name:  [name] MRN:  [id number] DOB:  [birth year]  Summary: F/U visit -DM: A1c 7.3% (02/2022), though query accuracy of A1c as CGM showed significantly worse control (GMI 8.6%); pt stopped Ozempic for several months due to fear of side effects, he says full 1 mg dose is "too much" for him -Pt is not currently wearing Dexcom because his sensors ran out and he is awaiting delivery of refills. He is monitoring BG several times daily -Today: FBG 212, took [name] 32 u. Novolog 12 units w/ BF. BG 286 mid-morning. BG 227 2pm. Denies low BG over past couple weeks. -MCI: pt missed 2 appts in Nov (PCP and endocrine) because he forgot about them, advised he reschedule these appts   Recommendations/Changes made from today's visit: -Advised to resume Ozempic, he can take 1/2 dose (dial pen up half-way) -Rescheduled missed PCP appt  Follow up plan: -[place] will call patient 2 months for DM update -Pharmacist follow up televisit scheduled for 3 months -PCP appt 09/15/22; Endocrine appt 10/06/22    Subjective: Anthony [name] is an 77 y.o. year old male who is a primary patient of Anthony Bush, MD.  The care coordination team was consulted for assistance with disease management and care coordination needs.    Engaged with patient by telephone for follow up visit.  Recent office visits: 04/19/22 Dr Danise Mina OV: annual - no changes. MMSE 24/30 - mild memory loss. F/u 3 months - memory.   Recent consult visits: 03/06/22 NP Cecilie Kicks (Cardiology): preop clearance, acceptable risk.   03/02/22 Dr Cruzita Lederer (Endocrine): DM - A1c 7.3%; insulin compliance is main issue - not taking Novolog in AM or out to dinner; CGM - TIR 34%, GMI 8.6%; BG is swinging widely, cannot be cleared for knee surgery. No changes to prescribed regimen, just improve compliance.  Hospital visits: None in previous 6  months   Objective:  Lab Results  Component Value Date   CREATININE 0.93 04/14/2022   BUN 15 04/14/2022   GFR 81.27 04/14/2022   GFRNONAA >60 03/02/2020   GFRAA >60 03/02/2020   NA 140 04/14/2022   K 3.6 04/14/2022   CALCIUM 9.1 04/14/2022   CO2 28 04/14/2022   GLUCOSE 178 (H) 04/14/2022    Lab Results  Component Value Date/Time   HGBA1C 7.3 (A) 03/02/2022 01:17 PM   HGBA1C 7.0 (A) 12/27/2021 12:49 PM   HGBA1C 7.7 (H) 03/02/2020 10:26 AM   HGBA1C 7.8 (H) 01/12/2020 10:21 AM   FRUCTOSAMINE 314 (H) 04/14/2022 10:55 AM   GFR 81.27 04/14/2022 10:54 AM   GFR 68.45 03/08/2021 03:08 PM   MICROALBUR 6.6 (H) 04/14/2022 10:54 AM   MICROALBUR 6.3 (H) 06/28/2015 09:39 AM    Last diabetic Eye exam:  Lab Results  Component Value Date/Time   HMDIABEYEEXA No Retinopathy 09/23/2021 12:00 AM   HMDIABEYEEXA No Retinopathy 09/23/2021 12:00 AM    Last diabetic Foot exam:  Lab Results  Component Value Date/Time   HMDIABFOOTEX done by podiatry - in chart 05/19/2019 12:00 AM     Lab Results  Component Value Date   CHOL 128 04/14/2022   HDL 40.80 04/14/2022   LDLCALC 64 04/14/2022   LDLDIRECT 94.0 10/05/2016   TRIG 115.0 04/14/2022   CHOLHDL 3 04/14/2022       Latest Ref Rng & Units 04/14/2022   10:54 AM 08/08/2021    3:54 PM 03/08/2021    3:08 PM  Hepatic Function  Total Protein 6.0 - 8.3 g/dL 6.1   6.7   Albumin 3.5 - 5.2 g/dL 4.0   4.5   AST 0 - 37 U/L 13   12   ALT 0 - 53 U/L '14  14  13   '$ Alk Phosphatase 39 - 117 U/L 83   99   Total Bilirubin 0.2 - 1.2 mg/dL 1.0   1.0     Lab Results  Component Value Date/Time   TSH 2.05 04/14/2022 10:55 AM   TSH 2.83 03/08/2021 03:08 PM   FREET4 0.88 06/26/2014 09:05 AM       Latest Ref Rng & Units 02/24/2022    4:09 PM 03/02/2020   10:26 AM 01/05/2017   12:28 PM  CBC  WBC 3.8 - 10.8 Thousand/uL 7.0  8.5  5.7   Hemoglobin 13.2 - 17.1 g/dL 13.7  14.5  14.2   Hematocrit 38.5 - 50.0 % 40.7  42.8  41.3   Platelets 140 - 400  Thousand/uL 230  227  209.0     Lab Results  Component Value Date/Time   VITAMINB12 707 03/08/2021 03:08 PM    Clinical ASCVD: Yes  The ASCVD Risk score (Arnett DK, et al., 2019) failed to calculate for the following reasons:   The valid total cholesterol range is 130 to 320 mg/dL       04/13/2022    2:09 PM 02/16/2021    9:27 AM 02/15/2021    1:09 PM  Depression screen PHQ 2/9  Decreased Interest 0 0 0  Down, Depressed, Hopeless 0 0 0  PHQ - 2 Score 0 0 0     Social History   Tobacco Use  Smoking Status Former   Packs/day: 0.30   Years: 50.00   Total pack years: 15.00   Types: Cigarettes   Quit date: 08/29/2011   Years since quitting: 11.0  Smokeless Tobacco Never   BP Readings from Last 3 Encounters:  08/14/22 (!) 148/83  04/19/22 124/72  03/06/22 138/70   Pulse Readings from Last 3 Encounters:  04/19/22 78  03/06/22 72  03/02/22 73   Wt Readings from Last 3 Encounters:  04/19/22 224 lb 2 oz (101.7 kg)  04/13/22 220 lb (99.8 kg)  03/06/22 220 lb 12.8 oz (100.2 kg)   BMI Readings from Last 3 Encounters:  04/19/22 31.70 kg/m  04/13/22 29.84 kg/m  03/06/22 29.95 kg/m    No Known Allergies  Medications Reviewed Today     Reviewed by Charlton Haws, [facility] (Pharmacist) on 09/07/22 at 17  Med List Status: <None>   Medication Order Taking? Sig Documenting Provider Last Dose Status Informant  amLODipine (NORVASC) 5 MG tablet [id number] Yes Take 1 tablet (5 mg total) by mouth daily. Anthony Bush, MD Taking Active   b complex vitamins tablet [id number] Yes Take 1 tablet by mouth daily. [provider] Taking Active Self  Continuous Blood Gluc Sensor ([place]) [place] [id number] Yes by Does not apply route. [provider] Taking Active   Dextrose, Diabetic Use, (GLUCOSE PO) [id number] Yes Take 1 tablet by mouth as needed. [provider] Taking Active Self  glucose blood (ONETOUCH ULTRA) test strip [id number] Yes USE TO  check blood glucose four times daily AS DIRECTED [name], MD Taking Active   GVOKE HYPOPEN 1-PACK 1 MG/0.2ML Darden Palmer [id number] Yes Inject under skin 0.2 mL as needed for hypoglycemia [name], MD Taking Active   insulin degludec (TRESIBA FLEXTOUCH) 200 UNIT/ML FlexTouch Pen [id number] Yes inject 32  units into THE SKIN DAILY [name], MD Taking Active   Insulin Pen Needle (ADVOCATE INSULIN PEN NEEDLES) 31G X 5 MM MISC [id number] Yes Use 3x a day [name], MD Taking Active Self  metFORMIN (GLUCOPHAGE-XR) 500 MG 24 hr tablet [id number] Yes Take 1 tablet (500 mg total) by mouth daily with breakfast. [name], MD Taking Active   NOVOLOG FLEXPEN 100 UNIT/ML FlexPen [id number] Yes INJECT 10-18 UNITS into THE SKIN THREE TIMES DAILY with meals [name], MD Taking Active   olmesartan-hydrochlorothiazide (BENICAR HCT) 40-25 MG tablet [id number] Yes Take 1 tablet by mouth daily. Anthony Bush, MD Taking Active   OZEMPIC, 1 MG/DOSE, 4 MG/3ML SOPN [id number] No Inject 1 mg into the skin once a week.  Patient not taking: Reported on 09/07/2022   [name], MD Not Taking Active            Med Note Rolan Bucco Sep 07, 2022  2:32 PM)    pantoprazole (PROTONIX) 40 MG tablet [id number] Yes Take 1 tablet (40 mg total) by mouth daily. Anthony Bush, MD Taking Active   simvastatin (ZOCOR) 20 MG tablet [id number] Yes TAKE 1 TABLET BY MOUTH EVERYDAY AT BEDTIME Anthony Bush, MD Taking Active   tadalafil (CIALIS) 20 MG tablet [id number] Yes TAKE 1/2 TO 1 TABLET BY MOUTH every other DAY AS NEEDED FOR erectile dysfunction Anthony Bush, MD Taking Active             SDOH:  (Social Determinants of Health) assessments and interventions performed: Yes SDOH Interventions    [place] Coordination from 09/07/2022 in [place] Management from 02/15/2021 in [place] at [place]  Management from 01/20/2021 in [place] at [place] Management from 10/15/2020 in [place] at [place] Management from 08/25/2020 in [place] at [place] Management from 07/06/2020 in [place] at [place] Interventions -- Intervention Not Indicated -- -- -- --  Housing Interventions Intervention Not Indicated Intervention Not Indicated -- -- -- --  Transportation Interventions -- Intervention Not Indicated -- -- -- --  Financial Strain Interventions Intervention Not Indicated -- Other (Comment)  [Apply for Ozempic assistance in coverage gap] Intervention Not Indicated  [Medications affordable] Intervention Not Indicated Other (Comment)  [patient assistance, coupon]      SDOH Screenings   Food Insecurity: No Food Insecurity (04/13/2022)  Housing: Low Risk  (09/07/2022)  Transportation Needs: No Transportation Needs (04/13/2022)  Alcohol Screen: Low Risk  (01/12/2020)  Depression (PHQ2-9): Low Risk  (04/13/2022)  Financial Resource Strain: Low Risk  (09/07/2022)  Physical Activity: Inactive (04/13/2022)  Stress: No Stress Concern Present (04/13/2022)  Tobacco Use: Medium Risk (08/14/2022)    Medication Assistance: None required.  Patient affirms current coverage meets needs.  Medication Access: Within the past 30 days, how often has patient missed a dose of medication? 0 Is a pillbox or other method used to improve adherence? Yes  Factors that may affect medication adherence? lack of understanding of disease management Are meds synced by current pharmacy? Yes  Are meds delivered by current pharmacy? Yes  Does patient experience delays in picking up medications due to transportation concerns? No   [facility] Services Reviewed: Is patient disadvantaged to use [facility]?: No  Current Rx insurance plan: [employer] Name and location of Current pharmacy:  [place], [place] -  [address] [place] [address] Phone: [phone] Fax: [phone]  [id number] [place]: [phone]: [phone] [facility] Pharm[facility] services reviewed with patient today?: Yes  Simvastatin 20 mg Take 1 tablet every day at bedtime OneTouch Test strips - Use up to four times daily Tadalafil 20 mg - 1/2 to 1 tablet daily as needed- PRN use Olmesartan/HCTZ 40-25 mg Take 1 tablet by mouth every day Amlodipine 5 mg 1 tablet daily Pantoprazole 40 mg 1 tablet daily Novolog Flexpen Inject 10-18 units into the skin 3 times daily with meals Tresiba Flex 200 units- 32 units once daily in morning   Compliance/Adherence/Medication fill history: Care Gaps: None  Star-Rating Drugs: Simvastatin - PDC 99% Olmesartan-HCTZ - PDC 99% Metformin - PDC 0%; LF 11/2020 Ozempic - PDC 0%; LF 12/2021    Assessment/Plan  Diabetes (A1c goal <8%) -Not ideally controlled - A1c 7.3% (02/2022), A1c has not been accurate in comparison to CGM data which shows worse control; he stopped Ozempic for several months due to fear of side effects -Today: FBG 212. Tresiba 32 u. Novolog 12 units w/ BF. BG 286 mid-morning. BG 227 2pm. Denies low BG over past couple weeks. -Follows with endocrine (Dr GCruzita Lederer; hx severe hypoglycemia and memory impairment -DM dx 2006, insulin since 2008 -Current medications: Metformin 500 mg daily AM -  Appropriate, Effective, Safe, Accessible Novolog 08-11-17 u w meals - Appropriate, Query Effective Tresiba U200 32 units daily - Appropriate, Query Effective Ozempic 1 mg weekly -not taking Gvoke Hypopen PRN - not used Dexcom G7 -Appropriate, Effective, Safe, Accessible -Medications previously tried: upset stomach on higher dose metformin  -Current meal patterns: eats 2 meals/day (late breakfast ~11 AM, early dinner ~  5 PM) -Current exercise: tries to walk when he can -Educated on A1c and blood sugar goals; consequences of high BG on wound healing/surgery outcomes -Reviewed efficacy/safety data for Ozempic, overwhelmingly benefits outweigh risks; he has not had side effects himself (other than appetite suppression) -Advised pt to restart Ozempic, he can take 1/2 dose if full dose is not tolerated (dial pen up halfway)  Hypertension (BP goal <140/90) -Controlled - per OV readings -Current home readings: n/a; Denies hypotensive/hypertensive symptoms -Current treatment: Amlodipine 5 mg daily - Appropriate, Effective, Safe, Accessible Olmesartan-HCTZ 40-25 mg daily -Appropriate, Effective, Safe, Accessible -Medications previously tried: n/a  -Educated on BP goals and benefits of medications for prevention of heart attack, stroke and kidney damage; -Counseled to monitor BP at home periodically -Recommended to continue current medication  Hyperlipidemia: (LDL goal < 70) -Controlled - LDL 64 (03/2022) at goal -Hx aortic atherosclerosis -Current treatment: Simvastatin 20 mg daily HS - Appropriate, Effective, Safe, Accessible -Medications previously tried: n/a  -Educated on Cholesterol goals; Benefits of statin for ASCVD risk reduction; -Recommended to continue current medication  Health Maintenance -Hx COPD, stable period off inhalers -MCI w/ memory loss - pt missed 2 appts in Nov (endocrine and PCP) because he forgot about them. Advised he reschedule those appts   LCharlene Brooke PharmD, BCACP Clinical Pharmacist [phone]

## 2022-09-07 NOTE — Patient Instructions (Signed)
Visit Information  Phone number for Pharmacist: [phone]  Thank you for meeting with me to discuss your medications! Below is a summary of what we talked about during the visit:   Recommendations/Changes made from today's visit: -Advised to resume Ozempic, he can take 1/2 dose (dial pen up half-way) -Rescheduled missed PCP appt  Follow up plan: -Health Concierge will call patient 2 months for DM update -Pharmacist follow up televisit scheduled for 3 months -PCP appt 09/15/22; Endocrine appt 10/06/22   Charlene Brooke, PharmD, BCACP Clinical Pharmacist [facility] [phone]

## 2022-09-11 ENCOUNTER — Telehealth: Payer: Self-pay

## 2022-09-11 NOTE — Telephone Encounter (Signed)
Inbound call from DME supplier requesting form be completed and faxed with clinical notes. DME supplies ordered via Parachute through online portal.

## 2022-09-13 ENCOUNTER — Encounter: Payer: Self-pay | Admitting: Internal Medicine

## 2022-09-13 ENCOUNTER — Ambulatory Visit: Payer: Medicare HMO | Admitting: Internal Medicine

## 2022-09-13 VITALS — BP 128/80 | HR 76 | Ht 70.5 in | Wt 225.8 lb

## 2022-09-13 DIAGNOSIS — E785 Hyperlipidemia, unspecified: Secondary | ICD-10-CM | POA: Diagnosis not present

## 2022-09-13 DIAGNOSIS — E113299 Type 2 diabetes mellitus with mild nonproliferative diabetic retinopathy without macular edema, unspecified eye: Secondary | ICD-10-CM

## 2022-09-13 DIAGNOSIS — E663 Overweight: Secondary | ICD-10-CM

## 2022-09-13 DIAGNOSIS — Z794 Long term (current) use of insulin: Secondary | ICD-10-CM | POA: Diagnosis not present

## 2022-09-13 LAB — POCT GLYCOSYLATED HEMOGLOBIN (HGB A1C): Hemoglobin A1C: 7.2 % — AB (ref 4.0–5.6)
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
## 2022-09-13 NOTE — Patient Instructions (Addendum)
Please continue: - Metformin ER 500 mg daily in am - Ozempic 0.5 mg weekly - Tresiba 35 units daily in the morning - Novolog - 15 min before meals - 12 units before small meals - 15 units before regular meals - 18-20 units before larger meals  If the sugars are <90 before meals, take NovoLog at the start of the meal.  Please return to see me as needed. Try to see your primary care doctor for the diabetes.

## 2022-09-14 NOTE — Telephone Encounter (Signed)
Pt was seen in office and order was resubmitted via [employer].

## 2022-09-15 ENCOUNTER — Ambulatory Visit (INDEPENDENT_AMBULATORY_CARE_PROVIDER_SITE_OTHER): Payer: Medicare HMO | Admitting: Family Medicine

## 2022-09-15 ENCOUNTER — Telehealth: Payer: Self-pay | Admitting: *Deleted

## 2022-09-15 ENCOUNTER — Encounter: Payer: Self-pay | Admitting: Family Medicine

## 2022-09-15 VITALS — BP 136/80 | HR 83 | Temp 97.2°F | Ht 70.5 in | Wt 224.2 lb

## 2022-09-15 DIAGNOSIS — G3184 Mild cognitive impairment, so stated: Secondary | ICD-10-CM | POA: Diagnosis not present

## 2022-09-15 DIAGNOSIS — E11649 Type 2 diabetes mellitus with hypoglycemia without coma: Secondary | ICD-10-CM | POA: Diagnosis not present

## 2022-09-15 DIAGNOSIS — R69 Illness, unspecified: Secondary | ICD-10-CM | POA: Diagnosis not present

## 2022-09-15 DIAGNOSIS — Z23 Encounter for immunization: Secondary | ICD-10-CM

## 2022-09-15 DIAGNOSIS — Z638 Other specified problems related to primary support group: Secondary | ICD-10-CM | POA: Insufficient documentation

## 2022-09-15 DIAGNOSIS — Z794 Long term (current) use of insulin: Secondary | ICD-10-CM | POA: Diagnosis not present

## 2022-09-15 LAB — COMPREHENSIVE METABOLIC PANEL
ALT: 11 U/L (ref 0–53)
AST: 12 U/L (ref 0–37)
Albumin: 4.1 g/dL (ref 3.5–5.2)
Alkaline Phosphatase: 94 U/L (ref 39–117)
BUN: 22 mg/dL (ref 6–23)
CO2: 28 mEq/L (ref 19–32)
Calcium: 9.1 mg/dL (ref 8.4–10.5)
Chloride: 103 mEq/L (ref 96–112)
Creatinine, Ser: 0.98 mg/dL (ref 0.40–1.50)
GFR: 76.1 mL/min (ref >60.00)
Glucose, Bld: 279 mg/dL — ABNORMAL HIGH (ref 70–99)
Potassium: 4.1 mEq/L (ref 3.5–5.1)
Sodium: 139 mEq/L (ref 135–145)
Total Bilirubin: 0.6 mg/dL (ref 0.2–1.2)
Total Protein: 6.3 g/dL (ref 6.0–8.3)

## 2022-09-15 LAB — VITAMIN B12: Vitamin B-12: 418 pg/mL (ref 211–911)

## 2022-09-15 LAB — TSH: TSH: 1.97 u[IU]/mL (ref 0.35–5.50)

## 2022-09-15 MED ORDER — DONEPEZIL HCL 5 MG PO TABS: 5.0000 mg | ORAL_TABLET | Freq: Every day | ORAL | 6 refills | Status: DC

## 2022-09-15 NOTE — Assessment & Plan Note (Signed)
Chronic brittle diabetes, states discharged from endo care due to non-adherence to treatment plan. We will follow diabetes. No med changes today, encouraged taking novolog insulin 15 min prior to meals.

## 2022-09-15 NOTE — Progress Notes (Signed)
  Care Coordination   Note   09/15/2022 Name: Anthony [name] MRN: [id number] DOB: 1946/06/22  Anthony [name] is a 77 y.o. year old male who sees [name], MD for primary care. I reached out to [name] by phone today to offer care coordination services.  Mr. Fontan was given information about Care Coordination services today including:   The Care Coordination services include support from the care team which includes your Nurse Coordinator, Clinical Social Worker, or Pharmacist.  The Care Coordination team is here to help remove barriers to the health concerns and goals most important to you. Care Coordination services are voluntary, and the patient may decline or stop services at any time by request to their care team member.   Care Coordination Consent Status: Patient agreed to services and verbal consent obtained.   Follow up plan:  Telephone appointment with care coordination team member scheduled for:  09/20/2022  Encounter Outcome:  Pt. Scheduled from referral   Julian Hy, [place] Direct Dial: [phone]

## 2022-09-15 NOTE — Patient Instructions (Addendum)
Flu shot today  Labs today Start aricept memory medicine '5mg'$  nightly. Watch for palpitations, slowed heart rate, GI upset on this medicine.  Return in 3 months for diabetes follow up visit. We will follow diabetes for now.  I'd like to refer you to care management and pharmacist.

## 2022-09-15 NOTE — Assessment & Plan Note (Addendum)
Ongoing memory difficulty.  Memory labs today.  Start aricept '5mg'$  nightly. Reviewed side effects to watch for.

## 2022-09-15 NOTE — Progress Notes (Signed)
Patient ID: Anthony [name], male    DOB: 09-10-1945, 77 y.o.   MRN: [id number]  This visit was conducted in person.  BP 136/80   Pulse 83   Temp (!) 97.2 F (36.2 C) (Temporal)   Ht 5' 10.5" (1.791 m)   Wt 224 lb 4 oz (101.7 kg)   SpO2 96%   BMI 31.72 kg/m    CC: follow up visit  Subjective:   HPI: Anthony [name] is a 77 y.o. male presenting on 09/15/2022 for Medical Management of Chronic Issues (Here for memory f/u.)   Forgot to take meds this morning - overslept. States this is rare.   See prior note for details. Last seen 03/2022. At that time MMSE 24/30, CDT 3/4. Dx MCI with memory impairment. At that time we reviewed lifestyle strategies to support healthy mind, planned 3 mo f/u.  He continues caring for wife with memory difficulty as well. Chronic brittle diabetes contributes to memory difficulties.   DM - followed by endo Cruzita Lederer) last seen earlier this week - on insulin and GLP1RA and metformin. Significant difficulty with memory remembering to take daily insulin. Discharged from endo care - told to continue current regimen which is Metformin ER '500mg'$  daily in am, ozempic 0.'5mg'$  weekly, tresiba 35u daily, and novolog sliding scale 15 min before meal. Previous difficulty tolerating higher ozempic dose.  Lab Results  Component Value Date   HGBA1C 7.2 (A) 09/13/2022        Relevant past medical, surgical, family and social history reviewed and updated as indicated. Interim medical history since our last visit reviewed. Allergies and medications reviewed and updated. Outpatient Medications Prior to Visit  Medication Sig Dispense Refill   amLODipine (NORVASC) 5 MG tablet Take 1 tablet (5 mg total) by mouth daily. 90 tablet 3   b complex vitamins tablet Take 1 tablet by mouth daily.     Continuous Blood Gluc Sensor (DEXCOM G7 SENSOR) MISC by Does not apply route.     Dextrose, Diabetic Use, (GLUCOSE PO) Take 1 tablet by mouth as needed.     glucose blood (ONETOUCH  ULTRA) test strip USE TO check blood glucose four times daily AS DIRECTED 150 strip 3   GVOKE HYPOPEN 1-PACK 1 MG/0.2ML SOAJ Inject under skin 0.2 mL as needed for hypoglycemia 0.2 mL 11   insulin degludec (TRESIBA FLEXTOUCH) 200 UNIT/ML FlexTouch Pen inject 32 units into THE SKIN DAILY 27 mL 2   Insulin Pen Needle (ADVOCATE INSULIN PEN NEEDLES) 31G X 5 MM MISC Use 3x a day 300 each 3   metFORMIN (GLUCOPHAGE-XR) 500 MG 24 hr tablet TAKE ONE TABLET BY MOUTH EVERY MORNING 30 tablet 0   NOVOLOG FLEXPEN 100 UNIT/ML FlexPen INJECT 10-18 UNITS into THE SKIN THREE TIMES DAILY with meals 60 mL 1   olmesartan-hydrochlorothiazide (BENICAR HCT) 40-25 MG tablet Take 1 tablet by mouth daily. 90 tablet 3   OZEMPIC, 1 MG/DOSE, 4 MG/3ML SOPN Inject 1 mg into the skin once a week. 9 mL 3   pantoprazole (PROTONIX) 40 MG tablet Take 1 tablet (40 mg total) by mouth daily. 90 tablet 3   simvastatin (ZOCOR) 20 MG tablet TAKE 1 TABLET BY MOUTH EVERYDAY AT BEDTIME 90 tablet 3   tadalafil (CIALIS) 20 MG tablet TAKE 1/2 TO 1 TABLET BY MOUTH every other DAY AS NEEDED FOR erectile dysfunction 5 tablet 5   No facility-administered medications prior to visit.     Per HPI unless specifically indicated in ROS  section below Review of Systems  Objective:  BP 136/80   Pulse 83   Temp (!) 97.2 F (36.2 C) (Temporal)   Ht 5' 10.5" (1.791 m)   Wt 224 lb 4 oz (101.7 kg)   SpO2 96%   BMI 31.72 kg/m   Wt Readings from Last 3 Encounters:  09/15/22 224 lb 4 oz (101.7 kg)  09/13/22 225 lb 12.8 oz (102.4 kg)  04/19/22 224 lb 2 oz (101.7 kg)      Physical Exam Vitals and nursing note reviewed.  Constitutional:      Appearance: Normal appearance. He is not ill-appearing.  HENT:     Mouth/Throat:     Comments: Wearing mask Eyes:     Extraocular Movements: Extraocular movements intact.     Pupils: Pupils are equal, round, and reactive to light.  Cardiovascular:     Rate and Rhythm: Normal rate and regular rhythm.      Pulses: Normal pulses.     Heart sounds: Normal heart sounds. No murmur heard. Pulmonary:     Effort: Pulmonary effort is normal. No respiratory distress.     Breath sounds: Normal breath sounds. No wheezing, rhonchi or rales.  Musculoskeletal:     Right lower leg: No edema.     Left lower leg: No edema.  Skin:    General: Skin is warm and dry.  Neurological:     Mental Status: He is alert.  Psychiatric:        Mood and Affect: Mood normal.        Behavior: Behavior normal.       Results for orders placed or performed in visit on 09/13/22  POCT glycosylated hemoglobin (Hb A1C)  Result Value Ref Range   Hemoglobin A1C 7.2 (A) 4.0 - 5.6 %   HbA1c POC (<> result, manual entry)     HbA1c, POC (prediabetic range)     HbA1c, POC (controlled diabetic range)      Assessment & Plan:   Problem List Items Addressed This Visit     MCI (mild cognitive impairment) with memory loss    Ongoing memory difficulty.  Memory labs today.  Start aricept '5mg'$  nightly. Reviewed side effects to watch for.       Relevant Orders   Comprehensive metabolic panel   TSH   Vitamin B12   RPR   AMB Referral to [place] ([facility] Patients)   Type 2 diabetes mellitus with hypoglycemia ([name]) - Primary    Chronic brittle diabetes, states discharged from endo care due to non-adherence to treatment plan. We will follow diabetes. No med changes today, encouraged taking novolog insulin 15 min prior to meals.       Relevant Orders   Fructosamine   AMB Referral to [place] ([facility] Patients)   Caregiver role strain    Refer to care coordinator.  He already sees our pharmacy team.       Relevant Orders   AMB Referral to [facility] ([facility] Patients)   Other Visit Diagnoses     Need for influenza vaccination       Relevant Orders   Flu Vaccine QUAD High Dose(Fluad) (Completed)        Meds ordered this encounter  Medications   donepezil (ARICEPT) 5 MG tablet     Sig: Take 1 tablet (5 mg total) by mouth at bedtime.    Dispense:  30 tablet    Refill:  6    Orders Placed This Encounter  Procedures   Flu Vaccine QUAD High Dose(Fluad)   Comprehensive metabolic panel   Fructosamine   TSH   Vitamin B12   RPR   AMB Referral to [facility] ([facility] Patients)    Referral Priority:   Routine    Referral Type:   Consultation    Referral Reason:   Care Coordination    Number of Visits Requested:   1    Patient Instructions  Flu shot today  Labs today Start aricept memory medicine '5mg'$  nightly. Watch for palpitations, slowed heart rate, GI upset on this medicine.  Return in 3 months for diabetes follow up visit. We will follow diabetes for now.  I'd like to refer you to care management and pharmacist.   Follow up plan: Return in about 3 months (around 12/15/2022) for follow up visit.  [name], MD

## 2022-09-15 NOTE — Assessment & Plan Note (Addendum)
Refer to care coordinator.  He already sees our pharmacy team.

## 2022-09-18 DIAGNOSIS — Z794 Long term (current) use of insulin: Secondary | ICD-10-CM | POA: Diagnosis not present

## 2022-09-18 DIAGNOSIS — E113299 Type 2 diabetes mellitus with mild nonproliferative diabetic retinopathy without macular edema, unspecified eye: Secondary | ICD-10-CM | POA: Diagnosis not present

## 2022-09-19 LAB — FRUCTOSAMINE: Fructosamine: 284 umol/L (ref 205–285)

## 2022-09-19 LAB — RPR: RPR Ser Ql: NONREACTIVE

## 2022-09-20 ENCOUNTER — Ambulatory Visit: Payer: Self-pay | Admitting: *Deleted

## 2022-09-20 NOTE — Patient Instructions (Signed)
Visit Information  Thank you for taking time to visit with me today. Please don't hesitate to contact me if I can be of assistance to you.   Following are the goals we discussed today:   Goals Addressed             This Visit's Progress    Caregiver support       Care Coordination Interventions: 10/04/22 Phone call to patient to assess for community resource and caregiver support needs Patient's spouse discussed concern that spouse's condition is beginning to progress and is requesting support-providing care is beginning to be difficult Patient states that he is the primary caregiver for patient -per spouse, he assists patient with all of her care needs and transports her to all medical appointments-due to safety concerns and poor vision patient has stopped driving Discussed Day Program  options- specifically [facility] to support her social, physical and emotional well being as well as caregiver support and respite care.   Caregiver stress acknowledged-discussed considering a private duty aid to assist with ADL's or Adult Day Program  Depression screen reviewed  Solution-Focused Strategies employed:  Active listening / Reflection utilized  Emotional Support Provided Patient will consider options discussed and will follow up with next steps during next visit          Our next appointment is by telephone on 10/04/22 at 10am  Please call the care guide team at [phone] if you need to cancel or reschedule your appointment.   If you are experiencing a Mental Health or [place] or need someone to talk to, please call [phone]   Patient verbalizes understanding of instructions and care plan provided today and agrees to view in [place]. Active MyChart status and patient understanding of how to access instructions and care plan via MyChart confirmed with patient.     Telephone follow up appointment with care management team member scheduled for: 10/04/22  Elliot Gurney,  Reader Worker  [facility] Care Management [phone]

## 2022-09-20 NOTE — Patient Outreach (Addendum)
  Care Coordination   Initial Visit Note   09/20/2022 Name: Anthony [name] MRN: [id number] DOB: 1945-08-30  Anthony [name] is a 77 y.o. year old male who sees [name], MD for primary care. I spoke with  Anthony [name] by phone today.  What matters to the patients health and wellness today?  Caregiver support    Goals Addressed             This Visit's Progress    Caregiver support       Care Coordination Interventions: 10/04/22 Phone call to patient to assess for community resource and caregiver support needs Patient's spouse discussed concern that spouse's condition is beginning to progress and is requesting support-providing care is beginning to be difficult Patient states that he is the primary caregiver for patient -per spouse, he assists patient with all of her care needs and transports her to all medical appointments-due to safety concerns and poor vision patient has stopped driving Discussed Day Program  options- specifically [facility] to support her social, physical and emotional well being as well as caregiver support and respite care.   Caregiver stress acknowledged-discussed considering a private duty aid to assist with ADL's or Adult Day Program  Depression screen reviewed  Solution-Focused Strategies employed:  Active listening / Reflection utilized  Emotional Support Provided Patient will consider options discussed and will follow up with next steps during next visit          SDOH assessments and interventions completed:  Yes  SDOH Interventions Today    Flowsheet Row Most Recent Value  SDOH Interventions   Food Insecurity Interventions Intervention Not Indicated  Housing Interventions Intervention Not Indicated  Transportation Interventions Intervention Not Indicated        Care Coordination Interventions:  Yes, provided   Follow up plan: Follow up call scheduled for 10/04/22    Encounter Outcome:  Pt. Visit Completed

## 2022-09-24 ENCOUNTER — Other Ambulatory Visit: Payer: Self-pay | Admitting: Internal Medicine

## 2022-09-26 ENCOUNTER — Other Ambulatory Visit: Payer: Self-pay | Admitting: Family Medicine

## 2022-09-26 DIAGNOSIS — N529 Male erectile dysfunction, unspecified: Secondary | ICD-10-CM

## 2022-09-26 NOTE — Telephone Encounter (Signed)
Refill request Cialis Last refill 07/27/22 #5/5 Last office visit 09/15/22

## 2022-09-28 ENCOUNTER — Other Ambulatory Visit: Payer: Self-pay | Admitting: Internal Medicine

## 2022-09-28 DIAGNOSIS — I10 Essential (primary) hypertension: Secondary | ICD-10-CM | POA: Diagnosis not present

## 2022-09-28 DIAGNOSIS — Z7984 Long term (current) use of oral hypoglycemic drugs: Secondary | ICD-10-CM | POA: Diagnosis not present

## 2022-09-28 DIAGNOSIS — Z794 Long term (current) use of insulin: Secondary | ICD-10-CM | POA: Diagnosis not present

## 2022-09-28 DIAGNOSIS — E1165 Type 2 diabetes mellitus with hyperglycemia: Secondary | ICD-10-CM | POA: Diagnosis not present

## 2022-09-28 DIAGNOSIS — Z008 Encounter for other general examination: Secondary | ICD-10-CM | POA: Diagnosis not present

## 2022-09-28 DIAGNOSIS — Z7985 Long-term (current) use of injectable non-insulin antidiabetic drugs: Secondary | ICD-10-CM | POA: Diagnosis not present

## 2022-09-28 DIAGNOSIS — Z87891 Personal history of nicotine dependence: Secondary | ICD-10-CM | POA: Diagnosis not present

## 2022-09-28 DIAGNOSIS — Z833 Family history of diabetes mellitus: Secondary | ICD-10-CM | POA: Diagnosis not present

## 2022-09-28 DIAGNOSIS — E785 Hyperlipidemia, unspecified: Secondary | ICD-10-CM | POA: Diagnosis not present

## 2022-09-28 DIAGNOSIS — K219 Gastro-esophageal reflux disease without esophagitis: Secondary | ICD-10-CM | POA: Diagnosis not present

## 2022-09-29 ENCOUNTER — Telehealth: Payer: Self-pay

## 2022-09-29 ENCOUNTER — Other Ambulatory Visit (INDEPENDENT_AMBULATORY_CARE_PROVIDER_SITE_OTHER): Payer: Medicare HMO | Admitting: Podiatry

## 2022-09-29 DIAGNOSIS — M2012 Hallux valgus (acquired), left foot: Secondary | ICD-10-CM

## 2022-09-29 DIAGNOSIS — M2041 Other hammer toe(s) (acquired), right foot: Secondary | ICD-10-CM

## 2022-09-29 DIAGNOSIS — M2042 Other hammer toe(s) (acquired), left foot: Secondary | ICD-10-CM

## 2022-09-29 DIAGNOSIS — L84 Corns and callosities: Secondary | ICD-10-CM

## 2022-09-29 DIAGNOSIS — M2011 Hallux valgus (acquired), right foot: Secondary | ICD-10-CM

## 2022-09-29 DIAGNOSIS — E1142 Type 2 diabetes mellitus with diabetic polyneuropathy: Secondary | ICD-10-CM

## 2022-09-29 NOTE — Progress Notes (Signed)
Orders Placed This Encounter  Procedures   For Home Use Only DME Diabetic Shoe    Dispense one pair extra depth shoes and 3 pair custom insoles. Offload callus submet head 1 right foot.    1. Diabetic peripheral neuropathy associated with type 2 diabetes mellitus ([name])   2. Hallux valgus, acquired, bilateral   3. Acquired hammertoes of both feet   4. Callus

## 2022-09-29 NOTE — Progress Notes (Unsigned)
Care Management & Coordination Services Pharmacy Team  Reason for Encounter: Medication coordination and delivery  Contacted patient to discuss medications and coordinate delivery from [facility]. {US HC Outreach:28874} Cycle dispensing form sent to *** for review.   Last adherence delivery date:09/11/22      Patient is due for next adherence delivery on: 10/11/22  This delivery to include: Vials  30 Days  Easy Open Caps  Simvastatin 20 mg Take 1 tablet every day at bedtime OneTouch Test strips - Use up to four times daily Tadalafil 20 mg - 1/2 to 1 tablet daily as needed- PRN use Olmesartan/HCTZ 40-25 mg Take 1 tablet by mouth every day Amlodipine 5 mg 1 tablet daily Pantoprazole 40 mg 1 tablet daily Novolog Flexpen Inject 10-18 units into the skin 3 times daily with meals Tresiba Flex 200 units- 32 units once daily in morning Donepezil '5mg'$ - take 1 tablet bedtime  Metformin XR '500mg'$ -take 1 tablet in morning   Patient declined the following medications this month: ***  {refills needed:25320}  {Delivery BJSE:83151}   Any concerns about your medications? No  How often do you forget or accidentally miss a dose? Never  Do you use a pillbox? No  Is patient in packaging No    Recent blood pressure readings are as follows:none available   Recent blood glucose readings are as follows:***   Chart review: Recent office visits:  09/15/22-Javier Gutierrez,MD(PCP)-f/u memory,start aricept '5mg'$  nightly Labs(kidneys, liver, thyroid and vitamin B12 returned normal. Your sugar was high at 279. We are awaiting fructosamine levels )(A1c 7.2) flu shot given,f/u 3  months  Recent consult visits:  09/13/22-Cristina Gherghe,MD(endo)-f/u DM,Eye UTD,no medication changes f/u  4 months   Hospital visits:  None in previous 6 months  Medications: Outpatient Encounter Medications as of 09/29/2022  Medication Sig   amLODipine (NORVASC) 5 MG tablet Take 1 tablet (5 mg total) by mouth  daily.   b complex vitamins tablet Take 1 tablet by mouth daily.   Continuous Blood Gluc Sensor (DEXCOM G7 SENSOR) MISC by Does not apply route.   Dextrose, Diabetic Use, (GLUCOSE PO) Take 1 tablet by mouth as needed.   donepezil (ARICEPT) 5 MG tablet Take 1 tablet (5 mg total) by mouth at bedtime.   glucose blood (ONETOUCH ULTRA) test strip USE TO check blood glucose four times daily AS DIRECTED   GVOKE HYPOPEN 1-PACK 1 MG/0.2ML SOAJ Inject under skin 0.2 mL as needed for hypoglycemia   insulin aspart (NOVOLOG FLEXPEN) 100 UNIT/ML FlexPen Inject 12-20 Units into the skin 3 (three) times daily before meals.   insulin degludec (TRESIBA FLEXTOUCH) 200 UNIT/ML FlexTouch Pen inject 32 units into THE SKIN DAILY   Insulin Pen Needle (ADVOCATE INSULIN PEN NEEDLES) 31G X 5 MM MISC Use 3x a day   metFORMIN (GLUCOPHAGE-XR) 500 MG 24 hr tablet TAKE ONE TABLET BY MOUTH EVERY MORNING   olmesartan-hydrochlorothiazide (BENICAR HCT) 40-25 MG tablet Take 1 tablet by mouth daily.   OZEMPIC, 1 MG/DOSE, 4 MG/3ML SOPN Inject 1 mg into the skin once a week.   pantoprazole (PROTONIX) 40 MG tablet Take 1 tablet (40 mg total) by mouth daily.   simvastatin (ZOCOR) 20 MG tablet TAKE 1 TABLET BY MOUTH EVERYDAY AT BEDTIME   tadalafil (CIALIS) 20 MG tablet TAKE ONE-HALF TO 1 TABLET BY MOUTH EVERY OTHER DAY AS NEEDED   No facility-administered encounter medications on file as of 09/29/2022.   BP Readings from Last 3 Encounters:  09/15/22 136/80  09/13/22 128/80  08/14/22 [name] Kitchen)  148/83    Pulse Readings from Last 3 Encounters:  09/15/22 83  09/13/22 76  04/19/22 78    Lab Results  Component Value Date/Time   HGBA1C 7.2 (A) 09/13/2022 11:16 AM   HGBA1C 7.3 (A) 03/02/2022 01:17 PM   HGBA1C 7.7 (H) 03/02/2020 10:26 AM   HGBA1C 7.8 (H) 01/12/2020 10:21 AM   Lab Results  Component Value Date   CREATININE 0.98 09/15/2022   BUN 22 09/15/2022   GFR 76.10 09/15/2022   GFRNONAA >60 03/02/2020   GFRAA >60 03/02/2020   NA  139 09/15/2022   K 4.1 09/15/2022   CALCIUM 9.1 09/15/2022   CO2 28 09/15/2022     Charlene Brooke, PharmD notified  Avel Sensor, [place] Assistant [phone]

## 2022-10-03 ENCOUNTER — Ambulatory Visit (INDEPENDENT_AMBULATORY_CARE_PROVIDER_SITE_OTHER): Payer: Medicare HMO

## 2022-10-03 DIAGNOSIS — M2042 Other hammer toe(s) (acquired), left foot: Secondary | ICD-10-CM | POA: Diagnosis not present

## 2022-10-03 DIAGNOSIS — M2011 Hallux valgus (acquired), right foot: Secondary | ICD-10-CM | POA: Diagnosis not present

## 2022-10-03 DIAGNOSIS — M2012 Hallux valgus (acquired), left foot: Secondary | ICD-10-CM

## 2022-10-03 DIAGNOSIS — E1142 Type 2 diabetes mellitus with diabetic polyneuropathy: Secondary | ICD-10-CM

## 2022-10-03 DIAGNOSIS — M2041 Other hammer toe(s) (acquired), right foot: Secondary | ICD-10-CM | POA: Diagnosis not present

## 2022-10-03 NOTE — Progress Notes (Signed)
Pt in office today to pick up up diabetics shoes. Pt tried shoe and satisfied. Instructions were given.

## 2022-10-04 ENCOUNTER — Ambulatory Visit: Payer: Self-pay | Admitting: *Deleted

## 2022-10-04 NOTE — Patient Instructions (Signed)
Visit Information  Thank you for taking time to visit with me today. Please don't hesitate to contact me if I can be of assistance to you.   Following are the goals we discussed today:   Goals Addressed             This Visit's Progress    Caregiver support       Care Coordination Interventions:  Follow up phone call to patient to assess for community resource and caregiver support needs Discussed Day Program  options- specifically [facility] to support her social, physical and emotional well being as well as caregiver support and respite care.  -contact information provided as well as added patient to the list to receive newsletters related to services offered Continued to discuss considering a private duty aid to assist with ADL's as well Patient confirmed having no immediate needs at this time and will use resources provided if needed in the future           Please call the care guide team at [phone] if you need to cancel or reschedule your appointment.   If you are experiencing a Mental Health or [place] or need someone to talk to, please call 911   The patient verbalized understanding of instructions, educational materials, and care plan provided today and DECLINED offer to receive copy of patient instructions, educational materials, and care plan.   No further follow up required: patient to contact this Education officer, museum with any additional community resource needs  [employer], [place] Worker  [facility] Care Management [phone]

## 2022-10-04 NOTE — Patient Outreach (Signed)
  Care Coordination   Follow Up Visit Note   10/04/2022 Name: SOLMON BOHR MRN: [id number] DOB: Nov 10, 1945  KIRIL HIPPE is a 77 y.o. year old male who sees [name], MD for primary care. I spoke with  [name] by phone today.  What matters to the patients health and wellness today?  [employer] and caregiver support    Goals Addressed             This Visit's Progress    Caregiver support       Care Coordination Interventions:  Follow up phone call to patient to assess for community resource and caregiver support needs Discussed Day Program  options- specifically [facility] to support her social, physical and emotional well being as well as caregiver support and respite care.  -contact information provided as well as added patient to the list to receive newsletters related to services offered Continued to discuss considering a private duty aid to assist with ADL's as well Patient confirmed having no immediate needs at this time and will use resources provided if needed in the future         SDOH assessments and interventions completed:  No     Care Coordination Interventions:  Yes, provided   Interventions Today    Flowsheet Row Most Recent Value  General Interventions   General Interventions Discussed/Reviewed General Interventions Reviewed, Community Resources        Follow up plan: No further intervention required.   Encounter Outcome:  Pt. Visit Completed

## 2022-10-05 ENCOUNTER — Encounter: Payer: Self-pay | Admitting: Internal Medicine

## 2022-10-05 ENCOUNTER — Ambulatory Visit: Payer: Medicare HMO | Attending: Internal Medicine | Admitting: Internal Medicine

## 2022-10-05 VITALS — BP 152/82 | HR 75 | Ht 72.0 in | Wt 219.0 lb

## 2022-10-05 DIAGNOSIS — I700 Atherosclerosis of aorta: Secondary | ICD-10-CM | POA: Diagnosis not present

## 2022-10-05 DIAGNOSIS — I2510 Atherosclerotic heart disease of native coronary artery without angina pectoris: Secondary | ICD-10-CM | POA: Diagnosis not present

## 2022-10-05 DIAGNOSIS — E119 Type 2 diabetes mellitus without complications: Secondary | ICD-10-CM | POA: Diagnosis not present

## 2022-10-05 DIAGNOSIS — I10 Essential (primary) hypertension: Secondary | ICD-10-CM | POA: Insufficient documentation

## 2022-10-05 DIAGNOSIS — J449 Chronic obstructive pulmonary disease, unspecified: Secondary | ICD-10-CM | POA: Diagnosis not present

## 2022-10-05 DIAGNOSIS — E785 Hyperlipidemia, unspecified: Secondary | ICD-10-CM | POA: Diagnosis not present

## 2022-10-05 DIAGNOSIS — E1169 Type 2 diabetes mellitus with other specified complication: Secondary | ICD-10-CM

## 2022-10-05 MED ORDER — ASPIRIN 81 MG PO TBEC: 81.0000 mg | DELAYED_RELEASE_TABLET | Freq: Every day | ORAL | 12 refills | Status: AC

## 2022-10-05 MED ORDER — AMLODIPINE BESYLATE 10 MG PO TABS: 10.0000 mg | ORAL_TABLET | Freq: Every day | ORAL | 3 refills | Status: DC

## 2022-10-05 NOTE — Patient Instructions (Signed)
Medication Instructions:  Your physician has recommended you make the following change in your medication:   Increase amlodipine to 10 mg daily *If you need a refill on your cardiac medications before your next appointment, please call your pharmacy*  Testing/Procedures: Pulmonary Function test    Follow-Up: At [facility], you and your health needs are our priority.  As part of our continuing mission to provide you with exceptional heart care, we have created designated Provider Care Teams.  These Care Teams include your primary Cardiologist (physician) and Advanced Practice Providers (APPs -  Physician Assistants and Nurse Practitioners) who all work together to provide you with the care you need, when you need it.  We recommend signing up for the patient portal called "MyChart".  Sign up information is provided on this After Visit Summary.  MyChart is used to connect with patients for Virtual Visits (Telemedicine).  Patients are able to view lab/test results, encounter notes, upcoming appointments, etc.  Non-urgent messages can be sent to your provider as well.   To learn more about what you can do with MyChart, go to [email or web address].    Your next appointment:   1 year(s)  Provider:   Werner Lean, MD

## 2022-10-05 NOTE — Progress Notes (Signed)
Cardiology Office Note:    Date:  10/05/2022   ID:  Anthony [name], DOB 04/07/1946, MRN [id number]  PCP:  Anthony Bush, MD  [facility] Cardiologist: ,Anthony Haskell MD Anthony [name] Electrophysiologist:  None   CC: Shortness of breath follow up  History of Present Illness:    Anthony [name] is a 77 y.o. male with a hx of CAC, Aortic Atherosclerosis, DM with HTN, HLD, Erectile Dysfunction, COPD former smoker (age [age] 1 pack a week but wife smoked too) who presents for evaluation 08/31/20.   2022: patient had uncomplicated cataract surgery.  We had talked about possible stress testing at last visits.   2023: preoperative eval with no issues  Patient notes that he is doing ok.   Wife's dementia is progressively getting worse and starting to get paranoid. He is doing the cooking and his LDL is the best it has been. No chest pain or pressure.  He rarely gets winded with exertion and no PND/Orthopnea.  No weight gain or leg swelling.  No palpitations or syncope.  Ambulatory blood pressure not done.   Past Medical History:  Diagnosis Date   Aortic atherosclerosis ([place]) 02/28/2020   By CT   Barrett's esophagus 12/08/2015   By EGD 2010 Anthony [name])    BPH with obstruction/lower urinary tract symptoms 2014   s/p TURP   COPD (chronic obstructive pulmonary disease) ([place]) 02/28/2020   By CT: diffuse bronchial wall thickening with mild centrilobular and paraseptal emphysema (01/2020)   Coronary artery calcification seen on CAT scan 02/28/2020   3v CAD - calcified atherosclerotic plaque in the left main, left anterior descending and left circumflex coronary arteries (01/2020)   Diabetes mellitus type 2 with complications ([place])    completed DMSE   DKA (diabetic ketoacidoses) 01/2010   "in coma"   Ex-smoker 01/21/2020   GERD (gastroesophageal reflux disease)    HTN (hypertension)    Hyperlipidemia    controlled with medicine   Osteoarthritis    Rheumatic fever    maybe as  child    Past Surgical History:  Procedure Laterality Date   ABI  12/2013   WNL   APPENDECTOMY     CATARACT EXTRACTION W/PHACO Right 12/01/2020   Procedure: CATARACT EXTRACTION PHACO AND INTRAOCULAR LENS PLACEMENT ([place])  RIGHT VIVITY TORIC LENS DIABETIC;  Surgeon: Anthony Koyanagi, MD;  Location: [place];  Service: Ophthalmology;  Laterality: Right;  6.93 1:12.8 9.5%   CATARACT EXTRACTION W/PHACO Left 12/15/2020   Procedure: CATARACT EXTRACTION PHACO AND INTRAOCULAR LENS PLACEMENT ([place])  VIVITY TORIC LENS DIABETIC LEFT EYE;  Surgeon: Anthony Koyanagi, MD;  Location: [place];  Service: Ophthalmology;  Laterality: Left;  3.47 1:03.8 5.5%   CHOLECYSTECTOMY     years ago   COLONOSCOPY  01/2009   TA, rec rpt 5 yrs Anthony [name])   COLONOSCOPY  01/2018   TAx2, diverticulosis, int hemorrhoids, rpt 5 yrs ([name])   ESOPHAGOGASTRODUODENOSCOPY  01/2009   biopsy with Barrett's   ESOPHAGOGASTRODUODENOSCOPY  01/2018   WNL - no barrett's ([name])   INGUINAL HERNIA REPAIR Bilateral 03/04/2020   Procedure: LAPAROSCOPIC BILATERAL INGUINAL HERNIA REPAIR WITH MESH;  Surgeon: Anthony Keens, MD;  Location: [place];  Service: General;  Laterality: Bilateral;   LAPAROSCOPIC APPENDECTOMY  07/05/2011   Procedure: APPENDECTOMY LAPAROSCOPIC;  Surgeon: Anthony Klein, MD;  Location: [place];  Service: General;  Laterality: N/A;   MENISCUS REPAIR Right 2018   Anthony [name] @ [place] N/A 07/28/2013  Procedure: TRANSURETHRAL RESECTION OF THE PROSTATE WITH GYRUS INSTRUMENTS;  Surgeon: Anthony Jabs, MD   VASECTOMY     WISDOM TOOTH EXTRACTION      Current Medications: Current Meds  Medication Sig   amLODipine (NORVASC) 10 MG tablet Take 1 tablet (10 mg total) by mouth daily.   aspirin EC 81 MG tablet Take 1 tablet (81 mg total) by mouth daily. Swallow whole.   b complex vitamins tablet Take 1 tablet by mouth daily.   Continuous Blood Gluc  Sensor (DEXCOM G7 SENSOR) MISC by Does not apply route.   Dextrose, Diabetic Use, (GLUCOSE PO) Take 1 tablet by mouth as needed.   donepezil (ARICEPT) 5 MG tablet Take 1 tablet (5 mg total) by mouth at bedtime.   glucose blood (ONETOUCH ULTRA) test strip USE TO check blood glucose four times daily AS DIRECTED   GVOKE HYPOPEN 1-PACK 1 MG/0.2ML SOAJ Inject under skin 0.2 mL as needed for hypoglycemia   insulin aspart (NOVOLOG FLEXPEN) 100 UNIT/ML FlexPen Inject 12-20 Units into the skin 3 (three) times daily before meals.   insulin degludec (TRESIBA FLEXTOUCH) 200 UNIT/ML FlexTouch Pen inject 32 units into THE SKIN DAILY   Insulin Pen Needle (ADVOCATE INSULIN PEN NEEDLES) 31G X 5 MM MISC Use 3x a day   metFORMIN (GLUCOPHAGE-XR) 500 MG 24 hr tablet TAKE ONE TABLET BY MOUTH EVERY MORNING   olmesartan-hydrochlorothiazide (BENICAR HCT) 40-25 MG tablet Take 1 tablet by mouth daily.   OZEMPIC, 1 MG/DOSE, 4 MG/3ML SOPN Inject 1 mg into the skin once a week.   pantoprazole (PROTONIX) 40 MG tablet Take 1 tablet (40 mg total) by mouth daily.   simvastatin (ZOCOR) 20 MG tablet TAKE 1 TABLET BY MOUTH EVERYDAY AT BEDTIME   tadalafil (CIALIS) 20 MG tablet TAKE ONE-HALF TO 1 TABLET BY MOUTH EVERY OTHER DAY AS NEEDED   [DISCONTINUED] amLODipine (NORVASC) 5 MG tablet Take 1 tablet (5 mg total) by mouth daily.     Allergies:   Patient has no known allergies.   Social History   Socioeconomic History   Marital status: Married    Spouse name: Not on file   Number of children: Not on file   Years of education: Not on file   Highest education level: Not on file  Occupational History   Not on file  Tobacco Use   Smoking status: Former    Packs/day: 0.30    Years: 50.00    Total pack years: 15.00    Types: Cigarettes    Quit date: 08/29/2011    Years since quitting: 11.1   Smokeless tobacco: Never  Vaping Use   Vaping Use: Former  Substance and Sexual Activity   Alcohol use: Yes    Alcohol/week: 7.0  standard drinks of alcohol    Types: 7 Standard drinks or equivalent per week    Comment: 1- 2 ounces a night   Drug use: No   Sexual activity: Not Currently  Other Topics Concern   Not on file  Social History Narrative   MD ROSTER:   GI Anthony [name]   GS - Young      Daily Caffeine Use:  2   Owner/operater of dental lab-sold, but continues to work. Fully retired.   Married 1969   2 daughters 1972, 1976; 1 son 54   7 grandchildren   Edu: 10th Grade   Hobby: car restoration: has '66 Vette, two classic Chevelle SS's and is restoring a '37 chevy coup   Regular  Exercise -  NO   Diet: good water, fruits/vegetables daily   Social Determinants of Health   Financial Resource Strain: Low Risk  (09/07/2022)   Overall Financial Resource Strain (CARDIA)    Difficulty of Paying Living Expenses: Not hard at all  Food Insecurity: No Food Insecurity (09/20/2022)   Hunger Vital Sign    Worried About Running Out of Food in the Last Year: Never true    Ran Out of Food in the Last Year: Never true  Transportation Needs: No Transportation Needs (09/20/2022)   PRAPARE - Hydrologist (Medical): No    Lack of Transportation (Non-Medical): No  Physical Activity: Inactive (04/13/2022)   Exercise Vital Sign    Days of Exercise per Week: 0 days    Minutes of Exercise per Session: 0 min  Stress: No Stress Concern Present (04/13/2022)   [place]    Feeling of Stress : Not at all  Social Connections: Not on file    Social: [place]; worked at [employer], community, wife has new dementia in 2022 that has since progressed  Family History: The patient's family history includes Alcohol abuse in his father; Cancer in his father; Coronary artery disease in his mother; Diabetes in his brother, brother, brother and another family member; Heart disease in his mother; Lymphoma in his mother; Pancreatic cancer in his  maternal uncle. There is no history of Colon cancer, Lung cancer, Esophageal cancer, Rectal cancer, or Stomach cancer. History of coronary artery disease notable for mother. History of heart failure notable for no members. History of arrhythmia notable for no members. No history of bicuspid aortic valve or aortic aneurysm or dissection.  ROS:   Please see the history of present illness.     All other systems reviewed and are negative.  EKGs/Labs/Other Studies Reviewed:    The following studies were reviewed today:  EKG:   SR rate 95 WNL 07/28/20: Sinus 75 LAD      NonCardiac CT: Date: 02/25/2020 Results:Distal LAD and D1 calcium, aortic arch atherosclerosis  Recent Labs: 02/24/2022: Hemoglobin 13.7; Platelets 230 09/15/2022: ALT 11; BUN 22; Creatinine, Ser 0.98; Potassium 4.1; Sodium 139; TSH 1.97  Recent Lipid Panel    Component Value Date/Time   CHOL 128 04/14/2022 1054   CHOL 127 08/08/2021 1554   TRIG 115.0 04/14/2022 1054   HDL 40.80 04/14/2022 1054   HDL 43 08/08/2021 1554   CHOLHDL 3 04/14/2022 1054   VLDL 23.0 04/14/2022 1054   LDLCALC 64 04/14/2022 1054   LDLCALC 55 08/08/2021 1554   LDLDIRECT 94.0 10/05/2016 1728    Physical Exam:    VS:  BP (!) 152/82   Pulse 75   Ht 6' (1.829 m)   Wt 219 lb (99.3 kg)   SpO2 97%   BMI 29.70 kg/m     Wt Readings from Last 3 Encounters:  10/05/22 219 lb (99.3 kg)  09/15/22 224 lb 4 oz (101.7 kg)  09/13/22 225 lb 12.8 oz (102.4 kg)    Gen: no distress   Neck: No JVD Cardiac: No Rubs or Gallops, no Murmur, regular rate with +2 radial pulses Respiratory: Clear to auscultation bilaterally, normal effort, normal  respiratory rate GI: Soft, nontender, non-distended  MS: No  edema;  moves all extremities Integument: Skin feels  Neuro:  At time of evaluation, alert and oriented to person/place/time/situation  Psych: Normal affect, patient feels well  ASSESSMENT:    1. Hyperlipidemia  associated with type 2 diabetes  mellitus ([place])   2. Chronic obstructive pulmonary disease, unspecified COPD type ([place])   3. Aortic atherosclerosis ([place])   4. Coronary artery calcification seen on CAT scan   5. Diabetes mellitus with coincident hypertension ([name])     PLAN:    Aortic atherosclerosis Coronary Artery Calcification Hyperlipidemia (mixed) - start ASA 91 mg PO daily -LDL goal less than 70 after SDM - diet is going well - low threshold for CCTA  COPD - offer PFTs  Diabetes with hypertension - restart AMB BP - increase norvasc - continue other home medications   APP or me f/u in one year    Medication Adjustments/Labs and Tests Ordered: Current medicines are reviewed at length with the patient today.  Concerns regarding medicines are outlined above.  Orders Placed This Encounter  Procedures   Pulmonary function test    Meds ordered this encounter  Medications   amLODipine (NORVASC) 10 MG tablet    Sig: Take 1 tablet (10 mg total) by mouth daily.    Dispense:  90 tablet    Refill:  3   aspirin EC 81 MG tablet    Sig: Take 1 tablet (81 mg total) by mouth daily. Swallow whole.    Dispense:  30 tablet    Refill:  12     Patient Instructions  Medication Instructions:  Your physician has recommended you make the following change in your medication:   Increase amlodipine to 10 mg daily *If you need a refill on your cardiac medications before your next appointment, please call your pharmacy*  Testing/Procedures: Pulmonary Function test    Follow-Up: At [facility], you and your health needs are our priority.  As part of our continuing mission to provide you with exceptional heart care, we have created designated Provider Care Teams.  These Care Teams include your primary Cardiologist (physician) and Advanced Practice Providers (APPs -  Physician Assistants and Nurse Practitioners) who all work together to provide you with the care you need, when you need it.  We recommend  signing up for the patient portal called "MyChart".  Sign up information is provided on this After Visit Summary.  MyChart is used to connect with patients for Virtual Visits (Telemedicine).  Patients are able to view lab/test results, encounter notes, upcoming appointments, etc.  Non-urgent messages can be sent to your provider as well.   To learn more about what you can do with MyChart, go to [email or web address].    Your next appointment:   1 year(s)  Provider:   Werner Lean, MD        Signed, Werner Lean, MD  10/05/2022 12:27 PM    [place]

## 2022-10-06 ENCOUNTER — Ambulatory Visit: Payer: Medicare HMO | Admitting: Internal Medicine

## 2022-10-31 ENCOUNTER — Telehealth: Payer: Self-pay

## 2022-10-31 NOTE — Progress Notes (Signed)
Care Management & Coordination Services Pharmacy Team  Reason for Encounter: Medication Coordination and Delivery  Contacted patient to discuss medications and coordinate delivery from [facility].  Spoke with family on 10/31/2022   Cycle dispensing form sent to [facility] for review.   Last adherence delivery date: 10/11/2022      Patient is due for next adherence delivery on: 11/09/2022  This delivery to include: Vials  30 Days  Easy Open Caps  Simvastatin 20 mg Take 1 tablet every day at bedtime OneTouch Test strips - Use up to four times daily Tadalafil 20 mg - 1/2 to 1 tablet daily as needed- PRN use Olmesartan/HCTZ 40-25 mg Take 1 tablet by mouth every day Amlodipine 10 mg 1 tablet daily Pantoprazole 40 mg 1 tablet daily Novolog Flexpen Inject 12-20 units into the skin 3 times daily with meals Tresiba Flex 200 units- 32 units once daily in morning Donepezil '5mg'$ - take 1 tablet bedtime  Metformin XR '500mg'$ -take 1 tablet in morning  Aspirin 81 mg - 1 tablet daily   Patient declined the following medications this month: None  No refill request needed.  Confirmed delivery date of 11/09/2022, advised patient that pharmacy will contact them the morning of delivery.   Any concerns about your medications? No  How often do you forget or accidentally miss a dose? Never  Do you use a pillbox? No  Is patient in packaging No   Recent blood pressure readings are as follows:Patient was not at home for readings.   Recent blood glucose readings are as follows: Patient was not at home for readings.   Chart review: Recent office visits:  None since last contact  Recent consult visits:  10/05/22 Rudean Haskell, MD (Cardiology) HLD Ordered: Pulmonary Function Test Start: Aspirin 81 mg Change: Amlodipine 10 mg  Hospital visits:  None since last contact  Medications: Outpatient Encounter Medications as of 10/31/2022  Medication Sig   amLODipine (NORVASC)  10 MG tablet Take 1 tablet (10 mg total) by mouth daily.   aspirin EC 81 MG tablet Take 1 tablet (81 mg total) by mouth daily. Swallow whole.   b complex vitamins tablet Take 1 tablet by mouth daily.   Continuous Blood Gluc Sensor (DEXCOM G7 SENSOR) MISC by Does not apply route.   Dextrose, Diabetic Use, (GLUCOSE PO) Take 1 tablet by mouth as needed.   donepezil (ARICEPT) 5 MG tablet Take 1 tablet (5 mg total) by mouth at bedtime.   glucose blood (ONETOUCH ULTRA) test strip USE TO check blood glucose four times daily AS DIRECTED   GVOKE HYPOPEN 1-PACK 1 MG/0.2ML SOAJ Inject under skin 0.2 mL as needed for hypoglycemia   insulin aspart (NOVOLOG FLEXPEN) 100 UNIT/ML FlexPen Inject 12-20 Units into the skin 3 (three) times daily before meals.   insulin degludec (TRESIBA FLEXTOUCH) 200 UNIT/ML FlexTouch Pen inject 32 units into THE SKIN DAILY   Insulin Pen Needle (ADVOCATE INSULIN PEN NEEDLES) 31G X 5 MM MISC Use 3x a day   metFORMIN (GLUCOPHAGE-XR) 500 MG 24 hr tablet TAKE ONE TABLET BY MOUTH EVERY MORNING   olmesartan-hydrochlorothiazide (BENICAR HCT) 40-25 MG tablet Take 1 tablet by mouth daily.   OZEMPIC, 1 MG/DOSE, 4 MG/3ML SOPN Inject 1 mg into the skin once a week.   pantoprazole (PROTONIX) 40 MG tablet Take 1 tablet (40 mg total) by mouth daily.   simvastatin (ZOCOR) 20 MG tablet TAKE 1 TABLET BY MOUTH EVERYDAY AT BEDTIME   tadalafil (CIALIS) 20 MG tablet TAKE  ONE-HALF TO 1 TABLET BY MOUTH EVERY OTHER DAY AS NEEDED   No facility-administered encounter medications on file as of 10/31/2022.   BP Readings from Last 3 Encounters:  10/05/22 (!) 152/82  09/15/22 136/80  09/13/22 128/80    Pulse Readings from Last 3 Encounters:  10/05/22 75  09/15/22 83  09/13/22 76    Lab Results  Component Value Date/Time   HGBA1C 7.2 (A) 09/13/2022 11:16 AM   HGBA1C 7.3 (A) 03/02/2022 01:17 PM   HGBA1C 7.7 (H) 03/02/2020 10:26 AM   HGBA1C 7.8 (H) 01/12/2020 10:21 AM   Lab Results  Component Value  Date   CREATININE 0.98 09/15/2022   BUN 22 09/15/2022   GFR 76.10 09/15/2022   GFRNONAA >60 03/02/2020   GFRAA >60 03/02/2020   NA 139 09/15/2022   K 4.1 09/15/2022   CALCIUM 9.1 09/15/2022   CO2 28 09/15/2022   Charlene Brooke, CPP notified   [name], [place] Clinical Pharmacy Assistant [phone]

## 2022-11-09 ENCOUNTER — Ambulatory Visit: Payer: Self-pay

## 2022-11-09 NOTE — Patient Outreach (Signed)
  Care Coordination   11/09/2022 Name: AMARIS GARRETTE MRN: [id number] DOB: Jan 24, 1946   Care Coordination Outreach Attempts:  An unsuccessful telephone outreach was attempted for a scheduled appointment today. HIPAA compliant message left with call back phone number.   Follow Up Plan:  Additional outreach attempts will be made to offer the patient care coordination information and services.   Encounter Outcome:  No Answer   Care Coordination Interventions:  No, not indicated    Quinn Plowman [facility] [place] [phone] direct line

## 2022-11-13 ENCOUNTER — Telehealth: Payer: Self-pay

## 2022-11-13 NOTE — Progress Notes (Signed)
Care Management & Coordination Services Pharmacy Team  Reason for Encounter: Appointment Reminder  Contacted patient to confirm telephone appointment with Charlene Brooke, PharmD on 11/16/2022 at 3:00.  Spoke with patient on 11/13/2022   Do you have any problems getting your medications? No  What is your top health concern you would like to discuss at your upcoming visit? Patient's knee is giving him trouble; he is unable to get them fixed due to his DM.   Have you seen any other providers since your last visit with PCP? No  Star Rating Drugs:  Medication:  Last Fill: Day Supply Metformin 500 mg 11/07/2022 30 Ozempic 1 mg 01/09/2022 28 - Pt no longer taking - makes him never eat per patient. Simvastatin 20 mg 11/07/2022 30  Care Gaps: Annual wellness visit in last year? Yes 04/13/2022  If Diabetic: Last eye exam / retinopathy screening: Overdue Last diabetic foot exam: Up to date  Charlene Brooke, PharmD notified  [name], [place] Assistant [phone]

## 2022-11-14 ENCOUNTER — Telehealth: Payer: Medicare HMO

## 2022-11-15 ENCOUNTER — Telehealth: Payer: Self-pay | Admitting: *Deleted

## 2022-11-15 NOTE — Progress Notes (Signed)
  Care Coordination Note  11/15/2022 Name: Anthony [name] MRN: [id number] DOB: 21-Feb-1946  Anthony [name] is a 77 y.o. year old male who is a primary care patient of [name], MD and is actively engaged with the care management team. I reached out to [name] by phone today to assist with re-scheduling a follow up visit with the RN Case Manager  Follow up plan: Unsuccessful telephone outreach attempt made. A HIPAA compliant phone message was left for the patient providing contact information and requesting a return call.   Julian Hy, [place] Direct Dial: [phone]

## 2022-11-16 ENCOUNTER — Telehealth: Payer: Self-pay | Admitting: Pharmacist

## 2022-11-16 ENCOUNTER — Encounter: Payer: Medicare HMO | Admitting: Pharmacist

## 2022-11-16 NOTE — Telephone Encounter (Signed)
Care Management & Coordination Services Outreach Note  11/16/2022 Name: Anthony [name] MRN: [id number] DOB: 10-17-1945  Referred by: [name], MD  Patient had a phone appointment scheduled with clinical pharmacist today.  An unsuccessful telephone outreach was attempted today. The patient was referred to the pharmacist for assistance with medications, care management and care coordination.   Patient will NOT be penalized in any way for missing a Care Management & Coordination Services appointment. The no-show fee does not apply.  If possible, a message was left to return call to: [phone] or to [facility].  Charlene Brooke, PharmD, BCACP Clinical Pharmacist [facility] at [facility] [phone]

## 2022-11-16 NOTE — Progress Notes (Unsigned)
Care Management & Coordination Services Pharmacy Note  11/16/2022 Name:  BARNIE SHALHOUB MRN:  [id number] DOB:  02-May-1946  Summary: F/U visit -DM: A1c 7.3% (02/2022), though query accuracy of A1c as CGM showed significantly worse control (GMI 8.6%); pt stopped Ozempic for several months due to fear of side effects, he says full 1 mg dose is "too much" for him -Pt is not currently wearing Dexcom because his sensors ran out and he is awaiting delivery of refills. He is monitoring BG several times daily -Today: FBG 212, took [name] 32 u. Novolog 12 units w/ BF. BG 286 mid-morning. BG 227 2pm. Denies low BG over past couple weeks. -[facility]: pt missed 2 appts in Nov (PCP and endocrine) because he forgot about them, advised he reschedule these appts   Recommendations/Changes made from today's visit: -Advised to resume Ozempic, he can take 1/2 dose (dial pen up half-way) -Rescheduled missed PCP appt  Follow up plan: -[place] will call patient 2 months for DM update -Pharmacist follow up televisit scheduled for 3 months -PCP appt 12/15/22    Subjective: Anthony [name] is an 77 y.o. year old male who is a primary patient of [name], MD.  The care coordination team was consulted for assistance with disease management and care coordination needs.    Engaged with patient by telephone for follow up visit.  Recent office visits: 09/15/22 Dr Danise Mina OV: f/u - start donepezil 5 mg. Encouraged Novolog 15 min prior to meals. Refer to [place] for caregiver strain.  04/19/22 Dr Danise Mina OV: annual - no changes. MMSE 24/30 - mild memory loss. F/u 3 months - memory.   Recent consult visits: 10/05/22 Dr Gasper Sells (Cardiology): CAD - start aspirin 81 mg. Increase amlodipine to 10 mg. Ordered PFT  09/13/22 Dr Cruzita Lederer (Endocrine): A1c 7.2%; GMI 8.6% (TIR 35%); pt taking Ozempic 0.5 mg weekly; advised centralizing medical care and directed to PCP for DM control.   03/06/22 NP Cecilie Kicks (Cardiology): preop clearance, acceptable risk.   03/02/22 Dr Cruzita Lederer (Endocrine): DM - A1c 7.3%; insulin compliance is main issue - not taking Novolog in AM or out to dinner; CGM - TIR 34%, GMI 8.6%; BG is swinging widely, cannot be cleared for knee surgery. No changes to prescribed regimen, just improve compliance.  Hospital visits: None in previous 6 months   Objective:  Lab Results  Component Value Date   CREATININE 0.98 09/15/2022   BUN 22 09/15/2022   GFR 76.10 09/15/2022   GFRNONAA >60 03/02/2020   GFRAA >60 03/02/2020   NA 139 09/15/2022   K 4.1 09/15/2022   CALCIUM 9.1 09/15/2022   CO2 28 09/15/2022   GLUCOSE 279 (H) 09/15/2022    Lab Results  Component Value Date/Time   HGBA1C 7.2 (A) 09/13/2022 11:16 AM   HGBA1C 7.3 (A) 03/02/2022 01:17 PM   HGBA1C 7.7 (H) 03/02/2020 10:26 AM   HGBA1C 7.8 (H) 01/12/2020 10:21 AM   FRUCTOSAMINE 284 09/15/2022 11:47 AM   FRUCTOSAMINE 314 (H) 04/14/2022 10:55 AM   GFR 76.10 09/15/2022 11:47 AM   GFR 81.27 04/14/2022 10:54 AM   MICROALBUR 6.6 (H) 04/14/2022 10:54 AM   MICROALBUR 6.3 (H) 06/28/2015 09:39 AM    Last diabetic Eye exam:  Lab Results  Component Value Date/Time   HMDIABEYEEXA No Retinopathy 09/23/2021 12:00 AM   HMDIABEYEEXA No Retinopathy 09/23/2021 12:00 AM    Last diabetic Foot exam:  Lab Results  Component Value Date/Time   HMDIABFOOTEX done by podiatry - in  chart 05/19/2019 12:00 AM     Lab Results  Component Value Date   CHOL 128 04/14/2022   HDL 40.80 04/14/2022   LDLCALC 64 04/14/2022   LDLDIRECT 94.0 10/05/2016   TRIG 115.0 04/14/2022   CHOLHDL 3 04/14/2022       Latest Ref Rng & Units 09/15/2022   11:47 AM 04/14/2022   10:54 AM 08/08/2021    3:54 PM  Hepatic Function  Total Protein 6.0 - 8.3 g/dL 6.3  6.1    Albumin 3.5 - 5.2 g/dL 4.1  4.0    AST 0 - 37 U/L 12  13    ALT 0 - 53 U/L 11  14  14    Alk Phosphatase 39 - 117 U/L 94  83    Total Bilirubin 0.2 - 1.2 mg/dL 0.6  1.0       Lab Results  Component Value Date/Time   TSH 1.97 09/15/2022 11:47 AM   TSH 2.05 04/14/2022 10:55 AM   FREET4 0.88 06/26/2014 09:05 AM       Latest Ref Rng & Units 02/24/2022    4:09 PM 03/02/2020   10:26 AM 01/05/2017   12:28 PM  CBC  WBC 3.8 - 10.8 Thousand/uL 7.0  8.5  5.7   Hemoglobin 13.2 - 17.1 g/dL 13.7  14.5  14.2   Hematocrit 38.5 - 50.0 % 40.7  42.8  41.3   Platelets 140 - 400 Thousand/uL 230  227  209.0     Lab Results  Component Value Date/Time   VITAMINB12 418 09/15/2022 11:47 AM   VITAMINB12 707 03/08/2021 03:08 PM    Clinical ASCVD: Yes  The ASCVD Risk score (Arnett DK, et al., 2019) failed to calculate for the following reasons:   The valid total cholesterol range is 130 to 320 mg/dL       09/20/2022    2:38 PM 09/15/2022   11:19 AM 04/13/2022    2:09 PM  Depression screen PHQ 2/9  Decreased Interest 0 0 0  Down, Depressed, Hopeless 0 0 0  PHQ - 2 Score 0 0 0  Altered sleeping  2   Tired, decreased energy  1   Change in appetite  0   Feeling bad or failure about yourself   0   Trouble concentrating  1   Moving slowly or fidgety/restless  0   Suicidal thoughts  0   PHQ-9 Score  4   Difficult doing work/chores  Somewhat difficult      Social History   Tobacco Use  Smoking Status Former   Packs/day: 0.30   Years: 50.00   Additional pack years: 0.00   Total pack years: 15.00   Types: Cigarettes   Quit date: 08/29/2011   Years since quitting: 11.2  Smokeless Tobacco Never   BP Readings from Last 3 Encounters:  10/05/22 (!) 152/82  09/15/22 136/80  09/13/22 128/80   Pulse Readings from Last 3 Encounters:  10/05/22 75  09/15/22 83  09/13/22 76   Wt Readings from Last 3 Encounters:  10/05/22 219 lb (99.3 kg)  09/15/22 224 lb 4 oz (101.7 kg)  09/13/22 225 lb 12.8 oz (102.4 kg)   BMI Readings from Last 3 Encounters:  10/05/22 29.70 kg/m  09/15/22 31.72 kg/m  09/13/22 31.94 kg/m    No Known Allergies  Medications Reviewed Today      Reviewed by Guido Sander, CMA (Certified Medical Assistant) on 10/05/22 at 1126  Med List Status: <None>   Medication Order Taking? Sig  Documenting Provider Last Dose Status Informant  amLODipine (NORVASC) 5 MG tablet [id number] Yes Take 1 tablet (5 mg total) by mouth daily. [name], MD Taking Active   b complex vitamins tablet [id number] Yes Take 1 tablet by mouth daily. [provider] Taking Active Self  Continuous Blood Gluc Sensor ([place]) Junior [id number] Yes by Does not apply route. [provider] Taking Active   Dextrose, Diabetic Use, (GLUCOSE PO) [id number] Yes Take 1 tablet by mouth as needed. [provider] Taking Active Self  donepezil (ARICEPT) 5 MG tablet [id number] Yes Take 1 tablet (5 mg total) by mouth at bedtime. [name], MD Taking Active   glucose blood [facility] ULTRA) test strip [id number] Yes USE TO check blood glucose four times daily AS DIRECTED [name], MD Taking Active   GVOKE HYPOPEN 1-PACK 1 MG/0.2ML Darden Palmer [id number] Yes Inject under skin 0.2 mL as needed for hypoglycemia [name], MD Taking Active   insulin aspart (NOVOLOG FLEXPEN) 100 UNIT/ML FlexPen [id number] Yes Inject 12-20 Units into the skin 3 (three) times daily before meals. [name], MD Taking Active   insulin degludec (TRESIBA FLEXTOUCH) 200 UNIT/ML FlexTouch Pen [id number] Yes inject 32 units into THE SKIN DAILY [name], MD Taking Active   Insulin Pen Needle (ADVOCATE INSULIN PEN NEEDLES) 31G X 5 MM MISC [id number] Yes Use 3x a day [name], MD Taking Active Self  metFORMIN (GLUCOPHAGE-XR) 500 MG 24 hr tablet [id number] Yes TAKE ONE TABLET BY MOUTH EVERY Maryln Gottron, MD Taking Active   olmesartan-hydrochlorothiazide (BENICAR HCT) 40-25 MG tablet [id number] Yes Take 1 tablet by mouth daily. [name], MD Taking Active   OZEMPIC, 1 MG/DOSE, 4 MG/3ML Bon[place] Aid [id number] Yes Inject 1 mg into the skin  once a week. [name], MD Taking Active            Med Note Rolan Bucco Sep 07, 2022  2:32 PM)    pantoprazole (PROTONIX) 40 MG tablet [id number] Yes Take 1 tablet (40 mg total) by mouth daily. [name], MD Taking Active   simvastatin (ZOCOR) 20 MG tablet [id number] Yes TAKE 1 TABLET BY MOUTH EVERYDAY AT BEDTIME [name], MD Taking Active   tadalafil (CIALIS) 20 MG tablet [id number] Yes TAKE ONE-HALF TO 1 TABLET BY MOUTH EVERY OTHER DAY AS NEEDED [name], MD Taking Active             SDOH:  (Social Determinants of Health) assessments and interventions performed: Yes SDOH Interventions    [place] Coordination from 09/20/2022 in [place] Coordination from 09/07/2022 in [place] Management from 02/15/2021 in [place] at [place] Management from 01/20/2021 in [place] at [place] Management from 10/15/2020 in [place] at [place] Management from 08/25/2020 in [place] at [place] Interventions Intervention Not Indicated -- Intervention Not Indicated -- -- --  Housing Interventions Intervention Not Indicated Intervention Not Indicated Intervention Not Indicated -- -- --  Transportation Interventions Intervention Not Indicated -- Intervention Not Indicated -- -- --  Financial Strain Interventions -- Intervention Not Indicated -- Other (Comment)  [Apply for Ozempic assistance in coverage gap] Intervention Not Indicated  [Medications affordable] Intervention Not Indicated      SDOH Screenings   Food Insecurity: No Food Insecurity (  09/20/2022)  Housing: Low Risk  (09/20/2022)  Transportation Needs: No Transportation Needs (09/20/2022)  Alcohol Screen: Low Risk  (01/12/2020)   Depression (PHQ2-9): Low Risk  (09/20/2022)  Financial Resource Strain: Low Risk  (09/07/2022)  Physical Activity: Inactive (04/13/2022)  Stress: No Stress Concern Present (04/13/2022)  Tobacco Use: Medium Risk (10/05/2022)    Medication Assistance: None required.  Patient affirms current coverage meets needs.  Medication Access: Within the past 30 days, how often has patient missed a dose of medication? 0 Is a pillbox or other method used to improve adherence? Yes  Factors that may affect medication adherence? lack of understanding of disease management Are meds synced by current pharmacy? Yes  Are meds delivered by current pharmacy? Yes  Does patient experience delays in picking up medications due to transportation concerns? No   [facility] Reviewed: Is patient disadvantaged to use [facility]?: No  Current Rx insurance plan: [employer] Name and location of Current pharmacy:  [facility] - [place], [place] - [address] [place] [address] Phone: [phone] Fax: [phone]  [id number] - [place], [address] [employer] [place] [address] Phone: [phone] Fax: [phone]  [facility] services reviewed with patient today?: Yes  30-day vials: Delivery 11/09/22 Simvastatin 20 mg Take 1 tablet every day at bedtime OneTouch Test strips - Use up to four times daily Tadalafil 20 mg - 1/2 to 1 tablet daily as needed- PRN use Olmesartan/HCTZ 40-25 mg Take 1 tablet by mouth every day Amlodipine 10 mg 1 tablet daily Pantoprazole 40 mg 1 tablet daily Novolog Flexpen Inject 12-20 units into the skin 3 times daily with meals Tresiba Flex 200 units- 32 units once daily in morning Donepezil 5mg - take 1 tablet bedtime  Metformin XR 500mg -take 1 tablet in morning  Aspirin 81 mg - 1 tablet daily  Compliance/Adherence/Medication fill history: Care Gaps: None  Star-Rating  Drugs: Simvastatin - PDC 99% Olmesartan-HCTZ - PDC 100% Metformin - PDC 36%; only filling regularly since 08/2022 Ozempic - PDC 0%; LF 12/2021    ASSESSMENT / PLAN  Diabetes (A1c goal <8%) -Not ideally controlled - A1c 7.3% (02/2022), A1c has not been accurate in comparison to CGM data which shows worse control; he stopped Ozempic for several months due to fear of side effects -Today: FBG 212. Tresiba 32 u. Novolog 12 units w/ BF. BG 286 mid-morning. BG 227 2pm. Denies low BG over past couple weeks. -No longer following with endocrine; hx severe hypoglycemia and memory impairment -DM dx 2006, insulin since 2008 -Current medications: Metformin 500 mg daily AM -  Appropriate, Effective, Safe, Accessible Novolog 08-11-17 u w meals - Appropriate, Query Effective Tresiba U200 32 units daily - Appropriate, Query Effective Ozempic 1 mg weekly -not taking Gvoke Hypopen PRN - not used Dexcom G7 -Appropriate, Effective, Safe, Accessible -Medications previously tried: upset stomach on higher dose metformin  -Current meal patterns: eats 2 meals/day (late breakfast ~11 AM, early dinner ~ 5 PM) -Current exercise: tries to walk when he can -Educated on A1c and blood sugar goals; consequences of high BG on wound healing/surgery outcomes -Reviewed efficacy/safety data for Ozempic, overwhelmingly benefits outweigh risks; he has not had side effects himself (other than appetite suppression) -Advised pt to restart Ozempic, he can take 1/2 dose if full dose is not tolerated (dial pen up halfway)  Hypertension (BP goal <140/90) -Controlled - per OV readings -Current home readings: n/a; Denies hypotensive/hypertensive symptoms -Current treatment:  Amlodipine 5 mg daily - Appropriate, Effective, Safe, Accessible Olmesartan-HCTZ 40-25 mg daily -Appropriate, Effective, Safe, Accessible -Medications previously tried: n/a  -Educated on BP goals and benefits of medications for prevention of heart attack, stroke  and kidney damage; -Counseled to monitor BP at home periodically -Recommended to continue current medication  Hyperlipidemia: (LDL goal < 70) -Controlled - LDL 64 (03/2022) at goal -Hx aortic atherosclerosis -Current treatment: Simvastatin 20 mg daily HS - Appropriate, Effective, Safe, Accessible -Medications previously tried: n/a  -Educated on Cholesterol goals; Benefits of statin for ASCVD risk reduction; -Recommended to continue current medication  [facility] (Goal: ***) -{US controlled/uncontrolled:25276} -Current treatment  Donepezil 5 mg daily HS -Medications previously tried: ***  -{CCMPHARMDINTERVENTION:25122}  Health Maintenance -Hx COPD, stable period off inhalers   Charlene Brooke, PharmD, BCACP Clinical Pharmacist [facility] [phone]

## 2022-11-21 DIAGNOSIS — E119 Type 2 diabetes mellitus without complications: Secondary | ICD-10-CM | POA: Diagnosis not present

## 2022-11-21 DIAGNOSIS — Z961 Presence of intraocular lens: Secondary | ICD-10-CM | POA: Diagnosis not present

## 2022-11-21 DIAGNOSIS — D3132 Benign neoplasm of left choroid: Secondary | ICD-10-CM | POA: Diagnosis not present

## 2022-11-21 LAB — HM DIABETES EYE EXAM

## 2022-11-22 ENCOUNTER — Encounter: Payer: Self-pay | Admitting: Internal Medicine

## 2022-11-26 ENCOUNTER — Other Ambulatory Visit: Payer: Self-pay | Admitting: Family Medicine

## 2022-11-26 DIAGNOSIS — N529 Male erectile dysfunction, unspecified: Secondary | ICD-10-CM

## 2022-11-29 ENCOUNTER — Telehealth: Payer: Self-pay

## 2022-11-29 NOTE — Progress Notes (Unsigned)
Care Management & Coordination Services Pharmacy Team  Reason for Encounter: Medication Coordination and Delivery  Contacted patient to discuss medications and coordinate delivery from [facility].  Spoke with patient on 11/29/2022   Cycle dispensing form sent to [facility], [facility] for review.   Last adherence delivery date: 11/09/2022       Patient is due for next adherence delivery on: 12/11/2022  This delivery to include: Vials  30 Days  Easy Open Caps  Simvastatin 20 mg Take 1 tablet every day at bedtime OneTouch Test strips - Use up to four times daily Tadalafil 20 mg - 1/2 to 1 tablet daily as needed- PRN use Olmesartan/HCTZ 40-25 mg Take 1 tablet by mouth every day Amlodipine 10 mg 1 tablet daily Pantoprazole 40 mg 1 tablet daily Novolog Flexpen Inject 12-20 units into the skin 3 times daily with meals Tresiba Flex 200 units- 32 units once daily in morning Donepezil 5mg - take 1 tablet bedtime  Metformin XR 500mg -take 1 tablet in morning  Aspirin 81 mg - 1 tablet daily  Patient declined the following medications this month: None  No refill request needed.  Confirmed delivery date of 12/11/2022, advised patient that pharmacy will contact them the morning of delivery.   Any concerns about your medications? No  How often do you forget or accidentally miss a dose? Never  Do you use a pillbox? No  Is patient in packaging No   Recent blood pressure readings are as follows:***  Recent blood glucose readings are as follows:***  Chart review: Recent office visits:  None since last contact  Recent consult visits:  None since last contact  Hospital visits:  None since last contact  Medications: Outpatient Encounter Medications as of 11/29/2022  Medication Sig   amLODipine (NORVASC) 10 MG tablet Take 1 tablet (10 mg total) by mouth daily.   aspirin EC 81 MG tablet Take 1 tablet (81 mg total) by mouth daily. Swallow whole.   b complex vitamins tablet Take  1 tablet by mouth daily.   Continuous Blood Gluc Sensor (DEXCOM G7 SENSOR) MISC by Does not apply route.   Dextrose, Diabetic Use, (GLUCOSE PO) Take 1 tablet by mouth as needed.   donepezil (ARICEPT) 5 MG tablet Take 1 tablet (5 mg total) by mouth at bedtime.   glucose blood (ONETOUCH ULTRA) test strip USE TO check blood glucose four times daily AS DIRECTED   GVOKE HYPOPEN 1-PACK 1 MG/0.2ML SOAJ Inject under skin 0.2 mL as needed for hypoglycemia   insulin aspart (NOVOLOG FLEXPEN) 100 UNIT/ML FlexPen Inject 12-20 Units into the skin 3 (three) times daily before meals.   insulin degludec (TRESIBA FLEXTOUCH) 200 UNIT/ML FlexTouch Pen inject 32 units into THE SKIN DAILY   Insulin Pen Needle (ADVOCATE INSULIN PEN NEEDLES) 31G X 5 MM MISC Use 3x a day   metFORMIN (GLUCOPHAGE-XR) 500 MG 24 hr tablet TAKE ONE TABLET BY MOUTH EVERY MORNING   olmesartan-hydrochlorothiazide (BENICAR HCT) 40-25 MG tablet Take 1 tablet by mouth daily.   OZEMPIC, 1 MG/DOSE, 4 MG/3ML SOPN Inject 1 mg into the skin once a week.   pantoprazole (PROTONIX) 40 MG tablet Take 1 tablet (40 mg total) by mouth daily.   simvastatin (ZOCOR) 20 MG tablet TAKE 1 TABLET BY MOUTH EVERYDAY AT BEDTIME   tadalafil (CIALIS) 20 MG tablet TAKE ONE-HALF TO 1 TABLET BY MOUTH EVERY OTHER DAY AS NEEDED   No facility-administered encounter medications on file as of 11/29/2022.   BP Readings from Last 3  Encounters:  10/05/22 (!) 152/82  09/15/22 136/80  09/13/22 128/80    Pulse Readings from Last 3 Encounters:  10/05/22 75  09/15/22 83  09/13/22 76    Lab Results  Component Value Date/Time   HGBA1C 7.2 (A) 09/13/2022 11:16 AM   HGBA1C 7.3 (A) 03/02/2022 01:17 PM   HGBA1C 7.7 (H) 03/02/2020 10:26 AM   HGBA1C 7.8 (H) 01/12/2020 10:21 AM   Lab Results  Component Value Date   CREATININE 0.98 09/15/2022   BUN 22 09/15/2022   GFR 76.10 09/15/2022   GFRNONAA >60 03/02/2020   GFRAA >60 03/02/2020   NA 139 09/15/2022   K 4.1 09/15/2022    CALCIUM 9.1 09/15/2022   CO2 28 09/15/2022   Charlene Brooke, CPP notified   [name], [place] Clinical Pharmacy Assistant [phone]

## 2022-11-30 ENCOUNTER — Ambulatory Visit (INDEPENDENT_AMBULATORY_CARE_PROVIDER_SITE_OTHER): Payer: Medicare HMO | Admitting: Internal Medicine

## 2022-11-30 ENCOUNTER — Telehealth: Payer: Self-pay | Admitting: Pharmacist

## 2022-11-30 ENCOUNTER — Telehealth: Payer: Self-pay

## 2022-11-30 ENCOUNTER — Encounter: Payer: Self-pay | Admitting: Internal Medicine

## 2022-11-30 VITALS — BP 136/66 | HR 64 | Temp 97.3°F | Ht 70.5 in | Wt 229.0 lb

## 2022-11-30 DIAGNOSIS — R600 Localized edema: Secondary | ICD-10-CM | POA: Insufficient documentation

## 2022-11-30 NOTE — Progress Notes (Signed)
Subjective:    Patient ID: Anthony [name], male    DOB: 02/21/1946, 77 y.o.   MRN: [id number]  HPI Here due to leg swelling  Has noted calf swelling---mostly around ankles and feet Goes back a week or more Makes it hard for him to walk--hurts Just as bad in the morning  Doesn't use salt No SOB No chest pain No recent med changes Sleeps flat in bed---no PND. Does move to recliner at times for comfort (joints)  Current Outpatient Medications on File Prior to Visit  Medication Sig Dispense Refill   amLODipine (NORVASC) 10 MG tablet Take 1 tablet (10 mg total) by mouth daily. 90 tablet 3   aspirin EC 81 MG tablet Take 1 tablet (81 mg total) by mouth daily. Swallow whole. 30 tablet 12   b complex vitamins tablet Take 1 tablet by mouth daily.     Continuous Blood Gluc Sensor (DEXCOM G7 SENSOR) MISC by Does not apply route.     Dextrose, Diabetic Use, (GLUCOSE PO) Take 1 tablet by mouth as needed.     donepezil (ARICEPT) 5 MG tablet Take 1 tablet (5 mg total) by mouth at bedtime. 30 tablet 6   glucose blood (ONETOUCH ULTRA) test strip USE TO check blood glucose four times daily AS DIRECTED 150 strip 3   GVOKE HYPOPEN 1-PACK 1 MG/0.2ML SOAJ Inject under skin 0.2 mL as needed for hypoglycemia 0.2 mL 11   insulin aspart (NOVOLOG FLEXPEN) 100 UNIT/ML FlexPen Inject 12-20 Units into the skin 3 (three) times daily before meals. 60 mL 1   insulin degludec (TRESIBA FLEXTOUCH) 200 UNIT/ML FlexTouch Pen inject 32 units into THE SKIN DAILY 27 mL 2   Insulin Pen Needle (ADVOCATE INSULIN PEN NEEDLES) 31G X 5 MM MISC Use 3x a day 300 each 3   metFORMIN (GLUCOPHAGE-XR) 500 MG 24 hr tablet TAKE ONE TABLET BY MOUTH EVERY MORNING 30 tablet 3   olmesartan-hydrochlorothiazide (BENICAR HCT) 40-25 MG tablet Take 1 tablet by mouth daily. 90 tablet 3   OZEMPIC, 1 MG/DOSE, 4 MG/3ML SOPN Inject 1 mg into the skin once a week. 9 mL 3   pantoprazole (PROTONIX) 40 MG tablet Take 1 tablet (40 mg total) by mouth  daily. 90 tablet 3   simvastatin (ZOCOR) 20 MG tablet TAKE 1 TABLET BY MOUTH EVERYDAY AT BEDTIME 90 tablet 3   tadalafil (CIALIS) 20 MG tablet TAKE ONE-HALF TO 1 TABLET BY MOUTH EVERY OTHER DAY AS NEEDED 5 tablet 5   No current facility-administered medications on file prior to visit.    No Known Allergies  Past Medical History:  Diagnosis Date   Aortic atherosclerosis 02/28/2020   By CT   Barrett's esophagus 12/08/2015   By EGD 2010 [name])    BPH with obstruction/lower urinary tract symptoms 2014   s/p TURP   COPD (chronic obstructive pulmonary disease) 02/28/2020   By CT: diffuse bronchial wall thickening with mild centrilobular and paraseptal emphysema (01/2020)   Coronary artery calcification seen on CAT scan 02/28/2020   3v CAD - calcified atherosclerotic plaque in the left main, left anterior descending and left circumflex coronary arteries (01/2020)   Diabetes mellitus type 2 with complications    completed DMSE   DKA (diabetic ketoacidoses) 01/2010   "in coma"   Ex-smoker 01/21/2020   GERD (gastroesophageal reflux disease)    HTN (hypertension)    Hyperlipidemia    controlled with medicine   Osteoarthritis    Rheumatic fever    maybe as  child    Past Surgical History:  Procedure Laterality Date   ABI  12/2013   WNL   APPENDECTOMY     CATARACT EXTRACTION W/PHACO Right 12/01/2020   Procedure: CATARACT EXTRACTION PHACO AND INTRAOCULAR LENS PLACEMENT ([place])  RIGHT VIVITY TORIC LENS DIABETIC;  Surgeon: Leandrew Koyanagi, MD;  Location: [place] [facility];  Service: Ophthalmology;  Laterality: Right;  6.93 1:12.8 9.5%   CATARACT EXTRACTION W/PHACO Left 12/15/2020   Procedure: CATARACT EXTRACTION PHACO AND INTRAOCULAR LENS PLACEMENT ([place])  VIVITY TORIC LENS DIABETIC LEFT EYE;  Surgeon: Leandrew Koyanagi, MD;  Location: [facility];  Service: Ophthalmology;  Laterality: Left;  3.47 1:03.8 5.5%   CHOLECYSTECTOMY     years ago   COLONOSCOPY  01/2009   TA, rec rpt  5 yrs [name])   COLONOSCOPY  01/2018   TAx2, diverticulosis, int hemorrhoids, rpt 5 yrs ([name])   ESOPHAGOGASTRODUODENOSCOPY  01/2009   biopsy with Barrett's   ESOPHAGOGASTRODUODENOSCOPY  01/2018   WNL - no barrett's ([name])   INGUINAL HERNIA REPAIR Bilateral 03/04/2020   Procedure: LAPAROSCOPIC BILATERAL INGUINAL HERNIA REPAIR WITH MESH;  Surgeon: [name], MD;  Location: [place];  Service: General;  Laterality: Bilateral;   LAPAROSCOPIC APPENDECTOMY  07/05/2011   Procedure: APPENDECTOMY LAPAROSCOPIC;  Surgeon: [name], MD;  Location: [place];  Service: General;  Laterality: N/A;   MENISCUS REPAIR Right 2018   [name] @ [facility]   TRANSURETHRAL RESECTION OF PROSTATE N/A 07/28/2013   Procedure: TRANSURETHRAL RESECTION OF THE PROSTATE WITH GYRUS INSTRUMENTS;  Surgeon: [name], MD   VASECTOMY     WISDOM TOOTH EXTRACTION      Family History  Problem Relation Age of Onset   Lymphoma Mother        Non-hodgkins   Coronary artery disease Mother    Heart disease Mother    Cancer Father    Alcohol abuse Father    Diabetes Brother    Pancreatic cancer Maternal Uncle    Diabetes Brother    Diabetes Brother    Diabetes Other    Colon cancer Neg Hx    Lung cancer Neg Hx    Esophageal cancer Neg Hx    Rectal cancer Neg Hx    Stomach cancer Neg Hx     Social History   Socioeconomic History   Marital status: Married    Spouse name: Not on file   Number of children: Not on file   Years of education: Not on file   Highest education level: Not on file  Occupational History   Not on file  Tobacco Use   Smoking status: Former    Packs/day: 0.30    Years: 50.00    Additional pack years: 0.00    Total pack years: 15.00    Types: Cigarettes    Quit date: 08/29/2011    Years since quitting: 11.2   Smokeless tobacco: Never  Vaping Use   Vaping Use: Former  Substance and Sexual Activity   Alcohol use: Yes    Alcohol/week: 7.0 standard drinks of  alcohol    Types: 7 Standard drinks or equivalent per week    Comment: 1- 2 ounces a night   Drug use: No   Sexual activity: Not Currently  Other Topics Concern   Not on file  Social History Narrative   MD ROSTER:   GI [name]   GS - Young      Daily Caffeine Use:  2   Owner/operater of dental  lab-sold, but continues to work. Fully retired.   Married 1969   2 daughters 1972, 1976; 1 son 70   7 grandchildren   Edu: 10th Grade   Hobby: car restoration: has '66 Vette, two classic Chevelle SS's and is restoring a '37 chevy coup   Regular Exercise -  NO   Diet: good water, fruits/vegetables daily   Social Determinants of Health   Financial Resource Strain: Low Risk  (09/07/2022)   Overall Financial Resource Strain (CARDIA)    Difficulty of Paying Living Expenses: Not hard at all  Food Insecurity: No Food Insecurity (09/20/2022)   Hunger Vital Sign    Worried About Running Out of Food in the Last Year: Never true    Ran Out of Food in the Last Year: Never true  Transportation Needs: No Transportation Needs (09/20/2022)   PRAPARE - Hydrologist (Medical): No    Lack of Transportation (Non-Medical): No  Physical Activity: Inactive (04/13/2022)   Exercise Vital Sign    Days of Exercise per Week: 0 days    Minutes of Exercise per Session: 0 min  Stress: No Stress Concern Present (04/13/2022)   [place]    Feeling of Stress : Not at all  Social Connections: Not on file  Intimate Partner Violence: Not At Risk (01/12/2020)   Humiliation, Afraid, Rape, and Kick questionnaire    Fear of Current or Ex-Partner: No    Emotionally Abused: No    Physically Abused: No    Sexually Abused: No   Review of Systems 2 arthritic knees also No recent travel No change in diet    Objective:   Physical Exam Constitutional:      Appearance: Normal appearance.  Cardiovascular:     Rate and  Rhythm: Normal rate and regular rhythm.     Heart sounds: No murmur heard.    No gallop.     Comments: Faint pedal pulses Pulmonary:     Effort: Pulmonary effort is normal.     Breath sounds: No rales.     Comments: Slightly decreased breath sounds but not tight Abdominal:     Palpations: Abdomen is soft.     Tenderness: There is no abdominal tenderness.  Musculoskeletal:     Cervical back: Neck supple.     Comments: 1+ pitting edema up to lower calves No upper calf swelling, tenderness or cords  Lymphadenopathy:     Cervical: No cervical adenopathy.  Neurological:     Mental Status: He is alert.  Psychiatric:        Mood and Affect: Mood normal.            Assessment & Plan:

## 2022-11-30 NOTE — Patient Instructions (Signed)
Please try support socks--- put them on in the morning and remove at bedtime For now, cut the amlodipine in half (so take 5mg  daily)

## 2022-11-30 NOTE — Assessment & Plan Note (Signed)
Doesn't have symptoms to suggest CHF Recent liver/kidney tests normal Not consistent with DVT Likely venous insufficiency---perhaps worse due to amlodipine  Asked him to cut amlodipine in half--take 5mg  daily Try support socks Back to Dr Darnell Level if ongoing problems (??echo)

## 2022-11-30 NOTE — Telephone Encounter (Signed)
Patient spoke with pharmacy assistant and reports his legs have been swollen for a week and getting worse. He does not have anything to take for swelling. Patient's son is aware of issues and has advised patient to be seen.  Of note per chart review, amlodipine was increased from 5 to 10 mg on 10/05/22 per cardiology. Peripheral edema is a common side effect of amlodipine and is dose-dependent.  Routing to triage for assistance.

## 2022-11-30 NOTE — Telephone Encounter (Signed)
Okay---I will check him at the appointment 

## 2022-11-30 NOTE — Telephone Encounter (Signed)
I spoke with pt; pt said for about 1 wk has noticed more swelling in both lower legs in calves and ankles. Pt said his belly is larger than usual but pt said could be more swollen because pt has been eating more. No CP or SOB; pt did say has had some wheezing on and off but none now and sometimes will get winded with exertion. Pt said no pain lower legs unless walks longer distance than usual like walking to mail box and no redness seen. Pt said he has not contacted cardiology; pt said his son thinks he needs to be seen at our office. Pt scheduled appt with Dr Silvio Pate 11/30/22 at 3:30 with UC & ED precautions given and pt voiced understanding. Sending note to Dr Silvio Pate and Silvio Pate pool.

## 2022-11-30 NOTE — Progress Notes (Signed)
Spoke with patient and he stated his legs have been swollen for a week and are getting worse; swelling starts at his knees are goes to the end of his toes. Patient reports his shoes are no longer fitting and it is too painful to walk.  Patient has not been taking anything due to the swelling. Patient's son is aware of his father's condition and advised patient to be seen.  Patient is diabetic. I gave patient Charlene Brooke, PharmD phone number so he can add it to his Dexcom system so we can access all readings. Patient will have his son add the phone number this evening.   Recent blood glucose readings are as follows: 04/03  Before Breakfast - Fasting 313 - Tresiba 32 Units - Novolog 12 Units Before Lunch  363 - 15 Units Novolog Afternoon 195 - No medication Before Dinner  261 - 15 Units Novolog  Bedtime 172 - No medication  04/02 Before Breakfast - Fasting 241 - Tresiba 32 Units - Novolog 12 Units Before Lunch  286- 12 Units Novolog Afternoon No reading Before Dinner  No reading Bedtime   No reading  04/01  Before Breakfast - Fasting 186 - Tresiba 32 Units - Novolog 14 Units Before Lunch  273 - 12 Units Novolog Afternoon No reading Before Dinner  No reading  Bedtime No reading  03/28 Before Breakfast - Fasting 278 - Tresiba 32 Units - Novolog 12 Units Before Lunch  331 - 15 Units Novolog Afternoon 333 -  Novolog 12 Units Before Dinner   301 - No medication  Bedtime No reading  Typical breakfast - 2 pieces of toast 2 scrambled eggs with coffee w/splenda Lunch - fast food or sandwich at home Dinner  - last night was roast beef, carrots and potatoes Snacks - does not eat many snacks Dessert - No dessert Drinks - Diet Coke usually 1 at each meal - nothing between meals Alcohol - None  Charlene Brooke, PharmD notified  [name], [place] Clinical Pharmacy Assistant [phone]

## 2022-12-04 ENCOUNTER — Ambulatory Visit (INDEPENDENT_AMBULATORY_CARE_PROVIDER_SITE_OTHER): Payer: Medicare HMO | Admitting: Podiatry

## 2022-12-04 DIAGNOSIS — Z91199 Patient's noncompliance with other medical treatment and regimen due to unspecified reason: Secondary | ICD-10-CM

## 2022-12-04 NOTE — Progress Notes (Signed)
1. No-show for appointment    Patient with h/o mild cognitive impairment with memory loss. No charge.

## 2022-12-06 ENCOUNTER — Encounter: Payer: Medicare HMO | Admitting: Pharmacist

## 2022-12-06 NOTE — Progress Notes (Signed)
  Care Coordination Note  12/06/2022 Name: Anthony [name] MRN: [id number] DOB: Mar 19, 1946  Anthony [name] is a 77 y.o. year old male who is a primary care patient of [name], MD and is actively engaged with the care management team. I reached out to [name] by phone today to assist with re-scheduling a follow up visit with the RN Case Manager  Follow up plan: Telephone appointment with care management team member scheduled for:  12/14/2022  Burman Nieves, [facility] Care Coordination Care Guide Direct Dial: [phone]

## 2022-12-14 ENCOUNTER — Ambulatory Visit: Payer: Self-pay

## 2022-12-14 NOTE — Patient Outreach (Signed)
  Care Coordination   Follow Up Visit Note   12/14/2022 Name: Anthony [name] MRN: [id number] DOB: 03/30/1946  Anthony [name] is a 77 y.o. year old male who sees Anthony Boyden, MD for primary care. I spoke with  Anthony [name] by phone today.  What matters to the patients health and wellness today?  Patient states he's had bilateral leg swelling that he saw the doctor for recently. Denies any change in treatment plan at this visit.  He states the swelling caused some discomfort.  He reports he has difficulty with ambulation due to having issues with his knees.  He states he was told he needed to have bilateral knee replacement surgery but states he was unable to be cleared from surgery by his endocrinologist.   Patient states the swelling in his legs has decreased some. He states he is scheduled to see his primary care provider on tomorrow.  Patient reports his blood sugar has run low in the pm and early am for the last 2 weeks.  He states his DEXCOM alarms him if blood sugar around 80. Patient states he treats the low blood sugars with 3-4 glucose tablets. Patient states his wife has dementia so he has to do pretty much everything. Reports having children that live local but states they do not provide much assistance. Patient states wife resists any outside help.    Goals Addressed             This Visit's Progress    Patient Stated: Controlling my blood sugars       Interventions Today    Flowsheet Row Most Recent Value  Chronic Disease   Chronic disease during today's visit Diabetes, Other  [bilateral leg swelling]  General Interventions   General Interventions Discussed/Reviewed General Interventions Reviewed, Doctor Visits  [evaluation of current treatment plan for diabetes, bilateral leg swelling and patients adherence to plan as established by provider.  Assessed for BS reading and for leg swelling.]  Doctor Visits Discussed/Reviewed Doctor Visits Reviewed  [facility]  scheduled/ upcoming appointment. Collaborted with Anthony [name], practice pharmacist regarding patients consistent low blood sugars / medications.  Message sent to Dr. Patrice [name], pcp regarding patients ongoing hypoglycemic events.]  Education Interventions   Education Provided Provided Education  [Advised to consider hiring house keeper a few days per month and care aid to assist wife.  Offered patient to follow up with social worker again. Re-discussed hypoglycemic management.]  Provided Verbal Education On Other, Nutrition  [Advised to eat a bedtime healthy snack to help with early morning low blood sugars. Advised to elevate legs when sitting and wear compression hose. Advised patient compression hose can be purchased at medical supply company.]  Pharmacy Interventions   Pharmacy Dicussed/Reviewed Pharmacy Topics Reviewed                SDOH assessments and interventions completed:  No     Care Coordination Interventions:  Yes, provided   Follow up plan: Follow up call scheduled for 01/17/23    Encounter Outcome:  Pt. Visit Completed   George Ina RN,BSN,CCM [facility] Care Coordination [phone] direct line

## 2022-12-15 ENCOUNTER — Encounter: Payer: Self-pay | Admitting: Family Medicine

## 2022-12-15 ENCOUNTER — Ambulatory Visit (INDEPENDENT_AMBULATORY_CARE_PROVIDER_SITE_OTHER): Payer: Medicare HMO | Admitting: Family Medicine

## 2022-12-15 VITALS — BP 128/74 | HR 75 | Temp 97.3°F | Ht 70.5 in | Wt 221.2 lb

## 2022-12-15 DIAGNOSIS — I10 Essential (primary) hypertension: Secondary | ICD-10-CM

## 2022-12-15 DIAGNOSIS — Z794 Long term (current) use of insulin: Secondary | ICD-10-CM

## 2022-12-15 DIAGNOSIS — E11649 Type 2 diabetes mellitus with hypoglycemia without coma: Secondary | ICD-10-CM

## 2022-12-15 DIAGNOSIS — G3184 Mild cognitive impairment, so stated: Secondary | ICD-10-CM

## 2022-12-15 DIAGNOSIS — E113299 Type 2 diabetes mellitus with mild nonproliferative diabetic retinopathy without macular edema, unspecified eye: Secondary | ICD-10-CM

## 2022-12-15 DIAGNOSIS — R600 Localized edema: Secondary | ICD-10-CM

## 2022-12-15 LAB — POCT GLYCOSYLATED HEMOGLOBIN (HGB A1C): Hemoglobin A1C: 7.7 % — AB (ref 4.0–5.6)

## 2022-12-15 MED ORDER — TIRZEPATIDE 2.5 MG/0.5ML ~~LOC~~ SOAJ: 2.5000 mg | SUBCUTANEOUS | 3 refills | Status: DC

## 2022-12-15 MED ORDER — AMLODIPINE BESYLATE 5 MG PO TABS: 5.0000 mg | ORAL_TABLET | Freq: Every day | ORAL | 3 refills | Status: DC

## 2022-12-15 MED ORDER — GLUCOSE 4 G PO CHEW
2.0000 | CHEWABLE_TABLET | Freq: Once | ORAL | Status: AC
  Administered 2022-12-15: 8 g via ORAL

## 2022-12-15 MED ORDER — GLUCOSE 4 G PO CHEW: 1.0000 | CHEWABLE_TABLET | Freq: Once | ORAL | Status: DC

## 2022-12-15 NOTE — Assessment & Plan Note (Signed)
Chronic, remains stable on lower amlodipine - continue this along with benicar hct 40/25mg .

## 2022-12-15 NOTE — Assessment & Plan Note (Addendum)
Chronic longstanding brittle diabetes, discharged from [facility] 08/2022, now followed by PCP.  He continues using Dexcom G7 CGM.  He stopped ozempic due to significant anorexia.  He continues metformin and tresiba 32u daily, as well as novolog per sliding scale (12, 15, or 18u with meals).  He notes ongoing concern over low sugars - necessitating frequent use of glucose tablets. Today presented with sugar 89, s/p bag of crackers and 2 glucose tablets.  Mild cognitive impairment likely complicates care.  Will trial mounjaro 2.5mg  weekly, RTC 1 month. I wonder if we can manage his diabetes without mealtime correction - as this would significantly simplify his regimen - will work towards this. A1c today 7.7%.

## 2022-12-15 NOTE — Patient Instructions (Addendum)
Stay on lower amlodipine dose  daily. New dose sent to pharmacy.  Stay off ozempic. Continue metformin XR and Tresiba.  Trial Mounjaro 2.5mg  weekly - new dose sent to pharmacy.  If sugars are improving, start dropping novolog to 5 units with meals.  Return to see me in 4 weeks.

## 2022-12-15 NOTE — Assessment & Plan Note (Signed)
Improved pedal edema since lowering amlodipine dose, BP remains stable. Will continue lower  amlodipine.

## 2022-12-15 NOTE — Progress Notes (Signed)
Ph: [phone]       Fax: [phone]   Patient ID: Anthony [name], male    DOB: 09-09-1945, 77 y.o.   MRN: [id number]  This visit was conducted in person.  BP 128/74   Pulse 75   Temp (!) 97.3 F (36.3 C) (Temporal)   Ht 5' 10.5" (1.791 m)   Wt 221 lb 4 oz (100.4 kg)   SpO2 96%   BMI 31.30 kg/m    CC: 3 mo DM f/u visit  Subjective:   HPI: Anthony [name] is a 77 y.o. male presenting on 12/15/2022 for Medical Management of Chronic Issues (Here for 3 mo DM f/u.)   Saw Dr Alphonsus Sias 4/4 with pedal edema recommended compression stockings. He hasn't tried this. Amlodipine dose lowered to  with improvement - will continue lower dose.   EMS has not needed to come out to house since he started using CGM.   DM - does regularly check sugars and brings log, data reviewed - average 30d sugar 221, 32% in range, <1% low, 33% high, 34% very high. Compliant with antihyperglycemic regimen which includes: metformin XR  daily, tresiba 32u daily in am, novolog 12-18u TID with meals per SSI. Ozempic stopped 2 months ago - due to significantly decreased appetite - couldn't force himself to eat. Denies low sugars or hypoglycemic symptoms. Denies paresthesias, blurry vision. Last diabetic eye exam 10/2022. Glucometer brand: Dexcom G7. Last foot exam: 02/2022. DSME: remotely, interested in diabetes refresher course.  Lab Results  Component Value Date   HGBA1C 7.7 (A) 12/15/2022   Diabetic Foot Exam - Simple   No data filed    Lab Results  Component Value Date   MICROALBUR 6.6 (H) 04/14/2022         Relevant past medical, surgical, family and social history reviewed and updated as indicated. Interim medical history since our last visit reviewed. Allergies and medications reviewed and updated. Outpatient Medications Prior to Visit  Medication Sig Dispense Refill   aspirin EC 81 MG tablet Take 1 tablet (81 mg total) by mouth daily. Swallow whole. 30 tablet 12   b complex vitamins  tablet Take 1 tablet by mouth daily.     Continuous Blood Gluc Sensor (DEXCOM G7 SENSOR) MISC by Does not apply route.     Dextrose, Diabetic Use, (GLUCOSE PO) Take 1 tablet by mouth as needed.     donepezil (ARICEPT) 5 MG tablet Take 1 tablet (5 mg total) by mouth at bedtime. 30 tablet 6   glucose blood (ONETOUCH ULTRA) test strip USE TO check blood glucose four times daily AS DIRECTED 150 strip 3   GVOKE HYPOPEN 1-PACK 1 MG/0.2ML SOAJ Inject under skin 0.2 mL as needed for hypoglycemia 0.2 mL 11   insulin aspart (NOVOLOG FLEXPEN) 100 UNIT/ML FlexPen Inject 12-20 Units into the skin 3 (three) times daily before meals. 60 mL 1   insulin degludec (TRESIBA FLEXTOUCH) 200 UNIT/ML FlexTouch Pen inject 32 units into THE SKIN DAILY 27 mL 2   Insulin Pen Needle (ADVOCATE INSULIN PEN NEEDLES) 31G X 5 MM MISC Use 3x a day 300 each 3   metFORMIN (GLUCOPHAGE-XR) 500 MG 24 hr tablet TAKE ONE TABLET BY MOUTH EVERY MORNING 30 tablet 3   olmesartan-hydrochlorothiazide (BENICAR HCT) 40-25 MG tablet Take 1 tablet by mouth daily. 90 tablet 3   pantoprazole (PROTONIX) 40 MG tablet Take 1 tablet (40 mg total) by mouth daily. 90 tablet 3   simvastatin (ZOCOR) 20 MG tablet TAKE  1 TABLET BY MOUTH EVERYDAY AT BEDTIME 90 tablet 3   tadalafil (CIALIS) 20 MG tablet TAKE ONE-HALF TO 1 TABLET BY MOUTH EVERY OTHER DAY AS NEEDED 5 tablet 5   amLODipine (NORVASC) 10 MG tablet Take 1 tablet (10 mg total) by mouth daily. 90 tablet 3   OZEMPIC, 1 MG/DOSE, 4 MG/3ML SOPN Inject 1 mg into the skin once a week. 9 mL 3   No facility-administered medications prior to visit.     Per HPI unless specifically indicated in ROS section below Review of Systems  Objective:  BP 128/74   Pulse 75   Temp (!) 97.3 F (36.3 C) (Temporal)   Ht 5' 10.5" (1.791 m)   Wt 221 lb 4 oz (100.4 kg)   SpO2 96%   BMI 31.30 kg/m   Wt Readings from Last 3 Encounters:  12/15/22 221 lb 4 oz (100.4 kg)  11/30/22 229 lb (103.9 kg)  10/05/22 219 lb  (99.3 kg)      Physical Exam Vitals and nursing note reviewed.  Constitutional:      Appearance: Normal appearance. He is not ill-appearing.  Cardiovascular:     Rate and Rhythm: Normal rate and regular rhythm.     Pulses: Normal pulses.     Heart sounds: Normal heart sounds. No murmur heard. Pulmonary:     Effort: Pulmonary effort is normal. No respiratory distress.     Breath sounds: Normal breath sounds. No wheezing, rhonchi or rales.  Musculoskeletal:     Right lower leg: Edema (1+) present.     Left lower leg: Edema (1+) present.  Skin:    General: Skin is warm and dry.     Findings: No rash.  Neurological:     Mental Status: He is alert.  Psychiatric:        Mood and Affect: Mood normal.        Behavior: Behavior normal.       Results for orders placed or performed in visit on 12/15/22  POCT glycosylated hemoglobin (Hb A1C)  Result Value Ref Range   Hemoglobin A1C 7.7 (A) 4.0 - 5.6 %   HbA1c POC (<> result, manual entry)     HbA1c, POC (prediabetic range)     HbA1c, POC (controlled diabetic range)     No results found for: "IRON", "TIBC", "FERRITIN"  Lab Results  Component Value Date   ALT 11 09/15/2022   AST 12 09/15/2022   ALKPHOS 94 09/15/2022   BILITOT 0.6 09/15/2022    Lab Results  Component Value Date   WBC 7.0 02/24/2022   HGB 13.7 02/24/2022   HCT 40.7 02/24/2022   MCV 89.8 02/24/2022   PLT 230 02/24/2022    Assessment & Plan:   Problem List Items Addressed This Visit     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      Relevant Medications   tirzepatide [facility]) 2.5 MG/0.5ML Pen   glucose chewable tablet 8 g   Hypertension    Chronic, remains stable on lower amlodipine - continue this along with benicar hct 40/25mg .       Relevant Medications   amLODipine (NORVASC) 5 MG tablet   MCI (mild cognitive impairment) with memory loss    Continue aricept   nightly, seems to be tolerating well.       Type 2 diabetes mellitus with hypoglycemia - Primary    Chronic longstanding brittle diabetes, discharged from [facility] 08/2022, now followed by PCP.  He continues using Dexcom G7 CGM.  He stopped ozempic due to significant anorexia.  He continues metformin and tresiba 32u daily, as well as novolog per sliding scale (12, 15, or 18u with meals).  He notes ongoing concern over low sugars - necessitating frequent use of glucose tablets. Today presented with sugar 89, s/p bag of crackers and 2 glucose tablets.  Mild cognitive impairment likely complicates care.  Will trial mounjaro 2.5mg  weekly, RTC 1 month. I wonder if we can manage his diabetes without mealtime correction - as this would significantly simplify his regimen - will work towards this. A1c today 7.7%.       Relevant Medications   tirzepatide (MOUNJARO) 2.5 MG/0.5ML Pen   glucose chewable tablet 8 g   Other Relevant Orders   POCT glycosylated hemoglobin (Hb A1C) (Completed)   Pedal edema    Improved pedal edema since lowering amlodipine dose, BP remains stable. Will continue lower 5mg  amlodipine.         Meds ordered this encounter  Medications   amLODipine (NORVASC) 5 MG tablet    Sig: Take 1 tablet (5 mg total) by mouth daily. For blood pressure    Dispense:  90 tablet    Refill:  3    Note new lower dose (pedal edema with 10mg )   tirzepatide (MOUNJARO) 2.5 MG/0.5ML Pen    Sig: Inject 2.5 mg into the skin once a week. For diabetes    Dispense:  2 mL    Refill:  3    In place of ozempic   DISCONTD: glucose chewable tablet 4 g   glucose chewable tablet 8 g    Orders Placed This Encounter  Procedures   POCT glycosylated hemoglobin (Hb A1C)    Patient Instructions  Stay on lower amlodipine dose 5mg  daily. New dose sent to pharmacy.  Stay off ozempic. Continue metformin XR and Tresiba.  Trial Mounjaro 2.5mg  weekly - new dose sent to pharmacy.  If sugars are improving,  start dropping novolog to 5 units with meals.  Return to see me in 4 weeks.   Follow up plan: Return in about 1 month (around 01/14/2023) for follow up visit.  [name], MD

## 2022-12-15 NOTE — Assessment & Plan Note (Signed)
Continue yearly eye exams, last 10/2022.

## 2022-12-15 NOTE — Assessment & Plan Note (Signed)
Continue aricept  nightly, seems to be tolerating well.

## 2022-12-18 DIAGNOSIS — E113299 Type 2 diabetes mellitus with mild nonproliferative diabetic retinopathy without macular edema, unspecified eye: Secondary | ICD-10-CM | POA: Diagnosis not present

## 2022-12-18 DIAGNOSIS — Z794 Long term (current) use of insulin: Secondary | ICD-10-CM | POA: Diagnosis not present

## 2022-12-20 ENCOUNTER — Other Ambulatory Visit: Payer: Self-pay | Admitting: Internal Medicine

## 2022-12-20 DIAGNOSIS — E113299 Type 2 diabetes mellitus with mild nonproliferative diabetic retinopathy without macular edema, unspecified eye: Secondary | ICD-10-CM

## 2022-12-21 ENCOUNTER — Telehealth: Payer: Self-pay

## 2022-12-21 NOTE — Progress Notes (Signed)
Care Management & Coordination Services Pharmacy Team  Reason for Encounter: Appointment Reminder  Contacted patient to confirm telephone appointment with Al Corpus, PharmD on 12/27/2022 at 3:45.  Spoke with patient on 12/21/2022   Do you have any problems getting your medications? No  What is your top health concern you would like to discuss at your upcoming visit? No concerns  Have you seen any other providers since your last visit with PCP? No  Star Rating Drugs:  Medication:   Last Fill: DS: Metformin 500 mg  12/05/2022 30 Olmesartan-HCTZ 40-25 mg 12/05/2022 30 Simvastatin 20 mg  12/05/2022 30  Care Gaps: Annual wellness visit in last year? Yes 04/13/2022  If Diabetic: Last eye exam / retinopathy screening: Up to date Last diabetic foot exam: Up to date  Al Corpus, PharmD notified  Claudina Lick, [place] Clinical Pharmacy Assistant [phone]

## 2022-12-25 ENCOUNTER — Telehealth: Payer: Self-pay | Admitting: Family Medicine

## 2022-12-25 NOTE — Telephone Encounter (Signed)
Called patient to schedule Medicare Annual Wellness Visit (AWV). Left message for patient to call back and schedule Medicare Annual Wellness Visit (AWV).  Last date of AWV: 04/09/2022  Please schedule an appointment at any time with [facility].  If any questions, please contact me at [phone].  Thank you ,  Randon Goldsmith Care Guide [facility] AWV TEAM Direct Dial: [phone]

## 2022-12-27 ENCOUNTER — Ambulatory Visit: Payer: Medicare HMO | Admitting: Pharmacist

## 2022-12-27 NOTE — Progress Notes (Signed)
Care Management & Coordination Services Pharmacy Note  12/27/2022 Name:  Anthony [name] MRN:  [id number] DOB:  1946-03-11  Summary: F/U visit -DM: A1c 7.7% (11/2022), though query accuracy of A1c as CGM showed significantly worse control (GMI > 8.5%); pt received Mounjaro but has not started yet, he is not sure how to use it -Today: BG 400 (after breakfast), then took Tresiba 32, Novolog 18 units ~11am; glucose now 101 (3:30 pm)   Recommendations/Changes made from today's visit: -Scheduled OV to go over [place] and connect Dexcom to [facility] account  Follow up plan: -Pharmacist follow up OV 01/01/23 -PCP appt 01/15/23    Subjective: Anthony [name] is an 77 y.o. year old male who is a primary patient of [name], MD.  The care coordination team was consulted for assistance with disease management and care coordination needs.    Engaged with patient by telephone for follow up visit.  Recent office visits: 12/15/22 Dr Sharen Hones OV: f/u - continue amlodipine 5 mg. TIR 32%, 33% high, 34% very high. Trial Mounjaro 2.5 mg weekly. Start dropping novolog to 5 units/meal.  11/30/22 Dr Alphonsus Sias OV: edema - try support socks. Cut amlodipine in 1/2.  09/15/22 Dr Sharen Hones OV: f/u - start donepezil 5 mg. Encouraged Novolog 15 min prior to meals. Refer to CC for caregiver strain.  04/19/22 Dr Sharen Hones OV: annual - no changes. MMSE 24/30 - mild memory loss. F/u 3 months - memory.   Recent consult visits: 10/05/22 Dr Izora Ribas (Cardiology): CAD - start aspirin 81 mg. Increase amlodipine to 10 mg. Ordered PFT  09/13/22 Dr Elvera Lennox (Endocrine): A1c 7.2%; GMI 8.6% (TIR 35%); pt taking Ozempic 0.5 mg weekly; advised centralizing medical care and directed to PCP for DM control.   03/06/22 NP Nada Boozer (Cardiology): preop clearance, acceptable risk.   03/02/22 Dr Elvera Lennox (Endocrine): DM - A1c 7.3%; insulin compliance is main issue - not taking Novolog in AM or out to dinner; CGM - TIR 34%, GMI  8.6%; BG is swinging widely, cannot be cleared for knee surgery. No changes to prescribed regimen, just improve compliance.  Hospital visits: None in previous 6 months   Objective:  Lab Results  Component Value Date   CREATININE 0.98 09/15/2022   BUN 22 09/15/2022   GFR 76.10 09/15/2022   GFRNONAA >60 03/02/2020   GFRAA >60 03/02/2020   NA 139 09/15/2022   K 4.1 09/15/2022   CALCIUM 9.1 09/15/2022   CO2 28 09/15/2022   GLUCOSE 279 (H) 09/15/2022    Lab Results  Component Value Date/Time   HGBA1C 7.7 (A) 12/15/2022 11:39 AM   HGBA1C 7.2 (A) 09/13/2022 11:16 AM   HGBA1C 7.7 (H) 03/02/2020 10:26 AM   HGBA1C 7.8 (H) 01/12/2020 10:21 AM   FRUCTOSAMINE 284 09/15/2022 11:47 AM   FRUCTOSAMINE 314 (H) 04/14/2022 10:55 AM   GFR 76.10 09/15/2022 11:47 AM   GFR 81.27 04/14/2022 10:54 AM   MICROALBUR 6.6 (H) 04/14/2022 10:54 AM   MICROALBUR 6.3 (H) 06/28/2015 09:39 AM    Last diabetic Eye exam:  Lab Results  Component Value Date/Time   HMDIABEYEEXA No Retinopathy 11/21/2022 12:00 AM    Last diabetic Foot exam:  Lab Results  Component Value Date/Time   HMDIABFOOTEX done by podiatry - in chart 05/19/2019 12:00 AM     Lab Results  Component Value Date   CHOL 128 04/14/2022   HDL 40.80 04/14/2022   LDLCALC 64 04/14/2022   LDLDIRECT 94.0 10/05/2016   TRIG 115.0 04/14/2022  CHOLHDL 3 04/14/2022       Latest Ref Rng & Units 09/15/2022   11:47 AM 04/14/2022   10:54 AM 08/08/2021    3:54 PM  Hepatic Function  Total Protein 6.0 - 8.3 g/dL 6.3  6.1    Albumin 3.5 - 5.2 g/dL 4.1  4.0    AST 0 - 37 U/L 12  13    ALT 0 - 53 U/L 11  14  14    Alk Phosphatase 39 - 117 U/L 94  83    Total Bilirubin 0.2 - 1.2 mg/dL 0.6  1.0      Lab Results  Component Value Date/Time   TSH 1.97 09/15/2022 11:47 AM   TSH 2.05 04/14/2022 10:55 AM   FREET4 0.88 06/26/2014 09:05 AM       Latest Ref Rng & Units 02/24/2022    4:09 PM 03/02/2020   10:26 AM 01/05/2017   12:28 PM  CBC  WBC 3.8 -  10.8 Thousand/uL 7.0  8.5  5.7   Hemoglobin 13.2 - 17.1 g/dL 40.9  81.1  91.4   Hematocrit 38.5 - 50.0 % 40.7  42.8  41.3   Platelets 140 - 400 Thousand/uL 230  227  209.0     Lab Results  Component Value Date/Time   VITAMINB12 418 09/15/2022 11:47 AM   VITAMINB12 707 03/08/2021 03:08 PM    Clinical ASCVD: Yes  The ASCVD Risk score (Arnett DK, et al., 2019) failed to calculate for the following reasons:   The valid total cholesterol range is 130 to 320 mg/dL       7/82/9562   13:08 AM 09/20/2022    2:38 PM 09/15/2022   11:19 AM  Depression screen PHQ 2/9  Decreased Interest 0 0 0  Down, Depressed, Hopeless 0 0 0  PHQ - 2 Score 0 0 0  Altered sleeping   2  Tired, decreased energy   1  Change in appetite   0  Feeling bad or failure about yourself    0  Trouble concentrating   1  Moving slowly or fidgety/restless   0  Suicidal thoughts   0  PHQ-9 Score   4  Difficult doing work/chores   Somewhat difficult     Social History   Tobacco Use  Smoking Status Former   Packs/day: 0.30   Years: 50.00   Additional pack years: 0.00   Total pack years: 15.00   Types: Cigarettes   Quit date: 08/29/2011   Years since quitting: 11.3  Smokeless Tobacco Never   BP Readings from Last 3 Encounters:  12/15/22 128/74  11/30/22 136/66  10/05/22 (!) 152/82   Pulse Readings from Last 3 Encounters:  12/15/22 75  11/30/22 64  10/05/22 75   Wt Readings from Last 3 Encounters:  12/15/22 221 lb 4 oz (100.4 kg)  11/30/22 229 lb (103.9 kg)  10/05/22 219 lb (99.3 kg)   BMI Readings from Last 3 Encounters:  12/15/22 31.30 kg/m  11/30/22 32.39 kg/m  10/05/22 29.70 kg/m    No Known Allergies  Medications Reviewed Today     Reviewed by Kathyrn Sheriff, RPH (Pharmacist) on 12/27/22 at 1542  Med List Status: <None>   Medication Order Taking? Sig Documenting Provider Last Dose Status Informant  amLODipine (NORVASC) 5 MG tablet [id number] Yes Take 1 tablet (5 mg total) by mouth  daily. For blood pressure [name], MD Taking Active   aspirin EC 81 MG tablet [id number] Yes Take 1 tablet (81 mg  total) by mouth daily. Swallow whole. Christell Constant, MD Taking Active   b complex vitamins tablet [id number] Yes Take 1 tablet by mouth daily. [provider] Taking Active Self  Continuous Blood Gluc Sensor (DEXCOM G7 SENSOR) MISC [id number] Yes by Does not apply route. [provider] Taking Active   Dextrose, Diabetic Use, (GLUCOSE PO) [id number] Yes Take 1 tablet by mouth as needed. [provider] Taking Active Self  donepezil (ARICEPT) 5 MG tablet [id number] Yes Take 1 tablet (5 mg total) by mouth at bedtime. [name], MD Taking Active   GVOKE HYPOPEN 1-PACK 1 MG/0.2ML Ivory Broad [id number] Yes Inject under skin 0.2 mL as needed for hypoglycemia Carlus Pavlov, MD Taking Active   insulin aspart (NOVOLOG FLEXPEN) 100 UNIT/ML FlexPen [id number] Yes Inject 12-20 Units into the skin 3 (three) times daily before meals. Carlus Pavlov, MD Taking Active   insulin degludec (TRESIBA FLEXTOUCH) 200 UNIT/ML FlexTouch Pen [id number] Yes inject 32 units into THE SKIN DAILY Carlus Pavlov, MD Taking Active   Insulin Pen Needle (ADVOCATE INSULIN PEN NEEDLES) 31G X 5 MM MISC [id number] Yes Use 3x a day Carlus Pavlov, MD Taking Active Self  metFORMIN (GLUCOPHAGE-XR) 500 MG 24 hr tablet [id number] Yes TAKE ONE TABLET BY MOUTH EVERY Clayburn Pert, MD Taking Active   olmesartan-hydrochlorothiazide (BENICAR HCT) 40-25 MG tablet [id number] Yes Take 1 tablet by mouth daily. [name], MD Taking Active   [facility] ULTRA test strip [id number] Yes USE TO check blood glucose four times daily AS DIRECTED Carlus Pavlov, MD Taking Active   pantoprazole (PROTONIX) 40 MG tablet [id number] Yes Take 1 tablet (40 mg total) by mouth daily. [name], MD Taking Active   simvastatin (ZOCOR) 20 MG tablet [id number] Yes TAKE 1 TABLET BY MOUTH  EVERYDAY AT BEDTIME [name], MD Taking Active   tadalafil (CIALIS) 20 MG tablet [id number] Yes TAKE ONE-HALF TO 1 TABLET BY MOUTH EVERY OTHER DAY AS NEEDED [name], MD Taking Active   tirzepatide [facility]) 2.5 MG/0.5ML Pen [id number] No Inject 2.5 mg into the skin once a week. For diabetes  Patient not taking: Reported on 12/27/2022   [name], MD Not Taking Active             SDOH:  (Social Determinants of Health) assessments and interventions performed: Yes SDOH Interventions    Flowsheet Row Care Coordination from 09/20/2022 in [facility] Community Care Coordination Care Coordination from 09/07/2022 in [facility] Chronic Care Management from 02/15/2021 in [facility] Chronic Care Management from 01/20/2021 in [facility] at [facility] Chronic Care Management from 10/15/2020 in [facility] at [facility] Chronic Care Management from 08/25/2020 in [facility] at [place]  SDOH Interventions        Food Insecurity Interventions Intervention Not Indicated -- Intervention Not Indicated -- -- --  Housing Interventions Intervention Not Indicated Intervention Not Indicated Intervention Not Indicated -- -- --  Transportation Interventions Intervention Not Indicated -- Intervention Not Indicated -- -- --  Financial Strain Interventions -- Intervention Not Indicated -- Other (Comment)  [Apply for Ozempic assistance in coverage gap] Intervention Not Indicated  [Medications affordable] Intervention Not Indicated      SDOH Screenings   Food Insecurity: No Food Insecurity (09/20/2022)  Housing: Low Risk  (09/20/2022)  Transportation Needs: No Transportation Needs (09/20/2022)  Alcohol Screen: Low Risk  (01/12/2020)  Depression (PHQ2-9): Low Risk  (12/15/2022)  Financial Resource Strain: Low  Risk  (09/07/2022)  Physical Activity: Inactive (04/13/2022)   Stress: No Stress Concern Present (04/13/2022)  Tobacco Use: Medium Risk (12/15/2022)    Medication Assistance: None required.  Patient affirms current coverage meets needs.  Medication Access: Within the past 30 days, how often has patient missed a dose of medication? 0 Is a pillbox or other method used to improve adherence? Yes  Factors that may affect medication adherence? lack of understanding of disease management Are meds synced by current pharmacy? Yes  Are meds delivered by current pharmacy? Yes  Does patient experience delays in picking up medications due to transportation concerns? No   [facility] Reviewed: Is patient disadvantaged to use [facility]?: No  Current Rx insurance plan: Community education officer Name and location of Current pharmacy:  [facility] - [place], [place] - [address] [place] [address] Phone: [phone] Fax: [phone]  [id number] - [place], [place] - [address] [place] [address] Phone: [phone] Fax: [phone]  [facility] services reviewed with patient today?: Yes  30-day vials: Delivery 12/11/22 Simvastatin 20 mg Take 1 tablet every day at bedtime OneTouch Test strips - Use up to four times daily Tadalafil 20 mg - 1/2 to 1 tablet daily as needed- PRN use Olmesartan/HCTZ 40-25 mg Take 1 tablet by mouth every day Amlodipine 10 mg 1 tablet daily Pantoprazole 40 mg 1 tablet daily Novolog Flexpen Inject 12-20 units into the skin 3 times daily with meals Tresiba Flex 200 units- 32 units once daily in morning Donepezil 5mg - take 1 tablet bedtime  Metformin XR 500mg -take 1 tablet in morning   Compliance/Adherence/Medication fill history: Care Gaps: None  Star-Rating Drugs: Simvastatin - PDC 99% Olmesartan-HCTZ - PDC 100% Metformin - PDC 61%; only filling regularly since 08/2022     ASSESSMENT / PLAN  Diabetes (A1c goal <8%) -Not  ideally controlled - A1c 7.7% (11/2022), increase from 7.3% previously; A1c has not been accurate in comparison to CGM data which shows worse control; he stopped Ozempic for several months due to fear of side effects -Today: BG 400 (after breakfast), then took Tresiba 32, Novolog 18 11am; glucose now 101 -No longer following with endocrine; hx severe hypoglycemia and memory impairment -DM dx 2006, insulin since 2008 -Current meal patterns: eats 2 meals/day (late breakfast ~11 AM, early dinner ~ 5 PM) -Current exercise: tries to walk when he can -Current medications: Metformin 500 mg daily AM -  Appropriate, Effective, Safe, Accessible Novolog 08-11-17 u w meals - Appropriate, Query Effective Tresiba U200 32 units daily - Appropriate, Query Effective Mounjaro 2.5 mg weekly - not started yet Gvoke Hypopen PRN - not used Dexcom G7 -Appropriate, Effective, Safe, Accessible -Medications previously tried: Ozempic 1 mg (low appetite) -Educated on A1c and blood sugar goals; consequences of high BG on wound healing/surgery outcomes -Reviewed Mounjaro purpose/administration, pt would prefer to go over in person - scheduled OV 01/01/23; pt also wants to connect Dexcom to our office   Hypertension (BP goal <140/90) -Controlled - per OV readings -Current home readings: n/a; Denies hypotensive/hypertensive symptoms -Current treatment: Amlodipine 5 mg daily - Appropriate, Effective, Safe, Accessible Olmesartan-HCTZ 40-25 mg daily -Appropriate, Effective, Safe, Accessible -Medications previously tried: n/a  -Educated on BP goals and benefits of medications for prevention of heart attack, stroke and kidney damage; -Counseled to monitor BP at home periodically -Recommended to continue current medication  Hyperlipidemia: (LDL goal < 70) -Controlled - LDL 64 (03/2022) at goal -Hx  aortic atherosclerosis -Current treatment: Simvastatin 20 mg daily HS - Appropriate, Effective, Safe, Accessible -Medications  previously tried: n/a  -Educated on Cholesterol goals; Benefits of statin for ASCVD risk reduction; -Recommended to continue current medication  MCI -Not ideally controlled -Current treatment  Donepezil 5 mg daily HS -Medications previously tried: n/a  -Recommended to continue current medication  Health Maintenance -Hx COPD, stable period off inhalers   Al Corpus, PharmD, BCACP Clinical Pharmacist [facility] [phone]

## 2022-12-28 ENCOUNTER — Telehealth: Payer: Self-pay

## 2022-12-28 NOTE — Progress Notes (Signed)
Care Management & Coordination Services Pharmacy Team  Reason for Encounter: Medication Coordination and Delivery  Contacted patient to discuss medications and coordinate delivery from [facility].  Spoke with patient on 12/28/2022   Cycle dispensing form sent to Chrys Racer, PharmD for review.   Last adherence delivery date: 12/11/2022      Patient is due for next adherence delivery on: 01/09/2023  This delivery to include:  Vials  30 Days  Easy Open Caps  Simvastatin 20 mg Take 1 tablet every day at bedtime OneTouch Test strips - Use up to four times daily Tadalafil 20 mg - 1/2 to 1 tablet daily as needed- PRN use Olmesartan/HCTZ 40-25 mg Take 1 tablet by mouth every day Amlodipine 10 mg 1 tablet daily Pantoprazole 40 mg 1 tablet daily Novolog Flexpen Inject 12-20 units into the skin 3 times daily with meals Tresiba Flex 200 units- 32 units once daily in morning Donepezil 5mg - take 1 tablet bedtime  Metformin XR 500mg -take 1 tablet in morning    Patient declined the following medications this month: Aspirin 81 mg - 1 tablet daily   Patient gets OTC: Aspirin 81 mg - 1 tablet daily   Patient declined the following medications this month: None  No refill request needed.  Confirmed delivery date of 01/09/2023, advised patient that pharmacy will contact them the morning of delivery.   Any concerns about your medications? No  How often do you forget or accidentally miss a dose? Rarely  Do you use a pillbox? No  Is patient in packaging No   Recent blood pressure readings are as follows: Patient does not take readings at home. Patient would like a blood pressure monitor at home; would like for me to see if he can get one at no cost.    Chart review: Recent office visits:  None since last contact  Recent consult visits:  None since last contact  Hospital visits:  None in previous 6 months  Medications: Outpatient Encounter Medications as of 12/28/2022   Medication Sig   amLODipine (NORVASC) 5 MG tablet Take 1 tablet (5 mg total) by mouth daily. For blood pressure   aspirin EC 81 MG tablet Take 1 tablet (81 mg total) by mouth daily. Swallow whole.   b complex vitamins tablet Take 1 tablet by mouth daily.   Continuous Blood Gluc Sensor (DEXCOM G7 SENSOR) MISC by Does not apply route.   Dextrose, Diabetic Use, (GLUCOSE PO) Take 1 tablet by mouth as needed.   donepezil (ARICEPT) 5 MG tablet Take 1 tablet (5 mg total) by mouth at bedtime.   GVOKE HYPOPEN 1-PACK 1 MG/0.2ML SOAJ Inject under skin 0.2 mL as needed for hypoglycemia   insulin aspart (NOVOLOG FLEXPEN) 100 UNIT/ML FlexPen Inject 12-20 Units into the skin 3 (three) times daily before meals.   insulin degludec (TRESIBA FLEXTOUCH) 200 UNIT/ML FlexTouch Pen inject 32 units into THE SKIN DAILY   Insulin Pen Needle (ADVOCATE INSULIN PEN NEEDLES) 31G X 5 MM MISC Use 3x a day   metFORMIN (GLUCOPHAGE-XR) 500 MG 24 hr tablet TAKE ONE TABLET BY MOUTH EVERY MORNING   olmesartan-hydrochlorothiazide (BENICAR HCT) 40-25 MG tablet Take 1 tablet by mouth daily.   ONETOUCH ULTRA test strip USE TO check blood glucose four times daily AS DIRECTED   pantoprazole (PROTONIX) 40 MG tablet Take 1 tablet (40 mg total) by mouth daily.   simvastatin (ZOCOR) 20 MG tablet TAKE 1 TABLET BY MOUTH EVERYDAY AT BEDTIME   tadalafil (CIALIS) 20 MG  tablet TAKE ONE-HALF TO 1 TABLET BY MOUTH EVERY OTHER DAY AS NEEDED   tirzepatide (MOUNJARO) 2.5 MG/0.5ML Pen Inject 2.5 mg into the skin once a week. For diabetes (Patient not taking: Reported on 12/27/2022)   No facility-administered encounter medications on file as of 12/28/2022.   BP Readings from Last 3 Encounters:  12/15/22 128/74  11/30/22 136/66  10/05/22 (!) 152/82    Pulse Readings from Last 3 Encounters:  12/15/22 75  11/30/22 64  10/05/22 75    Lab Results  Component Value Date/Time   HGBA1C 7.7 (A) 12/15/2022 11:39 AM   HGBA1C 7.2 (A) 09/13/2022 11:16 AM    HGBA1C 7.7 (H) 03/02/2020 10:26 AM   HGBA1C 7.8 (H) 01/12/2020 10:21 AM   Lab Results  Component Value Date   CREATININE 0.98 09/15/2022   BUN 22 09/15/2022   GFR 76.10 09/15/2022   GFRNONAA >60 03/02/2020   GFRAA >60 03/02/2020   NA 139 09/15/2022   K 4.1 09/15/2022   CALCIUM 9.1 09/15/2022   CO2 28 09/15/2022   Al Corpus, CPP notified   Claudina Lick, [place] Clinical Pharmacy Assistant [phone]

## 2023-01-01 ENCOUNTER — Ambulatory Visit: Payer: Medicare HMO | Admitting: Pharmacist

## 2023-01-01 ENCOUNTER — Encounter: Payer: Medicare HMO | Admitting: Pharmacist

## 2023-01-01 NOTE — Progress Notes (Signed)
Care Management & Coordination Services Pharmacy Note  01/01/2023 Name:  Anthony [name] MRN:  [id number] DOB:  Oct 06, 1945  Summary: F/U visit -DM: A1c 7.7% (11/2022), though query accuracy of A1c as CGM showed significantly worse control; pt received Mounjaro but has not started yet; pt also reports frequent hypoglycemia but per CGM it is not actually <70 -Pt does endorse insulin stacking: he will sometimes take a second full dose of Novolog (12 units) within 1-2 hrs of first Novolog 12 units if glucose does not come down; discussed dangers of insulin stacking and risk for hypoglycemia -Reviewed AGP report: 12/19/22 to 01/01/23. Sensor active: 99%  Time in range (70-180): 32% (goal > 70%)  High (180-250): 30%  Very high (>250): 38%  Low (< 70): 0% (goal < 4%)  GMI: 8.7%; Average glucose: 225   Recommendations/Changes made from today's visit: -Demonstrated how to use Mounjaro; pt successfully injected first dose himself today; discussed Greggory Keen will be increased to 5 mg after 4 weeks if tolerated -Provided Novolog sliding scale; discussed hopefully he can reduce Novolog once Mounjaro is titrated up; advised to avoid injecting Novolog within 4 hours of previous dose  Follow up plan: -Pharmacist follow up televisit scheduled for 10 days -PCP appt 01/15/23    Subjective: Anthony [name] is an 77 y.o. year old male who is a primary patient of [name], MD.  The care coordination team was consulted for assistance with disease management and care coordination needs.    Engaged with patient by telephone for follow up visit.  Recent office visits: 12/15/22 Dr Sharen Hones OV: f/u - continue amlodipine 5 mg. TIR 32%, 33% high, 34% very high. Trial Mounjaro 2.5 mg weekly. Start dropping novolog to 5 units/meal.  11/30/22 Dr Alphonsus Sias OV: edema - try support socks. Cut amlodipine in 1/2.  09/15/22 Dr Sharen Hones OV: f/u - start donepezil 5 mg. Encouraged Novolog 15 min prior to meals. Refer to CC  for caregiver strain.  04/19/22 Dr Sharen Hones OV: annual - no changes. MMSE 24/30 - mild memory loss. F/u 3 months - memory.   Recent consult visits: 10/05/22 Dr Izora Ribas (Cardiology): CAD - start aspirin 81 mg. Increase amlodipine to 10 mg. Ordered PFT  09/13/22 Dr Elvera Lennox (Endocrine): A1c 7.2%; GMI 8.6% (TIR 35%); pt taking Ozempic 0.5 mg weekly; advised centralizing medical care and directed to PCP for DM control.   03/06/22 NP Nada Boozer (Cardiology): preop clearance, acceptable risk.   03/02/22 Dr Elvera Lennox (Endocrine): DM - A1c 7.3%; insulin compliance is main issue - not taking Novolog in AM or out to dinner; CGM - TIR 34%, GMI 8.6%; BG is swinging widely, cannot be cleared for knee surgery. No changes to prescribed regimen, just improve compliance.  Hospital visits: None in previous 6 months   Objective:  Lab Results  Component Value Date   CREATININE 0.98 09/15/2022   BUN 22 09/15/2022   GFR 76.10 09/15/2022   GFRNONAA >60 03/02/2020   GFRAA >60 03/02/2020   NA 139 09/15/2022   K 4.1 09/15/2022   CALCIUM 9.1 09/15/2022   CO2 28 09/15/2022   GLUCOSE 279 (H) 09/15/2022    Lab Results  Component Value Date/Time   HGBA1C 7.7 (A) 12/15/2022 11:39 AM   HGBA1C 7.2 (A) 09/13/2022 11:16 AM   HGBA1C 7.7 (H) 03/02/2020 10:26 AM   HGBA1C 7.8 (H) 01/12/2020 10:21 AM   FRUCTOSAMINE 284 09/15/2022 11:47 AM   FRUCTOSAMINE 314 (H) 04/14/2022 10:55 AM   GFR 76.10 09/15/2022 11:47 AM  GFR 81.27 04/14/2022 10:54 AM   MICROALBUR 6.6 (H) 04/14/2022 10:54 AM   MICROALBUR 6.3 (H) 06/28/2015 09:39 AM    Last diabetic Eye exam:  Lab Results  Component Value Date/Time   HMDIABEYEEXA No Retinopathy 11/21/2022 12:00 AM    Last diabetic Foot exam:  Lab Results  Component Value Date/Time   HMDIABFOOTEX done by podiatry - in chart 05/19/2019 12:00 AM     Lab Results  Component Value Date   CHOL 128 04/14/2022   HDL 40.80 04/14/2022   LDLCALC 64 04/14/2022   LDLDIRECT 94.0  10/05/2016   TRIG 115.0 04/14/2022   CHOLHDL 3 04/14/2022       Latest Ref Rng & Units 09/15/2022   11:47 AM 04/14/2022   10:54 AM 08/08/2021    3:54 PM  Hepatic Function  Total Protein 6.0 - 8.3 g/dL 6.3  6.1    Albumin 3.5 - 5.2 g/dL 4.1  4.0    AST 0 - 37 U/L 12  13    ALT 0 - 53 U/L 11  14  14    Alk Phosphatase 39 - 117 U/L 94  83    Total Bilirubin 0.2 - 1.2 mg/dL 0.6  1.0      Lab Results  Component Value Date/Time   TSH 1.97 09/15/2022 11:47 AM   TSH 2.05 04/14/2022 10:55 AM   FREET4 0.88 06/26/2014 09:05 AM       Latest Ref Rng & Units 02/24/2022    4:09 PM 03/02/2020   10:26 AM 01/05/2017   12:28 PM  CBC  WBC 3.8 - 10.8 Thousand/uL 7.0  8.5  5.7   Hemoglobin 13.2 - 17.1 g/dL 16.1  09.6  04.5   Hematocrit 38.5 - 50.0 % 40.7  42.8  41.3   Platelets 140 - 400 Thousand/uL 230  227  209.0     Lab Results  Component Value Date/Time   VITAMINB12 418 09/15/2022 11:47 AM   VITAMINB12 707 03/08/2021 03:08 PM    Clinical ASCVD: Yes  The ASCVD Risk score (Arnett DK, et al., 2019) failed to calculate for the following reasons:   The valid total cholesterol range is 130 to 320 mg/dL       12/04/8117   14:78 AM 09/20/2022    2:38 PM 09/15/2022   11:19 AM  Depression screen PHQ 2/9  Decreased Interest 0 0 0  Down, Depressed, Hopeless 0 0 0  PHQ - 2 Score 0 0 0  Altered sleeping   2  Tired, decreased energy   1  Change in appetite   0  Feeling bad or failure about yourself    0  Trouble concentrating   1  Moving slowly or fidgety/restless   0  Suicidal thoughts   0  PHQ-9 Score   4  Difficult doing work/chores   Somewhat difficult     Social History   Tobacco Use  Smoking Status Former   Packs/day: 0.30   Years: 50.00   Additional pack years: 0.00   Total pack years: 15.00   Types: Cigarettes   Quit date: 08/29/2011   Years since quitting: 11.3  Smokeless Tobacco Never   BP Readings from Last 3 Encounters:  12/15/22 128/74  11/30/22 136/66  10/05/22  (!) 152/82   Pulse Readings from Last 3 Encounters:  12/15/22 75  11/30/22 64  10/05/22 75   Wt Readings from Last 3 Encounters:  12/15/22 221 lb 4 oz (100.4 kg)  11/30/22 229 lb (103.9 kg)  10/05/22 219 lb (99.3 kg)   BMI Readings from Last 3 Encounters:  12/15/22 31.30 kg/m  11/30/22 32.39 kg/m  10/05/22 29.70 kg/m    No Known Allergies  Medications Reviewed Today     Reviewed by Kathyrn Sheriff, [facility] (Pharmacist) on 01/01/23 at 1607  Med List Status: <None>   Medication Order Taking? Sig Documenting Provider Last Dose Status Informant  amLODipine (NORVASC) 5 MG tablet [id number] Yes Take 1 tablet (5 mg total) by mouth daily. For blood pressure [name], MD Taking Active   aspirin EC 81 MG tablet [id number] Yes Take 1 tablet (81 mg total) by mouth daily. Swallow whole. Christell Constant, MD Taking Active   b complex vitamins tablet [id number] Yes Take 1 tablet by mouth daily. [provider] Taking Active Self  Continuous Blood Gluc Sensor (DEXCOM G7 SENSOR) MISC [id number] Yes by Does not apply route. [provider] Taking Active   Dextrose, Diabetic Use, (GLUCOSE PO) [id number] Yes Take 1 tablet by mouth as needed. [provider] Taking Active Self  donepezil (ARICEPT) 5 MG tablet [id number] Yes Take 1 tablet (5 mg total) by mouth at bedtime. [name], MD Taking Active   GVOKE HYPOPEN 1-PACK 1 MG/0.2ML Ivory Broad [id number] Yes Inject under skin 0.2 mL as needed for hypoglycemia Carlus Pavlov, MD Taking Active   insulin aspart (NOVOLOG FLEXPEN) 100 UNIT/ML FlexPen [id number] Yes Inject 12-20 Units into the skin 3 (three) times daily before meals. Carlus Pavlov, MD Taking Active   insulin degludec (TRESIBA FLEXTOUCH) 200 UNIT/ML FlexTouch Pen [id number] Yes inject 32 units into THE SKIN DAILY Carlus Pavlov, MD Taking Active   Insulin Pen Needle (ADVOCATE INSULIN PEN NEEDLES) 31G X 5 MM MISC [id number] Yes Use 3x a day  Carlus Pavlov, MD Taking Active Self  metFORMIN (GLUCOPHAGE-XR) 500 MG 24 hr tablet [id number] Yes TAKE ONE TABLET BY MOUTH EVERY Clayburn Pert, MD Taking Active   olmesartan-hydrochlorothiazide (BENICAR HCT) 40-25 MG tablet [id number] Yes Take 1 tablet by mouth daily. [name], MD Taking Active   [facility] ULTRA test strip [id number] Yes USE TO check blood glucose four times daily AS DIRECTED Carlus Pavlov, MD Taking Active   pantoprazole (PROTONIX) 40 MG tablet [id number] Yes Take 1 tablet (40 mg total) by mouth daily. [name], MD Taking Active   simvastatin (ZOCOR) 20 MG tablet [id number] Yes TAKE 1 TABLET BY MOUTH EVERYDAY AT BEDTIME [name], MD Taking Active   tadalafil (CIALIS) 20 MG tablet [id number] Yes TAKE ONE-HALF TO 1 TABLET BY MOUTH EVERY OTHER DAY AS NEEDED [name], MD Taking Active   tirzepatide [facility]) 2.5 MG/0.5ML Pen [id number] Yes Inject 2.5 mg into the skin once a week. For diabetes [name], MD Taking Active             SDOH:  (Social Determinants of Health) assessments and interventions performed: Yes SDOH Interventions    Flowsheet Row Care Coordination from 09/20/2022 in [facility] Community Care Coordination Care Coordination from 09/07/2022 in [facility] Chronic Care Management from 02/15/2021 in [facility] at [facility] Chronic Care Management from 01/20/2021 in [facility] at [facility] Chronic Care Management from 10/15/2020 in [facility] at [facility] Chronic Care Management from 08/25/2020 in [facility] at [place]  SDOH Interventions        Food Insecurity Interventions Intervention Not Indicated -- Intervention Not Indicated -- -- --  Housing Interventions Intervention Not Indicated Intervention  Not Indicated Intervention Not Indicated -- -- --  Transportation Interventions Intervention Not  Indicated -- Intervention Not Indicated -- -- --  Financial Strain Interventions -- Intervention Not Indicated -- Other (Comment)  [Apply for Ozempic assistance in coverage gap] Intervention Not Indicated  [Medications affordable] Intervention Not Indicated      SDOH Screenings   Food Insecurity: No Food Insecurity (09/20/2022)  Housing: Low Risk  (09/20/2022)  Transportation Needs: No Transportation Needs (09/20/2022)  Alcohol Screen: Low Risk  (01/12/2020)  Depression (PHQ2-9): Low Risk  (12/15/2022)  Financial Resource Strain: Low Risk  (09/07/2022)  Physical Activity: Inactive (04/13/2022)  Stress: No Stress Concern Present (04/13/2022)  Tobacco Use: Medium Risk (12/15/2022)    Medication Assistance: None required.  Patient affirms current coverage meets needs.  Medication Access: Within the past 30 days, how often has patient missed a dose of medication? 0 Is a pillbox or other method used to improve adherence? Yes  Factors that may affect medication adherence? lack of understanding of disease management Are meds synced by current pharmacy? Yes  Are meds delivered by current pharmacy? Yes  Does patient experience delays in picking up medications due to transportation concerns? No   [facility] Reviewed: Is patient disadvantaged to use [facility]?: No  Current Rx insurance plan: Community education officer Name and location of Current pharmacy:  [facility] - [place], [place] - [address] [place] [address] Phone: [phone] Fax: [phone]  [id number] - [place], [place] - [address] [place] [address] Phone: [phone] Fax: [phone]  [facility] services reviewed with patient today?: Yes  30-day vials: Delivery 12/11/22 Simvastatin 20 mg Take 1 tablet every day at bedtime OneTouch Test strips - Use up to four times daily Tadalafil 20 mg - 1/2 to 1 tablet daily as needed- PRN  use Olmesartan/HCTZ 40-25 mg Take 1 tablet by mouth every day Amlodipine 10 mg 1 tablet daily Pantoprazole 40 mg 1 tablet daily Novolog Flexpen Inject 12-20 units into the skin 3 times daily with meals Tresiba Flex 200 units- 32 units once daily in morning Donepezil 5mg - take 1 tablet bedtime  Metformin XR 500mg -take 1 tablet in morning   Compliance/Adherence/Medication fill history: Care Gaps: None  Star-Rating Drugs: Simvastatin - PDC 99% Olmesartan-HCTZ - PDC 100% Metformin - PDC 61%; only filling regularly since 08/2022     ASSESSMENT / PLAN  Diabetes (A1c goal <8%) -Not ideally controlled - A1c 7.7% (11/2022), increase from 7.3% previously; A1c has not been accurate in comparison to CGM data which shows worse control; he stopped Ozempic for several months due to appetite suppression; he was recently prescribed Mounjaro but has not started it yet -No longer following with endocrine; hx severe hypoglycemia and memory impairment -DM dx 2006, insulin since 2008 -Current meal patterns: eats 2 meals/day (late breakfast ~11 AM, early dinner ~ 5 PM) -Current exercise: tries to walk when he can Reviewed AGP report: 12/19/22 to 01/01/23. Sensor active: 99%  Time in range (70-180): 32% (goal > 70%)  High (180-250): 30%  Very high (>250): 38%  Low (< 70): 0% (goal < 4%)  GMI: 8.7%; Average glucose: 225  -Current medications: Metformin 500 mg daily AM -  Appropriate, Effective, Safe, Accessible Novolog 08-11-17 u w meals - Appropriate, Query Effective Tresiba U200 32 units daily - Appropriate, Query Effective Mounjaro 2.5 mg weekly - started today Gvoke Hypopen PRN - not used Dexcom G7 -Appropriate, Effective, Safe, Accessible -Medications  previously tried: Ozempic 1 mg (low appetite) -Educated on A1c and blood sugar goals; consequences of high BG on wound healing/surgery outcomes -Demonstrated how to inject Mounjaro; pt successfully injected first dose himself today; Discussed  Mounjaro will be titrated monthly and hopefully he can reduce or come off Novolog; need to be cautious with titrating Mounjaro in general with hx of appetite suppression with Ozempic -Recommend to continue current medication; provided sliding scale for Novolog  Hypertension (BP goal <140/90) -Controlled - per OV readings -Current home readings: n/a; Denies hypotensive/hypertensive symptoms -Current treatment: Amlodipine 5 mg daily - Appropriate, Effective, Safe, Accessible Olmesartan-HCTZ 40-25 mg daily -Appropriate, Effective, Safe, Accessible -Medications previously tried: n/a  -Educated on BP goals and benefits of medications for prevention of heart attack, stroke and kidney damage; -Counseled to monitor BP at home periodically -Recommended to continue current medication  Hyperlipidemia: (LDL goal < 70) -Controlled - LDL 64 (03/2022) at goal -Hx aortic atherosclerosis -Current treatment: Simvastatin 20 mg daily HS - Appropriate, Effective, Safe, Accessible -Medications previously tried: n/a  -Educated on Cholesterol goals; Benefits of statin for ASCVD risk reduction; -Recommended to continue current medication  MCI -Not ideally controlled -Current treatment  Donepezil 5 mg daily HS - Appropriate, Effective, Safe, Accessible -Medications previously tried: n/a  -Recommended to continue current medication  Health Maintenance -Hx COPD, stable period off inhalers   Al Corpus, PharmD, BCACP Clinical Pharmacist [facility] at [facility] [phone]

## 2023-01-01 NOTE — Telephone Encounter (Cosign Needed)
Contacted patient; he has been rescheduled an in-office appointment with Al Corpus, PharmD today at 3:45.  Al Corpus, PharmD notified  Claudina Lick, [place] Clinical Pharmacy Assistant [phone]

## 2023-01-01 NOTE — Progress Notes (Unsigned)
Care Management & Coordination Services Pharmacy Note  01/01/2023 Name:  Anthony [name] MRN:  [id number] DOB:  [birth year]  Summary: F/U visit -DM: A1c 7.7% (11/2022), though query accuracy of A1c as CGM showed significantly worse control (GMI > 8.5%); pt received Mounjaro but has not started yet, he is not sure how to use it -Today: BG 400 (after breakfast), then took Tresiba 32, Novolog 18 units ~11am; glucose now 101 (3:30 pm)   Recommendations/Changes made from today's visit: -Scheduled OV to go over [place] and connect Dexcom to [facility] account  Follow up plan: -Pharmacist follow up OV 01/01/23 -PCP appt 01/15/23    Subjective: Anthony [name] is an 77 y.o. year old male who is a primary patient of [name], MD.  The care coordination team was consulted for assistance with disease management and care coordination needs.    Engaged with patient by telephone for follow up visit.  Recent office visits: 12/15/22 Dr Sharen Hones OV: f/u - continue amlodipine 5 mg. TIR 32%, 33% high, 34% very high. Trial Mounjaro 2.5 mg weekly. Start dropping novolog to 5 units/meal.  11/30/22 Dr Alphonsus Sias OV: edema - try support socks. Cut amlodipine in 1/2.  09/15/22 Dr Sharen Hones OV: f/u - start donepezil 5 mg. Encouraged Novolog 15 min prior to meals. Refer to CC for caregiver strain.  04/19/22 Dr Sharen Hones OV: annual - no changes. MMSE 24/30 - mild memory loss. F/u 3 months - memory.   Recent consult visits: 10/05/22 Dr Izora Ribas (Cardiology): CAD - start aspirin 81 mg. Increase amlodipine to 10 mg. Ordered PFT  09/13/22 Dr Elvera Lennox (Endocrine): A1c 7.2%; GMI 8.6% (TIR 35%); pt taking Ozempic 0.5 mg weekly; advised centralizing medical care and directed to PCP for DM control.   03/06/22 NP Nada Boozer (Cardiology): preop clearance, acceptable risk.   03/02/22 Dr Elvera Lennox (Endocrine): DM - A1c 7.3%; insulin compliance is main issue - not taking Novolog in AM or out to dinner; CGM - TIR 34%, GMI  8.6%; BG is swinging widely, cannot be cleared for knee surgery. No changes to prescribed regimen, just improve compliance.  Hospital visits: None in previous 6 months   Objective:  Lab Results  Component Value Date   CREATININE 0.98 09/15/2022   BUN 22 09/15/2022   GFR 76.10 09/15/2022   GFRNONAA >60 03/02/2020   GFRAA >60 03/02/2020   NA 139 09/15/2022   K 4.1 09/15/2022   CALCIUM 9.1 09/15/2022   CO2 28 09/15/2022   GLUCOSE 279 (H) 09/15/2022    Lab Results  Component Value Date/Time   HGBA1C 7.7 (A) 12/15/2022 11:39 AM   HGBA1C 7.2 (A) 09/13/2022 11:16 AM   HGBA1C 7.7 (H) 03/02/2020 10:26 AM   HGBA1C 7.8 (H) 01/12/2020 10:21 AM   FRUCTOSAMINE 284 09/15/2022 11:47 AM   FRUCTOSAMINE 314 (H) 04/14/2022 10:55 AM   GFR 76.10 09/15/2022 11:47 AM   GFR 81.27 04/14/2022 10:54 AM   MICROALBUR 6.6 (H) 04/14/2022 10:54 AM   MICROALBUR 6.3 (H) 06/28/2015 09:39 AM    Last diabetic Eye exam:  Lab Results  Component Value Date/Time   HMDIABEYEEXA No Retinopathy 11/21/2022 12:00 AM    Last diabetic Foot exam:  Lab Results  Component Value Date/Time   HMDIABFOOTEX done by podiatry - in chart 05/19/2019 12:00 AM     Lab Results  Component Value Date   CHOL 128 04/14/2022   HDL 40.80 04/14/2022   LDLCALC 64 04/14/2022   LDLDIRECT 94.0 10/05/2016   TRIG 115.0 04/14/2022  CHOLHDL 3 04/14/2022       Latest Ref Rng & Units 09/15/2022   11:47 AM 04/14/2022   10:54 AM 08/08/2021    3:54 PM  Hepatic Function  Total Protein 6.0 - 8.3 g/dL 6.3  6.1    Albumin 3.5 - 5.2 g/dL 4.1  4.0    AST 0 - 37 U/L 12  13    ALT 0 - 53 U/L 11  14  14    Alk Phosphatase 39 - 117 U/L 94  83    Total Bilirubin 0.2 - 1.2 mg/dL 0.6  1.0      Lab Results  Component Value Date/Time   TSH 1.97 09/15/2022 11:47 AM   TSH 2.05 04/14/2022 10:55 AM   FREET4 0.88 06/26/2014 09:05 AM       Latest Ref Rng & Units 02/24/2022    4:09 PM 03/02/2020   10:26 AM 01/05/2017   12:28 PM  CBC  WBC 3.8 -  10.8 Thousand/uL 7.0  8.5  5.7   Hemoglobin 13.2 - 17.1 g/dL 40.9  81.1  91.4   Hematocrit 38.5 - 50.0 % 40.7  42.8  41.3   Platelets 140 - 400 Thousand/uL 230  227  209.0     Lab Results  Component Value Date/Time   VITAMINB12 418 09/15/2022 11:47 AM   VITAMINB12 707 03/08/2021 03:08 PM    Clinical ASCVD: Yes  The ASCVD Risk score (Arnett DK, et al., 2019) failed to calculate for the following reasons:   The valid total cholesterol range is 130 to 320 mg/dL       7/82/9562   13:08 AM 09/20/2022    2:38 PM 09/15/2022   11:19 AM  Depression screen PHQ 2/9  Decreased Interest 0 0 0  Down, Depressed, Hopeless 0 0 0  PHQ - 2 Score 0 0 0  Altered sleeping   2  Tired, decreased energy   1  Change in appetite   0  Feeling bad or failure about yourself    0  Trouble concentrating   1  Moving slowly or fidgety/restless   0  Suicidal thoughts   0  PHQ-9 Score   4  Difficult doing work/chores   Somewhat difficult     Social History   Tobacco Use  Smoking Status Former   Packs/day: 0.30   Years: 50.00   Additional pack years: 0.00   Total pack years: 15.00   Types: Cigarettes   Quit date: 08/29/2011   Years since quitting: 11.3  Smokeless Tobacco Never   BP Readings from Last 3 Encounters:  12/15/22 128/74  11/30/22 136/66  10/05/22 (!) 152/82   Pulse Readings from Last 3 Encounters:  12/15/22 75  11/30/22 64  10/05/22 75   Wt Readings from Last 3 Encounters:  12/15/22 221 lb 4 oz (100.4 kg)  11/30/22 229 lb (103.9 kg)  10/05/22 219 lb (99.3 kg)   BMI Readings from Last 3 Encounters:  12/15/22 31.30 kg/m  11/30/22 32.39 kg/m  10/05/22 29.70 kg/m    No Known Allergies  Medications Reviewed Today     Reviewed by Kathyrn Sheriff, RPH (Pharmacist) on 12/27/22 at 1542  Med List Status: <None>   Medication Order Taking? Sig Documenting Provider Last Dose Status Informant  amLODipine (NORVASC) 5 MG tablet [id number] Yes Take 1 tablet (5 mg total) by mouth  daily. For blood pressure [name], MD Taking Active   aspirin EC 81 MG tablet [id number] Yes Take 1 tablet (81 mg  total) by mouth daily. Swallow whole. Christell Constant, MD Taking Active   b complex vitamins tablet [id number] Yes Take 1 tablet by mouth daily. [provider] Taking Active Self  Continuous Blood Gluc Sensor (DEXCOM G7 SENSOR) MISC [id number] Yes by Does not apply route. [provider] Taking Active   Dextrose, Diabetic Use, (GLUCOSE PO) [id number] Yes Take 1 tablet by mouth as needed. [provider] Taking Active Self  donepezil (ARICEPT) 5 MG tablet [id number] Yes Take 1 tablet (5 mg total) by mouth at bedtime. [name], MD Taking Active   GVOKE HYPOPEN 1-PACK 1 MG/0.2ML Ivory Broad [id number] Yes Inject under skin 0.2 mL as needed for hypoglycemia Carlus Pavlov, MD Taking Active   insulin aspart (NOVOLOG FLEXPEN) 100 UNIT/ML FlexPen [id number] Yes Inject 12-20 Units into the skin 3 (three) times daily before meals. Carlus Pavlov, MD Taking Active   insulin degludec (TRESIBA FLEXTOUCH) 200 UNIT/ML FlexTouch Pen [id number] Yes inject 32 units into THE SKIN DAILY Carlus Pavlov, MD Taking Active   Insulin Pen Needle (ADVOCATE INSULIN PEN NEEDLES) 31G X 5 MM MISC [id number] Yes Use 3x a day Carlus Pavlov, MD Taking Active Self  metFORMIN (GLUCOPHAGE-XR) 500 MG 24 hr tablet [id number] Yes TAKE ONE TABLET BY MOUTH EVERY Clayburn Pert, MD Taking Active   olmesartan-hydrochlorothiazide (BENICAR HCT) 40-25 MG tablet [id number] Yes Take 1 tablet by mouth daily. [name], MD Taking Active   [facility] ULTRA test strip [id number] Yes USE TO check blood glucose four times daily AS DIRECTED Carlus Pavlov, MD Taking Active   pantoprazole (PROTONIX) 40 MG tablet [id number] Yes Take 1 tablet (40 mg total) by mouth daily. [name], MD Taking Active   simvastatin (ZOCOR) 20 MG tablet [id number] Yes TAKE 1 TABLET BY MOUTH  EVERYDAY AT BEDTIME [name], MD Taking Active   tadalafil (CIALIS) 20 MG tablet [id number] Yes TAKE ONE-HALF TO 1 TABLET BY MOUTH EVERY OTHER DAY AS NEEDED [name], MD Taking Active   tirzepatide [facility]) 2.5 MG/0.5ML Pen [id number] No Inject 2.5 mg into the skin once a week. For diabetes  Patient not taking: Reported on 12/27/2022   [name], MD Not Taking Active             SDOH:  (Social Determinants of Health) assessments and interventions performed: Yes SDOH Interventions    Flowsheet Row Care Coordination from 09/20/2022 in [facility] Community Care Coordination Care Coordination from 09/07/2022 in [facility] Chronic Care Management from 02/15/2021 in [facility] Chronic Care Management from 01/20/2021 in [facility] at [facility] Chronic Care Management from 10/15/2020 in [facility] at [facility] Chronic Care Management from 08/25/2020 in [facility] at [place]  SDOH Interventions        Food Insecurity Interventions Intervention Not Indicated -- Intervention Not Indicated -- -- --  Housing Interventions Intervention Not Indicated Intervention Not Indicated Intervention Not Indicated -- -- --  Transportation Interventions Intervention Not Indicated -- Intervention Not Indicated -- -- --  Financial Strain Interventions -- Intervention Not Indicated -- Other (Comment)  [Apply for Ozempic assistance in coverage gap] Intervention Not Indicated  [Medications affordable] Intervention Not Indicated      SDOH Screenings   Food Insecurity: No Food Insecurity (09/20/2022)  Housing: Low Risk  (09/20/2022)  Transportation Needs: No Transportation Needs (09/20/2022)  Alcohol Screen: Low Risk  (01/12/2020)  Depression (PHQ2-9): Low Risk  (12/15/2022)  Financial Resource Strain: Low  Risk  (09/07/2022)  Physical Activity: Inactive (04/13/2022)   Stress: No Stress Concern Present (04/13/2022)  Tobacco Use: Medium Risk (12/15/2022)    Medication Assistance: None required.  Patient affirms current coverage meets needs.  Medication Access: Within the past 30 days, how often has patient missed a dose of medication? 0 Is a pillbox or other method used to improve adherence? Yes  Factors that may affect medication adherence? lack of understanding of disease management Are meds synced by current pharmacy? Yes  Are meds delivered by current pharmacy? Yes  Does patient experience delays in picking up medications due to transportation concerns? No   [facility] Reviewed: Is patient disadvantaged to use [facility]?: No  Current Rx insurance plan: Community education officer Name and location of Current pharmacy:  [facility] - [place], [place] - [address] [place] [address] Phone: [phone] Fax: [phone]  [id number] - [place], [place] - [address] [place] [address] Phone: [phone] Fax: [phone]  [facility] services reviewed with patient today?: Yes  30-day vials: Delivery 12/11/22 Simvastatin 20 mg Take 1 tablet every day at bedtime OneTouch Test strips - Use up to four times daily Tadalafil 20 mg - 1/2 to 1 tablet daily as needed- PRN use Olmesartan/HCTZ 40-25 mg Take 1 tablet by mouth every day Amlodipine 10 mg 1 tablet daily Pantoprazole 40 mg 1 tablet daily Novolog Flexpen Inject 12-20 units into the skin 3 times daily with meals Tresiba Flex 200 units- 32 units once daily in morning Donepezil 5mg - take 1 tablet bedtime  Metformin XR 500mg -take 1 tablet in morning   Compliance/Adherence/Medication fill history: Care Gaps: None  Star-Rating Drugs: Simvastatin - PDC 99% Olmesartan-HCTZ - PDC 100% Metformin - PDC 61%; only filling regularly since 08/2022     ASSESSMENT / PLAN  Diabetes (A1c goal <8%) -Not  ideally controlled - A1c 7.7% (11/2022), increase from 7.3% previously; A1c has not been accurate in comparison to CGM data which shows worse control; he stopped Ozempic for several months due to fear of side effects -Today: BG 400 (after breakfast), then took Tresiba 32, Novolog 18 11am; glucose now 101 -No longer following with endocrine; hx severe hypoglycemia and memory impairment -DM dx 2006, insulin since 2008 -Current meal patterns: eats 2 meals/day (late breakfast ~11 AM, early dinner ~ 5 PM) -Current exercise: tries to walk when he can -Current medications: Metformin 500 mg daily AM -  Appropriate, Effective, Safe, Accessible Novolog 08-11-17 u w meals - Appropriate, Query Effective Tresiba U200 32 units daily - Appropriate, Query Effective Mounjaro 2.5 mg weekly - not started yet Gvoke Hypopen PRN - not used Dexcom G7 -Appropriate, Effective, Safe, Accessible -Medications previously tried: Ozempic 1 mg (low appetite) -Educated on A1c and blood sugar goals; consequences of high BG on wound healing/surgery outcomes -Reviewed Mounjaro purpose/administration, pt would prefer to go over in person - scheduled OV 01/01/23; pt also wants to connect Dexcom to our office   Hypertension (BP goal <140/90) -Controlled - per OV readings -Current home readings: n/a; Denies hypotensive/hypertensive symptoms -Current treatment: Amlodipine 5 mg daily - Appropriate, Effective, Safe, Accessible Olmesartan-HCTZ 40-25 mg daily -Appropriate, Effective, Safe, Accessible -Medications previously tried: n/a  -Educated on BP goals and benefits of medications for prevention of heart attack, stroke and kidney damage; -Counseled to monitor BP at home periodically -Recommended to continue current medication  Hyperlipidemia: (LDL goal < 70) -Controlled - LDL 64 (03/2022) at goal -Hx  aortic atherosclerosis -Current treatment: Simvastatin 20 mg daily HS - Appropriate, Effective, Safe, Accessible -Medications  previously tried: n/a  -Educated on Cholesterol goals; Benefits of statin for ASCVD risk reduction; -Recommended to continue current medication  MCI -Not ideally controlled -Current treatment  Donepezil 5 mg daily HS - Appropriate, Effective, Safe, Accessible -Medications previously tried: n/a  -Recommended to continue current medication  Health Maintenance -Hx COPD, stable period off inhalers   Al Corpus, PharmD, BCACP Clinical Pharmacist  Primary Care at [facility] [phone]

## 2023-01-01 NOTE — Telephone Encounter (Signed)
Patient was meant to come to office in person, bring his Mounjaro and Dexcom. Will try to reschedule for this afternoon.

## 2023-01-01 NOTE — Patient Instructions (Signed)
Visit Information  Phone number for Pharmacist: [phone]  Thank you for meeting with me to discuss your medications! Below is a summary of what we talked about during the visit:   Start Mounjaro 2.5 mg once a week on Mondays. We are hoping that   Take Novolog with meals. DO NOT take a second full dose of Novolog within 4 hours of the first dose.  Blood sugar before meals  Novolog Dose  Less than 100  Do Not Take  100-200  12 units  200-300  15 units  More than 300  18 units     Al Corpus, PharmD, [employer] Clinical Pharmacist [place] [facility] [phone]

## 2023-01-01 NOTE — Telephone Encounter (Signed)
Patient missed the call at 11am, please call him back, states he had a low sugar morning and thought the call was a noon

## 2023-01-04 NOTE — Progress Notes (Signed)
Patient's son sent me a pill count for all medications patient is taking.   As of 12/28/2022 at 9:00 pm  Medication:   # Remaining: Pt takes: Simvastatin 20 mg   62  1 at nighttime Amlodipine 10 mg  58  1 a day Donepezil 5 mg  58  1 at nighttime Metformin XR 500 mg  45  1 in morning Olmesartan/HCTZ 40-25 mg 30  1 a day Pantoprazole 40 mg  59  1 a day Tadalafil 20 mg  79  0.5 - 1 every other day One Touch Test Strips  800 Novolog Flexpen   12 pens Tresiba Flex 200 units  7  Al Corpus, PharmD notified  Claudina Lick, [place] Clinical Pharmacy Assistant [phone]

## 2023-01-09 ENCOUNTER — Telehealth: Payer: Self-pay

## 2023-01-09 NOTE — Progress Notes (Signed)
Care Management & Coordination Services Pharmacy Team  Reason for Encounter: Appointment Reminder  Contacted patient to confirm telephone appointment with Al Corpus, PharmD on 01/12/2023 at 2:30.  Spoke with patient on 01/09/2023   Do you have any problems getting your medications? No  What is your top health concern you would like to discuss at your upcoming visit? Stated he does not have an appetite and has to force himself to eat.   Have you seen any other providers since your last visit with PCP? No  Star Rating Drugs:  Medication:                            Last Fill:         DS: Metformin 500 mg                   01/05/2023      30 Olmesartan-HCTZ 40-25 mg  01/05/2023      30 Simvastatin 20 mg                  01/05/2023      30   Care Gaps: Annual wellness visit in last year? Yes 04/13/2022   If Diabetic: Last eye exam / retinopathy screening: Up to date Last diabetic foot exam: Up to date  Al Corpus, PharmD notified  Claudina Lick, [place] Clinical Pharmacy Assistant [phone]

## 2023-01-12 ENCOUNTER — Encounter: Payer: Medicare HMO | Admitting: Pharmacist

## 2023-01-12 ENCOUNTER — Telehealth: Payer: Self-pay | Admitting: Pharmacist

## 2023-01-12 NOTE — Progress Notes (Unsigned)
Care Management & Coordination Services Pharmacy Note  01/12/2023 Name:  Anthony [name] MRN:  [id number] DOB:  [birth year]  Summary: F/U visit -DM: A1c 7.7% (11/2022), though query accuracy of A1c as CGM showed significantly worse control; pt received Mounjaro but has not started yet; pt also reports frequent hypoglycemia but per CGM it is not actually <70 -Pt does endorse insulin stacking: he will sometimes take a second full dose of Novolog (12 units) within 1-2 hrs of first Novolog 12 units if glucose does not come down; discussed dangers of insulin stacking and risk for hypoglycemia -Reviewed AGP report: 12/19/22 to 01/01/23. Sensor active: 99%  Time in range (70-180): 32% (goal > 70%)  High (180-250): 30%  Very high (>250): 38%  Low (< 70): 0% (goal < 4%)  GMI: 8.7%; Average glucose: 225   Recommendations/Changes made from today's visit: -Demonstrated how to use Mounjaro; pt successfully injected first dose himself today; discussed Anthony [name] will be increased to 5 mg after 4 weeks if tolerated -Provided Novolog sliding scale; discussed hopefully he can reduce Novolog once Mounjaro is titrated up; advised to avoid injecting Novolog within 4 hours of previous dose  Follow up plan: -Pharmacist follow up televisit scheduled for 10 days -PCP appt 01/15/23    Subjective: Anthony [name] is an 77 y.o. year old male who is a primary patient of Anthony Boyden, MD.  The care coordination team was consulted for assistance with disease management and care coordination needs.    Engaged with patient by telephone for follow up visit.  Recent office visits: 12/15/22 Dr Anthony [name] OV: f/u - continue amlodipine 5 mg. TIR 32%, 33% high, 34% very high. Trial Mounjaro 2.5 mg weekly. Start dropping novolog to 5 units/meal.  11/30/22 Dr Anthony [name] OV: edema - try support socks. Cut amlodipine in 1/2.  09/15/22 Dr Anthony [name] OV: f/u - start donepezil 5 mg. Encouraged Novolog 15 min prior to meals. Refer to CC  for caregiver strain.  04/19/22 Dr Anthony [name] OV: annual - no changes. MMSE 24/30 - mild memory loss. F/u 3 months - memory.   Recent consult visits: 10/05/22 Dr Anthony [name] (Cardiology): CAD - start aspirin 81 mg. Increase amlodipine to 10 mg. Ordered PFT  09/13/22 Dr Anthony [name] (Endocrine): A1c 7.2%; GMI 8.6% (TIR 35%); pt taking Ozempic 0.5 mg weekly; advised centralizing medical care and directed to PCP for DM control.   03/06/22 NP Anthony [name] (Cardiology): preop clearance, acceptable risk.   03/02/22 Dr Anthony [name] (Endocrine): DM - A1c 7.3%; insulin compliance is main issue - not taking Novolog in AM or out to dinner; CGM - TIR 34%, GMI 8.6%; BG is swinging widely, cannot be cleared for knee surgery. No changes to prescribed regimen, just improve compliance.  Hospital visits: None in previous 6 months   Objective:  Lab Results  Component Value Date   CREATININE 0.98 09/15/2022   BUN 22 09/15/2022   GFR 76.10 09/15/2022   GFRNONAA >60 03/02/2020   GFRAA >60 03/02/2020   NA 139 09/15/2022   K 4.1 09/15/2022   CALCIUM 9.1 09/15/2022   CO2 28 09/15/2022   GLUCOSE 279 (H) 09/15/2022    Lab Results  Component Value Date/Time   HGBA1C 7.7 (A) 12/15/2022 11:39 AM   HGBA1C 7.2 (A) 09/13/2022 11:16 AM   HGBA1C 7.7 (H) 03/02/2020 10:26 AM   HGBA1C 7.8 (H) 01/12/2020 10:21 AM   FRUCTOSAMINE 284 09/15/2022 11:47 AM   FRUCTOSAMINE 314 (H) 04/14/2022 10:55 AM   GFR 76.10 09/15/2022 11:47 AM  GFR 81.27 04/14/2022 10:54 AM   MICROALBUR 6.6 (H) 04/14/2022 10:54 AM   MICROALBUR 6.3 (H) 06/28/2015 09:39 AM    Last diabetic Eye exam:  Lab Results  Component Value Date/Time   HMDIABEYEEXA No Retinopathy 11/21/2022 12:00 AM    Last diabetic Foot exam:  Lab Results  Component Value Date/Time   HMDIABFOOTEX done by podiatry - in chart 05/19/2019 12:00 AM     Lab Results  Component Value Date   CHOL 128 04/14/2022   HDL 40.80 04/14/2022   LDLCALC 64 04/14/2022   LDLDIRECT 94.0  10/05/2016   TRIG 115.0 04/14/2022   CHOLHDL 3 04/14/2022       Latest Ref Rng & Units 09/15/2022   11:47 AM 04/14/2022   10:54 AM 08/08/2021    3:54 PM  Hepatic Function  Total Protein 6.0 - 8.3 g/dL 6.3  6.1    Albumin 3.5 - 5.2 g/dL 4.1  4.0    AST 0 - 37 U/L 12  13    ALT 0 - 53 U/L 11  14  14    Alk Phosphatase 39 - 117 U/L 94  83    Total Bilirubin 0.2 - 1.2 mg/dL 0.6  1.0      Lab Results  Component Value Date/Time   TSH 1.97 09/15/2022 11:47 AM   TSH 2.05 04/14/2022 10:55 AM   FREET4 0.88 06/26/2014 09:05 AM       Latest Ref Rng & Units 02/24/2022    4:09 PM 03/02/2020   10:26 AM 01/05/2017   12:28 PM  CBC  WBC 3.8 - 10.8 Thousand/uL 7.0  8.5  5.7   Hemoglobin 13.2 - 17.1 g/dL 16.1  09.6  04.5   Hematocrit 38.5 - 50.0 % 40.7  42.8  41.3   Platelets 140 - 400 Thousand/uL 230  227  209.0     Lab Results  Component Value Date/Time   VITAMINB12 418 09/15/2022 11:47 AM   VITAMINB12 707 03/08/2021 03:08 PM    Clinical ASCVD: Yes  The ASCVD Risk score (Arnett DK, et al., 2019) failed to calculate for the following reasons:   The valid total cholesterol range is 130 to 320 mg/dL       12/04/8117   14:78 AM 09/20/2022    2:38 PM 09/15/2022   11:19 AM  Depression screen PHQ 2/9  Decreased Interest 0 0 0  Down, Depressed, Hopeless 0 0 0  PHQ - 2 Score 0 0 0  Altered sleeping   2  Tired, decreased energy   1  Change in appetite   0  Feeling bad or failure about yourself    0  Trouble concentrating   1  Moving slowly or fidgety/restless   0  Suicidal thoughts   0  PHQ-9 Score   4  Difficult doing work/chores   Somewhat difficult     Social History   Tobacco Use  Smoking Status Former   Packs/day: 0.30   Years: 50.00   Additional pack years: 0.00   Total pack years: 15.00   Types: Cigarettes   Quit date: 08/29/2011   Years since quitting: 11.3  Smokeless Tobacco Never   BP Readings from Last 3 Encounters:  12/15/22 128/74  11/30/22 136/66  10/05/22  (!) 152/82   Pulse Readings from Last 3 Encounters:  12/15/22 75  11/30/22 64  10/05/22 75   Wt Readings from Last 3 Encounters:  12/15/22 221 lb 4 oz (100.4 kg)  11/30/22 229 lb (103.9 kg)  10/05/22 219 lb (99.3 kg)   BMI Readings from Last 3 Encounters:  12/15/22 31.30 kg/m  11/30/22 32.39 kg/m  10/05/22 29.70 kg/m    No Known Allergies  Medications Reviewed Today     Reviewed by Kathyrn Sheriff, [facility] (Pharmacist) on 01/01/23 at 1607  Med List Status: <None>   Medication Order Taking? Sig Documenting Provider Last Dose Status Informant  amLODipine (NORVASC) 5 MG tablet [id number] Yes Take 1 tablet (5 mg total) by mouth daily. For blood pressure Anthony Boyden, MD Taking Active   aspirin EC 81 MG tablet [id number] Yes Take 1 tablet (81 mg total) by mouth daily. Swallow whole. Christell Constant, MD Taking Active   b complex vitamins tablet [id number] Yes Take 1 tablet by mouth daily. [provider] Taking Active Self  Continuous Blood Gluc Sensor (DEXCOM G7 SENSOR) MISC [id number] Yes by Does not apply route. [provider] Taking Active   Dextrose, Diabetic Use, (GLUCOSE PO) [id number] Yes Take 1 tablet by mouth as needed. [provider] Taking Active Self  donepezil (ARICEPT) 5 MG tablet [id number] Yes Take 1 tablet (5 mg total) by mouth at bedtime. Anthony Boyden, MD Taking Active   GVOKE HYPOPEN 1-PACK 1 MG/0.2ML Ivory Broad [id number] Yes Inject under skin 0.2 mL as needed for hypoglycemia Carlus Pavlov, MD Taking Active   insulin aspart (NOVOLOG FLEXPEN) 100 UNIT/ML FlexPen [id number] Yes Inject 12-20 Units into the skin 3 (three) times daily before meals. Carlus Pavlov, MD Taking Active   insulin degludec (TRESIBA FLEXTOUCH) 200 UNIT/ML FlexTouch Pen [id number] Yes inject 32 units into THE SKIN DAILY Carlus Pavlov, MD Taking Active   Insulin Pen Needle (ADVOCATE INSULIN PEN NEEDLES) 31G X 5 MM MISC [id number] Yes Use 3x a day  Carlus Pavlov, MD Taking Active Self  metFORMIN (GLUCOPHAGE-XR) 500 MG 24 hr tablet [id number] Yes TAKE ONE TABLET BY MOUTH EVERY Clayburn Pert, MD Taking Active   olmesartan-hydrochlorothiazide (BENICAR HCT) 40-25 MG tablet [id number] Yes Take 1 tablet by mouth daily. Anthony Boyden, MD Taking Active   [facility] ULTRA test strip [id number] Yes USE TO check blood glucose four times daily AS DIRECTED Carlus Pavlov, MD Taking Active   pantoprazole (PROTONIX) 40 MG tablet [id number] Yes Take 1 tablet (40 mg total) by mouth daily. Anthony Boyden, MD Taking Active   simvastatin (ZOCOR) 20 MG tablet [id number] Yes TAKE 1 TABLET BY MOUTH EVERYDAY AT BEDTIME Anthony Boyden, MD Taking Active   tadalafil (CIALIS) 20 MG tablet [id number] Yes TAKE ONE-HALF TO 1 TABLET BY MOUTH EVERY OTHER DAY AS NEEDED Anthony Boyden, MD Taking Active   tirzepatide [facility]) 2.5 MG/0.5ML Pen [id number] Yes Inject 2.5 mg into the skin once a week. For diabetes Anthony Boyden, MD Taking Active             SDOH:  (Social Determinants of Health) assessments and interventions performed: Yes SDOH Interventions    Flowsheet Row Care Coordination from 09/20/2022 in [facility] Care Coordination Care Coordination from 09/07/2022 in [facility] Chronic Care Management from 02/15/2021 in [facility] Chronic Care Management from 01/20/2021 in [facility] Chronic Care Management from 10/15/2020 in [facility] Chronic Care Management from 08/25/2020 in [facility] [place]  SDOH Interventions        Food Insecurity Interventions Intervention Not Indicated -- Intervention Not Indicated -- -- --  Housing Interventions Intervention Not Indicated Intervention  Not Indicated Intervention Not Indicated -- -- --  Transportation Interventions Intervention Not  Indicated -- Intervention Not Indicated -- -- --  Financial Strain Interventions -- Intervention Not Indicated -- Other (Comment)  [Apply for Ozempic assistance in coverage gap] Intervention Not Indicated  [Medications affordable] Intervention Not Indicated      SDOH Screenings   Food Insecurity: No Food Insecurity (09/20/2022)  Housing: Low Risk  (09/20/2022)  Transportation Needs: No Transportation Needs (09/20/2022)  Alcohol Screen: Low Risk  (01/12/2020)  Depression (PHQ2-9): Low Risk  (12/15/2022)  Financial Resource Strain: Low Risk  (09/07/2022)  Physical Activity: Inactive (04/13/2022)  Stress: No Stress Concern Present (04/13/2022)  Tobacco Use: Medium Risk (12/15/2022)    Medication Assistance: None required.  Patient affirms current coverage meets needs.  Medication Access: Within the past 30 days, how often has patient missed a dose of medication? 0 Is a pillbox or other method used to improve adherence? Yes  Factors that may affect medication adherence? lack of understanding of disease management Are meds synced by current pharmacy? Yes  Are meds delivered by current pharmacy? Yes  Does patient experience delays in picking up medications due to transportation concerns? No  Name and location of Current pharmacy:  [facility] - [place], [place] - [address] [place] [address] Phone: [phone] Fax: [phone]  [facility] services reviewed with patient today?: Yes  30-day vials: Delivery 12/11/22 Simvastatin 20 mg Take 1 tablet every day at bedtime OneTouch Test strips - Use up to four times daily Tadalafil 20 mg - 1/2 to 1 tablet daily as needed- PRN use Olmesartan/HCTZ 40-25 mg Take 1 tablet by mouth every day Amlodipine 10 mg 1 tablet daily Pantoprazole 40 mg 1 tablet daily Novolog Flexpen Inject 12-20 units into the skin 3 times daily with meals Tresiba Flex 200 units- 32 units once daily in  morning Donepezil 5mg - take 1 tablet bedtime  Metformin XR 500mg -take 1 tablet in morning   Compliance/Adherence/Medication fill history: Care Gaps: None  Star-Rating Drugs: Simvastatin - PDC 99% Olmesartan-HCTZ - PDC 100% Metformin - PDC 61%; only filling regularly since 08/2022     ASSESSMENT / PLAN  Diabetes (A1c goal <8%) -Not ideally controlled - A1c 7.7% (11/2022), increase from 7.3% previously; A1c has not been accurate in comparison to CGM data which shows worse control; he stopped Ozempic for several months due to appetite suppression; he was recently prescribed Mounjaro but has not started it yet -No longer following with endocrine; hx severe hypoglycemia and memory impairment -DM dx 2006, insulin since 2008 -Current meal patterns: eats 2 meals/day (late breakfast ~11 AM, early dinner ~ 5 PM) -Current exercise: tries to walk when he can Reviewed AGP report: 12/30/22 to 01/12/23. Sensor active: 99%  Time in range (70-180): 38% (goal > 70%)  High (180-250): 32%  Very high (>250): 30%  Low (< 70): 0% (goal < 4%)  GMI: 8.4%; Average glucose: 211  Previous AGP report: 12/19/22 to 01/01/23. Sensor active: 99%  Time in range (70-180): 32% (goal > 70%)  High (180-250): 30%  Very high (>250): 38%  Low (< 70): 0% (goal < 4%)  GMI: 8.7%; Average glucose: 225  -Current medications: Metformin 500 mg daily AM -  Appropriate, Effective, Safe, Accessible Novolog 08-11-17 u w meals - Appropriate, Query Effective Tresiba U200 32 units daily - Appropriate, Query Effective Mounjaro 2.5 mg weekly - started today Gvoke Hypopen PRN - not used Dexcom G7 -Appropriate,  Effective, Safe, Accessible -Medications previously tried: Ozempic 1 mg (low appetite) -Educated on A1c and blood sugar goals; consequences of high BG on wound healing/surgery outcomes -Demonstrated how to inject Mounjaro; pt successfully injected first dose himself today; Discussed Mounjaro will be titrated monthly and  hopefully he can reduce or come off Novolog; need to be cautious with titrating Mounjaro in general with hx of appetite suppression with Ozempic -Recommend to continue current medication; provided sliding scale for Novolog  Hypertension (BP goal <140/90) -Controlled - per OV readings -Current home readings: n/a; Denies hypotensive/hypertensive symptoms -Current treatment: Amlodipine 5 mg daily - Appropriate, Effective, Safe, Accessible Olmesartan-HCTZ 40-25 mg daily -Appropriate, Effective, Safe, Accessible -Medications previously tried: n/a  -Educated on BP goals and benefits of medications for prevention of heart attack, stroke and kidney damage; -Counseled to monitor BP at home periodically -Recommended to continue current medication  Hyperlipidemia: (LDL goal < 70) -Controlled - LDL 64 (03/2022) at goal -Hx aortic atherosclerosis -Current treatment: Simvastatin 20 mg daily HS - Appropriate, Effective, Safe, Accessible -Medications previously tried: n/a  -Educated on Cholesterol goals; Benefits of statin for ASCVD risk reduction; -Recommended to continue current medication  MCI -Not ideally controlled -Current treatment  Donepezil 5 mg daily HS - Appropriate, Effective, Safe, Accessible -Medications previously tried: n/a  -Recommended to continue current medication  Health Maintenance -Hx COPD, stable period off inhalers   Al Corpus, PharmD, BCACP Clinical Pharmacist [facility] [phone]

## 2023-01-12 NOTE — Telephone Encounter (Signed)
Noted! Thank you

## 2023-01-12 NOTE — Telephone Encounter (Signed)
Care Management & Coordination Services Outreach Note  01/12/2023 Name: Anthony [name] MRN: [id number] DOB: [birth year]  Referred by: [name], MD  Patient had a phone appointment scheduled with clinical pharmacist today.  An unsuccessful telephone outreach was attempted today. The patient was referred to the pharmacist for assistance with medications, care management and care coordination.   Patient will NOT be penalized in any way for missing a Care Management & Coordination Services appointment. The no-show fee does not apply.  If possible, a message was left to return call to: [phone] or to [facility].  I did review his Dexcom report in preparation for the appt (see below), it did show slight improvement after starting Mounjaro 0.25 mg 2 weeks ago. It is not clear if patient has continued Mounjaro after 1 dose, he did report to pharmacy assistant earlier this week that he does not have an appetite and has to force himself to eat. He had similar issue with Ozempic previously.   Patient has PCP upcoming on 5/20. If he is unable to tolerate [facility], it would be reasonable to try an SGLT2-inhibitor. Forwarding to PCP as FYI.  Reviewed AGP report: 12/30/22 to 01/12/23. Sensor active: 99%  Time in range (70-180): 38% (improved from 32%)  High (180-250): 32%  Very high (>250): 30% (improved from 38%)  Low (< 70): 0% (goal < 4%)  GMI: 8.4% (improved from 8.7%); Average glucose: 211 (improved from 225)   Al Corpus, PharmD, [facility] Clinical Pharmacist [facility] at [facility] [phone]

## 2023-01-15 ENCOUNTER — Ambulatory Visit: Payer: Medicare HMO | Admitting: Family Medicine

## 2023-01-16 ENCOUNTER — Encounter: Payer: Self-pay | Admitting: Family Medicine

## 2023-01-17 ENCOUNTER — Ambulatory Visit: Payer: Self-pay

## 2023-01-17 NOTE — Patient Outreach (Signed)
  Care Coordination   Follow Up Visit Note   01/17/2023 Name: Anthony [name] MRN: [id number] DOB: December 06, 1945  Anthony [name] is a 77 y.o. year old male who sees Anthony Boyden, MD for primary care. I spoke with  Anthony [name] by phone today.  What matters to the patients health and wellness today?  Patient reports having follow up with primary care provider on 12/15/22.  Patient states swelling in lower extremities have decrease some due to amlodipine adjustment.  Per chart review patients Hgb A1c 7.7 on 12/15/22.  Patient reports ongoing frequent low blood sugars since being on mounjaro.  He reports almost daily low blood sugars of 80's or below.  Patient states he still finds it difficult to have appetite on mounjaro.  He reports noticing low blood sugars at anytime but more at night.  He reports recent low blood sugar at HS of 40.  Patient states he is treating low blood sugar with 3-4 glucose tablets or glucose shot.   Patient states he is not taking as much Novolog as he once was. He states if his coverage request 12 units he may take 8-9 units.  Patient states he does not have compression hose.     Goals Addressed             This Visit's Progress    Patient Stated: Controlling my blood sugars       Interventions Today    Flowsheet Row Most Recent Value  Chronic Disease   Chronic disease during today's visit Diabetes  General Interventions   General Interventions Discussed/Reviewed General Interventions Reviewed, Doctor Visits  [evaluation of current treatment plan for diabetes and patients adherence to plan as established by provider. Assessed blood sugar readings.]  Doctor Visits Discussed/Reviewed Doctor Visits Reviewed  Anthony [name] upcoming provider visits. Message sent to PCP and practice pharmacist regarding patients ongoing frequent low blood sugar.]  Education Interventions   Education Provided Provided Education  Provided Verbal Education On Other  [Discussed  hypoglycemic/ hyperglycemic treatment. Advised to wear compression stocking and elevate legs when sitting. Provided patient contact information for Anthony [name] supply for compression stocking purchase.]  Nutrition Interventions   Nutrition Discussed/Reviewed Nutrition Reviewed  [discussed importance of patient eating regular meals and not skipping.]  Pharmacy Interventions   Pharmacy Dicussed/Reviewed Pharmacy Topics Reviewed  [medications reviewed and compliance discussed. Confirmed patient taking Mounjaro and slding scale insulin as prescribed.]                SDOH assessments and interventions completed:  No     Care Coordination Interventions:  Yes, provided   Follow up plan: Follow up call scheduled for 02/14/23    Encounter Outcome:  Pt. Visit Completed   Anthony Ina RN,BSN,CCM [name] Care Coordination [phone] direct line

## 2023-01-17 NOTE — Patient Instructions (Signed)
Visit Information  Thank you for taking time to visit with me today. Please don't hesitate to contact me if I can be of assistance to you.   Following are the goals we discussed today:  Take medications as prescribed and follow sliding scale as recommended.  Make sure to eat regularly to avoid frequent low blood sugars. Continue to monitor blood sugar 3-4 times per day  You can purchase compression hose at medical supply company [facility] medical supply Keep follow up appointments with provider.    Our next appointment is by telephone on 02/14/23 at 2:30 pm  Please call the care guide team at [phone] if you need to cancel or reschedule your appointment.   If you are experiencing a Mental Health or Behavioral Health Crisis or need someone to talk to, please call the Suicide and Crisis Lifeline: 988 call 1-800-273-TALK (toll free, 24 hour hotline)  The patient verbalized understanding of instructions, educational materials, and care plan provided today and agreed to receive a mailed copy of patient instructions, educational materials, and care plan.   George Ina RN,BSN,CCM [facility] Care Coordination [phone] direct line

## 2023-01-18 ENCOUNTER — Ambulatory Visit: Payer: Medicare HMO | Admitting: Pharmacist

## 2023-01-18 NOTE — Telephone Encounter (Signed)
Spoke with pt relaying Dr. Timoteo Expose message. Pt verbalizes understanding, apologized for missing appt with Dr. Reece Agar and r/s for 01/23/23 at 12:00. Pt took Mounjaro dose yesterday but will hold future doses.   Fwd msg to Warner so she can r/s appt with her.

## 2023-01-18 NOTE — Telephone Encounter (Signed)
Patient missed appt with Mardella Layman last week and appt with me on Monday.  Per RNCM who spoke with patient yesterday: "I spoke with Anthony [name] this afternoon and he stated he is having almost daily lows since taking Mounjaro.  He reports having a blood sugar of 40 during the night within the past week. He said he fell to the floor but was able to get up and treat.   The low's he reports ranges from  40- 80's.  As with the Ozempic he states he doesn't have an appetite. He also stated he is not using as much Novolog. He reports if the sliding scale states he should receive 12 units he will take 8-9 units.   I wanted to update you both with this information.  Please let me know if I can be of further assistance.  George Ina RN,BSN,CCM  [facility] Care Coordination "  Please have him ASAP reschedule OV with me and with Mardella Layman to review insulin and mounjaro use.   In interim, stop Mounjaro at this time.  Call us right away if any further lows <70.

## 2023-01-18 NOTE — Progress Notes (Signed)
Care Management & Coordination Services Pharmacy Note  01/18/2023 Name:  Anthony [name] MRN:  [id number] DOB:  11-20-1945  Summary: F/U visit -DM: A1c 7.7% (11/2022), though query accuracy of A1c as CGM showed significantly worse control; pt started [facility] a few weeks ago and reports poor appetite; pt also reports frequent hypoglycemia (daily), however per CGM there are only 4 documented episodes of hypoglycemia <70 on 2 separate days; per review of alarms, his Dexcom alarms for "Low" when glucose is <90 -Reviewed AGP report: 01/05/23 to 01/18/23. Sensor active: 99%  Time in range (70-180): 37% (improved from 32%)  High (180-250): 30%  Very high (>250): 32% (improved from 38%)  Low (< 70): 1% (goal < 4%)  GMI: 8.4% (improved from 8.7%); Average glucose: 213 (improve from 225)   Recommendations/Changes made from today's visit: -Advised to hold Mounjaro until PCP follow up -Advised to use Novolog sliding scale (previously provided) with each meal Blood sugar before meals  Novolog Dose  Less than 100  Do Not Take  100-200  12 units  200-300  15 units  More than 300  18 units   Follow up plan: -CC RN 02/14/23 -Pharmacist follow up televisit scheduled for 2 weeks -PCP appt 01/23/23    Subjective: Anthony [name] is an 77 y.o. year old male who is a primary patient of Anthony Boyden, MD.  The care coordination team was consulted for assistance with disease management and care coordination needs.    Engaged with patient by telephone for follow up visit.  Recent office visits: 12/15/22 Dr Sharen Hones OV: f/u - continue amlodipine 5 mg. TIR 32%, 33% high, 34% very high. Trial Mounjaro 2.5 mg weekly. Start dropping novolog to 5 units/meal.  11/30/22 Dr Alphonsus Sias OV: edema - try support socks. Cut amlodipine in 1/2.  09/15/22 Dr Sharen Hones OV: f/u - start donepezil 5 mg. Encouraged Novolog 15 min prior to meals. Refer to CC for caregiver strain.  04/19/22 Dr Sharen Hones OV: annual - no changes.  MMSE 24/30 - mild memory loss. F/u 3 months - memory.   Recent consult visits: 10/05/22 Dr Izora Ribas (Cardiology): CAD - start aspirin 81 mg. Increase amlodipine to 10 mg. Ordered PFT  09/13/22 Dr Elvera Lennox (Endocrine): A1c 7.2%; GMI 8.6% (TIR 35%); pt taking Ozempic 0.5 mg weekly; advised centralizing medical care and directed to PCP for DM control.   03/06/22 NP Nada Boozer (Cardiology): preop clearance, acceptable risk.   03/02/22 Dr Elvera Lennox (Endocrine): DM - A1c 7.3%; insulin compliance is main issue - not taking Novolog in AM or out to dinner; CGM - TIR 34%, GMI 8.6%; BG is swinging widely, cannot be cleared for knee surgery. No changes to prescribed regimen, just improve compliance.  Hospital visits: None in previous 6 months   Objective:  Lab Results  Component Value Date   CREATININE 0.98 09/15/2022   BUN 22 09/15/2022   GFR 76.10 09/15/2022   GFRNONAA >60 03/02/2020   GFRAA >60 03/02/2020   NA 139 09/15/2022   K 4.1 09/15/2022   CALCIUM 9.1 09/15/2022   CO2 28 09/15/2022   GLUCOSE 279 (H) 09/15/2022    Lab Results  Component Value Date/Time   HGBA1C 7.7 (A) 12/15/2022 11:39 AM   HGBA1C 7.2 (A) 09/13/2022 11:16 AM   HGBA1C 7.7 (H) 03/02/2020 10:26 AM   HGBA1C 7.8 (H) 01/12/2020 10:21 AM   FRUCTOSAMINE 284 09/15/2022 11:47 AM   FRUCTOSAMINE 314 (H) 04/14/2022 10:55 AM   GFR 76.10 09/15/2022 11:47 AM   GFR 81.27  04/14/2022 10:54 AM   MICROALBUR 6.6 (H) 04/14/2022 10:54 AM   MICROALBUR 6.3 (H) 06/28/2015 09:39 AM    Last diabetic Eye exam:  Lab Results  Component Value Date/Time   HMDIABEYEEXA No Retinopathy 11/21/2022 12:00 AM    Last diabetic Foot exam:  Lab Results  Component Value Date/Time   HMDIABFOOTEX done by podiatry - in chart 05/19/2019 12:00 AM     Lab Results  Component Value Date   CHOL 128 04/14/2022   HDL 40.80 04/14/2022   LDLCALC 64 04/14/2022   LDLDIRECT 94.0 10/05/2016   TRIG 115.0 04/14/2022   CHOLHDL 3 04/14/2022       Latest  Ref Rng & Units 09/15/2022   11:47 AM 04/14/2022   10:54 AM 08/08/2021    3:54 PM  Hepatic Function  Total Protein 6.0 - 8.3 g/dL 6.3  6.1    Albumin 3.5 - 5.2 g/dL 4.1  4.0    AST 0 - 37 U/L 12  13    ALT 0 - 53 U/L 11  14  14    Alk Phosphatase 39 - 117 U/L 94  83    Total Bilirubin 0.2 - 1.2 mg/dL 0.6  1.0      Lab Results  Component Value Date/Time   TSH 1.97 09/15/2022 11:47 AM   TSH 2.05 04/14/2022 10:55 AM   FREET4 0.88 06/26/2014 09:05 AM       Latest Ref Rng & Units 02/24/2022    4:09 PM 03/02/2020   10:26 AM 01/05/2017   12:28 PM  CBC  WBC 3.8 - 10.8 Thousand/uL 7.0  8.5  5.7   Hemoglobin 13.2 - 17.1 g/dL 16.1  09.6  04.5   Hematocrit 38.5 - 50.0 % 40.7  42.8  41.3   Platelets 140 - 400 Thousand/uL 230  227  209.0     Lab Results  Component Value Date/Time   VITAMINB12 418 09/15/2022 11:47 AM   VITAMINB12 707 03/08/2021 03:08 PM    Clinical ASCVD: Yes  The ASCVD Risk score (Arnett DK, et al., 2019) failed to calculate for the following reasons:   The valid total cholesterol range is 130 to 320 mg/dL       12/04/8117   14:78 AM 09/20/2022    2:38 PM 09/15/2022   11:19 AM  Depression screen PHQ 2/9  Decreased Interest 0 0 0  Down, Depressed, Hopeless 0 0 0  PHQ - 2 Score 0 0 0  Altered sleeping   2  Tired, decreased energy   1  Change in appetite   0  Feeling bad or failure about yourself    0  Trouble concentrating   1  Moving slowly or fidgety/restless   0  Suicidal thoughts   0  PHQ-9 Score   4  Difficult doing work/chores   Somewhat difficult     Social History   Tobacco Use  Smoking Status Former   Packs/day: 0.30   Years: 50.00   Additional pack years: 0.00   Total pack years: 15.00   Types: Cigarettes   Quit date: 08/29/2011   Years since quitting: 11.3  Smokeless Tobacco Never   BP Readings from Last 3 Encounters:  12/15/22 128/74  11/30/22 136/66  10/05/22 (!) 152/82   Pulse Readings from Last 3 Encounters:  12/15/22 75  11/30/22  64  10/05/22 75   Wt Readings from Last 3 Encounters:  12/15/22 221 lb 4 oz (100.4 kg)  11/30/22 229 lb (103.9 kg)  10/05/22 219  lb (99.3 kg)   BMI Readings from Last 3 Encounters:  12/15/22 31.30 kg/m  11/30/22 32.39 kg/m  10/05/22 29.70 kg/m    No Known Allergies  Medications Reviewed Today     Reviewed by Kathyrn Sheriff, [facility] (Pharmacist) on 01/18/23 at 1715  Med List Status: <None>   Medication Order Taking? Sig Documenting Provider Last Dose Status Informant  amLODipine (NORVASC) 5 MG tablet [id number] Yes Take 1 tablet (5 mg total) by mouth daily. For blood pressure Anthony Boyden, MD Taking Active   aspirin EC 81 MG tablet [id number] Yes Take 1 tablet (81 mg total) by mouth daily. Swallow whole. Christell Constant, MD Taking Active   b complex vitamins tablet [id number] Yes Take 1 tablet by mouth daily. [provider] Taking Active Self  Continuous Blood Gluc Sensor (DEXCOM G7 SENSOR) MISC [id number] Yes by Does not apply route. [provider] Taking Active   Dextrose, Diabetic Use, (GLUCOSE PO) [id number] Yes Take 1 tablet by mouth as needed. [provider] Taking Active Self  donepezil (ARICEPT) 5 MG tablet [id number] Yes Take 1 tablet (5 mg total) by mouth at bedtime. Anthony Boyden, MD Taking Active   GVOKE HYPOPEN 1-PACK 1 MG/0.2ML Ivory Broad [id number] Yes Inject under skin 0.2 mL as needed for hypoglycemia Carlus Pavlov, MD Taking Active   insulin aspart (NOVOLOG FLEXPEN) 100 UNIT/ML FlexPen [id number] Yes Inject 12-20 Units into the skin 3 (three) times daily before meals. Carlus Pavlov, MD Taking Active   insulin degludec (TRESIBA FLEXTOUCH) 200 UNIT/ML FlexTouch Pen [id number] Yes inject 32 units into THE SKIN DAILY Carlus Pavlov, MD Taking Active   Insulin Pen Needle (ADVOCATE INSULIN PEN NEEDLES) 31G X 5 MM MISC [id number] Yes Use 3x a day Carlus Pavlov, MD Taking Active Self  metFORMIN (GLUCOPHAGE-XR) 500 MG 24 hr  tablet [id number] Yes TAKE ONE TABLET BY MOUTH EVERY Clayburn Pert, MD Taking Active   olmesartan-hydrochlorothiazide (BENICAR HCT) 40-25 MG tablet [id number] Yes Take 1 tablet by mouth daily. Anthony Boyden, MD Taking Active   [facility] ULTRA test strip [id number] Yes USE TO check blood glucose four times daily AS DIRECTED Carlus Pavlov, MD Taking Active   pantoprazole (PROTONIX) 40 MG tablet [id number] Yes Take 1 tablet (40 mg total) by mouth daily. Anthony Boyden, MD Taking Active   simvastatin (ZOCOR) 20 MG tablet [id number] Yes TAKE 1 TABLET BY MOUTH EVERYDAY AT BEDTIME Anthony Boyden, MD Taking Active   tadalafil (CIALIS) 20 MG tablet [id number] Yes TAKE ONE-HALF TO 1 TABLET BY MOUTH EVERY OTHER DAY AS NEEDED Anthony Boyden, MD Taking Active   tirzepatide [facility]) 2.5 MG/0.5ML Pen [id number] No Inject 2.5 mg into the skin once a week. For diabetes  Patient not taking: Reported on 01/18/2023   Anthony Boyden, MD Not Taking Active            Med Note Robyne Askew Jan 18, 2023  5:15 PM) Last dose 01/16/23            SDOH:  (Social Determinants of Health) assessments and interventions performed: Yes SDOH Interventions    Flowsheet Row Care Coordination from 09/20/2022 in [facility] Care Coordination Care Coordination from 09/07/2022 in [facility] Chronic Care Management from 02/15/2021 in [facility] at [facility] Chronic Care Management from 01/20/2021 in [facility] at [facility] Chronic Care Management from 10/15/2020 in [facility] at [facility] Chronic Care Management from 08/25/2020 in [facility]  Nature conservation officer at [employer]  SDOH Interventions        Food Insecurity Interventions Intervention Not Indicated -- Intervention Not Indicated -- -- --  Housing Interventions Intervention Not Indicated Intervention Not Indicated Intervention Not  Indicated -- -- --  Transportation Interventions Intervention Not Indicated -- Intervention Not Indicated -- -- --  Financial Strain Interventions -- Intervention Not Indicated -- Other (Comment)  [Apply for Ozempic assistance in coverage gap] Intervention Not Indicated  [Medications affordable] Intervention Not Indicated      SDOH Screenings   Food Insecurity: No Food Insecurity (09/20/2022)  Housing: Low Risk  (09/20/2022)  Transportation Needs: No Transportation Needs (09/20/2022)  Alcohol Screen: Low Risk  (01/12/2020)  Depression (PHQ2-9): Low Risk  (12/15/2022)  Financial Resource Strain: Low Risk  (09/07/2022)  Physical Activity: Inactive (04/13/2022)  Stress: No Stress Concern Present (04/13/2022)  Tobacco Use: Medium Risk (12/15/2022)    Medication Assistance: None required.  Patient affirms current coverage meets needs.  Medication Access: Within the past 30 days, how often has patient missed a dose of medication? 0 Is a pillbox or other method used to improve adherence? Yes  Factors that may affect medication adherence? lack of understanding of disease management Are meds synced by current pharmacy? Yes  Are meds delivered by current pharmacy? Yes  Does patient experience delays in picking up medications due to transportation concerns? No  Name and location of Current pharmacy:  [facility] - [place], [place] - [address] [place] [address] Phone: [phone] Fax: [phone]  [facility] services reviewed with patient today?: Yes  30-day vials: Delivery 12/11/22 Simvastatin 20 mg Take 1 tablet every day at bedtime OneTouch Test strips - Use up to four times daily Tadalafil 20 mg - 1/2 to 1 tablet daily as needed- PRN use Olmesartan/HCTZ 40-25 mg Take 1 tablet by mouth every day Amlodipine 10 mg 1 tablet daily Pantoprazole 40 mg 1 tablet daily Novolog Flexpen Inject 12-20 units into the skin 3 times daily  with meals Tresiba Flex 200 units- 32 units once daily in morning Donepezil 5mg - take 1 tablet bedtime  Metformin XR 500mg -take 1 tablet in morning   Compliance/Adherence/Medication fill history: Care Gaps: None  Star-Rating Drugs: Simvastatin - PDC 99% Olmesartan-HCTZ - PDC 100% Metformin - PDC 61%; only filling regularly since 08/2022     ASSESSMENT / PLAN  Diabetes (A1c goal <8%) -Not ideally controlled - A1c 7.7% (11/2022), increase from 7.3% previously; A1c has not been accurate in comparison to CGM data which shows worse control; he started [facility] a few weeks ago and reports several low glucose readings since then; he feels like he has to force himself to eat, similar to issue with Ozempic previuosly -No longer following with endocrine; hx severe hypoglycemia and memory impairment -DM dx 2006, insulin since 2008 -Current meal patterns: eats 2 meals/day (late breakfast ~11 AM, early dinner ~ 5 PM) -Current exercise: tries to walk when he can Reviewed AGP report: 01/05/23 to 01/18/23. Sensor active: 99%  Time in range (70-180): 37% (goal > 70%)  High (180-250): 30%  Very high (>250): 32%  Low (< 70): 1% (goal < 4%)  GMI: 8.4%; Average glucose: 213   Previous AGP report: 12/19/22 to 01/01/23. Sensor active: 99%  Time in range (70-180): 32% (goal > 70%)  High (180-250): 30%  Very high (>250): 38%  Low (< 70): 0% (goal < 4%)  GMI: 8.7%; Average glucose: 225  -Current  medications: Metformin 500 mg daily AM -  Appropriate, Effective, Safe, Accessible Novolog 08-11-17 u w meals - Appropriate, Query Effective Tresiba U200 32 units daily - Appropriate, Query Effective Mounjaro 2.5 mg weekly - last dose 5/21 Gvoke Hypopen PRN - not used Dexcom G7 -Appropriate, Effective, Safe, Accessible -Medications previously tried: Ozempic 1 mg (low appetite) -Educated on A1c and blood sugar goals; consequences of high BG on wound healing/surgery outcomes -advised to hold Mounjaro until PCP  appt; use Novolog per sliding scale (provided previously) before each meal  Hypertension (BP goal <140/90) -Controlled - per OV readings -Current home readings: n/a; Denies hypotensive/hypertensive symptoms -Current treatment: Amlodipine 5 mg daily - Appropriate, Effective, Safe, Accessible Olmesartan-HCTZ 40-25 mg daily -Appropriate, Effective, Safe, Accessible -Medications previously tried: n/a  -Educated on BP goals and benefits of medications for prevention of heart attack, stroke and kidney damage; -Counseled to monitor BP at home periodically -Recommended to continue current medication  Hyperlipidemia: (LDL goal < 70) -Controlled - LDL 64 (03/2022) at goal -Hx aortic atherosclerosis -Current treatment: Simvastatin 20 mg daily HS - Appropriate, Effective, Safe, Accessible -Medications previously tried: n/a  -Educated on Cholesterol goals; Benefits of statin for ASCVD risk reduction; -Recommended to continue current medication  MCI -Not ideally controlled -Current treatment  Donepezil 5 mg daily HS - Appropriate, Effective, Safe, Accessible -Medications previously tried: n/a  -Recommended to continue current medication  Health Maintenance -Hx COPD, stable period off inhalers   Al Corpus, PharmD, BCACP Clinical Pharmacist [facility] [phone]

## 2023-01-18 NOTE — Telephone Encounter (Signed)
Spoke with patient, he started [facility] a few weeks ago and reports poor appetite; pt also reports frequent hypoglycemia (daily), however per CGM report (below) there are only 4 documented episodes of hypoglycemia <70 on 2 separate days (5/15 and 5/19); per review of alarm events, his Dexcom alarms for "Low" when glucose is <90  Did advise patient to hold Mounjaro until PCP appt.  Also advised to use Novolog per simplified sliding scale (previously provided) with each meal Blood sugar before meals  Novolog Dose  Less than 100  Do Not Take  100-200  12 units  200-300  15 units  More than 300  18 units    Reviewed AGP report: 01/05/23 to 01/18/23. Sensor active: 99%             Time in range (70-180): 37% (improved from 32%)             High (180-250): 30%             Very high (>250): 32% (improved from 38%)             Low (< 70): 1% (goal < 4%)             GMI: 8.4% (improved from 8.7%); Average glucose: 213 (improve from 225)

## 2023-01-23 ENCOUNTER — Encounter: Payer: Self-pay | Admitting: Family Medicine

## 2023-01-23 ENCOUNTER — Ambulatory Visit (INDEPENDENT_AMBULATORY_CARE_PROVIDER_SITE_OTHER): Payer: Medicare HMO | Admitting: Family Medicine

## 2023-01-23 VITALS — BP 130/70 | HR 68 | Temp 97.3°F | Ht 70.5 in | Wt 214.1 lb

## 2023-01-23 DIAGNOSIS — E11649 Type 2 diabetes mellitus with hypoglycemia without coma: Secondary | ICD-10-CM | POA: Diagnosis not present

## 2023-01-23 DIAGNOSIS — I10 Essential (primary) hypertension: Secondary | ICD-10-CM

## 2023-01-23 DIAGNOSIS — G3184 Mild cognitive impairment, so stated: Secondary | ICD-10-CM | POA: Diagnosis not present

## 2023-01-23 DIAGNOSIS — Z794 Long term (current) use of insulin: Secondary | ICD-10-CM | POA: Diagnosis not present

## 2023-01-23 DIAGNOSIS — R600 Localized edema: Secondary | ICD-10-CM

## 2023-01-23 DIAGNOSIS — E113299 Type 2 diabetes mellitus with mild nonproliferative diabetic retinopathy without macular edema, unspecified eye: Secondary | ICD-10-CM

## 2023-01-23 MED ORDER — METFORMIN HCL ER 500 MG PO TB24: 500.0000 mg | ORAL_TABLET | Freq: Two times a day (BID) | ORAL | 6 refills | Status: DC

## 2023-01-23 MED ORDER — AMLODIPINE BESYLATE 5 MG PO TABS: 5.0000 mg | ORAL_TABLET | Freq: Every day | ORAL | 1 refills | Status: DC

## 2023-01-23 MED ORDER — NOVOLOG FLEXPEN 100 UNIT/ML ~~LOC~~ SOPN: 12.0000 [IU] | PEN_INJECTOR | Freq: Three times a day (TID) | SUBCUTANEOUS | 1 refills | Status: DC

## 2023-01-23 NOTE — Assessment & Plan Note (Signed)
No retinopathy on latest eye exam 10/2022.

## 2023-01-23 NOTE — Patient Instructions (Addendum)
Stay off Mounjaro.  Continue tresiba and novolog per new sliding scale.  Try increasing metformin XR to 500mg  twice daily. This will cause stomach upset and diarrhea for a few days that should improve. Let us know if stomach symptoms persist past a few days.  Call foot doctor for appointment.  Return in 6 weeks for follow up visit.

## 2023-01-23 NOTE — Assessment & Plan Note (Signed)
Chronic, brittle/difficult to control.  Agree with simplifying novolog SSI as per HPI.  Continue tresiba 32u daily. Stop Mounjaro due to side effects (anorexia, may have worsened hypoglycemia).  Will increase metformin XR to 500mg  BID, discussed to expect GI upset first few days of higher dose.  RTC 6 wks f/u visit.

## 2023-01-23 NOTE — Assessment & Plan Note (Signed)
Pedal edema is improved.  He brings bottle with amlodipine 10mg  tablets although he should be taking only 5mg  - advised cut this in half. See below.

## 2023-01-23 NOTE — Assessment & Plan Note (Signed)
Continue aricept 5mg daily. 

## 2023-01-23 NOTE — Assessment & Plan Note (Signed)
BP stable on current regimen. He brings bottle with amlodipine 10mg  tablets although he should be taking only 5mg  - advised cut this in half. See below

## 2023-01-23 NOTE — Progress Notes (Signed)
Ph: [phone] Fax: [phone]   Patient ID: Anthony [name], male    DOB: 07-19-1946, 77 y.o.   MRN: [id number]  This visit was conducted in person.  BP 130/70   Pulse 68   Temp (!) 97.3 F (36.3 C) (Temporal)   Ht 5' 10.5" (1.791 m)   Wt 214 lb 2 oz (97.1 kg)   SpO2 97%   BMI 30.29 kg/m    CC: DM f/u visit  Subjective:   HPI: Anthony [name] is a 77 y.o. male presenting on 01/23/2023 for Medical Management of Chronic Issues (Here for 4 wk DM f/u.)   See recent phone/care coordination notes for details. Pt endorses daily episodes of hypoglycemia to 40s since Mounjaro started, however CGM only showed 4 sugars <70 over the past several weeks. Dexcom alarm is set to <90.  Per report:             Time in range (70-180): 37% (improved from 32%)             High (180-250): 30%             Very high (>250): 32% (improved from 38%)             Low (< 70): 1% (goal < 4%)             GMI: 8.4% (improved from 8.7%); Average glucose: 213 (improve from 225)   We decided to hold Mounjaro until appt today.   Memory loss complicates diabetes care.  He's been taking Prevagen OTC.   Novolog 12 units in morning with Tresiba 32u then per simplified SSI: Cbg <100 - don't take Cbg 100-200 - 12 units  Cbg 200-300 - 15 units  Cbg >300 - 18 units   DM - does regularly check sugars - see above. Compliant with antihyperglycemic regimen which includes: novolog per SSI, metformin XR 500mg  daily, Tresiba 32u daily. Denies low sugars or hypoglycemic symptoms. Denies paresthesias, blurry vision. Last diabetic eye exam 10/2022. Glucometer brand: DexCom, One Touch. Last foot exam: 02/2022. DSME: not due. Lab Results  Component Value Date   HGBA1C 7.7 (A) 12/15/2022   Diabetic Foot Exam - Simple   Simple Foot Form Diabetic Foot exam was performed with the following findings: Yes 01/23/2023 12:28 PM  Visual Inspection See comments: Yes Sensation Testing Intact to touch and monofilament  testing bilaterally: Yes Pulse Check See comments: Yes Comments 2+ DP on left, diminished on right Pre ulcerative callus to right medial sole at 1st MTPJ    Lab Results  Component Value Date   MICROALBUR 6.6 (H) 04/14/2022         Relevant past medical, surgical, family and social history reviewed and updated as indicated. Interim medical history since our last visit reviewed. Allergies and medications reviewed and updated. Outpatient Medications Prior to Visit  Medication Sig Dispense Refill   aspirin EC 81 MG tablet Take 1 tablet (81 mg total) by mouth daily. Swallow whole. 30 tablet 12   b complex vitamins tablet Take 1 tablet by mouth daily.     Continuous Blood Gluc Sensor (DEXCOM G7 SENSOR) MISC by Does not apply route.     Dextrose, Diabetic Use, (GLUCOSE PO) Take 1 tablet by mouth as needed.     donepezil (ARICEPT) 5 MG tablet Take 1 tablet (5 mg total) by mouth at bedtime. 30 tablet 6   GVOKE HYPOPEN 1-PACK 1 MG/0.2ML SOAJ Inject under skin 0.2 mL as needed for hypoglycemia  0.2 mL 11   insulin degludec (TRESIBA FLEXTOUCH) 200 UNIT/ML FlexTouch Pen inject 32 units into THE SKIN DAILY 27 mL 2   Insulin Pen Needle (ADVOCATE INSULIN PEN NEEDLES) 31G X 5 MM MISC Use 3x a day 300 each 3   olmesartan-hydrochlorothiazide (BENICAR HCT) 40-25 MG tablet Take 1 tablet by mouth daily. 90 tablet 3   ONETOUCH ULTRA test strip USE TO check blood glucose four times daily AS DIRECTED 150 strip 3   pantoprazole (PROTONIX) 40 MG tablet Take 1 tablet (40 mg total) by mouth daily. 90 tablet 3   simvastatin (ZOCOR) 20 MG tablet TAKE 1 TABLET BY MOUTH EVERYDAY AT BEDTIME 90 tablet 3   tadalafil (CIALIS) 20 MG tablet TAKE ONE-HALF TO 1 TABLET BY MOUTH EVERY OTHER DAY AS NEEDED 5 tablet 5   amLODipine (NORVASC) 5 MG tablet Take 1 tablet (5 mg total) by mouth daily. For blood pressure 90 tablet 3   insulin aspart (NOVOLOG FLEXPEN) 100 UNIT/ML FlexPen Inject 12-20 Units into the skin 3 (three) times  daily before meals. 60 mL 1   metFORMIN (GLUCOPHAGE-XR) 500 MG 24 hr tablet TAKE ONE TABLET BY MOUTH EVERY MORNING 30 tablet 3   tirzepatide (MOUNJARO) 2.5 MG/0.5ML Pen Inject 2.5 mg into the skin once a week. For diabetes (Patient not taking: Reported on 01/23/2023) 2 mL 3   No facility-administered medications prior to visit.     Per HPI unless specifically indicated in ROS section below Review of Systems  Objective:  BP 130/70   Pulse 68   Temp (!) 97.3 F (36.3 C) (Temporal)   Ht 5' 10.5" (1.791 m)   Wt 214 lb 2 oz (97.1 kg)   SpO2 97%   BMI 30.29 kg/m   Wt Readings from Last 3 Encounters:  01/23/23 214 lb 2 oz (97.1 kg)  12/15/22 221 lb 4 oz (100.4 kg)  11/30/22 229 lb (103.9 kg)      Physical Exam Vitals and nursing note reviewed.  Constitutional:      Appearance: Normal appearance. He is not ill-appearing.  Eyes:     Extraocular Movements: Extraocular movements intact.     Conjunctiva/sclera: Conjunctivae normal.     Pupils: Pupils are equal, round, and reactive to light.  Cardiovascular:     Rate and Rhythm: Normal rate and regular rhythm.     Pulses: Normal pulses.     Heart sounds: Normal heart sounds. No murmur heard. Pulmonary:     Effort: Pulmonary effort is normal. No respiratory distress.     Breath sounds: Normal breath sounds. No wheezing, rhonchi or rales.  Musculoskeletal:     Right lower leg: No edema.     Left lower leg: No edema.     Comments: See HPI for foot exam if done  Skin:    General: Skin is warm and dry.     Findings: No rash.  Neurological:     Mental Status: He is alert.  Psychiatric:        Mood and Affect: Mood normal.        Behavior: Behavior normal.       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       Relevant Medications   metFORMIN (GLUCOPHAGE-XR) 500 MG 24 hr tablet   insulin aspart (NOVOLOG FLEXPEN) 100 UNIT/ML FlexPen   Hypertension    BP stable on current regimen. He brings bottle with amlodipine 10mg  tablets although he should be taking only 5mg  - advised cut this in half. See below      Relevant Medications   amLODipine (NORVASC) 5 MG tablet   MCI (mild cognitive impairment) with memory loss    Continue aricept 5mg  daily.       Type 2 diabetes mellitus with hypoglycemia ([name]) - Primary    Chronic, brittle/difficult to control.  Agree with simplifying novolog SSI as per HPI.  Continue tresiba 32u daily. Stop Mounjaro due to side effects (anorexia, may have worsened hypoglycemia).  Will increase metformin XR to 500mg  BID, discussed to expect GI upset first few days of higher dose.  RTC 6 wks f/u visit.       Relevant Medications   metFORMIN (GLUCOPHAGE-XR) 500 MG 24 hr tablet   insulin aspart (NOVOLOG FLEXPEN) 100 UNIT/ML FlexPen   Pedal edema    Pedal edema is improved.  He brings bottle with amlodipine 10mg  tablets although he should be taking only 5mg  - advised cut this in half. See below.         Meds ordered this encounter  Medications   metFORMIN (GLUCOPHAGE-XR) 500 MG 24 hr tablet    Sig: Take 1 tablet (500 mg total) by mouth 2 (two) times daily with a meal.    Dispense:  60 tablet    Refill:  6    Note new dose   amLODipine (NORVASC) 5 MG tablet    Sig: Take 1 tablet (5 mg total) by mouth daily. For blood pressure    Dispense:  90 tablet    Refill:  1    Note new  lower dose (pedal edema with 10mg )   insulin aspart (NOVOLOG FLEXPEN) 100 UNIT/ML FlexPen    Sig: Inject 12-18 Units into the skin 3 (three) times daily before meals. Per sliding scale: 12 units 100-200, 15 units 200-300, 18 units >300 cbg    Dispense:  60 mL    Refill:  1    This prescription was filled on 09/06/2022. Any refills authorized will be placed on file.    No orders of the defined types were placed in this encounter.   Patient Instructions  Stay off Mounjaro.  Continue tresiba and novolog per new sliding scale.  Try increasing metformin XR to 500mg  twice daily. This will cause stomach upset and diarrhea for a few days that should improve. Let us know if stomach symptoms persist past a few days.  Call foot doctor for appointment.  Return in 6 weeks for follow up visit.   Follow up plan: Return in about 6 weeks (around 03/06/2023) for follow up visit.  [name], MD

## 2023-01-25 ENCOUNTER — Other Ambulatory Visit: Payer: Self-pay | Admitting: Family Medicine

## 2023-01-25 DIAGNOSIS — N529 Male erectile dysfunction, unspecified: Secondary | ICD-10-CM

## 2023-01-29 ENCOUNTER — Telehealth: Payer: Self-pay

## 2023-01-29 NOTE — Progress Notes (Signed)
Care Management & Coordination Services Pharmacy Team  Patient was also reminded of his telephone appointment with Anthony [name], PharmD on 02/01/2023 at 11:45.  Reason for Encounter: Medication Coordination and Delivery  Contacted patient to discuss medications and coordinate delivery from [facility].  Spoke with patient on 01/29/2023   Cycle dispensing form sent to [facility], [facility] for review.   Last adherence delivery date: 01/09/2023      Patient is due for next adherence delivery on:  02/07/2023  This delivery to include:   Vials  30 Days  Easy Open Caps  Simvastatin 20 mg Take 1 tablet every day at bedtime OneTouch Test strips - Use up to four times daily Tadalafil 20 mg - 1/2 to 1 tablet daily as needed- PRN use Olmesartan/HCTZ 40-25 mg Take 1 tablet by mouth every day Amlodipine 10 mg 1 tablet daily Pantoprazole 40 mg 1 tablet daily Novolog Flexpen Inject 12-20 units into the skin 3 times daily with meals Tresiba Flex 200 units- 32 units once daily in morning Donepezil 5mg - take 1 tablet bedtime  Metformin XR 500mg -take 1 tablet in morning   Discontinued on 01/23/23 due to side effects per PCP Mounjaro 2.5 mg  Patient gets OTC: Aspirin 81 mg - 1 tablet daily   Patient declined the following medications this month: None  No refill request needed.  Confirmed delivery date of 02/07/2023, advised patient that pharmacy will contact them the morning of delivery.   Any concerns about your medications? No  How often do you forget or accidentally miss a dose? Rarely  Do you use a pillbox? No  Is patient in packaging No  Recent blood pressure readings are as follows: Patient does not take readings at home.   Chart review: Recent office visits:  01/23/23 Anthony Boyden, MD DM Change: Insulin Aspart 12 -18 Units vs. 12 - 20 units Change: Metformin 500 mg 2 times daily vs. 1 time daily Stop: Mounjaro due to side effects  Recent consult visits:   None since last contact  Hospital visits:  None in previous 6 months  Medications: Outpatient Encounter Medications as of 01/29/2023  Medication Sig   amLODipine (NORVASC) 5 MG tablet Take 1 tablet (5 mg total) by mouth daily. For blood pressure   aspirin EC 81 MG tablet Take 1 tablet (81 mg total) by mouth daily. Swallow whole.   b complex vitamins tablet Take 1 tablet by mouth daily.   Continuous Blood Gluc Sensor (DEXCOM G7 SENSOR) MISC by Does not apply route.   Dextrose, Diabetic Use, (GLUCOSE PO) Take 1 tablet by mouth as needed.   donepezil (ARICEPT) 5 MG tablet Take 1 tablet (5 mg total) by mouth at bedtime.   GVOKE HYPOPEN 1-PACK 1 MG/0.2ML SOAJ Inject under skin 0.2 mL as needed for hypoglycemia   insulin aspart (NOVOLOG FLEXPEN) 100 UNIT/ML FlexPen Inject 12-18 Units into the skin 3 (three) times daily before meals. Per sliding scale: 12 units 100-200, 15 units 200-300, 18 units >300 cbg   insulin degludec (TRESIBA FLEXTOUCH) 200 UNIT/ML FlexTouch Pen inject 32 units into THE SKIN DAILY   Insulin Pen Needle (ADVOCATE INSULIN PEN NEEDLES) 31G X 5 MM MISC Use 3x a day   metFORMIN (GLUCOPHAGE-XR) 500 MG 24 hr tablet Take 1 tablet (500 mg total) by mouth 2 (two) times daily with a meal.   olmesartan-hydrochlorothiazide (BENICAR HCT) 40-25 MG tablet Take 1 tablet by mouth daily.   ONETOUCH ULTRA test strip USE TO check blood glucose four times  daily AS DIRECTED   pantoprazole (PROTONIX) 40 MG tablet Take 1 tablet (40 mg total) by mouth daily.   simvastatin (ZOCOR) 20 MG tablet TAKE 1 TABLET BY MOUTH EVERYDAY AT BEDTIME   tadalafil (CIALIS) 20 MG tablet TAKE 1/2 TO 1 TABLET BY MOUTH EVERY OTHER DAY AS NEEDED   No facility-administered encounter medications on file as of 01/29/2023.   BP Readings from Last 3 Encounters:  01/23/23 130/70  12/15/22 128/74  11/30/22 136/66    Pulse Readings from Last 3 Encounters:  01/23/23 68  12/15/22 75  11/30/22 64    Lab Results  Component  Value Date/Time   HGBA1C 7.7 (A) 12/15/2022 11:39 AM   HGBA1C 7.2 (A) 09/13/2022 11:16 AM   HGBA1C 7.7 (H) 03/02/2020 10:26 AM   HGBA1C 7.8 (H) 01/12/2020 10:21 AM   Lab Results  Component Value Date   CREATININE 0.98 09/15/2022   BUN 22 09/15/2022   GFR 76.10 09/15/2022   GFRNONAA >60 03/02/2020   GFRAA >60 03/02/2020   NA 139 09/15/2022   K 4.1 09/15/2022   CALCIUM 9.1 09/15/2022   CO2 28 09/15/2022   Anthony [name], CPP notified   Anthony [name], [place] Clinical Pharmacy Assistant [phone]

## 2023-02-01 ENCOUNTER — Ambulatory Visit: Payer: Medicare HMO | Admitting: Pharmacist

## 2023-02-01 NOTE — Progress Notes (Signed)
Care Management & Coordination Services Pharmacy Note  02/01/2023 Name:  Anthony [name] MRN:  [id number] DOB:  1945-10-23  Summary: F/U visit -DM: A1c 7.7% (11/2022), though query accuracy of A1c as CGM shows significantly worse control; pt now off Mounjaro and has increased metformin to 500 mg BID; it is not clear that he is taking Novolog per sliding scale exactly as directed but he has at least been making an effort to do so -pt also reports frequent hypoglycemia (almost daily), however per CGM there are no documented episodes of hypoglycemia <70 - per review of alarms, his Dexcom alarms for "Low" when glucose is <90; discussed with patient that alarms do not necessarily mean he is low, but could be warning that he may be low soon -Reviewed AGP report: 01/19/23 to 02/01/23. Sensor active: 99%  Time in range (70-180): 27% (worsened from 37%)  High (180-250): 28%  Very high (>250): 45% (worsened from 32%)  Low (< 70): 0% (goal < 4%)  GMI: 9.0% (worsened from 8.5%); Average glucose: 237   Recommendations/Changes made from today's visit: -Advised to use Novolog sliding scale (previously provided) with each meal; discussed basing Novolog dose off of Dexcom reading 15 min before eating Blood sugar before meals  Novolog Dose  Less than 100  Do Not Take  100-200  12 units  200-300  15 units  More than 300  18 units   Follow up plan: -CC RN 02/14/23 -Pharmacist follow up televisit scheduled for 1 month -PCP appt 03/06/23    Subjective: Anthony [name] is an 77 y.o. year old male who is a primary patient of [name], MD.  The care coordination team was consulted for assistance with disease management and care coordination needs.    Engaged with patient by telephone for follow up visit.  Recent office visits: 01/23/23 Dr Sharen Hones OV: f/u - d/c Mounjaro. Increase metformin to 500 mg BID. RTC 6 weeks.  12/15/22 Dr Sharen Hones OV: f/u - continue amlodipine 5 mg. TIR 32%, 33% high, 34%  very high. Trial Mounjaro 2.5 mg weekly. Start dropping novolog to 5 units/meal.  11/30/22 Dr Alphonsus Sias OV: edema - try support socks. Cut amlodipine in 1/2.  09/15/22 Dr Sharen Hones OV: f/u - start donepezil 5 mg. Encouraged Novolog 15 min prior to meals. Refer to CC for caregiver strain.  04/19/22 Dr Sharen Hones OV: annual - no changes. MMSE 24/30 - mild memory loss. F/u 3 months - memory.   Recent consult visits: 10/05/22 Dr Izora Ribas (Cardiology): CAD - start aspirin 81 mg. Increase amlodipine to 10 mg. Ordered PFT  09/13/22 Dr Elvera Lennox (Endocrine): A1c 7.2%; GMI 8.6% (TIR 35%); pt taking Ozempic 0.5 mg weekly; advised centralizing medical care and directed to PCP for DM control.   03/06/22 NP Nada Boozer (Cardiology): preop clearance, acceptable risk.   03/02/22 Dr Elvera Lennox (Endocrine): DM - A1c 7.3%; insulin compliance is main issue - not taking Novolog in AM or out to dinner; CGM - TIR 34%, GMI 8.6%; BG is swinging widely, cannot be cleared for knee surgery. No changes to prescribed regimen, just improve compliance.  Hospital visits: None in previous 6 months   Objective:  Lab Results  Component Value Date   CREATININE 0.98 09/15/2022   BUN 22 09/15/2022   GFR 76.10 09/15/2022   GFRNONAA >60 03/02/2020   GFRAA >60 03/02/2020   NA 139 09/15/2022   K 4.1 09/15/2022   CALCIUM 9.1 09/15/2022   CO2 28 09/15/2022   GLUCOSE 279 (H) 09/15/2022  Lab Results  Component Value Date/Time   HGBA1C 7.7 (A) 12/15/2022 11:39 AM   HGBA1C 7.2 (A) 09/13/2022 11:16 AM   HGBA1C 7.7 (H) 03/02/2020 10:26 AM   HGBA1C 7.8 (H) 01/12/2020 10:21 AM   FRUCTOSAMINE 284 09/15/2022 11:47 AM   FRUCTOSAMINE 314 (H) 04/14/2022 10:55 AM   GFR 76.10 09/15/2022 11:47 AM   GFR 81.27 04/14/2022 10:54 AM   MICROALBUR 6.6 (H) 04/14/2022 10:54 AM   MICROALBUR 6.3 (H) 06/28/2015 09:39 AM    Last diabetic Eye exam:  Lab Results  Component Value Date/Time   HMDIABEYEEXA No Retinopathy 11/21/2022 12:00 AM    Last  diabetic Foot exam:  Lab Results  Component Value Date/Time   HMDIABFOOTEX done by podiatry - in chart 05/19/2019 12:00 AM     Lab Results  Component Value Date   CHOL 128 04/14/2022   HDL 40.80 04/14/2022   LDLCALC 64 04/14/2022   LDLDIRECT 94.0 10/05/2016   TRIG 115.0 04/14/2022   CHOLHDL 3 04/14/2022       Latest Ref Rng & Units 09/15/2022   11:47 AM 04/14/2022   10:54 AM 08/08/2021    3:54 PM  Hepatic Function  Total Protein 6.0 - 8.3 g/dL 6.3  6.1    Albumin 3.5 - 5.2 g/dL 4.1  4.0    AST 0 - 37 U/L 12  13    ALT 0 - 53 U/L 11  14  14    Alk Phosphatase 39 - 117 U/L 94  83    Total Bilirubin 0.2 - 1.2 mg/dL 0.6  1.0      Lab Results  Component Value Date/Time   TSH 1.97 09/15/2022 11:47 AM   TSH 2.05 04/14/2022 10:55 AM   FREET4 0.88 06/26/2014 09:05 AM       Latest Ref Rng & Units 02/24/2022    4:09 PM 03/02/2020   10:26 AM 01/05/2017   12:28 PM  CBC  WBC 3.8 - 10.8 Thousand/uL 7.0  8.5  5.7   Hemoglobin 13.2 - 17.1 g/dL 09.8  11.9  14.7   Hematocrit 38.5 - 50.0 % 40.7  42.8  41.3   Platelets 140 - 400 Thousand/uL 230  227  209.0     Lab Results  Component Value Date/Time   VITAMINB12 418 09/15/2022 11:47 AM   VITAMINB12 707 03/08/2021 03:08 PM    Clinical ASCVD: Yes  The ASCVD Risk score (Arnett DK, et al., 2019) failed to calculate for the following reasons:   The valid total cholesterol range is 130 to 320 mg/dL       04/25/5620   30:86 PM 12/15/2022   11:56 AM 09/20/2022    2:38 PM  Depression screen PHQ 2/9  Decreased Interest 1 0 0  Down, Depressed, Hopeless 1 0 0  PHQ - 2 Score 2 0 0  Altered sleeping 0    Tired, decreased energy 0    Change in appetite 2    Feeling bad or failure about yourself  0    Trouble concentrating 0    Moving slowly or fidgety/restless 2    Suicidal thoughts 0    PHQ-9 Score 6    Difficult doing work/chores Somewhat difficult       Social History   Tobacco Use  Smoking Status Former   Packs/day: 0.30    Years: 50.00   Additional pack years: 0.00   Total pack years: 15.00   Types: Cigarettes   Quit date: 08/29/2011   Years since quitting: 11.4  Smokeless Tobacco Never   BP Readings from Last 3 Encounters:  01/23/23 130/70  12/15/22 128/74  11/30/22 136/66   Pulse Readings from Last 3 Encounters:  01/23/23 68  12/15/22 75  11/30/22 64   Wt Readings from Last 3 Encounters:  01/23/23 214 lb 2 oz (97.1 kg)  12/15/22 221 lb 4 oz (100.4 kg)  11/30/22 229 lb (103.9 kg)   BMI Readings from Last 3 Encounters:  01/23/23 30.29 kg/m  12/15/22 31.30 kg/m  11/30/22 32.39 kg/m    No Known Allergies  Medications Reviewed Today     Reviewed by Kathyrn Sheriff, [facility] (Pharmacist) on 02/05/23 at 1449  Med List Status: <None>   Medication Order Taking? Sig Documenting Provider Last Dose Status Informant  amLODipine (NORVASC) 5 MG tablet [id number] Yes Take 1 tablet (5 mg total) by mouth daily. For blood pressure [name], MD Taking Active   aspirin EC 81 MG tablet [id number] Yes Take 1 tablet (81 mg total) by mouth daily. Swallow whole. Christell Constant, MD Taking Active   b complex vitamins tablet [id number] Yes Take 1 tablet by mouth daily. [provider] Taking Active Self  Continuous Blood Gluc Sensor (DEXCOM G7 SENSOR) MISC [id number] Yes by Does not apply route. [provider] Taking Active   Dextrose, Diabetic Use, (GLUCOSE PO) [id number] Yes Take 1 tablet by mouth as needed. [provider] Taking Active Self  donepezil (ARICEPT) 5 MG tablet [id number] Yes Take 1 tablet (5 mg total) by mouth at bedtime. [name], MD Taking Active   GVOKE HYPOPEN 1-PACK 1 MG/0.2ML Ivory Broad [id number] Yes Inject under skin 0.2 mL as needed for hypoglycemia Carlus Pavlov, MD Taking Active   insulin aspart (NOVOLOG FLEXPEN) 100 UNIT/ML FlexPen [id number] Yes Inject 12-18 Units into the skin 3 (three) times daily before meals. Per sliding scale: 12 units  100-200, 15 units 200-300, 18 units >300 cbg [name], MD Taking Active   insulin degludec (TRESIBA FLEXTOUCH) 200 UNIT/ML FlexTouch Pen [id number] Yes inject 32 units into THE SKIN DAILY Carlus Pavlov, MD Taking Active   Insulin Pen Needle (ADVOCATE INSULIN PEN NEEDLES) 31G X 5 MM MISC [id number] Yes Use 3x a day Carlus Pavlov, MD Taking Active Self  metFORMIN (GLUCOPHAGE-XR) 500 MG 24 hr tablet [id number] Yes Take 1 tablet (500 mg total) by mouth 2 (two) times daily with a meal. [name], MD Taking Active   olmesartan-hydrochlorothiazide [facility] HCT) 40-25 MG tablet [id number] Yes Take 1 tablet by mouth daily. [name], MD Taking Active   [facility] ULTRA test strip [id number] Yes USE TO check blood glucose four times daily AS DIRECTED Carlus Pavlov, MD Taking Active   pantoprazole (PROTONIX) 40 MG tablet [id number] Yes Take 1 tablet (40 mg total) by mouth daily. [name], MD Taking Active   simvastatin (ZOCOR) 20 MG tablet [id number] Yes TAKE 1 TABLET BY MOUTH EVERYDAY AT BEDTIME [name], MD Taking Active   tadalafil (CIALIS) 20 MG tablet [id number] Yes TAKE 1/2 TO 1 TABLET BY MOUTH EVERY OTHER DAY AS NEEDED [name], MD Taking Active             SDOH:  (Social Determinants of Health) assessments and interventions performed: Yes SDOH Interventions    Flowsheet Row Care Coordination from 09/20/2022 in [facility] Care Coordination Care Coordination from 09/07/2022 in [facility] Chronic Care Management from 02/15/2021 in [facility] at [facility] Chronic Care Management from 01/20/2021 in [facility] HealthCare at  [facility] Chronic Care Management from 10/15/2020 in [facility] at [facility] Chronic Care Management from 08/25/2020 in [facility] at [place]  SDOH Interventions        Food Insecurity Interventions Intervention Not  Indicated -- Intervention Not Indicated -- -- --  Housing Interventions Intervention Not Indicated Intervention Not Indicated Intervention Not Indicated -- -- --  Transportation Interventions Intervention Not Indicated -- Intervention Not Indicated -- -- --  Financial Strain Interventions -- Intervention Not Indicated -- Other (Comment)  [Apply for Ozempic assistance in coverage gap] Intervention Not Indicated  [Medications affordable] Intervention Not Indicated      SDOH Screenings   Food Insecurity: No Food Insecurity (09/20/2022)  Housing: Low Risk  (09/20/2022)  Transportation Needs: No Transportation Needs (09/20/2022)  Alcohol Screen: Low Risk  (01/12/2020)  Depression (PHQ2-9): Medium Risk (01/23/2023)  Financial Resource Strain: Low Risk  (09/07/2022)  Physical Activity: Inactive (04/13/2022)  Stress: No Stress Concern Present (04/13/2022)  Tobacco Use: Medium Risk (01/23/2023)    Medication Assistance: None required.  Patient affirms current coverage meets needs.  Medication Access: Within the past 30 days, how often has patient missed a dose of medication? 0 Is a pillbox or other method used to improve adherence? Yes  Factors that may affect medication adherence? lack of understanding of disease management Are meds synced by current pharmacy? Yes  Are meds delivered by current pharmacy? Yes  Does patient experience delays in picking up medications due to transportation concerns? No  Name and location of Current pharmacy:  [facility] - [place], [place] - [address] [place] [address] Phone: [phone] Fax: [phone]  [facility] services reviewed with patient today?: Yes  30-day vials: Delivery 12/11/22 Simvastatin 20 mg Take 1 tablet every day at bedtime OneTouch Test strips - Use up to four times daily Tadalafil 20 mg - 1/2 to 1 tablet daily as needed- PRN use Olmesartan/HCTZ 40-25 mg Take 1 tablet by  mouth every day Amlodipine 10 mg 1 tablet daily Pantoprazole 40 mg 1 tablet daily Novolog Flexpen Inject 12-20 units into the skin 3 times daily with meals Tresiba Flex 200 units- 32 units once daily in morning Donepezil 5mg - take 1 tablet bedtime  Metformin XR 500mg -take 1 tablet in morning   Compliance/Adherence/Medication fill history: Care Gaps: None  Star-Rating Drugs: Simvastatin - PDC 99% Olmesartan-HCTZ - PDC 100% Metformin - PDC 61%; only filling regularly since 08/2022     ASSESSMENT / PLAN  Diabetes (A1c goal <8%) -Not ideally controlled - A1c 7.7% (11/2022), increase from 7.3% previously; A1c has not been accurate in comparison to CGM data which shows worse control;  pt now off Mounjaro and has increased metformin to 500 mg BID -pt also reports frequent hypoglycemia (almost daily), however per CGM there are no documented episodes of hypoglycemia <70 - per review of alarms, his Dexcom alarms for "Low" when glucose is <90; discussed with patient that alarms do not necessarily mean he is low, but could be warning that he may be low soon -No longer following with endocrine; hx severe hypoglycemia and memory impairment -DM dx 2006, insulin since 2008 -Current meal patterns: eats 2 meals/day (late breakfast ~11 AM, early dinner ~ 5 PM) -Current exercise: tries to walk when he can -Reviewed AGP report: 01/19/23 to 02/01/23. Sensor active: 99%  Time in range (70-180): 27% (goal > 70%)  High (180-250): 28%  Very high (>250): 45%  Low (< 70): 0% (goal < 4%)  GMI: 9.0%; Average glucose: 237  Previous AGP report: 01/05/23 to 01/18/23. Sensor active: 99%  Time in range (70-180): 37% (goal > 70%)  High (180-250): 30%  Very high (>250): 32%  Low (< 70): 1% (goal < 4%)  GMI: 8.4%; Average glucose: 213   -Current medications: Metformin 500 mg BID - Appropriate, Effective, Safe, Accessible Novolog 08-11-17 u w meals - Appropriate, Query Effective Tresiba U200 32 units daily -  Appropriate, Query Effective Gvoke Hypopen PRN - not used Dexcom G7 -Appropriate, Effective, Safe, Accessible -Medications previously tried: Ozempic 1 mg (low appetite) -Educated on A1c and blood sugar goals; consequences of high BG on wound healing/surgery outcomes -Recommend to continue current medication  Hypertension (BP goal <140/90) -Controlled - per OV readings -Current home readings: n/a; Denies hypotensive/hypertensive symptoms -Current treatment: Amlodipine 5 mg daily - Appropriate, Effective, Safe, Accessible Olmesartan-HCTZ 40-25 mg daily -Appropriate, Effective, Safe, Accessible -Medications previously tried: n/a  -Educated on BP goals and benefits of medications for prevention of heart attack, stroke and kidney damage; -Counseled to monitor BP at home periodically -Recommended to continue current medication  Hyperlipidemia: (LDL goal < 70) -Controlled - LDL 64 (03/2022) at goal -Hx aortic atherosclerosis -Current treatment: Simvastatin 20 mg daily HS - Appropriate, Effective, Safe, Accessible -Medications previously tried: n/a  -Educated on Cholesterol goals; Benefits of statin for ASCVD risk reduction; -Recommended to continue current medication  MCI -Not ideally controlled -Current treatment  Donepezil 5 mg daily HS - Appropriate, Effective, Safe, Accessible -Medications previously tried: n/a  -Recommended to continue current medication  Health Maintenance -Hx COPD, stable period off inhalers   Al Corpus, PharmD, BCACP Clinical Pharmacist [facility] [phone]

## 2023-02-05 ENCOUNTER — Other Ambulatory Visit: Payer: Self-pay | Admitting: Family Medicine

## 2023-02-05 DIAGNOSIS — N529 Male erectile dysfunction, unspecified: Secondary | ICD-10-CM

## 2023-02-07 ENCOUNTER — Encounter: Payer: Self-pay | Admitting: Podiatry

## 2023-02-07 ENCOUNTER — Ambulatory Visit: Payer: Medicare HMO | Admitting: Podiatry

## 2023-02-07 DIAGNOSIS — B351 Tinea unguium: Secondary | ICD-10-CM

## 2023-02-07 DIAGNOSIS — L84 Corns and callosities: Secondary | ICD-10-CM

## 2023-02-07 DIAGNOSIS — M79675 Pain in left toe(s): Secondary | ICD-10-CM | POA: Diagnosis not present

## 2023-02-07 DIAGNOSIS — E1142 Type 2 diabetes mellitus with diabetic polyneuropathy: Secondary | ICD-10-CM

## 2023-02-07 DIAGNOSIS — M79674 Pain in right toe(s): Secondary | ICD-10-CM | POA: Diagnosis not present

## 2023-02-07 NOTE — Progress Notes (Signed)
  Subjective:  Patient ID: Anthony [name], male    DOB: 09-Apr-1946,   MRN: [id number]  No chief complaint on file.   77 y.o. male presents for concern of thickened elongated and painful nails that are difficult to trim. Requesting to have them trimmed today. Relates burning and tingling in their feet. Patient is diabetic and last A1c was  Lab Results  Component Value Date   HGBA1C 7.7 (A) 12/15/2022   .   PCP:  [name], MD    . Denies any other pedal complaints. Denies n/v/f/c.   Past Medical History:  Diagnosis Date   Aortic atherosclerosis ([name]) 02/28/2020   By CT   Barrett's esophagus 12/08/2015   By EGD 2010 [name]    BPH with obstruction/lower urinary tract symptoms 2014   s/p TURP   COPD (chronic obstructive pulmonary disease) ([name]) 02/28/2020   By CT: diffuse bronchial wall thickening with mild centrilobular and paraseptal emphysema (01/2020)   Coronary artery calcification seen on CAT scan 02/28/2020   3v CAD - calcified atherosclerotic plaque in the left main, left anterior descending and left circumflex coronary arteries (01/2020)   Diabetes mellitus type 2 with complications ([name])    completed DMSE   DKA (diabetic ketoacidoses) 01/2010   "in coma"   Ex-smoker 01/21/2020   GERD (gastroesophageal reflux disease)    HTN (hypertension)    Hyperlipidemia    controlled with medicine   Osteoarthritis    Rheumatic fever    maybe as child    Objective:  Physical Exam: Vascular: DP/PT pulses 2/4 bilateral. CFT <3 seconds. Normal hair growth on digits. No edema.  Skin. No lacerations or abrasions bilateral feet. Hyperkeratotic lesion noted sub first metatarsal head on right.  Musculoskeletal: MMT 5/5 bilateral lower extremities in DF, PF, Inversion and Eversion. Deceased ROM in DF of ankle joint.  Neurological: Sensation intact to light touch.   Assessment:   1. Pain due to onychomycosis of toenails of both feet   2. Diabetic peripheral neuropathy  associated with type 2 diabetes mellitus ([name])      Plan:  Patient was evaluated and treated and all questions answered. -Discussed and educated patient on diabetic foot care, especially with  regards to the vascular, neurological and musculoskeletal systems.  -Stressed the importance of good glycemic control and the detriment of not  controlling glucose levels in relation to the foot. -Discussed supportive shoes at all times and checking feet regularly.  -Mechanically debrided all nails 1-5 bilateral using sterile nail nipper and filed with dremel without incident  -Hyperkeratotic tissue debrided without incident using chisel.  -Answered all patient questions -Patient to return  in 3 months for at risk foot care -Patient advised to call the office if any problems or questions arise in the meantime.   Louann Sjogren, DPM

## 2023-02-14 ENCOUNTER — Ambulatory Visit: Payer: Self-pay

## 2023-02-14 NOTE — Patient Instructions (Signed)
Visit Information  Thank you for taking time to visit with me today. Please don't hesitate to contact me if I can be of assistance to you.   Following are the goals we discussed today:  Please notify your provider of urinary symptoms. Take diabetic medications as instructed.   Notify provider of ongoing low/ high blood sugars.   Our next appointment is by telephone on 03/19/23 at 11:30 am  Please call the care guide team at [phone] if you need to cancel or reschedule your appointment.   If you are experiencing a Mental Health or Behavioral Health Crisis or need someone to talk to, please call the Suicide and Crisis Lifeline: 988 call 1-800-273-TALK (toll free, 24 hour hotline)  Patient verbalizes understanding of instructions and care plan provided today and agrees to view in MyChart. Active MyChart status and patient understanding of how to access instructions and care plan via MyChart confirmed with patient.     George Ina RN,BSN,CCM [facility] Care Coordination [phone] direct line

## 2023-02-14 NOTE — Patient Outreach (Signed)
  Care Coordination   Follow Up Visit Note   02/14/2023 Name: Anthony [name] MRN: [id number] DOB: Oct 21, 1945  Anthony [name] is a 77 y.o. year old male who sees [name], MD for primary care. I spoke with  [name] by phone today.  What matters to the patients health and wellness today?   DM:  Patient continues to reports frequent low blood sugars. He states his blood sugars are low in the middle of the night or early am ranging 70's-80's. He states his CGM alarms him.  Patient states his blood sugar increases right before bed >250.  Patient reports he is not taking Mounjaro and is taking the metformin 500 mg.  Urinary symptoms: Patient reports noticing over the past 2 days blood in his urine when he wakes up.  He states, " it red like blood."  Patient reports his urine clears up by lunch time.   Patient reports his urine has a dark yellow look. Denies any additional symptoms.     Goals Addressed             This Visit's Progress    Patient Stated: Controlling my blood sugars       Interventions Today    Flowsheet Row Most Recent Value  Chronic Disease   Chronic disease during today's visit Diabetes, Other  [urinary symptoms.]  General Interventions   General Interventions Discussed/Reviewed General Interventions Reviewed, Doctor Visits  [evaluation of current treatment plan for diabetes/ urinary symptoms and patients adherence to plan as established by provider.  Assessed for blood sugar readings. Assessed for urinary symptoms.]  Doctor Visits Discussed/Reviewed Doctor Visits Reviewed  [provider visits reviewed and discussed. Pt. advised that provider should be notified of blood in urine. RNCM offered to contact PCP office and scheduled visit to be seen by provider for blood in urine.  Message sent to PCP regarding blood in pt. urine.]  Pharmacy Interventions   Pharmacy Dicussed/Reviewed Pharmacy Topics Reviewed  [medications reviewed and compliance discussed.  Confirmed patient not taking Mounjara as discussed with pharmacist. Discussed with  patient taking sliding scale insulin as prescribed.]                SDOH assessments and interventions completed:  No     Care Coordination Interventions:  Yes, provided   Follow up plan: Follow up call scheduled for 03/19/23    Encounter Outcome:  Pt. Visit Completed   George Ina RN,BSN,CCM [facility] Care Coordination [phone] direct line

## 2023-02-26 ENCOUNTER — Other Ambulatory Visit: Payer: Self-pay | Admitting: Family Medicine

## 2023-02-26 DIAGNOSIS — N529 Male erectile dysfunction, unspecified: Secondary | ICD-10-CM

## 2023-03-06 ENCOUNTER — Encounter: Payer: Self-pay | Admitting: Family Medicine

## 2023-03-06 ENCOUNTER — Other Ambulatory Visit: Payer: Self-pay | Admitting: Family Medicine

## 2023-03-06 ENCOUNTER — Telehealth: Payer: Self-pay

## 2023-03-06 ENCOUNTER — Ambulatory Visit (INDEPENDENT_AMBULATORY_CARE_PROVIDER_SITE_OTHER): Payer: Medicare HMO | Admitting: Family Medicine

## 2023-03-06 VITALS — BP 134/66 | HR 82 | Temp 97.9°F | Ht 70.0 in | Wt 216.0 lb

## 2023-03-06 DIAGNOSIS — Z638 Other specified problems related to primary support group: Secondary | ICD-10-CM

## 2023-03-06 DIAGNOSIS — Z794 Long term (current) use of insulin: Secondary | ICD-10-CM

## 2023-03-06 DIAGNOSIS — N529 Male erectile dysfunction, unspecified: Secondary | ICD-10-CM

## 2023-03-06 DIAGNOSIS — M1712 Unilateral primary osteoarthritis, left knee: Secondary | ICD-10-CM

## 2023-03-06 DIAGNOSIS — G3184 Mild cognitive impairment, so stated: Secondary | ICD-10-CM

## 2023-03-06 DIAGNOSIS — E11649 Type 2 diabetes mellitus with hypoglycemia without coma: Secondary | ICD-10-CM

## 2023-03-06 DIAGNOSIS — M542 Cervicalgia: Secondary | ICD-10-CM

## 2023-03-06 DIAGNOSIS — M170 Bilateral primary osteoarthritis of knee: Secondary | ICD-10-CM | POA: Diagnosis not present

## 2023-03-06 DIAGNOSIS — M47812 Spondylosis without myelopathy or radiculopathy, cervical region: Secondary | ICD-10-CM | POA: Insufficient documentation

## 2023-03-06 DIAGNOSIS — M1711 Unilateral primary osteoarthritis, right knee: Secondary | ICD-10-CM

## 2023-03-06 MED ORDER — KETOROLAC TROMETHAMINE 30 MG/ML IJ SOLN
30.0000 mg | Freq: Once | INTRAMUSCULAR | Status: AC
Start: 2023-03-06 — End: 2023-03-06
  Administered 2023-03-06: 30 mg via INTRAMUSCULAR

## 2023-03-06 NOTE — Assessment & Plan Note (Signed)
Chronic, brittle diabetes.  He is unsure if he took tresiba today - doesn't remember due to neck pain. I asked him to start logging type and dose of insulin each shot - on smart phone DexCom APP, I showed him where to document this.  RTC 4 wk DM f/u, will review readings at that time

## 2023-03-06 NOTE — Assessment & Plan Note (Signed)
Notes wife's memory progressively deteriorating.  Hesitant to leave her home alone any due to this.  Broached subject of moving to ALF. He is hesitant. Discussed safety concerns especially as he also has memory difficulties.

## 2023-03-06 NOTE — Telephone Encounter (Signed)
Too soon. Rx sent on 02/27/23, #5/3 to [facility].  Request denied.

## 2023-03-06 NOTE — Assessment & Plan Note (Signed)
Continues aricept 5mg  nightly.

## 2023-03-06 NOTE — Patient Instructions (Addendum)
Start logging on your phone when you give yourself insulin shots - both novolog and tresiba, and how many units.  Return in 4 weeks for follow up visit to review #s.  For neck - take toradol shot 30mg  today.  Continue tylenol 1000mg  twice daily. Use heating pad to the neck, but covered in a towel. Let us know if not improving with this

## 2023-03-06 NOTE — Assessment & Plan Note (Addendum)
Story/exam most consistent with acute R sided neck strain with resultant torticollis. No red flags today. Rx toradol IM 30mg  in office, rec start heating pad at home, continue tylenol 1000mg  BID. Update if not improving with this, consider PM&R referral to consider further treatment.

## 2023-03-06 NOTE — Progress Notes (Signed)
Ph: [phone] Fax: [phone]   Patient ID: Anthony [name], male    DOB: [birth year], 77 y.o.   MRN: [id number]  This visit was conducted in person.  BP 134/66   Pulse 82   Temp 97.9 F (36.6 C) (Temporal)   Ht 5\' 10"  (1.778 m)   Wt 216 lb (98 kg)   SpO2 97%   BMI 30.99 kg/m    CC: 6 wk f/u visit, neck pain  Subjective:   HPI: Anthony [name] is a 77 y.o. male presenting on 03/06/2023 for Medical Management of Chronic Issues (Here for 6 wk DM f/u.) and Neck Pain (C/o neck pain. Started 03/04/23. Woke up with pain- able to rotate. Also, c/o B knee pain. )   2d h/o R sided neck pain limiting ROM of R lateral rotation. Having severe pain. Denies inciting trauma/injury or falls. Managing with tylenol. No shooting pain down arms, numbness or weakness.   Bilateral knee pain - known end stage osteoarthritis, was previously unable to have surgery due to uncontrolled sugar levels.   DM - does regularly check sugars with DexCom G7. Compliant with antihyperglycemic regimen which includes: tresiba 32u daily, metformin XR 500mg  BID, novolog SSI - infrequent use. He doesn't remember if he took tresiba this morning. Last diabetic eye exam 10/2022. Last foot exam: 12/2022. Has previously been prescribed Gvoke glucagon pen for hypoglycemia.  Lab Results  Component Value Date   HGBA1C 7.7 (A) 12/15/2022   Diabetic Foot Exam - Simple   No data filed    Lab Results  Component Value Date   MICROALBUR 6.6 (H) 04/14/2022    CGM data: DexCom G7 30 day average sugar: 221, time in range 34%, hypoglycemia <1%, stage 1 hyperglycemia 30%, stage 2 hyperglycemia 35%.       Relevant past medical, surgical, family and social history reviewed and updated as indicated. Interim medical history since our last visit reviewed. Allergies and medications reviewed and updated. Outpatient Medications Prior to Visit  Medication Sig Dispense Refill   amLODipine (NORVASC) 5 MG tablet Take 1 tablet (5 mg  total) by mouth daily. For blood pressure 90 tablet 1   aspirin EC 81 MG tablet Take 1 tablet (81 mg total) by mouth daily. Swallow whole. 30 tablet 12   b complex vitamins tablet Take 1 tablet by mouth daily.     Continuous Blood Gluc Sensor (DEXCOM G7 SENSOR) MISC by Does not apply route.     Dextrose, Diabetic Use, (GLUCOSE PO) Take 1 tablet by mouth as needed.     donepezil (ARICEPT) 5 MG tablet Take 1 tablet (5 mg total) by mouth at bedtime. 30 tablet 6   GVOKE HYPOPEN 1-PACK 1 MG/0.2ML SOAJ Inject under skin 0.2 mL as needed for hypoglycemia 0.2 mL 11   insulin aspart (NOVOLOG FLEXPEN) 100 UNIT/ML FlexPen Inject 12-18 Units into the skin 3 (three) times daily before meals. Per sliding scale: 12 units 100-200, 15 units 200-300, 18 units >300 cbg 60 mL 1   insulin degludec (TRESIBA FLEXTOUCH) 200 UNIT/ML FlexTouch Pen inject 32 units into THE SKIN DAILY 27 mL 2   Insulin Pen Needle (ADVOCATE INSULIN PEN NEEDLES) 31G X 5 MM MISC Use 3x a day 300 each 3   metFORMIN (GLUCOPHAGE-XR) 500 MG 24 hr tablet Take 1 tablet (500 mg total) by mouth 2 (two) times daily with a meal. 60 tablet 6   olmesartan-hydrochlorothiazide (BENICAR HCT) 40-25 MG tablet Take 1 tablet by mouth daily. 90 tablet  3   ONETOUCH ULTRA test strip USE TO check blood glucose four times daily AS DIRECTED 150 strip 3   pantoprazole (PROTONIX) 40 MG tablet Take 1 tablet (40 mg total) by mouth daily. 90 tablet 3   simvastatin (ZOCOR) 20 MG tablet TAKE 1 TABLET BY MOUTH EVERYDAY AT BEDTIME 90 tablet 3   tadalafil (CIALIS) 20 MG tablet TAKE 1/2 TO 1 TABLET BY MOUTH every other DAY AS NEEDED 5 tablet 3   No facility-administered medications prior to visit.     Per HPI unless specifically indicated in ROS section below Review of Systems  Objective:  BP 134/66   Pulse 82   Temp 97.9 F (36.6 C) (Temporal)   Ht 5\' 10"  (1.778 m)   Wt 216 lb (98 kg)   SpO2 97%   BMI 30.99 kg/m   Wt Readings from Last 3 Encounters:  03/06/23 216  lb (98 kg)  01/23/23 214 lb 2 oz (97.1 kg)  12/15/22 221 lb 4 oz (100.4 kg)      Physical Exam Vitals and nursing note reviewed.  Constitutional:      Appearance: Normal appearance. He is not ill-appearing.  Eyes:     Extraocular Movements: Extraocular movements intact.     Conjunctiva/sclera: Conjunctivae normal.     Pupils: Pupils are equal, round, and reactive to light.  Neck:     Comments:  Limited ROM throughout due to R neck pain  No midline cervical spine pain  ++ pain to palpation along R paracervical mm No pain at trapezius mm Cardiovascular:     Rate and Rhythm: Normal rate and regular rhythm.     Pulses: Normal pulses.     Heart sounds: Normal heart sounds. No murmur heard. Pulmonary:     Effort: Pulmonary effort is normal. No respiratory distress.     Breath sounds: Normal breath sounds. No wheezing, rhonchi or rales.  Musculoskeletal:     Right lower leg: No edema.     Left lower leg: No edema.     Comments: See HPI for foot exam if done  Skin:    General: Skin is warm and dry.     Findings: No rash.  Neurological:     Mental Status: He is alert.  Psychiatric:        Mood and Affect: Mood normal.        Behavior: Behavior normal.       Results for orders placed or performed in visit on 12/15/22  POCT glycosylated hemoglobin (Hb A1C)  Result Value Ref Range   Hemoglobin A1C 7.7 (A) 4.0 - 5.6 %   HbA1c POC (<> result, manual entry)     HbA1c, POC (prediabetic range)     HbA1c, POC (controlled diabetic range)      Assessment & Plan:   Problem List Items Addressed This Visit     MCI (mild cognitive impairment) with memory loss    Continues aricept 5mg  nightly.       Type 2 diabetes mellitus with hypoglycemia ([name])    Chronic, brittle diabetes.  He is unsure if he took tresiba today - doesn't remember due to neck pain. I asked him to start logging type and dose of insulin each shot - on smart phone DexCom APP, I showed him where to document this.   RTC 4 wk DM f/u, will review readings at that time      Primary osteoarthritis of left knee   Primary osteoarthritis of right knee  Caregiver role strain    Notes wife's memory progressively deteriorating.  Hesitant to leave her home alone any due to this.  Broached subject of moving to ALF. He is hesitant. Discussed safety concerns especially as he also has memory difficulties.       Neck pain on right side - Primary    Story/exam most consistent with acute R sided neck strain with resultant torticollis. No red flags today. Rx toradol IM 30mg  in office, rec start heating pad at home, continue tylenol 1000mg  BID. Update if not improving with this, consider PM&R referral to consider further treatment.         Meds ordered this encounter  Medications   ketorolac (TORADOL) 30 MG/ML injection 30 mg    No orders of the defined types were placed in this encounter.   Patient Instructions  Start logging on your phone when you give yourself insulin shots - both novolog and tresiba, and how many units.  Return in 4 weeks for follow up visit to review #s.  For neck - take toradol shot 30mg  today.  Continue tylenol 1000mg  twice daily. Use heating pad to the neck, but covered in a towel. Let us know if not improving with this  Follow up plan: Return in about 1 month (around 04/06/2023) for follow up visit.  [name], MD

## 2023-03-06 NOTE — Telephone Encounter (Signed)
Spoke with pt about missing his 11:30 OV today. Pt says he forgot. States he is on his way now. Dr. Reece Agar says he will see pt.   Notified front office.

## 2023-03-06 NOTE — Assessment & Plan Note (Deleted)
Bilateral

## 2023-03-12 ENCOUNTER — Ambulatory Visit (INDEPENDENT_AMBULATORY_CARE_PROVIDER_SITE_OTHER): Payer: Medicare HMO | Admitting: Podiatry

## 2023-03-12 DIAGNOSIS — M2041 Other hammer toe(s) (acquired), right foot: Secondary | ICD-10-CM

## 2023-03-12 DIAGNOSIS — M2042 Other hammer toe(s) (acquired), left foot: Secondary | ICD-10-CM

## 2023-03-12 DIAGNOSIS — M2012 Hallux valgus (acquired), left foot: Secondary | ICD-10-CM

## 2023-03-12 DIAGNOSIS — L84 Corns and callosities: Secondary | ICD-10-CM

## 2023-03-12 DIAGNOSIS — E1142 Type 2 diabetes mellitus with diabetic polyneuropathy: Secondary | ICD-10-CM

## 2023-03-12 DIAGNOSIS — M2011 Hallux valgus (acquired), right foot: Secondary | ICD-10-CM

## 2023-03-12 NOTE — Progress Notes (Signed)
Patient presents today to be measured for diabetic shoes and insoles.  Patient was measured for  1 pair of diabetic shoes and 3 pairs of foam casted diabetic insoles. He already picked up his shoes for 2024 I advised him that if we put the order in now he would be responsible for paying for the shoes and inserts, he will come back in Jan 2025 and get new set ordered.   Shoe type he wants is [id number]  Wt 210  Ht 6'0

## 2023-03-13 ENCOUNTER — Encounter: Payer: Medicare HMO | Admitting: Pharmacist

## 2023-03-19 ENCOUNTER — Telehealth: Payer: Self-pay | Admitting: Family Medicine

## 2023-03-19 DIAGNOSIS — M542 Cervicalgia: Secondary | ICD-10-CM

## 2023-03-19 NOTE — Telephone Encounter (Signed)
Spoke with patient - ongoing neck pain x 2 wks, no significant improvement with tylenol. Requests PM&R referral

## 2023-03-26 ENCOUNTER — Other Ambulatory Visit: Payer: Self-pay | Admitting: Family Medicine

## 2023-03-26 ENCOUNTER — Telehealth: Payer: Self-pay | Admitting: Family Medicine

## 2023-03-26 DIAGNOSIS — G3184 Mild cognitive impairment, so stated: Secondary | ICD-10-CM

## 2023-03-26 NOTE — Telephone Encounter (Signed)
Pt called to let Dr. Reece Agar know that Dr. Carlus Pavlov will no longer be writing orders for his diabetic sensors. Pt states Dr. Elvera Lennox suggested Dr. Reece Agar should start giving the orders. Pt states he receives the sensors from [employer] stated the company will start reaching out to Dr. Reece Agar in the future for orders. Pt states he's had to borrow two sensors from someone close to him, due to being out. Call back # [phone]

## 2023-03-26 NOTE — Telephone Encounter (Signed)
Donepezil Last filled:  03/06/23, #30 Last OV:  03/06/23, 6 wk DM f/u Next OV:  04/09/23, 1 mo DM f/u

## 2023-03-27 NOTE — Telephone Encounter (Signed)
Noted  

## 2023-03-28 DIAGNOSIS — M5412 Radiculopathy, cervical region: Secondary | ICD-10-CM | POA: Diagnosis not present

## 2023-03-28 DIAGNOSIS — M5030 Other cervical disc degeneration, unspecified cervical region: Secondary | ICD-10-CM | POA: Diagnosis not present

## 2023-03-28 DIAGNOSIS — M47812 Spondylosis without myelopathy or radiculopathy, cervical region: Secondary | ICD-10-CM | POA: Diagnosis not present

## 2023-03-28 NOTE — Telephone Encounter (Signed)
ERx 

## 2023-04-09 ENCOUNTER — Telehealth: Payer: Self-pay

## 2023-04-09 ENCOUNTER — Ambulatory Visit: Payer: Self-pay

## 2023-04-09 ENCOUNTER — Ambulatory Visit: Payer: Medicare HMO | Admitting: Family Medicine

## 2023-04-09 DIAGNOSIS — E11649 Type 2 diabetes mellitus with hypoglycemia without coma: Secondary | ICD-10-CM

## 2023-04-09 NOTE — Telephone Encounter (Addendum)
Pt did not show for OV today at 11:30. Dr Reece Agar had me call pt due to him forgetting about appts.    Lvm asking pt to call back. Dr Brock Bad pt to still be seen today as long as he can be here before 1:00. However, pt will have to wait to be seen. If not, plz r/s 1 mth DM f/u in next 30 min slot.

## 2023-04-09 NOTE — Patient Outreach (Signed)
  Care Coordination   04/09/2023 Name: Anthony [name] MRN: [id number] DOB: 02/21/1946   Care Coordination Outreach Attempts:  An unsuccessful telephone outreach was attempted for a scheduled appointment today.  HIPAA compliant voice message left with call back phone number.   Follow Up Plan:  Additional outreach attempts will be made to offer the patient care coordination information and services.   Encounter Outcome:  No Answer   Care Coordination Interventions:  No, not indicated    George Ina [facility] Care Coordination [phone] direct line

## 2023-04-10 ENCOUNTER — Encounter: Payer: Self-pay | Admitting: Family Medicine

## 2023-04-10 DIAGNOSIS — Z794 Long term (current) use of insulin: Secondary | ICD-10-CM | POA: Diagnosis not present

## 2023-04-10 DIAGNOSIS — E113299 Type 2 diabetes mellitus with mild nonproliferative diabetic retinopathy without macular edema, unspecified eye: Secondary | ICD-10-CM | POA: Diagnosis not present

## 2023-04-10 NOTE — Telephone Encounter (Signed)
Spoke with pt notifying him of missed f/u on 04/09/23. Pt offers his apologies stating he forgot. We scheduled OV on 04/13/23 at 2:00. Informed pt I will call him that day to remind him of appt. Pt expresses his thanks and again offers his apologies.

## 2023-04-13 ENCOUNTER — Ambulatory Visit (INDEPENDENT_AMBULATORY_CARE_PROVIDER_SITE_OTHER): Payer: Medicare HMO | Admitting: Family Medicine

## 2023-04-13 ENCOUNTER — Encounter: Payer: Self-pay | Admitting: Family Medicine

## 2023-04-13 VITALS — BP 138/78 | HR 65 | Temp 97.8°F | Ht 70.0 in | Wt 218.4 lb

## 2023-04-13 DIAGNOSIS — E11649 Type 2 diabetes mellitus with hypoglycemia without coma: Secondary | ICD-10-CM

## 2023-04-13 DIAGNOSIS — G3184 Mild cognitive impairment, so stated: Secondary | ICD-10-CM

## 2023-04-13 DIAGNOSIS — M47812 Spondylosis without myelopathy or radiculopathy, cervical region: Secondary | ICD-10-CM | POA: Diagnosis not present

## 2023-04-13 DIAGNOSIS — Z794 Long term (current) use of insulin: Secondary | ICD-10-CM | POA: Diagnosis not present

## 2023-04-13 LAB — POCT GLYCOSYLATED HEMOGLOBIN (HGB A1C): Hemoglobin A1C: 7.4 % — AB (ref 4.0–5.6)

## 2023-04-13 NOTE — Patient Instructions (Addendum)
Continue documenting insulin dosing in your Dexcom G7 app - as shown today.  Bring your smart phone with insulin administration to next visit.  Return in 1 month for wellness visit/physical

## 2023-04-13 NOTE — Assessment & Plan Note (Addendum)
Chronic brittle diabetes.  APP log reviewed - he's not been documenting insulin dosing. Overall stable readings based on CGM readings - no significant hypoglycemia in the past 30 days!  RTC 1 month CPE/AMW reassess at that time.

## 2023-04-13 NOTE — Progress Notes (Signed)
Ph: [phone] Fax: [phone]   Patient ID: Anthony [name], male    DOB: 02-05-1946, 77 y.o.   MRN: [id number]  This visit was conducted in person.  BP 138/78   Pulse 65   Temp 97.8 F (36.6 C) (Temporal)   Ht 5\' 10"  (1.778 m)   Wt 218 lb 6 oz (99.1 kg)   SpO2 95%   BMI 31.33 kg/m    CC: DM f/u visit  Subjective:   HPI: Anthony [name] is a 77 y.o. male presenting on 04/13/2023 for Medical Management of Chronic Issues (Here for 1 mo DM f/u.)   Caregiver for wife with dementia - states wife frequently misplaces paperwork so he forgets upcoming appointments.   Cleotis Nipper NP with Dr Yves Dill last month for cervical neck pain - thought due to cervical spondylosis s/p xrays, PT, mobic and robaxin with plan f/u 4-6 wks and if ongoing to consider MRI. Neck is feeling significantly better.   DM - does regularly check sugars with CGM - 140s today. Compliant with antihyperglycemic regimen which includes: novolog flexpen 12-18u with meals per sliding scale (12u 100-200, 15u 200-300, 18u >300), metformin XR 500mg  bid, tresiba 32u daily. Denies low sugars or hypoglycemic symptoms. Denies paresthesias. Last diabetic eye exam 10/2022. Glucometer brand: Dexcom G7, onetouch. Last foot exam: 12/2022. DME - [facility]  Lab Results  Component Value Date   HGBA1C 7.4 (A) 04/13/2023   Diabetic Foot Exam - Simple   No data filed    Lab Results  Component Value Date   MICROALBUR 6.6 (H) 04/14/2022    CGM data: DexCom G7 30 day average sugar: 213, time in range 36%, hypoglycemia <1%, stage 1 hyperglycemia 27%, stage 2 hyperglycemia 36%.       Relevant past medical, surgical, family and social history reviewed and updated as indicated. Interim medical history since our last visit reviewed. Allergies and medications reviewed and updated. Outpatient Medications Prior to Visit  Medication Sig Dispense Refill   amLODipine (NORVASC) 5 MG tablet Take 1 tablet (5 mg total) by mouth  daily. For blood pressure 90 tablet 1   aspirin EC 81 MG tablet Take 1 tablet (81 mg total) by mouth daily. Swallow whole. 30 tablet 12   b complex vitamins tablet Take 1 tablet by mouth daily.     Continuous Blood Gluc Sensor (DEXCOM G7 SENSOR) MISC by Does not apply route.     Dextrose, Diabetic Use, (GLUCOSE PO) Take 1 tablet by mouth as needed.     donepezil (ARICEPT) 5 MG tablet TAKE ONE TABLET BY MOUTH EVERYDAY AT BEDTIME 90 tablet 2   GVOKE HYPOPEN 1-PACK 1 MG/0.2ML SOAJ Inject under skin 0.2 mL as needed for hypoglycemia 0.2 mL 11   insulin aspart (NOVOLOG FLEXPEN) 100 UNIT/ML FlexPen Inject 12-18 Units into the skin 3 (three) times daily before meals. Per sliding scale: 12 units 100-200, 15 units 200-300, 18 units >300 cbg 60 mL 1   insulin degludec (TRESIBA FLEXTOUCH) 200 UNIT/ML FlexTouch Pen inject 32 units into THE SKIN DAILY 27 mL 2   Insulin Pen Needle (ADVOCATE INSULIN PEN NEEDLES) 31G X 5 MM MISC Use 3x a day 300 each 3   meloxicam (MOBIC) 15 MG tablet Take by mouth.     metFORMIN (GLUCOPHAGE-XR) 500 MG 24 hr tablet Take 1 tablet (500 mg total) by mouth 2 (two) times daily with a meal. 60 tablet 6   methocarbamol (ROBAXIN) 500 MG tablet 1/2-1 po qHS prn  olmesartan-hydrochlorothiazide (BENICAR HCT) 40-25 MG tablet Take 1 tablet by mouth daily. 90 tablet 3   ONETOUCH ULTRA test strip USE TO check blood glucose four times daily AS DIRECTED 150 strip 3   pantoprazole (PROTONIX) 40 MG tablet Take 1 tablet (40 mg total) by mouth daily. 90 tablet 3   simvastatin (ZOCOR) 20 MG tablet TAKE 1 TABLET BY MOUTH EVERYDAY AT BEDTIME 90 tablet 3   tadalafil (CIALIS) 20 MG tablet TAKE 1/2 TO 1 TABLET BY MOUTH every other DAY AS NEEDED 5 tablet 3   No facility-administered medications prior to visit.     Per HPI unless specifically indicated in ROS section below Review of Systems  Objective:  BP 138/78   Pulse 65   Temp 97.8 F (36.6 C) (Temporal)   Ht 5\' 10"  (1.778 m)   Wt 218 lb 6  oz (99.1 kg)   SpO2 95%   BMI 31.33 kg/m   Wt Readings from Last 3 Encounters:  04/13/23 218 lb 6 oz (99.1 kg)  03/06/23 216 lb (98 kg)  01/23/23 214 lb 2 oz (97.1 kg)      Physical Exam Vitals and nursing note reviewed.  Constitutional:      Appearance: Normal appearance. He is not ill-appearing.  HENT:     Mouth/Throat:     Mouth: Mucous membranes are moist.     Pharynx: Oropharynx is clear. No oropharyngeal exudate or posterior oropharyngeal erythema.  Eyes:     Extraocular Movements: Extraocular movements intact.     Pupils: Pupils are equal, round, and reactive to light.  Cardiovascular:     Rate and Rhythm: Normal rate and regular rhythm.     Pulses: Normal pulses.     Heart sounds: Normal heart sounds. No murmur heard. Pulmonary:     Effort: Pulmonary effort is normal. No respiratory distress.     Breath sounds: Normal breath sounds. No wheezing, rhonchi or rales.  Skin:    General: Skin is warm and dry.     Findings: No rash.  Neurological:     Mental Status: He is alert.  Psychiatric:        Mood and Affect: Mood normal.        Behavior: Behavior normal.       Results for orders placed or performed in visit on 04/13/23  POCT glycosylated hemoglobin (Hb A1C)  Result Value Ref Range   Hemoglobin A1C 7.4 (A) 4.0 - 5.6 %   HbA1c POC (<> result, manual entry)     HbA1c, POC (prediabetic range)     HbA1c, POC (controlled diabetic range)      Assessment & Plan:   Problem List Items Addressed This Visit     MCI (mild cognitive impairment) with memory loss   Type 2 diabetes mellitus with hypoglycemia ([name]) - Primary    Chronic brittle diabetes.  APP log reviewed - he's not been documenting insulin dosing. Overall stable readings based on CGM readings - no significant hypoglycemia in the past 30 days!  RTC 1 month CPE/AMW reassess at that time.       Relevant Orders   POCT glycosylated hemoglobin (Hb A1C) (Completed)   Cervical spondylosis    Appreciate  PM&R care s/p meloxicam and robaxin course with benefit. Was also referred to PT. I'm unable to review cervical films obtained at PM&R office. Has f/u planned 04/2023 - reviewed date and time of upcoming appointment.       Relevant Medications   meloxicam (MOBIC) 15  MG tablet   methocarbamol (ROBAXIN) 500 MG tablet     No orders of the defined types were placed in this encounter.   Orders Placed This Encounter  Procedures   POCT glycosylated hemoglobin (Hb A1C)    Patient Instructions  Continue documenting insulin dosing in your Dexcom G7 app - as shown today.  Bring your smart phone with insulin administration to next visit.  Return in 1 month for wellness visit/physical   Follow up plan: Return in about 1 month (around 05/14/2023) for annual exam, prior fasting for blood work, medicare wellness visit.  [name], MD

## 2023-04-13 NOTE — Assessment & Plan Note (Signed)
Appreciate PM&R care s/p meloxicam and robaxin course with benefit. Was also referred to PT. I'm unable to review cervical films obtained at PM&R office. Has f/u planned 04/2023 - reviewed date and time of upcoming appointment.

## 2023-04-18 ENCOUNTER — Other Ambulatory Visit (HOSPITAL_COMMUNITY): Payer: Self-pay

## 2023-04-18 ENCOUNTER — Other Ambulatory Visit: Payer: Self-pay | Admitting: Family Medicine

## 2023-04-18 DIAGNOSIS — G3184 Mild cognitive impairment, so stated: Secondary | ICD-10-CM

## 2023-04-18 DIAGNOSIS — N529 Male erectile dysfunction, unspecified: Secondary | ICD-10-CM

## 2023-04-18 NOTE — Telephone Encounter (Signed)
Pt called in stated was unable to get in touch with [facility] - [place], [place]  to get his medication transfer to [facility] stated PCP will have to call them in . Please advise [phone]

## 2023-04-18 NOTE — Telephone Encounter (Signed)
Dr. Reece Agar I have pended all medications that I saw you were the prescribing provider. Please review.  I have also changed the patient's pharmacy to [facility]

## 2023-04-18 NOTE — Telephone Encounter (Signed)
Called the [facility] to see what is needed for patient to get medications changed. Spoke with Judie Grieve and he informed me that the prescribing physician will need to sen new prescriptions to whatever [facility] pharmacy the patient is requesting.

## 2023-04-20 ENCOUNTER — Other Ambulatory Visit: Payer: Self-pay | Admitting: Family Medicine

## 2023-04-20 MED ORDER — OLMESARTAN MEDOXOMIL-HCTZ 40-25 MG PO TABS
1.0000 | ORAL_TABLET | Freq: Every day | ORAL | 2 refills | Status: DC
  Filled 2023-04-20 – 2023-06-18 (×2): qty 90, 90d supply, fill #0
  Filled 2023-09-21: qty 90, 90d supply, fill #1

## 2023-04-20 MED ORDER — AMLODIPINE BESYLATE 5 MG PO TABS
5.0000 mg | ORAL_TABLET | Freq: Every day | ORAL | 2 refills | Status: DC
  Filled 2023-04-20 – 2023-06-18 (×2): qty 90, 90d supply, fill #0

## 2023-04-20 MED ORDER — METFORMIN HCL ER 500 MG PO TB24
500.0000 mg | ORAL_TABLET | Freq: Two times a day (BID) | ORAL | 2 refills | Status: DC
  Filled 2023-04-20: qty 180, 90d supply, fill #0

## 2023-04-20 MED ORDER — TADALAFIL 20 MG PO TABS
10.0000 mg | ORAL_TABLET | ORAL | 3 refills | Status: DC | PRN
Start: 2023-04-20 — End: 2024-03-19
  Filled 2023-04-20: qty 5, fill #0
  Filled 2023-06-18: qty 5, 10d supply, fill #0

## 2023-04-20 MED ORDER — SIMVASTATIN 20 MG PO TABS
20.0000 mg | ORAL_TABLET | Freq: Every day | ORAL | 2 refills | Status: DC
  Filled 2023-04-20 – 2023-06-18 (×2): qty 90, 90d supply, fill #0
  Filled 2023-09-21: qty 90, 90d supply, fill #1

## 2023-04-20 MED ORDER — NOVOLOG FLEXPEN 100 UNIT/ML ~~LOC~~ SOPN
12.0000 [IU] | PEN_INJECTOR | Freq: Three times a day (TID) | SUBCUTANEOUS | 1 refills | Status: DC
  Filled 2023-04-20: qty 48, 88d supply, fill #0

## 2023-04-20 MED ORDER — PANTOPRAZOLE SODIUM 40 MG PO TBEC
40.0000 mg | DELAYED_RELEASE_TABLET | Freq: Every day | ORAL | 2 refills | Status: DC
  Filled 2023-04-20 – 2023-06-18 (×2): qty 90, 90d supply, fill #0
  Filled 2023-09-21: qty 90, 90d supply, fill #1

## 2023-04-20 MED ORDER — DONEPEZIL HCL 5 MG PO TABS
5.0000 mg | ORAL_TABLET | Freq: Every day | ORAL | 2 refills | Status: DC
  Filled 2023-04-20 – 2023-06-18 (×2): qty 90, 90d supply, fill #0

## 2023-04-20 NOTE — Telephone Encounter (Signed)
Called patient to ask if he had changed his mind about using the [facility] like he request 2 days ago. Patient stated that he is unaware of this pharmacy [facility] in [place], [place]. Patient wishes to use [facility] as previously requested.

## 2023-04-20 NOTE — Telephone Encounter (Signed)
ERx 

## 2023-04-21 ENCOUNTER — Other Ambulatory Visit (HOSPITAL_COMMUNITY): Payer: Self-pay

## 2023-04-23 ENCOUNTER — Other Ambulatory Visit (HOSPITAL_COMMUNITY): Payer: Self-pay

## 2023-04-26 ENCOUNTER — Other Ambulatory Visit: Payer: Self-pay | Admitting: Family Medicine

## 2023-04-27 NOTE — Telephone Encounter (Signed)
[facility] closing. New rxs sent on 04/20/23, #90/2 to [facility] pharmacy.  Requests denied.

## 2023-05-03 ENCOUNTER — Telehealth: Payer: Self-pay | Admitting: *Deleted

## 2023-05-03 NOTE — Progress Notes (Signed)
  Care Coordination Note  05/03/2023 Name: TAURIAN LEY MRN: [id number] DOB: 1946/06/20  SARITH WESTLY is a 77 y.o. year old male who is a primary care patient of [name], MD and is actively engaged with the care management team. I reached out to [name] by phone today to assist with re-scheduling a follow up visit with the RN Case Manager  Follow up plan: We have been unable to make contact with the patient for follow up.    Burman Nieves, CCMA Care Coordination Care Guide Direct Dial: [phone]

## 2023-05-07 ENCOUNTER — Other Ambulatory Visit: Payer: Self-pay

## 2023-05-07 ENCOUNTER — Encounter: Payer: Self-pay | Admitting: Family Medicine

## 2023-05-07 ENCOUNTER — Ambulatory Visit (INDEPENDENT_AMBULATORY_CARE_PROVIDER_SITE_OTHER): Payer: Medicare HMO | Admitting: Family Medicine

## 2023-05-07 VITALS — BP 138/78 | HR 73 | Temp 97.4°F | Ht 69.75 in | Wt 221.1 lb

## 2023-05-07 DIAGNOSIS — N401 Enlarged prostate with lower urinary tract symptoms: Secondary | ICD-10-CM

## 2023-05-07 DIAGNOSIS — Z0000 Encounter for general adult medical examination without abnormal findings: Secondary | ICD-10-CM

## 2023-05-07 DIAGNOSIS — K219 Gastro-esophageal reflux disease without esophagitis: Secondary | ICD-10-CM

## 2023-05-07 DIAGNOSIS — I10 Essential (primary) hypertension: Secondary | ICD-10-CM | POA: Diagnosis not present

## 2023-05-07 DIAGNOSIS — G3184 Mild cognitive impairment, so stated: Secondary | ICD-10-CM | POA: Diagnosis not present

## 2023-05-07 DIAGNOSIS — N138 Other obstructive and reflux uropathy: Secondary | ICD-10-CM

## 2023-05-07 DIAGNOSIS — Z638 Other specified problems related to primary support group: Secondary | ICD-10-CM | POA: Diagnosis not present

## 2023-05-07 DIAGNOSIS — I700 Atherosclerosis of aorta: Secondary | ICD-10-CM

## 2023-05-07 DIAGNOSIS — E1169 Type 2 diabetes mellitus with other specified complication: Secondary | ICD-10-CM

## 2023-05-07 DIAGNOSIS — Z794 Long term (current) use of insulin: Secondary | ICD-10-CM

## 2023-05-07 DIAGNOSIS — Z7189 Other specified counseling: Secondary | ICD-10-CM

## 2023-05-07 DIAGNOSIS — E11649 Type 2 diabetes mellitus with hypoglycemia without coma: Secondary | ICD-10-CM

## 2023-05-07 DIAGNOSIS — E785 Hyperlipidemia, unspecified: Secondary | ICD-10-CM

## 2023-05-07 DIAGNOSIS — Z23 Encounter for immunization: Secondary | ICD-10-CM | POA: Diagnosis not present

## 2023-05-07 DIAGNOSIS — Z1211 Encounter for screening for malignant neoplasm of colon: Secondary | ICD-10-CM

## 2023-05-07 LAB — COMPREHENSIVE METABOLIC PANEL
ALT: 14 U/L (ref 0–53)
AST: 13 U/L (ref 0–37)
Albumin: 3.8 g/dL (ref 3.5–5.2)
Alkaline Phosphatase: 111 U/L (ref 39–117)
BUN: 18 mg/dL (ref 6–23)
CO2: 32 meq/L (ref 19–32)
Calcium: 9 mg/dL (ref 8.4–10.5)
Chloride: 102 meq/L (ref 96–112)
Creatinine, Ser: 0.91 mg/dL (ref 0.40–1.50)
GFR: 82.8 mL/min (ref >60.00)
Glucose, Bld: 272 mg/dL — ABNORMAL HIGH (ref 70–99)
Potassium: 4.1 meq/L (ref 3.5–5.1)
Sodium: 142 meq/L (ref 135–145)
Total Bilirubin: 0.7 mg/dL (ref 0.2–1.2)
Total Protein: 6.1 g/dL (ref 6.0–8.3)

## 2023-05-07 LAB — LIPID PANEL
Cholesterol: 131 mg/dL (ref 0–200)
HDL: 43.5 mg/dL (ref >39.00)
LDL Cholesterol: 67 mg/dL (ref 0–99)
NonHDL: 87.8
Total CHOL/HDL Ratio: 3
Triglycerides: 102 mg/dL (ref 0.0–149.0)
VLDL: 20.4 mg/dL (ref 0.0–40.0)

## 2023-05-07 LAB — PSA: PSA: 3.74 ng/mL (ref 0.10–4.00)

## 2023-05-07 NOTE — Patient Instructions (Addendum)
Flu shot today  Labs today  Pass by lab to pick up stool kit.  I will ask someone to reach out about further assistance at home.  Consider RSV shot at local pharmacy.  Bring [place] a copy of your living will.  Return end of October for diabetes follow up visit- bring all your medicines to that visit.

## 2023-05-07 NOTE — Progress Notes (Unsigned)
Ph: [phone] Fax: [phone]   Patient ID: Anthony [name], male    DOB: 03-24-1946, 77 y.o.   MRN: [id number]  This visit was conducted in person.  BP 138/78   Pulse 73   Temp (!) 97.4 F (36.3 C) (Temporal)   Ht 5' 9.75" (1.772 m)   Wt 221 lb 2 oz (100.3 kg)   SpO2 95%   BMI 31.96 kg/m    CC: AMW/CPE Subjective:   HPI: Anthony [name] is a 76 y.o. male presenting on 05/07/2023 for Medicare Wellness   Did not see health advisor this year.   Hearing Screening   500Hz  1000Hz  2000Hz  4000Hz   Right ear 25 25 40 40  Left ear 25 25 40 0  Vision Screening - Comments:: Last eye exam, 10/2022.  Flowsheet Row Office Visit from 03/06/2023 in [facility] [place]  PHQ-2 Total Score 0          05/07/2023   12:51 PM 01/23/2023   12:33 PM 12/15/2022   11:56 AM 09/15/2022   11:19 AM 04/13/2022    2:09 PM  Fall Risk   Falls in the past year? 0 0 0 0 0  Number falls in past yr:     0  Injury with Fall?     0  Risk for fall due to :     Medication side effect  Follow up     Falls evaluation completed;Education provided;Falls prevention discussed   DM - has been more regular with recording insulin - phone readings reviewed.  CGM data: Dexcom G7 30 day average sugar: 210, time in range 38%, hypoglycemia <1%, stage 1 hyperglycemia 32%, stage 2 hyperglycemia 29%.    Preventative: COLONOSCOPY 01/2018 - TAx2, diverticulosis, int hemorrhoids, rpt 5 yrs ([name]). H/o Barrett's continues daily PPI - due for repeat - will start with iFOB.  Prostate cancer screening - s/p TURP for BPH. Released from uro care. Normal voiding, will check PSA yearly  Lung cancer screening CT - ~25PY hx over lifetime. Latest lung CT 06/2022 reviewed - rec continue yearly lung cancer screen.  Flu shot yearly  COVID vaccine Pfizer 10/2019 x2, booster x1 [facility]) Pneumovax 06/2011, 2018. Prevnar-13 05/2013 Tetanus - 05/2013 RSV - discussed, to consider   zostavax - 07/2014 Shingrix - 04/2017, 12/2017 Advanced planning - just completed living will. Asked to bring [place] a copy. Son Tsering Mardirossian Innis and daughter Tyhir Rodda would be HCPOA. Would want to be resuscitated.  Seat belt use discussed.  Sunscreen use discussed. No changing moles on skin.  Ex smoker quit 2013, 1/2 ppd for about 50 yrs  Alcohol - 1-2 mixed drink per night  Dentist - due - doesn't have dental  Eye exam - yearly at [facility], has had cataract surgery  Bowel - no constipation  Bladder - no incontinence   Owner/operater of dental lab-sold. Fully retired. Married 1969 2 daughters 1972, 91; 1 son 92, 7 grandchildren Edu: 10th Grade Hobby: car restoration: has '66 Vette, two classic Chevelle SS's and is restoring a '37 chevy coup Activity: no regular exercise Diet: good water, fruits/vegetables daily     Relevant past medical, surgical, family and social history reviewed and updated as indicated. Interim medical history since our last visit reviewed. Allergies and medications reviewed and updated. Outpatient Medications Prior to Visit  Medication Sig Dispense Refill   amLODipine (NORVASC) 5 MG tablet Take 1 tablet (5 mg total) by mouth daily. For blood  pressure 90 tablet 2   aspirin EC 81 MG tablet Take 1 tablet (81 mg total) by mouth daily. Swallow whole. 30 tablet 12   b complex vitamins tablet Take 1 tablet by mouth daily.     Continuous Blood Gluc Sensor (DEXCOM G7 SENSOR) MISC by Does not apply route.     Dextrose, Diabetic Use, (GLUCOSE PO) Take 1 tablet by mouth as needed.     donepezil (ARICEPT) 5 MG tablet Take 1 tablet (5 mg total) by mouth at bedtime. 90 tablet 2   GVOKE HYPOPEN 1-PACK 1 MG/0.2ML SOAJ Inject under skin 0.2 mL as needed for hypoglycemia 0.2 mL 11   insulin aspart (NOVOLOG FLEXPEN) 100 UNIT/ML FlexPen Inject 12-18 Units into the skin 3 (three) times daily before meals. Per sliding scale: 12 units 100-200, 15 units 200-300, 18 units  >300 cbg 60 mL 1   insulin degludec (TRESIBA FLEXTOUCH) 200 UNIT/ML FlexTouch Pen inject 32 units into THE SKIN DAILY 27 mL 2   Insulin Pen Needle (ADVOCATE INSULIN PEN NEEDLES) 31G X 5 MM MISC Use 3x a day 300 each 3   meloxicam (MOBIC) 15 MG tablet Take by mouth.     metFORMIN (GLUCOPHAGE-XR) 500 MG 24 hr tablet Take 1 tablet (500 mg total) by mouth 2 (two) times daily with a meal. 180 tablet 2   methocarbamol (ROBAXIN) 500 MG tablet 1/2-1 po qHS prn     olmesartan-hydrochlorothiazide (BENICAR HCT) 40-25 MG tablet Take 1 tablet by mouth daily. 90 tablet 2   ONETOUCH ULTRA test strip USE TO check blood glucose four times daily AS DIRECTED 150 strip 3   pantoprazole (PROTONIX) 40 MG tablet Take 1 tablet (40 mg total) by mouth daily. 90 tablet 2   simvastatin (ZOCOR) 20 MG tablet Take 1 tablet (20 mg total) by mouth at bedtime. 90 tablet 2   tadalafil (CIALIS) 20 MG tablet Take 0.5-1 tablets (10-20 mg total) by mouth every other day as needed. 5 tablet 3   No facility-administered medications prior to visit.     Per HPI unless specifically indicated in ROS section below Review of Systems  Constitutional:  Negative for activity change, appetite change, chills, fatigue, fever and unexpected weight change.  HENT:  Negative for hearing loss.   Eyes:  Negative for visual disturbance.  Respiratory:  Positive for shortness of breath (occ). Negative for cough, chest tightness and wheezing.   Cardiovascular:  Negative for chest pain, palpitations and leg swelling.  Gastrointestinal:  Negative for abdominal distention, abdominal pain, blood in stool, constipation, diarrhea, nausea and vomiting.  Genitourinary:  Negative for difficulty urinating and hematuria.  Musculoskeletal:  Negative for arthralgias, myalgias and neck pain.  Skin:  Negative for rash.  Neurological:  Negative for dizziness, seizures, syncope and headaches.  Hematological:  Negative for adenopathy. Does not bruise/bleed easily.   Psychiatric/Behavioral:  Positive for dysphoric mood. The patient is nervous/anxious.     Objective:  BP 138/78   Pulse 73   Temp (!) 97.4 F (36.3 C) (Temporal)   Ht 5' 9.75" (1.772 m)   Wt 221 lb 2 oz (100.3 kg)   SpO2 95%   BMI 31.96 kg/m   Wt Readings from Last 3 Encounters:  05/07/23 221 lb 2 oz (100.3 kg)  04/13/23 218 lb 6 oz (99.1 kg)  03/06/23 216 lb (98 kg)      Physical Exam Vitals and nursing note reviewed.  Constitutional:      General: He is not in acute distress.  Appearance: Normal appearance. He is well-developed. He is not ill-appearing.  HENT:     Head: Normocephalic and atraumatic.     Right Ear: Hearing, tympanic membrane, ear canal and external ear normal.     Left Ear: Hearing, tympanic membrane, ear canal and external ear normal.     Mouth/Throat:     Mouth: Mucous membranes are moist.     Pharynx: Oropharynx is clear. No oropharyngeal exudate or posterior oropharyngeal erythema.  Eyes:     General: No scleral icterus.    Extraocular Movements: Extraocular movements intact.     Conjunctiva/sclera: Conjunctivae normal.     Pupils: Pupils are equal, round, and reactive to light.  Neck:     Thyroid: No thyroid mass or thyromegaly.     Vascular: No carotid bruit.  Cardiovascular:     Rate and Rhythm: Normal rate and regular rhythm.     Pulses: Normal pulses.          Radial pulses are 2+ on the right side and 2+ on the left side.     Heart sounds: Normal heart sounds. No murmur heard. Pulmonary:     Effort: Pulmonary effort is normal. No respiratory distress.     Breath sounds: Normal breath sounds. No wheezing, rhonchi or rales.  Abdominal:     General: Bowel sounds are normal. There is no distension.     Palpations: Abdomen is soft. There is no mass.     Tenderness: There is no abdominal tenderness. There is no guarding or rebound.     Hernia: No hernia is present.  Musculoskeletal:        General: Normal range of motion.     Cervical  back: Normal range of motion and neck supple.     Right lower leg: No edema.     Left lower leg: No edema.  Lymphadenopathy:     Cervical: No cervical adenopathy.  Skin:    General: Skin is warm and dry.     Findings: No rash.  Neurological:     General: No focal deficit present.     Mental Status: He is alert and oriented to person, place, and time.     Comments:  Recall 1/3, 2/3 with cue Calculation 3/5 DLORW  Psychiatric:        Mood and Affect: Mood normal.        Behavior: Behavior normal.        Thought Content: Thought content normal.        Judgment: Judgment normal.       Results for orders placed or performed in visit on 05/07/23  Lipid panel  Result Value Ref Range   Cholesterol 131 0 - 200 mg/dL   Triglycerides 161.0 0.0 - 149.0 mg/dL   HDL 96.04 >54.09 mg/dL   VLDL 81.1 0.0 - 91.4 mg/dL   LDL Cholesterol 67 0 - 99 mg/dL   Total CHOL/HDL Ratio 3    NonHDL 87.80   Comprehensive metabolic panel  Result Value Ref Range   Sodium 142 135 - 145 mEq/L   Potassium 4.1 3.5 - 5.1 mEq/L   Chloride 102 96 - 112 mEq/L   CO2 32 19 - 32 mEq/L   Glucose, Bld 272 (H) 70 - 99 mg/dL   BUN 18 6 - 23 mg/dL   Creatinine, Ser 7.82 0.40 - 1.50 mg/dL   Total Bilirubin 0.7 0.2 - 1.2 mg/dL   Alkaline Phosphatase 111 39 - 117 U/L   AST 13 0 -  37 U/L   ALT 14 0 - 53 U/L   Total Protein 6.1 6.0 - 8.3 g/dL   Albumin 3.8 3.5 - 5.2 g/dL   GFR 11.91 >47.82 mL/min   Calcium 9.0 8.4 - 10.5 mg/dL  PSA  Result Value Ref Range   PSA 3.74 0.10 - 4.00 ng/mL   Lab Results  Component Value Date   HGBA1C 7.4 (A) 04/13/2023     Assessment & Plan:   Problem List Items Addressed This Visit     Medicare annual wellness visit, subsequent - Primary (Chronic)    I have personally reviewed the Medicare Annual Wellness questionnaire and have noted 1. The patient's medical and social history 2. Their use of alcohol, tobacco or illicit drugs 3. Their current medications and supplements 4. The  patient's functional ability including ADL's, fall risks, home safety risks and hearing or visual impairment. Cognitive function has been assessed and addressed as indicated.  5. Diet and physical activity 6. Evidence for depression or mood disorders The patients weight, height, BMI have been recorded in the chart. I have made referrals, counseling and provided education to the patient based on review of the above and I have provided the pt with a written personalized care plan for preventive services. Provider list updated.. See scanned questionairre as needed for further documentation. Reviewed preventative protocols and updated unless pt declined.       Advanced care planning/counseling discussion (Chronic)    Advanced planning - thinks has living will at home but unsure. Asked to bring [place] a copy. Son Tejveer Mota Schum and daughter Baylen Allums would be HCPOA. Would want to be resuscitated. He needs to review what he has at home, ensure up to date.       Health maintenance examination (Chronic)    Preventative protocols reviewed and updated unless pt declined. Discussed healthy diet and lifestyle.       Hyperlipidemia associated with type 2 diabetes mellitus ([name])    Update FLP on simvastatin.  The 10-year ASCVD risk score (Arnett DK, et al., 2019) is: 46%   Values used to calculate the score:     Age: 31 years     Sex: Male     Is Non-Hispanic African American: No     Diabetic: Yes     Tobacco smoker: No     Systolic Blood Pressure: 138 mmHg     Is BP treated: Yes     HDL Cholesterol: 43.5 mg/dL     Total Cholesterol: 131 mg/dL       Relevant Orders   Lipid panel (Completed)   Comprehensive metabolic panel (Completed)   Hypertension    Chronic, stable on current regimen-continue this.      GERD    Continue pantoprazole daily.      Benign prostatic hyperplasia with urinary obstruction    S/p TURP. No longer sees urology.  Update PSA.       Relevant Orders    PSA (Completed)   Aortic atherosclerosis ([name])    Continue statin, aspirin.      MCI (mild cognitive impairment) with memory loss    Chronic, overall stable period on aricept 5mg  nightly.  Concern for increased level of care need caring for his wife with progressive dementia. He agrees to palliative care eval for himself and wife.       Relevant Orders   Amb Referral to Palliative Care   Type 2 diabetes mellitus with hypoglycemia ([name])    Chronic brittle diabetes  released from endo care due to difficulty with med regimen adherence. Latest A1c 7.4%.       Relevant Orders   Fructosamine   Microalbumin / creatinine urine ratio   Amb Referral to Palliative Care   Caregiver role strain    See above-notes wife's memory progressively deteriorating.  Hesitant to leave her home alone.  He also has memory difficulty.  I again discussed option of moving to ALF.  I will ask palliative care services to reach out to patient and his wife for level of care need assessment.      Relevant Orders   Amb Referral to Palliative Care   Other Visit Diagnoses     Encounter for immunization       Relevant Orders   Flu Vaccine Trivalent High Dose (Fluad) (Completed)   Special screening for malignant neoplasms, colon       Relevant Orders   Fecal occult blood, imunochemical        No orders of the defined types were placed in this encounter.   Orders Placed This Encounter  Procedures   Fecal occult blood, imunochemical    Standing Status:   Future    Standing Expiration Date:   05/06/2024   Flu Vaccine Trivalent High Dose (Fluad)   Lipid panel   Comprehensive metabolic panel   PSA   Fructosamine   Microalbumin / creatinine urine ratio    Standing Status:   Future    Number of Occurrences:   1    Standing Expiration Date:   05/06/2024   Amb Referral to Palliative Care    Referral Priority:   Routine    Referral Type:   Consultation    Number of Visits Requested:   1    Patient  Instructions  Flu shot today  Labs today  Pass by lab to pick up stool kit.  I will ask someone to reach out about further assistance at home.  Consider RSV shot at local pharmacy.  Bring [place] a copy of your living will.  Return end of October for diabetes follow up visit- bring all your medicines to that visit.   Follow up plan: Return in about 3 months (around 08/06/2023) for follow up visit.  [name], MD

## 2023-05-07 NOTE — Assessment & Plan Note (Signed)

## 2023-05-07 NOTE — Assessment & Plan Note (Signed)
Advanced planning - thinks has living will at home but unsure. Asked to bring [place] a copy. Son Guerino Marczak Zeiser and daughter Tan Chretien would be HCPOA. Would want to be resuscitated. He needs to review what he has at home, ensure up to date.

## 2023-05-07 NOTE — Assessment & Plan Note (Signed)
Preventative protocols reviewed and updated unless pt declined. Discussed healthy diet and lifestyle.  

## 2023-05-08 ENCOUNTER — Encounter: Payer: Self-pay | Admitting: Family Medicine

## 2023-05-08 LAB — MICROALBUMIN / CREATININE URINE RATIO
Creatinine,U: 99.5 mg/dL
Microalb Creat Ratio: 8.6 mg/g (ref 0.0–30.0)
Microalb, Ur: 8.5 mg/dL — ABNORMAL HIGH (ref 0.0–1.9)

## 2023-05-08 NOTE — Assessment & Plan Note (Addendum)
Chronic, overall stable period on aricept 5mg  nightly.  Concern for increased level of care need caring for his wife with progressive dementia. He agrees to palliative care eval for himself and wife.

## 2023-05-08 NOTE — Assessment & Plan Note (Signed)
S/p TURP. No longer sees urology.  Update PSA.

## 2023-05-08 NOTE — Assessment & Plan Note (Signed)
Continue pantoprazole daily.  

## 2023-05-08 NOTE — Assessment & Plan Note (Signed)
See above-notes wife's memory progressively deteriorating.  Hesitant to leave her home alone.  He also has memory difficulty.  I again discussed option of moving to ALF.  I will ask palliative care services to reach out to patient and his wife for level of care need assessment.

## 2023-05-08 NOTE — Assessment & Plan Note (Signed)
Chronic, stable on current regimen - continue this.  

## 2023-05-08 NOTE — Assessment & Plan Note (Signed)
Chronic brittle diabetes released from endo care due to difficulty with med regimen adherence. Latest A1c 7.4%.

## 2023-05-08 NOTE — Assessment & Plan Note (Signed)
Continue statin, aspirin 

## 2023-05-08 NOTE — Assessment & Plan Note (Signed)
Update FLP on simvastatin.  The 10-year ASCVD risk score (Arnett DK, et al., 2019) is: 46%   Values used to calculate the score:     Age: 77 years     Sex: Male     Is Non-Hispanic African American: No     Diabetic: Yes     Tobacco smoker: No     Systolic Blood Pressure: 138 mmHg     Is BP treated: Yes     HDL Cholesterol: 43.5 mg/dL     Total Cholesterol: 131 mg/dL

## 2023-05-10 LAB — FRUCTOSAMINE: Fructosamine: 321 umol/L — ABNORMAL HIGH (ref 205–285)

## 2023-05-14 ENCOUNTER — Encounter: Payer: Self-pay | Admitting: Podiatry

## 2023-05-14 ENCOUNTER — Ambulatory Visit: Payer: Medicare HMO | Admitting: Podiatry

## 2023-05-14 DIAGNOSIS — M79675 Pain in left toe(s): Secondary | ICD-10-CM

## 2023-05-14 DIAGNOSIS — E1142 Type 2 diabetes mellitus with diabetic polyneuropathy: Secondary | ICD-10-CM | POA: Diagnosis not present

## 2023-05-14 DIAGNOSIS — B351 Tinea unguium: Secondary | ICD-10-CM

## 2023-05-14 DIAGNOSIS — M79674 Pain in right toe(s): Secondary | ICD-10-CM | POA: Diagnosis not present

## 2023-05-14 NOTE — Progress Notes (Signed)
Subjective:  Patient ID: Anthony [name], male    DOB: 08/22/1946,   MRN: [id number]  Chief Complaint  Patient presents with   Nail Problem    Pt presents for a dfc.    77 y.o. male presents for concern of thickened elongated and painful nails that are difficult to trim. Requesting to have them trimmed today. Relates burning and tingling in their feet. Patient is diabetic and last A1c was  Lab Results  Component Value Date   HGBA1C 7.4 (A) 04/13/2023   .   PCP:  Anthony Boyden, MD    . Denies any other pedal complaints. Denies n/v/f/c.   Past Medical History:  Diagnosis Date   Aortic atherosclerosis ([name]) 02/28/2020   By CT   Barrett's esophagus 12/08/2015   By EGD 2010 Anthony [name])    BPH with obstruction/lower urinary tract symptoms 2014   s/p TURP   COPD (chronic obstructive pulmonary disease) ([name]) 02/28/2020   By CT: diffuse bronchial wall thickening with mild centrilobular and paraseptal emphysema (01/2020)   Coronary artery calcification seen on CAT scan 02/28/2020   3v CAD - calcified atherosclerotic plaque in the left main, left anterior descending and left circumflex coronary arteries (01/2020)   Diabetes mellitus type 2 with complications ([name])    completed DMSE   DKA (diabetic ketoacidoses) 01/2010   "in coma"   Ex-smoker 01/21/2020   GERD (gastroesophageal reflux disease)    HTN (hypertension)    Hyperlipidemia    controlled with medicine   Osteoarthritis    Rheumatic fever    maybe as child    Objective:  Physical Exam: Vascular: DP/PT pulses 2/4 bilateral. CFT <3 seconds. Normal hair growth on digits. No edema.  Skin. No lacerations or abrasions bilateral feet. Hyperkeratotic lesion noted sub first metatarsal head on right.  Musculoskeletal: MMT 5/5 bilateral lower extremities in DF, PF, Inversion and Eversion. Deceased ROM in DF of ankle joint.  Neurological: Sensation intact to light touch.   Assessment:   1. Pain due to onychomycosis of toenails of  both feet   2. Diabetic peripheral neuropathy associated with type 2 diabetes mellitus ([name])      Plan:  Patient was evaluated and treated and all questions answered. -Discussed and educated patient on diabetic foot care, especially with  regards to the vascular, neurological and musculoskeletal systems.  -Stressed the importance of good glycemic control and the detriment of not  controlling glucose levels in relation to the foot. -Discussed supportive shoes at all times and checking feet regularly.  -Mechanically debrided all nails 1-5 bilateral using sterile nail nipper and filed with dremel without incident  -Answered all patient questions -Patient to return  in 3 months for at risk foot care -Patient advised to call the office if any problems or questions arise in the meantime.   Anthony [name], DPM

## 2023-05-16 ENCOUNTER — Other Ambulatory Visit: Payer: Self-pay

## 2023-05-16 ENCOUNTER — Other Ambulatory Visit (HOSPITAL_COMMUNITY): Payer: Self-pay

## 2023-05-16 DIAGNOSIS — M5412 Radiculopathy, cervical region: Secondary | ICD-10-CM | POA: Diagnosis not present

## 2023-05-16 DIAGNOSIS — M47812 Spondylosis without myelopathy or radiculopathy, cervical region: Secondary | ICD-10-CM | POA: Diagnosis not present

## 2023-05-16 DIAGNOSIS — M5030 Other cervical disc degeneration, unspecified cervical region: Secondary | ICD-10-CM | POA: Diagnosis not present

## 2023-05-16 DIAGNOSIS — M5380 Other specified dorsopathies, site unspecified: Secondary | ICD-10-CM | POA: Diagnosis not present

## 2023-05-16 MED ORDER — METHOCARBAMOL 500 MG PO TABS
250.0000 mg | ORAL_TABLET | Freq: Every evening | ORAL | 5 refills | Status: DC | PRN
Start: 2023-05-16 — End: 2023-07-06
  Filled 2023-05-16: qty 30, 30d supply, fill #0

## 2023-05-17 ENCOUNTER — Other Ambulatory Visit: Payer: Self-pay

## 2023-05-17 ENCOUNTER — Other Ambulatory Visit (HOSPITAL_COMMUNITY): Payer: Self-pay

## 2023-05-18 ENCOUNTER — Other Ambulatory Visit: Payer: Self-pay

## 2023-05-22 ENCOUNTER — Other Ambulatory Visit: Payer: Self-pay | Admitting: Internal Medicine

## 2023-05-28 ENCOUNTER — Other Ambulatory Visit (INDEPENDENT_AMBULATORY_CARE_PROVIDER_SITE_OTHER): Payer: Medicare HMO

## 2023-05-28 DIAGNOSIS — Z1211 Encounter for screening for malignant neoplasm of colon: Secondary | ICD-10-CM

## 2023-05-29 LAB — FECAL OCCULT BLOOD, IMMUNOCHEMICAL: Fecal Occult Bld: NEGATIVE

## 2023-05-31 ENCOUNTER — Encounter: Payer: Self-pay | Admitting: Internal Medicine

## 2023-06-01 ENCOUNTER — Ambulatory Visit: Payer: Self-pay

## 2023-06-01 NOTE — Patient Outreach (Signed)
Care Coordination   06/01/2023 Name: Anthony [name] MRN: [id number] DOB: May 26, 1946   Care Coordination Outreach Attempts:  An unsuccessful telephone outreach was attempted for a scheduled appointment today. HIPAA compliant message left with return call phone number.   Follow Up Plan:  Additional outreach attempts will be made to offer the patient care coordination information and services.   Encounter Outcome:  No Answer   Care Coordination Interventions:  No, not indicated    George Ina [facility] Care Coordination [phone] direct line

## 2023-06-12 ENCOUNTER — Telehealth: Payer: Self-pay | Admitting: Family Medicine

## 2023-06-12 NOTE — Telephone Encounter (Signed)
Spoke with pt notifying him rx was sent on 04/20/23, #90/ 2 refills to [facility] pharmacy. I asked pt if he wanted rx moved. He states it's ok to leave it at [facility] and he'll call them tomorrow.

## 2023-06-12 NOTE — Telephone Encounter (Signed)
Prescription Request  06/12/2023  LOV: 05/07/2023  What is the name of the medication or equipment? olmesartan-hydrochlorothiazide (BENICAR HCT) 40-25 MG tablet    Have you contacted your pharmacy to request a refill? No   Which pharmacy would you like this sent to?  [id number], [place] - [address] [place] [address] Phone: [phone] Fax: [phone]    Patient notified that their request is being sent to the clinical staff for review and that they should receive a response within 2 business days.   Please advise at Mobile [phone] (mobile)  Patient would like all future refills to be sent to [facility] as well

## 2023-06-14 ENCOUNTER — Other Ambulatory Visit: Payer: Self-pay | Admitting: Family Medicine

## 2023-06-18 ENCOUNTER — Other Ambulatory Visit (HOSPITAL_COMMUNITY): Payer: Self-pay

## 2023-06-19 ENCOUNTER — Other Ambulatory Visit: Payer: Self-pay

## 2023-06-30 ENCOUNTER — Emergency Department: Payer: Medicare HMO

## 2023-06-30 DIAGNOSIS — S51011A Laceration without foreign body of right elbow, initial encounter: Secondary | ICD-10-CM | POA: Diagnosis not present

## 2023-06-30 DIAGNOSIS — S0990XA Unspecified injury of head, initial encounter: Secondary | ICD-10-CM | POA: Diagnosis not present

## 2023-06-30 DIAGNOSIS — W1830XA Fall on same level, unspecified, initial encounter: Secondary | ICD-10-CM | POA: Diagnosis not present

## 2023-06-30 DIAGNOSIS — Z5321 Procedure and treatment not carried out due to patient leaving prior to being seen by health care provider: Secondary | ICD-10-CM | POA: Diagnosis not present

## 2023-06-30 DIAGNOSIS — S61411A Laceration without foreign body of right hand, initial encounter: Secondary | ICD-10-CM | POA: Diagnosis not present

## 2023-06-30 DIAGNOSIS — S199XXA Unspecified injury of neck, initial encounter: Secondary | ICD-10-CM | POA: Diagnosis not present

## 2023-06-30 DIAGNOSIS — R9089 Other abnormal findings on diagnostic imaging of central nervous system: Secondary | ICD-10-CM | POA: Diagnosis not present

## 2023-06-30 LAB — CBG MONITORING, ED: Glucose-Capillary: 436 mg/dL — ABNORMAL HIGH (ref 70–99)

## 2023-06-30 NOTE — ED Notes (Signed)
Unsure if tetanus is up to date.

## 2023-06-30 NOTE — ED Notes (Signed)
Patient called for room placement at this time. UTL in lobby or restrooms.

## 2023-06-30 NOTE — ED Triage Notes (Signed)
Pt here to ER for a mechanical fall outside. Was able to catch himself but rolled onto right side. Cut on right hand and elbow. Not on blood thinners. Unsure if head hit cement. CBG 446

## 2023-07-01 ENCOUNTER — Emergency Department
Admission: EM | Admit: 2023-07-01 | Discharge: 2023-07-01 | Discharge status: Against Medical Advice | Payer: Medicare HMO | Attending: Emergency Medicine | Admitting: Emergency Medicine

## 2023-07-03 ENCOUNTER — Encounter: Payer: Self-pay | Admitting: Family Medicine

## 2023-07-03 ENCOUNTER — Inpatient Hospital Stay: Admission: RE | Admit: 2023-07-03 | Payer: Medicare HMO | Source: Ambulatory Visit

## 2023-07-03 ENCOUNTER — Ambulatory Visit (INDEPENDENT_AMBULATORY_CARE_PROVIDER_SITE_OTHER): Payer: Medicare HMO | Admitting: Family Medicine

## 2023-07-03 VITALS — BP 136/64 | HR 82 | Temp 98.1°F | Ht 69.75 in | Wt 216.2 lb

## 2023-07-03 DIAGNOSIS — Z23 Encounter for immunization: Secondary | ICD-10-CM

## 2023-07-03 DIAGNOSIS — W19XXXA Unspecified fall, initial encounter: Secondary | ICD-10-CM | POA: Diagnosis not present

## 2023-07-03 DIAGNOSIS — Z638 Other specified problems related to primary support group: Secondary | ICD-10-CM | POA: Diagnosis not present

## 2023-07-03 DIAGNOSIS — Z7189 Other specified counseling: Secondary | ICD-10-CM | POA: Diagnosis not present

## 2023-07-03 DIAGNOSIS — Z794 Long term (current) use of insulin: Secondary | ICD-10-CM | POA: Diagnosis not present

## 2023-07-03 DIAGNOSIS — S61411A Laceration without foreign body of right hand, initial encounter: Secondary | ICD-10-CM | POA: Insufficient documentation

## 2023-07-03 DIAGNOSIS — S51011A Laceration without foreign body of right elbow, initial encounter: Secondary | ICD-10-CM | POA: Insufficient documentation

## 2023-07-03 DIAGNOSIS — E11649 Type 2 diabetes mellitus with hypoglycemia without coma: Secondary | ICD-10-CM

## 2023-07-03 DIAGNOSIS — G3184 Mild cognitive impairment, so stated: Secondary | ICD-10-CM

## 2023-07-03 HISTORY — DX: Laceration without foreign body of right hand, initial encounter: S61.411A

## 2023-07-03 MED ORDER — CEPHALEXIN 500 MG PO CAPS: 500.0000 mg | ORAL_CAPSULE | Freq: Four times a day (QID) | ORAL | 0 refills | Status: DC

## 2023-07-03 NOTE — Patient Instructions (Addendum)
I will ask home health to come out to the house after recent fall, also help discuss further assistance options.  Update tetanus shot today.  Continue daily dressing changes  Start keflex antibiotic sent to pharmacy.

## 2023-07-03 NOTE — Progress Notes (Unsigned)
Ph: [phone] Fax: [phone]   Patient ID: Anthony [name], male    DOB: Oct 02, 1945, 77 y.o.   MRN: [id number]  This visit was conducted in person.  BP 136/64   Pulse 82   Temp 98.1 F (36.7 C) (Oral)   Ht 5' 9.75" (1.772 m)   Wt 216 lb 4 oz (98.1 kg)   SpO2 98%   BMI 31.25 kg/m    CC: hand laceration after fall  Subjective:   HPI: Anthony [name] is a 77 y.o. male presenting on 07/03/2023 for Fall (C/o laceration to R lower arm/hand. after fall on 07/01/23. Also, has red spot on forehead. Went to [facility] ED but left before finishing visit. Last Td- 05/2013. Pt accompanied by son, Anthony [name]. )   DOI:07/01/2023 Larey Seat at home walking up driveway fell face first, sustained laceration to R lower arm into hand.  Went to [facility] ER but left prior to being seen after waiting 9 hours.  ER records showed cbg 436, head and neck CT without acute fracture or bleed. Neck CT: Disc levels: Multilevel cervical spondylosis with advanced facet arthropathy on the right.   No h/o MRSA  DM - has been off his medicines for several weeks - especially metformin.  Son notes progressive worsening memory trouble in father as well as longterm in mother.  Mother now has bowel/bladder incontinence.   Saw [facility] since last seen - plan continue tylenol TID scheduled as well as robaxin 1/2 tab nightly, declined PT. F/u left open ended  They bring advanced directives with HCPOA which will be reviewed.      Relevant past medical, surgical, family and social history reviewed and updated as indicated. Interim medical history since our last visit reviewed. Allergies and medications reviewed and updated. Outpatient Medications Prior to Visit  Medication Sig Dispense Refill   amLODipine (NORVASC) 5 MG tablet Take 1 tablet (5 mg total) by mouth daily. For blood pressure 90 tablet 2   aspirin EC 81 MG tablet Take 1 tablet (81 mg total) by mouth daily. Swallow whole. 30 tablet 12   b complex vitamins  tablet Take 1 tablet by mouth daily.     Continuous Blood Gluc Sensor (DEXCOM G7 SENSOR) MISC by Does not apply route.     Dextrose, Diabetic Use, (GLUCOSE PO) Take 1 tablet by mouth as needed.     donepezil (ARICEPT) 5 MG tablet Take 1 tablet (5 mg total) by mouth at bedtime. 90 tablet 2   GVOKE HYPOPEN 1-PACK 1 MG/0.2ML SOAJ Inject under skin 0.2 mL as needed for hypoglycemia 0.2 mL 11   insulin aspart (NOVOLOG FLEXPEN) 100 UNIT/ML FlexPen Inject 12-18 Units into the skin 3 (three) times daily before meals. Per sliding scale: 12 units 100-200, 15 units 200-300, 18 units >300 cbg 60 mL 1   insulin degludec (TRESIBA FLEXTOUCH) 200 UNIT/ML FlexTouch Pen INJECT 32 UNITS SUBCUTANEOUSLY DAILY 9 mL 10   Insulin Pen Needle (ADVOCATE INSULIN PEN NEEDLES) 31G X 5 MM MISC Use 3x a day 300 each 3   meloxicam (MOBIC) 15 MG tablet Take by mouth.     metFORMIN (GLUCOPHAGE-XR) 500 MG 24 hr tablet TAKE 1 TABLET BY MOUTH TWICE DAILY WITH A MEAL 180 tablet 3   methocarbamol (ROBAXIN) 500 MG tablet 1/2-1 po qHS prn     methocarbamol (ROBAXIN) 500 MG tablet Take 0.5-1 tablets (250-500 mg total) by mouth at bedtime as needed. 30 tablet 5   olmesartan-hydrochlorothiazide (BENICAR HCT) 40-25 MG tablet  Take 1 tablet by mouth daily. 90 tablet 2   ONETOUCH ULTRA test strip USE TO check blood glucose four times daily AS DIRECTED 150 strip 3   pantoprazole (PROTONIX) 40 MG tablet Take 1 tablet (40 mg total) by mouth daily. 90 tablet 2   simvastatin (ZOCOR) 20 MG tablet Take 1 tablet (20 mg total) by mouth at bedtime. 90 tablet 2   tadalafil (CIALIS) 20 MG tablet Take 0.5-1 tablets (10-20 mg total) by mouth every other day as needed. 5 tablet 3   No facility-administered medications prior to visit.     Per HPI unless specifically indicated in ROS section below Review of Systems  Objective:  BP 136/64   Pulse 82   Temp 98.1 F (36.7 C) (Oral)   Ht 5' 9.75" (1.772 m)   Wt 216 lb 4 oz (98.1 kg)   SpO2 98%   BMI  31.25 kg/m   Wt Readings from Last 3 Encounters:  07/03/23 216 lb 4 oz (98.1 kg)  05/07/23 221 lb 2 oz (100.3 kg)  04/13/23 218 lb 6 oz (99.1 kg)      Physical Exam Vitals and nursing note reviewed.  Constitutional:      Appearance: Normal appearance. He is not ill-appearing or diaphoretic.  Skin:    General: Skin is warm and dry.     Findings: Abrasion and laceration present. No erythema or rash.     Comments:  Skin tear to lateral right elbow with preserved elbow ROM.  ~2.5cm skin laceration to medial right palm overlying 5th MCPJ without signs of tendon involvement  Neurological:     Mental Status: He is alert.        Wound care: Wounds cleaned, irrigated with saline, dressed with triple antibiotic ointment followed by nonstick gauze, fluffy Kerlix roll gauze and hand wound dressed with Coban wrap.  Patient tolerated well.  R medial hand    Lab Results  Component Value Date   NA 142 05/07/2023   CL 102 05/07/2023   K 4.1 05/07/2023   CO2 32 05/07/2023   BUN 18 05/07/2023   CREATININE 0.91 05/07/2023   GFR 82.80 05/07/2023   CALCIUM 9.0 05/07/2023   PHOS 3.7 07/05/2011   ALBUMIN 3.8 05/07/2023   GLUCOSE 272 (H) 05/07/2023    Results for orders placed or performed during the hospital encounter of 07/01/23  CBG monitoring, ED  Result Value Ref Range   Glucose-Capillary 436 (H) 70 - 99 mg/dL   Assessment & Plan:   Problem List Items Addressed This Visit     Advanced care planning/counseling discussion (Chronic)    Advanced directive - received, scanned 06/2023. Son Anthony [name] and daughter Anthony [name] are HCPOA. Does not want prolonged life support if terminal condition.       MCI (mild cognitive impairment) with memory loss    Chronic, progressive over the past 2 months, in setting of hyperglycemia.  Discussed increasing level of care needs for himself and his wife with patient and son. Son states they are looking into personal care services.  Children are also  becoming more active in managing their parents health care. They agree to home health evaluation-referral placed.      Relevant Orders   Ambulatory referral to Home Health   Type 2 diabetes mellitus with hypoglycemia ([name])    Ongoing brittle diabetes. It seems he had not been taking metformin for several weeks. Anticipate likely confusion with basal and rapid acting insulin. Discussed importance of glycemic control in  setting of wounds to optimize healing and minimize infection risk. I encouraged son to continue actively helping manage medications, they also agreed to home health evaluation given history of brittle diabetes with hyperglycemia and hypoglycemia, memory difficulties, recent falls.      Relevant Orders   Ambulatory referral to Home Health   Caregiver role strain    Cares for wife with dementia. He also has significant memory difficulties. See below.       Relevant Orders   Ambulatory referral to Home Health   Laceration of right hand without foreign body - Primary    Too late to suture.  Wound cleaned and dressed in office as per above.  Discussed wound care at home.  Will prescribe Keflex prophylactically given dirty wound, will update tetanus vaccination as well. Return to clinic in 3 days for wound check. Red flags to seek urgent care reviewed including spreading redness, draining pus, fever.      Relevant Orders   Td vaccine greater than or equal to 7yo preservative free IM (Completed)   Ambulatory referral to Home Health   Skin tear of right elbow without complication    Wound cleaned and dressed in office as per above.  Home care instructions provided      Relevant Orders   Ambulatory referral to Home Health   Other Visit Diagnoses     Injury due to fall, initial encounter       Relevant Orders   Ambulatory referral to Home Health        Meds ordered this encounter  Medications   cephALEXin (KEFLEX) 500 MG capsule    Sig: Take 1 capsule (500 mg  total) by mouth 4 (four) times daily.    Dispense:  20 capsule    Refill:  0    Orders Placed This Encounter  Procedures   Td vaccine greater than or equal to 7yo preservative free IM   Ambulatory referral to Home Health    Referral Priority:   Routine    Referral Type:   Home Health Care    Referral Reason:   Specialty Services Required    Requested Specialty:   Home Health Services    Number of Visits Requested:   1    Patient Instructions  I will ask home health to come out to the house after recent fall, also help discuss further assistance options.  Update tetanus shot today.  Continue daily dressing changes  Start keflex antibiotic sent to pharmacy.   Follow up plan: Return if symptoms worsen or fail to improve.  [name], MD

## 2023-07-04 NOTE — Assessment & Plan Note (Addendum)
Ongoing brittle diabetes. It seems he had not been taking metformin for several weeks. Anticipate likely confusion with basal and rapid acting insulin. Discussed importance of glycemic control in setting of wounds to optimize healing and minimize infection risk. I encouraged son to continue actively helping manage medications, they also agreed to home health evaluation given history of brittle diabetes with hyperglycemia and hypoglycemia, memory difficulties, recent falls.

## 2023-07-04 NOTE — Assessment & Plan Note (Signed)
Wound cleaned and dressed in office as per above.  Home care instructions provided

## 2023-07-04 NOTE — Assessment & Plan Note (Signed)
Chronic, progressive over the past 2 months, in setting of hyperglycemia.  Discussed increasing level of care needs for himself and his wife with patient and son. Son states they are looking into personal care services.  Children are also becoming more active in managing their parents health care. They agree to home health evaluation-referral placed.

## 2023-07-04 NOTE — Assessment & Plan Note (Signed)
Too late to suture.  Wound cleaned and dressed in office as per above.  Discussed wound care at home.  Will prescribe Keflex prophylactically given dirty wound, will update tetanus vaccination as well. Return to clinic in 3 days for wound check. Red flags to seek urgent care reviewed including spreading redness, draining pus, fever.

## 2023-07-04 NOTE — Assessment & Plan Note (Signed)
Advanced directive - received, scanned 06/2023. Son Olney Monier and daughter Wyatt Mage are HCPOA. Does not want prolonged life support if terminal condition.

## 2023-07-04 NOTE — Assessment & Plan Note (Signed)
Cares for wife with dementia. He also has significant memory difficulties. See below.

## 2023-07-06 ENCOUNTER — Ambulatory Visit (INDEPENDENT_AMBULATORY_CARE_PROVIDER_SITE_OTHER): Payer: Medicare HMO | Admitting: Family Medicine

## 2023-07-06 ENCOUNTER — Encounter: Payer: Self-pay | Admitting: Family Medicine

## 2023-07-06 VITALS — BP 158/82 | HR 80 | Temp 98.4°F | Ht 69.75 in | Wt 219.0 lb

## 2023-07-06 DIAGNOSIS — S51011D Laceration without foreign body of right elbow, subsequent encounter: Secondary | ICD-10-CM

## 2023-07-06 DIAGNOSIS — I10 Essential (primary) hypertension: Secondary | ICD-10-CM | POA: Diagnosis not present

## 2023-07-06 DIAGNOSIS — S61411D Laceration without foreign body of right hand, subsequent encounter: Secondary | ICD-10-CM

## 2023-07-06 DIAGNOSIS — Z794 Long term (current) use of insulin: Secondary | ICD-10-CM

## 2023-07-06 DIAGNOSIS — E11649 Type 2 diabetes mellitus with hypoglycemia without coma: Secondary | ICD-10-CM | POA: Diagnosis not present

## 2023-07-06 DIAGNOSIS — G3184 Mild cognitive impairment, so stated: Secondary | ICD-10-CM

## 2023-07-06 DIAGNOSIS — E113299 Type 2 diabetes mellitus with mild nonproliferative diabetic retinopathy without macular edema, unspecified eye: Secondary | ICD-10-CM

## 2023-07-06 NOTE — Patient Instructions (Addendum)
[facility] should be coming out next week for evaluation.  Continue wound care as up to now - wounds are improving. Return on Tues or Wed noon appointment for wound check.  Restart amlodipine 5mg  daily monitoring dizziness. Restart aspirin.

## 2023-07-06 NOTE — Progress Notes (Unsigned)
Ph: [phone] Fax: [phone]   Patient ID: Anthony [name], male    DOB: [birth year], 77 y.o.   MRN: [id number]  This visit was conducted in person.  BP (!) 158/82   Pulse 80   Temp 98.4 F (36.9 C) (Oral)   Ht 5' 9.75" (1.772 m)   Wt 219 lb (99.3 kg)   SpO2 96%   BMI 31.65 kg/m   BP Readings from Last 3 Encounters:  07/06/23 (!) 158/82  07/03/23 136/64  06/30/23 (!) 185/92  Standing BP 170/96  CC: wound check  Subjective:   HPI: Anthony [name] is a 77 y.o. male presenting on 07/06/2023 for Wound Check (Here to check R hand laceration. Pt accompanied by daughter, Angelica Chessman. Wants to discuss amlodipine and aspirin.)   See prior note for details.  Seen here on Tuesday after fall at home with resultant R hand laceration and R elbow skin tear.  Td updated at that time. Started on keflex antibiotic preventatively.   DM - CBG 538 this morning. Received 18 u this morning. Some confusion over meds - now getting from [facility].  CGM data: DexCom G7 - did not bring readings.  Lab Results  Component Value Date   HGBA1C 7.4 (A) 04/13/2023       Relevant past medical, surgical, family and social history reviewed and updated as indicated. Interim medical history since our last visit reviewed. Allergies and medications reviewed and updated. Outpatient Medications Prior to Visit  Medication Sig Dispense Refill   amLODipine (NORVASC) 5 MG tablet Take 1 tablet (5 mg total) by mouth daily. For blood pressure 90 tablet 2   aspirin EC 81 MG tablet Take 1 tablet (81 mg total) by mouth daily. Swallow whole. 30 tablet 12   b complex vitamins tablet Take 1 tablet by mouth daily.     cephALEXin (KEFLEX) 500 MG capsule Take 1 capsule (500 mg total) by mouth 4 (four) times daily. 20 capsule 0   Continuous Blood Gluc Sensor (DEXCOM G7 SENSOR) MISC by Does not apply route.     Dextrose, Diabetic Use, (GLUCOSE PO) Take 1 tablet by mouth as needed.     donepezil (ARICEPT) 5 MG tablet  Take 1 tablet (5 mg total) by mouth at bedtime. 90 tablet 2   GVOKE HYPOPEN 1-PACK 1 MG/0.2ML SOAJ Inject under skin 0.2 mL as needed for hypoglycemia 0.2 mL 11   insulin aspart (NOVOLOG FLEXPEN) 100 UNIT/ML FlexPen Inject 12-18 Units into the skin 3 (three) times daily before meals. Per sliding scale: 12 units 100-200, 15 units 200-300, 18 units >300 cbg 60 mL 1   insulin degludec (TRESIBA FLEXTOUCH) 200 UNIT/ML FlexTouch Pen INJECT 32 UNITS SUBCUTANEOUSLY DAILY 9 mL 10   Insulin Pen Needle (ADVOCATE INSULIN PEN NEEDLES) 31G X 5 MM MISC Use 3x a day 300 each 3   metFORMIN (GLUCOPHAGE-XR) 500 MG 24 hr tablet TAKE 1 TABLET BY MOUTH TWICE DAILY WITH A MEAL 180 tablet 3   olmesartan-hydrochlorothiazide (BENICAR HCT) 40-25 MG tablet Take 1 tablet by mouth daily. 90 tablet 2   ONETOUCH ULTRA test strip USE TO check blood glucose four times daily AS DIRECTED 150 strip 3   pantoprazole (PROTONIX) 40 MG tablet Take 1 tablet (40 mg total) by mouth daily. 90 tablet 2   simvastatin (ZOCOR) 20 MG tablet Take 1 tablet (20 mg total) by mouth at bedtime. 90 tablet 2   tadalafil (CIALIS) 20 MG tablet Take 0.5-1 tablets (10-20 mg total) by  mouth every other day as needed. 5 tablet 3   meloxicam (MOBIC) 15 MG tablet Take by mouth.     methocarbamol (ROBAXIN) 500 MG tablet 1/2-1 po qHS prn     methocarbamol (ROBAXIN) 500 MG tablet Take 0.5-1 tablets (250-500 mg total) by mouth at bedtime as needed. 30 tablet 5   No facility-administered medications prior to visit.     Per HPI unless specifically indicated in ROS section below Review of Systems  Objective:  BP (!) 158/82   Pulse 80   Temp 98.4 F (36.9 C) (Oral)   Ht 5' 9.75" (1.772 m)   Wt 219 lb (99.3 kg)   SpO2 96%   BMI 31.65 kg/m   Wt Readings from Last 3 Encounters:  07/06/23 219 lb (99.3 kg)  07/03/23 216 lb 4 oz (98.1 kg)  05/07/23 221 lb 2 oz (100.3 kg)      Physical Exam Vitals and nursing note reviewed.  Constitutional:       Appearance: Normal appearance. He is not ill-appearing or diaphoretic.  Skin:    General: Skin is warm and dry.     Findings: Abrasion and laceration present. No erythema or rash.     Comments:  Diminished size of skin tear to lateral right elbow.  ~2cm skin laceration to medial right palm overlying 5th MCPJ with residual open skin without erythema  Neurological:     Mental Status: He is alert.    Wound care: Wounds cleaned, irrigated with saline, dressed with triple antibiotic ointment followed by nonstick gauze, fluffy Kerlix roll gauze and hand wound dressed with Coban wrap.  Patient tolerated well.  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   No orders of the defined types were placed in this encounter.   Patient Instructions  [facility] should be coming out next week for evaluation.  Continue wound care as up to now - wounds are improving. Return on Tues or Wed noon appointment for wound check.  Restart amlodipine 5mg  daily monitoring dizziness. Restart aspirin.   Follow up plan: Return if symptoms worsen or fail to improve.  [name],  MD

## 2023-07-06 NOTE — Assessment & Plan Note (Signed)
Rec restart amlodipine 5mg  daily

## 2023-07-07 NOTE — Assessment & Plan Note (Signed)
This continues improving.  

## 2023-07-07 NOTE — Assessment & Plan Note (Signed)
Chronic, deteriorated control off metformin - he has restarted this.  Continue tresiba 32u daily with SSI novolog TID.  H/o brittle diabetes with hyper and hypoglycemia

## 2023-07-07 NOTE — Assessment & Plan Note (Deleted)
Chronic, deteriorated control off metformin - he has restarted this.  Continue tresiba 32u daily with SSI novolog TID.  H/o brittle diabetes with hyper and hypoglycemia

## 2023-07-07 NOTE — Assessment & Plan Note (Addendum)
This complicate care Pending HH eval, I've asked them to help assess level of care needs.   Family is planning to set up personal care services at home.

## 2023-07-07 NOTE — Assessment & Plan Note (Signed)
Evidence of healing by second intention.  Wound cleaned and dressed.  Finish keflex.  RTC next week for anticipated final wound check.

## 2023-07-09 DIAGNOSIS — Z794 Long term (current) use of insulin: Secondary | ICD-10-CM | POA: Diagnosis not present

## 2023-07-09 DIAGNOSIS — E113299 Type 2 diabetes mellitus with mild nonproliferative diabetic retinopathy without macular edema, unspecified eye: Secondary | ICD-10-CM | POA: Diagnosis not present

## 2023-07-10 ENCOUNTER — Telehealth: Payer: Self-pay | Admitting: Family Medicine

## 2023-07-10 NOTE — Telephone Encounter (Signed)
Sarah from [facility] called to let Dr. Reece Agar know they had received a referral from [facility] for the pt for Physical therapy, nursing & social work. Maralyn Sago states on yesterday, 11/11, a nurse visited the pt & during the visit, the nurse spoke to the pt's son, Thayer Ohm. Maralyn Sago states the nurse says Thayer Ohm was very skeptical about HH being started for pt. Maralyn Sago states Thayer Ohm mentioned HH not being mentioned by Dr. Evette Doffing requested [facility] be discussed during pt's ov on tomorrow, 11/13 with Dr. Reece Agar? Call back # [phone]

## 2023-07-10 NOTE — Telephone Encounter (Signed)
We did discuss this when I placed [facility] referral. Will again discuss tomorrow.

## 2023-07-11 ENCOUNTER — Encounter: Payer: Self-pay | Admitting: Family Medicine

## 2023-07-11 ENCOUNTER — Ambulatory Visit (INDEPENDENT_AMBULATORY_CARE_PROVIDER_SITE_OTHER): Payer: Medicare HMO | Admitting: Family Medicine

## 2023-07-11 VITALS — BP 142/86 | HR 72 | Temp 97.1°F | Wt 213.6 lb

## 2023-07-11 DIAGNOSIS — E11649 Type 2 diabetes mellitus with hypoglycemia without coma: Secondary | ICD-10-CM | POA: Diagnosis not present

## 2023-07-11 DIAGNOSIS — S51011D Laceration without foreign body of right elbow, subsequent encounter: Secondary | ICD-10-CM | POA: Diagnosis not present

## 2023-07-11 DIAGNOSIS — S61411D Laceration without foreign body of right hand, subsequent encounter: Secondary | ICD-10-CM | POA: Diagnosis not present

## 2023-07-11 DIAGNOSIS — Z794 Long term (current) use of insulin: Secondary | ICD-10-CM

## 2023-07-11 DIAGNOSIS — G3184 Mild cognitive impairment, so stated: Secondary | ICD-10-CM | POA: Diagnosis not present

## 2023-07-11 NOTE — Patient Instructions (Addendum)
Continue dressing changes as up to now. Wounds are looking better.  Drop novolog mealtime correction to 10 units if sugar 100-200, continue 15 units and 18 units as per before.  Increase tresiba to 34 units daily for the next 5 days then back down to 32 units daily.

## 2023-07-11 NOTE — Assessment & Plan Note (Addendum)
Continued healing with full granulation tissue covering wound. Cleaned and dressed. Continue wrapping at home until fully closed. F/u left open ended.

## 2023-07-11 NOTE — Assessment & Plan Note (Signed)
Children note that since they've been more involved in his care and helping with food prep, sugars are improving, cognition also improving.

## 2023-07-11 NOTE — Progress Notes (Signed)
Ph: [phone] Fax: [phone]   Patient ID: Anthony [name], male    DOB: Jun 05, 1946, 77 y.o.   MRN: [id number]  This visit was conducted in person.  BP (!) 142/86 (BP Location: Left Arm, Patient Position: Sitting, Cuff Size: Large)   Pulse 72   Temp (!) 97.1 F (36.2 C) (Tympanic)   Wt 213 lb 9.6 oz (96.9 kg)   SpO2 97%   BMI 30.87 kg/m    CC: wound check  Subjective:   HPI: Anthony [name] is a 77 y.o. male presenting on 07/11/2023 for Medical Management of Chronic Issues (Wound check and b/p follow up)   See prior notes for details.  Fall sustained at home with resultant R hand laceration and R elbow skin tear.  Td up to date.  Completed keflex antibiotic course.  Continues wrapping daily at home.   They are hesitant to proceed with [facility] given confusion over SW involvement. They will consider options.   DM - sugars remain markedly elevated (he had come off metformin for over a month).  He restarted metformin last week, then increased to BID dosing on Friday. He continues tresiba 32u daily with novolog SSI with meals. Son notes mealtime correction 12u novolog can lead to hypoglycemia.   CGM data: DexCom G7 30 day average sugar: 254, time in range 26%, hypoglycemia <1%, stage 1 hyperglycemia 23%, stage 2 hyperglycemia 50%.       Relevant past medical, surgical, family and social history reviewed and updated as indicated. Interim medical history since our last visit reviewed. Allergies and medications reviewed and updated. Outpatient Medications Prior to Visit  Medication Sig Dispense Refill   amLODipine (NORVASC) 5 MG tablet Take 1 tablet (5 mg total) by mouth daily. For blood pressure 90 tablet 2   aspirin EC 81 MG tablet Take 1 tablet (81 mg total) by mouth daily. Swallow whole. 30 tablet 12   b complex vitamins tablet Take 1 tablet by mouth daily.     cephALEXin (KEFLEX) 500 MG capsule Take 1 capsule (500 mg total) by mouth 4 (four) times daily. 20 capsule  0   Continuous Blood Gluc Sensor (DEXCOM G7 SENSOR) MISC by Does not apply route.     Dextrose, Diabetic Use, (GLUCOSE PO) Take 1 tablet by mouth as needed.     donepezil (ARICEPT) 5 MG tablet Take 1 tablet (5 mg total) by mouth at bedtime. 90 tablet 2   GVOKE HYPOPEN 1-PACK 1 MG/0.2ML SOAJ Inject under skin 0.2 mL as needed for hypoglycemia 0.2 mL 11   insulin degludec (TRESIBA FLEXTOUCH) 200 UNIT/ML FlexTouch Pen INJECT 32 UNITS SUBCUTANEOUSLY DAILY 9 mL 10   Insulin Pen Needle (ADVOCATE INSULIN PEN NEEDLES) 31G X 5 MM MISC Use 3x a day 300 each 3   metFORMIN (GLUCOPHAGE-XR) 500 MG 24 hr tablet TAKE 1 TABLET BY MOUTH TWICE DAILY WITH A MEAL 180 tablet 3   olmesartan-hydrochlorothiazide (BENICAR HCT) 40-25 MG tablet Take 1 tablet by mouth daily. 90 tablet 2   ONETOUCH ULTRA test strip USE TO check blood glucose four times daily AS DIRECTED 150 strip 3   pantoprazole (PROTONIX) 40 MG tablet Take 1 tablet (40 mg total) by mouth daily. 90 tablet 2   simvastatin (ZOCOR) 20 MG tablet Take 1 tablet (20 mg total) by mouth at bedtime. 90 tablet 2   tadalafil (CIALIS) 20 MG tablet Take 0.5-1 tablets (10-20 mg total) by mouth every other day as needed. 5 tablet 3   insulin  aspart (NOVOLOG FLEXPEN) 100 UNIT/ML FlexPen Inject 12-18 Units into the skin 3 (three) times daily before meals. Per sliding scale: 12 units 100-200, 15 units 200-300, 18 units >300 cbg 60 mL 1   insulin aspart (NOVOLOG FLEXPEN) 100 UNIT/ML FlexPen Inject 12-18 Units into the skin 3 (three) times daily before meals. Per sliding scale: 10 units 100-200, 15 units 200-300, 18 units >300 cbg     No facility-administered medications prior to visit.     Per HPI unless specifically indicated in ROS section below Review of Systems  Objective:  BP (!) 142/86 (BP Location: Left Arm, Patient Position: Sitting, Cuff Size: Large)   Pulse 72   Temp (!) 97.1 F (36.2 C) (Tympanic)   Wt 213 lb 9.6 oz (96.9 kg)   SpO2 97%   BMI 30.87 kg/m    Wt Readings from Last 3 Encounters:  07/11/23 213 lb 9.6 oz (96.9 kg)  07/06/23 219 lb (99.3 kg)  07/03/23 216 lb 4 oz (98.1 kg)      Physical Exam Vitals and nursing note reviewed.  Constitutional:      Appearance: Normal appearance. He is not ill-appearing or diaphoretic.  Skin:    General: Skin is warm and dry.     Findings: Abrasion and laceration present. No erythema or rash.     Comments:  Lateral right elbow skin tear almost fully scabbed without surrounding erythema or drainage.  ~2cm skin laceration to medial right palm overlying 5th MCPJ with developing granulation tissue without erythema  Neurological:     Mental Status: He is alert.    Wound care: Wounds cleaned with gauze and saline, dressed with triple antibiotic ointment, followed by nonstick gauze, fluffy Kerlix roll gauze and Coban wrap. Patient tolerated well.   R medial hand      Assessment & Plan:   Problem List Items Addressed This Visit     MCI (mild cognitive impairment) with memory loss    Children note that since they've been more involved in his care and helping with food prep, sugars are improving, cognition also improving.       Type 2 diabetes mellitus with hypoglycemia ([name])    Persistent hyperglycemia based on CGM data reviewed.  Increase tresiba to 34u x 5 days then drop back to 32u daily. Change mealtime correction SSI to 100-199 10u; 200-299 15u; >300 18u.  Continue metformin 500mg  bid. Keep f/u next month.       Relevant Medications   insulin aspart (NOVOLOG FLEXPEN) 100 UNIT/ML FlexPen   Laceration of right hand without foreign body - Primary    Continued healing with full granulation tissue covering wound. Cleaned and dressed. Continue wrapping at home until fully closed. F/u left open ended.       Skin tear of right elbow without complication    Continues healing well. Almost fully scabbed up Home care instructions provided. Once fully scabbed, no longer need to wrap/cover.          No orders of the defined types were placed in this encounter.   No orders of the defined types were placed in this encounter.   Patient Instructions  Continue dressing changes as up to now. Wounds are looking better.  Drop novolog mealtime correction to 10 units if sugar 100-200, continue 15 units and 18 units as per before.  Increase tresiba to 34 units daily for the next 5 days then back down to 32 units daily.   Follow up plan: Return if symptoms worsen  or fail to improve.  [name], MD

## 2023-07-11 NOTE — Assessment & Plan Note (Addendum)
Persistent hyperglycemia based on CGM data reviewed.  Increase tresiba to 34u x 5 days then drop back to 32u daily. Change mealtime correction SSI to 100-199 10u; 200-299 15u; >300 18u.  Continue metformin 500mg  bid. Keep f/u next month.

## 2023-07-11 NOTE — Assessment & Plan Note (Signed)
Continues healing well. Almost fully scabbed up Home care instructions provided. Once fully scabbed, no longer need to wrap/cover.

## 2023-08-06 ENCOUNTER — Other Ambulatory Visit: Payer: Self-pay | Admitting: Family Medicine

## 2023-08-06 ENCOUNTER — Ambulatory Visit (INDEPENDENT_AMBULATORY_CARE_PROVIDER_SITE_OTHER): Payer: Medicare HMO | Admitting: Family Medicine

## 2023-08-06 ENCOUNTER — Encounter: Payer: Self-pay | Admitting: Family Medicine

## 2023-08-06 VITALS — BP 134/70 | HR 89 | Temp 98.4°F | Ht 69.75 in | Wt 215.2 lb

## 2023-08-06 DIAGNOSIS — Z638 Other specified problems related to primary support group: Secondary | ICD-10-CM

## 2023-08-06 DIAGNOSIS — M170 Bilateral primary osteoarthritis of knee: Secondary | ICD-10-CM

## 2023-08-06 DIAGNOSIS — G3184 Mild cognitive impairment, so stated: Secondary | ICD-10-CM

## 2023-08-06 DIAGNOSIS — E11649 Type 2 diabetes mellitus with hypoglycemia without coma: Secondary | ICD-10-CM

## 2023-08-06 DIAGNOSIS — Z794 Long term (current) use of insulin: Secondary | ICD-10-CM

## 2023-08-06 LAB — POCT GLYCOSYLATED HEMOGLOBIN (HGB A1C): Hemoglobin A1C: 8.4 % — AB (ref 4.0–5.6)

## 2023-08-06 MED ORDER — TRESIBA FLEXTOUCH 200 UNIT/ML ~~LOC~~ SOPN: 34.0000 [IU] | PEN_INJECTOR | Freq: Every day | SUBCUTANEOUS | 3 refills | Status: DC

## 2023-08-06 NOTE — Assessment & Plan Note (Signed)
Chronic, continues aricept 5mg  daily.  Son notes worsened cognition when diabetes uncontrolled.

## 2023-08-06 NOTE — Patient Instructions (Addendum)
Goal fasting sugars 80-120, goal 2 hours after meal <200.  Consider palliative care home evaluation - let me know if you'd like referral for you and your wife.  Increase tresiba to 34 units daily in am  Goal A1c <8%, ideally <7.5%.  Start tylenol ES 500mg  twice daily.  Return in 3 months for follow up visit on diabetes.

## 2023-08-06 NOTE — Assessment & Plan Note (Signed)
Offered palliative care referral - they will consider.

## 2023-08-06 NOTE — Assessment & Plan Note (Addendum)
Chronic, mild deterioration noted by worsened A1c levels.  Reviewed with patient and son goal sugar range as well as CGM data with 30d average.  Has brittle diabetes with h/o hypoglycemia so hesitant to make too many changes at once - will increase [place] from 32 to 34u daily. Will continue Novolog insulin TID AC with SSI (10u if 100-200; 15u if 200-300; 18u if >300). RTC 3 mo DM f/u visit.  Children are involved, working on low sugar diet choices and regular medication administration

## 2023-08-06 NOTE — Assessment & Plan Note (Addendum)
Chronic, end stage, endo was unable to clear him for knee replacement 2023 due to marked hyperglycemia. They would still like to proceed with surgery. Recommend goal A1c <8, ideally <7.5% before considering surgery. Will continue working towards diabetes control as per above.  I did recommend scheduled tylenol 500mg  BID for chronic knee pain.  Avoiding NSAIDs due to increased cardiovascular risk.  Avoiding stronger pain meds due to increased fall risk.  Would avoid steroids in brittle diabetic.

## 2023-08-06 NOTE — Progress Notes (Signed)
Ph: [phone] Fax: [phone]   Patient ID: Anthony [name], male    DOB: 1946-08-03, 77 y.o.   MRN: [id number]  This visit was conducted in person.  BP 134/70   Pulse 89   Temp 98.4 F (36.9 C) (Oral)   Ht 5' 9.75" (1.772 m)   Wt 215 lb 4 oz (97.6 kg)   SpO2 94%   BMI 31.11 kg/m    CC: DM f/u visit  Subjective:   HPI: Anthony [name] is a 76 y.o. male presenting on 08/06/2023 for Medical Management of Chronic Issues (Here for 3 mo DM f/u. Pt accompanied by son, Thayer Ohm. ) and Arm Injury (C/o abrasion to L elbow. Pt does not remember how/when injury occurred. And Thayer Ohm was not aware of injury. 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 He has memory issues himself. Children have become more involved in care. Son thinks memory issues are more closely tied to hyper- and hypoglycemia.   DM - does regularly check sugars. Compliant with antihyperglycemic regimen which includes: tresiba 32u daily, metformin XR 500mg  bid, novolog flexpen 12-18u with meals with SSI (10u 100-200, 15u 200-300, 18u >300). Denies low sugars or hypoglycemic symptoms. Denies paresthesias, blurry vision. Last diabetic eye exam 10/2022. Glucometer brand: one touch ultra. Last foot exam: 12/2022. DSME: declined.   Released from endo care 08/2022 due to nonadherence to regimen - was recommended taking novolog before meal to help avoid significant postprandial hyperglycemia.   CGM data: Dexcom G7 30 day average sugar: 245, time in range 24%, hypoglycemia <1%, stage 1 hyperglycemia 30%, stage 2 hyperglycemia 45%.   Lab Results  Component Value Date   HGBA1C 8.4 (A) 08/06/2023   Diabetic Foot Exam - Simple   No data filed    Lab Results  Component Value Date   MICROALBUR 8.5 (H) 05/07/2023         Relevant past medical, surgical, family and social history reviewed and updated as indicated. Interim medical history since our last visit reviewed. Allergies and medications reviewed and  updated. Outpatient Medications Prior to Visit  Medication Sig Dispense Refill   acetaminophen (TYLENOL) 500 MG tablet Take 1 tablet (500 mg total) by mouth in the morning and at bedtime.     amLODipine (NORVASC) 5 MG tablet Take 1 tablet (5 mg total) by mouth daily. For blood pressure 90 tablet 2   aspirin EC 81 MG tablet Take 1 tablet (81 mg total) by mouth daily. Swallow whole. 30 tablet 12   b complex vitamins tablet Take 1 tablet by mouth daily.     Continuous Blood Gluc Sensor (DEXCOM G7 SENSOR) MISC by Does not apply route.     Dextrose, Diabetic Use, (GLUCOSE PO) Take 1 tablet by mouth as needed.     donepezil (ARICEPT) 5 MG tablet Take 1 tablet (5 mg total) by mouth at bedtime. 90 tablet 2   GVOKE HYPOPEN 1-PACK 1 MG/0.2ML SOAJ Inject under skin 0.2 mL as needed for hypoglycemia 0.2 mL 11   insulin aspart (NOVOLOG FLEXPEN) 100 UNIT/ML FlexPen Inject 12-18 Units into the skin 3 (three) times daily before meals. Per sliding scale: 10 units 100-200, 15 units 200-300, 18 units >300 cbg     Insulin Pen Needle (ADVOCATE INSULIN PEN NEEDLES) 31G X 5 MM MISC Use 3x a day 300 each 3   metFORMIN (GLUCOPHAGE-XR) 500 MG 24 hr tablet TAKE 1 TABLET BY MOUTH TWICE DAILY WITH A MEAL 180 tablet 3   olmesartan-hydrochlorothiazide (BENICAR  HCT) 40-25 MG tablet Take 1 tablet by mouth daily. 90 tablet 2   ONETOUCH ULTRA test strip USE TO check blood glucose four times daily AS DIRECTED 150 strip 3   pantoprazole (PROTONIX) 40 MG tablet Take 1 tablet (40 mg total) by mouth daily. 90 tablet 2   simvastatin (ZOCOR) 20 MG tablet Take 1 tablet (20 mg total) by mouth at bedtime. 90 tablet 2   tadalafil (CIALIS) 20 MG tablet Take 0.5-1 tablets (10-20 mg total) by mouth every other day as needed. 5 tablet 3   insulin degludec (TRESIBA FLEXTOUCH) 200 UNIT/ML FlexTouch Pen INJECT 32 UNITS SUBCUTANEOUSLY DAILY 9 mL 10   cephALEXin (KEFLEX) 500 MG capsule Take 1 capsule (500 mg total) by mouth 4 (four) times daily. 20  capsule 0   No facility-administered medications prior to visit.     Per HPI unless specifically indicated in ROS section below Review of Systems  Objective:  BP 134/70   Pulse 89   Temp 98.4 F (36.9 C) (Oral)   Ht 5' 9.75" (1.772 m)   Wt 215 lb 4 oz (97.6 kg)   SpO2 94%   BMI 31.11 kg/m   Wt Readings from Last 3 Encounters:  08/06/23 215 lb 4 oz (97.6 kg)  07/11/23 213 lb 9.6 oz (96.9 kg)  07/06/23 219 lb (99.3 kg)      Physical Exam Vitals and nursing note reviewed.  Constitutional:      Appearance: Normal appearance. He is not ill-appearing.  HENT:     Head: Normocephalic and atraumatic.     Mouth/Throat:     Mouth: Mucous membranes are moist.     Pharynx: Oropharynx is clear. No oropharyngeal exudate or posterior oropharyngeal erythema.  Eyes:     Extraocular Movements: Extraocular movements intact.     Conjunctiva/sclera: Conjunctivae normal.     Pupils: Pupils are equal, round, and reactive to light.  Cardiovascular:     Rate and Rhythm: Normal rate and regular rhythm.     Pulses: Normal pulses.     Heart sounds: Normal heart sounds. No murmur heard. Pulmonary:     Effort: Pulmonary effort is normal. No respiratory distress.     Breath sounds: Normal breath sounds. No wheezing, rhonchi or rales.  Musculoskeletal:     Right lower leg: No edema.     Left lower leg: No edema.     Comments:  See HPI for foot exam if done End stage arthritis to bilateral knees with crepitus present  Skin:    General: Skin is warm and dry.     Findings: No rash.  Neurological:     Mental Status: He is alert.  Psychiatric:        Mood and Affect: Mood normal.        Behavior: Behavior normal.       Results for orders placed or performed in visit on 08/06/23  POCT glycosylated hemoglobin (Hb A1C)  Result Value Ref Range   Hemoglobin A1C 8.4 (A) 4.0 - 5.6 %   HbA1c POC (<> result, manual entry)     HbA1c, POC (prediabetic range)     HbA1c, POC (controlled diabetic  range)      Assessment & Plan:   Problem List Items Addressed This Visit     MCI (mild cognitive impairment) with memory loss    Chronic, continues aricept 5mg  daily.  Son notes worsened cognition when diabetes uncontrolled.       Type 2 diabetes mellitus with hypoglycemia (  [name]) - Primary    Chronic, mild deterioration noted by worsened A1c levels.  Reviewed with patient and son goal sugar range as well as CGM data with 30d average.  Has brittle diabetes with h/o hypoglycemia so hesitant to make too many changes at once - will increase [place] from 32 to 34u daily. Will continue Novolog insulin TID AC with SSI (10u if 100-200; 15u if 200-300; 18u if >300). RTC 3 mo DM f/u visit.  Children are involved, working on low sugar diet choices and regular medication administration      Relevant Medications   insulin degludec (TRESIBA FLEXTOUCH) 200 UNIT/ML FlexTouch Pen   Other Relevant Orders   POCT glycosylated hemoglobin (Hb A1C) (Completed)   Primary osteoarthritis of both knees    Chronic, end stage, endo was unable to clear him for knee replacement 2023 due to marked hyperglycemia. They would still like to proceed with surgery. Recommend goal A1c <8, ideally <7.5% before considering surgery. Will continue working towards diabetes control as per above.  I did recommend scheduled tylenol 500mg  BID for chronic knee pain.  Avoiding NSAIDs due to increased cardiovascular risk.  Avoiding stronger pain meds due to increased fall risk.  Would avoid steroids in brittle diabetic.       Relevant Medications   acetaminophen (TYLENOL) 500 MG tablet   Caregiver role strain    Offered palliative care referral - they will consider.         Meds ordered this encounter  Medications   insulin degludec (TRESIBA FLEXTOUCH) 200 UNIT/ML FlexTouch Pen    Sig: Inject 34 Units into the skin daily. INJECT 32 UNITS SUBCUTANEOUSLY DAILY    Dispense:  9 mL    Refill:  3    Note new dose    Orders  Placed This Encounter  Procedures   POCT glycosylated hemoglobin (Hb A1C)    Patient Instructions  Goal fasting sugars 80-120, goal 2 hours after meal <200.  Consider palliative care home evaluation - let me know if you'd like referral for you and your wife.  Increase tresiba to 34 units daily in am  Goal A1c <8%, ideally <7.5%.  Start tylenol ES 500mg  twice daily.  Return in 3 months for follow up visit on diabetes.   Follow up plan: Return in about 3 months (around 11/04/2023) for follow up visit.  [name], MD

## 2023-08-08 DIAGNOSIS — E113299 Type 2 diabetes mellitus with mild nonproliferative diabetic retinopathy without macular edema, unspecified eye: Secondary | ICD-10-CM | POA: Diagnosis not present

## 2023-08-08 DIAGNOSIS — Z794 Long term (current) use of insulin: Secondary | ICD-10-CM | POA: Diagnosis not present

## 2023-08-13 ENCOUNTER — Ambulatory Visit (INDEPENDENT_AMBULATORY_CARE_PROVIDER_SITE_OTHER): Payer: Medicare HMO | Admitting: Podiatry

## 2023-08-13 ENCOUNTER — Encounter: Payer: Self-pay | Admitting: Podiatry

## 2023-08-13 DIAGNOSIS — M79675 Pain in left toe(s): Secondary | ICD-10-CM | POA: Diagnosis not present

## 2023-08-13 DIAGNOSIS — M79674 Pain in right toe(s): Secondary | ICD-10-CM | POA: Diagnosis not present

## 2023-08-13 DIAGNOSIS — E1142 Type 2 diabetes mellitus with diabetic polyneuropathy: Secondary | ICD-10-CM

## 2023-08-13 DIAGNOSIS — B351 Tinea unguium: Secondary | ICD-10-CM | POA: Diagnosis not present

## 2023-08-13 NOTE — Progress Notes (Signed)
  Subjective:  Patient ID: Anthony [name], male    DOB: 05-Feb-1946,   MRN: [id number]  No chief complaint on file.   77 y.o. male presents for concern of thickened elongated and painful nails that are difficult to trim. Requesting to have them trimmed today. Relates burning and tingling in their feet. Patient is diabetic and last A1c was  Lab Results  Component Value Date   HGBA1C 8.4 (A) 08/06/2023   .   PCP:  [name], MD    . Denies any other pedal complaints. Denies n/v/f/c.   Past Medical History:  Diagnosis Date   Aortic atherosclerosis ([name]) 02/28/2020   By CT   Barrett's esophagus 12/08/2015   By EGD 2010 [name]    BPH with obstruction/lower urinary tract symptoms 2014   s/p TURP   COPD (chronic obstructive pulmonary disease) ([name]) 02/28/2020   By CT: diffuse bronchial wall thickening with mild centrilobular and paraseptal emphysema (01/2020)   Coronary artery calcification seen on CAT scan 02/28/2020   3v CAD - calcified atherosclerotic plaque in the left main, left anterior descending and left circumflex coronary arteries (01/2020)   Diabetes mellitus type 2 with complications ([name])    completed DMSE   DKA (diabetic ketoacidoses) 01/2010   "in coma"   Ex-smoker 01/21/2020   GERD (gastroesophageal reflux disease)    HTN (hypertension)    Hyperlipidemia    controlled with medicine   Laceration of right hand without foreign body 07/03/2023   Osteoarthritis    Rheumatic fever    maybe as child    Objective:  Physical Exam: Vascular: DP/PT pulses 2/4 bilateral. CFT <3 seconds. Normal hair growth on digits. No edema.  Skin. No lacerations or abrasions bilateral feet. Hyperkeratotic lesion noted sub first metatarsal head on right.  Musculoskeletal: MMT 5/5 bilateral lower extremities in DF, PF, Inversion and Eversion. Deceased ROM in DF of ankle joint.  Neurological: Sensation intact to light touch.   Assessment:   1. Pain due to onychomycosis of  toenails of both feet   2. Diabetic peripheral neuropathy associated with type 2 diabetes mellitus ([name])      Plan:  Patient was evaluated and treated and all questions answered. -Discussed and educated patient on diabetic foot care, especially with  regards to the vascular, neurological and musculoskeletal systems.  -Stressed the importance of good glycemic control and the detriment of not  controlling glucose levels in relation to the foot. -Discussed supportive shoes at all times and checking feet regularly.  -Mechanically debrided all nails 1-5 bilateral using sterile nail nipper and filed with dremel without incident  -Answered all patient questions -Patient to return  in 3 months for at risk foot care -Patient advised to call the office if any problems or questions arise in the meantime.   Louann Sjogren, DPM

## 2023-09-04 ENCOUNTER — Other Ambulatory Visit: Payer: Self-pay

## 2023-09-04 DIAGNOSIS — G3184 Mild cognitive impairment, so stated: Secondary | ICD-10-CM

## 2023-09-04 MED ORDER — AMLODIPINE BESYLATE 5 MG PO TABS: 5.0000 mg | ORAL_TABLET | Freq: Every day | ORAL | 2 refills | Status: DC

## 2023-09-04 MED ORDER — DONEPEZIL HCL 5 MG PO TABS: 5.0000 mg | ORAL_TABLET | Freq: Every day | ORAL | 2 refills | Status: DC

## 2023-09-04 NOTE — Telephone Encounter (Signed)
 Copied from CRM [id number]. Topic: Clinical - Medication Refill >> Sep 04, 2023  9:31 AM Merlynn LABOR wrote: Most Recent Primary Care Visit:  Provider: [name]  Department: [facility]  Visit Type: OFFICE VISIT  Date: 08/06/2023  Medication: amLODipine  (NORVASC ) 5 MG tablet, donepezil  (ARICEPT ) 5 MG tablet   Has the patient contacted their pharmacy? No (Agent: If no, request that the patient contact the pharmacy for the refill. If patient does not wish to contact the pharmacy document the reason why and proceed with request.) (Agent: If yes, when and what did the pharmacy advise?)  Patient did not contact pharmacy due to bottle showing no refills available. Patient would like medication refill to be sent to pharmacy listed below as previous pharmacy has been closed down. Advised patient to still reach out to pharmacy to see if any refills are available.   Is this the correct pharmacy for this prescription? Yes If no, delete pharmacy and type the correct one.  This is the patient's preferred pharmacy:  [facility] [phone] [place] - [address] [place] [address] Phone: [phone] Fax: [phone]    Has the prescription been filled recently? Yes  Is the patient out of the medication? Yes  Has the patient been seen for an appointment in the last year OR does the patient have an upcoming appointment? Yes  Can we respond through MyChart? Yes  Agent: Please be advised that Rx refills may take up to 3 business days. We ask that you follow-up with your pharmacy.

## 2023-09-06 ENCOUNTER — Ambulatory Visit: Payer: Medicare HMO

## 2023-09-06 DIAGNOSIS — M2041 Other hammer toe(s) (acquired), right foot: Secondary | ICD-10-CM

## 2023-09-06 DIAGNOSIS — L84 Corns and callosities: Secondary | ICD-10-CM

## 2023-09-06 DIAGNOSIS — E1142 Type 2 diabetes mellitus with diabetic polyneuropathy: Secondary | ICD-10-CM

## 2023-09-06 DIAGNOSIS — M2012 Hallux valgus (acquired), left foot: Secondary | ICD-10-CM

## 2023-09-06 NOTE — Progress Notes (Signed)
 Patient presents to the office today for diabetic shoe and insole measuring.  Patient was measured with [employer] device to determine size and width for 1 pair of extra depth shoes and foam casted for 3 pair of insoles.   Documentation of medical necessity will be sent to patient's treating diabetic doctor to verify and sign.   Patient's diabetic provider: Anton Agreste MD   Shoes and insoles will be ordered at that time and patient will be notified for an appointment for fitting when they arrive.   Shoe size (per patient): 9.5 Schetter measurement: 9 Patient shoe selection- Shoe choice:   X521M / V854M Shoe size ordered: 9.50M ABN signed      Use scans from last order

## 2023-09-08 DIAGNOSIS — E113299 Type 2 diabetes mellitus with mild nonproliferative diabetic retinopathy without macular edema, unspecified eye: Secondary | ICD-10-CM | POA: Diagnosis not present

## 2023-09-08 DIAGNOSIS — Z794 Long term (current) use of insulin: Secondary | ICD-10-CM | POA: Diagnosis not present

## 2023-09-20 ENCOUNTER — Other Ambulatory Visit: Payer: Self-pay | Admitting: Family Medicine

## 2023-09-22 ENCOUNTER — Other Ambulatory Visit (HOSPITAL_COMMUNITY): Payer: Self-pay

## 2023-09-24 DIAGNOSIS — N529 Male erectile dysfunction, unspecified: Secondary | ICD-10-CM | POA: Diagnosis not present

## 2023-09-24 DIAGNOSIS — Z833 Family history of diabetes mellitus: Secondary | ICD-10-CM | POA: Diagnosis not present

## 2023-09-24 DIAGNOSIS — E785 Hyperlipidemia, unspecified: Secondary | ICD-10-CM | POA: Diagnosis not present

## 2023-09-24 DIAGNOSIS — Z87891 Personal history of nicotine dependence: Secondary | ICD-10-CM | POA: Diagnosis not present

## 2023-09-24 DIAGNOSIS — I700 Atherosclerosis of aorta: Secondary | ICD-10-CM | POA: Diagnosis not present

## 2023-09-24 DIAGNOSIS — I2510 Atherosclerotic heart disease of native coronary artery without angina pectoris: Secondary | ICD-10-CM | POA: Diagnosis not present

## 2023-09-24 DIAGNOSIS — M1990 Unspecified osteoarthritis, unspecified site: Secondary | ICD-10-CM | POA: Diagnosis not present

## 2023-09-24 DIAGNOSIS — I10 Essential (primary) hypertension: Secondary | ICD-10-CM | POA: Diagnosis not present

## 2023-09-24 DIAGNOSIS — Z008 Encounter for other general examination: Secondary | ICD-10-CM | POA: Diagnosis not present

## 2023-09-24 DIAGNOSIS — G3184 Mild cognitive impairment, so stated: Secondary | ICD-10-CM | POA: Diagnosis not present

## 2023-09-24 DIAGNOSIS — Z794 Long term (current) use of insulin: Secondary | ICD-10-CM | POA: Diagnosis not present

## 2023-09-24 DIAGNOSIS — E119 Type 2 diabetes mellitus without complications: Secondary | ICD-10-CM | POA: Diagnosis not present

## 2023-09-24 DIAGNOSIS — R269 Unspecified abnormalities of gait and mobility: Secondary | ICD-10-CM | POA: Diagnosis not present

## 2023-09-29 ENCOUNTER — Other Ambulatory Visit (HOSPITAL_COMMUNITY): Payer: Self-pay

## 2023-10-03 ENCOUNTER — Telehealth: Payer: Self-pay

## 2023-10-03 NOTE — Telephone Encounter (Signed)
 Left pt vm to schedule DM shoe pick up

## 2023-10-10 ENCOUNTER — Telehealth: Payer: Self-pay

## 2023-10-10 NOTE — Telephone Encounter (Signed)
Called pt back because he left a voicemail, my call went straight to voicemail. He is trying to check on his shoes, they are in, I called back on 10/03/23 to schedule.

## 2023-10-11 DIAGNOSIS — E113299 Type 2 diabetes mellitus with mild nonproliferative diabetic retinopathy without macular edema, unspecified eye: Secondary | ICD-10-CM | POA: Diagnosis not present

## 2023-10-11 DIAGNOSIS — Z794 Long term (current) use of insulin: Secondary | ICD-10-CM | POA: Diagnosis not present

## 2023-10-15 ENCOUNTER — Telehealth: Payer: Self-pay

## 2023-10-15 NOTE — Telephone Encounter (Signed)
Left voicemail, goes straight to vm when you dial. Left pt vm to schedule diabetic shoe pick up

## 2023-10-29 ENCOUNTER — Other Ambulatory Visit: Payer: Self-pay | Admitting: Family Medicine

## 2023-10-29 DIAGNOSIS — Z794 Long term (current) use of insulin: Secondary | ICD-10-CM

## 2023-10-30 NOTE — Telephone Encounter (Signed)
 Novolog Flexpen Last rx:  07/11/23 Last OV:  08/06/23, 3 mo DM f/u Next OV:  11/07/23, 3 mo DM f/u

## 2023-11-01 MED ORDER — NOVOLOG FLEXPEN 100 UNIT/ML ~~LOC~~ SOPN: 12.0000 [IU] | PEN_INJECTOR | Freq: Three times a day (TID) | SUBCUTANEOUS | 1 refills | Status: DC

## 2023-11-01 NOTE — Telephone Encounter (Signed)
 ERx

## 2023-11-05 ENCOUNTER — Ambulatory Visit: Payer: Medicare HMO | Admitting: Family Medicine

## 2023-11-07 ENCOUNTER — Encounter: Payer: Self-pay | Admitting: Family Medicine

## 2023-11-07 ENCOUNTER — Ambulatory Visit: Payer: Medicare HMO | Admitting: Family Medicine

## 2023-11-07 VITALS — BP 128/64 | HR 100 | Temp 98.0°F | Ht 69.75 in | Wt 217.1 lb

## 2023-11-07 DIAGNOSIS — E11649 Type 2 diabetes mellitus with hypoglycemia without coma: Secondary | ICD-10-CM | POA: Diagnosis not present

## 2023-11-07 DIAGNOSIS — Z794 Long term (current) use of insulin: Secondary | ICD-10-CM

## 2023-11-07 DIAGNOSIS — G3184 Mild cognitive impairment, so stated: Secondary | ICD-10-CM | POA: Diagnosis not present

## 2023-11-07 LAB — POCT GLYCOSYLATED HEMOGLOBIN (HGB A1C): Hemoglobin A1C: 7.9 % — AB (ref 4.0–5.6)

## 2023-11-07 NOTE — Patient Instructions (Addendum)
 Find out what diet you're following and let me know.  Return in 4-6 weeks for follow up diabetes with glucose numbers.  Come with Anthony [name] to next appointment

## 2023-11-07 NOTE — Progress Notes (Signed)
 Ph: [phone] Fax: [phone]   Patient ID: Anthony [name], male    DOB: May 07, 1946, 78 y.o.   MRN: [id number]  This visit was conducted in person.  BP 128/64   Pulse 100   Temp 98 F (36.7 C) (Oral)   Ht 5' 9.75" (1.772 m)   Wt 217 lb 2 oz (98.5 kg)   SpO2 95%   BMI 31.38 kg/m    CC: DM f/u visit  Subjective:   HPI: Anthony [name] is a 78 y.o. male presenting on 11/07/2023 for Medical Management of Chronic Issues (Here for 3 mo DM f/u. Pt accompanied by wife, [name]. 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 He has memory issues himself. Children have become more involved in care. Son thinks memory issues are tied to hyper- and hypoglycemia.  MCI - he continues aricept 5mg  nightly.   Juanita Craver son-in-law makes them breakfast every day.  Son Thayer Ohm has placed him on a new meal plan ([employer]) for dinner - pt worries this is leading to low sugars.   Saw podiatry, pending diabetic shoes.   DM - does  intermittently check sugars but not documenting consistently. Compliant with antihyperglycemic regimen which includes: tresiba 34u daily, metformin XR 500mg  bid, novolog flexpen [written as 10-18u with meals with SSI (10u 100-200, 15u 200-300, 18u >300) - he doesn't know how he is taking insulin]. Notes ongoing low sugars. Denies paresthesias, blurry vision. Last diabetic eye exam 10/2022. Glucometer brand: one touch ultra. Last foot exam: 12/2022. DSME: declined. CGM data: DexCom G7  30 day average sugar: 230, time in range 32%, hypoglycemia <1%, stage 1 hyperglycemia 30%, stage 2 hyperglycemia 37%.   Lab Results  Component Value Date   HGBA1C 7.9 (A) 11/07/2023   Diabetic Foot Exam - Simple   No data filed    Lab Results  Component Value Date   MICROALBUR 8.5 (H) 05/07/2023     He was released from endo care 08/2022 due to nonadherence to regimen - was recommended taking novolog before meal to help avoid significant postprandial hyperglycemia.      Relevant  past medical, surgical, family and social history reviewed and updated as indicated. Interim medical history since our last visit reviewed. Allergies and medications reviewed and updated. Outpatient Medications Prior to Visit  Medication Sig Dispense Refill   acetaminophen (TYLENOL) 500 MG tablet Take 1 tablet (500 mg total) by mouth in the morning and at bedtime.     amLODipine (NORVASC) 5 MG tablet Take 1 tablet (5 mg total) by mouth daily. For blood pressure 90 tablet 2   aspirin EC 81 MG tablet Take 1 tablet (81 mg total) by mouth daily. Swallow whole. 30 tablet 12   b complex vitamins tablet Take 1 tablet by mouth daily.     Continuous Blood Gluc Sensor (DEXCOM G7 SENSOR) MISC by Does not apply route.     Dextrose, Diabetic Use, (GLUCOSE PO) Take 1 tablet by mouth as needed.     donepezil (ARICEPT) 5 MG tablet Take 1 tablet (5 mg total) by mouth at bedtime. 90 tablet 2   GVOKE HYPOPEN 1-PACK 1 MG/0.2ML SOAJ Inject under skin 0.2 mL as needed for hypoglycemia 0.2 mL 11   insulin aspart (NOVOLOG FLEXPEN) 100 UNIT/ML FlexPen Inject 12-18 Units into the skin 3 (three) times daily before meals. Per sliding scale: 10 units 100-200, 15 units 200-300, 18 units >300 cbg 15 mL 1   Insulin Degludec FlexTouch 200 UNIT/ML SOPN  Inject 34 Units into the skin daily. 9 mL 3   Insulin Pen Needle (ADVOCATE INSULIN PEN NEEDLES) 31G X 5 MM MISC Use 3x a day 300 each 3   metFORMIN (GLUCOPHAGE-XR) 500 MG 24 hr tablet TAKE 1 TABLET BY MOUTH TWICE DAILY WITH A MEAL 180 tablet 3   olmesartan-hydrochlorothiazide (BENICAR HCT) 40-25 MG tablet Take 1 tablet by mouth daily. 90 tablet 2   ONETOUCH ULTRA test strip USE TO check blood glucose four times daily AS DIRECTED 150 strip 3   pantoprazole (PROTONIX) 40 MG tablet Take 1 tablet (40 mg total) by mouth daily. 90 tablet 2   simvastatin (ZOCOR) 20 MG tablet Take 1 tablet (20 mg total) by mouth at bedtime. 90 tablet 2   tadalafil (CIALIS) 20 MG tablet Take 0.5-1 tablets  (10-20 mg total) by mouth every other day as needed. 5 tablet 3   No facility-administered medications prior to visit.     Per HPI unless specifically indicated in ROS section below Review of Systems  Objective:  BP 128/64   Pulse 100   Temp 98 F (36.7 C) (Oral)   Ht 5' 9.75" (1.772 m)   Wt 217 lb 2 oz (98.5 kg)   SpO2 95%   BMI 31.38 kg/m   Wt Readings from Last 3 Encounters:  11/07/23 217 lb 2 oz (98.5 kg)  08/06/23 215 lb 4 oz (97.6 kg)  07/11/23 213 lb 9.6 oz (96.9 kg)      Physical Exam Vitals and nursing note reviewed.  Constitutional:      Appearance: Normal appearance. He is not ill-appearing.  HENT:     Head: Normocephalic and atraumatic.  Cardiovascular:     Rate and Rhythm: Normal rate and regular rhythm.     Pulses: Normal pulses.     Heart sounds: Normal heart sounds. No murmur heard. Pulmonary:     Effort: Pulmonary effort is normal. No respiratory distress.     Breath sounds: Normal breath sounds. No wheezing, rhonchi or rales.  Neurological:     Mental Status: He is alert.  Psychiatric:        Mood and Affect: Mood normal.        Behavior: Behavior normal.       Results for orders placed or performed in visit on 11/07/23  POCT glycosylated hemoglobin (Hb A1C)   Collection Time: 11/07/23  4:31 PM  Result Value Ref Range   Hemoglobin A1C 7.9 (A) 4.0 - 5.6 %   HbA1c POC (<> result, manual entry)     HbA1c, POC (prediabetic range)     HbA1c, POC (controlled diabetic range)      Assessment & Plan:   Problem List Items Addressed This Visit     MCI (mild cognitive impairment) with memory loss   Continue aricept 5mg  nightly, continue dose titration.  Children are more involved - see below.  Continue working towards stabilizing glycemic control.       Type 2 diabetes mellitus with hypoglycemia ([name]) - Primary   Chronic, insulin dependent, cognitive issues complicate care.  Now trying prepackaged meal plan through [employer].  A1c is improving  however he endorses new episode of hypoglycemia since starting new meal plan - reviewed food label of 1 meal plan he brings which has 27 grams of carbs per dinner. Further complicating care is fact he is unclear how he's administering insulin, not consistent with recording insulin administration, unsure if he's forgetting insulin shots. I cannot titrate insulin appropriately without knowing  how he is taking insulin. I called and spoke with son regarding my concerns over ongoing insulin use with memory impairment - they will continue closely monitoring his medication administration.  I asked him to return in 4-6 wks with one of his children with sugar readings and insulin dosing to review. Anticipate may need less insulin than what is prescribed.       Relevant Orders   POCT glycosylated hemoglobin (Hb A1C) (Completed)     No orders of the defined types were placed in this encounter.   Orders Placed This Encounter  Procedures   POCT glycosylated hemoglobin (Hb A1C)    Patient Instructions  Find out what diet you're following and let me know.  Return in 4-6 weeks for follow up diabetes with glucose numbers.  Come with Thayer Ohm to next appointment   Follow up plan: Return in about 6 weeks (around 12/19/2023) for follow up visit.  [name], MD

## 2023-11-10 NOTE — Assessment & Plan Note (Deleted)
 Due to cognitive/memory issues

## 2023-11-10 NOTE — Assessment & Plan Note (Addendum)
 Continue aricept 5mg  nightly, continue dose titration.  Children are more involved - see below.  Continue working towards stabilizing glycemic control.

## 2023-11-10 NOTE — Assessment & Plan Note (Addendum)
 Chronic, insulin dependent, cognitive issues complicate care.  Now trying prepackaged meal plan through [employer].  A1c is improving however he endorses new episode of hypoglycemia since starting new meal plan - reviewed food label of 1 meal plan he brings which has 27 grams of carbs per dinner. Further complicating care is fact he is unclear how he's administering insulin, not consistent with recording insulin administration, unsure if he's forgetting insulin shots. I cannot titrate insulin appropriately without knowing how he is taking insulin. I called and spoke with son regarding my concerns over ongoing insulin use with memory impairment - they will continue closely monitoring his medication administration.  I asked him to return in 4-6 wks with one of his children with sugar readings and insulin dosing to review. Anticipate may need less insulin than what is prescribed.

## 2023-11-12 ENCOUNTER — Ambulatory Visit (INDEPENDENT_AMBULATORY_CARE_PROVIDER_SITE_OTHER): Payer: Medicare HMO | Admitting: Podiatry

## 2023-11-12 DIAGNOSIS — E1142 Type 2 diabetes mellitus with diabetic polyneuropathy: Secondary | ICD-10-CM | POA: Diagnosis not present

## 2023-11-12 DIAGNOSIS — M79675 Pain in left toe(s): Secondary | ICD-10-CM | POA: Diagnosis not present

## 2023-11-12 DIAGNOSIS — M79674 Pain in right toe(s): Secondary | ICD-10-CM

## 2023-11-12 DIAGNOSIS — B351 Tinea unguium: Secondary | ICD-10-CM | POA: Diagnosis not present

## 2023-11-12 NOTE — Progress Notes (Signed)
  Subjective:  Patient ID: Anthony [name], male    DOB: [birth year],   MRN: [id number]  No chief complaint on file.   78 y.o. male presents for concern of thickened elongated and painful nails that are difficult to trim. Requesting to have them trimmed today. Relates burning and tingling in their feet. Patient is diabetic and last A1c was  Lab Results  Component Value Date   HGBA1C 7.9 (A) 11/07/2023   .   PCP:  [name], MD    . Denies any other pedal complaints. Denies n/v/f/c.   Past Medical History:  Diagnosis Date   Aortic atherosclerosis ([name]) 02/28/2020   By CT   Barrett's esophagus 12/08/2015   By EGD 2010 [name]    BPH with obstruction/lower urinary tract symptoms 2014   s/p TURP   COPD (chronic obstructive pulmonary disease) ([name]) 02/28/2020   By CT: diffuse bronchial wall thickening with mild centrilobular and paraseptal emphysema (01/2020)   Coronary artery calcification seen on CAT scan 02/28/2020   3v CAD - calcified atherosclerotic plaque in the left main, left anterior descending and left circumflex coronary arteries (01/2020)   Diabetes mellitus type 2 with complications ([name])    completed DMSE   DKA (diabetic ketoacidoses) 01/2010   "in coma"   Ex-smoker 01/21/2020   GERD (gastroesophageal reflux disease)    HTN (hypertension)    Hyperlipidemia    controlled with medicine   Laceration of right hand without foreign body 07/03/2023   Osteoarthritis    Rheumatic fever    maybe as child    Objective:  Physical Exam: Vascular: DP/PT pulses 2/4 bilateral. CFT <3 seconds. Normal hair growth on digits. No edema.  Skin. No lacerations or abrasions bilateral feet. Hyperkeratotic lesion noted sub first metatarsal head on right. Nails 1-5 bilateral are thickened dystrophic and with subungudal debris.  Musculoskeletal: MMT 5/5 bilateral lower extremities in DF, PF, Inversion and Eversion. Deceased ROM in DF of ankle joint.  Neurological: Sensation  intact to light touch.   Assessment:   1. Pain due to onychomycosis of toenails of both feet   2. Diabetic peripheral neuropathy associated with type 2 diabetes mellitus ([name])      Plan:  Patient was evaluated and treated and all questions answered. -Discussed and educated patient on diabetic foot care, especially with  regards to the vascular, neurological and musculoskeletal systems.  -Stressed the importance of good glycemic control and the detriment of not  controlling glucose levels in relation to the foot. -Discussed supportive shoes at all times and checking feet regularly.  -Mechanically debrided all nails 1-5 bilateral using sterile nail nipper and filed with dremel without incident  -Answered all patient questions -Patient to return  in 3 months for at risk foot care -Patient advised to call the office if any problems or questions arise in the meantime.   Louann Sjogren, DPM

## 2023-11-15 ENCOUNTER — Ambulatory Visit (INDEPENDENT_AMBULATORY_CARE_PROVIDER_SITE_OTHER): Payer: Medicare HMO

## 2023-11-15 DIAGNOSIS — M2042 Other hammer toe(s) (acquired), left foot: Secondary | ICD-10-CM | POA: Diagnosis not present

## 2023-11-15 DIAGNOSIS — E1142 Type 2 diabetes mellitus with diabetic polyneuropathy: Secondary | ICD-10-CM | POA: Diagnosis not present

## 2023-11-15 DIAGNOSIS — M2012 Hallux valgus (acquired), left foot: Secondary | ICD-10-CM | POA: Diagnosis not present

## 2023-11-15 DIAGNOSIS — M2041 Other hammer toe(s) (acquired), right foot: Secondary | ICD-10-CM | POA: Diagnosis not present

## 2023-11-15 DIAGNOSIS — M2011 Hallux valgus (acquired), right foot: Secondary | ICD-10-CM | POA: Diagnosis not present

## 2023-11-15 DIAGNOSIS — M2141 Flat foot [pes planus] (acquired), right foot: Secondary | ICD-10-CM

## 2023-11-15 DIAGNOSIS — L84 Corns and callosities: Secondary | ICD-10-CM

## 2023-11-19 ENCOUNTER — Ambulatory Visit: Payer: Self-pay

## 2023-11-19 ENCOUNTER — Encounter: Payer: Self-pay | Admitting: Nurse Practitioner

## 2023-11-19 ENCOUNTER — Telehealth: Payer: Self-pay

## 2023-11-19 ENCOUNTER — Ambulatory Visit (INDEPENDENT_AMBULATORY_CARE_PROVIDER_SITE_OTHER): Admitting: Nurse Practitioner

## 2023-11-19 VITALS — BP 134/72 | HR 74 | Temp 98.1°F | Ht 69.75 in | Wt 215.8 lb

## 2023-11-19 DIAGNOSIS — R3129 Other microscopic hematuria: Secondary | ICD-10-CM

## 2023-11-19 DIAGNOSIS — R310 Gross hematuria: Secondary | ICD-10-CM | POA: Diagnosis not present

## 2023-11-19 LAB — POC URINALSYSI DIPSTICK (AUTOMATED)
Bilirubin, UA: NEGATIVE
Blood, UA: POSITIVE
Glucose, UA: POSITIVE — AB
Ketones, UA: POSITIVE
Leukocytes, UA: NEGATIVE
Nitrite, UA: NEGATIVE
Protein, UA: NEGATIVE
Spec Grav, UA: 1.02 (ref 1.010–1.025)
Urobilinogen, UA: 0.2 U/dL
pH, UA: 5 (ref 5.0–8.0)

## 2023-11-19 NOTE — Telephone Encounter (Signed)
    Chief Complaint: blood in urine  Symptoms: blood in urine Frequency: 3 days Pertinent Negatives: Patient denies abdominal pain, pain with urination, frequent urination Disposition: [] ED /[] Urgent Care (no appt availability in office) / [x] Appointment(In office/virtual)/ []  [facility]/ [] Home Care/ [] Refused Recommended Disposition /[] [facility]/ []  Follow-up with PCP Additional Notes: Patient reports that he has been having episodes of bright red urine for the past 3 days. Patient denies any other symptoms/pain. Patient states he has had this happen in the past but that it has gone away on it's own. Per protocol, appt scheduled today 3/24. In office. Patient advised to call back with worsening symptoms. Patient verbalized understanding.      Copied from CRM [id number]. Topic: Clinical - Medical Advice >> Nov 19, 2023 11:35 AM Kathryne Eriksson wrote: Reason for CRM: Medical Advice >> Nov 19, 2023 11:37 AM Kathryne Eriksson wrote: Patient's son "Thayer Ohm" [phone] Called on behalf of his father, states he's experiencing some blood in his urine that's not getting any better. It's actually getting darker, Thayer Ohm states this has been going on for about a month now and they're seeking medical advice.      Reason for Disposition  [1] Pink or red-colored urine and likely from food (beets, rhubarb, red food dye) AND [2] lasts > 24 hours after stopping food  Answer Assessment - Initial Assessment Questions 1. COLOR of URINE: "Describe the color of the urine."  (e.g., tea-colored, pink, red, bloody) "Do you have blood clots in your urine?" (e.g., none, pea, grape, small coin)     Red colored 2. ONSET: "When did the bleeding start?"      3 days  3. EPISODES: "How many times has there been blood in the urine?" or "How many times today?"     "Every time I go to the bathroom" 4. PAIN with URINATION: "Is there any pain with passing your urine?" If Yes, ask: "How bad is the pain?"   (Scale 1-10; or mild, moderate, severe)    - MILD: Complains slightly about urination hurting.    - MODERATE: Interferes with normal activities.      - SEVERE: Excruciating, unwilling or unable to urinate because of the pain.      none 5. FEVER: "Do you have a fever?" If Yes, ask: "What is your temperature, how was it measured, and when did it start?"     none 6. ASSOCIATED SYMPTOMS: "Are you passing urine more frequently than usual?"     no 7. OTHER SYMPTOMS: "Do you have any other symptoms?" (e.g., back/flank pain, abdomen pain, vomiting)     none  Protocols used: Urine - Blood In-A-AH

## 2023-11-19 NOTE — Assessment & Plan Note (Addendum)
 Ddx considered but not limited to UTI, renal calculi, prostatitis, glomerular nephritis, malignant etiologies. Pt was unable to provide a urine specimen in the office today so a collection kit sent home with patient to return at his earliest convenience. CBC and UA pending. Discussed the Ddx with patient and family, and offered option for CT scan. They would like to wait for UA results before deciding to proceed with this option.   I evaluated patient, was consulted regarding treatment, and agree with assessment and plan per Denice Bors RN, FNP Student   Audria Nine, DNP, AGNP-C

## 2023-11-19 NOTE — Progress Notes (Signed)
 Acute Office Visit  Subjective:     Patient ID: Anthony [name], male    DOB: Aug 27, 1946, 78 y.o.   MRN: [id number]  Chief Complaint  Patient presents with   Hematuria    Pt complains of blood in urine ongoing off and on for a couple of months. Mentioned to Dr.Gutierrez on the 12th of blood in urine but cleared up. Pt states now that its back the blood is darker, chunks, and more frequent. No pain or pressure. Pt states he can tell it feels different when urinating.      Patient is in today for hematuria with a history of smoking, HTN. COPD, Barretts esophagus, BPH, Gross hematuria.  Anthony [name] and Anthony [name] are at bedside to provide history  Patient has had intermittent gross hematuria for a year. State that this episode started approx. This episode started out with darker urine followed by bright blood and blood clots. The family states that he has been having night time frequency and does have a hx fof BPH Family states that the most recent episode started on the weekend or right before the weekend.  Patients last BM was today and was normal. It was not painful and he is not having and pain in his perineum, penis, or testicles   Review of Systems  Constitutional:  Negative for chills and fever.  Respiratory:  Negative for shortness of breath.   Cardiovascular:  Negative for chest pain.  Gastrointestinal:  Negative for abdominal pain, nausea and vomiting.  Genitourinary:  Positive for frequency and hematuria.  Psychiatric/Behavioral:  Positive for memory loss.         Objective:    BP 134/72   Pulse 74   Temp 98.1 F (36.7 C) (Oral)   Ht 5' 9.75" (1.772 m)   Wt 215 lb 12.8 oz (97.9 kg)   SpO2 95%   BMI 31.19 kg/m    Physical Exam Vitals and nursing note reviewed.  Constitutional:      Appearance: Normal appearance.  Cardiovascular:     Rate and Rhythm: Normal rate and regular rhythm.     Heart sounds: Normal heart sounds.  Pulmonary:     Effort: Pulmonary  effort is normal.     Breath sounds: Normal breath sounds.  Abdominal:     General: Bowel sounds are normal. There is no distension.     Palpations: There is no mass.     Tenderness: There is no abdominal tenderness. There is no right CVA tenderness or left CVA tenderness.     Hernia: No hernia is present.  Neurological:     Mental Status: He is alert.     No results found for any visits on 11/19/23.      Assessment & Plan:   Problem List Items Addressed This Visit       Genitourinary   Gross hematuria - Primary   Ddx considered but not limited to UTI, renal calculi, prostatitis, glomerular nephritis, malignant etiologies. Pt was unable to provide a urine specimen in the office today so a collection kit sent home with patient to return at his earliest convenience. CBC and UA pending. Discussed the Ddx with patient and family, and offered option for CT scan. They would like to wait for UA results before deciding to proceed with this option.   I evaluated patient, was consulted regarding treatment, and agree with assessment and plan per Denice Bors RN, FNP Student   Audria Nine, DNP, AGNP-C  Relevant Orders   CBC   POCT URINALYSIS DIP (CLINITEK)    No orders of the defined types were placed in this encounter.   Return if symptoms worsen or fail to improve.  Audria Nine, NP

## 2023-11-19 NOTE — Progress Notes (Signed)
 Acute Office Visit  Subjective:     Patient ID: Anthony [name], male    DOB: 09-Apr-1946, 78 y.o.   MRN: [id number]  Chief Complaint  Patient presents with   Hematuria    Pt complains of blood in urine ongoing off and on for a couple of months. Mentioned to Dr.Gutierrez on the 12th of blood in urine but cleared up. Pt states now that its back the blood is darker, chunks, and more frequent. No pain or pressure. Pt states he can tell it feels different when urinating.     Anthony [name] has a hx of DM2, HLD, HTN, BPH, MCI, and former tobacco use is here today for intermittent gross hematuria with clots.  Anthony [name], his wife and Anthony [name] his son joined [place] today to help provide information for the HPI.   He has sporadic isolated episodes of hematuria over the past year. Today he reports it started out as dark followed by bright red with clots. His son reports that he has been having night time frequency for 3-4 nights. He denies any pain, dysuria, urgency, hesitancy, or incomplete emptying, fevers or chills, pain with defecation. His last BM was today after the hematuria and was reported as normal. He did have a stomach virus a month ago that resolved after 2 days. He denies any changes to his medication. He has not taken any OTC medications or supplements.   Review of Systems  Constitutional:  Negative for chills, fever and weight loss.  Respiratory:  Negative for shortness of breath.   Cardiovascular:  Negative for chest pain and palpitations.  Gastrointestinal:  Negative for abdominal pain, nausea and vomiting.  Genitourinary:  Positive for frequency and hematuria. Negative for dysuria, flank pain and urgency.  Musculoskeletal:  Negative for myalgias.  Neurological:  Negative for headaches.        Objective:    BP 134/72   Pulse 74   Temp 98.1 F (36.7 C) (Oral)   Ht 5' 9.75" (1.772 m)   Wt 97.9 kg   SpO2 95%   BMI 31.19 kg/m    Physical Exam Constitutional:      Appearance:  Normal appearance. He is not ill-appearing.  Cardiovascular:     Rate and Rhythm: Normal rate and regular rhythm.     Pulses: Normal pulses.     Heart sounds: Normal heart sounds. No murmur heard.    No friction rub. No gallop.  Pulmonary:     Effort: Pulmonary effort is normal.     Breath sounds: Normal breath sounds.  Abdominal:     Palpations: Abdomen is soft. There is no mass.     Tenderness: There is no abdominal tenderness. There is no right CVA tenderness, left CVA tenderness or guarding.  Neurological:     Mental Status: He is alert.     No results found for any visits on 11/19/23.      Assessment & Plan:   Problem List Items Addressed This Visit     Gross hematuria - Primary   Ddx considered but not limited to UTI, renal calculi, prostatitis, glomerular nephritis, malignant etiologies. Pt was unable to provide a urine specimen in the office today so a collection kit sent home with patient to return at his earliest convenience. CBC and UA pending. Discussed the Ddx with patient and family, and offered option for CT scan. They would like to wait for UA results before deciding to proceed with this option.  Relevant Orders   CBC   POCT URINALYSIS DIP (CLINITEK)    No orders of the defined types were placed in this encounter.   Return if symptoms worsen or fail to improve.  Murvin Donning, RN

## 2023-11-19 NOTE — Addendum Note (Signed)
 Addended by: Eden Emms on: 11/19/2023 04:41 PM   Modules accepted: Orders

## 2023-11-19 NOTE — Telephone Encounter (Signed)
 Patient was evaluated in office

## 2023-11-19 NOTE — Progress Notes (Signed)
   Acute Office Visit  Subjective:     Patient ID: Anthony [name], male    DOB: 1945-10-09, 78 y.o.   MRN: [id number]  Chief Complaint  Patient presents with   Hematuria    Pt complains of blood in urine ongoing off and on for a couple of months. Mentioned to Dr.Gutierrez on the 12th of blood in urine but cleared up. Pt states now that its back the blood is darker, chunks, and more frequent. No pain or pressure. Pt states he can tell it feels different when urinating.     HPI Patient is in today for hematuria with a history of smoking, HTN. COPD, Barretts esophagus, BPH, Gross hematuria.  [name] and [name] are at bedside to provide history  Patient has had intermittent gross hematuria for a year. State that this episode started approx. This episode started out with darker urine followed by bright blood and blood clots. The family states that he has been having night time frequency and does have a hx fof BPH Family states that the most recent episode started on the weekend or right before the weekend.   Review of Systems  Constitutional:  Negative for chills and fever.  Respiratory:  Negative for shortness of breath.   Cardiovascular:  Negative for chest pain.  Gastrointestinal:  Negative for abdominal pain, nausea and vomiting.  Genitourinary:  Positive for frequency and hematuria.  Psychiatric/Behavioral:  Positive for memory loss.         Objective:    BP 134/72   Pulse 74   Temp 98.1 F (36.7 C) (Oral)   Ht 5' 9.75" (1.772 m)   Wt 215 lb 12.8 oz (97.9 kg)   SpO2 95%   BMI 31.19 kg/m    Physical Exam Vitals and nursing note reviewed.  Constitutional:      Appearance: Normal appearance.  Cardiovascular:     Rate and Rhythm: Normal rate and regular rhythm.     Heart sounds: Normal heart sounds.  Pulmonary:     Effort: Pulmonary effort is normal.     Breath sounds: Normal breath sounds.  Abdominal:     General: Bowel sounds are normal. There is no  distension.     Palpations: There is no mass.     Tenderness: There is no abdominal tenderness. There is no right CVA tenderness or left CVA tenderness.     Hernia: No hernia is present.  Neurological:     Mental Status: He is alert.     No results found for any visits on 11/19/23.      Assessment & Plan:   Problem List Items Addressed This Visit       Genitourinary   Gross hematuria - Primary    No orders of the defined types were placed in this encounter.   No follow-ups on file.  Audria Nine, NP

## 2023-11-19 NOTE — Progress Notes (Signed)

## 2023-11-19 NOTE — Patient Instructions (Signed)
 It was a pleasure seeing you today.  If you can not provide a urine today we will send a kit home with you.  Please return a urine specimen at your earliest convenience.  We will contact you once we receive the results from your test.  If you experience any fever, chills, or other concerning symptoms please be seen immediately.

## 2023-11-19 NOTE — Telephone Encounter (Signed)
 Copied from CRM [id number]. Topic: Clinical - Medical Advice >> Nov 19, 2023 11:35 AM Kathryne Eriksson wrote: Reason for CRM: Medical Advice >> Nov 19, 2023 11:37 AM Kathryne Eriksson wrote: Patient's son "Anthony [name]" [phone] Called on behalf of his father, states he's experiencing some blood in his urine that's not getting any better. It's actually getting darker, Anthony [name] states this has been going on for about a month now and they're seeking medical advice.

## 2023-11-19 NOTE — Telephone Encounter (Signed)
 Please see triage encounter.

## 2023-11-19 NOTE — Addendum Note (Signed)
 Addended by: Melina Copa on: 11/19/2023 05:08 PM   Modules accepted: Orders

## 2023-11-20 ENCOUNTER — Telehealth: Payer: Self-pay | Admitting: Nurse Practitioner

## 2023-11-20 LAB — URINE CULTURE
MICRO NUMBER:: 16238791
Result:: NO GROWTH
SPECIMEN QUALITY:: ADEQUATE

## 2023-11-20 LAB — CBC
HCT: 40.6 % (ref 39.0–52.0)
Hemoglobin: 13.5 g/dL (ref 13.0–17.0)
MCHC: 33.1 g/dL (ref 30.0–36.0)
MCV: 87.7 fl (ref 78.0–100.0)
Platelets: 198 10*3/uL (ref 150.0–400.0)
RBC: 4.63 Mil/uL (ref 4.22–5.81)
RDW: 14.1 % (ref 11.5–15.5)
WBC: 7.1 10*3/uL (ref 4.0–10.5)

## 2023-11-20 LAB — URINALYSIS, MICROSCOPIC ONLY

## 2023-11-20 NOTE — Telephone Encounter (Signed)
 Can we let the patient and family know that the urine in office did show blood. I have sent It off to lab and will be in touch once I have the additional labs

## 2023-11-20 NOTE — Telephone Encounter (Signed)
 Called patient and reviewed all information and repeated back to me. Pt verbalized understanding..  no questions or concerns.

## 2023-11-21 ENCOUNTER — Encounter: Payer: Self-pay | Admitting: Nurse Practitioner

## 2023-11-21 ENCOUNTER — Telehealth: Payer: Self-pay | Admitting: Nurse Practitioner

## 2023-11-21 DIAGNOSIS — R3129 Other microscopic hematuria: Secondary | ICD-10-CM

## 2023-11-21 NOTE — Telephone Encounter (Signed)
Referral placed.  FYI to PCP

## 2023-11-21 NOTE — Telephone Encounter (Signed)
-----   Message from [facility] T sent at 11/21/2023  2:10 PM EDT ----- Last read by [name] at 11:58AM on 11/21/2023.  Pt would like [place] location.

## 2023-11-21 NOTE — Telephone Encounter (Signed)
 Noted! Thank you

## 2023-11-28 ENCOUNTER — Encounter: Payer: Self-pay | Admitting: Urology

## 2023-11-28 ENCOUNTER — Ambulatory Visit: Admitting: Urology

## 2023-11-28 VITALS — BP 133/80 | HR 73 | Ht 69.5 in | Wt 220.0 lb

## 2023-11-28 DIAGNOSIS — R310 Gross hematuria: Secondary | ICD-10-CM | POA: Diagnosis not present

## 2023-11-28 NOTE — Progress Notes (Signed)
 I, Anthony [name], acting as a scribe for Anthony Altes, MD., have documented all relevant documentation on the behalf of Anthony Altes, MD, as directed by Anthony Altes, MD while in the presence of Anthony Altes, MD.  11/28/2023 7:09 PM   Anthony [name] [birth year] [id number]  Referring provider: Eden Emms, NP [address] [place],  [place] [address]  Chief Complaint  Patient presents with   hyerplasia    HPI: Anthony [name] is a 78 y.o. male referred for evaluation of recurrent gross hematuria. He presents today with his son.   Approximately 2 months ago, began to have intermittent episodes of gross hematuria. His urine was described as red with clots. He had no bothersome lower urinary tract symptoms or flank, abdominal, pelvic pain. No dysuria. He is not on anticoagulant/antiplatelet therapy.  He states when these episodes occur, they will usually last for 2-3 days and resolved. He will have no symptoms for 1 week and then will typically have another episode.  Prior history of tobacco use. He smoked approximately half-pack per day for 50 years and quit 10-15 years ago.  The last creatinine was September 2024 which was 0.91  Prior history of TURP December 2024 by Dr Anthony [name]; 43 grams resected with benign pathology. Follow-up urinalysis 11/19/23 showed 11-20 RBC and uric acid crystals.    PMH: Past Medical History:  Diagnosis Date   Aortic atherosclerosis ([name]) 02/28/2020   By CT   Barrett's esophagus 12/08/2015   By EGD 2010 Anthony [name])    BPH with obstruction/lower urinary tract symptoms 2014   s/p TURP   COPD (chronic obstructive pulmonary disease) ([name]) 02/28/2020   By CT: diffuse bronchial wall thickening with mild centrilobular and paraseptal emphysema (01/2020)   Coronary artery calcification seen on CAT scan 02/28/2020   3v CAD - calcified atherosclerotic plaque in the left main, left anterior descending and left circumflex coronary arteries  (01/2020)   Diabetes mellitus type 2 with complications ([name])    completed DMSE   DKA (diabetic ketoacidoses) 01/2010   "in coma"   Ex-smoker 01/21/2020   GERD (gastroesophageal reflux disease)    HTN (hypertension)    Hyperlipidemia    controlled with medicine   Laceration of right hand without foreign body 07/03/2023   Osteoarthritis    Rheumatic fever    maybe as child    Surgical History: Past Surgical History:  Procedure Laterality Date   ABI  12/2013   WNL   APPENDECTOMY     CATARACT EXTRACTION W/PHACO Right 12/01/2020   Procedure: CATARACT EXTRACTION PHACO AND INTRAOCULAR LENS PLACEMENT (IOC)  RIGHT VIVITY TORIC LENS DIABETIC;  Surgeon: Lockie Mola, MD;  Location: [facility];  Service: Ophthalmology;  Laterality: Right;  6.93 1:12.8 9.5%   CATARACT EXTRACTION W/PHACO Left 12/15/2020   Procedure: CATARACT EXTRACTION PHACO AND INTRAOCULAR LENS PLACEMENT (IOC)  VIVITY TORIC LENS DIABETIC LEFT EYE;  Surgeon: Lockie Mola, MD;  Location: [facility];  Service: Ophthalmology;  Laterality: Left;  3.47 1:03.8 5.5%   CHOLECYSTECTOMY     years ago   COLONOSCOPY  01/2009   TA, rec rpt 5 yrs Anthony [name])   COLONOSCOPY  01/2018   TAx2, diverticulosis, int hemorrhoids, rpt 5 yrs ([name])   ESOPHAGOGASTRODUODENOSCOPY  01/2009   biopsy with Barrett's   ESOPHAGOGASTRODUODENOSCOPY  01/2018   WNL - no barrett's ([name])   INGUINAL HERNIA REPAIR Bilateral 03/04/2020   Procedure: LAPAROSCOPIC BILATERAL INGUINAL HERNIA REPAIR WITH MESH;  Surgeon: Abigail Miyamoto, MD;  Location: [facility] OR;  Service: General;  Laterality: Bilateral;   LAPAROSCOPIC APPENDECTOMY  07/05/2011   Procedure: APPENDECTOMY LAPAROSCOPIC;  Surgeon: Almond Lint, MD;  Location: [facility] OR;  Service: General;  Laterality: N/A;   MENISCUS REPAIR Right 2018   Jodi Geralds @ [facility]   TRANSURETHRAL RESECTION OF PROSTATE N/A 07/28/2013   Procedure: TRANSURETHRAL RESECTION OF THE PROSTATE WITH  GYRUS INSTRUMENTS;  Surgeon: Garnett Farm, MD   VASECTOMY     WISDOM TOOTH EXTRACTION      Home Medications:  Allergies as of 11/28/2023   No Known Allergies      Medication List        Accurate as of November 28, 2023  7:09 PM. If you have any questions, ask your nurse or doctor.          amLODipine 5 MG tablet Commonly known as: NORVASC Take 1 tablet (5 mg total) by mouth daily. For blood pressure   aspirin EC 81 MG tablet Take 1 tablet (81 mg total) by mouth daily. Swallow whole.   b complex vitamins tablet Take 1 tablet by mouth daily.   Dexcom G7 Sensor Misc by Does not apply route.   donepezil 5 MG tablet Commonly known as: ARICEPT Take 1 tablet (5 mg total) by mouth at bedtime.   GLUCOSE PO Take 1 tablet by mouth as needed.   Gvoke HypoPen 1-Pack 1 MG/0.2ML Soaj Generic drug: Glucagon Inject under skin 0.2 mL as needed for hypoglycemia   Insulin Degludec FlexTouch 200 UNIT/ML Sopn Inject 34 Units into the skin daily.   Insulin Pen Needle 31G X 5 MM Misc Commonly known as: Advocate Insulin Pen Needles Use 3x a day   metFORMIN 500 MG 24 hr tablet Commonly known as: GLUCOPHAGE-XR TAKE 1 TABLET BY MOUTH TWICE DAILY WITH A MEAL   NovoLOG FlexPen 100 UNIT/ML FlexPen Generic drug: insulin aspart Inject 12-18 Units into the skin 3 (three) times daily before meals. Per sliding scale: 10 units 100-200, 15 units 200-300, 18 units >300 cbg   olmesartan-hydrochlorothiazide 40-25 MG tablet Commonly known as: BENICAR HCT Take 1 tablet by mouth daily.   OneTouch Ultra test strip Generic drug: glucose blood USE TO check blood glucose four times daily AS DIRECTED   pantoprazole 40 MG tablet Commonly known as: PROTONIX Take 1 tablet (40 mg total) by mouth daily.   simvastatin 20 MG tablet Commonly known as: ZOCOR Take 1 tablet (20 mg total) by mouth at bedtime.   tadalafil 20 MG tablet Commonly known as: CIALIS Take 0.5-1 tablets (10-20 mg total) by mouth  every other day as needed.   TYLENOL 500 MG tablet Generic drug: acetaminophen Take 1 tablet (500 mg total) by mouth in the morning and at bedtime.        Allergies: No Known Allergies  Family History: Family History  Problem Relation Age of Onset   Lymphoma Mother        Non-hodgkins   Coronary artery disease Mother    Heart disease Mother    Cancer Father    Alcohol abuse Father    Diabetes Brother    Pancreatic cancer Maternal Uncle    Diabetes Brother    Diabetes Brother    Diabetes Other    Colon cancer Neg Hx    Lung cancer Neg Hx    Esophageal cancer Neg Hx    Rectal cancer Neg Hx    Stomach cancer Neg Hx  Social History:  reports that he quit smoking about 12 years ago. His smoking use included cigarettes. He started smoking about 62 years ago. He has a 15 pack-year smoking history. He has never used smokeless tobacco. He reports current alcohol use of about 7.0 standard drinks of alcohol per week. He reports that he does not use drugs.   Physical Exam: BP 133/80   Pulse 73   Ht 5' 9.5" (1.765 m)   Wt 220 lb (99.8 kg)   BMI 32.02 kg/m   Constitutional:  Alert and oriented, No acute distress. HEENT: [place] AT, moist mucus membranes.  Trachea midline, no masses. Cardiovascular: No clubbing, cyanosis, or edema. Respiratory: Normal respiratory effort, no increased work of breathing. GI: Abdomen is soft, nontender, nondistended, no abdominal masses Skin: No rashes, bruises or suspicious lesions. Neurologic: Grossly intact, no focal deficits, moving all 4 extremities. Psychiatric: Normal mood and affect.  Assessment & Plan:    1.  Gross hematuria AUA risk stratification: High We discussed the recommended evaluation of high risk hematuria which consist of CT urogram and cystoscopy.  The procedures were discussed in detail and he/she has elected to proceed with further evaluation All questions were answered CTU order placed and cystoscopy was  scheduled  [facility] [address], [address] [place], [place] [address] [phone]

## 2023-12-10 ENCOUNTER — Ambulatory Visit
Admission: RE | Admit: 2023-12-10 | Discharge: 2023-12-10 | Disposition: A | Discharge status: Home / Self Care | Source: Ambulatory Visit | Attending: Urology | Admitting: Urology

## 2023-12-10 DIAGNOSIS — R310 Gross hematuria: Secondary | ICD-10-CM | POA: Diagnosis not present

## 2023-12-10 DIAGNOSIS — R9341 Abnormal radiologic findings on diagnostic imaging of renal pelvis, ureter, or bladder: Secondary | ICD-10-CM | POA: Diagnosis not present

## 2023-12-10 DIAGNOSIS — R319 Hematuria, unspecified: Secondary | ICD-10-CM | POA: Diagnosis not present

## 2023-12-10 DIAGNOSIS — Z9049 Acquired absence of other specified parts of digestive tract: Secondary | ICD-10-CM | POA: Diagnosis not present

## 2023-12-10 LAB — POCT I-STAT, CHEM 8
BUN: 21 mg/dL (ref 8–23)
Calcium, Ion: 1.18 mmol/L (ref 1.15–1.40)
Chloride: 100 mmol/L (ref 98–111)
Creatinine, Ser: 1.2 mg/dL (ref 0.61–1.24)
Glucose, Bld: 235 mg/dL — ABNORMAL HIGH (ref 70–99)
HCT: 37 % — ABNORMAL LOW (ref 39.0–52.0)
Hemoglobin: 12.6 g/dL — ABNORMAL LOW (ref 13.0–17.0)
Potassium: 3.5 mmol/L (ref 3.5–5.1)
Sodium: 137 mmol/L (ref 135–145)
TCO2: 26 mmol/L (ref 22–32)

## 2023-12-10 MED ORDER — SODIUM CHLORIDE 0.9 % IV SOLN: INTRAVENOUS | Status: DC

## 2023-12-10 MED ORDER — IOHEXOL 300 MG/ML  SOLN
125.0000 mL | Freq: Once | INTRAMUSCULAR | Status: AC | PRN
  Administered 2023-12-10: 125 mL via INTRAVENOUS

## 2023-12-11 ENCOUNTER — Encounter: Payer: Self-pay | Admitting: *Deleted

## 2023-12-13 ENCOUNTER — Encounter: Payer: Self-pay | Admitting: Urology

## 2023-12-19 ENCOUNTER — Encounter: Payer: Self-pay | Admitting: Family Medicine

## 2023-12-19 ENCOUNTER — Ambulatory Visit (INDEPENDENT_AMBULATORY_CARE_PROVIDER_SITE_OTHER): Admitting: Family Medicine

## 2023-12-19 VITALS — BP 152/82 | HR 84 | Temp 98.2°F | Ht 69.5 in | Wt 213.2 lb

## 2023-12-19 DIAGNOSIS — I10 Essential (primary) hypertension: Secondary | ICD-10-CM

## 2023-12-19 DIAGNOSIS — E11649 Type 2 diabetes mellitus with hypoglycemia without coma: Secondary | ICD-10-CM | POA: Diagnosis not present

## 2023-12-19 DIAGNOSIS — G3184 Mild cognitive impairment, so stated: Secondary | ICD-10-CM | POA: Diagnosis not present

## 2023-12-19 DIAGNOSIS — N3289 Other specified disorders of bladder: Secondary | ICD-10-CM

## 2023-12-19 DIAGNOSIS — Z794 Long term (current) use of insulin: Secondary | ICD-10-CM | POA: Diagnosis not present

## 2023-12-19 MED ORDER — DONEPEZIL HCL 10 MG PO TABS
10.0000 mg | ORAL_TABLET | Freq: Every day | ORAL | 1 refills | Status: DC
Start: 2023-12-19 — End: 2024-06-05

## 2023-12-19 NOTE — Progress Notes (Signed)
 Ph: [phone] Fax: [phone]   Patient ID: Anthony [name], male    DOB: Aug 21, 1946, 78 y.o.   MRN: [id number]  This visit was conducted in person.  BP (!) 152/82 (BP Location: Right Arm, Cuff Size: Large)   Pulse 84   Temp 98.2 F (36.8 C) (Oral)   Ht 5' 9.5" (1.765 m)   Wt 213 lb 4 oz (96.7 kg)   SpO2 94%   BMI 31.04 kg/m    CC: 6 wk DM f/u visit  Subjective:   HPI: Anthony [name] is a 78 y.o. male presenting on 12/19/2023 for Medical Management of Chronic Issues (Here for 6 wk DM f/u. Pt accompanied by wife, Abe Abed and s-i-l, Don. 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 He has memory issues himself.  MCI - on aricept  5mg  nightly.   Recently underwent hematuria CT showing small anterior bladder mass ([place]). Planned cystoscopy on 01/16/2024.  DM - does regularly check sugars brings CGM. Compliant with antihyperglycemic regimen which includes: Tresiba  34u daily, metformin  XR 500mg  bid, novolog  flexpen - not using consistently. [written as 10-18u with meals with SSI (10u 100-200, 15u 200-300, 18u >300) - he doesn't know how he is taking insulin ].  Notes fewer low sugars/hypoglycemic symptoms but still happening. Denies paresthesias, blurry vision. Last diabetic eye exam 10/2022 - DUE. Glucometer brand: one-touch. Last foot exam: 10/2023 with podiatry - update today. DSME: has declined. CGM data: dexcom G7.  30 day average sugar: 235, time in range 28%, hypoglycemia <1%, stage 1 hyperglycemia 28%, stage 2 hyperglycemia 43%.  Lab Results  Component Value Date   HGBA1C 7.9 (A) 11/07/2023   Diabetic Foot Exam - Simple   No data filed    Lab Results  Component Value Date   MICROALBUR 8.5 (H) 05/07/2023     HTN - BP elevated on amlodipine  5mg  daily, and benicar  hydrochlorothiazide  40/25mg  daily. Memory trouble contributes to occ missed doses. No med changes indicated for this reason.      Relevant past medical, surgical, family and social history reviewed and  updated as indicated. Interim medical history since our last visit reviewed. Allergies and medications reviewed and updated. Outpatient Medications Prior to Visit  Medication Sig Dispense Refill   acetaminophen  (TYLENOL ) 500 MG tablet Take 1 tablet (500 mg total) by mouth in the morning and at bedtime.     amLODipine  (NORVASC ) 5 MG tablet Take 1 tablet (5 mg total) by mouth daily. For blood pressure 90 tablet 2   aspirin  EC 81 MG tablet Take 1 tablet (81 mg total) by mouth daily. Swallow whole. 30 tablet 12   b complex vitamins tablet Take 1 tablet by mouth daily.     Continuous Blood Gluc Sensor (DEXCOM G7 SENSOR) MISC by Does not apply route.     Dextrose , Diabetic Use, (GLUCOSE PO) Take 1 tablet by mouth as needed.     GVOKE HYPOPEN  1-PACK 1 MG/0.2ML SOAJ Inject under skin 0.2 mL as needed for hypoglycemia 0.2 mL 11   insulin  aspart (NOVOLOG  FLEXPEN) 100 UNIT/ML FlexPen Inject 12-18 Units into the skin 3 (three) times daily before meals. Per sliding scale: 10 units 100-200, 15 units 200-300, 18 units >300 cbg 15 mL 1   Insulin  Pen Needle (ADVOCATE INSULIN  PEN NEEDLES) 31G X 5 MM MISC Use 3x a day 300 each 3   metFORMIN  (GLUCOPHAGE -XR) 500 MG 24 hr tablet TAKE 1 TABLET BY MOUTH TWICE DAILY WITH A MEAL 180 tablet 3   olmesartan -hydrochlorothiazide  (  BENICAR  HCT) 40-25 MG tablet Take 1 tablet by mouth daily. 90 tablet 2   ONETOUCH ULTRA test strip USE TO check blood glucose four times daily AS DIRECTED 150 strip 3   pantoprazole  (PROTONIX ) 40 MG tablet Take 1 tablet (40 mg total) by mouth daily. 90 tablet 2   simvastatin  (ZOCOR ) 20 MG tablet Take 1 tablet (20 mg total) by mouth at bedtime. 90 tablet 2   tadalafil  (CIALIS ) 20 MG tablet Take 0.5-1 tablets (10-20 mg total) by mouth every other day as needed. 5 tablet 3   donepezil  (ARICEPT ) 5 MG tablet Take 1 tablet (5 mg total) by mouth at bedtime. 90 tablet 2   Insulin  Degludec FlexTouch 200 UNIT/ML SOPN Inject 34 Units into the skin daily. 9 mL 3    Insulin  Degludec FlexTouch 200 UNIT/ML SOPN Inject 30 Units into the skin daily.     No facility-administered medications prior to visit.     Per HPI unless specifically indicated in ROS section below Review of Systems  Objective:  BP (!) 152/82 (BP Location: Right Arm, Cuff Size: Large)   Pulse 84   Temp 98.2 F (36.8 C) (Oral)   Ht 5' 9.5" (1.765 m)   Wt 213 lb 4 oz (96.7 kg)   SpO2 94%   BMI 31.04 kg/m   Wt Readings from Last 3 Encounters:  12/19/23 213 lb 4 oz (96.7 kg)  11/28/23 220 lb (99.8 kg)  11/19/23 215 lb 12.8 oz (97.9 kg)      Physical Exam Vitals and nursing note reviewed.  Constitutional:      Appearance: Normal appearance. He is not ill-appearing.     Comments: Slowed gait  HENT:     Mouth/Throat:     Pharynx: Oropharynx is clear. No oropharyngeal exudate or posterior oropharyngeal erythema.  Eyes:     Extraocular Movements: Extraocular movements intact.     Pupils: Pupils are equal, round, and reactive to light.  Cardiovascular:     Rate and Rhythm: Normal rate and regular rhythm.     Pulses: Normal pulses.     Heart sounds: Normal heart sounds. No murmur heard. Pulmonary:     Effort: Pulmonary effort is normal. No respiratory distress.     Breath sounds: No wheezing or rhonchi.     Comments: Coarse crackles bibasilarly Musculoskeletal:     Right lower leg: No edema.     Left lower leg: No edema.  Skin:    General: Skin is warm and dry.  Neurological:     Mental Status: He is alert.  Psychiatric:        Mood and Affect: Mood normal.        Behavior: Behavior normal.       Results for orders placed or performed during the hospital encounter of 12/10/23  I-STAT, chem 8   Collection Time: 12/10/23  2:30 PM  Result Value Ref Range   Sodium 137 135 - 145 mmol/L   Potassium 3.5 3.5 - 5.1 mmol/L   Chloride 100 98 - 111 mmol/L   BUN 21 8 - 23 mg/dL   Creatinine, Ser 1.61 0.61 - 1.24 mg/dL   Glucose, Bld 096 (H) 70 - 99 mg/dL   Calcium, Ion  0.45 4.09 - 1.40 mmol/L   TCO2 26 22 - 32 mmol/L   Hemoglobin 12.6 (L) 13.0 - 17.0 g/dL   HCT 81.1 (L) 91.4 - 78.2 %    Assessment & Plan:   Problem List Items Addressed This Visit  Hypertension   Chronic, uncontrolled in setting of likely missed doses as he forgets to take meds - no med changes indicated at this time.      MCI (mild cognitive impairment) with memory loss   Tolerating aricept  - increase to 10mg  nightly.  Recommend limit driving.       Relevant Medications   donepezil  (ARICEPT ) 10 MG tablet   Type 2 diabetes mellitus with hypoglycemia ([name]) - Primary   Chronic insulin  dependent diabetes, memory deficits complicate care. Family is now regularly helping with benefit. No low sugars recently based on CGM readings which were reviewed. Persistent hyperglycemia - they dropped Tresiba  from 34u daily to 26 units daily - recommend titrate up to 30u.  Continue novolog  per SSI (10u for 100-200, 15u for 200-300, 18u for >300).  Discussed novolog  dosing and importance of trying to time mealtimes RTC 6 wks DM f/u visit.       Relevant Medications   Insulin  Degludec FlexTouch 200 UNIT/ML SOPN   Bladder mass   Recent hematuria - referred to urology with CT urogram showing small anterior bladder mass. Recommend keep cystoscopy scheduled for next month [facility])        Meds ordered this encounter  Medications   donepezil  (ARICEPT ) 10 MG tablet    Sig: Take 1 tablet (10 mg total) by mouth at bedtime.    Dispense:  90 tablet    Refill:  1    Note higher dose    No orders of the defined types were placed in this encounter.   Patient Instructions  Increase long acting insulin  tresiba  to 30 units daily. Continue fast acting insulin  novolog  as same sliding scale: 10u 100-200, 15u 200-300, 18u >300.  Try to space out fasting acting insulin  / mealtimes to at least 4-6 hours apart.  Increase aricept  (donepezil ) to 10mg  daily.  Keep cystoscopy appt with Dr Cherylene Corrente for  next month.  Return to see me in 6 weeks for follow up diabetes   Follow up plan: Return in about 6 weeks (around 01/30/2024), or if symptoms worsen or fail to improve, for follow up visit.  Claire Crick, MD

## 2023-12-19 NOTE — Patient Instructions (Addendum)
 Increase long acting insulin  tresiba  to 30 units daily. Continue fast acting insulin  novolog  as same sliding scale: 10u 100-200, 15u 200-300, 18u >300.  Try to space out fasting acting insulin  / mealtimes to at least 4-6 hours apart.  Increase aricept  (donepezil ) to 10mg  daily.  Keep cystoscopy appt with Dr Cherylene Corrente for next month.  Return to see me in 6 weeks for follow up diabetes

## 2023-12-23 ENCOUNTER — Encounter: Payer: Self-pay | Admitting: Family Medicine

## 2023-12-23 DIAGNOSIS — N3289 Other specified disorders of bladder: Secondary | ICD-10-CM | POA: Insufficient documentation

## 2023-12-23 NOTE — Assessment & Plan Note (Signed)
 Recent hematuria - referred to urology with CT urogram showing small anterior bladder mass. Recommend keep cystoscopy scheduled for next month [facility])

## 2023-12-23 NOTE — Assessment & Plan Note (Addendum)
 Chronic insulin  dependent diabetes, memory deficits complicate care. Family is now regularly helping with benefit. No low sugars recently based on CGM readings which were reviewed. Persistent hyperglycemia - they dropped Tresiba  from 34u daily to 26 units daily - recommend titrate up to 30u.  Continue novolog  per SSI (10u for 100-200, 15u for 200-300, 18u for >300).  Discussed novolog  dosing and importance of trying to time mealtimes RTC 6 wks DM f/u visit.

## 2023-12-23 NOTE — Assessment & Plan Note (Signed)
 Tolerating aricept  - increase to 10mg  nightly.  Recommend limit driving.

## 2023-12-23 NOTE — Assessment & Plan Note (Signed)
 Chronic, uncontrolled in setting of likely missed doses as he forgets to take meds - no med changes indicated at this time.

## 2024-01-16 ENCOUNTER — Ambulatory Visit: Admitting: Urology

## 2024-01-16 VITALS — BP 156/84 | HR 80 | Ht 72.0 in | Wt 213.0 lb

## 2024-01-16 DIAGNOSIS — N329 Bladder disorder, unspecified: Secondary | ICD-10-CM | POA: Diagnosis not present

## 2024-01-16 DIAGNOSIS — R310 Gross hematuria: Secondary | ICD-10-CM

## 2024-01-16 LAB — URINALYSIS, COMPLETE
Bilirubin, UA: NEGATIVE
Glucose, UA: NEGATIVE
Ketones, UA: NEGATIVE
Leukocytes,UA: NEGATIVE
Nitrite, UA: NEGATIVE
RBC, UA: NEGATIVE
Specific Gravity, UA: 1.025 (ref 1.005–1.030)
Urobilinogen, Ur: 0.2 mg/dL (ref 0.2–1.0)
pH, UA: 6 (ref 5.0–7.5)

## 2024-01-16 LAB — MICROSCOPIC EXAMINATION: Epithelial Cells (non renal): >10 /HPF — AB (ref 0–10)

## 2024-01-21 ENCOUNTER — Other Ambulatory Visit: Payer: Self-pay | Admitting: Family Medicine

## 2024-01-21 DIAGNOSIS — Z794 Long term (current) use of insulin: Secondary | ICD-10-CM

## 2024-01-22 ENCOUNTER — Other Ambulatory Visit: Payer: Self-pay

## 2024-01-22 ENCOUNTER — Other Ambulatory Visit: Payer: Self-pay | Admitting: Urology

## 2024-01-22 DIAGNOSIS — N329 Bladder disorder, unspecified: Secondary | ICD-10-CM

## 2024-01-22 DIAGNOSIS — R310 Gross hematuria: Secondary | ICD-10-CM

## 2024-01-22 NOTE — Progress Notes (Signed)
 01/16/2024 12:30 PM   Anthony [name] [birth year] [id number]  Referring provider: Claire Crick, MD [address] [place],  [place] [address]  Chief Complaint  Patient presents with   Cysto    HPI: 78 y.o. male with history of recurrent gross hematuria.  CT showed soft tissue filling defect anterior bladder and cystoscopy remarkable for abnormal appearing tissue in his area however visualization was suboptimal secondary to prostatic urethral bleeding.  He is scheduled for cystoscopy under anesthesia with bladder biopsy/fulguration versus TURBT.   PMH: Past Medical History:  Diagnosis Date   Aortic atherosclerosis ([name]) 02/28/2020   By CT   Barrett's esophagus 12/08/2015   By EGD 2010 Adan Holms)    BPH with obstruction/lower urinary tract symptoms 2014   s/p TURP   COPD (chronic obstructive pulmonary disease) ([name]) 02/28/2020   By CT: diffuse bronchial wall thickening with mild centrilobular and paraseptal emphysema (01/2020)   Coronary artery calcification seen on CAT scan 02/28/2020   3v CAD - calcified atherosclerotic plaque in the left main, left anterior descending and left circumflex coronary arteries (01/2020)   Diabetes mellitus type 2 with complications ([name])    completed DMSE   DKA (diabetic ketoacidoses) 01/2010   "in coma"   Ex-smoker 01/21/2020   GERD (gastroesophageal reflux disease)    HTN (hypertension)    Hyperlipidemia    controlled with medicine   Laceration of right hand without foreign body 07/03/2023   Osteoarthritis    Rheumatic fever    maybe as child    Surgical History: Past Surgical History:  Procedure Laterality Date   ABI  12/2013   WNL   APPENDECTOMY     CATARACT EXTRACTION W/PHACO Right 12/01/2020   Procedure: CATARACT EXTRACTION PHACO AND INTRAOCULAR LENS PLACEMENT (IOC)  RIGHT VIVITY TORIC LENS DIABETIC;  Surgeon: Annell Kidney, MD;  Location: [facility];  Service: Ophthalmology;  Laterality: Right;   6.93 1:12.8 9.5%   CATARACT EXTRACTION W/PHACO Left 12/15/2020   Procedure: CATARACT EXTRACTION PHACO AND INTRAOCULAR LENS PLACEMENT (IOC)  VIVITY TORIC LENS DIABETIC LEFT EYE;  Surgeon: Annell Kidney, MD;  Location: [facility];  Service: Ophthalmology;  Laterality: Left;  3.47 1:03.8 5.5%   CHOLECYSTECTOMY     years ago   COLONOSCOPY  01/2009   TA, rec rpt 5 yrs Adan Holms)   COLONOSCOPY  01/2018   TAx2, diverticulosis, int hemorrhoids, rpt 5 yrs ([name])   ESOPHAGOGASTRODUODENOSCOPY  01/2009   biopsy with Barrett's   ESOPHAGOGASTRODUODENOSCOPY  01/2018   WNL - no barrett's ([name])   INGUINAL HERNIA REPAIR Bilateral 03/04/2020   Procedure: LAPAROSCOPIC BILATERAL INGUINAL HERNIA REPAIR WITH MESH;  Surgeon: Oza Blumenthal, MD;  Location: [facility] OR;  Service: General;  Laterality: Bilateral;   LAPAROSCOPIC APPENDECTOMY  07/05/2011   Procedure: APPENDECTOMY LAPAROSCOPIC;  Surgeon: Lockie Rima, MD;  Location: [facility] OR;  Service: General;  Laterality: N/A;   MENISCUS REPAIR Right 2018   Neil Balls @ [facility]   TRANSURETHRAL RESECTION OF PROSTATE N/A 07/28/2013   Procedure: TRANSURETHRAL RESECTION OF THE PROSTATE WITH GYRUS INSTRUMENTS;  Surgeon: Mark C Ottelin, MD   VASECTOMY     WISDOM TOOTH EXTRACTION      Home Medications:  Allergies as of 01/16/2024   No Known Allergies      Medication List        Accurate as of Jan 16, 2024 11:59 PM. If you have any questions, ask your nurse or doctor.  amLODipine  5 MG tablet Commonly known as: NORVASC  Take 1 tablet (5 mg total) by mouth daily. For blood pressure   aspirin  EC 81 MG tablet Take 1 tablet (81 mg total) by mouth daily. Swallow whole.   b complex vitamins tablet Take 1 tablet by mouth daily.   Dexcom G7 Sensor Misc by Does not apply route.   donepezil  10 MG tablet Commonly known as: ARICEPT  Take 1 tablet (10 mg total) by mouth at bedtime.   GLUCOSE PO Take 1 tablet by mouth as  needed.   Gvoke HypoPen  1-Pack 1 MG/0.2ML Soaj Generic drug: Glucagon  Inject under skin 0.2 mL as needed for hypoglycemia   Insulin  Degludec FlexTouch 200 UNIT/ML Sopn Inject 30 Units into the skin daily.   Insulin  Pen Needle 31G X 5 MM Misc Commonly known as: Advocate Insulin  Pen Needles Use 3x a day   metFORMIN  500 MG 24 hr tablet Commonly known as: GLUCOPHAGE -XR TAKE 1 TABLET BY MOUTH TWICE DAILY WITH A MEAL   NovoLOG  FlexPen 100 UNIT/ML FlexPen Generic drug: insulin  aspart Inject 12-18 Units into the skin 3 (three) times daily before meals. Per sliding scale: 10 units 100-200, 15 units 200-300, 18 units >300 cbg   olmesartan -hydrochlorothiazide  40-25 MG tablet Commonly known as: BENICAR  HCT Take 1 tablet by mouth daily.   OneTouch Ultra test strip Generic drug: glucose blood USE TO check blood glucose four times daily AS DIRECTED   pantoprazole  40 MG tablet Commonly known as: PROTONIX  Take 1 tablet (40 mg total) by mouth daily.   simvastatin  20 MG tablet Commonly known as: ZOCOR  Take 1 tablet (20 mg total) by mouth at bedtime.   tadalafil  20 MG tablet Commonly known as: CIALIS  Take 0.5-1 tablets (10-20 mg total) by mouth every other day as needed.   TYLENOL  500 MG tablet Generic drug: acetaminophen  Take 1 tablet (500 mg total) by mouth in the morning and at bedtime.        Allergies: No Known Allergies  Family History: Family History  Problem Relation Age of Onset   Lymphoma Mother        Non-hodgkins   Coronary artery disease Mother    Heart disease Mother    Cancer Father    Alcohol abuse Father    Diabetes Brother    Pancreatic cancer Maternal Uncle    Diabetes Brother    Diabetes Brother    Diabetes Other    Colon cancer Neg Hx    Lung cancer Neg Hx    Esophageal cancer Neg Hx    Rectal cancer Neg Hx    Stomach cancer Neg Hx     Social History:  reports that he quit smoking about 12 years ago. His smoking use included cigarettes. He  started smoking about 62 years ago. He has a 15 pack-year smoking history. He has never used smokeless tobacco. He reports current alcohol use of about 7.0 standard drinks of alcohol per week. He reports that he does not use drugs.   Physical Exam: BP (!) 156/84   Pulse 80   Ht 6' (1.829 m)   Wt 213 lb (96.6 kg)   BMI 28.89 kg/m   Constitutional:  Alert and oriented, No acute distress. HEENT: [place] AT, moist mucus membranes.  Trachea midline, no masses. Respiratory: Normal respiratory effort, no increased work of breathing. Psychiatric: Normal mood and affect.   Pertinent Imaging: CT images were personally reviewed and interpreted  CT HEMATURIA WORKUP  Narrative CLINICAL DATA:  hematuria  EXAM: CT  ABDOMEN AND PELVIS WITHOUT AND WITH CONTRAST  TECHNIQUE: Multidetector CT imaging of the abdomen and pelvis was performed following the standard protocol before and following the bolus administration of intravenous contrast.  RADIATION DOSE REDUCTION: This exam was performed according to the departmental dose-optimization program which includes automated exposure control, adjustment of the mA and/or kV according to patient size and/or use of iterative reconstruction technique.  CONTRAST:  OMNIPAQUE  IOHEXOL  300 MG/ML  SOLN  COMPARISON:  CT abdomen and pelvis 01/20/2020.  FINDINGS: Lower chest: No acute abnormality.  Hepatobiliary: No focal liver abnormality is seen. Status post cholecystectomy. No biliary dilatation.  Pancreas: Unremarkable. No pancreatic ductal dilatation or surrounding inflammatory changes.  Spleen: Normal in size without focal abnormality.  Adrenals/Urinary Tract: Adrenal glands are unremarkable. Subcentimeter simple cortical cysts bilaterally. Kidneys are normal, without renal calculi, focal lesion, or hydronephrosis. 8 mm soft tissue filling defect within the anterior urinary bladder (series 17, image 76).  Stomach/Bowel: Stomach is within  normal limits. Appendix not visualized. No evidence of bowel wall thickening, distention, or inflammatory changes.  Vascular/Lymphatic: Aortic atherosclerosis. No enlarged abdominal or pelvic lymph nodes.  Reproductive: Prostatomegaly.  Other: No abdominal wall hernia or abnormality. No abdominopelvic ascites.  Musculoskeletal: No acute or significant osseous findings. Multilevel degenerative changes.  IMPRESSION: 1. Subcentimeter soft tissue filling defect within the anterior urinary bladder. Correlate with cystoscopy. 2. No hydronephrosis or nephrolithiasis. 3. Prostatomegaly.   Electronically Signed By: Rox Cope M.D. On: 12/11/2023 15:32  No results found for this or any previous visit.   Assessment & Plan:    1.  Bladder lesion Scheduled for cystoscopy under anesthesia with bladder biopsy versus TURBT The procedure has been discussed in detail as per today's cystoscopy note  Geraline Knapp, MD  [facility] [address] [place], [place] [address] [phone]

## 2024-01-22 NOTE — H&P (View-Only) (Signed)
   01/22/24  CC:  Chief Complaint  Patient presents with   Cysto    HPI: Refer to prior note for/2/25.  Denies recurrent gross hematuria.  CT urogram showed bilateral simple renal cysts.  No hydronephrosis or solid renal mass.  8 mm soft tissue filling defect anterior bladder.  Blood pressure (!) 156/84, pulse 80, height 6' (1.829 m), weight 213 lb (96.6 kg). NED. A&Ox3.   No respiratory distress   Abd soft, NT, ND Normal phallus with bilateral descended testicles  Cystoscopy Procedure Note  Patient identification was confirmed, informed consent was obtained, and patient was prepped using Betadine solution.  Lidocaine  jelly was administered per urethral meatus.     Pre-Procedure: - Inspection reveals a normal caliber urethral meatus.  Procedure: The flexible cystoscope was introduced without difficulty - No urethral strictures/lesions are present. - Prominent lateral lobe enlargement prostate with hypervascularity/friability - Elevated bladder neck - Bilateral ureteral orifices identified - Bladder mucosa  reveals abnormal appearing tissue anterior bladder near bladder neck however visualization suboptimal secondary to prostatic urethral bleeding - No bladder stones - Moderate trabeculation  Retroflexion shows suboptimal visualization   Post-Procedure: - Patient tolerated the procedure well  Assessment/ Plan: Abnormal appearing tissue anterior bladder with suboptimal visualization Recommend cystoscopy under anesthesia with TURBT versus bladder biopsy/fulguration The procedure was discussed including potential risk of bleeding and infection/sepsis   Geraline Knapp, MD

## 2024-01-22 NOTE — Progress Notes (Unsigned)
 Surgical Physician Order Form [facility]  Dr. Darlynn Elam, MD  * Scheduling expectation : Next Available  *Length of Case: 30 min  *Clearance needed: no  *Anticoagulation Instructions: N/A  *Aspirin  Instructions: Hold 3 days prior  *Post-op visit Date/Instructions:  TBD  *Diagnosis: Bladder Lesion  *Procedure:  TURBT <2cm (16109); possible bladder biopsy/fulguration   Additional orders: N/A  -Admit type: OUTpatient  -Anesthesia: Choice  -VTE Prophylaxis Standing Order SCD's       Other:   -Standing Lab Orders Per Anesthesia    Lab other: UA&Urine Culture  -Standing Test orders EKG/Chest x-ray per Anesthesia       Test other:   - Medications:  Ancef  2gm IV  -Other orders:  N/A

## 2024-01-22 NOTE — Progress Notes (Signed)
   01/22/24  CC:  Chief Complaint  Patient presents with   Cysto    HPI: Refer to prior note for/2/25.  Denies recurrent gross hematuria.  CT urogram showed bilateral simple renal cysts.  No hydronephrosis or solid renal mass.  8 mm soft tissue filling defect anterior bladder.  Blood pressure (!) 156/84, pulse 80, height 6' (1.829 m), weight 213 lb (96.6 kg). NED. A&Ox3.   No respiratory distress   Abd soft, NT, ND Normal phallus with bilateral descended testicles  Cystoscopy Procedure Note  Patient identification was confirmed, informed consent was obtained, and patient was prepped using Betadine solution.  Lidocaine  jelly was administered per urethral meatus.     Pre-Procedure: - Inspection reveals a normal caliber urethral meatus.  Procedure: The flexible cystoscope was introduced without difficulty - No urethral strictures/lesions are present. - Prominent lateral lobe enlargement prostate with hypervascularity/friability - Elevated bladder neck - Bilateral ureteral orifices identified - Bladder mucosa  reveals abnormal appearing tissue anterior bladder near bladder neck however visualization suboptimal secondary to prostatic urethral bleeding - No bladder stones - Moderate trabeculation  Retroflexion shows suboptimal visualization   Post-Procedure: - Patient tolerated the procedure well  Assessment/ Plan: Abnormal appearing tissue anterior bladder with suboptimal visualization Recommend cystoscopy under anesthesia with TURBT versus bladder biopsy/fulguration The procedure was discussed including potential risk of bleeding and infection/sepsis   Geraline Knapp, MD

## 2024-01-23 ENCOUNTER — Telehealth: Payer: Self-pay

## 2024-01-23 NOTE — Progress Notes (Signed)
   [facility] Surgical Posting Form  Surgery Date: Date: 02/05/2024  Surgeon: Dr. Darlynn Elam, MD  Inpt ( No  )   Outpt (Yes)   Obs ( No  )   Diagnosis: N32.9 Bladder Lesion  -CPT: 40981, 19147, 82956  Surgery: Transurethral Resection of Bladder Tumor with Possible Bladder Biopsy and Possible Bladder Fulguration  Stop Anticoagulations: Yes, will need to hold ASA for 3 days prior  Cardiac/Medical/Pulmonary Clearance needed: no  *Orders entered into EPIC  Date: 01/23/24   *Case booked in [place]  Date: 01/23/24  *Notified pt of Surgery: Date: 01/23/24  PRE-OP UA & CX: yes, will obtain in clinic on 01/25/2024  *Placed into Prior Authorization Work [place] Date: 01/23/24  Assistant/laser/rep:No

## 2024-01-23 NOTE — Telephone Encounter (Signed)
 Per Dr. Cherylene Corrente, Patient is to be scheduled for Transurethral Resection of Bladder Tumor with Possible Bladder Biopsy and Possible Bladder Fulguration   Mr. Casagrande was contacted and possible surgical dates were discussed, Tuesday June 10th, 2025 was agreed upon for surgery.   Patient was instructed that Dr. Cherylene Corrente will require them to provide a pre-op UA & CX prior to surgery. This was ordered and scheduled drop off appointment was made for 01/25/24.    Patient was directed to call [phone] between 1-3pm the day before surgery to find out surgical arrival time.  Instructions were given not to eat or drink from midnight on the night before surgery and have a driver for the day of surgery. On the surgery day patient was instructed to enter through the Medical Mall entrance of [facility] report the Same Day Surgery desk.   Pre-Admit Testing will be in contact via phone to set up an interview with the anesthesia team to review your history and medications prior to surgery.   Reminder of this information was sent via MyChart to the patient.

## 2024-01-25 ENCOUNTER — Other Ambulatory Visit

## 2024-01-25 DIAGNOSIS — N329 Bladder disorder, unspecified: Secondary | ICD-10-CM

## 2024-01-25 LAB — URINALYSIS, COMPLETE
Bilirubin, UA: NEGATIVE
Ketones, UA: NEGATIVE
Leukocytes,UA: NEGATIVE
Nitrite, UA: NEGATIVE
Protein,UA: NEGATIVE
Specific Gravity, UA: 1.015 (ref 1.005–1.030)
Urobilinogen, Ur: 0.2 mg/dL (ref 0.2–1.0)
pH, UA: 6 (ref 5.0–7.5)

## 2024-01-25 LAB — MICROSCOPIC EXAMINATION

## 2024-01-29 ENCOUNTER — Other Ambulatory Visit: Payer: Self-pay | Admitting: Family Medicine

## 2024-01-29 DIAGNOSIS — Z794 Long term (current) use of insulin: Secondary | ICD-10-CM

## 2024-01-29 LAB — CULTURE, URINE COMPREHENSIVE

## 2024-01-29 NOTE — Telephone Encounter (Signed)
 Tresiba  FlexTouch Last filled:  12/10/23 Last OV:  12/19/23, 6 wk DM f/u Next OV:  02/08/24, 6 wk DM f/u

## 2024-01-30 ENCOUNTER — Encounter
Admission: RE | Admit: 2024-01-30 | Discharge: 2024-01-30 | Disposition: A | Discharge status: Home / Self Care | Source: Ambulatory Visit | Attending: Urology | Admitting: Urology

## 2024-01-30 ENCOUNTER — Other Ambulatory Visit: Payer: Self-pay

## 2024-01-30 VITALS — Wt 213.0 lb

## 2024-01-30 DIAGNOSIS — Z79899 Other long term (current) drug therapy: Secondary | ICD-10-CM

## 2024-01-30 DIAGNOSIS — Z01812 Encounter for preprocedural laboratory examination: Secondary | ICD-10-CM

## 2024-01-30 DIAGNOSIS — I2510 Atherosclerotic heart disease of native coronary artery without angina pectoris: Secondary | ICD-10-CM

## 2024-01-30 DIAGNOSIS — I700 Atherosclerosis of aorta: Secondary | ICD-10-CM

## 2024-01-30 DIAGNOSIS — Z794 Long term (current) use of insulin: Secondary | ICD-10-CM

## 2024-01-30 DIAGNOSIS — J449 Chronic obstructive pulmonary disease, unspecified: Secondary | ICD-10-CM

## 2024-01-30 DIAGNOSIS — Z01810 Encounter for preprocedural cardiovascular examination: Secondary | ICD-10-CM

## 2024-01-30 DIAGNOSIS — Z01818 Encounter for other preprocedural examination: Secondary | ICD-10-CM

## 2024-01-30 DIAGNOSIS — I10 Essential (primary) hypertension: Secondary | ICD-10-CM

## 2024-01-30 HISTORY — DX: Family history of other specified conditions: Z84.89

## 2024-01-30 HISTORY — DX: Gross hematuria: R31.0

## 2024-01-30 HISTORY — DX: Other specified disorders of bladder: N32.89

## 2024-01-30 HISTORY — DX: Male erectile dysfunction, unspecified: N52.9

## 2024-01-30 HISTORY — DX: Mild cognitive impairment of uncertain or unknown etiology: G31.84

## 2024-01-30 HISTORY — DX: Spondylosis without myelopathy or radiculopathy, cervical region: M47.812

## 2024-01-30 NOTE — Patient Instructions (Signed)
 Your procedure is scheduled on:02-05-24 Tuesday Report to the Registration Desk on the 1st floor of the Medical Mall.Then proceed to the 2nd floor Surgery Desk To find out your arrival time, please call [phone] between 1PM - 3PM on:02-04-24 Monday If your arrival time is 6:00 am, do not arrive before that time as the Medical Mall entrance doors do not open until 6:00 am.  REMEMBER: Instructions that are not followed completely may result in serious medical risk, up to and including death; or upon the discretion of your surgeon and anesthesiologist your surgery may need to be rescheduled.  Do not eat food OR drink any liquids after midnight the night before surgery.  No gum chewing or hard candies.  One week prior to surgery:Stop NOW (01-30-24) Stop Anti-inflammatories (NSAIDS) such as Advil, Aleve, Ibuprofen, Motrin, Naproxen, Naprosyn and Aspirin  based products such as Excedrin, Goody's Powder, BC Powder. Stop ANY OVER THE COUNTER supplements until after surgery.  You may however, continue to take Tylenol  if needed for pain up until the day of surgery.  Stop metFORMIN  (GLUCOPHAGE -XR) 2 days prior to surgery-Last dose will be on 02-02-24 Saturday  Stop 81 mg Aspirin  3 days prior to surgery-Last dose will be on 02-01-24 Friday  Continue taking all of your other prescription medications up until the day of surgery.  ON THE DAY OF SURGERY ONLY TAKE THESE MEDICATIONS WITH SIPS OF WATER : -amLODipine  (NORVASC )   Do NOT take any Insulin  the day of surgery  No Alcohol for 24 hours before or after surgery.  No Smoking including e-cigarettes for 24 hours before surgery.  No chewable tobacco products for at least 6 hours before surgery.  No nicotine patches on the day of surgery.  Do not use any "recreational" drugs for at least a week (preferably 2 weeks) before your surgery.  Please be advised that the combination of cocaine and anesthesia may have negative outcomes, up to and including  death. If you test positive for cocaine, your surgery will be cancelled.  On the morning of surgery brush your teeth with toothpaste and water , you may rinse your mouth with mouthwash if you wish. Do not swallow any toothpaste or mouthwash.  Do not wear jewelry, make-up, hairpins, clips or nail polish.  For welded (permanent) jewelry: bracelets, anklets, waist bands, etc.  Please have this removed prior to surgery.  If it is not removed, there is a chance that hospital personnel will need to cut it off on the day of surgery.  Do not wear lotions, powders, or perfumes.   Do not shave body hair from the neck down 48 hours before surgery.  Contact lenses, hearing aids and dentures may not be worn into surgery.  Do not bring valuables to the hospital. [facility] is not responsible for any missing/lost belongings or valuables.   Notify your doctor if there is any change in your medical condition (cold, fever, infection).  Wear comfortable clothing (specific to your surgery type) to the hospital.  After surgery, you can help prevent lung complications by doing breathing exercises.  Take deep breaths and cough every 1-2 hours. Your doctor may order a device called an Incentive Spirometer to help you take deep breaths. When coughing or sneezing, hold a pillow firmly against your incision with both hands. This is called "splinting." Doing this helps protect your incision. It also decreases belly discomfort.  If you are being admitted to the hospital overnight, leave your suitcase in the car. After surgery it may be  brought to your room.  In case of increased patient census, it may be necessary for you, the patient, to continue your postoperative care in the Same Day Surgery department.  If you are being discharged the day of surgery, you will not be allowed to drive home. You will need a responsible individual to drive you home and stay with you for 24 hours after surgery.   If you are  taking public transportation, you will need to have a responsible individual with you.  Please call the Pre-admissions Testing Dept. at [phone] if you have any questions about these instructions.  Surgery Visitation Policy:  Patients having surgery or a procedure may have two visitors.  Children under the age of 53 must have an adult with them who is not the patient.

## 2024-01-31 ENCOUNTER — Encounter
Admission: RE | Admit: 2024-01-31 | Discharge: 2024-01-31 | Disposition: A | Discharge status: Home / Self Care | Source: Ambulatory Visit | Attending: Urology | Admitting: Urology

## 2024-01-31 DIAGNOSIS — Z794 Long term (current) use of insulin: Secondary | ICD-10-CM | POA: Diagnosis not present

## 2024-01-31 DIAGNOSIS — R9431 Abnormal electrocardiogram [ECG] [EKG]: Secondary | ICD-10-CM | POA: Insufficient documentation

## 2024-01-31 DIAGNOSIS — Z01818 Encounter for other preprocedural examination: Secondary | ICD-10-CM | POA: Diagnosis not present

## 2024-01-31 DIAGNOSIS — I10 Essential (primary) hypertension: Secondary | ICD-10-CM | POA: Diagnosis not present

## 2024-01-31 DIAGNOSIS — Z01810 Encounter for preprocedural cardiovascular examination: Secondary | ICD-10-CM

## 2024-01-31 DIAGNOSIS — I2510 Atherosclerotic heart disease of native coronary artery without angina pectoris: Secondary | ICD-10-CM | POA: Insufficient documentation

## 2024-01-31 DIAGNOSIS — I700 Atherosclerosis of aorta: Secondary | ICD-10-CM | POA: Insufficient documentation

## 2024-01-31 DIAGNOSIS — Z79899 Other long term (current) drug therapy: Secondary | ICD-10-CM | POA: Diagnosis not present

## 2024-01-31 DIAGNOSIS — Z01812 Encounter for preprocedural laboratory examination: Secondary | ICD-10-CM | POA: Diagnosis not present

## 2024-01-31 DIAGNOSIS — E11649 Type 2 diabetes mellitus with hypoglycemia without coma: Secondary | ICD-10-CM | POA: Diagnosis not present

## 2024-01-31 DIAGNOSIS — J449 Chronic obstructive pulmonary disease, unspecified: Secondary | ICD-10-CM | POA: Diagnosis not present

## 2024-01-31 LAB — BASIC METABOLIC PANEL WITH GFR
Anion gap: 9 (ref 5–15)
BUN: 25 mg/dL — ABNORMAL HIGH (ref 8–23)
CO2: 26 mmol/L (ref 22–32)
Calcium: 8.9 mg/dL (ref 8.9–10.3)
Chloride: 103 mmol/L (ref 98–111)
Creatinine, Ser: 1.2 mg/dL (ref 0.61–1.24)
GFR, Estimated: >60 mL/min (ref >60)
Glucose, Bld: 386 mg/dL — ABNORMAL HIGH (ref 70–99)
Potassium: 3.6 mmol/L (ref 3.5–5.1)
Sodium: 138 mmol/L (ref 135–145)

## 2024-01-31 NOTE — Telephone Encounter (Signed)
 ERx

## 2024-02-05 ENCOUNTER — Encounter: Payer: Self-pay | Admitting: Urology

## 2024-02-05 ENCOUNTER — Ambulatory Visit: Payer: Self-pay | Admitting: Urgent Care

## 2024-02-05 ENCOUNTER — Ambulatory Visit
Admission: RE | Admit: 2024-02-05 | Discharge: 2024-02-05 | Disposition: A | Discharge status: Home / Self Care | Attending: Urology | Admitting: Urology

## 2024-02-05 ENCOUNTER — Encounter
Admission: RE | Disposition: A | Discharge status: Home / Self Care | Payer: Self-pay | Source: Home / Self Care | Attending: Urology

## 2024-02-05 ENCOUNTER — Other Ambulatory Visit: Payer: Self-pay

## 2024-02-05 DIAGNOSIS — N400 Enlarged prostate without lower urinary tract symptoms: Secondary | ICD-10-CM | POA: Insufficient documentation

## 2024-02-05 DIAGNOSIS — J449 Chronic obstructive pulmonary disease, unspecified: Secondary | ICD-10-CM | POA: Diagnosis not present

## 2024-02-05 DIAGNOSIS — I10 Essential (primary) hypertension: Secondary | ICD-10-CM | POA: Insufficient documentation

## 2024-02-05 DIAGNOSIS — Z01818 Encounter for other preprocedural examination: Secondary | ICD-10-CM

## 2024-02-05 DIAGNOSIS — K219 Gastro-esophageal reflux disease without esophagitis: Secondary | ICD-10-CM | POA: Insufficient documentation

## 2024-02-05 DIAGNOSIS — K2270 Barrett's esophagus without dysplasia: Secondary | ICD-10-CM | POA: Insufficient documentation

## 2024-02-05 DIAGNOSIS — M1990 Unspecified osteoarthritis, unspecified site: Secondary | ICD-10-CM | POA: Insufficient documentation

## 2024-02-05 DIAGNOSIS — R001 Bradycardia, unspecified: Secondary | ICD-10-CM | POA: Diagnosis not present

## 2024-02-05 DIAGNOSIS — I444 Left anterior fascicular block: Secondary | ICD-10-CM | POA: Insufficient documentation

## 2024-02-05 DIAGNOSIS — N3289 Other specified disorders of bladder: Secondary | ICD-10-CM | POA: Diagnosis not present

## 2024-02-05 DIAGNOSIS — N281 Cyst of kidney, acquired: Secondary | ICD-10-CM | POA: Diagnosis not present

## 2024-02-05 DIAGNOSIS — R9431 Abnormal electrocardiogram [ECG] [EKG]: Secondary | ICD-10-CM | POA: Insufficient documentation

## 2024-02-05 DIAGNOSIS — Z87891 Personal history of nicotine dependence: Secondary | ICD-10-CM | POA: Insufficient documentation

## 2024-02-05 DIAGNOSIS — E119 Type 2 diabetes mellitus without complications: Secondary | ICD-10-CM | POA: Insufficient documentation

## 2024-02-05 DIAGNOSIS — R310 Gross hematuria: Secondary | ICD-10-CM | POA: Insufficient documentation

## 2024-02-05 DIAGNOSIS — N329 Bladder disorder, unspecified: Secondary | ICD-10-CM | POA: Insufficient documentation

## 2024-02-05 HISTORY — PX: CYSTOSCOPY WITH FULGERATION: SHX6638

## 2024-02-05 HISTORY — PX: TRANSURETHRAL RESECTION OF BLADDER TUMOR: SHX2575

## 2024-02-05 HISTORY — PX: CYSTOSCOPY WITH BIOPSY: SHX5122

## 2024-02-05 LAB — GLUCOSE, CAPILLARY
Glucose-Capillary: 108 mg/dL — ABNORMAL HIGH (ref 70–99)
Glucose-Capillary: 98 mg/dL (ref 70–99)
Glucose-Capillary: 100 mg/dL — ABNORMAL HIGH (ref 70–99)

## 2024-02-05 SURGERY — TURBT (TRANSURETHRAL RESECTION OF BLADDER TUMOR)
Anesthesia: General | Site: Bladder

## 2024-02-05 MED ORDER — ONDANSETRON HCL 4 MG/2ML IJ SOLN
INTRAMUSCULAR | Status: DC | PRN
  Administered 2024-02-05: 4 mg via INTRAVENOUS

## 2024-02-05 MED ORDER — CEFAZOLIN SODIUM-DEXTROSE 2-4 GM/100ML-% IV SOLN
2.0000 g | INTRAVENOUS | Status: AC
  Administered 2024-02-05: 2 g via INTRAVENOUS

## 2024-02-05 MED ORDER — STERILE WATER FOR IRRIGATION IR SOLN
Status: DC | PRN
  Administered 2024-02-05: 1000 mL

## 2024-02-05 MED ORDER — FENTANYL CITRATE (PF) 100 MCG/2ML IJ SOLN: 25.0000 ug | INTRAMUSCULAR | Status: DC | PRN

## 2024-02-05 MED ORDER — ROCURONIUM BROMIDE 10 MG/ML (PF) SYRINGE
PREFILLED_SYRINGE | INTRAVENOUS | Status: AC
  Filled 2024-02-05: qty 10

## 2024-02-05 MED ORDER — PROPOFOL 1000 MG/100ML IV EMUL
INTRAVENOUS | Status: AC
  Filled 2024-02-05: qty 100

## 2024-02-05 MED ORDER — OXYCODONE HCL 5 MG/5ML PO SOLN: 5.0000 mg | Freq: Once | ORAL | Status: AC | PRN

## 2024-02-05 MED ORDER — DEXAMETHASONE SODIUM PHOSPHATE 10 MG/ML IJ SOLN
INTRAMUSCULAR | Status: AC
  Filled 2024-02-05: qty 1

## 2024-02-05 MED ORDER — ACETAMINOPHEN 10 MG/ML IV SOLN
INTRAVENOUS | Status: AC
  Filled 2024-02-05: qty 100

## 2024-02-05 MED ORDER — PROPOFOL 10 MG/ML IV BOLUS
INTRAVENOUS | Status: DC | PRN
  Administered 2024-02-05: 30 mg via INTRAVENOUS
  Administered 2024-02-05: 125 ug/kg/min via INTRAVENOUS
  Administered 2024-02-05: 150 mg via INTRAVENOUS

## 2024-02-05 MED ORDER — CHLORHEXIDINE GLUCONATE 0.12 % MT SOLN
OROMUCOSAL | Status: AC
  Filled 2024-02-05: qty 15

## 2024-02-05 MED ORDER — ONDANSETRON HCL 4 MG/2ML IJ SOLN: 4.0000 mg | Freq: Once | INTRAMUSCULAR | Status: DC | PRN

## 2024-02-05 MED ORDER — FENTANYL CITRATE (PF) 100 MCG/2ML IJ SOLN
INTRAMUSCULAR | Status: AC
  Filled 2024-02-05: qty 2

## 2024-02-05 MED ORDER — CEFAZOLIN SODIUM-DEXTROSE 2-4 GM/100ML-% IV SOLN
INTRAVENOUS | Status: AC
  Filled 2024-02-05: qty 100

## 2024-02-05 MED ORDER — SODIUM CHLORIDE 0.9 % IR SOLN
Status: DC | PRN
  Administered 2024-02-05: 6000 mL

## 2024-02-05 MED ORDER — ACETAMINOPHEN 10 MG/ML IV SOLN
INTRAVENOUS | Status: DC | PRN
  Administered 2024-02-05: 1000 mg via INTRAVENOUS

## 2024-02-05 MED ORDER — ACETAMINOPHEN 10 MG/ML IV SOLN: 1000.0000 mg | Freq: Once | INTRAVENOUS | Status: DC | PRN

## 2024-02-05 MED ORDER — LIDOCAINE HCL (PF) 2 % IJ SOLN
INTRAMUSCULAR | Status: AC
  Filled 2024-02-05: qty 5

## 2024-02-05 MED ORDER — OXYCODONE HCL 5 MG PO TABS
5.0000 mg | ORAL_TABLET | Freq: Once | ORAL | Status: AC | PRN
  Administered 2024-02-05: 5 mg via ORAL

## 2024-02-05 MED ORDER — MIDAZOLAM HCL 2 MG/2ML IJ SOLN
INTRAMUSCULAR | Status: AC
  Filled 2024-02-05: qty 2

## 2024-02-05 MED ORDER — CHLORHEXIDINE GLUCONATE 0.12 % MT SOLN
15.0000 mL | Freq: Once | OROMUCOSAL | Status: AC
  Administered 2024-02-05: 15 mL via OROMUCOSAL

## 2024-02-05 MED ORDER — DEXAMETHASONE SODIUM PHOSPHATE 10 MG/ML IJ SOLN
INTRAMUSCULAR | Status: DC | PRN
  Administered 2024-02-05: 10 mg via INTRAVENOUS

## 2024-02-05 MED ORDER — LIDOCAINE HCL (CARDIAC) PF 100 MG/5ML IV SOSY
PREFILLED_SYRINGE | INTRAVENOUS | Status: DC | PRN
  Administered 2024-02-05: 100 mg via INTRAVENOUS

## 2024-02-05 MED ORDER — ONDANSETRON HCL 4 MG/2ML IJ SOLN
INTRAMUSCULAR | Status: AC
  Filled 2024-02-05: qty 2

## 2024-02-05 MED ORDER — SODIUM CHLORIDE 0.9 % IV SOLN: INTRAVENOUS | Status: DC

## 2024-02-05 MED ORDER — LACTATED RINGERS IV SOLN: INTRAVENOUS | Status: DC

## 2024-02-05 MED ORDER — FENTANYL CITRATE (PF) 100 MCG/2ML IJ SOLN
INTRAMUSCULAR | Status: DC | PRN
  Administered 2024-02-05: 50 ug via INTRAVENOUS

## 2024-02-05 MED ORDER — ORAL CARE MOUTH RINSE: 15.0000 mL | Freq: Once | OROMUCOSAL | Status: AC

## 2024-02-05 MED ORDER — OXYCODONE HCL 5 MG PO TABS
ORAL_TABLET | ORAL | Status: AC
  Filled 2024-02-05: qty 1

## 2024-02-05 SURGICAL SUPPLY — 29 items
BAG DRAIN SIEMENS DORNER NS (MISCELLANEOUS) ×1 IMPLANT
BAG URINE DRAIN 2000ML AR STRL (UROLOGICAL SUPPLIES) ×1 IMPLANT
BRUSH SCRUB EZ 4% CHG (MISCELLANEOUS) IMPLANT
CATH COUDE FOLEY 2W 5CC 18FR (CATHETERS) IMPLANT
CATH FOLEY 2W COUNCIL 5CC 18FR (CATHETERS) IMPLANT
CATH FOLEY 2WAY 18X30 (CATHETERS) IMPLANT
CATH URET FLEX-TIP 2 LUMEN 10F (CATHETERS) ×1 IMPLANT
DRAPE UTILITY 15X26 TOWEL STRL (DRAPES) ×1 IMPLANT
DRSG TELFA 3X4 N-ADH STERILE (GAUZE/BANDAGES/DRESSINGS) ×1 IMPLANT
ELECTRODE LOOP 22F BIPOLAR SML (ELECTROSURGICAL) IMPLANT
ELECTRODE REM PT RTRN 9FT ADLT (ELECTROSURGICAL) ×1 IMPLANT
GLOVE BIOGEL PI IND STRL 7.5 (GLOVE) ×1 IMPLANT
GOWN STRL REUS W/ TWL LRG LVL3 (GOWN DISPOSABLE) ×2 IMPLANT
GOWN STRL REUS W/ TWL XL LVL3 (GOWN DISPOSABLE) ×1 IMPLANT
GOWN STRL REUS W/TWL XL LVL4 (GOWN DISPOSABLE) ×1 IMPLANT
GUIDEWIRE STR DUAL SENSOR (WIRE) ×2 IMPLANT
KIT TURNOVER CYSTO (KITS) ×1 IMPLANT
LOOP CUT BIPOLAR 24F LRG (ELECTROSURGICAL) IMPLANT
NDL SAFETY ECLIPSE 18X1.5 (NEEDLE) ×1 IMPLANT
PACK CYSTO AR (MISCELLANEOUS) ×1 IMPLANT
SET CYSTO W/LG BORE CLAMP LF (SET/KITS/TRAYS/PACK) ×1 IMPLANT
SET IRRIG Y TYPE TUR BLADDER L (SET/KITS/TRAYS/PACK) ×1 IMPLANT
SHEATH NAVIGATOR HD 12/14X36 (SHEATH) ×1 IMPLANT
SOL .9 NS 3000ML IRR UROMATIC (IV SOLUTION) ×2 IMPLANT
SURGILUBE 2OZ TUBE FLIPTOP (MISCELLANEOUS) ×1 IMPLANT
SYRINGE TOOMEY IRRIG 70ML (MISCELLANEOUS) ×1 IMPLANT
WATER STERILE IRR 1000ML POUR (IV SOLUTION) ×1 IMPLANT
WATER STERILE IRR 3000ML UROMA (IV SOLUTION) ×1 IMPLANT
WATER STERILE IRR 500ML POUR (IV SOLUTION) ×1 IMPLANT

## 2024-02-05 NOTE — Discharge Instructions (Signed)
 Transurethral Resection of Bladder Tumor (TURBT) or Bladder Biopsy   Definition:  Transurethral Resection of the Bladder Tumor is a surgical procedure used to diagnose and remove tumors within the bladder. T  General instructions:     Your recent bladder surgery requires very little post hospital care but some definite precautions.  Despite the fact that no skin incisions were used, the area around the bladder incisions are raw and covered with scabs to promote healing and prevent bleeding. Certain precautions are needed to insure that the scabs are not disturbed over the next 2-4 weeks while the healing proceeds.  Because the raw surface inside your bladder and the irritating effects of urine you may expect frequency of urination and/or urgency (a stronger desire to urinate) and perhaps even getting up at night more often. This will usually resolve or improve slowly over the healing period. You may see some blood in your urine over the first 6 weeks. Do not be alarmed, even if the urine was clear for a while. Get off your feet and drink lots of fluids until clearing occurs. If you start to pass clots or don't improve call us .  Diet:  You may return to your normal diet immediately. Because of the raw surface of your bladder, alcohol, spicy foods, foods high in acid and drinks with caffeine may cause irritation or frequency and should be used in moderation. To keep your urine flowing freely and avoid constipation, drink plenty of fluids during the day (8-10 glasses). Tip: Avoid cranberry juice because it is very acidic.  Activity:  Your physical activity doesn't need to be restricted. However, if you are very active, you may see some blood in the urine. We suggest that you reduce your activity under the circumstances until the bleeding has stopped.  Bowels:  It is important to keep your bowels regular during the postoperative period. Straining with bowel movements can cause bleeding. A bowel  movement every other day is reasonable. Use a mild laxative if needed, such as milk of magnesia 2-3 tablespoons, or 2 Dulcolax tablets. Call if you continue to have problems. If you had been taking narcotics for pain, before, during or after your surgery, you may be constipated. Take a laxative if necessary.    Medication:  You should resume your pre-surgery medications unless told not to. In addition you may be given an antibiotic to prevent or treat infection. Antibiotics are not always necessary. All medication should be taken as prescribed until the bottles are finished unless you are having an unusual reaction to one of the drugs.  Our office will contact you for an appointment for catheter removal on 02/06/2024   [facility] [address] [place], [place] [address] [phone]

## 2024-02-05 NOTE — Interval H&P Note (Signed)
 History and Physical Interval Note:  02/05/2024 7:11 AM  Anthony [name]  has presented today for surgery, with the diagnosis of Bladder Lesion.  The various methods of treatment have been discussed with the patient and family. After consideration of risks, benefits and other options for treatment, the patient has consented to  Procedure(s): TURBT (TRANSURETHRAL RESECTION OF BLADDER TUMOR) (N/A) CYSTOSCOPY, WITH BIOPSY (N/A) CYSTOSCOPY, WITH BLADDER FULGURATION (N/A) as a surgical intervention.  The patient's history has been reviewed, patient examined, no change in status, stable for surgery.  I have reviewed the patient's chart and labs.  Questions were answered to the patient's satisfaction.    CV:RRR Lungs:clear  Geraline Knapp

## 2024-02-05 NOTE — Transfer of Care (Signed)
 Immediate Anesthesia Transfer of Care Note  Patient: Anthony [name]  Procedure(s) Performed: TURBT (TRANSURETHRAL RESECTION OF BLADDER TUMOR) (Bladder) CYSTOSCOPY, WITH BIOPSY (Bladder) CYSTOSCOPY, WITH BLADDER FULGURATION (Bladder)  Patient Location: PACU  Anesthesia Type:General  Level of Consciousness: sedated  Airway & Oxygen Therapy: Patient Spontanous Breathing  Post-op Assessment: Report given to RN and Post -op Vital signs reviewed and stable  Post vital signs: Reviewed and stable  Last Vitals:  Vitals Value Taken Time  BP 108/68 02/05/24 0827  Temp    Pulse 58 02/05/24 0830  Resp 11 02/05/24 0830  SpO2 95 % 02/05/24 0830  Vitals shown include unfiled device data.  Last Pain:  Vitals:   02/05/24 0627  TempSrc: Temporal  PainSc: 0-No pain         Complications: There were no known notable events for this encounter.

## 2024-02-05 NOTE — Op Note (Signed)
    Preoperative diagnosis:  Gross hematuria Possible bladder neck lesion  Postoperative diagnosis:  Gross hematuria Bladder neck lesion  Procedure: Cystoscopy with transurethral resection of bladder neck lesion (~1 cm)  Surgeon: Geraline Knapp, MD  Anesthesia: General  Complications: None  Intraoperative findings:  Urethra normal in caliber without stricture Prominent BPH with hypervascularity Inflammatory tissue right bladder neck No solid or papillary bladder tumors identified Moderate bladder trabeculation  EBL: Minimal  Specimens: Bladder neck lesion  Indication: Anthony [name] is a 78 y.o. male with a history of gross hematuria.  CT urogram remarkable for a <1 cm filling defect anterior bladder.  Office cystoscopy suboptimal secondary to prostatic urethral bleeding.  He presents for cystoscopy under anesthesia with possible bladder biopsy/TURBT.  After reviewing the management options for treatment, he elected to proceed with the above surgical procedure(s). We have discussed the potential benefits and risks of the procedure, side effects of the proposed treatment, the likelihood of the patient achieving the goals of the procedure, and any potential problems that might occur during the procedure or recuperation. Informed consent has been obtained.  Description of procedure:  The patient was taken to the operating room and general anesthesia was induced.  The patient was placed in the dorsal lithotomy position, prepped and draped in the usual sterile fashion, and preoperative antibiotics were administered. A preoperative time-out was performed.   A 24 French continuous-flow resectoscope sheath with visual obturator was duplicated, placed per urethra and advanced proximally under direct vision with findings as described above.  The obturator was replaced with an Anette Barb resectoscope with loop and close inspection of the bladder was performed with no solid or papillary  lesions were identified.  Careful inspection of the anterior bladder/bladder neck region was performed and no abnormal findings identified.  There was inflammatory appearing tissue at the right bladder neck region at 7:00.  This was resected, irrigated from the bladder and sent for pathologic analysis.  There were prominent superficial vessels of the bladder neck which were cauterized as well as the resection site.  At the completion of procedure hemostasis was noted to be adequate and all specimen had been removed from the bladder.  Due to prominent BPH it was elected to place a Foley catheter overnight.  Both 66F standard and coud catheters were unable to be advanced in the bladder.  A cystoscope was then advanced in the bladder and a 0.038 Sensor wire was placed.  A 66F Councill catheter was placed over the guidewire without difficulty and the balloon inflated with 10 mL of sterile water .  After anesthetic reversal he was transported to the PACU in stable condition  Plan: Foley catheter removal in a.m. He will be notified with the pathology results   Geraline Knapp, M.D.

## 2024-02-05 NOTE — Anesthesia Postprocedure Evaluation (Signed)
 Anesthesia Post Note  Patient: Anthony [name]  Procedure(s) Performed: TURBT (TRANSURETHRAL RESECTION OF BLADDER TUMOR) (Bladder) CYSTOSCOPY, WITH BIOPSY (Bladder) CYSTOSCOPY, WITH BLADDER FULGURATION (Bladder)  Patient location during evaluation: PACU Anesthesia Type: General Level of consciousness: awake and alert, oriented and patient cooperative Pain management: pain level controlled Vital Signs Assessment: post-procedure vital signs reviewed and stable Respiratory status: spontaneous breathing, nonlabored ventilation and respiratory function stable Cardiovascular status: blood pressure returned to baseline and stable Postop Assessment: adequate PO intake Anesthetic complications: no   There were no known notable events for this encounter.   Last Vitals:  Vitals:   02/05/24 0852 02/05/24 0900  BP:  (!) 148/80  Pulse: (!) 50 (!) 48  Resp: 16 13  Temp:    SpO2: 96% 97%    Last Pain:  Vitals:   02/05/24 0900  TempSrc:   PainSc: 4                  Tambria Pfannenstiel

## 2024-02-05 NOTE — Anesthesia Preprocedure Evaluation (Addendum)
 Anesthesia Evaluation  Patient identified by MRN, date of birth, ID band Patient awake    Reviewed: Allergy & Precautions, NPO status , Patient's Chart, lab work & pertinent test results  History of Anesthesia Complications Negative for: history of anesthetic complications  Airway Mallampati: IV   Neck ROM: Full    Dental  (+) Missing, Chipped   Pulmonary COPD, former smoker (quit 2013)   Pulmonary exam normal breath sounds clear to auscultation       Cardiovascular hypertension, Normal cardiovascular exam Rhythm:Regular Rate:Normal  ECG 01/31/24:  Sinus bradycardia Left anterior fascicular block Abnormal ECG When compared with ECG of 04-Mar-2020 11:26, No significant change was found   Neuro/Psych  PSYCHIATRIC DISORDERS Anxiety Depression    negative neurological ROS     GI/Hepatic ,GERD (Barrett esophagus)  ,,  Endo/Other  diabetes, Type 2, Insulin  Dependent    Renal/GU      Musculoskeletal  (+) Arthritis ,    Abdominal   Peds  Hematology negative hematology ROS (+)   Anesthesia Other Findings   Reproductive/Obstetrics                             Anesthesia Physical Anesthesia Plan  ASA: 3  Anesthesia Plan: General   Post-op Pain Management:    Induction: Intravenous  PONV Risk Score and Plan: 2 and Ondansetron , Dexamethasone and Treatment may vary due to age or medical condition  Airway Management Planned: LMA  Additional Equipment:   Intra-op Plan:   Post-operative Plan: Extubation in OR  Informed Consent: I have reviewed the patients History and Physical, chart, labs and discussed the procedure including the risks, benefits and alternatives for the proposed anesthesia with the patient or authorized representative who has indicated his/her understanding and acceptance.     Dental advisory given  Plan Discussed with: CRNA  Anesthesia Plan Comments: (Discussed  anesthetic plan with son Larinda Plover via phone and answered questions. Patient consented for risks of anesthesia including but not limited to:  - adverse reactions to medications - damage to eyes, teeth, lips or other oral mucosa - nerve damage due to positioning  - sore throat or hoarseness - damage to heart, brain, nerves, lungs, other parts of body or loss of life  Informed patient about role of CRNA in peri- and intra-operative care.  Patient voiced understanding.)        Anesthesia Quick Evaluation

## 2024-02-05 NOTE — Anesthesia Procedure Notes (Signed)
 Procedure Name: LMA Insertion Date/Time: 02/05/2024 7:48 AM  Performed by: Ellayna Hilligoss R, CRNAPre-anesthesia Checklist: Patient identified, Emergency Drugs available, Suction available, Patient being monitored and Timeout performed Patient Re-evaluated:Patient Re-evaluated prior to induction Oxygen Delivery Method: Circle system utilized Preoxygenation: Pre-oxygenation with 100% oxygen Induction Type: IV induction LMA: LMA inserted LMA Size: 4.0 Number of attempts: 1 Placement Confirmation: positive ETCO2 and breath sounds checked- equal and bilateral Tube secured with: Tape Dental Injury: Teeth and Oropharynx as per pre-operative assessment

## 2024-02-06 ENCOUNTER — Encounter: Payer: Self-pay | Admitting: Urology

## 2024-02-06 ENCOUNTER — Ambulatory Visit (INDEPENDENT_AMBULATORY_CARE_PROVIDER_SITE_OTHER): Admitting: Urology

## 2024-02-06 DIAGNOSIS — Z9889 Other specified postprocedural states: Secondary | ICD-10-CM

## 2024-02-06 DIAGNOSIS — N400 Enlarged prostate without lower urinary tract symptoms: Secondary | ICD-10-CM

## 2024-02-06 LAB — SURGICAL PATHOLOGY

## 2024-02-06 NOTE — Progress Notes (Signed)
 Catheter Removal  Patient is present today for a catheter removal.  13 ml of water  was drained from the balloon. A 18 FR foley cath was removed from the bladder, no complications were noted. Patient tolerated well.  Performed by: Jola Nash CMA  Follow up/ Additional notes: call with results.

## 2024-02-07 ENCOUNTER — Ambulatory Visit: Payer: Self-pay | Admitting: Urology

## 2024-02-08 ENCOUNTER — Encounter: Payer: Self-pay | Admitting: Family Medicine

## 2024-02-08 ENCOUNTER — Ambulatory Visit (INDEPENDENT_AMBULATORY_CARE_PROVIDER_SITE_OTHER): Admitting: Family Medicine

## 2024-02-08 ENCOUNTER — Other Ambulatory Visit: Payer: Self-pay | Admitting: Family Medicine

## 2024-02-08 VITALS — BP 138/70 | HR 63 | Temp 97.8°F | Ht 72.0 in | Wt 208.2 lb

## 2024-02-08 DIAGNOSIS — M170 Bilateral primary osteoarthritis of knee: Secondary | ICD-10-CM

## 2024-02-08 DIAGNOSIS — Z794 Long term (current) use of insulin: Secondary | ICD-10-CM

## 2024-02-08 DIAGNOSIS — E11649 Type 2 diabetes mellitus with hypoglycemia without coma: Secondary | ICD-10-CM

## 2024-02-08 DIAGNOSIS — M5416 Radiculopathy, lumbar region: Secondary | ICD-10-CM

## 2024-02-08 DIAGNOSIS — G3184 Mild cognitive impairment, so stated: Secondary | ICD-10-CM | POA: Diagnosis not present

## 2024-02-08 DIAGNOSIS — M79604 Pain in right leg: Secondary | ICD-10-CM

## 2024-02-08 DIAGNOSIS — Z638 Other specified problems related to primary support group: Secondary | ICD-10-CM

## 2024-02-08 DIAGNOSIS — I10 Essential (primary) hypertension: Secondary | ICD-10-CM | POA: Diagnosis not present

## 2024-02-08 DIAGNOSIS — N3289 Other specified disorders of bladder: Secondary | ICD-10-CM | POA: Diagnosis not present

## 2024-02-08 LAB — POCT GLYCOSYLATED HEMOGLOBIN (HGB A1C): Hemoglobin A1C: 7.9 % — AB (ref 4.0–5.6)

## 2024-02-08 MED ORDER — TRESIBA FLEXTOUCH 200 UNIT/ML ~~LOC~~ SOPN: 32.0000 [IU] | PEN_INJECTOR | SUBCUTANEOUS | 1 refills | Status: DC

## 2024-02-08 MED ORDER — KETOROLAC TROMETHAMINE 30 MG/ML IJ SOLN
30.0000 mg | Freq: Once | INTRAMUSCULAR | Status: AC
  Administered 2024-02-08: 30 mg via INTRAMUSCULAR

## 2024-02-08 MED ORDER — TRAMADOL HCL 50 MG PO TABS: 50.0000 mg | ORAL_TABLET | Freq: Two times a day (BID) | ORAL | 0 refills | Status: AC | PRN

## 2024-02-08 MED ORDER — NAPROXEN 500 MG PO TABS
ORAL_TABLET | ORAL | 0 refills | Status: DC
Start: 2024-02-08 — End: 2024-03-19

## 2024-02-08 NOTE — Patient Instructions (Addendum)
 For right leg pain, toradol  shot today. Then tomorrow may start naprosyn 500mg  twice daily for 1 week  May use tramadol  50mg  as needed for breakthrough pain - caution with sedation, constipation, increased unsteadiness.  Let us  know if not improving with this.  Diabetes control is stable - increase tresiba  to 32 units daily.  Return in 6 weeks for follow up visit

## 2024-02-08 NOTE — Progress Notes (Unsigned)
 Ph: [phone] Fax: [phone]   Patient ID: Anthony [name], male    DOB: 1945-12-20, 78 y.o.   MRN: [id number]  This visit was conducted in person.  BP 138/70   Pulse 63   Temp 97.8 F (36.6 C) (Oral)   Ht 6' (1.829 m)   Wt 208 lb 4 oz (94.5 kg)   SpO2 98%   BMI 28.24 kg/m    CC: 6 wk diabetes f/u visit  Subjective:   HPI: Anthony [name] is a 78 y.o. male presenting on 02/08/2024 for Medical Management of Chronic Issues (Here for 6 wk DM f/u. Wants to make Dr Crissie Dome pt recently had penile surgery and is in pain. Pt accompanied by daughter, Idamae Maize, s--l, Jonell Neptune and wife, Abe Abed. )   They request handicap placard application.   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 He has memory issues himself.  MCI - on aricept  10mg  nightly - increased last visit.   Recently completed TURBT after episode of gross hematuria with abnormal cystoscopy - pathology didn't show cancer but rather benign urothelial tissue with reactive changes, and subjacent prostate tissue with fibromuscular hyperplasia.   R buttock pain with radiculopathy worse since TURBT. Now using cane to walk. Treating with excedrin without benefit.  Trouble bending knee.  No new bowel/bladder incontinence, saddle anesthesia,   HTN - BP better controlled on current regimen of amlodipine  5mg  daily, benicar  hct 40/25mg  daily.   DM - continues tresiba  30u daily, with novolog  flexpen 12-18u TID AC with SSI (10u 100-200, 15u 200-300, 18u >300) - pt unsure how he takes novolog  insulin . Family therefore helps manage insulin  injections.  CGM data: DexCom G7 30 day average sugar: 234, time in range 29%, hypoglycemia <1%, stage 1 hyperglycemia 28%, stage 2 hyperglycemia 42%.   Lab Results  Component Value Date   HGBA1C 7.9 (A) 02/08/2024        Relevant past medical, surgical, family and social history reviewed and updated as indicated. Interim medical history since our last visit reviewed. Allergies and medications reviewed and  updated. Outpatient Medications Prior to Visit  Medication Sig Dispense Refill   acetaminophen  (TYLENOL ) 500 MG tablet Take 500 mg by mouth 2 (two) times daily as needed for moderate pain (pain score 4-6).     amLODipine  (NORVASC ) 5 MG tablet Take 1 tablet (5 mg total) by mouth daily. For blood pressure 90 tablet 2   aspirin  EC 81 MG tablet Take 1 tablet (81 mg total) by mouth daily. Swallow whole. 30 tablet 12   Continuous Blood Gluc Sensor (DEXCOM G7 SENSOR) MISC by Does not apply route.     Dextrose , Diabetic Use, (GLUCOSE PO) Take 1-2 tablets by mouth as needed (hypoglycemia).     donepezil  (ARICEPT ) 10 MG tablet Take 1 tablet (10 mg total) by mouth at bedtime. 90 tablet 1   GVOKE HYPOPEN  1-PACK 1 MG/0.2ML SOAJ Inject under skin 0.2 mL as needed for hypoglycemia 0.2 mL 11   insulin  aspart (NOVOLOG  FLEXPEN) 100 UNIT/ML FlexPen INJECT 12-18 UNITS INTO THE SKIN 3 TIMES DAILY BEFORE MEALS PER SLIDING SCALE: 10 UNITS 100-200, 15 UNITS 200-300, 18 UNITS >300 CBG 15 mL 0   Insulin  Pen Needle (ADVOCATE INSULIN  PEN NEEDLES) 31G X 5 MM MISC Use 3x a day 300 each 3   metFORMIN  (GLUCOPHAGE -XR) 500 MG 24 hr tablet TAKE 1 TABLET BY MOUTH TWICE DAILY WITH A MEAL 180 tablet 3   olmesartan -hydrochlorothiazide  (BENICAR  HCT) 40-25 MG tablet Take 1 tablet by mouth daily.  90 tablet 2   ONETOUCH ULTRA test strip USE TO check blood glucose four times daily AS DIRECTED 150 strip 3   pantoprazole  (PROTONIX ) 40 MG tablet Take 1 tablet (40 mg total) by mouth daily. 90 tablet 2   simvastatin  (ZOCOR ) 20 MG tablet Take 1 tablet (20 mg total) by mouth at bedtime. 90 tablet 2   tadalafil  (CIALIS ) 20 MG tablet Take 0.5-1 tablets (10-20 mg total) by mouth every other day as needed. 5 tablet 3   insulin  degludec (TRESIBA  FLEXTOUCH) 200 UNIT/ML FlexTouch Pen INJECT 34 UNITS INTO THE SKIN DAILY 9 mL 3   No facility-administered medications prior to visit.     Per HPI unless specifically indicated in ROS section below Review  of Systems  Objective:  BP 138/70   Pulse 63   Temp 97.8 F (36.6 C) (Oral)   Ht 6' (1.829 m)   Wt 208 lb 4 oz (94.5 kg)   SpO2 98%   BMI 28.24 kg/m   Wt Readings from Last 3 Encounters:  02/08/24 208 lb 4 oz (94.5 kg)  02/05/24 205 lb (93 kg)  01/30/24 213 lb (96.6 kg)      Physical Exam Vitals and nursing note reviewed.  Constitutional:      Appearance: Normal appearance. He is not ill-appearing.  HENT:     Head: Normocephalic and atraumatic.   Eyes:     Extraocular Movements: Extraocular movements intact.     Pupils: Pupils are equal, round, and reactive to light.    Cardiovascular:     Rate and Rhythm: Normal rate and regular rhythm.     Pulses: Normal pulses.     Heart sounds: Normal heart sounds. No murmur heard. Pulmonary:     Effort: Pulmonary effort is normal. No respiratory distress.     Breath sounds: Normal breath sounds. No wheezing, rhonchi or rales.   Musculoskeletal:        General: Tenderness present.     Comments:  Evidence of severe knee osteoarthritis with marked crepitus with movement + seated SLR on right   Skin:    General: Skin is warm and dry.   Neurological:     Mental Status: He is alert.     Comments:  5/5 strength BLE  Psychiatric:        Mood and Affect: Mood normal.        Behavior: Behavior normal.       Results for orders placed or performed in visit on 02/08/24  POCT glycosylated hemoglobin (Hb A1C)   Collection Time: 02/08/24 12:22 PM  Result Value Ref Range   Hemoglobin A1C 7.9 (A) 4.0 - 5.6 %   HbA1c POC (<> result, manual entry)     HbA1c, POC (prediabetic range)     HbA1c, POC (controlled diabetic range)     Lab Results  Component Value Date   NA 138 01/31/2024   CL 103 01/31/2024   K 3.6 01/31/2024   CO2 26 01/31/2024   BUN 25 (H) 01/31/2024   CREATININE 1.20 01/31/2024   GFRNONAA >60 01/31/2024   CALCIUM 8.9 01/31/2024   PHOS 3.7 07/05/2011   ALBUMIN 3.8 05/07/2023   GLUCOSE 386 (H) 01/31/2024     Assessment & Plan:   Problem List Items Addressed This Visit     Type 2 diabetes mellitus with hypoglycemia ([name]) - Primary   Relevant Medications   insulin  degludec (TRESIBA  FLEXTOUCH) 200 UNIT/ML FlexTouch Pen   Other Relevant Orders   POCT glycosylated hemoglobin (  Hb A1C) (Completed)     Meds ordered this encounter  Medications   naproxen (NAPROSYN) 500 MG tablet    Sig: Take one po bid x 1 week then prn pain, take with food    Dispense:  20 tablet    Refill:  0   traMADol  (ULTRAM ) 50 MG tablet    Sig: Take 1 tablet (50 mg total) by mouth 2 (two) times daily as needed for up to 7 days for moderate pain (pain score 4-6).    Dispense:  14 tablet    Refill:  0   insulin  degludec (TRESIBA  FLEXTOUCH) 200 UNIT/ML FlexTouch Pen    Sig: Inject 32 Units into the skin daily.    Dispense:  15 mL    Refill:  1    Note new dose    Orders Placed This Encounter  Procedures   POCT glycosylated hemoglobin (Hb A1C)    Patient Instructions  For right leg pain, toradol  shot today. Then tomorrow may start naprosyn 500mg  twice daily for 1 week  May use tramadol  50mg  as needed for breakthrough pain - caution with sedation, constipation, increased unsteadiness.  Let us  know if not improving with this.  Diabetes control is stable - increase tresiba  to 32 units daily.  Return in 6 weeks for follow up visit   Follow up plan: Return in about 6 weeks (around 03/21/2024) for follow up visit.  Claire Crick, MD

## 2024-02-08 NOTE — Telephone Encounter (Signed)
 Plz submit PA for Tresiba  FlexTouch 200 U pen

## 2024-02-09 DIAGNOSIS — M5416 Radiculopathy, lumbar region: Secondary | ICD-10-CM | POA: Insufficient documentation

## 2024-02-09 NOTE — Assessment & Plan Note (Addendum)
 Continue aricept  10mg  nightly - seems to be tolerating well.  Rec limit driving Family more involved with insulin  management

## 2024-02-09 NOTE — Assessment & Plan Note (Signed)
 Noted during evaluation for gross hematuria. Recent TURBT reassuringly negative for cancer.

## 2024-02-09 NOTE — Assessment & Plan Note (Signed)
 Acute R buttock pain with radiculopathy down leg since TURBT.  Using cane to walk.  Excedrin wasn't helpful.  Exam today suspicious for R lumbar radiculopathy - without red flags. Rx toradol  30mg  IM today followed by naprosyn course x1 wk, tramadol  for breakthrough pain. Update with effect.

## 2024-02-09 NOTE — Assessment & Plan Note (Signed)
 Chronic insulin  dependent diabetes, MCI complicates care. Family becoming more involved with diabetes management (Son Larinda Plover, daugher Camden and SIL Jonell Neptune).  A1c stable at 7.9%.  Reviewing DexCom G7 - rare low sugars, but persistent hyperglycemia - for this reason did recommend titrating tresiba  to 32u daily. Recommend slow titration given h/o hypoglycemia.  Continue Novolog  TID AC with SSI (10u 100-200, 15u 200-300, 18u >300).  RTC 6 wks DM f/u visit Consider diabetes refresher course with a family member.

## 2024-02-09 NOTE — Assessment & Plan Note (Addendum)
 Chronic, severe knee osteoarthritis. Last saw [facility] Dr Christiane Cowing 2023.  Endo did not clear for knee replacement due to frequent hypoglycemia. Reviewing CGM readings, no significant recent hypoglycemia Ideal goal A1c <7.5%, unsure if will be attainable so would at least ensure <8%. Consider return to ortho.  Avoid systemic steroids in brittle diabetes.  Consider steroid injection vs visco-supplementation as temporizing measures - could see sports med for eval.

## 2024-02-09 NOTE — Assessment & Plan Note (Deleted)
 S/p TURP  Confirmed on recent TURBT.

## 2024-02-09 NOTE — Assessment & Plan Note (Signed)
 Chronic, stable on current regimen - continue. Seems to be doing better with adherence to meds with family involvement.

## 2024-02-16 ENCOUNTER — Other Ambulatory Visit: Payer: Self-pay | Admitting: Family Medicine

## 2024-02-16 DIAGNOSIS — Z794 Long term (current) use of insulin: Secondary | ICD-10-CM

## 2024-02-18 ENCOUNTER — Ambulatory Visit: Admitting: Podiatry

## 2024-02-18 ENCOUNTER — Encounter: Payer: Self-pay | Admitting: Podiatry

## 2024-02-18 DIAGNOSIS — E1142 Type 2 diabetes mellitus with diabetic polyneuropathy: Secondary | ICD-10-CM | POA: Diagnosis not present

## 2024-02-18 DIAGNOSIS — M79675 Pain in left toe(s): Secondary | ICD-10-CM

## 2024-02-18 DIAGNOSIS — B351 Tinea unguium: Secondary | ICD-10-CM | POA: Diagnosis not present

## 2024-02-18 DIAGNOSIS — M79674 Pain in right toe(s): Secondary | ICD-10-CM | POA: Diagnosis not present

## 2024-02-18 NOTE — Progress Notes (Signed)
  Subjective:  Patient ID: Anthony [name], male    DOB: 14-May-1946,   MRN: [id number]  Chief Complaint  Patient presents with   Debridement    Trim toenails/calluses - diabetic - 7.9    78 y.o. male presents for concern of thickened elongated and painful nails that are difficult to trim. Requesting to have them trimmed today. Relates burning and tingling in their feet. Patient is diabetic and last A1c was  Lab Results  Component Value Date   HGBA1C 7.9 (A) 02/08/2024   .   PCP:  [name], MD    . Denies any other pedal complaints. Denies n/v/f/c.   Past Medical History:  Diagnosis Date   Aortic atherosclerosis ([name]) 02/28/2020   By CT   Barrett's esophagus 12/08/2015   By EGD 2010 [place])    Bladder mass    BPH with obstruction/lower urinary tract symptoms 2014   s/p TURP   Cervical spondylosis    COPD (chronic obstructive pulmonary disease) ([name]) 02/28/2020   By CT: diffuse bronchial wall thickening with mild centrilobular and paraseptal emphysema (01/2020)   Coronary artery calcification seen on CAT scan 02/28/2020   3v CAD - calcified atherosclerotic plaque in the left main, left anterior descending and left circumflex coronary arteries (01/2020)   Diabetes mellitus type 2 with complications ([name])    completed DMSE   DKA (diabetic ketoacidoses) 01/2010   in coma   ED (erectile dysfunction)    Ex-smoker 01/21/2020   Family history of adverse reaction to anesthesia    son stopped breathing during colonoscopy   GERD (gastroesophageal reflux disease)    Gross hematuria    HTN (hypertension)    Hyperlipidemia    controlled with medicine   Laceration of right hand without foreign body 07/03/2023   MCI (mild cognitive impairment) with memory loss    Osteoarthritis    Rheumatic fever    maybe as child    Objective:  Physical Exam: Vascular: DP/PT pulses 2/4 bilateral. CFT <3 seconds. Normal hair growth on digits. No edema.  Skin. No lacerations or  abrasions bilateral feet. Hyperkeratotic lesion noted sub first metatarsal head on right. Nails 1-5 bilateral are thickened dystrophic and with subungudal debris.  Musculoskeletal: MMT 5/5 bilateral lower extremities in DF, PF, Inversion and Eversion. Deceased ROM in DF of ankle joint.  Neurological: Sensation intact to light touch.   Assessment:   1. Pain due to onychomycosis of toenails of both feet   2. Callus   3. Diabetic peripheral neuropathy associated with type 2 diabetes mellitus ([name])      Plan:  Patient was evaluated and treated and all questions answered. -Discussed and educated patient on diabetic foot care, especially with  regards to the vascular, neurological and musculoskeletal systems.  -Stressed the importance of good glycemic control and the detriment of not  controlling glucose levels in relation to the foot. -Discussed supportive shoes at all times and checking feet regularly.  -Mechanically debrided all nails 1-5 bilateral using sterile nail nipper and filed with dremel without incident  -Answered all patient questions -Patient to return  in 3 months for at risk foot care -Patient advised to call the office if any problems or questions arise in the meantime.   Asberry Failing, DPM

## 2024-02-20 ENCOUNTER — Telehealth: Payer: Self-pay

## 2024-02-20 ENCOUNTER — Other Ambulatory Visit (HOSPITAL_COMMUNITY): Payer: Self-pay

## 2024-02-20 NOTE — Telephone Encounter (Signed)
 Noted. See other messages by clicking blue 'View Conversation' link.  Denied request per message from pharmacist.

## 2024-02-20 NOTE — Telephone Encounter (Signed)
 Pharmacy Patient Advocate Encounter   Received notification from Pt Calls Messages that prior authorization for Tresiba  Flextouch is required/requested.   Insurance verification completed.   The patient is insured through [employer] .   Per test claim: Refill too soon. PA is not needed at this time. Medication was filled 02/08/24. Next eligible fill date is 03/21/24.

## 2024-02-20 NOTE — Telephone Encounter (Signed)
 Noted.   Spoke with pt/pt's daughter, Lorn, relaying following message from pharmacist: Refill too soon. PA is not needed at this time. Medication was filled 02/08/24. Next eligible fill date is 03/21/24.   Verbalizes understanding.

## 2024-02-25 DIAGNOSIS — Z794 Long term (current) use of insulin: Secondary | ICD-10-CM | POA: Diagnosis not present

## 2024-02-25 DIAGNOSIS — E113299 Type 2 diabetes mellitus with mild nonproliferative diabetic retinopathy without macular edema, unspecified eye: Secondary | ICD-10-CM | POA: Diagnosis not present

## 2024-02-26 ENCOUNTER — Encounter: Payer: Self-pay | Admitting: Family Medicine

## 2024-02-26 DIAGNOSIS — Z794 Long term (current) use of insulin: Secondary | ICD-10-CM

## 2024-02-26 MED ORDER — METFORMIN HCL ER 500 MG PO TB24: 500.0000 mg | ORAL_TABLET | Freq: Two times a day (BID) | ORAL | 0 refills | Status: DC

## 2024-02-26 NOTE — Telephone Encounter (Signed)
E-scribed refill to [facility]. 

## 2024-03-07 ENCOUNTER — Ambulatory Visit (INDEPENDENT_AMBULATORY_CARE_PROVIDER_SITE_OTHER): Admitting: Urology

## 2024-03-07 ENCOUNTER — Encounter: Payer: Self-pay | Admitting: Urology

## 2024-03-07 VITALS — BP 110/66 | HR 61 | Ht 72.0 in | Wt 160.0 lb

## 2024-03-07 DIAGNOSIS — N329 Bladder disorder, unspecified: Secondary | ICD-10-CM

## 2024-03-07 DIAGNOSIS — R310 Gross hematuria: Secondary | ICD-10-CM

## 2024-03-07 DIAGNOSIS — N401 Enlarged prostate with lower urinary tract symptoms: Secondary | ICD-10-CM

## 2024-03-09 ENCOUNTER — Other Ambulatory Visit: Payer: Self-pay | Admitting: Family Medicine

## 2024-03-09 ENCOUNTER — Encounter: Payer: Self-pay | Admitting: Urology

## 2024-03-09 DIAGNOSIS — E1169 Type 2 diabetes mellitus with other specified complication: Secondary | ICD-10-CM

## 2024-03-09 DIAGNOSIS — I10 Essential (primary) hypertension: Secondary | ICD-10-CM

## 2024-03-09 NOTE — Progress Notes (Signed)
 03/07/2024 11:54 AM   Anthony [name] 05/05/1946 [id number]  Referring provider: [name], MD [address] [place],  [place] [address]  Chief Complaint  Patient presents with   Other    Post-op    Urologic history: 1.  Gross hematuria Initially seen 11/2023 for recurrent gross hematuria CTU 12/10/23 with ~1 cm soft tissue filling defect anterior urinary bladder Cystoscopy with lateral lobe enlargement and hypervascularity/friability; abnormal appearing tissue anterior bladder neck region was noted no visualization suboptimal secondary to prostatic urethral bleeding Cystoscopy under anesthesia 02/05/2024 with prominent BPH/hypervascularity.  Inflammatory tissue right bladder neck resected.  Prominent superficial vessels bladder neck were cauterized Pathology: Benign urothelial tissue with reactive urothelial changes   HPI: Anthony [name] is a 78 y.o. male presents for postop follow-up  No complaints No bothersome LUTS or gross hematuria He had been notified of his path results and had no further questions   PMH: Past Medical History:  Diagnosis Date   Aortic atherosclerosis ([name]) 02/28/2020   By CT   Barrett's esophagus 12/08/2015   By EGD 2010 [place])    Bladder mass    BPH with obstruction/lower urinary tract symptoms 2014   s/p TURP   Cervical spondylosis    COPD (chronic obstructive pulmonary disease) ([name]) 02/28/2020   By CT: diffuse bronchial wall thickening with mild centrilobular and paraseptal emphysema (01/2020)   Coronary artery calcification seen on CAT scan 02/28/2020   3v CAD - calcified atherosclerotic plaque in the left main, left anterior descending and left circumflex coronary arteries (01/2020)   Diabetes mellitus type 2 with complications ([name])    completed DMSE   DKA (diabetic ketoacidoses) 01/2010   in coma   ED (erectile dysfunction)    Ex-smoker 01/21/2020   Family history of adverse reaction to anesthesia    son  stopped breathing during colonoscopy   GERD (gastroesophageal reflux disease)    Gross hematuria    HTN (hypertension)    Hyperlipidemia    controlled with medicine   Laceration of right hand without foreign body 07/03/2023   MCI (mild cognitive impairment) with memory loss    Osteoarthritis    Rheumatic fever    maybe as child    Surgical History: Past Surgical History:  Procedure Laterality Date   ABI  12/2013   WNL   APPENDECTOMY     CATARACT EXTRACTION W/PHACO Right 12/01/2020   Procedure: CATARACT EXTRACTION PHACO AND INTRAOCULAR LENS PLACEMENT (IOC)  RIGHT VIVITY TORIC LENS DIABETIC;  Surgeon: Mittie Gaskin, MD;  Location: [facility];  Service: Ophthalmology;  Laterality: Right;  6.93 1:12.8 9.5%   CATARACT EXTRACTION W/PHACO Left 12/15/2020   Procedure: CATARACT EXTRACTION PHACO AND INTRAOCULAR LENS PLACEMENT (IOC)  VIVITY TORIC LENS DIABETIC LEFT EYE;  Surgeon: Mittie Gaskin, MD;  Location: [facility];  Service: Ophthalmology;  Laterality: Left;  3.47 1:03.8 5.5%   CHOLECYSTECTOMY     years ago   COLONOSCOPY  01/2009   TA, rec rpt 5 yrs [facility])   COLONOSCOPY  01/2018   TAx2, diverticulosis, int hemorrhoids, rpt 5 yrs ([name])   CYSTOSCOPY WITH BIOPSY N/A 02/05/2024   Procedure: CYSTOSCOPY, WITH BIOPSY;  Surgeon: Twylla Glendia BROCKS, MD;  Location: [facility];  Service: Urology;  Laterality: N/A;   CYSTOSCOPY WITH FULGERATION N/A 02/05/2024   Procedure: CYSTOSCOPY, WITH BLADDER FULGURATION;  Surgeon: Twylla Glendia BROCKS, MD;  Location: [facility];  Service: Urology;  Laterality: N/A;   ESOPHAGOGASTRODUODENOSCOPY  01/2009   biopsy with Barrett's  ESOPHAGOGASTRODUODENOSCOPY  01/2018   WNL - no barrett's ([name])   INGUINAL HERNIA REPAIR Bilateral 03/04/2020   Procedure: LAPAROSCOPIC BILATERAL INGUINAL HERNIA REPAIR WITH MESH;  Surgeon: Vernetta Berg, MD;  Location: [facility];  Service: General;  Laterality: Bilateral;   LAPAROSCOPIC  APPENDECTOMY  07/05/2011   Procedure: APPENDECTOMY LAPAROSCOPIC;  Surgeon: Jina Nephew, MD;  Location: [facility];  Service: General;  Laterality: N/A;   MENISCUS REPAIR Right 2018   Norleen Gavel @ [facility]   TRANSURETHRAL RESECTION OF BLADDER TUMOR N/A 02/05/2024   Procedure: TURBT (TRANSURETHRAL RESECTION OF BLADDER TUMOR);  Surgeon: Twylla Glendia BROCKS, MD;  Location: [facility];  Service: Urology;  Laterality: N/A;   TRANSURETHRAL RESECTION OF PROSTATE N/A 07/28/2013   Procedure: TRANSURETHRAL RESECTION OF THE PROSTATE WITH GYRUS INSTRUMENTS;  Surgeon: Mark C Ottelin, MD   VASECTOMY     WISDOM TOOTH EXTRACTION      Home Medications:  Allergies as of 03/07/2024   No Known Allergies      Medication List        Accurate as of March 07, 2024 11:59 PM. If you have any questions, ask your nurse or doctor.          amLODipine  5 MG tablet Commonly known as: NORVASC  Take 1 tablet (5 mg total) by mouth daily. For blood pressure   aspirin  EC 81 MG tablet Take 1 tablet (81 mg total) by mouth daily. Swallow whole.   Dexcom G7 Sensor Misc by Does not apply route.   donepezil  10 MG tablet Commonly known as: ARICEPT  Take 1 tablet (10 mg total) by mouth at bedtime.   GLUCOSE PO Take 1-2 tablets by mouth as needed (hypoglycemia).   Gvoke HypoPen  1-Pack 1 MG/0.2ML Soaj Generic drug: Glucagon  Inject under skin 0.2 mL as needed for hypoglycemia   Insulin  Pen Needle 31G X 5 MM Misc Commonly known as: Advocate Insulin  Pen Needles Use 3x a day   metFORMIN  500 MG 24 hr tablet Commonly known as: GLUCOPHAGE -XR Take 1 tablet (500 mg total) by mouth 2 (two) times daily with a meal.   naproxen  500 MG tablet Commonly known as: Naprosyn  Take one po bid x 1 week then prn pain, take with food   NovoLOG  FlexPen 100 UNIT/ML FlexPen Generic drug: insulin  aspart INJECT 12-18 UNITS INTO THE SKIN 3 TIMES DAILY BEFORE MEALS PER SLIDING SCALE: 10 UNITS 100-200, 15 UNITS 200-300, 18 UNITS >300  CBG   olmesartan -hydrochlorothiazide  40-25 MG tablet Commonly known as: BENICAR  HCT Take 1 tablet by mouth daily.   OneTouch Ultra test strip Generic drug: glucose blood USE TO check blood glucose four times daily AS DIRECTED   pantoprazole  40 MG tablet Commonly known as: PROTONIX  Take 1 tablet (40 mg total) by mouth daily.   simvastatin  20 MG tablet Commonly known as: ZOCOR  Take 1 tablet (20 mg total) by mouth at bedtime.   tadalafil  20 MG tablet Commonly known as: CIALIS  Take 0.5-1 tablets (10-20 mg total) by mouth every other day as needed.   Tresiba  FlexTouch 200 UNIT/ML FlexTouch Pen Generic drug: insulin  degludec Inject 32 Units into the skin daily.   TYLENOL  500 MG tablet Generic drug: acetaminophen  Take 500 mg by mouth 2 (two) times daily as needed for moderate pain (pain score 4-6).        Allergies: No Known Allergies  Family History: Family History  Problem Relation Age of Onset   Lymphoma Mother        Non-hodgkins   Coronary artery disease  Mother    Heart disease Mother    Cancer Father    Alcohol abuse Father    Diabetes Brother    Pancreatic cancer Maternal Uncle    Diabetes Brother    Diabetes Brother    Diabetes Other    Colon cancer Neg Hx    Lung cancer Neg Hx    Esophageal cancer Neg Hx    Rectal cancer Neg Hx    Stomach cancer Neg Hx     Social History:  reports that he quit smoking about 12 years ago. His smoking use included cigarettes. He started smoking about 62 years ago. He has a 15 pack-year smoking history. He has never used smokeless tobacco. He reports current alcohol use of about 7.0 standard drinks of alcohol per week. He reports that he does not use drugs.   Physical Exam: BP 110/66   Pulse 61   Ht 6' (1.829 m)   Wt 160 lb (72.6 kg)   BMI 21.70 kg/m   Constitutional:  Alert and oriented, No acute distress. HEENT: [place] AT Respiratory: Normal respiratory effort, no increased work of breathing. Psychiatric: Normal mood  and affect.   Assessment & Plan:    1.  Gross hematuria Secondary to BPH Benign urothelial tissue on resection 1 year follow-up with urinalysis Call earlier for recurrent gross hematuria   Glendia JAYSON Barba, MD  [facility] [address], [address] [place], [place] [address] [phone]

## 2024-03-12 ENCOUNTER — Encounter: Payer: Self-pay | Admitting: Urology

## 2024-03-13 ENCOUNTER — Other Ambulatory Visit: Payer: Self-pay | Admitting: Family Medicine

## 2024-03-13 DIAGNOSIS — K219 Gastro-esophageal reflux disease without esophagitis: Secondary | ICD-10-CM

## 2024-03-14 ENCOUNTER — Ambulatory Visit: Payer: Self-pay

## 2024-03-14 DIAGNOSIS — N390 Urinary tract infection, site not specified: Secondary | ICD-10-CM | POA: Diagnosis not present

## 2024-03-14 NOTE — Telephone Encounter (Signed)
 FYI Only or Action Required?: Action required by provider: request for appointment.  Patient was last seen in primary care on 02/08/2024 by [name], MD.  Called Nurse Triage reporting Hematuria.  Symptoms began several days ago.  Interventions attempted: Nothing.  Symptoms are: gradually worsening. Saw blood in urine Wednesday then started having incontinence. Asking to be worked in today.Please advise daughter.  Triage Disposition: See Physician Within 24 Hours  Patient/caregiver understands and will follow disposition?: Yes    Copied from CRM [id number]. Topic: Clinical - Red Word Triage >> Mar 14, 2024 11:28 AM Jayma L wrote: Red Word that prompted transfer to Nurse Triage: dawn the daughter is calling, stated patient had prostate surgery and this was a month ago, said on Wednesday he was bleeding every time he urinated and was in pain . Now he is having incontinence. Answer Assessment - Initial Assessment Questions 1. COLOR of URINE: Describe the color of the urine.  (e.g., tea-colored, pink, red, bloody) Do you have blood clots in your urine? (e.g., none, pea, grape, small coin)     Red 2. ONSET: When did the bleeding start?      This week 3. EPISODES: How many times has there been blood in the urine? or How many times today?     X 1 day 4. PAIN with URINATION: Is there any pain with passing your urine? If Yes, ask: How bad is the pain?  (Scale 1-10; or mild, moderate, severe)     no 5. FEVER: Do you have a fever? If Yes, ask: What is your temperature, how was it measured, and when did it start?     no 6. ASSOCIATED SYMPTOMS: Are you passing urine more frequently than usual?     no 7. OTHER SYMPTOMS: Do you have any other symptoms? (e.g., back/flank pain, abdomen pain, vomiting)     no 8. PREGNANCY: Is there any chance you are pregnant? When was your last menstrual period?     N/a  Protocols used: Urine - Blood In-A-AH  Reason for  Disposition  Blood in urine  (Exception: Could be normal menstrual bleeding.)  Answer Assessment - Initial Assessment Questions 1. COLOR of URINE: Describe the color of the urine.  (e.g., tea-colored, pink, red, bloody) Do you have blood clots in your urine? (e.g., none, pea, grape, small coin)     Red 2. ONSET: When did the bleeding start?      This week 3. EPISODES: How many times has there been blood in the urine? or How many times today?     X 1 day 4. PAIN with URINATION: Is there any pain with passing your urine? If Yes, ask: How bad is the pain?  (Scale 1-10; or mild, moderate, severe)     no 5. FEVER: Do you have a fever? If Yes, ask: What is your temperature, how was it measured, and when did it start?     no 6. ASSOCIATED SYMPTOMS: Are you passing urine more frequently than usual?     no 7. OTHER SYMPTOMS: Do you have any other symptoms? (e.g., back/flank pain, abdomen pain, vomiting)     no 8. PREGNANCY: Is there any chance you are pregnant? When was your last menstrual period?     N/a  Protocols used: Urine - Blood In-A-AH

## 2024-03-14 NOTE — Telephone Encounter (Signed)
 Attempted to call number listed in triage note for Anthony [name], no answer.  Also called home number listed.  Left voicemails on both.  No appointments available in office today.  Need to encourage pt to go to urgent care due to concerns of blood in urine and incontinence.

## 2024-03-17 ENCOUNTER — Emergency Department (HOSPITAL_COMMUNITY)

## 2024-03-17 ENCOUNTER — Inpatient Hospital Stay (HOSPITAL_COMMUNITY)
Admission: EM | Admit: 2024-03-17 | Discharge: 2024-03-19 | DRG: 683 | Disposition: A | Discharge status: Home / Self Care | Source: Ambulatory Visit | Attending: Internal Medicine | Admitting: Internal Medicine

## 2024-03-17 DIAGNOSIS — Z8249 Family history of ischemic heart disease and other diseases of the circulatory system: Secondary | ICD-10-CM | POA: Diagnosis not present

## 2024-03-17 DIAGNOSIS — N179 Acute kidney failure, unspecified: Principal | ICD-10-CM | POA: Diagnosis present

## 2024-03-17 DIAGNOSIS — Z833 Family history of diabetes mellitus: Secondary | ICD-10-CM | POA: Diagnosis not present

## 2024-03-17 DIAGNOSIS — N401 Enlarged prostate with lower urinary tract symptoms: Secondary | ICD-10-CM | POA: Diagnosis present

## 2024-03-17 DIAGNOSIS — Z7982 Long term (current) use of aspirin: Secondary | ICD-10-CM

## 2024-03-17 DIAGNOSIS — I10 Essential (primary) hypertension: Secondary | ICD-10-CM | POA: Diagnosis present

## 2024-03-17 DIAGNOSIS — F0390 Unspecified dementia without behavioral disturbance: Secondary | ICD-10-CM | POA: Diagnosis present

## 2024-03-17 DIAGNOSIS — Z951 Presence of aortocoronary bypass graft: Secondary | ICD-10-CM | POA: Diagnosis not present

## 2024-03-17 DIAGNOSIS — Z811 Family history of alcohol abuse and dependence: Secondary | ICD-10-CM | POA: Diagnosis not present

## 2024-03-17 DIAGNOSIS — I2510 Atherosclerotic heart disease of native coronary artery without angina pectoris: Secondary | ICD-10-CM | POA: Diagnosis present

## 2024-03-17 DIAGNOSIS — J432 Centrilobular emphysema: Secondary | ICD-10-CM | POA: Diagnosis not present

## 2024-03-17 DIAGNOSIS — K219 Gastro-esophageal reflux disease without esophagitis: Secondary | ICD-10-CM | POA: Diagnosis present

## 2024-03-17 DIAGNOSIS — Z79899 Other long term (current) drug therapy: Secondary | ICD-10-CM

## 2024-03-17 DIAGNOSIS — Z8551 Personal history of malignant neoplasm of bladder: Secondary | ICD-10-CM

## 2024-03-17 DIAGNOSIS — K5732 Diverticulitis of large intestine without perforation or abscess without bleeding: Secondary | ICD-10-CM | POA: Diagnosis not present

## 2024-03-17 DIAGNOSIS — Z800 Family history of malignant neoplasm of digestive organs: Secondary | ICD-10-CM | POA: Diagnosis not present

## 2024-03-17 DIAGNOSIS — E1165 Type 2 diabetes mellitus with hyperglycemia: Secondary | ICD-10-CM | POA: Diagnosis not present

## 2024-03-17 DIAGNOSIS — Z794 Long term (current) use of insulin: Secondary | ICD-10-CM | POA: Diagnosis not present

## 2024-03-17 DIAGNOSIS — E785 Hyperlipidemia, unspecified: Secondary | ICD-10-CM | POA: Diagnosis not present

## 2024-03-17 DIAGNOSIS — Z7984 Long term (current) use of oral hypoglycemic drugs: Secondary | ICD-10-CM | POA: Diagnosis not present

## 2024-03-17 DIAGNOSIS — Z807 Family history of other malignant neoplasms of lymphoid, hematopoietic and related tissues: Secondary | ICD-10-CM

## 2024-03-17 DIAGNOSIS — E876 Hypokalemia: Secondary | ICD-10-CM | POA: Diagnosis present

## 2024-03-17 DIAGNOSIS — E870 Hyperosmolality and hypernatremia: Secondary | ICD-10-CM | POA: Diagnosis not present

## 2024-03-17 DIAGNOSIS — Z87891 Personal history of nicotine dependence: Secondary | ICD-10-CM

## 2024-03-17 DIAGNOSIS — E11649 Type 2 diabetes mellitus with hypoglycemia without coma: Secondary | ICD-10-CM | POA: Diagnosis not present

## 2024-03-17 DIAGNOSIS — Z9079 Acquired absence of other genital organ(s): Secondary | ICD-10-CM

## 2024-03-17 DIAGNOSIS — Z9049 Acquired absence of other specified parts of digestive tract: Secondary | ICD-10-CM | POA: Diagnosis not present

## 2024-03-17 DIAGNOSIS — R109 Unspecified abdominal pain: Secondary | ICD-10-CM | POA: Diagnosis not present

## 2024-03-17 DIAGNOSIS — R319 Hematuria, unspecified: Secondary | ICD-10-CM | POA: Diagnosis not present

## 2024-03-17 DIAGNOSIS — E1169 Type 2 diabetes mellitus with other specified complication: Secondary | ICD-10-CM | POA: Diagnosis not present

## 2024-03-17 LAB — CBC
HCT: 41.5 % (ref 39.0–52.0)
Hemoglobin: 13.5 g/dL (ref 13.0–17.0)
MCH: 28.6 pg (ref 26.0–34.0)
MCHC: 32.5 g/dL (ref 30.0–36.0)
MCV: 87.9 fL (ref 80.0–100.0)
Platelets: 232 K/uL (ref 150–400)
RBC: 4.72 MIL/uL (ref 4.22–5.81)
RDW: 12.4 % (ref 11.5–15.5)
WBC: 9 K/uL (ref 4.0–10.5)
nRBC: 0 % (ref 0.0–0.2)

## 2024-03-17 LAB — URINALYSIS, ROUTINE W REFLEX MICROSCOPIC
Bacteria, UA: NONE SEEN
Bilirubin Urine: NEGATIVE
Glucose, UA: 150 mg/dL — AB
Ketones, ur: 5 mg/dL — AB
Leukocytes,Ua: NEGATIVE
Nitrite: NEGATIVE
Protein, ur: NEGATIVE mg/dL
Specific Gravity, Urine: 1.009 (ref 1.005–1.030)
pH: 5 (ref 5.0–8.0)

## 2024-03-17 LAB — BASIC METABOLIC PANEL WITH GFR
Anion gap: 13 (ref 5–15)
BUN: 104 mg/dL — ABNORMAL HIGH (ref 8–23)
CO2: 20 mmol/L — ABNORMAL LOW (ref 22–32)
Calcium: 9.1 mg/dL (ref 8.9–10.3)
Chloride: 103 mmol/L (ref 98–111)
Creatinine, Ser: 3.57 mg/dL — ABNORMAL HIGH (ref 0.61–1.24)
GFR, Estimated: 17 mL/min — ABNORMAL LOW (ref >60)
Glucose, Bld: 268 mg/dL — ABNORMAL HIGH (ref 70–99)
Potassium: 3.7 mmol/L (ref 3.5–5.1)
Sodium: 136 mmol/L (ref 135–145)

## 2024-03-17 MED ORDER — LACTATED RINGERS IV BOLUS
1000.0000 mL | Freq: Once | INTRAVENOUS | Status: AC
  Administered 2024-03-17: 1000 mL via INTRAVENOUS

## 2024-03-17 NOTE — ED Provider Notes (Signed)
 [facility]ERG[facility] DEPARTMENT AT [facility] Provider Note   CSN: [id number] Arrival date & time: 03/17/24  [id number]     Patient presents with: No chief complaint on file.   Anthony [name] is a 78 y.o. male.   77-year male with past medical history of hypertension and diabetes with recent cystoscopy for further evaluation of a bladder mass that was found to be noncancerous a few weeks ago in [place] presenting to the emergency department today with fatigue and some generalized weakness.  He was brought in today after the patient's family took him back to urgent care and his kidney function was abnormal.  The patient continues to have some intermittent hematuria and states that he has been having some        Prior to Admission medications   Medication Sig Start Date End Date Taking? Authorizing Provider  acetaminophen  (TYLENOL ) 500 MG tablet Take 500 mg by mouth 2 (two) times daily as needed for moderate pain (pain score 4-6). 08/06/23   [name], MD  amLODipine  (NORVASC ) 5 MG tablet Take 1 tablet (5 mg total) by mouth daily. For blood pressure 09/04/23   [name], MD  aspirin  EC 81 MG tablet Take 1 tablet (81 mg total) by mouth daily. Swallow whole. 10/05/22   Santo Stanly LABOR, MD  Continuous Blood Gluc Sensor (DEXCOM G7 SENSOR) MISC by Does not apply route.    [provider]  Dextrose , Diabetic Use, (GLUCOSE PO) Take 1-2 tablets by mouth as needed (hypoglycemia).    [provider]  donepezil  (ARICEPT ) 10 MG tablet Take 1 tablet (10 mg total) by mouth at bedtime. 12/19/23   [name], MD  GVOKE HYPOPEN  1-PACK 1 MG/0.2ML SOAJ Inject under skin 0.2 mL as needed for hypoglycemia 10/24/21   Trixie File, MD  insulin  aspart (NOVOLOG  FLEXPEN) 100 UNIT/ML FlexPen INJECT 12-18 UNITS INTO THE SKIN 3 TIMES DAILY BEFORE MEALS PER SLIDING SCALE: 10 UNITS 100-200, 15 UNITS 200-300, 18 UNITS >300 CBG 02/18/24   [name], MD   insulin  degludec (TRESIBA  FLEXTOUCH) 200 UNIT/ML FlexTouch Pen Inject 32 Units into the skin daily. 02/08/24   [name], MD  Insulin  Pen Needle (ADVOCATE INSULIN  PEN NEEDLES) 31G X 5 MM MISC Use 3x a day 03/19/17   Trixie File, MD  metFORMIN  (GLUCOPHAGE -XR) 500 MG 24 hr tablet Take 1 tablet (500 mg total) by mouth 2 (two) times daily with a meal. 02/26/24   [name], MD  naproxen  (NAPROSYN ) 500 MG tablet Take one po bid x 1 week then prn pain, take with food 02/08/24   [name], MD  olmesartan -hydrochlorothiazide  (BENICAR  HCT) 40-25 MG tablet TAKE 1 TABLET BY MOUTH EVERY DAY 03/10/24   [name], MD  [facility] ULTRA test strip USE TO check blood glucose four times daily AS DIRECTED 12/20/22   Trixie File, MD  pantoprazole  (PROTONIX ) 40 MG tablet TAKE 1 TABLET BY MOUTH EVERY DAY 03/13/24   [name], MD  simvastatin  (ZOCOR ) 20 MG tablet TAKE 1 TABLET AT BEDTIME 03/10/24   [name], MD  tadalafil  (CIALIS ) 20 MG tablet Take 0.5-1 tablets (10-20 mg total) by mouth every other day as needed. 04/20/23   [name], MD    Allergies: Patient has no known allergies.    Review of Systems  Genitourinary:  Positive for difficulty urinating, flank pain and hematuria.  All other systems reviewed and are negative.   Updated Vital Signs BP (!) 151/72   Pulse 71   Temp  98 F (36.7 C)   Resp 18   SpO2 99%   Physical Exam Vitals and nursing note reviewed.   Gen: NAD Eyes: PERRL, EOMI HEENT: no oropharyngeal swelling Neck: trachea midline Resp: clear to auscultation bilaterally Card: RRR, no murmurs, rubs, or gallops Abd: nontender, nondistended Extremities: no calf tenderness, no edema Vascular: 2+ radial pulses bilaterally, 2+ DP pulses bilaterally Skin: no rashes Psyc: acting appropriately   (all labs ordered are listed, but only abnormal results are displayed) Labs Reviewed  URINALYSIS, ROUTINE W REFLEX MICROSCOPIC - Abnormal;  Notable for the following components:      Result Value   Color, Urine STRAW (*)    Glucose, UA 150 (*)    Hgb urine dipstick MODERATE (*)    Ketones, ur 5 (*)    All other components within normal limits  BASIC METABOLIC PANEL WITH GFR - Abnormal; Notable for the following components:   CO2 20 (*)    Glucose, Bld 268 (*)    BUN 104 (*)    Creatinine, Ser 3.57 (*)    GFR, Estimated 17 (*)    All other components within normal limits  CBC    EKG: None  Radiology: CT ABDOMEN PELVIS WO CONTRAST Result Date: 03/17/2024 EXAM: CT ABDOMEN AND PELVIS WITHOUT CONTRAST 03/17/2024 09:09:45 PM TECHNIQUE: CT of the abdomen and pelvis was performed without the administration of intravenous contrast. Multiplanar reformatted images are provided for review. Automated exposure control, iterative reconstruction, and/or weight based adjustment of the mA/kV was utilized to reduce the radiation dose to as low as reasonably achievable. COMPARISON: None available. CLINICAL HISTORY: Abdominal pain, acute, nonlocalized. Patient had bladder surgery about 6 weeks ago for hematuria. Surgery to remove mass in bladder. Has been having hematuria again last week, 7 days ago. Seen at UC last Friday, 3 days ago and had UA collected. Negative for UTI per family. Per family, patient is continuing to get weaker and having brain fog, and just not himself. Went back to UC again today and had bloodwork. UC informed patient to come to ED for abnormal labs. BUN 110 and Cre 3.7. Still having hematuria and bladder incontinence. Patient pain at this time. Does complain of painful urination, burning sensation. FINDINGS: LOWER CHEST: No acute abnormality. LIVER: The liver is unremarkable. GALLBLADDER AND BILE DUCTS: Status post cholecystectomy. SPLEEN: No acute abnormality. PANCREAS: No acute abnormality. ADRENAL GLANDS: No acute abnormality. KIDNEYS, URETERS AND BLADDER: Subcentimeter left lower pole renal cyst, benign (Bosniak 1). No  follow up is recommended. No stones in the kidneys or ureters. No hydronephrosis. No perinephric or periureteral stranding. Urinary bladder is unremarkable. GI AND BOWEL: Status post appendectomy. Mild left colonic diverticulosis, with evidence of diverticulitis. Stomach demonstrates no acute abnormality. There is no bowel obstruction. No bowel wall thickening. PERITONEUM AND RETROPERITONEUM: No ascites. No free air. VASCULATURE: Atherosclerotic calcifications of the abdominal aorta and branch vessels. LYMPH NODES: No lymphadenopathy. REPRODUCTIVE ORGANS: Prostatomegaly, with enlargement of the central gland indenting the base of the bladder, suggesting BPH. Suspected postsurgical changes related to prior TURP. BONES AND SOFT TISSUES: No acute osseous abnormality. Post surgical changes related to bilateral inguinal hernia repair. IMPRESSION: 1. No acute findings. 2. Prostatomegaly, suggesting BPH. Suspected postsurgical changes related to prior TURP. 3. Mild left colonic diverticulosis, with evidence of diverticulitis. Electronically signed by: Pinkie Pebbles MD 03/17/2024 09:15 PM EDT RP Workstation: [id number]     Procedures   Medications Ordered in the ED  lactated ringers  bolus 1,000 mL (1,000 mLs Intravenous  New Bag/Given 03/17/24 2015)                                    Medical Decision Making 78 year old male with past medical history of hypertension, diabetes, and bladder mass presents emergency department today with concern for some generalized weakness and AKI.  I will further evaluate the patient here with basic labs to evaluate for AKI.  I will further evaluate her with a CT scan of his abdomen without contrast to evaluate for clot retention or other postsurgical issues.  Will give patient IV fluids and reevaluate possible disposition.  The patient CT scan does not show any obvious large clot burden.  The patient is able to urinate here multiple times and was able to fill up a urinal  after CT suspicion for obstruction is low and there is no findings consistent with hydronephrosis.  Calls placed to hospitalist service for admission for the AKI.  Amount and/or Complexity of Data Reviewed Radiology: ordered.  Risk Decision regarding hospitalization.        Final diagnoses:  AKI (acute kidney injury) [facility])    ED Discharge Orders     None          Ula Prentice SAUNDERS, MD 03/17/24 2340

## 2024-03-17 NOTE — H&P (Incomplete)
 History and Physical    Anthony [name] FMW:[id number] DOB: Jan 22, 1946 DOA: 03/17/2024  PCP: [name], MD  Patient coming from: ***  I have personally briefly reviewed patient's old medical records in [facility]  Chief Complaint: ***  HPI: Anthony [name] is a 78 y.o. male with medical history significant of    ED Course: ***  Review of Systems: As per HPI otherwise 10 point review of systems negative.   Past Medical History:  Diagnosis Date   Aortic atherosclerosis ([name]) 02/28/2020   By CT   Barrett's esophagus 12/08/2015   By EGD 2010 [place])    Bladder mass    BPH with obstruction/lower urinary tract symptoms 2014   s/p TURP   Cervical spondylosis    COPD (chronic obstructive pulmonary disease) ([name]) 02/28/2020   By CT: diffuse bronchial wall thickening with mild centrilobular and paraseptal emphysema (01/2020)   Coronary artery calcification seen on CAT scan 02/28/2020   3v CAD - calcified atherosclerotic plaque in the left main, left anterior descending and left circumflex coronary arteries (01/2020)   Diabetes mellitus type 2 with complications ([name])    completed DMSE   DKA (diabetic ketoacidoses) 01/2010   in coma   ED (erectile dysfunction)    Ex-smoker 01/21/2020   Family history of adverse reaction to anesthesia    son stopped breathing during colonoscopy   GERD (gastroesophageal reflux disease)    Gross hematuria    HTN (hypertension)    Hyperlipidemia    controlled with medicine   Laceration of right hand without foreign body 07/03/2023   MCI (mild cognitive impairment) with memory loss    Osteoarthritis    Rheumatic fever    maybe as child    Past Surgical History:  Procedure Laterality Date   ABI  12/2013   WNL   APPENDECTOMY     CATARACT EXTRACTION W/PHACO Right 12/01/2020   Procedure: CATARACT EXTRACTION PHACO AND INTRAOCULAR LENS PLACEMENT (IOC)  RIGHT VIVITY TORIC LENS DIABETIC;  Surgeon: Mittie Gaskin, MD;   Location: [facility];  Service: Ophthalmology;  Laterality: Right;  6.93 1:12.8 9.5%   CATARACT EXTRACTION W/PHACO Left 12/15/2020   Procedure: CATARACT EXTRACTION PHACO AND INTRAOCULAR LENS PLACEMENT (IOC)  VIVITY TORIC LENS DIABETIC LEFT EYE;  Surgeon: Mittie Gaskin, MD;  Location: [facility];  Service: Ophthalmology;  Laterality: Left;  3.47 1:03.8 5.5%   CHOLECYSTECTOMY     years ago   COLONOSCOPY  01/2009   TA, rec rpt 5 yrs [place])   COLONOSCOPY  01/2018   TAx2, diverticulosis, int hemorrhoids, rpt 5 yrs ([name])   CYSTOSCOPY WITH BIOPSY N/A 02/05/2024   Procedure: CYSTOSCOPY, WITH BIOPSY;  Surgeon: Twylla Glendia BROCKS, MD;  Location: [facility];  Service: Urology;  Laterality: N/A;   CYSTOSCOPY WITH FULGERATION N/A 02/05/2024   Procedure: CYSTOSCOPY, WITH BLADDER FULGURATION;  Surgeon: Twylla Glendia BROCKS, MD;  Location: [facility];  Service: Urology;  Laterality: N/A;   ESOPHAGOGASTRODUODENOSCOPY  01/2009   biopsy with Barrett's   ESOPHAGOGASTRODUODENOSCOPY  01/2018   WNL - no barrett's ([name])   INGUINAL HERNIA REPAIR Bilateral 03/04/2020   Procedure: LAPAROSCOPIC BILATERAL INGUINAL HERNIA REPAIR WITH MESH;  Surgeon: Vernetta Berg, MD;  Location[facility];  Service: General;  Laterality: Bilateral;   LAPAROSCOPIC APPENDECTOMY  07/05/2011   Procedure: APPENDECTOMY LAPAROSCOPIC;  Surgeon: Jina Nephew, MD;  Location: [facility] OR;  Service: General;  Laterality: N/A;   MENISCUS REPAIR Right 2018   Norleen Gavel @ [facility]  TRANSURETHRAL RESECTION OF BLADDER TUMOR N/A 02/05/2024   Procedure: TURBT (TRANSURETHRAL RESECTION OF BLADDER TUMOR);  Surgeon: Twylla Glendia BROCKS, MD;  Location: [facility];  Service: Urology;  Laterality: N/A;   TRANSURETHRAL RESECTION OF PROSTATE N/A 07/28/2013   Procedure: TRANSURETHRAL RESECTION OF THE PROSTATE WITH GYRUS INSTRUMENTS;  Surgeon: Mark C Ottelin, MD   VASECTOMY     WISDOM TOOTH EXTRACTION       reports that he quit  smoking about 12 years ago. His smoking use included cigarettes. He started smoking about 62 years ago. He has a 15 pack-year smoking history. He has never used smokeless tobacco. He reports current alcohol use of about 7.0 standard drinks of alcohol per week. He reports that he does not use drugs.  No Known Allergies  Family History  Problem Relation Age of Onset   Lymphoma Mother        Non-hodgkins   Coronary artery disease Mother    Heart disease Mother    Cancer Father    Alcohol abuse Father    Diabetes Brother    Pancreatic cancer Maternal Uncle    Diabetes Brother    Diabetes Brother    Diabetes Other    Colon cancer Neg Hx    Lung cancer Neg Hx    Esophageal cancer Neg Hx    Rectal cancer Neg Hx    Stomach cancer Neg Hx    *** Prior to Admission medications   Medication Sig Start Date End Date Taking? Authorizing Provider  acetaminophen  (TYLENOL ) 500 MG tablet Take 500 mg by mouth 2 (two) times daily as needed for moderate pain (pain score 4-6). 08/06/23   [name], MD  amLODipine  (NORVASC ) 5 MG tablet Take 1 tablet (5 mg total) by mouth daily. For blood pressure 09/04/23   [name], MD  aspirin  EC 81 MG tablet Take 1 tablet (81 mg total) by mouth daily. Swallow whole. 10/05/22   Santo Stanly LABOR, MD  Continuous Blood Gluc Sensor (DEXCOM G7 SENSOR) MISC by Does not apply route.    [provider]  Dextrose , Diabetic Use, (GLUCOSE PO) Take 1-2 tablets by mouth as needed (hypoglycemia).    [provider]  donepezil  (ARICEPT ) 10 MG tablet Take 1 tablet (10 mg total) by mouth at bedtime. 12/19/23   [name], MD  GVOKE HYPOPEN  1-PACK 1 MG/0.2ML SOAJ Inject under skin 0.2 mL as needed for hypoglycemia 10/24/21   Trixie File, MD  insulin  aspart (NOVOLOG  FLEXPEN) 100 UNIT/ML FlexPen INJECT 12-18 UNITS INTO THE SKIN 3 TIMES DAILY BEFORE MEALS PER SLIDING SCALE: 10 UNITS 100-200, 15 UNITS 200-300, 18 UNITS >300 CBG 02/18/24    [name], MD  insulin  degludec (TRESIBA  FLEXTOUCH) 200 UNIT/ML FlexTouch Pen Inject 32 Units into the skin daily. 02/08/24   [name], MD  Insulin  Pen Needle (ADVOCATE INSULIN  PEN NEEDLES) 31G X 5 MM MISC Use 3x a day 03/19/17   Trixie File, MD  metFORMIN  (GLUCOPHAGE -XR) 500 MG 24 hr tablet Take 1 tablet (500 mg total) by mouth 2 (two) times daily with a meal. 02/26/24   [name], MD  naproxen  (NAPROSYN ) 500 MG tablet Take one po bid x 1 week then prn pain, take with food 02/08/24   [name], MD  olmesartan -hydrochlorothiazide  (BENICAR  HCT) 40-25 MG tablet TAKE 1 TABLET BY MOUTH EVERY DAY 03/10/24   [name], MD  [facility] ULTRA test strip USE TO check blood glucose four times daily AS DIRECTED 12/20/22   Trixie File, MD  pantoprazole  (PROTONIX ) 40 MG tablet TAKE 1 TABLET BY MOUTH EVERY DAY 03/13/24   [name], MD  simvastatin  (ZOCOR ) 20 MG tablet TAKE 1 TABLET AT BEDTIME 03/10/24   [name], MD  tadalafil  (CIALIS ) 20 MG tablet Take 0.5-1 tablets (10-20 mg total) by mouth every other day as needed. 04/20/23   [name], MD    Physical Exam: Vitals:   03/17/24 1723 03/17/24 2035  BP: 124/88 (!) 161/65  Pulse: 71 (!) 58  Resp: 18 18  Temp: 97.7 F (36.5 C) 98 F (36.7 C)  TempSrc: Oral   SpO2: 100% 94%    Constitutional: NAD, calm, comfortable Vitals:   03/17/24 1723 03/17/24 2035  BP: 124/88 (!) 161/65  Pulse: 71 (!) 58  Resp: 18 18  Temp: 97.7 F (36.5 C) 98 F (36.7 C)  TempSrc: Oral   SpO2: 100% 94%   Eyes: PERRL, lids and conjunctivae normal ENMT: Mucous membranes are moist. Posterior pharynx clear of any exudate or lesions.Normal dentition.  Neck: normal, supple, no masses, no thyromegaly Respiratory: clear to auscultation bilaterally, no wheezing, no crackles. Normal respiratory effort. No accessory muscle use.  Cardiovascular: Regular rate and rhythm, no murmurs / rubs / gallops. No extremity edema.  2+ pedal pulses. No carotid bruits.  Abdomen: no tenderness, no masses palpated. No hepatosplenomegaly. Bowel sounds positive.  Musculoskeletal: no clubbing / cyanosis. No joint deformity upper and lower extremities. Good ROM, no contractures. Normal muscle tone.  Skin: no rashes, lesions, ulcers. No induration Neurologic: CN 2-12 grossly intact. Sensation intact, DTR normal. Strength 5/5 in all 4.  Psychiatric: Normal judgment and insight. Alert and oriented x 3. Normal mood.    Labs on Admission: I have personally reviewed following labs and imaging studies  CBC: Recent Labs  Lab 03/17/24 1748  WBC 9.0  HGB 13.5  HCT 41.5  MCV 87.9  PLT 232   Basic Metabolic Panel: Recent Labs  Lab 03/17/24 1748  NA 136  K 3.7  CL 103  CO2 20*  GLUCOSE 268*  BUN 104*  CREATININE 3.57*  CALCIUM 9.1   GFR: Estimated Creatinine Clearance: 18.4 mL/min (A) (by C-G formula based on SCr of 3.57 mg/dL (H)). Liver Function Tests: No results for input(s): AST, ALT, ALKPHOS, BILITOT, PROT, ALBUMIN in the last 168 hours. No results for input(s): LIPASE, AMYLASE in the last 168 hours. No results for input(s): AMMONIA in the last 168 hours. Coagulation Profile: No results for input(s): INR, PROTIME in the last 168 hours. Cardiac Enzymes: No results for input(s): CKTOTAL, CKMB, CKMBINDEX, TROPONINI in the last 168 hours. BNP (last 3 results) No results for input(s): PROBNP in the last 8760 hours. HbA1C: No results for input(s): HGBA1C in the last 72 hours. CBG: No results for input(s): GLUCAP in the last 168 hours. Lipid Profile: No results for input(s): CHOL, HDL, LDLCALC, TRIG, CHOLHDL, LDLDIRECT in the last 72 hours. Thyroid  Function Tests: No results for input(s): TSH, T4TOTAL, FREET4, T3FREE, THYROIDAB in the last 72 hours. Anemia Panel: No results for input(s): VITAMINB12, FOLATE, FERRITIN, TIBC, IRON, RETICCTPCT in  the last 72 hours. Urine analysis:    Component Value Date/Time   COLORURINE STRAW (A) 03/17/2024 2138   APPEARANCEUR CLEAR 03/17/2024 2138   APPEARANCEUR Clear 01/25/2024 1323   LABSPEC 1.009 03/17/2024 2138   PHURINE 5.0 03/17/2024 2138   GLUCOSEU 150 (A) 03/17/2024 2138   GLUCOSEU NEGATIVE 02/04/2010 0921   HGBUR MODERATE (A) 03/17/2024 2138   HGBUR large 02/03/2010 1640   BILIRUBINUR  NEGATIVE 03/17/2024 2138   BILIRUBINUR Negative 01/25/2024 1323   KETONESUR 5 (A) 03/17/2024 2138   PROTEINUR NEGATIVE 03/17/2024 2138   UROBILINOGEN 0.2 11/19/2023 1706   UROBILINOGEN 0.2 07/04/2011 2303   NITRITE NEGATIVE 03/17/2024 2138   LEUKOCYTESUR NEGATIVE 03/17/2024 2138    Radiological Exams on Admission: CT ABDOMEN PELVIS WO CONTRAST Result Date: 03/17/2024 EXAM: CT ABDOMEN AND PELVIS WITHOUT CONTRAST 03/17/2024 09:09:45 PM TECHNIQUE: CT of the abdomen and pelvis was performed without the administration of intravenous contrast. Multiplanar reformatted images are provided for review. Automated exposure control, iterative reconstruction, and/or weight based adjustment of the mA/kV was utilized to reduce the radiation dose to as low as reasonably achievable. COMPARISON: None available. CLINICAL HISTORY: Abdominal pain, acute, nonlocalized. Patient had bladder surgery about 6 weeks ago for hematuria. Surgery to remove mass in bladder. Has been having hematuria again last week, 7 days ago. Seen at UC last Friday, 3 days ago and had UA collected. Negative for UTI per family. Per family, patient is continuing to get weaker and having brain fog, and just not himself. Went back to UC again today and had bloodwork. UC informed patient to come to ED for abnormal labs. BUN 110 and Cre 3.7. Still having hematuria and bladder incontinence. Patient pain at this time. Does complain of painful urination, burning sensation. FINDINGS: LOWER CHEST: No acute abnormality. LIVER: The liver is unremarkable. GALLBLADDER  AND BILE DUCTS: Status post cholecystectomy. SPLEEN: No acute abnormality. PANCREAS: No acute abnormality. ADRENAL GLANDS: No acute abnormality. KIDNEYS, URETERS AND BLADDER: Subcentimeter left lower pole renal cyst, benign (Bosniak 1). No follow up is recommended. No stones in the kidneys or ureters. No hydronephrosis. No perinephric or periureteral stranding. Urinary bladder is unremarkable. GI AND BOWEL: Status post appendectomy. Mild left colonic diverticulosis, with evidence of diverticulitis. Stomach demonstrates no acute abnormality. There is no bowel obstruction. No bowel wall thickening. PERITONEUM AND RETROPERITONEUM: No ascites. No free air. VASCULATURE: Atherosclerotic calcifications of the abdominal aorta and branch vessels. LYMPH NODES: No lymphadenopathy. REPRODUCTIVE ORGANS: Prostatomegaly, with enlargement of the central gland indenting the base of the bladder, suggesting BPH. Suspected postsurgical changes related to prior TURP. BONES AND SOFT TISSUES: No acute osseous abnormality. Post surgical changes related to bilateral inguinal hernia repair. IMPRESSION: 1. No acute findings. 2. Prostatomegaly, suggesting BPH. Suspected postsurgical changes related to prior TURP. 3. Mild left colonic diverticulosis, with evidence of diverticulitis. Electronically signed by: Pinkie Pebbles MD 03/17/2024 09:15 PM EDT RP Workstation: [id number]    EKG: Independently reviewed. ***  Assessment/Plan Principal Problem:   AKI (acute kidney injury) ([name])   ***  DVT prophylaxis: *** (Lovenox/Heparin/SCD's/anticoagulated/None (if comfort care) Code Status: *** (Full/Partial (specify details) Family Communication: *** (Specify name, relationship. Do not write discussed with patient. Specify tel # if discussed over the phone) Disposition Plan: *** (specify when and where you expect patient to be discharged) Consults called: *** (with names) Admission status: *** (inpatient / obs / tele / medical floor /  SDU)   Camila DELENA Ned MD [facility] Pager [phone]  If 7PM-7AM, please contact night-coverage [email or web address] Password [email or web address]  03/17/2024, 10:49 PM

## 2024-03-17 NOTE — Telephone Encounter (Signed)
 Lvm asking pt to call back. Needs OV if for urinary sxs, if pt wasn't seen over the weekend.

## 2024-03-17 NOTE — ED Triage Notes (Signed)
 Patient had bladder surgery about 6 weeks ago for hematuria. Surgery to remove mass in bladder. Has been having hematuria again last week, 7 days ago. Seen at UC last Friday, 3 days ago and had UA collected. Negative for UTI per family. Per family patient is continuing to get weaker and having brain fog, and just not himself. Went back to UC again today and had bloodwork. UC informed patient to come to ED for abnormal labs. BUN 110 and Cre 3.7. Still having hematuria and bladder incontinence. Patient pain at this time. Does complain of painful urination, burning sensation.

## 2024-03-18 ENCOUNTER — Other Ambulatory Visit: Payer: Self-pay

## 2024-03-18 ENCOUNTER — Encounter (HOSPITAL_COMMUNITY): Payer: Self-pay | Admitting: Internal Medicine

## 2024-03-18 DIAGNOSIS — E785 Hyperlipidemia, unspecified: Secondary | ICD-10-CM

## 2024-03-18 DIAGNOSIS — Z794 Long term (current) use of insulin: Secondary | ICD-10-CM

## 2024-03-18 DIAGNOSIS — I10 Essential (primary) hypertension: Secondary | ICD-10-CM | POA: Diagnosis not present

## 2024-03-18 DIAGNOSIS — K219 Gastro-esophageal reflux disease without esophagitis: Secondary | ICD-10-CM | POA: Diagnosis not present

## 2024-03-18 DIAGNOSIS — E876 Hypokalemia: Secondary | ICD-10-CM

## 2024-03-18 DIAGNOSIS — E1169 Type 2 diabetes mellitus with other specified complication: Secondary | ICD-10-CM | POA: Diagnosis not present

## 2024-03-18 DIAGNOSIS — N179 Acute kidney failure, unspecified: Secondary | ICD-10-CM | POA: Diagnosis not present

## 2024-03-18 LAB — COMPREHENSIVE METABOLIC PANEL WITH GFR
ALT: 13 U/L (ref 0–44)
AST: 11 U/L — ABNORMAL LOW (ref 15–41)
Albumin: 4.1 g/dL (ref 3.5–5.0)
Alkaline Phosphatase: 58 U/L (ref 38–126)
Anion gap: 10 (ref 5–15)
BUN: 81 mg/dL — ABNORMAL HIGH (ref 8–23)
CO2: 21 mmol/L — ABNORMAL LOW (ref 22–32)
Calcium: 9.6 mg/dL (ref 8.9–10.3)
Chloride: 110 mmol/L (ref 98–111)
Creatinine, Ser: 1.98 mg/dL — ABNORMAL HIGH (ref 0.61–1.24)
GFR, Estimated: 35 mL/min — ABNORMAL LOW (ref >60)
Glucose, Bld: 186 mg/dL — ABNORMAL HIGH (ref 70–99)
Potassium: 3.1 mmol/L — ABNORMAL LOW (ref 3.5–5.1)
Sodium: 141 mmol/L (ref 135–145)
Total Bilirubin: 0.7 mg/dL (ref 0.0–1.2)
Total Protein: 6.8 g/dL (ref 6.5–8.1)

## 2024-03-18 LAB — CBC WITH DIFFERENTIAL/PLATELET
Abs Immature Granulocytes: 0.02 K/uL (ref 0.00–0.07)
Basophils Absolute: 0 K/uL (ref 0.0–0.1)
Basophils Relative: 1 %
Eosinophils Absolute: 0.3 K/uL (ref 0.0–0.5)
Eosinophils Relative: 5 %
HCT: 42.6 % (ref 39.0–52.0)
Hemoglobin: 14.1 g/dL (ref 13.0–17.0)
Immature Granulocytes: 0 %
Lymphocytes Relative: 21 %
Lymphs Abs: 1.2 K/uL (ref 0.7–4.0)
MCH: 28.8 pg (ref 26.0–34.0)
MCHC: 33.1 g/dL (ref 30.0–36.0)
MCV: 87.1 fL (ref 80.0–100.0)
Monocytes Absolute: 0.4 K/uL (ref 0.1–1.0)
Monocytes Relative: 7 %
Neutro Abs: 4 K/uL (ref 1.7–7.7)
Neutrophils Relative %: 66 %
Platelets: 209 K/uL (ref 150–400)
RBC: 4.89 MIL/uL (ref 4.22–5.81)
RDW: 12.2 % (ref 11.5–15.5)
WBC: 6 K/uL (ref 4.0–10.5)
nRBC: 0 % (ref 0.0–0.2)

## 2024-03-18 LAB — HEMOGLOBIN A1C
Hgb A1c MFr Bld: 8.5 % — ABNORMAL HIGH (ref 4.8–5.6)
Mean Plasma Glucose: 197.25 mg/dL

## 2024-03-18 LAB — GLUCOSE, CAPILLARY
Glucose-Capillary: 157 mg/dL — ABNORMAL HIGH (ref 70–99)
Glucose-Capillary: 263 mg/dL — ABNORMAL HIGH (ref 70–99)
Glucose-Capillary: 369 mg/dL — ABNORMAL HIGH (ref 70–99)
Glucose-Capillary: 422 mg/dL — ABNORMAL HIGH (ref 70–99)

## 2024-03-18 LAB — MAGNESIUM: Magnesium: 2.3 mg/dL (ref 1.7–2.4)

## 2024-03-18 MED ORDER — AMLODIPINE BESYLATE 5 MG PO TABS
5.0000 mg | ORAL_TABLET | Freq: Every day | ORAL | Status: DC
  Administered 2024-03-18 – 2024-03-19 (×2): 5 mg via ORAL
  Filled 2024-03-18 (×2): qty 1

## 2024-03-18 MED ORDER — ALBUTEROL SULFATE (2.5 MG/3ML) 0.083% IN NEBU
2.5000 mg | INHALATION_SOLUTION | Freq: Four times a day (QID) | RESPIRATORY_TRACT | Status: DC
  Administered 2024-03-18: 2.5 mg via RESPIRATORY_TRACT
  Filled 2024-03-18: qty 3

## 2024-03-18 MED ORDER — ONDANSETRON HCL 4 MG/2ML IJ SOLN: 4.0000 mg | Freq: Four times a day (QID) | INTRAMUSCULAR | Status: DC | PRN

## 2024-03-18 MED ORDER — INSULIN ASPART 100 UNIT/ML IJ SOLN: 0.0000 [IU] | Freq: Every day | INTRAMUSCULAR | Status: DC

## 2024-03-18 MED ORDER — INSULIN ASPART 100 UNIT/ML IJ SOLN
0.0000 [IU] | Freq: Three times a day (TID) | INTRAMUSCULAR | Status: DC
  Administered 2024-03-18: 1 [IU] via SUBCUTANEOUS
  Administered 2024-03-18: 5 [IU] via SUBCUTANEOUS
  Administered 2024-03-18: 3 [IU] via SUBCUTANEOUS
  Administered 2024-03-19: 2 [IU] via SUBCUTANEOUS

## 2024-03-18 MED ORDER — INSULIN ASPART 100 UNIT/ML IJ SOLN
6.0000 [IU] | Freq: Once | INTRAMUSCULAR | Status: AC
  Administered 2024-03-18: 6 [IU] via SUBCUTANEOUS

## 2024-03-18 MED ORDER — ONDANSETRON HCL 4 MG PO TABS: 4.0000 mg | ORAL_TABLET | Freq: Four times a day (QID) | ORAL | Status: DC | PRN

## 2024-03-18 MED ORDER — ALBUTEROL SULFATE (2.5 MG/3ML) 0.083% IN NEBU: 2.5000 mg | INHALATION_SOLUTION | RESPIRATORY_TRACT | Status: DC | PRN

## 2024-03-18 MED ORDER — DONEPEZIL HCL 10 MG PO TABS
10.0000 mg | ORAL_TABLET | Freq: Every day | ORAL | Status: DC
  Administered 2024-03-18: 10 mg via ORAL
  Filled 2024-03-18: qty 1

## 2024-03-18 MED ORDER — SODIUM CHLORIDE 0.9 % IV SOLN: INTRAVENOUS | Status: AC

## 2024-03-18 MED ORDER — PANTOPRAZOLE SODIUM 40 MG PO TBEC
40.0000 mg | DELAYED_RELEASE_TABLET | Freq: Every day | ORAL | Status: DC
  Administered 2024-03-18 – 2024-03-19 (×2): 40 mg via ORAL
  Filled 2024-03-18 (×2): qty 1

## 2024-03-18 MED ORDER — INSULIN DEGLUDEC 200 UNIT/ML ~~LOC~~ SOPN: 20.0000 [IU] | PEN_INJECTOR | SUBCUTANEOUS | Status: DC

## 2024-03-18 MED ORDER — INSULIN ASPART 100 UNIT/ML IJ SOLN
3.0000 [IU] | Freq: Three times a day (TID) | INTRAMUSCULAR | Status: DC
  Administered 2024-03-19 (×2): 3 [IU] via SUBCUTANEOUS

## 2024-03-18 MED ORDER — POTASSIUM CHLORIDE CRYS ER 20 MEQ PO TBCR
40.0000 meq | EXTENDED_RELEASE_TABLET | Freq: Two times a day (BID) | ORAL | Status: AC
  Administered 2024-03-18 (×2): 40 meq via ORAL
  Filled 2024-03-18 (×2): qty 2

## 2024-03-18 MED ORDER — ENSURE PLUS HIGH PROTEIN PO LIQD
237.0000 mL | Freq: Two times a day (BID) | ORAL | Status: DC
  Administered 2024-03-19 (×2): 237 mL via ORAL

## 2024-03-18 MED ORDER — ACETAMINOPHEN 500 MG PO TABS
500.0000 mg | ORAL_TABLET | Freq: Two times a day (BID) | ORAL | Status: DC | PRN
  Administered 2024-03-18: 500 mg via ORAL
  Filled 2024-03-18: qty 1

## 2024-03-18 MED ORDER — SIMVASTATIN 20 MG PO TABS
20.0000 mg | ORAL_TABLET | Freq: Every day | ORAL | Status: DC
  Administered 2024-03-18: 20 mg via ORAL
  Filled 2024-03-18: qty 1

## 2024-03-18 MED ORDER — INSULIN GLARGINE-YFGN 100 UNIT/ML ~~LOC~~ SOLN
20.0000 [IU] | Freq: Every day | SUBCUTANEOUS | Status: DC
  Administered 2024-03-18 – 2024-03-19 (×2): 20 [IU] via SUBCUTANEOUS
  Filled 2024-03-18 (×2): qty 0.2

## 2024-03-18 NOTE — Plan of Care (Signed)

## 2024-03-18 NOTE — Progress Notes (Signed)
   03/18/24 0854  TOC Brief Assessment  Insurance and Status Reviewed  Patient has primary care physician Yes Achilles, Anton, MD)  Home environment has been reviewed home  Prior level of function: Independent  Prior/Current Home Services No current home services  Social Drivers of Health Review SDOH reviewed no interventions necessary  Readmission risk has been reviewed Yes  Transition of care needs no transition of care needs at this time

## 2024-03-18 NOTE — Inpatient Diabetes Management (Signed)
 Inpatient Diabetes Program Recommendations  AACE/ADA: New Consensus Statement on Inpatient Glycemic Control (2015)  Target Ranges:  Prepandial:   less than 140 mg/dL      Peak postprandial:   less than 180 mg/dL (1-2 hours)      Critically ill patients:  140 - 180 mg/dL   Lab Results  Component Value Date   GLUCAP 263 (H) 03/18/2024   HGBA1C 7.9 (A) 02/08/2024    Review of Glycemic Control  Diabetes history: DM2 Outpatient Diabetes medications: Tresiba  32 units daily, Novolog  12-18 units TID, metformin  500 mg BID Current orders for Inpatient glycemic control: Semglee  20 units daily, Novolog  0-6 TID and 0-5 HS  Post-prandials elevated. May benefit from addition of meal coverage insulin .  Inpatient Diabetes Program Recommendations:    Consider adding Novolog  4 units TID with meals if eating > 50%.   Will follow glucose trends.  Thank you. Shona Brandy, RD, LDN, CDCES Inpatient Diabetes Coordinator [phone]

## 2024-03-18 NOTE — Progress Notes (Signed)
 [facility]                                                                               [name], is a 78 y.o. male, DOB - [birth year], FMW:[id number] Admit date - 03/17/2024    Outpatient Primary MD for the patient is Anthony Baller, MD  LOS - 1  days    Brief summary   Anthony [name] is a 78 y.o. male with medical history significant of  DMII, GERD, COPD,  MCI,CAD s/p CABG, HTN, HLD, interim history of TURBT on 02/05/24,who presents to ED in referral from due persistent weakness and fatigue with noted abnormal outpatient labs  noting AKI. Patient denies any fever / or chills N/V.   Assessment & Plan    Assessment and Plan:  Acute Kidney Injury:  Suspect from poor oral intake.  Admitted with a creatinine of 3.57, improved to 1.98.  CT abdomen and pelvis showed  No acute findings. Prostatomegaly, suggesting BPH. Suspected postsurgical changes related to prior TURP.   Hypokalemia Replaced.   Type 2 Diabetes mellitus with hyperglycemia.  CBG (last 3)  Recent Labs    03/18/24 0730 03/18/24 1141 03/18/24 1617  GLUCAP 157* 263* 369*   Resume SSI. Hemoglobin A1c is pending.  Semglee  20 units daily.  Add novolog  3 units tidac.    Hyperlipidemia Resume statin 20 mg daily.    Dementia On aricept .    Hypertension:  Well controlled.   GERD stable.    Copd; No wheezing heard.    Estimated body mass index is 26.18 kg/m as calculated from the following:   Height as of this encounter: 6' (1.829 m).   Weight as of this encounter: 87.5 kg.  Code Status: full code.  DVT Prophylaxis:  SCDs Start: 03/18/24 0608   Level of Care: Level of care: Telemetry Family Communication: none at bedside.   Disposition Plan:     Remains inpatient appropriate:  pending improvement.   Procedures:  None.   Consultants:   None.   Antimicrobials:   Anti-infectives (From admission, onward)    None        Medications  Scheduled Meds:   amLODipine   5 mg Oral Daily   donepezil   10 mg Oral QHS   insulin  aspart  0-5 Units Subcutaneous QHS   insulin  aspart  0-6 Units Subcutaneous TID WC   insulin  glargine-yfgn  20 Units Subcutaneous Daily   pantoprazole   40 mg Oral Daily   potassium chloride   40 mEq Oral BID   simvastatin   20 mg Oral QHS   Continuous Infusions:  sodium chloride  125 mL/hr at 03/18/24 0631   PRN Meds:.acetaminophen , albuterol , ondansetron  **OR** ondansetron  (ZOFRAN ) IV    Subjective:   [name] was seen and examined today.  He reports feeling better. No chest pain or sob.   Objective:   Vitals:   03/18/24 0052 03/18/24 0457 03/18/24 0847 03/18/24 1207  BP: (!) 142/70 131/81  131/63  Pulse: (!) 53 68  (!) 59  Resp: 20 20  17   Temp: 98.3 F (36.8 C) 97.9 F (36.6 C)  (!) 97.3 F (36.3 C)  TempSrc: Oral   Oral  SpO2:  99% 98% 97%  Weight: 87.5 kg     Height:        Intake/Output Summary (Last 24 hours) at 03/18/2024 1319 Last data filed at 03/18/2024 1300 Gross per 24 hour  Intake 1480 ml  Output 400 ml  Net 1080 ml   Filed Weights   03/18/24 0052  Weight: 87.5 kg     Exam General exam: Appears calm and comfortable  Respiratory system: Clear to auscultation. Respiratory effort normal. Cardiovascular system: S1 & S2 heard, RRR. No JVD,  Gastrointestinal system: Abdomen is nondistended, soft and nontender.  Central nervous system: Alert and oriented.  Extremities: no edema.  Skin: No rashes,  Psychiatry: Mood & affect appropriate.     Data Reviewed:  I have personally reviewed following labs and imaging studies   CBC Lab Results  Component Value Date   WBC 6.0 03/18/2024   RBC 4.89 03/18/2024   HGB 14.1 03/18/2024   HCT 42.6 03/18/2024   MCV 87.1 03/18/2024   MCH 28.8 03/18/2024   PLT 209 03/18/2024   MCHC 33.1 03/18/2024   RDW 12.2 03/18/2024   LYMPHSABS 1.2 03/18/2024   MONOABS 0.4 03/18/2024   EOSABS 0.3 03/18/2024   BASOSABS 0.0 03/18/2024     Last  metabolic panel Lab Results  Component Value Date   NA 141 03/18/2024   K 3.1 (L) 03/18/2024   CL 110 03/18/2024   CO2 21 (L) 03/18/2024   BUN 81 (H) 03/18/2024   CREATININE 1.98 (H) 03/18/2024   GLUCOSE 186 (H) 03/18/2024   GFRNONAA 35 (L) 03/18/2024   GFRAA >60 03/02/2020   CALCIUM 9.6 03/18/2024   PHOS 3.7 07/05/2011   PROT 6.8 03/18/2024   ALBUMIN 4.1 03/18/2024   BILITOT 0.7 03/18/2024   ALKPHOS 58 03/18/2024   AST 11 (L) 03/18/2024   ALT 13 03/18/2024   ANIONGAP 10 03/18/2024    CBG (last 3)  Recent Labs    03/18/24 0730 03/18/24 1141  GLUCAP 157* 263*      Coagulation Profile: No results for input(s): INR, PROTIME in the last 168 hours.   Radiology Studies: CT ABDOMEN PELVIS WO CONTRAST Result Date: 03/17/2024 EXAM: CT ABDOMEN AND PELVIS WITHOUT CONTRAST 03/17/2024 09:09:45 PM TECHNIQUE: CT of the abdomen and pelvis was performed without the administration of intravenous contrast. Multiplanar reformatted images are provided for review. Automated exposure control, iterative reconstruction, and/or weight based adjustment of the mA/kV was utilized to reduce the radiation dose to as low as reasonably achievable. COMPARISON: None available. CLINICAL HISTORY: Abdominal pain, acute, nonlocalized. Patient had bladder surgery about 6 weeks ago for hematuria. Surgery to remove mass in bladder. Has been having hematuria again last week, 7 days ago. Seen at UC last Friday, 3 days ago and had UA collected. Negative for UTI per family. Per family, patient is continuing to get weaker and having brain fog, and just not himself. Went back to UC again today and had bloodwork. UC informed patient to come to ED for abnormal labs. BUN 110 and Cre 3.7. Still having hematuria and bladder incontinence. Patient pain at this time. Does complain of painful urination, burning sensation. FINDINGS: LOWER CHEST: No acute abnormality. LIVER: The liver is unremarkable. GALLBLADDER AND BILE DUCTS:  Status post cholecystectomy. SPLEEN: No acute abnormality. PANCREAS: No acute abnormality. ADRENAL GLANDS: No acute abnormality. KIDNEYS, URETERS AND BLADDER: Subcentimeter left lower pole renal cyst, benign (Bosniak 1). No follow up is recommended. No stones in the kidneys or ureters. No hydronephrosis. No  perinephric or periureteral stranding. Urinary bladder is unremarkable. GI AND BOWEL: Status post appendectomy. Mild left colonic diverticulosis, with evidence of diverticulitis. Stomach demonstrates no acute abnormality. There is no bowel obstruction. No bowel wall thickening. PERITONEUM AND RETROPERITONEUM: No ascites. No free air. VASCULATURE: Atherosclerotic calcifications of the abdominal aorta and branch vessels. LYMPH NODES: No lymphadenopathy. REPRODUCTIVE ORGANS: Prostatomegaly, with enlargement of the central gland indenting the base of the bladder, suggesting BPH. Suspected postsurgical changes related to prior TURP. BONES AND SOFT TISSUES: No acute osseous abnormality. Post surgical changes related to bilateral inguinal hernia repair. IMPRESSION: 1. No acute findings. 2. Prostatomegaly, suggesting BPH. Suspected postsurgical changes related to prior TURP. 3. Mild left colonic diverticulosis, with evidence of diverticulitis. Electronically signed by: Pinkie Pebbles MD 03/17/2024 09:15 PM EDT RP Workstation: [id number]       Elgie Butter M.D. [facility] 03/18/2024, 1:19 PM  Available via Epic secure chat 7am-7pm After 7 pm, please refer to night coverage provider listed on amion.

## 2024-03-18 NOTE — Telephone Encounter (Signed)
Patient at hospital

## 2024-03-19 DIAGNOSIS — E11649 Type 2 diabetes mellitus with hypoglycemia without coma: Secondary | ICD-10-CM | POA: Diagnosis not present

## 2024-03-19 DIAGNOSIS — N179 Acute kidney failure, unspecified: Secondary | ICD-10-CM | POA: Diagnosis not present

## 2024-03-19 DIAGNOSIS — I10 Essential (primary) hypertension: Secondary | ICD-10-CM | POA: Diagnosis not present

## 2024-03-19 DIAGNOSIS — E785 Hyperlipidemia, unspecified: Secondary | ICD-10-CM | POA: Diagnosis not present

## 2024-03-19 LAB — GLUCOSE, CAPILLARY
Glucose-Capillary: 94 mg/dL (ref 70–99)
Glucose-Capillary: 250 mg/dL — ABNORMAL HIGH (ref 70–99)

## 2024-03-19 LAB — COMPREHENSIVE METABOLIC PANEL WITH GFR
ALT: 13 U/L (ref 0–44)
AST: 12 U/L — ABNORMAL LOW (ref 15–41)
Albumin: 3.5 g/dL (ref 3.5–5.0)
Alkaline Phosphatase: 49 U/L (ref 38–126)
Anion gap: 10 (ref 5–15)
BUN: 45 mg/dL — ABNORMAL HIGH (ref 8–23)
CO2: 23 mmol/L (ref 22–32)
Calcium: 9.4 mg/dL (ref 8.9–10.3)
Chloride: 117 mmol/L — ABNORMAL HIGH (ref 98–111)
Creatinine, Ser: 0.97 mg/dL (ref 0.61–1.24)
GFR, Estimated: >60 mL/min (ref >60)
Glucose, Bld: 109 mg/dL — ABNORMAL HIGH (ref 70–99)
Potassium: 3.3 mmol/L — ABNORMAL LOW (ref 3.5–5.1)
Sodium: 150 mmol/L — ABNORMAL HIGH (ref 135–145)
Total Bilirubin: 0.8 mg/dL (ref 0.0–1.2)
Total Protein: 5.9 g/dL — ABNORMAL LOW (ref 6.5–8.1)

## 2024-03-19 LAB — URINE CULTURE: Culture: NO GROWTH

## 2024-03-19 LAB — CBC
HCT: 36.9 % — ABNORMAL LOW (ref 39.0–52.0)
Hemoglobin: 12.4 g/dL — ABNORMAL LOW (ref 13.0–17.0)
MCH: 29.4 pg (ref 26.0–34.0)
MCHC: 33.6 g/dL (ref 30.0–36.0)
MCV: 87.4 fL (ref 80.0–100.0)
Platelets: 195 K/uL (ref 150–400)
RBC: 4.22 MIL/uL (ref 4.22–5.81)
RDW: 12.4 % (ref 11.5–15.5)
WBC: 5.5 K/uL (ref 4.0–10.5)
nRBC: 0 % (ref 0.0–0.2)

## 2024-03-19 LAB — SODIUM: Sodium: 141 mmol/L (ref 135–145)

## 2024-03-19 MED ORDER — POTASSIUM CHLORIDE CRYS ER 20 MEQ PO TBCR
40.0000 meq | EXTENDED_RELEASE_TABLET | Freq: Two times a day (BID) | ORAL | Status: DC
  Administered 2024-03-19: 40 meq via ORAL
  Filled 2024-03-19: qty 2

## 2024-03-19 MED ORDER — METFORMIN HCL ER 500 MG PO TB24: 500.0000 mg | ORAL_TABLET | Freq: Two times a day (BID) | ORAL | Status: DC

## 2024-03-19 MED ORDER — ENSURE PLUS HIGH PROTEIN PO LIQD: 237.0000 mL | Freq: Two times a day (BID) | ORAL | 2 refills | Status: DC

## 2024-03-19 NOTE — Progress Notes (Signed)
 Awaiting arrival of patients son to proceed with discharge instructions.

## 2024-03-19 NOTE — TOC Progression Note (Signed)
 Transition of Care [facility]) - Progression Note    Patient Details  Name: Anthony [name] MRN: [id number] Date of Birth: March 16, 1946  Transition of Care [facility]) CM/SW Contact  Toy LITTIE Agar, RN Phone Number:[phone]  03/19/2024, 3:07 PM  Clinical Narrative:    CM at bedside to offer choice for patient with recommendations HH. Patient ambulating in hallways independently. Patient states that he does not want any home health PT. Patient states that he will continue to do his own exercises. No home health has been set up.                      Expected Discharge Plan and Services         Expected Discharge Date: 03/19/24                                     Social Drivers of Health (SDOH) Interventions SDOH Screenings   Food Insecurity: No Food Insecurity (03/18/2024)  Housing: Low Risk  (03/18/2024)  Transportation Needs: No Transportation Needs (03/18/2024)  Utilities: Not At Risk (03/18/2024)  Alcohol Screen: Low Risk  (01/12/2020)  Depression (PHQ2-9): Low Risk  (07/06/2023)  Financial Resource Strain: Low Risk  (09/07/2022)  Physical Activity: Inactive (04/13/2022)  Social Connections: Socially Integrated (03/18/2024)  Stress: No Stress Concern Present (04/13/2022)  Tobacco Use: Medium Risk (03/18/2024)    Readmission Risk Interventions     No data to display

## 2024-03-19 NOTE — Plan of Care (Signed)

## 2024-03-19 NOTE — Evaluation (Signed)
 Physical Therapy Evaluation Patient Details Name: Anthony [name] MRN: [id number] DOB: [birth year] Today's Date: 03/19/2024  History of Present Illness  78 yo male presents to therapy following hospital admission on 03/17/2024 due to weakness and fatigue. Pt was found to have abn labs and dx with AKI attributed to poor PO intake. Pt PMH includes but is not limited to: recent hx of TURBT 02/05/2024, DM II, GERD, dementia, COPD, MI, CAD s/p CABG, HTN and HLD.  Clinical Impression  Pt admitted with above diagnosis.  Pt currently with functional limitations due to the deficits listed below (see PT Problem List). Pt seated in recliner when PT arrived. Pt eager to get up and move and requested to use the bathroom prior to walking in the hallway, S for transfer tasks, gait assessment with RW, SPC and no AD-- please see below for gait description. PT recommends use of SPC in home setting for safety and stability pt verbalized understanding and indicated having SPC. Pt reported no pain, dizziness, or SOB with mobility tasks today. Pt is motivated to return to spouse whom he is a full time caregiver secondary to dementia.  PT encouraged pt to reach out to family for some assist when returning home. Pt in agreement. PT recommends continued therapy services in home setting to progress and obtain PLOF. Pt will benefit from acute skilled PT to increase their independence and safety with mobility to allow discharge.         If plan is discharge home, recommend the following: A little help with walking and/or transfers;A little help with bathing/dressing/bathroom;Assistance with cooking/housework;Assist for transportation;Help with stairs or ramp for entrance   Can travel by private vehicle        Equipment Recommendations None recommended by PT  Recommendations for Other Services       Functional Status Assessment Patient has had a recent decline in their functional status and demonstrates the ability to  make significant improvements in function in a reasonable and predictable amount of time.     Precautions / Restrictions Precautions Precautions: Fall Restrictions Weight Bearing Restrictions Per Provider Order: No      Mobility  Bed Mobility               General bed mobility comments: pt seated in recliner when PT arrived and returned to recliner at end of eval    Transfers Overall transfer level: Needs assistance Equipment used: Rolling walker (2 wheels), Straight cane Transfers: Sit to/from Stand Sit to Stand: Supervision           General transfer comment: pt required min cues for safety and proper UE placement for RW with inital sit to stand from recliner, use of SPC to return to recliner with min cues    Ambulation/Gait Ambulation/Gait assistance: Supervision, Contact guard assist Gait Distance (Feet): 500 Feet Assistive device: Rolling walker (2 wheels), None, Straight cane Gait Pattern/deviations: Step-through pattern Gait velocity: decreased     General Gait Details: gait initally assessed with RW and pt demonstrated good reciprocal pattern, LEs passing in stance phase, required cues for posture and proper RW management with no overt LOB noted ~200 feet, gait then assessed no AD and CGA with pt exhibiting B LE instabiltiy, high guard with B UE and lateral sway with shortened stride length and decreased gait velocity ~ 100 feet and remaining 200 feet of gait training with SPC with CGA and progressing to close S noted improved stabiltiy, pt exhibiting proper sequencing and cane placement with min  cues and no overt LOB PT recommends use of SPC for safety with household navigation at time of d/c pt indicates having SPC in home  Stairs            Wheelchair Mobility     Tilt Bed    Modified Rankin (Stroke Patients Only)       Balance Overall balance assessment: Mild deficits observed, not formally tested (no fall hx)                                            Pertinent Vitals/Pain Pain Assessment Pain Assessment: No/denies pain    Home Living Family/patient expects to be discharged to:: Private residence Living Arrangements: Spouse/significant other Available Help at Discharge: Family Type of Home: House Home Access: Stairs to enter Entrance Stairs-Rails: Right;Left;Can reach both Entrance Stairs-Number of Steps: 3   Home Layout: Two level;Able to live on main level with bedroom/bathroom Home Equipment: Rolling Walker (2 wheels);Cane - single point      Prior Function Prior Level of Function : Independent/Modified Independent;Working/employed             Mobility Comments: IND no AD for all ADL self care tasks and IADLs ADLs Comments: pt reports being primary caregiver for spouse whom has dementia     Extremity/Trunk Assessment        Lower Extremity Assessment Lower Extremity Assessment: Overall WFL for tasks assessed    Cervical / Trunk Assessment Cervical / Trunk Assessment: Normal  Communication   Communication Communication: No apparent difficulties    Cognition Arousal: Alert Behavior During Therapy: WFL for tasks assessed/performed   PT - Cognitive impairments: No apparent impairments                         Following commands: Intact       Cueing       General Comments      Exercises     Assessment/Plan    PT Assessment Patient needs continued PT services  PT Problem List Decreased activity tolerance;Decreased balance;Decreased mobility;Decreased coordination       PT Treatment Interventions DME instruction;Gait training;Stair training;Functional mobility training;Therapeutic activities;Therapeutic exercise;Balance training;Neuromuscular re-education;Patient/family education    PT Goals (Current goals can be found in the Care Plan section)  Acute Rehab PT Goals Patient Stated Goal: to be able to go home and take care of my wife PT Goal Formulation:  With patient Time For Goal Achievement: 04/01/24 Potential to Achieve Goals: Good    Frequency Min 3X/week     Co-evaluation               AM-PAC PT 6 Clicks Mobility  Outcome Measure Help needed turning from your back to your side while in a flat bed without using bedrails?: None Help needed moving from lying on your back to sitting on the side of a flat bed without using bedrails?: A Little Help needed moving to and from a bed to a chair (including a wheelchair)?: A Little Help needed standing up from a chair using your arms (e.g., wheelchair or bedside chair)?: A Little Help needed to walk in hospital room?: A Little Help needed climbing 3-5 steps with a railing? : A Lot 6 Click Score: 18    End of Session Equipment Utilized During Treatment: Gait belt Activity Tolerance: Patient tolerated treatment well;No increased pain Patient  left: in chair;with call bell/phone within reach;with chair alarm set Nurse Communication: Mobility status PT Visit Diagnosis: Unsteadiness on feet (R26.81);Other abnormalities of gait and mobility (R26.89);Difficulty in walking, not elsewhere classified (R26.2)    Time: 8867-8851 PT Time Calculation (min) (ACUTE ONLY): 16 min   Charges:   PT Evaluation $PT Eval Low Complexity: 1 Low   PT General Charges $$ ACUTE PT VISIT: 1 Visit         [place], PT Acute Rehab   [place] VEAR Drone 03/19/2024, 12:28 PM

## 2024-03-19 NOTE — Progress Notes (Addendum)
 Discharge instructions were reviewed with the patients son, Medford. He denied questions, or concerns at this time. The pt remains stable, no change from am assessment.

## 2024-03-20 ENCOUNTER — Telehealth: Payer: Self-pay

## 2024-03-20 NOTE — Discharge Summary (Signed)
 Physician Discharge Summary   Patient: Anthony [name] MRN: [id number] DOB: Jul 26, 1946  Admit date:     03/17/2024  Discharge date: 03/19/2024  Discharge Physician: Elgie Butter   PCP: [name], MD   Recommendations at discharge:  Please follow up with PCP in one week Please obtain cbc and bmp in one week.  We held your olmesartan  and hydrochlorothiazide  for a week due to AKI. Restart in one week after checking BMP to make sure  your renal parameters are okay.   Discharge Diagnoses: Principal Problem:   AKI (acute kidney injury) ([name])  Resolved Problems:   * No resolved hospital problems. *  Hospital Course: Anthony [name] is a 78 y.o. male with medical history significant of  DMII, GERD, COPD,  MCI,CAD s/p CABG, HTN, HLD, interim history of TURBT on 02/05/24,who presents to ED in referral from due persistent weakness and fatigue with noted abnormal outpatient labs  noting AKI. Patient denies any fever / or chills N/V.   Assessment and Plan:  Acute Kidney Injury:  Suspect from poor oral intake.  Admitted with a creatinine of 3.57, improved to 1.98. to 1 on the day of discharge.  CT abdomen and pelvis showed  No acute findings. Prostatomegaly, suggesting BPH. Suspected postsurgical changes related to prior TURP.  We held your olmesartan  and hydrochlorothiazide  for a week due to AKI. Restart in one week after checking BMP to make sure  your renal parameters are okay.    Hypokalemia Replaced.    Type 2 Diabetes mellitus with hyperglycemia.  Resume home meds on discharge.    Hyperlipidemia Resume statin 20 mg daily.      Dementia On aricept .      Hypertension:  Well controlled.    GERD stable.      Copd; No wheezing heard.    Hypernatremia Resolved.      Estimated body mass index is 26.18 kg/m as calculated from the following:   Height as of this encounter: 6' (1.829 m).   Weight as of this encounter: 87.5 kg.     Consultants: none.   Procedures performed: none.   Disposition: Home Diet recommendation:  Discharge Diet Orders (From admission, onward)     Start     Ordered   03/19/24 0000  Diet - low sodium heart healthy        03/19/24 1455           Regular diet DISCHARGE MEDICATION: Allergies as of 03/19/2024   No Known Allergies      Medication List     PAUSE taking these medications    olmesartan -hydrochlorothiazide  40-25 MG tablet Wait to take this until: March 26, 2024 Commonly known as: BENICAR  HCT TAKE 1 TABLET BY MOUTH EVERY DAY       STOP taking these medications    naproxen  500 MG tablet Commonly known as: Naprosyn    tadalafil  20 MG tablet Commonly known as: CIALIS        TAKE these medications    amLODipine  5 MG tablet Commonly known as: NORVASC  Take 1 tablet (5 mg total) by mouth daily. For blood pressure   amoxicillin -clavulanate 875-125 MG tablet Commonly known as: AUGMENTIN  Take 1 tablet by mouth 2 (two) times daily with a meal.   aspirin  EC 81 MG tablet Take 1 tablet (81 mg total) by mouth daily. Swallow whole.   Dexcom G7 Sensor Misc Inject 1 Device into the skin See admin instructions. Place 1 new sensor into the skin every 10 days  donepezil  10 MG tablet Commonly known as: ARICEPT  Take 1 tablet (10 mg total) by mouth at bedtime.   feeding supplement Liqd Take 237 mLs by mouth 2 (two) times daily between meals.   glucose 4 GM chewable tablet Chew 4 g by mouth as needed for low blood sugar.   Gvoke HypoPen  1-Pack 1 MG/0.2ML Soaj Generic drug: Glucagon  Inject under skin 0.2 mL as needed for hypoglycemia   Insulin  Pen Needle 31G X 5 MM Misc Commonly known as: Advocate Insulin  Pen Needles Use 3x a day   metFORMIN  500 MG 24 hr tablet Commonly known as: GLUCOPHAGE -XR Take 1 tablet (500 mg total) by mouth 2 (two) times daily with a meal. Start taking on: March 21, 2024 What changed:  when to take this additional instructions   NovoLOG  FlexPen 100  UNIT/ML FlexPen Generic drug: insulin  aspart INJECT 12-18 UNITS INTO THE SKIN 3 TIMES DAILY BEFORE MEALS PER SLIDING SCALE: 10 UNITS 100-200, 15 UNITS 200-300, 18 UNITS >300 CBG   OneTouch Ultra test strip Generic drug: glucose blood USE TO check blood glucose four times daily AS DIRECTED   pantoprazole  40 MG tablet Commonly known as: PROTONIX  TAKE 1 TABLET BY MOUTH EVERY DAY What changed: when to take this   simvastatin  20 MG tablet Commonly known as: ZOCOR  TAKE 1 TABLET AT BEDTIME   traMADol  50 MG tablet Commonly known as: ULTRAM  Take 50 mg by mouth 2 (two) times daily as needed for moderate pain (pain score 4-6).   Tresiba  FlexTouch 200 UNIT/ML FlexTouch Pen Generic drug: insulin  degludec Inject 32 Units into the skin daily.   Tylenol  325 MG tablet Generic drug: acetaminophen  Take 325 mg by mouth in the morning and at bedtime.        Discharge Exam: Filed Weights   03/18/24 0052  Weight: 87.5 kg   General exam: Appears calm and comfortable  Respiratory system: Clear to auscultation. Respiratory effort normal. Cardiovascular system: S1 & S2 heard, RRR. No JVD,  Gastrointestinal system: Abdomen is nondistended, soft and nontender.  Central nervous system: Alert and oriented. No focal neurological deficits. Extremities: Symmetric 5 x 5 power. Skin: No rashes, Psychiatry: Mood & affect appropriate.    Condition at discharge: fair  The results of significant diagnostics from this hospitalization (including imaging, microbiology, ancillary and laboratory) are listed below for reference.   Imaging Studies: CT ABDOMEN PELVIS WO CONTRAST Result Date: 03/17/2024 EXAM: CT ABDOMEN AND PELVIS WITHOUT CONTRAST 03/17/2024 09:09:45 PM TECHNIQUE: CT of the abdomen and pelvis was performed without the administration of intravenous contrast. Multiplanar reformatted images are provided for review. Automated exposure control, iterative reconstruction, and/or weight based  adjustment of the mA/kV was utilized to reduce the radiation dose to as low as reasonably achievable. COMPARISON: None available. CLINICAL HISTORY: Abdominal pain, acute, nonlocalized. Patient had bladder surgery about 6 weeks ago for hematuria. Surgery to remove mass in bladder. Has been having hematuria again last week, 7 days ago. Seen at UC last Friday, 3 days ago and had UA collected. Negative for UTI per family. Per family, patient is continuing to get weaker and having brain fog, and just not himself. Went back to UC again today and had bloodwork. UC informed patient to come to ED for abnormal labs. BUN 110 and Cre 3.7. Still having hematuria and bladder incontinence. Patient pain at this time. Does complain of painful urination, burning sensation. FINDINGS: LOWER CHEST: No acute abnormality. LIVER: The liver is unremarkable. GALLBLADDER AND BILE DUCTS: Status post cholecystectomy. SPLEEN:  No acute abnormality. PANCREAS: No acute abnormality. ADRENAL GLANDS: No acute abnormality. KIDNEYS, URETERS AND BLADDER: Subcentimeter left lower pole renal cyst, benign (Bosniak 1). No follow up is recommended. No stones in the kidneys or ureters. No hydronephrosis. No perinephric or periureteral stranding. Urinary bladder is unremarkable. GI AND BOWEL: Status post appendectomy. Mild left colonic diverticulosis, with evidence of diverticulitis. Stomach demonstrates no acute abnormality. There is no bowel obstruction. No bowel wall thickening. PERITONEUM AND RETROPERITONEUM: No ascites. No free air. VASCULATURE: Atherosclerotic calcifications of the abdominal aorta and branch vessels. LYMPH NODES: No lymphadenopathy. REPRODUCTIVE ORGANS: Prostatomegaly, with enlargement of the central gland indenting the base of the bladder, suggesting BPH. Suspected postsurgical changes related to prior TURP. BONES AND SOFT TISSUES: No acute osseous abnormality. Post surgical changes related to bilateral inguinal hernia repair.  IMPRESSION: 1. No acute findings. 2. Prostatomegaly, suggesting BPH. Suspected postsurgical changes related to prior TURP. 3. Mild left colonic diverticulosis, with evidence of diverticulitis. Electronically signed by: Pinkie Pebbles MD 03/17/2024 09:15 PM EDT RP Workstation: [id number]    Microbiology: Results for orders placed or performed during the hospital encounter of 03/17/24  Urine Culture     Status: None   Collection Time: 03/18/24  8:02 AM   Specimen: Urine, Clean Catch  Result Value Ref Range Status   Specimen Description   Final    URINE, CLEAN CATCH Performed at [facility], [address], [place], [place] [address]    Special Requests   Final    NONE Performed at [facility], [address], [place], [place] [address]    Culture   Final    NO GROWTH Performed at [facility], [address] [place], [place] [address]    Report Status 03/19/2024 FINAL  Final    Labs: CBC: Recent Labs  Lab 03/17/24 1748 03/18/24 0631 03/19/24 0631  WBC 9.0 6.0 5.5  NEUTROABS  --  4.0  --   HGB 13.5 14.1 12.4*  HCT 41.5 42.6 36.9*  MCV 87.9 87.1 87.4  PLT 232 209 195   Basic Metabolic Panel: Recent Labs  Lab 03/17/24 1748 03/18/24 0631 03/19/24 0631 03/19/24 1158  NA 136 141 150* 141  K 3.7 3.1* 3.3*  --   CL 103 110 117*  --   CO2 20* 21* 23  --   GLUCOSE 268* 186* 109*  --   BUN 104* 81* 45*  --   CREATININE 3.57* 1.98* 0.97  --   CALCIUM 9.1 9.6 9.4  --   MG  --  2.3  --   --    Liver Function Tests: Recent Labs  Lab 03/18/24 0631 03/19/24 0631  AST 11* 12*  ALT 13 13  ALKPHOS 58 49  BILITOT 0.7 0.8  PROT 6.8 5.9*  ALBUMIN 4.1 3.5   CBG: Recent Labs  Lab 03/18/24 1141 03/18/24 1617 03/18/24 2103 03/19/24 0743 03/19/24 1207  GLUCAP 263* 369* 422* 94 250*    Discharge time spent: 40 minutes.   Signed: Elgie Butter, MD [facility] 03/20/2024

## 2024-03-20 NOTE — Patient Instructions (Signed)
 Visit Information  Thank you for taking time to visit with me today. Please don't hesitate to contact me if I can be of assistance to you before our next scheduled telephone appointment.  Our next appointment is by telephone on 03/27/24 at 11 am  Following is a copy of your care plan:   Goals Addressed             This Visit's Progress    VBCI Transitions of Care (TOC) Care Plan       Problems:  Recent Hospitalization for treatment of acute kidney injury Knowledge Deficit Related to acute kidney injury and has mild cognitive impairment- on Aricept   Goal:  Over the next 30 days, the patient will not experience hospital readmission  Interventions:  Transitions of Care: Doctor Visits  - discussed the importance of doctor visits Assessed for ongoing symptoms related to AKI. Assessed for new symptoms Discussed recent CMP and sodium level Reviewed upcoming provider visits Assessed for social support  Advised that patient should stay hydrated Reviewed medications and medication adjustments. Discussed importance of medication compliance. Advised to take medication to completion Advised to call primary care provider for new or ongoing symptoms. Advised to call 911 for severe symptoms such as SOB, chest pain, stroke like symptoms.  Provided name and contact phone number for this RN case manager. Discussed and offered 30 day TOC program  Patient Self Care Activities:  Attend all scheduled provider appointments Call pharmacy for medication refills 3-7 days in advance of running out of medications Call provider office for new concerns or questions  Notify RN Care Manager of [facility] call rescheduling needs Take medications as prescribed   Call primary care provider for any new or ongoing symptoms Call 911 for severe symptoms such as shortness of breath, chest pain, stroke like symptoms.   Plan:  Telephone follow up appointment with care management team member scheduled for:  03/27/24 at 11  am        Patient verbalizes understanding of instructions and care plan provided today and agrees to view in MyChart. Active MyChart status and patient understanding of how to access instructions and care plan via MyChart confirmed with patient.     The patient has been provided with contact information for the care management team and has been advised to call with any health related questions or concerns.  Patients daughter was provided contact information .   Please call the care guide team at [phone] if you need to cancel or reschedule your appointment.   Please call the Suicide and Crisis Lifeline: 988 call 1-800-273-TALK (toll free, 24 hour hotline) if you are experiencing a Mental Health or Behavioral Health Crisis or need someone to talk to.  Arvin Seip RN, BSN, CCM [employer], Population Health Case Manager Phone: [phone]

## 2024-03-20 NOTE — Transitions of Care (Post Inpatient/ED Visit) (Signed)
 03/20/2024  Name: Anthony [name] MRN: [id number] DOB: 1945/10/06  Today's TOC FU Call Status: Today's TOC FU Call Status:: Successful TOC FU Call Completed TOC FU Call Complete Date: 03/20/24 Patient's Name and Date of Birth confirmed.  Transition Care Management Follow-up Telephone Call Date of Discharge: 03/19/24 Discharge Facility: [facility]) Type of Discharge: Inpatient Admission Primary Inpatient Discharge Diagnosis:: Acute kidney injury How have you been since you were released from the hospital?: Better (Daughter states he is doing better. Getting around without walker now. Was offered home health PT however declined.) Any questions or concerns?: No  Items Reviewed: Did you receive and understand the discharge instructions provided?: Yes Medications obtained,verified, and reconciled?: Yes (Medications Reviewed) Any new allergies since your discharge?: No Dietary orders reviewed?: Yes Type of Diet Ordered:: low salt heart healthy Do you have support at home?: Yes People in Home [RPT]: child(ren), adult Name of Support/Comfort Primary Source: Anthony [name] - daughter and Anthony [name] - son  Medications Reviewed Today: Medications Reviewed Today     Reviewed by Perris Conwell E, RN (Registered Nurse) on 03/20/24 at 1338  Med List Status: <None>   Medication Order Taking? Sig Documenting Provider Last Dose Status Informant  amLODipine  (NORVASC ) 5 MG tablet [id number] Yes Take 1 tablet (5 mg total) by mouth daily. For blood pressure [name], MD  Active Family Member  amoxicillin -clavulanate (AUGMENTIN ) 875-125 MG tablet [id number] Yes Take 1 tablet by mouth 2 (two) times daily with a meal. [provider]  Active Family Member  aspirin  EC 81 MG tablet [id number] Yes Take 1 tablet (81 mg total) by mouth daily. Swallow whole. Santo Stanly LABOR, MD  Active Family Member           Med Note MARISA, NATHANEL SAILOR   Tue Mar 18, 2024  7:27 PM)    Continuous  Blood Gluc Sensor (DEXCOM G7 SENSOR) [place] [id number]  Inject 1 Device into the skin See admin instructions. Place 1 new sensor into the skin every 10 days [provider]  Active Family Member  donepezil  (ARICEPT ) 10 MG tablet [id number] Yes Take 1 tablet (10 mg total) by mouth at bedtime. [name], MD  Active Family Member  feeding supplement (ENSURE PLUS HIGH PROTEIN) LIQD [id number]  Take 237 mLs by mouth 2 (two) times daily between meals. Cherlyn Labella, MD  Active   glucose 4 GM chewable tablet [id number] Yes Chew 4 g by mouth as needed for low blood sugar. [provider]  Active Family Member  GVOKE HYPOPEN  1-PACK 1 MG/0.2ML EMMANUEL [id number]  Inject under skin 0.2 mL as needed for hypoglycemia Trixie File, MD  Active Family Member  insulin  aspart (NOVOLOG  FLEXPEN) 100 UNIT/ML FlexPen [id number] Yes INJECT 12-18 UNITS INTO THE SKIN 3 TIMES DAILY BEFORE MEALS PER SLIDING SCALE: 10 UNITS 100-200, 15 UNITS 200-300, 18 UNITS >300 CBG [name], MD  Active Family Member  insulin  degludec (TRESIBA  FLEXTOUCH) 200 UNIT/ML FlexTouch Pen [id number] Yes Inject 32 Units into the skin daily. [name], MD  Active Family Member  Insulin  Pen Needle (ADVOCATE INSULIN  PEN NEEDLES) 31G X 5 MM MISC [id number]  Use 3x a day Trixie File, MD  Active   metFORMIN  (GLUCOPHAGE -XR) 500 MG 24 hr tablet [id number]  Take 1 tablet (500 mg total) by mouth 2 (two) times daily with a meal.  Patient not taking: Reported on 03/20/2024   Akula, Vijaya, MD  Active   olmesartan -hydrochlorothiazide  (BENICAR  HCT) 40-25 MG tablet [id number]  TAKE 1 TABLET  BY MOUTH EVERY DAY [name], MD  Active Family Member  [facility] ULTRA test strip [id number]  USE TO check blood glucose four times daily AS DIRECTED Trixie File, MD  Active   pantoprazole  (PROTONIX ) 40 MG tablet [id number] Yes TAKE 1 TABLET BY MOUTH EVERY DAY [name], MD  Active Family Member  simvastatin  (ZOCOR ) 20 MG  tablet [id number] Yes TAKE 1 TABLET AT BEDTIME [name], MD  Active Family Member  traMADol  (ULTRAM ) 50 MG tablet [id number] Yes Take 50 mg by mouth 2 (two) times daily as needed for moderate pain (pain score 4-6). [provider]  Active Family Member  TYLENOL  325 MG tablet [id number] Yes Take 325 mg by mouth in the morning and at bedtime. [provider]  Active Family Member            Home Care and Equipment/Supplies: Were Home Health Services Ordered?: No (per chart review patient was offered referral for home health PT however patient declined.) Any new equipment or medical supplies ordered?: No  Functional Questionnaire: Do you need assistance with bathing/showering or dressing?: No Do you need assistance with meal preparation?: Yes (children and aide assist with meal preparation) Do you need assistance with eating?: No Do you have difficulty maintaining continence: No Do you need assistance with getting out of bed/getting out of a chair/moving?: No Do you have difficulty managing or taking your medications?: Yes (Daughter states patients  son assist with medication management.)  Follow up appointments reviewed: PCP Follow-up appointment confirmed?: Yes Date of PCP follow-up appointment?: 03/21/24 Follow-up Provider: Dr. Baller Rilla Specialist [facility] Follow-up appointment confirmed?: Yes Date of Specialist follow-up appointment?: 03/25/24 Follow-Up Specialty Provider:: Dr. Twylla Do you need transportation to your follow-up appointment?: No Do you understand care options if your condition(s) worsen?: Yes-patient verbalized understanding  SDOH Interventions Today    Flowsheet Row Most Recent Value  SDOH Interventions   Food Insecurity Interventions Intervention Not Indicated  Housing Interventions Intervention Not Indicated  Transportation Interventions Intervention Not Indicated  Utilities Interventions Intervention Not Indicated     Goals      Caregiver support     Care Coordination Interventions:  Follow up phone call to patient to assess for community resource and caregiver support needs Discussed Day Program  options- specifically [facility] to support her social, physical and emotional well being as well as caregiver support and respite care.  -contact information provided as well as added patient to the list to receive newsletters related to services offered Continued to discuss considering a private duty aid to assist with ADL's as well Patient confirmed having no immediate needs at this time and will use resources provided if needed in the future      Patient Stated     Starting 01/09/2019, I will continue to taking medication as prescribed.      Patient Stated     01/12/2020, I will maintain and continue medications as prescribed.     Patient Stated     04/13/2022, start exercising regularly     Patient Stated: Controlling my blood sugars     Interventions Today    Flowsheet Row Most Recent Value  Chronic Disease   Chronic disease during today's visit Diabetes, Other  [urinary symptoms.]  General Interventions   General Interventions Discussed/Reviewed General Interventions Reviewed, Doctor Visits  [evaluation of current treatment plan for diabetes/ urinary symptoms and patients adherence to plan as established by provider.  Assessed for blood sugar readings. Assessed for urinary symptoms.]  Doctor  Visits Discussed/Reviewed Doctor Visits Reviewed  [provider visits reviewed and discussed. Pt. advised that provider should be notified of blood in urine. RNCM offered to contact PCP office and scheduled visit to be seen by provider for blood in urine.  Message sent to PCP regarding blood in pt. urine.]  Pharmacy Interventions   Pharmacy Dicussed/Reviewed Pharmacy Topics Reviewed  [medications reviewed and compliance discussed. Confirmed patient not taking Mounjara as discussed with pharmacist. Discussed with   patient taking sliding scale insulin  as prescribed.]             VBCI Transitions of Care (TOC) Care Plan     Problems:  Recent Hospitalization for treatment of acute kidney injury Knowledge Deficit Related to acute kidney injury and has mild cognitive impairment- on Aricept   Goal:  Over the next 30 days, the patient will not experience hospital readmission  Interventions:  Transitions of Care: Doctor Visits  - discussed the importance of doctor visits Assessed for ongoing symptoms related to AKI. Assessed for new symptoms Discussed recent CMP and sodium level Reviewed upcoming provider visits Assessed for social support  Advised that patient should stay hydrated Reviewed medications and medication adjustments. Discussed importance of medication compliance. Advised to take medication to completion Advised to call primary care provider for new or ongoing symptoms. Advised to call 911 for severe symptoms such as SOB, chest pain, stroke like symptoms.  Provided name and contact phone number for this RN case manager. Discussed and offered 30 day TOC program  Patient Self Care Activities:  Attend all scheduled provider appointments Call pharmacy for medication refills 3-7 days in advance of running out of medications Call provider office for new concerns or questions  Notify RN Care Manager of [facility] call rescheduling needs Take medications as prescribed   Call primary care provider for any new or ongoing symptoms Call 911 for severe symptoms such as shortness of breath, chest pain, stroke like symptoms.   Plan:  Telephone follow up appointment with care management team member scheduled for:  03/27/24 at 11 am        Arvin Seip RN, BSN, CCM [place]  [facility], Population Health Case Manager Phone: [phone]

## 2024-03-21 ENCOUNTER — Ambulatory Visit: Admitting: Family Medicine

## 2024-03-21 ENCOUNTER — Encounter: Payer: Self-pay | Admitting: Family Medicine

## 2024-03-21 VITALS — BP 120/62 | HR 71 | Temp 98.5°F | Ht 72.0 in | Wt 199.4 lb

## 2024-03-21 DIAGNOSIS — R310 Gross hematuria: Secondary | ICD-10-CM | POA: Diagnosis not present

## 2024-03-21 DIAGNOSIS — E11649 Type 2 diabetes mellitus with hypoglycemia without coma: Secondary | ICD-10-CM | POA: Diagnosis not present

## 2024-03-21 DIAGNOSIS — Z794 Long term (current) use of insulin: Secondary | ICD-10-CM

## 2024-03-21 DIAGNOSIS — N179 Acute kidney failure, unspecified: Secondary | ICD-10-CM

## 2024-03-21 LAB — POC URINALSYSI DIPSTICK (AUTOMATED)
Glucose, UA: POSITIVE — AB
Nitrite, UA: POSITIVE
Protein, UA: POSITIVE — AB
Spec Grav, UA: 1.02 (ref 1.010–1.025)
Urobilinogen, UA: 2 U/dL — AB
pH, UA: 6.5 (ref 5.0–8.0)

## 2024-03-21 LAB — COMPREHENSIVE METABOLIC PANEL WITH GFR
AG Ratio: 2 (calc) (ref 1.0–2.5)
ALT: 20 U/L (ref 9–46)
AST: 14 U/L (ref 10–35)
Albumin: 3.9 g/dL (ref 3.6–5.1)
Alkaline phosphatase (APISO): 66 U/L (ref 35–144)
BUN/Creatinine Ratio: 26 (calc) — ABNORMAL HIGH (ref 6–22)
BUN: 33 mg/dL — ABNORMAL HIGH (ref 7–25)
CO2: 27 mmol/L (ref 20–32)
Calcium: 9 mg/dL (ref 8.6–10.3)
Chloride: 105 mmol/L (ref 98–110)
Creat: 1.27 mg/dL (ref 0.70–1.28)
Globulin: 2 g/dL (ref 1.9–3.7)
Glucose, Bld: 202 mg/dL — ABNORMAL HIGH (ref 65–99)
Potassium: 3.6 mmol/L (ref 3.5–5.3)
Sodium: 140 mmol/L (ref 135–146)
Total Bilirubin: 0.4 mg/dL (ref 0.2–1.2)
Total Protein: 5.9 g/dL — ABNORMAL LOW (ref 6.1–8.1)
eGFR: 59 mL/min/1.73m2 — ABNORMAL LOW (ref >=60)

## 2024-03-21 LAB — CBC WITH DIFFERENTIAL/PLATELET
Absolute Lymphocytes: 1773 {cells}/uL (ref 850–3900)
Absolute Monocytes: 428 {cells}/uL (ref 200–950)
Basophils Absolute: 41 {cells}/uL (ref 0–200)
Basophils Relative: 0.6 %
Eosinophils Absolute: 297 {cells}/uL (ref 15–500)
Eosinophils Relative: 4.3 %
HCT: 34.9 % — ABNORMAL LOW (ref 38.5–50.0)
Hemoglobin: 11.2 g/dL — ABNORMAL LOW (ref 13.2–17.1)
MCH: 28.9 pg (ref 27.0–33.0)
MCHC: 32.1 g/dL (ref 32.0–36.0)
MCV: 90.2 fL (ref 80.0–100.0)
MPV: 11.2 fL (ref 7.5–12.5)
Monocytes Relative: 6.2 %
Neutro Abs: 4361 {cells}/uL (ref 1500–7800)
Neutrophils Relative %: 63.2 %
Platelets: 190 Thousand/uL (ref 140–400)
RBC: 3.87 Million/uL — ABNORMAL LOW (ref 4.20–5.80)
RDW: 12.6 % (ref 11.0–15.0)
Total Lymphocyte: 25.7 %
WBC: 6.9 Thousand/uL (ref 3.8–10.8)

## 2024-03-21 NOTE — Progress Notes (Addendum)
 Ph: [phone] Fax: [phone]   Patient ID: Anthony [name], male    DOB: Jul 30, 1946, 78 y.o.   MRN: [id number]  This visit was conducted in person.  BP 120/62   Pulse 71   Temp 98.5 F (36.9 C) (Oral)   Ht 6' (1.829 m)   Wt 199 lb 6 oz (90.4 kg)   SpO2 98%   BMI 27.04 kg/m    CC: hosp f/u visit  Subjective:   HPI: Anthony [name] is a 78 y.o. male presenting on 03/21/2024 for Medical Management of Chronic Issues (In office today with son Anthony [name] and wife Anthony [name] ) and Hospitalization Follow-up (Discharged from [facility] 03/19/24)   Recent hospitalization for weakness, fatigue and renal failure after TURBT performed on 02/05/2024. Pathology did not show bladder cancer (benign urothelial tissue with reactive urothelial changes). Creatinine up to 3.57, improved to 0.97 on discharge.  Hospital records reviewed. Med rec performed.  UCx returned negative for infection CT abd/pelvis without acute process, did have prostatomegaly and post-surgical TURP changes.  Olmesartan  and hydrochlorothiazide  held for a week.  Previously naprosyn  500mg  started due to worsening R lumbar radiculopathy and end stage knee arthritis - now off this.   He's continued urinating blood. Ongoing urinary incontinence. Using depends. He feels he's drinking more water . Also likes diet coke  Notes ongoing nocturia, urgency, frequency, as well as dysuria.  Continues taking augmentin  for total 10d course.  UCx at [facility] was negative UCx at hospital was negative as well.   DM - continues tresiba  32u daily, with novolog  flexpen 12-18u TID AC with SSI (10u 100-200, 15u 200-300, 18u >300) - pt unsure how he takes novolog  insulin  so family helps manage insulin  injections.  CGM data: dexcom G6 30 day average sugar: 213, time in range 40%, hypoglycemia 1%, stage 1 hyperglycemia 28%, stage 2 hyperglycemia 31%.   Lab Results  Component Value Date   HGBA1C 8.5 (H) 03/18/2024   Home health set up, pending appt.   Other follow up appointments scheduled: urology 03/25/2024 ______________________________________________________________________ Hospital admission: 03/17/2024 Hospital discharge: 03/19/2024 TCM f/u phone call:  performed 03/20/2024  Recommendations at discharge:  Please follow up with PCP in one week Please obtain cbc and bmp in one week.  We held your olmesartan  and hydrochlorothiazide  for a week due to AKI. Restart in one week after checking BMP to make sure  your renal parameters are okay.    Discharge Diagnoses: Principal Problem:   AKI (acute kidney injury) ([name]) Hypokalemia T2DM with hyperglycmia HLD Dementia HTN GERD COPD Hyponatremia      Relevant past medical, surgical, family and social history reviewed and updated as indicated. Interim medical history since our last visit reviewed. Allergies and medications reviewed and updated. Outpatient Medications Prior to Visit  Medication Sig Dispense Refill   amLODipine  (NORVASC ) 5 MG tablet Take 1 tablet (5 mg total) by mouth daily. For blood pressure 90 tablet 2   amoxicillin -clavulanate (AUGMENTIN ) 875-125 MG tablet Take 1 tablet by mouth 2 (two) times daily with a meal.     aspirin  EC 81 MG tablet Take 1 tablet (81 mg total) by mouth daily. Swallow whole. 30 tablet 12   Continuous Blood Gluc Sensor (DEXCOM G7 SENSOR) MISC Inject 1 Device into the skin See admin instructions. Place 1 new sensor into the skin every 10 days     donepezil  (ARICEPT ) 10 MG tablet Take 1 tablet (10 mg total) by mouth at bedtime. 90 tablet 1   feeding supplement (  ENSURE PLUS HIGH PROTEIN) LIQD Take 237 mLs by mouth 2 (two) times daily between meals. 14220 mL 2   glucose 4 GM chewable tablet Chew 4 g by mouth as needed for low blood sugar.     GVOKE HYPOPEN  1-PACK 1 MG/0.2ML SOAJ Inject under skin 0.2 mL as needed for hypoglycemia 0.2 mL 11   insulin  aspart (NOVOLOG  FLEXPEN) 100 UNIT/ML FlexPen INJECT 12-18 UNITS INTO THE SKIN 3 TIMES DAILY BEFORE  MEALS PER SLIDING SCALE: 10 UNITS 100-200, 15 UNITS 200-300, 18 UNITS >300 CBG 30 mL 3   Insulin  Pen Needle (ADVOCATE INSULIN  PEN NEEDLES) 31G X 5 MM MISC Use 3x a day 300 each 3   ONETOUCH ULTRA test strip USE TO check blood glucose four times daily AS DIRECTED 150 strip 3   pantoprazole  (PROTONIX ) 40 MG tablet TAKE 1 TABLET BY MOUTH EVERY DAY 90 tablet 0   simvastatin  (ZOCOR ) 20 MG tablet TAKE 1 TABLET AT BEDTIME 90 tablet 0   traMADol  (ULTRAM ) 50 MG tablet Take 50 mg by mouth 2 (two) times daily as needed for moderate pain (pain score 4-6).     TYLENOL  325 MG tablet Take 325 mg by mouth in the morning and at bedtime.     insulin  degludec (TRESIBA  FLEXTOUCH) 200 UNIT/ML FlexTouch Pen Inject 32 Units into the skin daily. 15 mL 1   olmesartan -hydrochlorothiazide  (BENICAR  HCT) 40-25 MG tablet TAKE 1 TABLET BY MOUTH EVERY DAY (Patient not taking: Reported on 03/21/2024) 90 tablet 0   metFORMIN  (GLUCOPHAGE -XR) 500 MG 24 hr tablet Take 1 tablet (500 mg total) by mouth 2 (two) times daily with a meal. (Patient not taking: Reported on 03/21/2024)     No facility-administered medications prior to visit.     Per HPI unless specifically indicated in ROS section below Review of Systems  Objective:  BP 120/62   Pulse 71   Temp 98.5 F (36.9 C) (Oral)   Ht 6' (1.829 m)   Wt 199 lb 6 oz (90.4 kg)   SpO2 98%   BMI 27.04 kg/m   Wt Readings from Last 3 Encounters:  03/21/24 199 lb 6 oz (90.4 kg)  03/18/24 193 lb (87.5 kg)  03/07/24 160 lb (72.6 kg)      Physical Exam Vitals and nursing note reviewed.  Constitutional:      Appearance: Normal appearance. He is not ill-appearing.  HENT:     Head: Normocephalic and atraumatic.     Mouth/Throat:     Mouth: Mucous membranes are moist.     Pharynx: Oropharynx is clear. No oropharyngeal exudate or posterior oropharyngeal erythema.  Eyes:     Extraocular Movements: Extraocular movements intact.     Pupils: Pupils are equal, round, and reactive to  light.  Cardiovascular:     Rate and Rhythm: Normal rate and regular rhythm.     Pulses: Normal pulses.     Heart sounds: Normal heart sounds. No murmur heard. Pulmonary:     Effort: Pulmonary effort is normal. No respiratory distress.     Breath sounds: Normal breath sounds. No wheezing, rhonchi or rales.  Musculoskeletal:     Cervical back: Normal range of motion and neck supple.     Right lower leg: No edema.     Left lower leg: No edema.  Skin:    General: Skin is warm and dry.     Findings: No rash.  Neurological:     Mental Status: He is alert.  Psychiatric:  Mood and Affect: Mood normal.        Behavior: Behavior normal.       Results for orders placed or performed in visit on 03/21/24  CBC with Differential/Platelet   Collection Time: 03/21/24  3:45 PM  Result Value Ref Range   WBC 6.9 3.8 - 10.8 Thousand/uL   RBC 3.87 (L) 4.20 - 5.80 Million/uL   Hemoglobin 11.2 (L) 13.2 - 17.1 g/dL   HCT 65.0 (L) 61.4 - 49.9 %   MCV 90.2 80.0 - 100.0 fL   MCH 28.9 27.0 - 33.0 pg   MCHC 32.1 32.0 - 36.0 g/dL   RDW 87.3 88.9 - 84.9 %   Platelets 190 140 - 400 Thousand/uL   MPV 11.2 7.5 - 12.5 fL   Neutro Abs 4,361 1,500 - 7,800 cells/uL   Absolute Lymphocytes 1,773 850 - 3,900 cells/uL   Absolute Monocytes 428 200 - 950 cells/uL   Eosinophils Absolute 297 15 - 500 cells/uL   Basophils Absolute 41 0 - 200 cells/uL   Neutrophils Relative % 63.2 %   Total Lymphocyte 25.7 %   Monocytes Relative 6.2 %   Eosinophils Relative 4.3 %   Basophils Relative 0.6 %  Comprehensive metabolic panel with GFR   Collection Time: 03/21/24  3:45 PM  Result Value Ref Range   Glucose, Bld 202 (H) 65 - 99 mg/dL   BUN 33 (H) 7 - 25 mg/dL   Creat 8.72 9.29 - 8.71 mg/dL   eGFR 59 (L) > OR = 60 mL/min/1.48m2   BUN/Creatinine Ratio 26 (H) 6 - 22 (calc)   Sodium 140 135 - 146 mmol/L   Potassium 3.6 3.5 - 5.3 mmol/L   Chloride 105 98 - 110 mmol/L   CO2 27 20 - 32 mmol/L   Calcium 9.0 8.6 -  10.3 mg/dL   Total Protein 5.9 (L) 6.1 - 8.1 g/dL   Albumin 3.9 3.6 - 5.1 g/dL   Globulin 2.0 1.9 - 3.7 g/dL (calc)   AG Ratio 2.0 1.0 - 2.5 (calc)   Total Bilirubin 0.4 0.2 - 1.2 mg/dL   Alkaline phosphatase (APISO) 66 35 - 144 U/L   AST 14 10 - 35 U/L   ALT 20 9 - 46 U/L  POCT Urinalysis Dipstick (Automated)   Collection Time: 03/21/24  4:02 PM  Result Value Ref Range   Color, UA dark red    Clarity, UA none    Glucose, UA Positive (A) Negative   Bilirubin, UA 3+    Ketones, UA 2+    Spec Grav, UA 1.020 1.010 - 1.025   Blood, UA 3+    pH, UA 6.5 5.0 - 8.0   Protein, UA Positive (A) Negative   Urobilinogen, UA 2.0 (A) 0.2 or 1.0 E.U./dL   Nitrite, UA positive    Leukocytes, UA Large (3+) (A) Negative  Urine Culture   Collection Time: 03/21/24  5:03 PM   Specimen: Urine  Result Value Ref Range   MICRO NUMBER: [id number]    SPECIMEN QUALITY: Adequate    Sample Source NOT GIVEN    STATUS: FINAL    Result: No Growth   Micro: WBC rare RBC TNTC Bact tr Casts none Epi rare UCx sent  Lab Results  Component Value Date   PSA 3.74 05/07/2023   PSA 2.73 04/14/2022   PSA 3.10 03/08/2021   Assessment & Plan:   Problem List Items Addressed This Visit     Type 2 diabetes mellitus with hypoglycemia ([name])  Chronic brittle insulin  dependent diabetes, also on metformin  XR 500mg  bid (currently on hold).  Will continue holding metformin  at this time until labs return. Discussed tresiba  and novolog  use as well as time above goal range being 59%. Continue tresiba  32u daily - they are hesitant to increase basal insulin  due to concerns over hypoglycemia. I will ask them to record fasting sugars over next 2 weeks and send me readings.       Relevant Medications   metFORMIN  (GLUCOPHAGE -XR) 500 MG 24 hr tablet   Gross hematuria   S/p TURBT 01/2024 - no cancer, just urothelial tissue with reactive changes. Bleed thought coming from prostatic urethral/friable BPH, s/p cauterization of  prominent blood vessels at bladder neck.  Notes ongoing gross hematuria.  Update UA and labs today.  UCx sent given abnormal UA.  Will recommend return to urology for ongoing gross hematuria - has appt scheduled for next week.  He continues augmentin  10d course started by [facility] ~03/14/2024.       Relevant Orders   Urine Culture (Completed)   AKI (acute kidney injury) ([name]) - Primary   Anticipate multifactorial including NSAID use, ARB/HCTZ use, dehydration, possible post-renal urinary retention from blood clots in recent tgrosshematuria.  Update labs today, continue holding metformin  and olmesartan /hydrochlorothiazide  until labs return.  Update UA today given newly endorsed dysuria.  He has been making a conscious effort to increase water  intake.       Relevant Orders   CBC with Differential/Platelet (Completed)   Comprehensive metabolic panel with GFR (Completed)   POCT Urinalysis Dipstick (Automated) (Completed)   Urine Culture (Completed)     Meds ordered this encounter  Medications   metFORMIN  (GLUCOPHAGE -XR) 500 MG 24 hr tablet    Sig: Take 1 tablet (500 mg total) by mouth daily with breakfast.    Dispense:  90 tablet    Refill:  1    Note new dose    Orders Placed This Encounter  Procedures   Urine Culture   CBC with Differential/Platelet   Comprehensive metabolic panel with GFR   POCT Urinalysis Dipstick (Automated)    Patient Instructions  Continue holding metformin  and olmesartan /hydrochlorothiazide   Labs today  Urinalysis today  You have option to increase tresiba  by 1-2 units if fasting sugar trending up >180 as we continue to hold metformin    Follow up plan: Return if symptoms worsen or fail to improve.  Anton Blas, MD

## 2024-03-21 NOTE — Patient Instructions (Addendum)
 Continue holding metformin  and olmesartan /hydrochlorothiazide   Labs today  Urinalysis today  You have option to increase tresiba  by 1-2 units if fasting sugar trending up >180 as we continue to hold metformin 

## 2024-03-22 LAB — URINE CULTURE
MICRO NUMBER:: 16746880
Result:: NO GROWTH
SPECIMEN QUALITY:: ADEQUATE

## 2024-03-23 ENCOUNTER — Encounter: Payer: Self-pay | Admitting: Family Medicine

## 2024-03-23 ENCOUNTER — Ambulatory Visit: Payer: Self-pay | Admitting: Family Medicine

## 2024-03-23 MED ORDER — METFORMIN HCL ER 500 MG PO TB24: 500.0000 mg | ORAL_TABLET | Freq: Every day | ORAL | 1 refills | Status: DC

## 2024-03-23 NOTE — Assessment & Plan Note (Addendum)
 Anticipate multifactorial including NSAID use, ARB/HCTZ use, dehydration, possible post-renal urinary retention from blood clots in recent tgrosshematuria.  Update labs today, continue holding metformin  and olmesartan /hydrochlorothiazide  until labs return.  Update UA today given newly endorsed dysuria.  He has been making a conscious effort to increase water  intake.

## 2024-03-23 NOTE — Assessment & Plan Note (Addendum)
 Chronic brittle insulin  dependent diabetes, also on metformin  XR 500mg  bid (currently on hold).  Will continue holding metformin  at this time until labs return. Discussed tresiba  and novolog  use as well as time above goal range being 59%. Continue tresiba  32u daily - they are hesitant to increase basal insulin  due to concerns over hypoglycemia. I will ask them to record fasting sugars over next 2 weeks and send me readings.

## 2024-03-23 NOTE — Assessment & Plan Note (Addendum)
 S/p TURBT 01/2024 - no cancer, just urothelial tissue with reactive changes. Bleed thought coming from prostatic urethral/friable BPH, s/p cauterization of prominent blood vessels at bladder neck.  Notes ongoing gross hematuria.  Update UA and labs today.  UCx sent given abnormal UA.  Will recommend return to urology for ongoing gross hematuria - has appt scheduled for next week.  He continues augmentin  10d course started by [facility] ~03/14/2024.

## 2024-03-25 ENCOUNTER — Ambulatory Visit: Admitting: Physician Assistant

## 2024-03-25 VITALS — BP 153/76 | HR 68

## 2024-03-25 DIAGNOSIS — R310 Gross hematuria: Secondary | ICD-10-CM

## 2024-03-25 DIAGNOSIS — N401 Enlarged prostate with lower urinary tract symptoms: Secondary | ICD-10-CM | POA: Diagnosis not present

## 2024-03-25 DIAGNOSIS — R350 Frequency of micturition: Secondary | ICD-10-CM | POA: Diagnosis not present

## 2024-03-25 LAB — URINALYSIS, COMPLETE
Bilirubin, UA: NEGATIVE
Glucose, UA: NEGATIVE
Ketones, UA: NEGATIVE
Nitrite, UA: NEGATIVE
Specific Gravity, UA: 1.02 (ref 1.005–1.030)
Urobilinogen, Ur: 0.2 mg/dL (ref 0.2–1.0)
pH, UA: 6 (ref 5.0–7.5)

## 2024-03-25 LAB — MICROSCOPIC EXAMINATION: WBC, UA: >30 /HPF — AB (ref 0–5)

## 2024-03-25 LAB — BLADDER SCAN AMB NON-IMAGING

## 2024-03-25 MED ORDER — FINASTERIDE 5 MG PO TABS: 5.0000 mg | ORAL_TABLET | Freq: Every day | ORAL | 3 refills | Status: AC

## 2024-03-25 NOTE — Progress Notes (Unsigned)
 03/25/2024 3:02 PM   Anthony [name] 1946/06/27 [id number]  CC: Chief Complaint  Patient presents with   Hematuria   HPI: Anthony [name] is a 78 y.o. male with a recent history of gross hematuria due to BPH/hypervascularity who presents today for evaluation of gross hematuria. He is accompanied today by his wife and son.  On chart review, it looks like he has been on finasteride  recently, but it may have been stopped.  He was recently admitted with AKI and fatigue. He has continued to have intermittent gross hematuria, though it appears to finally be clearing. They feel he is finally moving in the right direction. He admits to some slight dysuria today that was worse yesterday.   In-office UA today positive for trace protein, 2+ blood, and 1+ leukocytes; urine microscopy with >30 WBCs/HPF, 11-30 RBCs/HPF, and moderate bacteria. PVR 25mL.  PMH: Past Medical History:  Diagnosis Date   Aortic atherosclerosis ([name]) 02/28/2020   By CT   Barrett's esophagus 12/08/2015   By EGD 2010 [place])    Bladder mass    BPH with obstruction/lower urinary tract symptoms 2014   s/p TURP   Cervical spondylosis    COPD (chronic obstructive pulmonary disease) ([name]) 02/28/2020   By CT: diffuse bronchial wall thickening with mild centrilobular and paraseptal emphysema (01/2020)   Coronary artery calcification seen on CAT scan 02/28/2020   3v CAD - calcified atherosclerotic plaque in the left main, left anterior descending and left circumflex coronary arteries (01/2020)   Diabetes mellitus type 2 with complications ([name])    completed DMSE   DKA (diabetic ketoacidoses) 01/2010   in coma   ED (erectile dysfunction)    Ex-smoker 01/21/2020   Family history of adverse reaction to anesthesia    son stopped breathing during colonoscopy   GERD (gastroesophageal reflux disease)    Gross hematuria    HTN (hypertension)    Hyperlipidemia    controlled with medicine   Laceration of right hand  without foreign body 07/03/2023   MCI (mild cognitive impairment) with memory loss    Osteoarthritis    Rheumatic fever    maybe as child    Surgical History: Past Surgical History:  Procedure Laterality Date   ABI  12/2013   WNL   APPENDECTOMY     CATARACT EXTRACTION W/PHACO Right 12/01/2020   Procedure: CATARACT EXTRACTION PHACO AND INTRAOCULAR LENS PLACEMENT (IOC)  RIGHT VIVITY TORIC LENS DIABETIC;  Surgeon: Mittie Gaskin, MD;  Location: [facility];  Service: Ophthalmology;  Laterality: Right;  6.93 1:12.8 9.5%   CATARACT EXTRACTION W/PHACO Left 12/15/2020   Procedure: CATARACT EXTRACTION PHACO AND INTRAOCULAR LENS PLACEMENT (IOC)  VIVITY TORIC LENS DIABETIC LEFT EYE;  Surgeon: Mittie Gaskin, MD;  Location: [facility];  Service: Ophthalmology;  Laterality: Left;  3.47 1:03.8 5.5%   CHOLECYSTECTOMY     years ago   COLONOSCOPY  01/2009   TA, rec rpt 5 yrs [place])   COLONOSCOPY  01/2018   TAx2, diverticulosis, int hemorrhoids, rpt 5 yrs ([name])   CYSTOSCOPY WITH BIOPSY N/A 02/05/2024   Procedure: CYSTOSCOPY, WITH BIOPSY;  Surgeon: Twylla Glendia BROCKS, MD;  Location: [facility];  Service: Urology;  Laterality: N/A;   CYSTOSCOPY WITH FULGERATION N/A 02/05/2024   Procedure: CYSTOSCOPY, WITH BLADDER FULGURATION;  Surgeon: Twylla Glendia BROCKS, MD;  Location: [facility];  Service: Urology;  Laterality: N/A;   ESOPHAGOGASTRODUODENOSCOPY  01/2009   biopsy with Barrett's   ESOPHAGOGASTRODUODENOSCOPY  01/2018   WNL - no  barrett's ([name])   INGUINAL HERNIA REPAIR Bilateral 03/04/2020   Procedure: LAPAROSCOPIC BILATERAL INGUINAL HERNIA REPAIR WITH MESH;  Surgeon: Vernetta Berg, MD;  Location: [facility];  Service: General;  Laterality: Bilateral;   LAPAROSCOPIC APPENDECTOMY  07/05/2011   Procedure: APPENDECTOMY LAPAROSCOPIC;  Surgeon: Jina Nephew, MD;  Location: [facility];  Service: General;  Laterality: N/A;   MENISCUS REPAIR Right 2018   Norleen Gavel @ [facility]   TRANSURETHRAL RESECTION OF BLADDER TUMOR N/A 02/05/2024   Procedure: TURBT (TRANSURETHRAL RESECTION OF BLADDER TUMOR);  Surgeon: Twylla Glendia BROCKS, MD;  Location: [facility];  Service: Urology;  Laterality: N/A;   TRANSURETHRAL RESECTION OF PROSTATE N/A 07/28/2013   Procedure: TRANSURETHRAL RESECTION OF THE PROSTATE WITH GYRUS INSTRUMENTS;  Surgeon: Mark C Ottelin, MD   VASECTOMY     WISDOM TOOTH EXTRACTION      Home Medications:  Allergies as of 03/25/2024   No Known Allergies      Medication List        Accurate as of March 25, 2024  3:02 PM. If you have any questions, ask your nurse or doctor.          PAUSE taking these medications    olmesartan -hydrochlorothiazide  40-25 MG tablet Wait to take this until: March 26, 2024 Commonly known as: BENICAR  HCT TAKE 1 TABLET BY MOUTH EVERY DAY       TAKE these medications    amLODipine  5 MG tablet Commonly known as: NORVASC  Take 1 tablet (5 mg total) by mouth daily. For blood pressure   aspirin  EC 81 MG tablet Take 1 tablet (81 mg total) by mouth daily. Swallow whole.   Dexcom G7 Sensor Misc Inject 1 Device into the skin See admin instructions. Place 1 new sensor into the skin every 10 days   donepezil  10 MG tablet Commonly known as: ARICEPT  Take 1 tablet (10 mg total) by mouth at bedtime.   feeding supplement Liqd Take 237 mLs by mouth 2 (two) times daily between meals.   glucose 4 GM chewable tablet Chew 4 g by mouth as needed for low blood sugar.   Gvoke HypoPen  1-Pack 1 MG/0.2ML Soaj Generic drug: Glucagon  Inject under skin 0.2 mL as needed for hypoglycemia   Insulin  Pen Needle 31G X 5 MM Misc Commonly known as: Advocate Insulin  Pen Needles Use 3x a day   metFORMIN  500 MG 24 hr tablet Commonly known as: GLUCOPHAGE -XR Take 1 tablet (500 mg total) by mouth daily with breakfast.   NovoLOG  FlexPen 100 UNIT/ML FlexPen Generic drug: insulin  aspart INJECT 12-18 UNITS INTO THE SKIN 3 TIMES DAILY  BEFORE MEALS PER SLIDING SCALE: 10 UNITS 100-200, 15 UNITS 200-300, 18 UNITS >300 CBG   OneTouch Ultra test strip Generic drug: glucose blood USE TO check blood glucose four times daily AS DIRECTED   pantoprazole  40 MG tablet Commonly known as: PROTONIX  TAKE 1 TABLET BY MOUTH EVERY DAY   simvastatin  20 MG tablet Commonly known as: ZOCOR  TAKE 1 TABLET AT BEDTIME   traMADol  50 MG tablet Commonly known as: ULTRAM  Take 50 mg by mouth 2 (two) times daily as needed for moderate pain (pain score 4-6).   Tresiba  FlexTouch 200 UNIT/ML FlexTouch Pen Generic drug: insulin  degludec Inject 32 Units into the skin daily.   Tylenol  325 MG tablet Generic drug: acetaminophen  Take 325 mg by mouth in the morning and at bedtime.        Allergies:  No Known Allergies  Family History: Family History  Problem Relation  Age of Onset   Lymphoma Mother        Non-hodgkins   Coronary artery disease Mother    Heart disease Mother    Cancer Father    Alcohol abuse Father    Diabetes Brother    Pancreatic cancer Maternal Uncle    Diabetes Brother    Diabetes Brother    Diabetes Other    Colon cancer Neg Hx    Lung cancer Neg Hx    Esophageal cancer Neg Hx    Rectal cancer Neg Hx    Stomach cancer Neg Hx     Social History:   reports that he quit smoking about 12 years ago. His smoking use included cigarettes. He started smoking about 62 years ago. He has a 15 pack-year smoking history. He has never used smokeless tobacco. He reports current alcohol use of about 7.0 standard drinks of alcohol per week. He reports that he does not use drugs.  Physical Exam: BP (!) 153/76   Pulse 68   Constitutional:  Alert and oriented, no acute distress, nontoxic appearing HEENT: [place], AT Cardiovascular: No clubbing, cyanosis, or edema Respiratory: Normal respiratory effort, no increased work of breathing Skin: No rashes, bruises or suspicious lesions Neurologic: Grossly intact, no focal deficits,  moving all 4 extremities Psychiatric: Normal mood and affect  Laboratory Data: Results for orders placed or performed in visit on 03/25/24  Microscopic Examination   Collection Time: 03/25/24  2:47 PM   Urine  Result Value Ref Range   WBC, UA >30 (A) 0 - 5 /hpf   RBC, Urine 11-30 (A) 0 - 2 /hpf   Epithelial Cells (non renal) 0-10 0 - 10 /hpf   Bacteria, UA Moderate (A) None seen/Few  Urinalysis, Complete   Collection Time: 03/25/24  2:47 PM  Result Value Ref Range   Specific Gravity, UA 1.020 1.005 - 1.030   pH, UA 6.0 5.0 - 7.5   Color, UA Yellow Yellow   Appearance Ur Clear Clear   Leukocytes,UA 1+ (A) Negative   Protein,UA Trace Negative/Trace   Glucose, UA Negative Negative   Ketones, UA Negative Negative   RBC, UA 2+ (A) Negative   Bilirubin, UA Negative Negative   Urobilinogen, Ur 0.2 0.2 - 1.0 mg/dL   Nitrite, UA Negative Negative   Microscopic Examination See below:   CULTURE, URINE COMPREHENSIVE   Collection Time: 03/25/24  3:02 PM   Specimen: Urine   UR  Result Value Ref Range   Urine Culture, Comprehensive Preliminary report    Organism ID, Bacteria Comment   BLADDER SCAN AMB NON-IMAGING   Collection Time: 03/25/24  3:29 PM  Result Value Ref Range   Scan Result 25ml    Assessment & Plan:   1. Gross hematuria (Primary) Clearing, though UA is suspicious today. Will send for culture and treat as indicated. - Urinalysis, Complete - CULTURE, URINE COMPREHENSIVE  2. Benign prostatic hyperplasia with urinary frequency Emptying appropriately. I recommended resuming finasteride  to reduce his risk for prostate bleeding in the future. We discussed that this can take several months to work and that he may have ED, gynecomastia as side effects. - BLADDER SCAN AMB NON-IMAGING - finasteride  (PROSCAR ) 5 MG tablet; Take 1 tablet (5 mg total) by mouth daily.  Dispense: 90 tablet; Refill: 3   Return for Keep follow-up as scheduled.  Lucie Hones, PA-C  [facility] [place] [address], [address] [place], [place] [address] [phone]

## 2024-03-27 NOTE — Progress Notes (Signed)
 This encounter was created in error - please disregard.

## 2024-03-29 LAB — CULTURE, URINE COMPREHENSIVE

## 2024-04-02 ENCOUNTER — Ambulatory Visit: Payer: Self-pay | Admitting: Physician Assistant

## 2024-05-08 ENCOUNTER — Ambulatory Visit (INDEPENDENT_AMBULATORY_CARE_PROVIDER_SITE_OTHER)

## 2024-05-08 VITALS — BP 153/76 | Ht 72.0 in | Wt 199.0 lb

## 2024-05-08 DIAGNOSIS — Z0000 Encounter for general adult medical examination without abnormal findings: Secondary | ICD-10-CM

## 2024-05-08 NOTE — Progress Notes (Signed)
 Because this visit was a virtual/telehealth visit,  certain criteria was not obtained, such a blood pressure, CBG if applicable, and timed get up and go. Any medications not marked as taking were not mentioned during the medication reconciliation part of the visit. Any vitals not documented were not able to be obtained due to this being a telehealth visit or patient was unable to self-report a recent blood pressure reading due to a lack of equipment at home via telehealth. Vitals that have been documented are verbally provided by the patient.   This visit was performed by a medical professional under my direct supervision. I was immediately available for consultation/collaboration. I have reviewed and agree with the Annual Wellness Visit documentation.  Subjective:   Anthony [name] is a 78 y.o. who presents for a Medicare Wellness preventive visit.  As a reminder, Annual Wellness Visits don't include a physical exam, and some assessments may be limited, especially if this visit is performed virtually. We may recommend an in-person follow-up visit with your provider if needed.  Visit Complete: Virtual I connected with  Anthony [name] on 05/08/24 by a video and audio enabled telemedicine application and verified that I am speaking with the correct person using two identifiers.  Patient Location: Home  Provider Location: Home Office  I discussed the limitations of evaluation and management by telemedicine. The patient expressed understanding and agreed to proceed.  Vital Signs: Because this visit was a virtual/telehealth visit, some criteria may be missing or patient reported. Any vitals not documented were not able to be obtained and vitals that have been documented are patient reported.  Persons Participating in Visit: Patient.  AWV Questionnaire: No: Patient Medicare AWV questionnaire was not completed prior to this visit.  Cardiac Risk Factors include: advanced age (>109men, >81  women);male gender;diabetes mellitus;hypertension     Objective:    Today's Vitals   05/08/24 1549 05/08/24 1551  BP: (!) 153/76   Weight: 199 lb (90.3 kg)   Height: 6' (1.829 m)   PainSc:  6    Body mass index is 26.99 kg/m.     05/08/2024    3:55 PM 03/17/2024    5:31 PM 02/05/2024    6:21 AM 01/30/2024    4:53 PM 06/30/2023    6:16 PM 04/13/2022    2:08 PM 02/15/2021    1:01 PM  Advanced Directives  Does Patient Have a Medical Advance Directive? Yes No Yes Yes Yes No No;Yes  Type of Estate agent of [place];Living will  Healthcare Power of [employer] Power of [employer] Power of Attorney    Does patient want to make changes to medical advance directive? No - Patient declined  No - Patient declined    Yes (MAU/Ambulatory/Procedural Areas - Information given)  Copy of Healthcare Power of Attorney in Chart? Yes - validated most recent copy scanned in chart (See row information)  No - copy requested      Would patient like information on creating a medical advance directive?  No - Patient declined         Current Medications (verified) Outpatient Encounter Medications as of 05/08/2024  Medication Sig   amLODipine  (NORVASC ) 5 MG tablet Take 1 tablet (5 mg total) by mouth daily. For blood pressure   aspirin  EC 81 MG tablet Take 1 tablet (81 mg total) by mouth daily. Swallow whole.   Continuous Blood Gluc Sensor (DEXCOM G7 SENSOR) MISC Inject 1 Device into the skin See admin instructions. Place  1 new sensor into the skin every 10 days   donepezil  (ARICEPT ) 10 MG tablet Take 1 tablet (10 mg total) by mouth at bedtime.   feeding supplement (ENSURE PLUS HIGH PROTEIN) LIQD Take 237 mLs by mouth 2 (two) times daily between meals.   finasteride  (PROSCAR ) 5 MG tablet Take 1 tablet (5 mg total) by mouth daily.   glucose 4 GM chewable tablet Chew 4 g by mouth as needed for low blood sugar.   GVOKE HYPOPEN  1-PACK 1 MG/0.2ML SOAJ Inject under skin 0.2 mL as  needed for hypoglycemia   insulin  aspart (NOVOLOG  FLEXPEN) 100 UNIT/ML FlexPen INJECT 12-18 UNITS INTO THE SKIN 3 TIMES DAILY BEFORE MEALS PER SLIDING SCALE: 10 UNITS 100-200, 15 UNITS 200-300, 18 UNITS >300 CBG   insulin  degludec (TRESIBA  FLEXTOUCH) 200 UNIT/ML FlexTouch Pen Inject 32 Units into the skin daily.   Insulin  Pen Needle (ADVOCATE INSULIN  PEN NEEDLES) 31G X 5 MM MISC Use 3x a day   ONETOUCH ULTRA test strip USE TO check blood glucose four times daily AS DIRECTED   pantoprazole  (PROTONIX ) 40 MG tablet TAKE 1 TABLET BY MOUTH EVERY DAY   simvastatin  (ZOCOR ) 20 MG tablet TAKE 1 TABLET AT BEDTIME   TYLENOL  325 MG tablet Take 325 mg by mouth in the morning and at bedtime.   metFORMIN  (GLUCOPHAGE -XR) 500 MG 24 hr tablet Take 1 tablet (500 mg total) by mouth daily with breakfast. (Patient not taking: Reported on 05/08/2024)   olmesartan -hydrochlorothiazide  (BENICAR  HCT) 40-25 MG tablet TAKE 1 TABLET BY MOUTH EVERY DAY (Patient not taking: Reported on 05/08/2024)   traMADol  (ULTRAM ) 50 MG tablet Take 50 mg by mouth 2 (two) times daily as needed for moderate pain (pain score 4-6). (Patient not taking: Reported on 05/08/2024)   No facility-administered encounter medications on file as of 05/08/2024.    Allergies (verified) Patient has no known allergies.   History: Past Medical History:  Diagnosis Date   Aortic atherosclerosis ([name]) 02/28/2020   By CT   Barrett's esophagus 12/08/2015   By EGD 2010 [place])    Bladder mass    BPH with obstruction/lower urinary tract symptoms 2014   s/p TURP   Cervical spondylosis    COPD (chronic obstructive pulmonary disease) ([name]) 02/28/2020   By CT: diffuse bronchial wall thickening with mild centrilobular and paraseptal emphysema (01/2020)   Coronary artery calcification seen on CAT scan 02/28/2020   3v CAD - calcified atherosclerotic plaque in the left main, left anterior descending and left circumflex coronary arteries (01/2020)   Diabetes  mellitus type 2 with complications ([name])    completed DMSE   DKA (diabetic ketoacidoses) 01/2010   in coma   ED (erectile dysfunction)    Ex-smoker 01/21/2020   Family history of adverse reaction to anesthesia    son stopped breathing during colonoscopy   GERD (gastroesophageal reflux disease)    Gross hematuria    HTN (hypertension)    Hyperlipidemia    controlled with medicine   Laceration of right hand without foreign body 07/03/2023   MCI (mild cognitive impairment) with memory loss    Osteoarthritis    Rheumatic fever    maybe as child   Past Surgical History:  Procedure Laterality Date   ABI  12/2013   WNL   APPENDECTOMY     CATARACT EXTRACTION W/PHACO Right 12/01/2020   Procedure: CATARACT EXTRACTION PHACO AND INTRAOCULAR LENS PLACEMENT (IOC)  RIGHT VIVITY TORIC LENS DIABETIC;  Surgeon: Mittie Gaskin, MD;  Location: [facility];  Service: Ophthalmology;  Laterality: Right;  6.93 1:12.8 9.5%   CATARACT EXTRACTION W/PHACO Left 12/15/2020   Procedure: CATARACT EXTRACTION PHACO AND INTRAOCULAR LENS PLACEMENT (IOC)  VIVITY TORIC LENS DIABETIC LEFT EYE;  Surgeon: Mittie Gaskin, MD;  Location: [facility];  Service: Ophthalmology;  Laterality: Left;  3.47 1:03.8 5.5%   CHOLECYSTECTOMY     years ago   COLONOSCOPY  01/2009   TA, rec rpt 5 yrs [facility])   COLONOSCOPY  01/2018   TAx2, diverticulosis, int hemorrhoids, rpt 5 yrs ([name])   CYSTOSCOPY WITH BIOPSY N/A 02/05/2024   Procedure: CYSTOSCOPY, WITH BIOPSY;  Surgeon: Twylla Glendia BROCKS, MD;  Location: [facility];  Service: Urology;  Laterality: N/A;   CYSTOSCOPY WITH FULGERATION N/A 02/05/2024   Procedure: CYSTOSCOPY, WITH BLADDER FULGURATION;  Surgeon: Twylla Glendia BROCKS, MD;  Location: [facility];  Service: Urology;  Laterality: N/A;   ESOPHAGOGASTRODUODENOSCOPY  01/2009   biopsy with Barrett's   ESOPHAGOGASTRODUODENOSCOPY  01/2018   WNL - no barrett's ([name])   INGUINAL HERNIA REPAIR  Bilateral 03/04/2020   Procedure: LAPAROSCOPIC BILATERAL INGUINAL HERNIA REPAIR WITH MESH;  Surgeon: Vernetta Berg, MD;  Location: [facility];  Service: General;  Laterality: Bilateral;   LAPAROSCOPIC APPENDECTOMY  07/05/2011   Procedure: APPENDECTOMY LAPAROSCOPIC;  Surgeon: Jina Nephew, MD;  Location: [facility];  Service: General;  Laterality: N/A;   MENISCUS REPAIR Right 2018   Norleen Gavel @ [facility]   TRANSURETHRAL RESECTION OF BLADDER TUMOR N/A 02/05/2024   Procedure: TURBT (TRANSURETHRAL RESECTION OF BLADDER TUMOR);  Surgeon: Twylla Glendia BROCKS, MD;  Location: [facility];  Service: Urology;  Laterality: N/A;   TRANSURETHRAL RESECTION OF PROSTATE N/A 07/28/2013   Procedure: TRANSURETHRAL RESECTION OF THE PROSTATE WITH GYRUS INSTRUMENTS;  Surgeon: Mark C Ottelin, MD   VASECTOMY     WISDOM TOOTH EXTRACTION     Family History  Problem Relation Age of Onset   Lymphoma Mother        Non-hodgkins   Coronary artery disease Mother    Heart disease Mother    Cancer Father    Alcohol abuse Father    Diabetes Brother    Pancreatic cancer Maternal Uncle    Diabetes Brother    Diabetes Brother    Diabetes Other    Colon cancer Neg Hx    Lung cancer Neg Hx    Esophageal cancer Neg Hx    Rectal cancer Neg Hx    Stomach cancer Neg Hx    Social History   Socioeconomic History   Marital status: Married    Spouse name: Not on file   Number of children: Not on file   Years of education: Not on file   Highest education level: Not on file  Occupational History   Not on file  Tobacco Use   Smoking status: Former    Current packs/day: 0.00    Average packs/day: 0.3 packs/day for 50.0 years (15.0 ttl pk-yrs)    Types: Cigarettes    Start date: 08/28/1961    Quit date: 08/29/2011    Years since quitting: 12.7   Smokeless tobacco: Never  Vaping Use   Vaping status: Former  Substance and Sexual Activity   Alcohol use: Yes    Alcohol/week: 7.0 standard drinks of alcohol    Types: 7  Standard drinks or equivalent per week    Comment: occ   Drug use: No   Sexual activity: Not Currently  Other Topics Concern   Not on file  Social History Narrative  MD ROSTER:   [employer]   GS - Young      Daily Caffeine Use:  2   Owner/operater of dental lab-sold, but continues to work. Fully retired.   Married 1969   2 daughters 45, 31; 1 son 32   7 grandchildren   Edu: 10th Grade   Hobby: car restoration: has '66 Vette, two classic Chevelle SS's and is restoring a '37 chevy coup   Regular Exercise -  NO   Diet: good water , fruits/vegetables daily   Social Drivers of Corporate investment banker Strain: Low Risk  (05/08/2024)   Overall Financial Resource Strain (CARDIA)    Difficulty of Paying Living Expenses: Not hard at all  Food Insecurity: No Food Insecurity (05/08/2024)   Hunger Vital Sign    Worried About Running Out of Food in the Last Year: Never true    Ran Out of Food in the Last Year: Never true  Transportation Needs: No Transportation Needs (05/08/2024)   PRAPARE - Administrator, Civil Service (Medical): No    Lack of Transportation (Non-Medical): No  Physical Activity: Insufficiently Active (05/08/2024)   Exercise Vital Sign    Days of Exercise per Week: 2 days    Minutes of Exercise per Session: 30 min  Stress: No Stress Concern Present (05/08/2024)   [employer] of Occupational Health - Occupational Stress Questionnaire    Feeling of Stress: Not at all  Social Connections: Socially Integrated (05/08/2024)   Social Connection and Isolation Panel    Frequency of Communication with Friends and Family: More than three times a week    Frequency of Social Gatherings with Friends and Family: More than three times a week    Attends Religious Services: 1 to 4 times per year    Active Member of [employer] or Organizations: Yes    Attends Banker Meetings: Never    Marital Status: Married    Tobacco Counseling Counseling given:  Not Answered    Clinical Intake:  Pre-visit preparation completed: Yes  Pain : 0-10 Pain Score: 6  Pain Location: Knee Pain Orientation: Right, Left Pain Descriptors / Indicators: Aching, Constant     BMI - recorded: 26.99 Nutritional Risks: None Diabetes: Yes CBG done?: No Did pt. bring in CBG monitor from home?: No  Lab Results  Component Value Date   HGBA1C 8.5 (H) 03/18/2024   HGBA1C 7.9 (A) 02/08/2024   HGBA1C 7.9 (A) 11/07/2023     How often do you need to have someone help you when you read instructions, pamphlets, or other written materials from your doctor or pharmacy?: 1 - Never  Interpreter Needed?: No  Information entered by :: Evelia Waskey,CMA   Activities of Daily Living     05/08/2024    3:53 PM 03/18/2024   12:25 AM  In your present state of health, do you have any difficulty performing the following activities:  Hearing? 0 0  Vision? 0 0  Difficulty concentrating or making decisions? 0 0  Walking or climbing stairs? 0   Dressing or bathing? 0   Doing errands, shopping? 0 0  Preparing Food and eating ? N   Using the Toilet? N   In the past six months, have you accidently leaked urine? N   Do you have problems with loss of bowel control? N   Managing your Medications? N   Managing your Finances? N   Housekeeping or managing your Housekeeping? N     Patient  Care Team: [name], MD as PCP - General (Family Medicine) Santo Stanly LABOR, MD as PCP - Cardiology (Cardiology) Gaither Anes, MD (Neurosurgery) Aron Shoulders, MD as Consulting Physician (General Surgery) Ottelin, Mark, MD (Inactive) as Consulting Physician (Urology) Portia Fireman, OD as Consulting Physician (Optometry) Adair, Morna SAILOR, [facility] (Inactive) as Pharmacist (Pharmacist)  I have updated your Care Teams any recent Medical Services you may have received from other providers in the past year.     Assessment:   This is a routine wellness examination for  Anthony [name].  Hearing/Vision screen Hearing Screening - Comments:: No difficulties Vision Screening - Comments:: Patient declined   Goals Addressed             This Visit's Progress    Patient Stated       Knee replacement        Depression Screen     05/08/2024    3:56 PM 03/21/2024    2:46 PM 08/06/2023   11:45 AM 07/06/2023   12:47 PM 07/03/2023    3:16 PM 05/07/2023   12:51 PM 03/06/2023   12:23 PM  PHQ 2/9 Scores  PHQ - 2 Score 0 3  2   0  PHQ- 9 Score 0 9  4     Exception Documentation   Medical reason  Medical reason Patient refusal     Fall Risk     05/08/2024    3:55 PM 03/21/2024    2:45 PM 07/06/2023   12:47 PM 05/07/2023   12:51 PM 01/23/2023   12:33 PM  Fall Risk   Falls in the past year? 1 1 1  0 0  Number falls in past yr: 1 0 1    Injury with Fall? 1 0 1    Risk for fall due to : History of fall(s);Impaired balance/gait;Orthopedic patient History of fall(s)     Follow up Falls evaluation completed;Education provided Follow up appointment       MEDICARE RISK AT HOME:  Medicare Risk at Home Any stairs in or around the home?: Yes If so, are there any without handrails?: No Home free of loose throw rugs in walkways, pet beds, electrical cords, etc?: Yes Adequate lighting in your home to reduce risk of falls?: Yes Life alert?: No Use of a cane, walker or w/c?: Yes Grab bars in the bathroom?: Yes Shower chair or bench in shower?: Yes Elevated toilet seat or a handicapped toilet?: Yes  TIMED UP AND GO:  Was the test performed?  No  Cognitive Function: 6CIT completed    01/12/2020   12:07 PM 01/09/2019    3:30 PM 01/01/2018   11:00 AM 12/29/2016    1:00 PM  MMSE - Mini Mental State Exam  Orientation to time 5 5 5 5    Orientation to Place 5 5 5 5    Registration 3 3 3 3    Attention/ Calculation 5 0 0 0   Recall 3 3 3 3    Language- name 2 objects  0 0 0   Language- repeat 1 1 1 1   Language- follow 3 step command  0 3 3   Language- read & follow direction  0  0 0   Write a sentence  0 0 0   Copy design  0 0 0   Total score  17 20 20       Data saved with a previous flowsheet row definition        05/08/2024    3:52 PM 04/13/2022  2:14 PM  6CIT Screen  What Year? 0 points 0 points  What month? 0 points 0 points  What time? 0 points 0 points  Count back from 20 0 points 2 points  Months in reverse 0 points 4 points  Repeat phrase 0 points 2 points  Total Score 0 points 8 points    Immunizations Immunization History  Administered Date(s) Administered   Fluad Quad(high Dose 65+) 06/21/2020, 09/15/2022   Fluad Trivalent(High Dose 65+) 05/07/2023   INFLUENZA, HIGH DOSE SEASONAL PF 05/18/2017, 06/25/2018, 04/18/2019, 07/04/2021   Influenza Whole 06/02/2008, 06/02/2009, 06/02/2010, 07/08/2011, 07/08/2012   Influenza,inj,Quad PF,6+ Mos 07/05/2015   Influenza-Unspecified 05/28/2013, 05/28/2014, 06/30/2016   PFIZER(Purple Top)SARS-COV-2 Vaccination 10/27/2019, 11/25/2019   Pneumococcal Conjugate-13 06/18/2013   Pneumococcal Polysaccharide-23 07/08/2011, 12/29/2016   Td 07/03/2023   Tetanus 06/18/2013   Zoster Recombinant(Shingrix ) 05/18/2017, 01/14/2018   Zoster, Live 08/26/2014    Screening Tests Health Maintenance  Topic Date Due   Diabetic kidney evaluation - Urine ACR  Never done   COVID-19 Vaccine (3 - Pfizer risk series) 12/23/2019   Colonoscopy  02/21/2023   OPHTHALMOLOGY EXAM  11/21/2023   Influenza Vaccine  03/28/2024   COLON CANCER SCREENING ANNUAL FOBT  05/27/2024   HEMOGLOBIN A1C  09/18/2024   FOOT EXAM  11/11/2024   Diabetic kidney evaluation - eGFR measurement  03/21/2025   Medicare Annual Wellness (AWV)  05/08/2025   DTaP/Tdap/Td (2 - Tdap) 07/02/2033   Pneumococcal Vaccine: 50+ Years  Completed   Hepatitis C Screening  Completed   Zoster Vaccines- Shingrix   Completed   HPV VACCINES  Aged Out   Meningococcal B Vaccine  Aged Out    Health Maintenance Items Addressed:patient declined   Additional  Screening:  Vision Screening: Recommended annual ophthalmology exams for early detection of glaucoma and other disorders of the eye. Is the patient up to date with their annual eye exam?  No  Who is the provider or what is the name of the office in which the patient attends annual eye exams?   Dental Screening: Recommended annual dental exams for proper oral hygiene  Community Resource Referral / Chronic Care Management: CRR required this visit?  No   CCM required this visit?  No   Plan:    I have personally reviewed and noted the following in the patient's chart:   Medical and social history Use of alcohol, tobacco or illicit drugs  Current medications and supplements including opioid prescriptions. Patient is not currently taking opioid prescriptions. Functional ability and status Nutritional status Physical activity Advanced directives List of other physicians Hospitalizations, surgeries, and ER visits in previous 12 months Vitals Screenings to include cognitive, depression, and falls Referrals and appointments  In addition, I have reviewed and discussed with patient certain preventive protocols, quality metrics, and best practice recommendations. A written personalized care plan for preventive services as well as general preventive health recommendations were provided to patient.   Anthony [name] Right, [place]   05/08/2024   After Visit Summary: (MyChart) Due to this being a telephonic visit, the after visit summary with patients personalized plan was offered to patient via MyChart   Notes: Nothing significant to report at this time.

## 2024-05-08 NOTE — Patient Instructions (Signed)
 [name],  Thank you for taking the time for your Medicare Wellness Visit. I appreciate your continued commitment to your health goals. Please review the care plan we discussed, and feel free to reach out if I can assist you further.  Medicare recommends these wellness visits once per year to help you and your care team stay ahead of potential health issues. These visits are designed to focus on prevention, allowing your provider to concentrate on managing your acute and chronic conditions during your regular appointments.  Please note that Annual Wellness Visits do not include a physical exam. Some assessments may be limited, especially if the visit was conducted virtually. If needed, we may recommend a separate in-person follow-up with your provider.  Ongoing Care Seeing your primary care provider every 3 to 6 months helps us  monitor your health and provide consistent, personalized care.   Referrals If a referral was made during today's visit and you haven't received any updates within two weeks, please contact the referred provider directly to check on the status.  Recommended Screenings:  Health Maintenance  Topic Date Due   Yearly kidney health urinalysis for diabetes  Never done   COVID-19 Vaccine (3 - Pfizer risk series) 12/23/2019   Colon Cancer Screening  02/21/2023   Eye exam for diabetics  11/21/2023   Flu Shot  03/28/2024   Stool Blood Test  05/27/2024   Hemoglobin A1C  09/18/2024   Complete foot exam   11/11/2024   Yearly kidney function blood test for diabetes  03/21/2025   Medicare Annual Wellness Visit  05/08/2025   DTaP/Tdap/Td vaccine (2 - Tdap) 07/02/2033   Pneumococcal Vaccine for age over 30  Completed   Hepatitis C Screening  Completed   Zoster (Shingles) Vaccine  Completed   HPV Vaccine  Aged Out   Meningitis B Vaccine  Aged Out       05/08/2024    3:55 PM  Advanced Directives  Does Patient Have a Medical Advance Directive? Yes  Type of Sports coach of [place];Living will  Does patient want to make changes to medical advance directive? No - Patient declined  Copy of Healthcare Power of Attorney in Chart? Yes - validated most recent copy scanned in chart (See row information)   Advance Care Planning is important because it: Ensures you receive medical care that aligns with your values, goals, and preferences. Provides guidance to your family and loved ones, reducing the emotional burden of decision-making during critical moments.  Vision: Annual vision screenings are recommended for early detection of glaucoma, cataracts, and diabetic retinopathy. These exams can also reveal signs of chronic conditions such as diabetes and high blood pressure.  Dental: Annual dental screenings help detect early signs of oral cancer, gum disease, and other conditions linked to overall health, including heart disease and diabetes.  Please see the attached documents for additional preventive care recommendations.

## 2024-05-19 ENCOUNTER — Ambulatory Visit: Admitting: Podiatry

## 2024-05-21 ENCOUNTER — Telehealth: Payer: Self-pay | Admitting: Family Medicine

## 2024-05-21 NOTE — Telephone Encounter (Signed)
 Copied from CRM #[id number]. Topic: General - Other >> May 21, 2024  2:36 PM Paige D wrote: Reason for CRM: Delon bohr with health care services she is calling in regards to her faxing over a CGMS form that needs to be filled out and signed by pcp she is wondering if this has been received. Order is scheduled to ship out to pt  on the 29th. They do need the form before they can ship order.   Call back # [phone] Option 3

## 2024-05-22 NOTE — Telephone Encounter (Signed)
 I see where pharmacy placed order but don't see where we have received form. Is this something that would have came to you?

## 2024-05-22 NOTE — Telephone Encounter (Signed)
 No I have not put order in parachute and have searched him as well. He does not have order on there.  Dr. KANDICE are you aware of this at all? If not can I process request?

## 2024-05-23 ENCOUNTER — Other Ambulatory Visit: Payer: Self-pay | Admitting: Family Medicine

## 2024-05-23 DIAGNOSIS — E11649 Type 2 diabetes mellitus with hypoglycemia without coma: Secondary | ICD-10-CM

## 2024-05-23 NOTE — Telephone Encounter (Signed)
 Called number below. States that they have received form and nothing further is needed from our office.

## 2024-05-23 NOTE — Telephone Encounter (Signed)
 Lab results on 03/01/24 show that you wanted patient to send in fasting readings. Don't see where that was done. If this was a 90 day script that was sent in last time don't think due for refill at this time. I have called patient today about additional phone note and call was answered by daughter. She was not on DPR so could not provider her with any information but she did inform me that patient wife is in hospice care and he will not be able to take call.

## 2024-05-23 NOTE — Progress Notes (Signed)
 Anthony [name]                                          MRN: [id number]   05/23/2024   The [facility] Quality Team Specialist reviewed this patient medical record for the purposes of chart review for care gap closure. The following were reviewed: chart review for care gap closure-kidney health evaluation for diabetes:eGFR  and uACR.    [facility]I Quality Team

## 2024-05-23 NOTE — Telephone Encounter (Signed)
 I don't know anything about this.  Would contact pt to see where he gets his Dexcom G7 from - if this company or from regular pharmacy.  If from this company, would then ask them to refax.

## 2024-05-26 ENCOUNTER — Other Ambulatory Visit: Payer: Self-pay | Admitting: Family Medicine

## 2024-05-26 DIAGNOSIS — Z794 Long term (current) use of insulin: Secondary | ICD-10-CM | POA: Diagnosis not present

## 2024-05-26 DIAGNOSIS — E113299 Type 2 diabetes mellitus with mild nonproliferative diabetic retinopathy without macular edema, unspecified eye: Secondary | ICD-10-CM | POA: Diagnosis not present

## 2024-05-26 DIAGNOSIS — E11649 Type 2 diabetes mellitus with hypoglycemia without coma: Secondary | ICD-10-CM

## 2024-05-26 NOTE — Telephone Encounter (Signed)
 ERx

## 2024-05-28 ENCOUNTER — Telehealth: Payer: Self-pay

## 2024-05-28 ENCOUNTER — Other Ambulatory Visit (HOSPITAL_COMMUNITY): Payer: Self-pay

## 2024-05-28 ENCOUNTER — Other Ambulatory Visit: Payer: Self-pay | Admitting: Family Medicine

## 2024-05-28 NOTE — Telephone Encounter (Signed)
 Pharmacy Patient Advocate Encounter   Received notification from Pt Calls Messages that prior authorization for Tresiba  Flextouch 200 is required/requested.   Insurance verification completed.   The patient is insured through [employer].   Per test claim: The current 93 day co-pay is, $105.00.  No PA needed at this time. This test claim was processed through [facility]- copay amounts may vary at other pharmacies due to pharmacy/plan contracts, or as the patient moves through the different stages of their insurance plan.

## 2024-05-28 NOTE — Telephone Encounter (Addendum)
 Noted

## 2024-05-28 NOTE — Telephone Encounter (Signed)
 Called and advised patient of the copay price for Tresiba . Advised to call back if this is too costly.

## 2024-05-29 ENCOUNTER — Other Ambulatory Visit: Payer: Self-pay | Admitting: Family Medicine

## 2024-05-29 DIAGNOSIS — E11649 Type 2 diabetes mellitus with hypoglycemia without coma: Secondary | ICD-10-CM

## 2024-06-04 ENCOUNTER — Other Ambulatory Visit: Payer: Self-pay | Admitting: Family Medicine

## 2024-06-04 DIAGNOSIS — G3184 Mild cognitive impairment, so stated: Secondary | ICD-10-CM

## 2024-06-04 NOTE — Telephone Encounter (Signed)
 Donepezil  Last filled:  03/10/24, #90 Last OV:  03/21/24, HFU Next OV:  none

## 2024-06-05 ENCOUNTER — Other Ambulatory Visit: Payer: Self-pay | Admitting: Family Medicine

## 2024-06-05 DIAGNOSIS — I10 Essential (primary) hypertension: Secondary | ICD-10-CM

## 2024-06-05 DIAGNOSIS — E1169 Type 2 diabetes mellitus with other specified complication: Secondary | ICD-10-CM

## 2024-06-05 NOTE — Telephone Encounter (Signed)
 Did not see in last note when you wanted patient to follow up? Ok to refill as pended or do I need to let patient know they need to set up follow up?

## 2024-06-05 NOTE — Telephone Encounter (Signed)
 ERx. Plz schedule CPE or follow up visit as due for DM f/u and CPE

## 2024-06-08 ENCOUNTER — Emergency Department (HOSPITAL_BASED_OUTPATIENT_CLINIC_OR_DEPARTMENT_OTHER): Admission: EM | Admit: 2024-06-08 | Discharge: 2024-06-08 | Disposition: A | Discharge status: Home / Self Care

## 2024-06-08 ENCOUNTER — Other Ambulatory Visit: Payer: Self-pay

## 2024-06-08 ENCOUNTER — Emergency Department (HOSPITAL_BASED_OUTPATIENT_CLINIC_OR_DEPARTMENT_OTHER)

## 2024-06-08 ENCOUNTER — Other Ambulatory Visit: Payer: Self-pay | Admitting: Family Medicine

## 2024-06-08 ENCOUNTER — Encounter (HOSPITAL_BASED_OUTPATIENT_CLINIC_OR_DEPARTMENT_OTHER): Payer: Self-pay

## 2024-06-08 DIAGNOSIS — M5416 Radiculopathy, lumbar region: Secondary | ICD-10-CM | POA: Diagnosis not present

## 2024-06-08 DIAGNOSIS — Z7982 Long term (current) use of aspirin: Secondary | ICD-10-CM | POA: Diagnosis not present

## 2024-06-08 DIAGNOSIS — G319 Degenerative disease of nervous system, unspecified: Secondary | ICD-10-CM | POA: Insufficient documentation

## 2024-06-08 DIAGNOSIS — Z794 Long term (current) use of insulin: Secondary | ICD-10-CM | POA: Insufficient documentation

## 2024-06-08 DIAGNOSIS — F0390 Unspecified dementia without behavioral disturbance: Secondary | ICD-10-CM | POA: Insufficient documentation

## 2024-06-08 DIAGNOSIS — M25551 Pain in right hip: Secondary | ICD-10-CM | POA: Insufficient documentation

## 2024-06-08 DIAGNOSIS — M1612 Unilateral primary osteoarthritis, left hip: Secondary | ICD-10-CM | POA: Diagnosis not present

## 2024-06-08 DIAGNOSIS — I6529 Occlusion and stenosis of unspecified carotid artery: Secondary | ICD-10-CM | POA: Diagnosis not present

## 2024-06-08 DIAGNOSIS — E119 Type 2 diabetes mellitus without complications: Secondary | ICD-10-CM | POA: Insufficient documentation

## 2024-06-08 DIAGNOSIS — Z7984 Long term (current) use of oral hypoglycemic drugs: Secondary | ICD-10-CM | POA: Insufficient documentation

## 2024-06-08 DIAGNOSIS — Z79899 Other long term (current) drug therapy: Secondary | ICD-10-CM | POA: Diagnosis not present

## 2024-06-08 DIAGNOSIS — I10 Essential (primary) hypertension: Secondary | ICD-10-CM | POA: Diagnosis not present

## 2024-06-08 DIAGNOSIS — M545 Low back pain, unspecified: Secondary | ICD-10-CM | POA: Diagnosis not present

## 2024-06-08 DIAGNOSIS — M25552 Pain in left hip: Secondary | ICD-10-CM | POA: Diagnosis not present

## 2024-06-08 DIAGNOSIS — K219 Gastro-esophageal reflux disease without esophagitis: Secondary | ICD-10-CM

## 2024-06-08 LAB — COMPREHENSIVE METABOLIC PANEL WITH GFR
ALT: 192 U/L — ABNORMAL HIGH (ref 0–44)
AST: 162 U/L — ABNORMAL HIGH (ref 15–41)
Albumin: 3.8 g/dL (ref 3.5–5.0)
Alkaline Phosphatase: 147 U/L — ABNORMAL HIGH (ref 38–126)
Anion gap: 10 (ref 5–15)
BUN: 24 mg/dL — ABNORMAL HIGH (ref 8–23)
CO2: 25 mmol/L (ref 22–32)
Calcium: 9.2 mg/dL (ref 8.9–10.3)
Chloride: 104 mmol/L (ref 98–111)
Creatinine, Ser: 1.04 mg/dL (ref 0.61–1.24)
GFR, Estimated: >60 mL/min (ref >60)
Glucose, Bld: 285 mg/dL — ABNORMAL HIGH (ref 70–99)
Potassium: 4.2 mmol/L (ref 3.5–5.1)
Sodium: 139 mmol/L (ref 135–145)
Total Bilirubin: 0.9 mg/dL (ref 0.0–1.2)
Total Protein: 6.2 g/dL — ABNORMAL LOW (ref 6.5–8.1)

## 2024-06-08 LAB — CBC WITH DIFFERENTIAL/PLATELET
Abs Immature Granulocytes: 0.03 K/uL (ref 0.00–0.07)
Basophils Absolute: 0 K/uL (ref 0.0–0.1)
Basophils Relative: 0 %
Eosinophils Absolute: 0.1 K/uL (ref 0.0–0.5)
Eosinophils Relative: 1 %
HCT: 40.4 % (ref 39.0–52.0)
Hemoglobin: 13.7 g/dL (ref 13.0–17.0)
Immature Granulocytes: 0 %
Lymphocytes Relative: 13 %
Lymphs Abs: 1.1 K/uL (ref 0.7–4.0)
MCH: 29.5 pg (ref 26.0–34.0)
MCHC: 33.9 g/dL (ref 30.0–36.0)
MCV: 87.1 fL (ref 80.0–100.0)
Monocytes Absolute: 0.7 K/uL (ref 0.1–1.0)
Monocytes Relative: 8 %
Neutro Abs: 6.6 K/uL (ref 1.7–7.7)
Neutrophils Relative %: 78 %
Platelets: 186 K/uL (ref 150–400)
RBC: 4.64 MIL/uL (ref 4.22–5.81)
RDW: 12.9 % (ref 11.5–15.5)
WBC: 8.5 K/uL (ref 4.0–10.5)
nRBC: 0 % (ref 0.0–0.2)

## 2024-06-08 LAB — URINALYSIS, ROUTINE W REFLEX MICROSCOPIC
Bacteria, UA: NONE SEEN
Bilirubin Urine: NEGATIVE
Glucose, UA: >1000 mg/dL — AB
Ketones, ur: NEGATIVE mg/dL
Leukocytes,Ua: NEGATIVE
Nitrite: NEGATIVE
Protein, ur: 30 mg/dL — AB
Specific Gravity, Urine: 1.03 (ref 1.005–1.030)
pH: 5.5 (ref 5.0–8.0)

## 2024-06-08 LAB — LIPASE, BLOOD: Lipase: 13 U/L (ref 11–51)

## 2024-06-08 MED ORDER — MORPHINE SULFATE (PF) 4 MG/ML IV SOLN
4.0000 mg | Freq: Once | INTRAVENOUS | Status: AC
  Administered 2024-06-08: 4 mg via INTRAVENOUS
  Filled 2024-06-08: qty 1

## 2024-06-08 MED ORDER — ONDANSETRON HCL 4 MG/2ML IJ SOLN
4.0000 mg | Freq: Once | INTRAMUSCULAR | Status: AC
  Administered 2024-06-08: 4 mg via INTRAVENOUS
  Filled 2024-06-08: qty 2

## 2024-06-08 MED ORDER — TRAMADOL HCL 50 MG PO TABS: 50.0000 mg | ORAL_TABLET | Freq: Four times a day (QID) | ORAL | 0 refills | Status: AC | PRN

## 2024-06-08 MED ORDER — LIDOCAINE 5 % EX PTCH: 1.0000 | MEDICATED_PATCH | CUTANEOUS | 0 refills | Status: AC

## 2024-06-08 MED ORDER — DICLOFENAC SODIUM 1 % EX GEL: 4.0000 g | Freq: Four times a day (QID) | CUTANEOUS | 0 refills | Status: AC

## 2024-06-08 MED ORDER — IOHEXOL 300 MG/ML  SOLN
100.0000 mL | Freq: Once | INTRAMUSCULAR | Status: AC | PRN
  Administered 2024-06-08: 100 mL via INTRAVENOUS

## 2024-06-08 MED ORDER — KETOROLAC TROMETHAMINE 15 MG/ML IJ SOLN
15.0000 mg | Freq: Once | INTRAMUSCULAR | Status: AC
  Administered 2024-06-08: 15 mg via INTRAVENOUS
  Filled 2024-06-08: qty 1

## 2024-06-08 NOTE — ED Notes (Signed)
 Patient transported to CT

## 2024-06-08 NOTE — ED Triage Notes (Signed)
 Patient present with c/o L hip pain that started over one week ago. Denies injury or trauma. Pulses and sensation intact.

## 2024-06-08 NOTE — ED Provider Notes (Signed)
 Received patient in turnover from Dr. Ula.  Please see their note for further details of Hx, PE.  Briefly patient is a 78 y.o. male with a Hip Pain .  Likely radicular pain by history, CT ap without obvious acute spinal or intraabdominal pathology.  Plan for pain meds, reasssess.  Patient is feeling better would like to try and go home.  Family had a question about patient's LFT elevation.  I discussed the results with them discussed CT imaging and lack of findings in the right upper quadrant.  Has no obvious pain there on exam.  Will have them follow-up with his PCP.    Emil Share, DO 06/08/24 1556

## 2024-06-08 NOTE — ED Provider Notes (Signed)
 [place] EMERGENCY DEPARTMENT AT [facility] Provider Note   CSN: [id number] Arrival date & time: 06/08/24  1134     Patient presents with: Hip Pain   Anthony [name] is a 78 y.o. male.   78 year old male with past medical history of diabetes, hypertension, and hyperlipidemia presenting to the emergency department today complaining of right hip pain.  The patient does have some dementia and his son is unsure if he fell or not.  The patient has been complaining of pain in his lower back as well as abdomen and right hip since yesterday evening.  This was worse this morning and he was having difficulty ambulating so brought him to the ER for further evaluation.  He has been afebrile as far as his son knows.  He denies any known obvious injuries.   Hip Pain       Prior to Admission medications   Medication Sig Start Date End Date Taking? Authorizing Provider  diclofenac  Sodium (VOLTAREN ) 1 % GEL Apply 4 g topically 4 (four) times daily. 06/08/24  Yes Ula Prentice JONELLE, MD  lidocaine  (LIDODERM ) 5 % Place 1 patch onto the skin daily. Remove & Discard patch within 12 hours or as directed by MD 06/08/24  Yes Ula Prentice JONELLE, MD  traMADol  (ULTRAM ) 50 MG tablet Take 1 tablet (50 mg total) by mouth every 6 (six) hours as needed. 06/08/24  Yes Ula Prentice JONELLE, MD  amLODipine  (NORVASC ) 5 MG tablet TAKE 1 TABLET BY MOUTH EVERY DAY FOR BLOOD PRESSURE 05/28/24   [name], MD  aspirin  EC 81 MG tablet Take 1 tablet (81 mg total) by mouth daily. Swallow whole. 10/05/22   Santo Stanly LABOR, MD  Continuous Blood Gluc Sensor (DEXCOM G7 SENSOR) MISC Inject 1 Device into the skin See admin instructions. Place 1 new sensor into the skin every 10 days    [provider]  donepezil  (ARICEPT ) 10 MG tablet TAKE 1 TABLET BY MOUTH EVERYDAY AT BEDTIME 06/05/24   [name], MD  feeding supplement (ENSURE PLUS HIGH PROTEIN) LIQD Take 237 mLs by mouth 2 (two) times daily between meals.  03/20/24 06/18/24  Akula, Vijaya, MD  finasteride  (PROSCAR ) 5 MG tablet Take 1 tablet (5 mg total) by mouth daily. 03/25/24   Vaillancourt, Samantha, PA-C  glucose 4 GM chewable tablet Chew 4 g by mouth as needed for low blood sugar.    [provider]  GVOKE HYPOPEN  1-PACK 1 MG/0.2ML SOAJ Inject under skin 0.2 mL as needed for hypoglycemia 10/24/21   Trixie File, MD  insulin  aspart (NOVOLOG  FLEXPEN) 100 UNIT/ML FlexPen INJECT 12-18 UNITS INTO THE SKIN 3 TIMES DAILY BEFORE MEALS PER SLIDING SCALE: 10 UNITS 100-200, 15 UNITS 200-300, 18 UNITS >300 CBG 02/18/24   [name], MD  insulin  degludec (TRESIBA  FLEXTOUCH) 200 UNIT/ML FlexTouch Pen INJECT 32 UNITS INTO THE SKIN DAILY. 05/26/24   [name], MD  Insulin  Pen Needle (ADVOCATE INSULIN  PEN NEEDLES) 31G X 5 MM MISC Use 3x a day 03/19/17   Trixie File, MD  metFORMIN  (GLUCOPHAGE -XR) 500 MG 24 hr tablet Take 1 tablet (500 mg total) by mouth daily with breakfast. Patient not taking: Reported on 06/17/2024 03/23/24   [name], MD  olmesartan -hydrochlorothiazide  (BENICAR  HCT) 40-25 MG tablet TAKE 1 TABLET BY MOUTH EVERY DAY Patient not taking: Reported on 06/17/2024 06/05/24   [name], MD  [facility] ULTRA test strip USE TO check blood glucose four times daily AS DIRECTED 12/20/22   Trixie File, MD  pantoprazole  (PROTONIX ) 40 MG tablet TAKE 1 TABLET BY MOUTH EVERY DAY 06/10/24   [name], MD  simvastatin  (ZOCOR ) 20 MG tablet TAKE 1 TABLET BY MOUTH EVERYDAY AT BEDTIME 06/05/24   [name], MD  TYLENOL  325 MG tablet Take 325 mg by mouth in the morning and at bedtime.    [provider]    Allergies: Patient has no known allergies.    Review of Systems  Reason unable to perform ROS: Confusion.    Updated Vital Signs BP (!) 170/85 (BP Location: Left Arm)   Pulse 73   Temp 98.4 F (36.9 C) (Oral)   Resp 16   Ht 6' (1.829 m)   Wt 99.8 kg   SpO2 95%   BMI 29.84 kg/m    Physical Exam Vitals and nursing note reviewed.   Gen: NAD, chronically ill-appearing Eyes: PERRL, EOMI HEENT: no oropharyngeal swelling Neck: trachea midline, no midline tenderness Resp: clear to auscultation bilaterally Card: RRR, no murmurs, rubs, or gallops Abd: Diffusely tender without guarding or rebound, right lumbar paraspinal tenderness noted Extremities: The patient is tender over the right proximal femur with no gross deformity noted Neuro: No focal deficits, cranial nerves intact Vascular: 2+ radial pulses bilaterally, 2+ DP pulses bilaterally Skin: no rashes Psyc: acting appropriately   (all labs ordered are listed, but only abnormal results are displayed) Labs Reviewed  COMPREHENSIVE METABOLIC PANEL WITH GFR - Abnormal; Notable for the following components:      Result Value   Glucose, Bld 285 (*)    BUN 24 (*)    Total Protein 6.2 (*)    AST 162 (*)    ALT 192 (*)    Alkaline Phosphatase 147 (*)    All other components within normal limits  URINALYSIS, ROUTINE W REFLEX MICROSCOPIC - Abnormal; Notable for the following components:   Glucose, UA >1,000 (*)    Hgb urine dipstick SMALL (*)    Protein, ur 30 (*)    All other components within normal limits  CBC WITH DIFFERENTIAL/PLATELET  LIPASE, BLOOD    EKG: None  Radiology: No results found.   Procedures   Medications Ordered in the ED  morphine  (PF) 4 MG/ML injection 4 mg (4 mg Intravenous Given 06/08/24 1247)  ondansetron  (ZOFRAN ) injection 4 mg (4 mg Intravenous Given 06/08/24 1247)  iohexol  (OMNIPAQUE ) 300 MG/ML solution 100 mL (100 mLs Intravenous Contrast Given 06/08/24 1348)  ketorolac  (TORADOL ) 15 MG/ML injection 15 mg (15 mg Intravenous Given 06/08/24 1502)                                    Medical Decision Making 78 year old male with past medical history of dementia, diabetes, hypertension, and hyperlipidemia presenting to the emergency department today with abdominal and hip pain  with unknown fall.  I will further evaluate the patient here with CT scan of his head and abdomen for further evaluation for intra-abdominal processes or possible acute traumatic injuries.  Also obtain x-ray of his hip.  Will give patient morphine  Zofran  for symptoms and obtain a urinalysis in addition to basic labs for further evaluation for electrolyte abnormalities or anemia.  Will reevaluate for ultimate disposition.  The patient work appears reassuring.  He is discharged with return precautions.  Amount and/or Complexity of Data Reviewed Labs: ordered. Radiology: ordered.  Risk Prescription drug management.        Final diagnoses:  Lumbar radiculopathy  ED Discharge Orders          Ordered    diclofenac  Sodium (VOLTAREN ) 1 % GEL  4 times daily        06/08/24 1453    lidocaine  (LIDODERM ) 5 %  Every 24 hours        06/08/24 1453    traMADol  (ULTRAM ) 50 MG tablet  Every 6 hours PRN        06/08/24 1453               Ula Prentice SAUNDERS, MD 06/17/24 [phone]

## 2024-06-08 NOTE — ED Notes (Signed)
 Pt pulse ox dropped to 89% after Morphine  administration. MD notified. Pt placed on 2L via [place].

## 2024-06-08 NOTE — Discharge Instructions (Addendum)
 Please use the Voltaren  gel and lidocaine  patches.  You may also give 1000 mg of Tylenol  every 8 hours for pain.  If he is still having pain use the tramadol .  Please follow-up with your doctor and return to the ER for worsening symptoms.

## 2024-06-10 NOTE — Telephone Encounter (Signed)
 E-scribed refills.  Pls schedule annual exam and fasting labs for additional refills.

## 2024-06-13 ENCOUNTER — Ambulatory Visit: Payer: Self-pay

## 2024-06-13 NOTE — Telephone Encounter (Signed)
 Son's contact number is [phone]

## 2024-06-13 NOTE — Telephone Encounter (Signed)
 FYI Only or Action Required?: FYI only for provider.  Patient was last seen in primary care on 03/21/2024 by [name], MD.  Called Nurse Triage reporting Complete Physical Exam, Discussion about Cognitive Function.  Symptoms began weeks to months ago.  Interventions attempted: OTC medications: Tylenol , Prescription medications: usual medications as prescribed, and Rest, hydration, or home remedies.  Symptoms are: son states no major changes.  Triage Disposition: Call PCP Now  Patient/caregiver understands and will follow disposition?: Yes                Copied from CRM #[id number]. Topic: Clinical - Red Word Triage >> Jun 13, 2024  2:46 PM Deaijah H wrote: Red Word that prompted transfer to Nurse Triage: Confusion/Dementia gotten worse within the last month or 2. Needs to be scheduled for CPE. Reason for Disposition  [1] Follow-up call from patient regarding patient's clinical status AND [2] information urgent  Answer Assessment - Initial Assessment Questions No falls, no injuries, not hitting his head Son advised that while patient's wife was in the hospital--they noticed a decline in cognitive of the patient --blood sugars were higher ---not eating well ---not taking medications   Patient's wife has been home since the past three weeks. Son wants an office visit Son wants a neurology referral Patient's son thinks that the patient's  They have not been letting the patient drive lately.  Patient was in the hospital 06/08/2024 with lower back pain---sciatic nerve issues at that time--this has slightly improved since then---Tylenol  a few times a day  Blood sugars are between 100-200 Son denies facial droop or slurring of words  Patient is advised to call us  back if anything changes or with any further questions/concerns. Patient is advised that if anything worsens to go to the Emergency Room. Patient verbalized understanding.        1. SYMPTOM: What  is the main symptom you are concerned about? (e.g., weakness, numbness)     Cognitive decline 2. ONSET: When did this start? (e.g., minutes, hours, days; while sleeping)     The past few months 3. LAST NORMAL: When was the last time you (the patient) were normal (no symptoms)?     Months to a year ago 4. PATTERN Does this come and go, or has it been constant since it started?  Is it present now?     Progressively worse over the past few months 5. CARDIAC SYMPTOMS: Have you had any of the following symptoms: chest pain, difficulty breathing, palpitations?     Son denies 6. NEUROLOGIC SYMPTOMS: Have you had any of the following symptoms: headache, dizziness, vision loss, double vision, changes in speech, unsteady on your feet?     ------ 7. OTHER SYMPTOMS: Do you have any other symptoms?     Some lower back pain  Answer Assessment - Initial Assessment Questions Son's contact number is [phone]  Son advised that while patient's wife was in the hospital--they noticed a decline in cognitive of the patient --blood sugars were higher ---not eating well ---not taking medications   Patient's wife has been home since the past three weeks. Son wants an office visit Son wants a neurology referral if possible-- Patient's son thinks that the patient's cognitive state has not had any major changes They have not been letting the patient drive lately.  Son is staying with the patient now 24/7 He also thinks the patient might have sleep apnea---he states the way he is breathing at night during sleep makes him think  he has this.  No falls, no injuries, not hitting his head Son advised that while patient's wife was in the hospital--they noticed a decline in cognitive of the patient --blood sugars were higher ---not eating well ---not taking medications   Patient's wife has been home since the past three weeks. Son wants an office visit Son wants a neurology referral Patient's  son thinks that the patient's  They have not been letting the patient drive lately.  Patient was in the hospital 06/08/2024 with lower back pain---sciatic nerve issues at that time--this has slightly improved since then---Tylenol  a few times a day  Blood sugars are between 100-200 Son denies facial droop or slurring of words, major recent changes   Son's contact number is [phone]  Patient is advised to call us  back if anything changes or with any further questions/concerns. Patient is advised that if anything worsens to go to the Emergency Room. Patient verbalized understanding.  Protocols used: Neurologic Deficit-A-AH, PCP Call - No Triage-A-AH

## 2024-06-16 NOTE — Telephone Encounter (Signed)
 Patient needs appt - we've been trying to get him scheduled for the past 2+ weeks.  He called back asking to schedule appt and it was sent back to me. Please schedule appt with me - please offer tomorrow 1pm.

## 2024-06-17 ENCOUNTER — Encounter: Payer: Self-pay | Admitting: Family Medicine

## 2024-06-17 ENCOUNTER — Ambulatory Visit (INDEPENDENT_AMBULATORY_CARE_PROVIDER_SITE_OTHER): Admitting: Family Medicine

## 2024-06-17 ENCOUNTER — Telehealth: Payer: Self-pay

## 2024-06-17 VITALS — BP 140/78 | HR 76 | Temp 98.6°F | Ht 72.0 in | Wt 203.0 lb

## 2024-06-17 DIAGNOSIS — E11649 Type 2 diabetes mellitus with hypoglycemia without coma: Secondary | ICD-10-CM

## 2024-06-17 DIAGNOSIS — Z794 Long term (current) use of insulin: Secondary | ICD-10-CM

## 2024-06-17 DIAGNOSIS — R7401 Elevation of levels of liver transaminase levels: Secondary | ICD-10-CM | POA: Diagnosis not present

## 2024-06-17 DIAGNOSIS — E113299 Type 2 diabetes mellitus with mild nonproliferative diabetic retinopathy without macular edema, unspecified eye: Secondary | ICD-10-CM

## 2024-06-17 DIAGNOSIS — Z23 Encounter for immunization: Secondary | ICD-10-CM | POA: Diagnosis not present

## 2024-06-17 DIAGNOSIS — G3184 Mild cognitive impairment, so stated: Secondary | ICD-10-CM

## 2024-06-17 DIAGNOSIS — R400 Somnolence: Secondary | ICD-10-CM

## 2024-06-17 DIAGNOSIS — I10 Essential (primary) hypertension: Secondary | ICD-10-CM

## 2024-06-17 LAB — COMPREHENSIVE METABOLIC PANEL WITH GFR
ALT: 20 U/L (ref 0–53)
AST: 10 U/L (ref 0–37)
Albumin: 3.9 g/dL (ref 3.5–5.2)
Alkaline Phosphatase: 83 U/L (ref 39–117)
BUN: 25 mg/dL — ABNORMAL HIGH (ref 6–23)
CO2: 26 meq/L (ref 19–32)
Calcium: 8.8 mg/dL (ref 8.4–10.5)
Chloride: 108 meq/L (ref 96–112)
Creatinine, Ser: 1.02 mg/dL (ref 0.40–1.50)
GFR: 71.64 mL/min (ref >60.00)
Glucose, Bld: 142 mg/dL — ABNORMAL HIGH (ref 70–99)
Potassium: 3.8 meq/L (ref 3.5–5.1)
Sodium: 139 meq/L (ref 135–145)
Total Bilirubin: 0.4 mg/dL (ref 0.2–1.2)
Total Protein: 6.2 g/dL (ref 6.0–8.3)

## 2024-06-17 LAB — TSH: TSH: 1.74 u[IU]/mL (ref 0.35–5.50)

## 2024-06-17 NOTE — Telephone Encounter (Signed)
 Copied from CRM #[id number]. Topic: Medical Record Request - Other >> Jun 16, 2024  2:51 PM Ashley R wrote:  Reason for CRM: Confirm diagnosis, any missed appts. [facility]. [facility]  Also asking if his wife has been seen here.  Callback [phone]   Called number above left message for Nat to call back. Need to talk to her when she calls back.

## 2024-06-17 NOTE — Patient Instructions (Addendum)
 Flu shot today  Ok to stay off metformin .  Ok to stay off olmesartan  /hydrochlorothiazide .   The 15-15 rule for low sugars: If sugar reading below 70, have 15 grams of carbohydrate to raise your blood sugar and check it after 15 minutes. If it's still below 70 mg/dL, have another serving. 15 grams of carbs may be: -Glucose tablets (see instructions) -Gel tube (see instructions) -4 ounces (1/2 cup) of juice or regular soda (not diet) -1 tablespoon of sugar, honey, or corn syrup -Hard candies, jellybeans or gumdrops--see food label for how many to consume  Repeat these steps until your blood sugar is at least 70 mg/dL. Once your blood sugar is back to normal, eat a meal or snack to make sure it doesn't lower again.    I will order home sleep test.  Update labs today  Return in 3-4 weeks for follow up visit.

## 2024-06-17 NOTE — Progress Notes (Unsigned)
 Ph: [phone] Fax: [phone]   Patient ID: Anthony [name], male    DOB: [birth year], 78 y.o.   MRN: [id number]  This visit was conducted in person.  BP (!) 140/78   Pulse 76   Temp 98.6 F (37 C) (Oral)   Ht 6' (1.829 m)   Wt 203 lb (92.1 kg)   SpO2 99%   BMI 27.53 kg/m   BP Readings from Last 3 Encounters:  06/17/24 (!) 140/78  06/08/24 (!) 170/85  05/08/24 (!) 153/76   CC: follow up visit  Subjective:   HPI: Anthony [name] is a 78 y.o. male presenting on 06/17/2024 for Medical Management of Chronic Issues   Last seen 02/2024 after hospitalization for weakness, fatigue, ARF s/p TURBT 01/2024. Cr up to 3.57 at prior hospitalization, improved on discharge.   Seen at ER 06/08/2024 with low back pain - records reviewed:  CT head - advanced for age atrophy and white matter changes, ATH to cavernous carotid and vertebral arteries.  Contrasted CT abd/pelvis - no acute process, diverticulosis without -itis, enlarged prostate gland, CAC and aortic ATH.  Other labs ok including lipase.  Dx sciatic nerve exacerbation, treated with pain med to include morphine  4mg  and toradol  15mg  x1.   Marked transaminitis noted on ER labs. They deny new medicines, vitamins, supplements.  Was taking tylenol  1000mg  tid - this was started after latest ER visit (and transaminitis) No recent tylenol  use over the past few days. L hip/ L sciatica pain seems to be settling down.   Metformin  and olmesartan /hydrochlorothiazide  remain on hold from prior hospitalization.  Finasteride  restarted.  Continues amlodipine   DM - previous regimen was Tresiba  32u daily, novolog  flexpen 12-18u TID AC with SSI (10u 100-200, 15u 200-300, 18u >300).  Currently taking tresiba  30-32u daily, with novolog  flexpen (more with breakfast, then smaller amount with lunch and smaller with dinner). Breakfast is 9:30am, lunch 1pm, dinner 5pm. Son notes ongoing am hypoglycemia - treated with glucose tablets. Has glucagon   pens available but hasn't needed to use recently.  CGM data: Dexcom G6 14 day average sugar: 211, time in range 40%, hypoglycemia <1%, stage 1 hyperglycemia 28%, stage 2 hyperglycemia 31%  Lab Results  Component Value Date   HGBA1C 8.5 (H) 03/18/2024    Family had noted memory and medical deterioration during wife's acute illness. Noted increased confusion, correlating with worse sugar control.  Son is now staying with them regularly.  These symptoms may be some better with better sugar control.   Son also notes loud snoring, witnessed apnea, daytime somnolence, non-restorative sleep. Requests further evaluation for OSA.   Wife recently hospitalized as well with prolonged hospitalization for critical illness - staph bacteremia, small bowel ischemia s/p exploratory laparotomy with small bowel resection   She has been back home for about 4 weeks and is recovering well.  Hospice now involved. Sitter available for mom.      Relevant past medical, surgical, family and social history reviewed and updated as indicated. Interim medical history since our last visit reviewed. Allergies and medications reviewed and updated. Outpatient Medications Prior to Visit  Medication Sig Dispense Refill   amLODipine  (NORVASC ) 5 MG tablet TAKE 1 TABLET BY MOUTH EVERY DAY FOR BLOOD PRESSURE 90 tablet 2   aspirin  EC 81 MG tablet Take 1 tablet (81 mg total) by mouth daily. Swallow whole. 30 tablet 12   Continuous Blood Gluc Sensor (DEXCOM G7 SENSOR) MISC Inject 1 Device into the skin See admin instructions. Place  1 new sensor into the skin every 10 days     diclofenac  Sodium (VOLTAREN ) 1 % GEL Apply 4 g topically 4 (four) times daily. 100 g 0   donepezil  (ARICEPT ) 10 MG tablet TAKE 1 TABLET BY MOUTH EVERYDAY AT BEDTIME 90 tablet 1   finasteride  (PROSCAR ) 5 MG tablet Take 1 tablet (5 mg total) by mouth daily. 90 tablet 3   glucose 4 GM chewable tablet Chew 4 g by mouth as needed for low blood sugar.     GVOKE  HYPOPEN 1-PACK 1 MG/0.2ML SOAJ Inject under skin 0.2 mL as needed for hypoglycemia 0.2 mL 11   insulin  aspart (NOVOLOG  FLEXPEN) 100 UNIT/ML FlexPen INJECT 12-18 UNITS INTO THE SKIN 3 TIMES DAILY BEFORE MEALS PER SLIDING SCALE: 10 UNITS 100-200, 15 UNITS 200-300, 18 UNITS >300 CBG 30 mL 3   insulin  degludec (TRESIBA  FLEXTOUCH) 200 UNIT/ML FlexTouch Pen INJECT 32 UNITS INTO THE SKIN DAILY. 15 mL 0   Insulin  Pen Needle (ADVOCATE INSULIN  PEN NEEDLES) 31G X 5 MM MISC Use 3x a day 300 each 3   lidocaine  (LIDODERM ) 5 % Place 1 patch onto the skin daily. Remove & Discard patch within 12 hours or as directed by MD 30 patch 0   ONETOUCH ULTRA test strip USE TO check blood glucose four times daily AS DIRECTED 150 strip 3   pantoprazole  (PROTONIX ) 40 MG tablet TAKE 1 TABLET BY MOUTH EVERY DAY 90 tablet 0   simvastatin  (ZOCOR ) 20 MG tablet TAKE 1 TABLET BY MOUTH EVERYDAY AT BEDTIME 90 tablet 0   traMADol  (ULTRAM ) 50 MG tablet Take 1 tablet (50 mg total) by mouth every 6 (six) hours as needed. 15 tablet 0   TYLENOL  325 MG tablet Take 325 mg by mouth in the morning and at bedtime.     feeding supplement (ENSURE PLUS HIGH PROTEIN) LIQD Take 237 mLs by mouth 2 (two) times daily between meals. 14220 mL 2   metFORMIN  (GLUCOPHAGE -XR) 500 MG 24 hr tablet Take 1 tablet (500 mg total) by mouth daily with breakfast. (Patient not taking: Reported on 06/17/2024) 90 tablet 1   olmesartan -hydrochlorothiazide  (BENICAR  HCT) 40-25 MG tablet TAKE 1 TABLET BY MOUTH EVERY DAY (Patient not taking: Reported on 06/17/2024) 90 tablet 0   No facility-administered medications prior to visit.     Per HPI unless specifically indicated in ROS section below Review of Systems  Objective:  BP (!) 140/78   Pulse 76   Temp 98.6 F (37 C) (Oral)   Ht 6' (1.829 m)   Wt 203 lb (92.1 kg)   SpO2 99%   BMI 27.53 kg/m   Wt Readings from Last 3 Encounters:  06/17/24 203 lb (92.1 kg)  06/08/24 220 lb (99.8 kg)  05/08/24 199 lb (90.3 kg)       Physical Exam Vitals and nursing note reviewed.  Constitutional:      Appearance: Normal appearance. He is not ill-appearing.  HENT:     Head: Normocephalic and atraumatic.     Mouth/Throat:     Mouth: Mucous membranes are moist.     Pharynx: Oropharynx is clear. No oropharyngeal exudate or posterior oropharyngeal erythema.  Eyes:     Extraocular Movements: Extraocular movements intact.     Pupils: Pupils are equal, round, and reactive to light.  Neck:     Comments:  Neck circ 47cm Cardiovascular:     Rate and Rhythm: Normal rate and regular rhythm.     Pulses: Normal pulses.  Heart sounds: Normal heart sounds. No murmur heard. Pulmonary:     Effort: Pulmonary effort is normal. No respiratory distress.     Breath sounds: Normal breath sounds. No wheezing, rhonchi or rales.  Musculoskeletal:     Right lower leg: No edema.     Left lower leg: No edema.  Skin:    General: Skin is warm and dry.     Findings: No rash.       Results for orders placed or performed in visit on 06/17/24  Comprehensive metabolic panel with GFR   Collection Time: 06/17/24  2:03 PM  Result Value Ref Range   Sodium 139 135 - 145 mEq/L   Potassium 3.8 3.5 - 5.1 mEq/L   Chloride 108 96 - 112 mEq/L   CO2 26 19 - 32 mEq/L   Glucose, Bld 142 (H) 70 - 99 mg/dL   BUN 25 (H) 6 - 23 mg/dL   Creatinine, Ser 8.97 0.40 - 1.50 mg/dL   Total Bilirubin 0.4 0.2 - 1.2 mg/dL   Alkaline Phosphatase 83 39 - 117 U/L   AST 10 0 - 37 U/L   ALT 20 0 - 53 U/L   Total Protein 6.2 6.0 - 8.3 g/dL   Albumin 3.9 3.5 - 5.2 g/dL   GFR 28.35 >39.99 mL/min   Calcium 8.8 8.4 - 10.5 mg/dL  TSH   Collection Time: 06/17/24  2:03 PM  Result Value Ref Range   TSH 1.74 0.35 - 5.50 uIU/mL    Assessment & Plan:   Problem List Items Addressed This Visit     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       Hypertension   Chronic, BP mildly elevated off  olmesartan  /hydrochlorothiazide  - will remain off in setting of recent AKI.  Continue amlodipine  5mg  daily      MCI (mild cognitive impairment) with memory loss   Chronic, managed with aricept  10mg  nightly.  Possible worsening in setting of wife's illness and deteriorated glycemic control Consider updated MMSE at next visit.  Do recommend continued heavy involvement by children in all aspects of care for pt and wife.  Consider adding namenda      Type 2 diabetes mellitus with hypoglycemia ([name]) - Primary   Chronic brittle diabetes.  Overall improvement in glycemic control when children are more actively involved in insulin  management.  Rare lows <70. 15-15 hypoglycemia rule provided.  Continue tresiba  30-32 units nightly - could consider shorter acting basal insulin .  Reviewed novolog  use:  - 12-18 min onset of action - recommend taking 15 min prior to meals.  - peak effect 1-3 hours - duration of action 3-7 hours  As they note early mornings lows, discussed trying to space meals at least 4 hours apart, if lunch is sooner, consider small insulin  mealtime correction dose at lunch. Largest meal is dinner. Consider bedtime snack to prevent overnight lows.  RTC 3 mo DM f/u visit.       Daytime somnolence   Son notes loud snoring, witnesed apnea, daytime somnolence, non-restorative sleep. Will order HST for further eval of OSA.       Other Visit Diagnoses       Encounter for immunization       Relevant Orders   Flu vaccine HIGH DOSE PF(Fluzone Trivalent) (Completed)     Transaminitis       Relevant Orders   Comprehensive metabolic panel with GFR (Completed)   TSH (  Completed)        No orders of the defined types were placed in this encounter.   Orders Placed This Encounter  Procedures   Flu vaccine HIGH DOSE PF(Fluzone Trivalent)   Comprehensive metabolic panel with GFR   TSH    Patient Instructions  Flu shot today  Ok to stay off metformin .  Ok to stay off  olmesartan  /hydrochlorothiazide .   The 15-15 rule for low sugars: If sugar reading below 70, have 15 grams of carbohydrate to raise your blood sugar and check it after 15 minutes. If it's still below 70 mg/dL, have another serving. 15 grams of carbs may be: -Glucose tablets (see instructions) -Gel tube (see instructions) -4 ounces (1/2 cup) of juice or regular soda (not diet) -1 tablespoon of sugar, honey, or corn syrup -Hard candies, jellybeans or gumdrops--see food label for how many to consume  Repeat these steps until your blood sugar is at least 70 mg/dL. Once your blood sugar is back to normal, eat a meal or snack to make sure it doesn't lower again.    I will order home sleep test.  Update labs today  Return in 3-4 weeks for follow up visit.   Follow up plan: Return in about 1 month (around 07/18/2024) for follow up visit.  Anton Blas, MD

## 2024-06-17 NOTE — Telephone Encounter (Signed)
 Called son set up appointment for today.

## 2024-06-19 ENCOUNTER — Ambulatory Visit: Payer: Self-pay | Admitting: Family Medicine

## 2024-06-19 DIAGNOSIS — G4739 Other sleep apnea: Secondary | ICD-10-CM | POA: Insufficient documentation

## 2024-06-19 DIAGNOSIS — R400 Somnolence: Secondary | ICD-10-CM | POA: Insufficient documentation

## 2024-06-19 NOTE — Assessment & Plan Note (Addendum)
 Chronic brittle diabetes.  Overall improvement in glycemic control when children are more actively involved in insulin  management.  Rare lows <70. 15-15 hypoglycemia rule provided.  Continue tresiba  30-32 units nightly - could consider shorter acting basal insulin .  Reviewed novolog  use:  - 12-18 min onset of action - recommend taking 15 min prior to meals.  - peak effect 1-3 hours - duration of action 3-7 hours  As they note early mornings lows, discussed trying to space meals at least 4 hours apart, if lunch is sooner, consider small insulin  mealtime correction dose at lunch. Largest meal is dinner. Consider bedtime snack to prevent overnight lows.  RTC 3 mo DM f/u visit.

## 2024-06-19 NOTE — Assessment & Plan Note (Signed)
 Son notes loud snoring, witnesed apnea, daytime somnolence, non-restorative sleep. Will order HST for further eval of OSA.

## 2024-06-19 NOTE — Assessment & Plan Note (Addendum)
 Chronic, BP mildly elevated off olmesartan  /hydrochlorothiazide  - will remain off in setting of recent AKI.  Continue amlodipine  5mg  daily

## 2024-06-19 NOTE — Assessment & Plan Note (Addendum)
 Chronic, managed with aricept  10mg  nightly.  Possible worsening in setting of wife's illness and deteriorated glycemic control Consider updated MMSE at next visit.  Do recommend continued heavy involvement by children in all aspects of care for pt and wife.  Consider adding namenda

## 2024-06-19 NOTE — Assessment & Plan Note (Signed)
 Overdue for DR screen.

## 2024-06-30 NOTE — Telephone Encounter (Signed)
 Would verify with front office that we can provide this information then respond to [facility] with requested information.

## 2024-06-30 NOTE — Telephone Encounter (Signed)
 I have not received call back regarding this. Patient was seen in office after we got this message. Is this something I need to continue to call about?

## 2024-07-01 ENCOUNTER — Ambulatory Visit: Admitting: Podiatry

## 2024-07-01 ENCOUNTER — Telehealth: Payer: Self-pay

## 2024-07-01 ENCOUNTER — Encounter: Payer: Self-pay | Admitting: Podiatry

## 2024-07-01 DIAGNOSIS — G4730 Sleep apnea, unspecified: Secondary | ICD-10-CM | POA: Diagnosis not present

## 2024-07-01 DIAGNOSIS — M79674 Pain in right toe(s): Secondary | ICD-10-CM | POA: Diagnosis not present

## 2024-07-01 DIAGNOSIS — M79675 Pain in left toe(s): Secondary | ICD-10-CM

## 2024-07-01 DIAGNOSIS — E1142 Type 2 diabetes mellitus with diabetic polyneuropathy: Secondary | ICD-10-CM

## 2024-07-01 DIAGNOSIS — B351 Tinea unguium: Secondary | ICD-10-CM | POA: Diagnosis not present

## 2024-07-01 NOTE — Progress Notes (Signed)
  Subjective:  Patient ID: Anthony [name], male    DOB: [birth year],   MRN: [id number]  Chief Complaint  Patient presents with   Diabetes    Check his feet and clip his nails.  Saw Dr. Anton Blas -06/17/2024;  A1c - 8.5     78 y.o. male presents for concern of thickened elongated and painful nails that are difficult to trim. Requesting to have them trimmed today. Relates burning and tingling in their feet. Patient is diabetic and last A1c was  Lab Results  Component Value Date   HGBA1C 8.5 (H) 03/18/2024   .   PCP:  Blas Anton, MD    . Denies any other pedal complaints. Denies n/v/f/c.   Past Medical History:  Diagnosis Date   Aortic atherosclerosis 02/28/2020   By CT   Barrett's esophagus 12/08/2015   By EGD 2010 [place])    Bladder mass    BPH with obstruction/lower urinary tract symptoms 2014   s/p TURP   Cervical spondylosis    COPD (chronic obstructive pulmonary disease) ([name]) 02/28/2020   By CT: diffuse bronchial wall thickening with mild centrilobular and paraseptal emphysema (01/2020)   Coronary artery calcification seen on CAT scan 02/28/2020   3v CAD - calcified atherosclerotic plaque in the left main, left anterior descending and left circumflex coronary arteries (01/2020)   Diabetes mellitus type 2 with complications ([name])    completed DMSE   DKA (diabetic ketoacidoses) 01/2010   in coma   ED (erectile dysfunction)    Ex-smoker 01/21/2020   Family history of adverse reaction to anesthesia    son stopped breathing during colonoscopy   GERD (gastroesophageal reflux disease)    Gross hematuria    HTN (hypertension)    Hyperlipidemia    controlled with medicine   Laceration of right hand without foreign body 07/03/2023   MCI (mild cognitive impairment) with memory loss    Osteoarthritis    Rheumatic fever    maybe as child    Objective:  Physical Exam: Vascular: DP/PT pulses 2/4 bilateral. CFT <3 seconds. Normal hair growth on digits.  No edema.  Skin. No lacerations or abrasions bilateral feet. Hyperkeratotic lesion noted sub first metatarsal head on right. Nails 1-5 bilateral are thickened dystrophic and with subungudal debris.  Musculoskeletal: MMT 5/5 bilateral lower extremities in DF, PF, Inversion and Eversion. Deceased ROM in DF of ankle joint.  Neurological: Sensation intact to light touch.   Assessment:   1. Pain due to onychomycosis of toenails of both feet   2. Diabetic peripheral neuropathy associated with type 2 diabetes mellitus ([name])      Plan:  Patient was evaluated and treated and all questions answered. -Discussed and educated patient on diabetic foot care, especially with  regards to the vascular, neurological and musculoskeletal systems.  -Stressed the importance of good glycemic control and the detriment of not  controlling glucose levels in relation to the foot. -Discussed supportive shoes at all times and checking feet regularly.  -Mechanically debrided all nails 1-5 bilateral using sterile nail nipper and filed with dremel without incident  -Answered all patient questions -Patient to return  in 3 months for at risk foot care -Patient advised to call the office if any problems or questions arise in the meantime.   Asberry Failing, DPM

## 2024-07-02 DIAGNOSIS — Z789 Other specified health status: Secondary | ICD-10-CM | POA: Diagnosis not present

## 2024-07-02 DIAGNOSIS — L218 Other seborrheic dermatitis: Secondary | ICD-10-CM | POA: Diagnosis not present

## 2024-07-02 DIAGNOSIS — L814 Other melanin hyperpigmentation: Secondary | ICD-10-CM | POA: Diagnosis not present

## 2024-07-02 DIAGNOSIS — L538 Other specified erythematous conditions: Secondary | ICD-10-CM | POA: Diagnosis not present

## 2024-07-02 DIAGNOSIS — L821 Other seborrheic keratosis: Secondary | ICD-10-CM | POA: Diagnosis not present

## 2024-07-02 DIAGNOSIS — L820 Inflamed seborrheic keratosis: Secondary | ICD-10-CM | POA: Diagnosis not present

## 2024-07-02 DIAGNOSIS — R208 Other disturbances of skin sensation: Secondary | ICD-10-CM | POA: Diagnosis not present

## 2024-07-02 DIAGNOSIS — L2989 Other pruritus: Secondary | ICD-10-CM | POA: Diagnosis not present

## 2024-07-02 DIAGNOSIS — D1801 Hemangioma of skin and subcutaneous tissue: Secondary | ICD-10-CM | POA: Diagnosis not present

## 2024-07-10 ENCOUNTER — Encounter: Payer: Self-pay | Admitting: Pharmacist

## 2024-07-10 NOTE — Progress Notes (Signed)
 Pharmacy Quality Measure Review  This patient is appearing on a report for being at risk of failing the Controlling Blood Pressure measure this calendar year.   Last documented BP BP Readings from Last 1 Encounters:  06/17/24 (!) 140/78   Pass: NO, must be <140/80.  2025 f/u: YES (07/15/24) Encounter note placed. BP recheck if 140+/90+  ___________________________________ SUPD: Statin filled 2025 = YES  KED - Kidney Health Evaluation for Patients with Diabetes Lab Results  Component Value Date   HGBA1C 8.5 (H) 03/18/2024   Last GFR 2025 Lab Results  Component Value Date   GFR 71.64 06/17/2024   Last UACR: None  Future msg sent to PCP for UACR consideration.  Encounter note updated, consider UACR

## 2024-07-15 ENCOUNTER — Encounter: Payer: Self-pay | Admitting: Family Medicine

## 2024-07-15 ENCOUNTER — Ambulatory Visit: Payer: Self-pay | Admitting: Family Medicine

## 2024-07-15 ENCOUNTER — Ambulatory Visit (INDEPENDENT_AMBULATORY_CARE_PROVIDER_SITE_OTHER): Admitting: Family Medicine

## 2024-07-15 VITALS — BP 138/72 | HR 70 | Temp 98.6°F | Ht 72.0 in | Wt 207.0 lb

## 2024-07-15 DIAGNOSIS — M170 Bilateral primary osteoarthritis of knee: Secondary | ICD-10-CM | POA: Diagnosis not present

## 2024-07-15 DIAGNOSIS — E113299 Type 2 diabetes mellitus with mild nonproliferative diabetic retinopathy without macular edema, unspecified eye: Secondary | ICD-10-CM | POA: Diagnosis not present

## 2024-07-15 DIAGNOSIS — Z794 Long term (current) use of insulin: Secondary | ICD-10-CM

## 2024-07-15 DIAGNOSIS — R400 Somnolence: Secondary | ICD-10-CM

## 2024-07-15 DIAGNOSIS — E11649 Type 2 diabetes mellitus with hypoglycemia without coma: Secondary | ICD-10-CM

## 2024-07-15 DIAGNOSIS — G3184 Mild cognitive impairment, so stated: Secondary | ICD-10-CM | POA: Diagnosis not present

## 2024-07-15 LAB — POCT GLYCOSYLATED HEMOGLOBIN (HGB A1C): Hemoglobin A1C: 7.5 % — AB (ref 4.0–5.6)

## 2024-07-15 NOTE — Patient Instructions (Addendum)
 Urine test today. Schedule eye exam as you're due Ask to see Dr Watt sports medicine to discuss possible gel shots into knees.  Sugars are doing much better! Keep up the good work!  Return to see me in 3 months

## 2024-07-15 NOTE — Progress Notes (Unsigned)
 Ph: [phone] Fax: [phone]   Patient ID: Anthony [name], male    DOB: 13-Aug-1946, 78 y.o.   MRN: [id number]  This visit was conducted in person.  BP 138/72   Pulse 70   Temp 98.6 F (37 C) (Oral)   Ht 6' (1.829 m)   Wt 207 lb (93.9 kg)   SpO2 96%   BMI 28.07 kg/m    CC: 1 mo f/u visit  Subjective:   HPI: Anthony [name] is a 78 y.o. male presenting on 07/15/2024 for Medical Management of Chronic Issues (Pt here for 3-4 wk f/u sleep study results)   See prior notes for details.   Son noted loud snoring, witnessed apnea, daytime somnolence, non-restorative sleep. Just completed HST - pending results.  Memory is much improved with better sugar control.   DM - does regularly check sugars 100-180. Compliant with antihyperglycemic regimen which includes: tresiba  30-32u daily, novolog  mealtime insulin . Occ low sugars - manages with glucerna protein shake. Denies paresthesias, blurry vision. Last diabetic eye exam due. Glucometer brand: one touch ultra - not using. Last foot exam: 07/01/2024 - with podiatry Dr Joya. DSME: completed remotely. CGM data: Dexcom G6 30 day average sugar: 191, time in range 50%, hypoglycemia 1%, stage 1 hyperglycemia 25%, stage 2 hyperglycemia 24%.  Lab Results  Component Value Date   HGBA1C 7.5 (A) 07/15/2024   Diabetic Foot Exam - Simple   No data filed    No results found for: MACKEY CURRENT        Relevant past medical, surgical, family and social history reviewed and updated as indicated. Interim medical history since our last visit reviewed. Allergies and medications reviewed and updated. Outpatient Medications Prior to Visit  Medication Sig Dispense Refill   clobetasol (TEMOVATE) 0.05 % external solution Apply topically daily as needed.     amLODipine  (NORVASC ) 5 MG tablet TAKE 1 TABLET BY MOUTH EVERY DAY FOR BLOOD PRESSURE 90 tablet 2   aspirin  EC 81 MG tablet Take 1 tablet (81 mg total) by mouth daily.  Swallow whole. 30 tablet 12   Continuous Blood Gluc Sensor (DEXCOM G7 SENSOR) MISC Inject 1 Device into the skin See admin instructions. Place 1 new sensor into the skin every 10 days     diclofenac  Sodium (VOLTAREN ) 1 % GEL Apply 4 g topically 4 (four) times daily. (Patient not taking: Reported on 07/15/2024) 100 g 0   donepezil  (ARICEPT ) 10 MG tablet TAKE 1 TABLET BY MOUTH EVERYDAY AT BEDTIME 90 tablet 1   finasteride  (PROSCAR ) 5 MG tablet Take 1 tablet (5 mg total) by mouth daily. 90 tablet 3   glucose 4 GM chewable tablet Chew 4 g by mouth as needed for low blood sugar.     GVOKE HYPOPEN  1-PACK 1 MG/0.2ML SOAJ Inject under skin 0.2 mL as needed for hypoglycemia 0.2 mL 11   insulin  aspart (NOVOLOG  FLEXPEN) 100 UNIT/ML FlexPen INJECT 12-18 UNITS INTO THE SKIN 3 TIMES DAILY BEFORE MEALS PER SLIDING SCALE: 10 UNITS 100-200, 15 UNITS 200-300, 18 UNITS >300 CBG 30 mL 3   insulin  degludec (TRESIBA  FLEXTOUCH) 200 UNIT/ML FlexTouch Pen INJECT 32 UNITS INTO THE SKIN DAILY. 15 mL 0   Insulin  Pen Needle (ADVOCATE INSULIN  PEN NEEDLES) 31G X 5 MM MISC Use 3x a day 300 each 3   lidocaine  (LIDODERM ) 5 % Place 1 patch onto the skin daily. Remove & Discard patch within 12 hours or as directed by MD (Patient not taking: Reported on 07/15/2024)  30 patch 0   ONETOUCH ULTRA test strip USE TO check blood glucose four times daily AS DIRECTED 150 strip 3   pantoprazole  (PROTONIX ) 40 MG tablet TAKE 1 TABLET BY MOUTH EVERY DAY 90 tablet 0   simvastatin  (ZOCOR ) 20 MG tablet TAKE 1 TABLET BY MOUTH EVERYDAY AT BEDTIME 90 tablet 0   traMADol  (ULTRAM ) 50 MG tablet Take 1 tablet (50 mg total) by mouth every 6 (six) hours as needed. (Patient not taking: Reported on 07/15/2024) 15 tablet 0   TYLENOL  325 MG tablet Take 325 mg by mouth in the morning and at bedtime.     No facility-administered medications prior to visit.     Per HPI unless specifically indicated in ROS section below Review of Systems  Objective:  BP 138/72    Pulse 70   Temp 98.6 F (37 C) (Oral)   Ht 6' (1.829 m)   Wt 207 lb (93.9 kg)   SpO2 96%   BMI 28.07 kg/m   Wt Readings from Last 3 Encounters:  07/15/24 207 lb (93.9 kg)  06/17/24 203 lb (92.1 kg)  06/08/24 220 lb (99.8 kg)      Physical Exam Vitals and nursing note reviewed.  Constitutional:      Appearance: Normal appearance. He is not ill-appearing.  HENT:     Head: Normocephalic and atraumatic.     Mouth/Throat:     Mouth: Mucous membranes are moist.     Pharynx: Oropharynx is clear. No oropharyngeal exudate or posterior oropharyngeal erythema.  Eyes:     Extraocular Movements: Extraocular movements intact.     Pupils: Pupils are equal, round, and reactive to light.  Cardiovascular:     Rate and Rhythm: Normal rate and regular rhythm.     Pulses: Normal pulses.     Heart sounds: Normal heart sounds. No murmur heard. Pulmonary:     Effort: Pulmonary effort is normal. No respiratory distress.     Breath sounds: Normal breath sounds. No wheezing, rhonchi or rales.  Musculoskeletal:     Cervical back: Normal range of motion and neck supple.     Right lower leg: No edema.     Left lower leg: No edema.  Skin:    General: Skin is warm and dry.     Findings: No rash.  Neurological:     Mental Status: He is alert.  Psychiatric:        Mood and Affect: Mood normal.        Behavior: Behavior normal.       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   Orders Placed This Encounter  Procedures   Microalbumin /  creatinine urine ratio   POCT glycosylated hemoglobin (Hb A1C)    Patient Instructions  Urine test today. Schedule eye exam as you're due Ask to see Dr Watt sports medicine to discuss possible gel shots into knees.  Sugars are doing much better! Keep up the good work!  Return to see me in 3 months   Follow up plan: Return in about 3 months (around 10/15/2024) for follow up visit.  Anton Blas, MD

## 2024-07-16 LAB — MICROALBUMIN / CREATININE URINE RATIO
Creatinine,U: 145.4 mg/dL
Microalb Creat Ratio: 92.7 mg/g — ABNORMAL HIGH (ref 0.0–30.0)
Microalb, Ur: 13.5 mg/dL — ABNORMAL HIGH (ref 0.0–1.9)

## 2024-07-16 NOTE — Assessment & Plan Note (Signed)
 Chronic end stage osteoarthritis to both knees. Last saw [facility] Dr Jerri 2023.  Glycemic control has previously precluded option of surgical intervention, now with improving sugar control could reconsider (A1c 7.5% today). Recommend maintaining A1c <8% for next several months then may refer back to ortho to discuss surgery. In interim, I will touch base with Dr Watt sports medicine to see if any indication for temporizing viscosupplementation injections.  Avoid systemic steroids in brittle diabetes.

## 2024-07-16 NOTE — Assessment & Plan Note (Addendum)
 Chronic brittle diabetes. Significant improved control with family member's involvement in insulin  management. This is even off metformin .  Son notes sugars have improved since he's started giving insulin  15 min before meals, and has spaced out meals longer.  Continue current regimen. RTC 3 mo DM f/u visit.  Current regimen is: Tresiba  30-32u nightly  Novolog  flexpen 12-18u TID AC with SSI (10u 100-200, 15u 200-300, 18u >300).

## 2024-07-16 NOTE — Assessment & Plan Note (Signed)
 Rec schedule eye exam if due.

## 2024-07-16 NOTE — Telephone Encounter (Signed)
 Pt with severe end stage knee osteoarthritis with brittle diabetes. We are working towards possible ortho re-eval spring 2026, but I have asked him to see Dr Watt to discuss possible viscosupplementation temporarily. Will forward to Dr Watt to see if that's a possibility - pt has appt 07/28/2024.

## 2024-07-16 NOTE — Assessment & Plan Note (Signed)
 Completed SNAP HST - pending results.

## 2024-07-16 NOTE — Assessment & Plan Note (Signed)
 Son notes this is much better with better glycemic control. Continue donepezil  10mg  nightly.

## 2024-07-17 ENCOUNTER — Ambulatory Visit: Payer: Self-pay | Admitting: Family Medicine

## 2024-07-17 ENCOUNTER — Telehealth: Payer: Self-pay

## 2024-07-17 DIAGNOSIS — G4739 Other sleep apnea: Secondary | ICD-10-CM

## 2024-07-17 NOTE — Progress Notes (Signed)
 ANDREU DRUDGE                                          MRN: [id number]   07/17/2024   The [facility] Quality Team Specialist reviewed this patient medical record for the purposes of chart review for care gap closure. The following were reviewed: abstraction for care gap closure-kidney health evaluation for diabetes:eGFR  and uACR.    [facility]BCI Quality Team

## 2024-07-17 NOTE — Telephone Encounter (Signed)
 Summary of benefits: Plan covers 80% of allowable amount for Durolane  Deductibles do not apply  Patient has a $30 copay whether or not an office visit is billed  If out of pocket is met, coverage goes to 100% and copay will no longer apply. $8568.76 of $4150 out of pocket has been met.   No pre-cert and No referrals needed.  **Medical notes may be requested at the time of claim processing**  Forms have been placed in providers box to review

## 2024-07-17 NOTE — Telephone Encounter (Signed)
 Benefit request submitted for Durolane gel injection of both knees. Preferred gel for [facility].   Price of Durolane gel, per knee,is $2,100  Will reach out to provider when benefit coverage is provided.

## 2024-07-18 NOTE — Telephone Encounter (Signed)
 Left message for Lorn (daughter)  to return call to office.

## 2024-07-18 NOTE — Telephone Encounter (Signed)
 Out of pocket cost for Durolane injections discussed with Mandy.  She understands that the $400 to $450 cost is per knee.  He will discuss with her dad and brother.  They will call us  back to scheduled if they decide to move forward with the injections.

## 2024-07-18 NOTE — Telephone Encounter (Signed)
 Arland,   Can you let Mr. Lohse know.  His insurance requires a 20% co-pay for the visco medication.  This will be around 400-450 dollars total until he meets his out of pocked max.

## 2024-07-22 NOTE — Progress Notes (Unsigned)
 Anthony Manrique T. Charmon Thorson, MD, CAQ Sports Medicine [facility] [address] [place], [address]  Phone: [phone]  FAX: [phone]  Anthony [name] - 78 y.o. male  MRN [id number]  Date of Birth: 21-Jun-1946  Date: 07/23/2024  PCP: [name], MD  Referral: [name], MD  No chief complaint on file.   [name] is here for bilateral Durolane injections.      Anthony Mullinax T. Deyanna Mctier, MD, CAQ Sports Medicine [facility] at [facility] [address] [place], [address]  Phone: [phone]  FAX: [phone]  Anthony [name] - 78 y.o. male  MRN [id number]  Date of Birth: August 12, 1946  Date: 07/23/2024  PCP: [name], MD  Referral: [name], MD  No chief complaint on file.     ICD-10-CM   1. Primary osteoarthritis of knees, bilateral  M17.0      Aspiration/Injection Procedure Note Anthony [name] 12-30-1945 Date of procedure: 07/23/2024  Procedure: Large Joint Aspiration / Injection of Knee for Viscosupplementation, R Medication: Durolane 60 mg / 3 mL Indications: Pain J7318, # 60 units  Procedure Details Patient verbally consented to procedure. Risks, benefits, and alternatives explained. Sterilely prepped with Chloraprep. Ethyl cholride used for anesthesia, then 7 cc of Lidocaine  1% used for anesthesia in the anterolateral position. Reprepped with Chloraprep.  Anteromedial approach used to inject joint without difficulty, injected with Durolane 60 mg/3 mL. No complications with procedure and tolerated well.   Aspiration/Injection Procedure Note Anthony [name] [birth year] Date of procedure: 07/23/2024  Procedure: Large Joint Aspiration / Injection of Knee for Viscosupplementation, L Medication: Durolane 60 mg / 3 mL Indications: Pain J7318, # 60 units  Procedure Details Patient verbally consented to procedure. Risks, benefits, and alternatives explained.  Sterilely prepped with Chloraprep. Ethyl cholride used for anesthesia, then 7 cc of Lidocaine  1% used for anesthesia in the anterolateral position. Reprepped with Chloraprep.  Anteromedial approach used to inject joint without difficulty, injected with Durolane 60 mg/3 mL. No complications with procedure and tolerated well.   Medication Management during today's office visit: No orders of the defined types were placed in this encounter.  There are no discontinued medications.  Orders placed today for conditions managed today: No orders of the defined types were placed in this encounter.   Disposition: No follow-ups on file.  [employer] One speech-to-text software was used for transcription in this dictation.  Possible transcriptional errors can occur using Animal nutritionist.   Signed,  Anthony [name]. Anthony Stofko, MD   Outpatient Encounter Medications as of 07/23/2024  Medication Sig   amLODipine  (NORVASC ) 5 MG tablet TAKE 1 TABLET BY MOUTH EVERY DAY FOR BLOOD PRESSURE   aspirin  EC 81 MG tablet Take 1 tablet (81 mg total) by mouth daily. Swallow whole.   clobetasol (TEMOVATE) 0.05 % external solution Apply topically daily as needed.   Continuous Blood Gluc Sensor (DEXCOM G7 SENSOR) MISC Inject 1 Device into the skin See admin instructions. Place 1 new sensor into the skin every 10 days   diclofenac  Sodium (VOLTAREN ) 1 % GEL Apply 4 g topically 4 (four) times daily. (Patient not taking: Reported on 07/15/2024)   donepezil  (ARICEPT ) 10 MG tablet TAKE 1 TABLET BY MOUTH EVERYDAY AT BEDTIME   finasteride  (PROSCAR ) 5 MG tablet Take 1 tablet (5 mg total) by mouth daily.   glucose 4 GM chewable tablet Chew 4 g by mouth as needed for low blood sugar.   GVOKE HYPOPEN  1-PACK  1 MG/0.2ML SOAJ Inject under skin 0.2 mL as needed for hypoglycemia   insulin  aspart (NOVOLOG  FLEXPEN) 100 UNIT/ML FlexPen INJECT 12-18 UNITS INTO THE SKIN 3 TIMES DAILY BEFORE MEALS PER SLIDING SCALE: 10 UNITS 100-200, 15 UNITS  200-300, 18 UNITS >300 CBG   insulin  degludec (TRESIBA  FLEXTOUCH) 200 UNIT/ML FlexTouch Pen INJECT 32 UNITS INTO THE SKIN DAILY.   Insulin  Pen Needle (ADVOCATE INSULIN  PEN NEEDLES) 31G X 5 MM MISC Use 3x a day   lidocaine  (LIDODERM ) 5 % Place 1 patch onto the skin daily. Remove & Discard patch within 12 hours or as directed by MD (Patient not taking: Reported on 07/15/2024)   ONETOUCH ULTRA test strip USE TO check blood glucose four times daily AS DIRECTED   pantoprazole  (PROTONIX ) 40 MG tablet TAKE 1 TABLET BY MOUTH EVERY DAY   simvastatin  (ZOCOR ) 20 MG tablet TAKE 1 TABLET BY MOUTH EVERYDAY AT BEDTIME   traMADol  (ULTRAM ) 50 MG tablet Take 1 tablet (50 mg total) by mouth every 6 (six) hours as needed. (Patient not taking: Reported on 07/15/2024)   TYLENOL  325 MG tablet Take 325 mg by mouth in the morning and at bedtime.   No facility-administered encounter medications on file as of 07/23/2024.

## 2024-07-23 ENCOUNTER — Ambulatory Visit

## 2024-07-23 ENCOUNTER — Encounter: Payer: Self-pay | Admitting: Family Medicine

## 2024-07-23 ENCOUNTER — Ambulatory Visit (INDEPENDENT_AMBULATORY_CARE_PROVIDER_SITE_OTHER): Admitting: Family Medicine

## 2024-07-23 VITALS — BP 148/80 | HR 81 | Temp 98.0°F | Ht 72.0 in | Wt 206.4 lb

## 2024-07-23 DIAGNOSIS — M170 Bilateral primary osteoarthritis of knee: Secondary | ICD-10-CM

## 2024-07-23 MED ORDER — SODIUM HYALURONATE 60 MG/3ML IX PRSY
60.0000 mg | PREFILLED_SYRINGE | Freq: Once | INTRA_ARTICULAR | Status: AC
  Administered 2024-07-23: 60 mg via INTRA_ARTICULAR

## 2024-07-28 ENCOUNTER — Ambulatory Visit: Admitting: Family Medicine

## 2024-08-05 ENCOUNTER — Encounter: Payer: Self-pay | Admitting: Sleep Medicine

## 2024-08-05 ENCOUNTER — Ambulatory Visit: Admitting: Sleep Medicine

## 2024-08-05 VITALS — BP 138/84 | HR 81 | Temp 97.0°F | Ht 72.0 in | Wt 209.2 lb

## 2024-08-05 DIAGNOSIS — I10 Essential (primary) hypertension: Secondary | ICD-10-CM

## 2024-08-05 DIAGNOSIS — G4733 Obstructive sleep apnea (adult) (pediatric): Secondary | ICD-10-CM | POA: Diagnosis not present

## 2024-08-05 NOTE — Progress Notes (Signed)
 Name:Anthony [name] MRN: [id number] DOB: 07-11-1946   CHIEF COMPLAINT:  EXCESSIVE DAYTIME SLEEPINESS   HISTORY OF PRESENT ILLNESS: Anthony [name] is a 78 y.o. w/ a h/o OSA, DMII, HTN, hyperlipidemia and GERD who present for c/o loud snoring and excessive daytime sleepiness which has been present for several years. Reports nocturnal awakenings due to nocturia, however does not have difficulty falling back to sleep. Reports significant weight changes. Denies morning headaches, RLS symptoms, dream enactment, cataplexy, hypnagogic or hypnapompic hallucinations. Reports a family history of sleep apnea. Denies drowsy driving. Drinks 2 cups of 3 sodas daily, denies alcohol or illicit drug use. Former smoker.   The patient underwent HST recently which revealed severe sleep apnea (AHI 69, O2 nadir 73%).   Bedtime 10-11 pm Sleep onset 10 mins Rise time 8:30 am    EPWORTH SLEEP SCORE 20    08/05/2024    2:03 PM  Results of the Epworth flowsheet  Sitting and reading 2  Watching TV 3  Sitting, inactive in a public place (e.g. a theatre or a meeting) 2  As a passenger in a car for an hour without a break 3  Lying down to rest in the afternoon when circumstances permit 3  Sitting and talking to someone 2  Sitting quietly after a lunch without alcohol 3  In a car, while stopped for a few minutes in traffic 2  Total score 20    PAST MEDICAL HISTORY :   has a past medical history of Aortic atherosclerosis (02/28/2020), Barrett's esophagus (12/08/2015), Bladder mass, BPH with obstruction/lower urinary tract symptoms (2014), Cervical spondylosis, COPD (chronic obstructive pulmonary disease) ([name]) (02/28/2020), Coronary artery calcification seen on CAT scan (02/28/2020), Diabetes mellitus type 2 with complications ([name]), DKA (diabetic ketoacidoses) (01/2010), ED (erectile dysfunction), Ex-smoker (01/21/2020), Family history of adverse reaction to anesthesia, GERD (gastroesophageal reflux  disease), Gross hematuria, HTN (hypertension), Hyperlipidemia, Laceration of right hand without foreign body (07/03/2023), MCI (mild cognitive impairment) with memory loss, Osteoarthritis, and Rheumatic fever.  has a past surgical history that includes Vasectomy; Wisdom tooth extraction; laparoscopic appendectomy (07/05/2011); Appendectomy; Cholecystectomy; Transurethral resection of prostate (N/A, 07/28/2013); ABI (12/2013); Colonoscopy (01/2009); Esophagogastroduodenoscopy (01/2009); Meniscus repair (Right, 2018); Esophagogastroduodenoscopy (01/2018); Colonoscopy (01/2018); Inguinal hernia repair (Bilateral, 03/04/2020); Cataract extraction w/PHACO (Right, 12/01/2020); Cataract extraction w/PHACO (Left, 12/15/2020); Transurethral resection of bladder tumor (N/A, 02/05/2024); Cystoscopy with biopsy (N/A, 02/05/2024); and Cystoscopy with fulgeration (N/A, 02/05/2024). Prior to Admission medications   Medication Sig Start Date End Date Taking? Authorizing Provider  amLODipine  (NORVASC ) 5 MG tablet TAKE 1 TABLET BY MOUTH EVERY DAY FOR BLOOD PRESSURE 05/28/24  Yes [name], MD  aspirin  EC 81 MG tablet Take 1 tablet (81 mg total) by mouth daily. Swallow whole. 10/05/22  Yes Chandrasekhar, Mahesh A, MD  clobetasol (TEMOVATE) 0.05 % external solution Apply topically daily as needed. 07/02/24  Yes [provider]  Continuous Blood Gluc Sensor (DEXCOM G7 SENSOR) MISC Inject 1 Device into the skin See admin instructions. Place 1 new sensor into the skin every 10 days   Yes [provider]  diclofenac  Sodium (VOLTAREN ) 1 % GEL Apply 4 g topically 4 (four) times daily. 06/08/24  Yes Ula Prentice JONELLE, MD  donepezil  (ARICEPT ) 10 MG tablet TAKE 1 TABLET BY MOUTH EVERYDAY AT BEDTIME 06/05/24  Yes [name], MD  finasteride  (PROSCAR ) 5 MG tablet Take 1 tablet (5 mg total) by mouth daily. 03/25/24  Yes Vaillancourt, Samantha, PA-C  glucose 4 GM chewable tablet Chew  4 g by mouth as needed for low  blood sugar.   Yes [provider]  GVOKE HYPOPEN  1-PACK 1 MG/0.2ML SOAJ Inject under skin 0.2 mL as needed for hypoglycemia 10/24/21  Yes Trixie File, MD  insulin  aspart (NOVOLOG  FLEXPEN) 100 UNIT/ML FlexPen INJECT 12-18 UNITS INTO THE SKIN 3 TIMES DAILY BEFORE MEALS PER SLIDING SCALE: 10 UNITS 100-200, 15 UNITS 200-300, 18 UNITS >300 CBG 02/18/24  Yes [name], MD  insulin  degludec (TRESIBA  FLEXTOUCH) 200 UNIT/ML FlexTouch Pen INJECT 32 UNITS INTO THE SKIN DAILY. 05/26/24  Yes [name], MD  Insulin  Pen Needle (ADVOCATE INSULIN  PEN NEEDLES) 31G X 5 MM MISC Use 3x a day 03/19/17  Yes Trixie File, MD  lidocaine  (LIDODERM ) 5 % Place 1 patch onto the skin daily. Remove & Discard patch within 12 hours or as directed by MD 06/08/24  Yes Ula Prentice SAUNDERS, MD  [facility] ULTRA test strip USE TO check blood glucose four times daily AS DIRECTED 12/20/22  Yes Trixie File, MD  pantoprazole  (PROTONIX ) 40 MG tablet TAKE 1 TABLET BY MOUTH EVERY DAY 06/10/24  Yes [name], MD  simvastatin  (ZOCOR ) 20 MG tablet TAKE 1 TABLET BY MOUTH EVERYDAY AT BEDTIME 06/05/24  Yes [name], MD  traMADol  (ULTRAM ) 50 MG tablet Take 1 tablet (50 mg total) by mouth every 6 (six) hours as needed. 06/08/24  Yes Ula Prentice SAUNDERS, MD  TYLENOL  325 MG tablet Take 325 mg by mouth in the morning and at bedtime.   Yes [provider]   No Known Allergies  FAMILY HISTORY:  family history includes Alcohol abuse in his father; Cancer in his father; Coronary artery disease in his mother; Diabetes in his brother, brother, brother and another family member; Heart disease in his mother; Lymphoma in his mother; Pancreatic cancer in his maternal uncle. SOCIAL HISTORY:  reports that he quit smoking about 12 years ago. His smoking use included cigarettes. He started smoking about 62 years ago. He has a 15 pack-year smoking history. He has never used smokeless tobacco. He reports that he does not  currently use alcohol after a past usage of about 7.0 standard drinks of alcohol per week. He reports that he does not use drugs.   Review of Systems:  Gen:  Denies  fever, sweats, chills weight loss  HEENT: Denies blurred vision, double vision, ear pain, eye pain, hearing loss, nose bleeds, sore throat Cardiac:  No dizziness, chest pain or heaviness, chest tightness,edema, No JVD Resp:   No cough, -sputum production, -shortness of breath,-wheezing, -hemoptysis,  Gi: Denies swallowing difficulty, stomach pain, nausea or vomiting, diarrhea, constipation, bowel incontinence Gu:  Denies bladder incontinence, burning urine Ext:   Denies Joint pain, stiffness or swelling Skin: Denies  skin rash, easy bruising or bleeding or hives Endoc:  Denies polyuria, polydipsia , polyphagia or weight change Psych:   Denies depression, insomnia or hallucinations  Other:  All other systems negative  VITAL SIGNS: BP 138/84   Pulse 81   Temp (!) 97 F (36.1 C)   Ht 6' (1.829 m)   Wt 209 lb 3.2 oz (94.9 kg)   SpO2 96%   BMI 28.37 kg/m    Physical Examination:   General Appearance: No distress  EYES PERRLA, EOM intact.   NECK Supple, No JVD Pulmonary: normal breath sounds, No wheezing.  CardiovascularNormal S1,S2.  No m/r/g.   Abdomen: Benign, Soft, non-tender. Skin:   warm, no rashes, no ecchymosis  Extremities: normal, no cyanosis, clubbing. Neuro:without focal  findings,  speech normal  PSYCHIATRIC: Mood, affect within normal limits.   ASSESSMENT AND PLAN  OSA Reviewed HST results with patient. Starting on APAP therapy set to 4-16 cm H2O. Discussed the consequences of untreated sleep apnea. Advised not to drive drowsy for safety of patient and others. Will follow up in 3 months.    HTN Stable, on current management. Following with PCP.    Patient  satisfied with Plan of action and management. All questions answered  I spent a total of 34 minutes reviewing chart data, face-to-face  evaluation with the patient, counseling and coordination of care as detailed above.    Naliyah Neth, M.D.  Sleep Medicine [facility]

## 2024-08-05 NOTE — Patient Instructions (Signed)

## 2024-08-11 ENCOUNTER — Telehealth: Payer: Self-pay

## 2024-08-11 NOTE — Telephone Encounter (Signed)
 Copied from CRM #[id number]. Topic: General - Other >> Aug 11, 2024  9:44 AM Tonda B wrote: Reason for CRM: patient son medford is calling asking for a call back he has questions about pt [phone]

## 2024-08-12 ENCOUNTER — Telehealth: Payer: Self-pay

## 2024-08-12 DIAGNOSIS — E11649 Type 2 diabetes mellitus with hypoglycemia without coma: Secondary | ICD-10-CM

## 2024-08-12 DIAGNOSIS — G3184 Mild cognitive impairment, so stated: Secondary | ICD-10-CM

## 2024-08-12 NOTE — Telephone Encounter (Addendum)
 I'm sorry to hear this. I spoke with son.  Recommend ER evaluation due to increased agitation, aggression as he needs eval asap with those high sugar levels, ensure no infection or other cause of agitation, quickest way to stabilize him.  Son is hesitant for this.   Notes pt is angry all the time, more paranoid.  This has been building over the past 3 weeks.   CPAP commencent for severe OSA still pending. Son will work on CPAP start.  Son denies symptoms of infection such as UTI symptoms, fever, cough ,diarrhea, abd pain, skin infecti.   Son requests palliative care referral for pt at home - will place referral. Previously referred 04/2023 - not seen. New referral placed.    Will also ask them to come in at 4pm Friday for eval - please place on schedule.  ER precautions reviewed.

## 2024-08-12 NOTE — Telephone Encounter (Signed)
 Copied from CRM #[id number]. Topic: General - Other >> Aug 11, 2024  9:44 AM Tonda B wrote: Reason for CRM: patient son medford is calling asking for a call back he has questions about pt [phone] >> Aug 12, 2024 10:28 AM Franky GRADE wrote: Patient's son is calling as he was expecting a call back yesterday, he would like to discuss cognitive concerns that the patient is experience, he's been getting more aggressive and delusional recently .

## 2024-08-12 NOTE — Telephone Encounter (Signed)
Added to schedule as requested

## 2024-08-12 NOTE — Telephone Encounter (Signed)
 Called and spoke with Pt's Son, Anthony [name]. Anthony [name] Anthony [name] pt has become increasingly more aggressive, paranoid and confused in the last 2 weeks and he is not sure how to best care for him or what to do.  Anthony [name] states pt feels he has lost his freedom as he is no longer allowed to drive. States pt has becoming confused on who people are and looses his things, and then makes up stories. Pt has become hostile towards his son Anthony [name] and started to make threats against him.   Anthony [name] states his fathers blood sugar levels have been in the 400's the last 4 days as his medications have been out of inconsistent due to the threats/ aggressive behaviors.  Anthony [name] states he did not notice any signs of possible UTI in pt.  Anthony [name] is seeking guidance on what to do next and is unsure if he would be able to bring him in for appt as pt is very upset with him.   Please advise

## 2024-08-12 NOTE — Addendum Note (Signed)
 Addended by: [name] on: 08/12/2024 04:34 PM   Modules accepted: Orders

## 2024-08-15 ENCOUNTER — Ambulatory Visit: Payer: Self-pay | Admitting: Family Medicine

## 2024-08-15 ENCOUNTER — Ambulatory Visit: Admitting: Family Medicine

## 2024-08-15 ENCOUNTER — Encounter: Payer: Self-pay | Admitting: Family Medicine

## 2024-08-15 VITALS — BP 160/88 | HR 73 | Temp 98.6°F | Ht 72.0 in

## 2024-08-15 DIAGNOSIS — Z639 Problem related to primary support group, unspecified: Secondary | ICD-10-CM

## 2024-08-15 DIAGNOSIS — R413 Other amnesia: Secondary | ICD-10-CM | POA: Diagnosis not present

## 2024-08-15 DIAGNOSIS — E11649 Type 2 diabetes mellitus with hypoglycemia without coma: Secondary | ICD-10-CM

## 2024-08-15 DIAGNOSIS — Z794 Long term (current) use of insulin: Secondary | ICD-10-CM | POA: Diagnosis not present

## 2024-08-15 DIAGNOSIS — G3184 Mild cognitive impairment, so stated: Secondary | ICD-10-CM

## 2024-08-15 DIAGNOSIS — G4739 Other sleep apnea: Secondary | ICD-10-CM | POA: Diagnosis not present

## 2024-08-15 DIAGNOSIS — I10 Essential (primary) hypertension: Secondary | ICD-10-CM | POA: Diagnosis not present

## 2024-08-15 DIAGNOSIS — F03B3 Unspecified dementia, moderate, with mood disturbance: Secondary | ICD-10-CM

## 2024-08-15 DIAGNOSIS — M170 Bilateral primary osteoarthritis of knee: Secondary | ICD-10-CM

## 2024-08-15 DIAGNOSIS — Z638 Other specified problems related to primary support group: Secondary | ICD-10-CM

## 2024-08-15 LAB — POC URINALSYSI DIPSTICK (AUTOMATED)
Bilirubin, UA: NEGATIVE
Glucose, UA: NEGATIVE
Ketones, UA: NEGATIVE
Leukocytes, UA: NEGATIVE
Nitrite, UA: NEGATIVE
Protein, UA: POSITIVE — AB
Spec Grav, UA: >=1.03 — AB
Urobilinogen, UA: 0.2 U/dL
pH, UA: 5.5

## 2024-08-15 MED ORDER — MEMANTINE HCL 5 MG PO TABS: ORAL_TABLET | ORAL | 3 refills | Status: DC

## 2024-08-15 NOTE — Patient Instructions (Addendum)
 Labs today  Memory testing today shows progression of memory difficulty from previous testing  I will order brain MRI at outpatient imaging center. Call centralized scheduling at [facility] to schedule appointment: [phone]   Continue donepezil  10mg  nightly  Start second memory medicine namenda 5mg  daily with increase to twice daily after 1 week.  I have asked palliative care to come out to house for assistance   We will be in touch with lab results. In the meantime, try topical voltaren  for knees.

## 2024-08-15 NOTE — Assessment & Plan Note (Addendum)
 Deterioration noted with worse MMSE score concerning for progression of cognitive impairment to dementia.  Traditionally cognition worsens when glycemic control worsens. ?reversibility.  Increased agitation noted along with distrust - specifically towards son Medford.  Check brain MRI  Longterm on donepezil  10mg  nightly. Will add namenda  5mg  daily with increase to 5mg  bid after 1 week.  With progressive worsening MMSE, concern for ability to care for self. Letter written for family.  New palliative care referral placed for pt. Wife is already cared for by [facility] hospice.

## 2024-08-15 NOTE — Progress Notes (Signed)
 " Ph: [phone] Fax: [phone]   Patient ID: Nancyann JONELLE Caves, male    DOB: 08-Apr-1946, 78 y.o.   MRN: [id number]  This visit was conducted in person.  BP (!) 160/88   Pulse 73   Temp 98.6 F (37 C) (Oral)   Ht 6' (1.829 m)   SpO2 98%   BMI 28.37 kg/m   BP Readings from Last 3 Encounters:  08/15/24 (!) 160/88  08/05/24 138/84  07/23/24 (!) 148/80   160s/80s on retesting  CC: discuss cognitive concerns Subjective:   HPI: ESLI JERNIGAN is a 78 y.o. male presenting on 08/15/2024 for Medical Management of Chronic Issues (Pt in office with daughter Stephane and wife Arland )   Here with daughter Stephane and wife Arland. Wife with known Alz dementia.  See recent phone note.  More difficulty with adherence to medication regimen including insulin  - as he has stopped allowing son to help with this. Son was previously helping manage insulin  effectively.  Lab Results  Component Value Date   HGBA1C 7.5 (A) 07/15/2024   He states he's upset because son Medford is going into his home and moving things, selling things, also upset because of perceived mis-management of some of his properties. Upset because keys were taken away and he no longer can drive -   Geriatric Assessment: Activities of Daily Living:     Bathing- independent     Dressing- independent     Eating- independent     Toileting- independent     Transferring- independent     Continence- partially dependent  Overall Assessment: independent   Instrumental Activities of Daily Living:     Transportation- dependent    Meal/Food Preparation- independent    Shopping Errands- dependent     Housekeeping/Chores- dependent     Money Management/Finances- dependent    Medication Management- dependent     Ability to Use Telephone- independent     Laundry- dependent  Overall Assessment: dependent   Mental Status Exam: 16/30 (value/max value) Clock Drawing Score: 1/4   From recent phone note: Spoke with son Medford.  Family has noticed increased agitation, aggression and subsequent deteriorated sugar control. Notes pt is angry all the time, more paranoid.  This has been building over the past 3 weeks.  CPAP commencent for severe OSA still pending. Son will work on CPAP start.  Son denies symptoms of infection such as UTI symptoms, fever, cough, diarrhea, abd pain, skin infection.  Son requests palliative care referral for pt at home - will place referral. Previously referred 04/2023 - never completed. New referral placed.       Relevant past medical, surgical, family and social history reviewed and updated as indicated. Interim medical history since our last visit reviewed. Allergies and medications reviewed and updated. Outpatient Medications Prior to Visit  Medication Sig Dispense Refill   amLODipine  (NORVASC ) 5 MG tablet TAKE 1 TABLET BY MOUTH EVERY DAY FOR BLOOD PRESSURE 90 tablet 2   aspirin  EC 81 MG tablet Take 1 tablet (81 mg total) by mouth daily. Swallow whole. 30 tablet 12   clobetasol (TEMOVATE) 0.05 % external solution Apply topically daily as needed.     Continuous Blood Gluc Sensor (DEXCOM G7 SENSOR) MISC Inject 1 Device into the skin See admin instructions. Place 1 new sensor into the skin every 10 days     diclofenac  Sodium (VOLTAREN ) 1 % GEL Apply 4 g topically 4 (four) times daily. 100 g 0   donepezil  (ARICEPT ) 10  MG tablet TAKE 1 TABLET BY MOUTH EVERYDAY AT BEDTIME 90 tablet 1   finasteride  (PROSCAR ) 5 MG tablet Take 1 tablet (5 mg total) by mouth daily. 90 tablet 3   glucose 4 GM chewable tablet Chew 4 g by mouth as needed for low blood sugar.     GVOKE HYPOPEN  1-PACK 1 MG/0.2ML SOAJ Inject under skin 0.2 mL as needed for hypoglycemia 0.2 mL 11   insulin  aspart (NOVOLOG  FLEXPEN) 100 UNIT/ML FlexPen INJECT 12-18 UNITS INTO THE SKIN 3 TIMES DAILY BEFORE MEALS PER SLIDING SCALE: 10 UNITS 100-200, 15 UNITS 200-300, 18 UNITS >300 CBG 30 mL 3   insulin  degludec (TRESIBA  FLEXTOUCH) 200 UNIT/ML  FlexTouch Pen INJECT 32 UNITS INTO THE SKIN DAILY. 15 mL 0   Insulin  Pen Needle (ADVOCATE INSULIN  PEN NEEDLES) 31G X 5 MM MISC Use 3x a day 300 each 3   lidocaine  (LIDODERM ) 5 % Place 1 patch onto the skin daily. Remove & Discard patch within 12 hours or as directed by MD 30 patch 0   ONETOUCH ULTRA test strip USE TO check blood glucose four times daily AS DIRECTED 150 strip 3   pantoprazole  (PROTONIX ) 40 MG tablet TAKE 1 TABLET BY MOUTH EVERY DAY 90 tablet 0   simvastatin  (ZOCOR ) 20 MG tablet TAKE 1 TABLET BY MOUTH EVERYDAY AT BEDTIME 90 tablet 0   traMADol  (ULTRAM ) 50 MG tablet Take 1 tablet (50 mg total) by mouth every 6 (six) hours as needed. 15 tablet 0   TYLENOL  325 MG tablet Take 325 mg by mouth in the morning and at bedtime.     No facility-administered medications prior to visit.     Per HPI unless specifically indicated in ROS section below Review of Systems  Objective:  BP (!) 160/88   Pulse 73   Temp 98.6 F (37 C) (Oral)   Ht 6' (1.829 m)   SpO2 98%   BMI 28.37 kg/m   Wt Readings from Last 3 Encounters:  08/05/24 209 lb 3.2 oz (94.9 kg)  07/23/24 206 lb 6 oz (93.6 kg)  07/15/24 207 lb (93.9 kg)      Physical Exam Vitals and nursing note reviewed.  Constitutional:      Appearance: Normal appearance. He is not ill-appearing.  HENT:     Mouth/Throat:     Mouth: Mucous membranes are moist.     Pharynx: No oropharyngeal exudate or posterior oropharyngeal erythema.  Eyes:     Extraocular Movements: Extraocular movements intact.     Conjunctiva/sclera: Conjunctivae normal.     Pupils: Pupils are equal, round, and reactive to light.  Cardiovascular:     Rate and Rhythm: Normal rate and regular rhythm.     Pulses: Normal pulses.     Heart sounds: Normal heart sounds.  Pulmonary:     Effort: Pulmonary effort is normal. No respiratory distress.     Breath sounds: Normal breath sounds. No wheezing, rhonchi or rales.  Musculoskeletal:        General: Tenderness  present.     Comments: Antalgic slowed gait due to know knee arthritis   Neurological:     Mental Status: He is alert.  Psychiatric:        Attention and Perception: Attention normal.        Mood and Affect: Affect is angry.        Speech: Speech normal.        Behavior: Behavior normal. Behavior is cooperative.       Results  for orders placed or performed in visit on 08/15/24  POCT Urinalysis Dipstick (Automated)   Collection Time: 08/15/24  4:21 PM  Result Value Ref Range   Color, UA straw    Clarity, UA clear    Glucose, UA Negative Negative   Bilirubin, UA neg    Ketones, UA neg    Spec Grav, UA >=1.030 (A) 1.010 - 1.025   Blood, UA trace    pH, UA 5.5 5.0 - 8.0   Protein, UA Positive (A) Negative   Urobilinogen, UA 0.2 0.2 or 1.0 E.U./dL   Nitrite, UA neg    Leukocytes, UA Negative Negative  CBC with Differential/Platelet   Collection Time: 08/15/24  4:29 PM  Result Value Ref Range   WBC 8.7 3.8 - 10.8 Thousand/uL   RBC 5.03 4.20 - 5.80 Million/uL   Hemoglobin 14.4 13.2 - 17.1 g/dL   HCT 56.5 60.5 - 48.8 %   MCV 86.3 81.4 - 101.7 fL   MCH 28.6 27.0 - 33.0 pg   MCHC 33.2 31.6 - 35.4 g/dL   RDW 87.0 88.9 - 84.9 %   Platelets 243 140 - 400 Thousand/uL   MPV 10.7 7.5 - 12.5 fL   Neutro Abs 5,768 1,500 - 7,800 cells/uL   Absolute Lymphocytes 2,062 850 - 3,900 cells/uL   Absolute Monocytes 557 200 - 950 cells/uL   Eosinophils Absolute 252 15 - 500 cells/uL   Basophils Absolute 61 0 - 200 cells/uL   Neutrophils Relative % 66.3 %   Total Lymphocyte 23.7 %   Monocytes Relative 6.4 %   Eosinophils Relative 2.9 %   Basophils Relative 0.7 %  Comprehensive metabolic panel with GFR   Collection Time: 08/15/24  4:29 PM  Result Value Ref Range   Glucose, Bld 63 (L) 65 - 99 mg/dL   BUN 22 7 - 25 mg/dL   Creat 8.83 9.29 - 8.71 mg/dL   eGFR 65 > OR = 60 fO/fpw/8.26f7   BUN/Creatinine Ratio SEE NOTE: 6 - 22 (calc)   Sodium 143 135 - 146 mmol/L   Potassium 3.7 3.5 - 5.3  mmol/L   Chloride 107 98 - 110 mmol/L   CO2 29 20 - 32 mmol/L   Calcium 9.4 8.6 - 10.3 mg/dL   Total Protein 6.4 6.1 - 8.1 g/dL   Albumin 4.1 3.6 - 5.1 g/dL   Globulin 2.3 1.9 - 3.7 g/dL (calc)   AG Ratio 1.8 1.0 - 2.5 (calc)   Total Bilirubin 0.5 0.2 - 1.2 mg/dL   Alkaline phosphatase (APISO) 88 35 - 144 U/L   AST 16 10 - 35 U/L   ALT 17 9 - 46 U/L  Vitamin B12   Collection Time: 08/15/24  4:29 PM  Result Value Ref Range   Vitamin B-12 416 200 - 1,100 pg/mL     Lab Results  Component Value Date   TSH 1.74 06/17/2024    Assessment & Plan:   Problem List Items Addressed This Visit     Hypertension   Chronic, deteriorated, likely agitation in office contributing.  Encouraged continue antihypertensives as prescribed - amlodipine  5mg  daily. Consider adding ARB - previously olmesartan /hydrochlorothiazide  stopped after episode of AKI earlier this year.       MCI (mild cognitive impairment) with memory loss - Primary   Deterioration noted with worse MMSE score concerning for progression of cognitive impairment to dementia.  Traditionally cognition worsens when glycemic control worsens. ?reversibility.  Increased agitation noted along with distrust - specifically towards son  Chris.  Check brain MRI  Longterm on donepezil  10mg  nightly. Will add namenda  5mg  daily with increase to 5mg  bid after 1 week.  With progressive worsening MMSE, concern for ability to care for self. Letter written for family.  New palliative care referral placed for pt. Wife is already cared for by [facility] hosp[facility].       Relevant Orders   MR Brain Wo Contrast   Type 2 diabetes mellitus with hypoglycemia ([name])   Chronic brittle diabetes. Previously best control to date when son Manley was assisting with insulin  management - however now patient is upset at son and not letting him manage insulin  with resultant high sugar levels to 400s at home.  Update labs.       Primary osteoarthritis of both knees    Chronic end stage osteoarthritis of both knees. Saw [facility] 2023, was recommended against surgical intervention due to poor glycemic control.  Latest received bilateral viscosupplementation (Durolane) injections 07/23/2024 through Dr Watt.  Avoiding NSAIDs in h/o AKI, avoiding systemic steroids in brittle diabetes, avoiding opiate in cognitive impairment.  Rec regular topical voltaren  gel use.       Caregiver role strain   He has traditionally cared for his wife with dementia along with strong family support.  Now he and wife are becoming more agitated towards family specifically son Medford. See below.       Mixed sleep apnea   They are working towards CPAP commencement. This can also affect cognition.       Family tension   Encouraged all family members be consistent with care plan for patient.  Encouraged patient give family benefit of the doubt.  New palliative care referral placed for pt. Wife is already cared for by [facility] hospice.       Other Visit Diagnoses       Memory changes       Relevant Orders   POCT Urinalysis Dipstick (Automated) (Completed)   CBC with Differential/Platelet (Completed)   Comprehensive metabolic panel with GFR (Completed)   Vitamin B12 (Completed)   MR Brain Wo Contrast        Meds ordered this encounter  Medications   memantine  (NAMENDA ) 5 MG tablet    Sig: Take 1 tablet (5 mg total) by mouth daily for 7 days, THEN 1 tablet (5 mg total) 2 (two) times daily.    Dispense:  60 tablet    Refill:  3    Orders Placed This Encounter  Procedures   MR Brain Wo Contrast    Standing Status:   Future    Expiration Date:   08/23/2025    What is the patient's sedation requirement?:   No Sedation    Does the patient have a pacemaker or implanted devices?:   No    Preferred imaging location?:   [facility] (table limit-350lbs)   CBC with Differential/Platelet   Comprehensive metabolic panel with GFR   Vitamin B12   POCT  Urinalysis Dipstick (Automated)    Patient Instructions  Labs today  Memory testing today shows progression of memory difficulty from previous testing  I will order brain MRI at outpatient imaging center. Call centralized scheduling at [place] to schedule appointment: [phone]   Continue donepezil  10mg  nightly  Start second memory medicine namenda  5mg  daily with increase to twice daily after 1 week.  I have asked palliative care to come out to house for assistance   We will be in touch with lab results. In the meantime,  try topical voltaren  for knees.   Follow up plan: No follow-ups on file.  Anton Blas, MD   "

## 2024-08-16 LAB — COMPREHENSIVE METABOLIC PANEL WITH GFR
AG Ratio: 1.8 (calc) (ref 1.0–2.5)
ALT: 17 U/L (ref 9–46)
AST: 16 U/L (ref 10–35)
Albumin: 4.1 g/dL (ref 3.6–5.1)
Alkaline phosphatase (APISO): 88 U/L (ref 35–144)
BUN: 22 mg/dL (ref 7–25)
CO2: 29 mmol/L (ref 20–32)
Calcium: 9.4 mg/dL (ref 8.6–10.3)
Chloride: 107 mmol/L (ref 98–110)
Creat: 1.16 mg/dL (ref 0.70–1.28)
Globulin: 2.3 g/dL (ref 1.9–3.7)
Glucose, Bld: 63 mg/dL — ABNORMAL LOW (ref 65–99)
Potassium: 3.7 mmol/L (ref 3.5–5.3)
Sodium: 143 mmol/L (ref 135–146)
Total Bilirubin: 0.5 mg/dL (ref 0.2–1.2)
Total Protein: 6.4 g/dL (ref 6.1–8.1)
eGFR: 65 mL/min/1.73m2

## 2024-08-16 LAB — CBC WITH DIFFERENTIAL/PLATELET
Absolute Lymphocytes: 2062 {cells}/uL (ref 850–3900)
Absolute Monocytes: 557 {cells}/uL (ref 200–950)
Basophils Absolute: 61 {cells}/uL (ref 0–200)
Basophils Relative: 0.7 %
Eosinophils Absolute: 252 {cells}/uL (ref 15–500)
Eosinophils Relative: 2.9 %
HCT: 43.4 % (ref 39.4–51.1)
Hemoglobin: 14.4 g/dL (ref 13.2–17.1)
MCH: 28.6 pg (ref 27.0–33.0)
MCHC: 33.2 g/dL (ref 31.6–35.4)
MCV: 86.3 fL (ref 81.4–101.7)
MPV: 10.7 fL (ref 7.5–12.5)
Monocytes Relative: 6.4 %
Neutro Abs: 5768 {cells}/uL (ref 1500–7800)
Neutrophils Relative %: 66.3 %
Platelets: 243 Thousand/uL (ref 140–400)
RBC: 5.03 Million/uL (ref 4.20–5.80)
RDW: 12.9 % (ref 11.0–15.0)
Total Lymphocyte: 23.7 %
WBC: 8.7 Thousand/uL (ref 3.8–10.8)

## 2024-08-16 LAB — VITAMIN B12: Vitamin B-12: 416 pg/mL (ref 200–1100)

## 2024-08-22 ENCOUNTER — Telehealth: Payer: Self-pay

## 2024-08-22 NOTE — Telephone Encounter (Signed)
 Copied from CRM #[id number]. Topic: General - Other >> Aug 22, 2024 12:50 PM Mercedes MATSU wrote: Reason for CRM: Patients son in law Dejaun Vidrio called on behalf of the patient and states that her father in law was trying to get scheduled fort an MRI, but there was not a order in the chart. Patient is requesting for a mri to be added to the patients chart as discussed prior. Patients son can also be reached at [phone].

## 2024-08-23 DIAGNOSIS — Z639 Problem related to primary support group, unspecified: Secondary | ICD-10-CM | POA: Insufficient documentation

## 2024-08-23 NOTE — Assessment & Plan Note (Addendum)
 He has traditionally cared for his wife with dementia along with strong family support.  Now he and wife are becoming more agitated towards family specifically son Medford. See below.

## 2024-08-23 NOTE — Assessment & Plan Note (Signed)
 Chronic brittle diabetes. Previously best control to date when son Manley was assisting with insulin  management - however now patient is upset at son and not letting him manage insulin  with resultant high sugar levels to 400s at home.  Update labs.

## 2024-08-23 NOTE — Assessment & Plan Note (Addendum)
 Chronic end stage osteoarthritis of both knees. Saw [facility] 2023, was recommended against surgical intervention due to poor glycemic control.  Latest received bilateral viscosupplementation (Durolane) injections 07/23/2024 through Dr Watt.  Avoiding NSAIDs in h/o AKI, avoiding systemic steroids in brittle diabetes, avoiding opiate in cognitive impairment.  Rec regular topical voltaren  gel use.

## 2024-08-23 NOTE — Assessment & Plan Note (Signed)
 Chronic, deteriorated, likely agitation in office contributing.  Encouraged continue antihypertensives as prescribed - amlodipine  5mg  daily. Consider adding ARB - previously olmesartan /hydrochlorothiazide  stopped after episode of AKI earlier this year.

## 2024-08-23 NOTE — Assessment & Plan Note (Addendum)
 They are working towards CPAP commencement. This can also affect cognition.

## 2024-08-23 NOTE — Assessment & Plan Note (Signed)
 Encouraged all family members be consistent with care plan for patient.  Encouraged patient give family benefit of the doubt.  New palliative care referral placed for pt. Wife is already cared for by [facility] hospice.

## 2024-08-25 ENCOUNTER — Telehealth: Payer: Self-pay | Admitting: Family Medicine

## 2024-08-25 NOTE — Telephone Encounter (Signed)
 Copied from CRM #[id number]. Topic: Referral - Status >> Aug 25, 2024  2:08 PM Eva FALCON wrote: Reason for CRM: Pt son in law Nakhi Choi was calling asking about information regarding the MRI referral, states he never got a call back. Is wanting to know where he needs to take his father in law and phone number. Please call son in law Detravion [phone].

## 2024-08-25 NOTE — Telephone Encounter (Addendum)
 Plz notify daughter Stephane or son Medford that MRI was ordered so they may call and schedule. Also, I have prepared a letter regarding pt's memory - family can pick up at their convenience. Letter placed in CMA box.

## 2024-08-25 NOTE — Telephone Encounter (Signed)
 Called patient's daughter Arland and reviewed all information and repeated back to me. States she will call asap to get scheduled and will swing by office when able. Will call if any questions or concerns arise.

## 2024-08-26 NOTE — Telephone Encounter (Signed)
 Wife of son in law (pt's daughter) has ben updated.

## 2024-08-27 NOTE — Telephone Encounter (Signed)
 Son dropped of POW and MyChart acces form placed in Dr KANDICE box at front desk for review

## 2024-08-29 NOTE — Telephone Encounter (Signed)
 Daughter Stephane came in to pick up letter

## 2024-09-07 ENCOUNTER — Other Ambulatory Visit: Payer: Self-pay | Admitting: Family Medicine

## 2024-09-07 DIAGNOSIS — K219 Gastro-esophageal reflux disease without esophagitis: Secondary | ICD-10-CM

## 2024-09-09 ENCOUNTER — Telehealth: Payer: Self-pay | Admitting: Family Medicine

## 2024-09-09 DIAGNOSIS — G3184 Mild cognitive impairment, so stated: Secondary | ICD-10-CM

## 2024-09-09 NOTE — Telephone Encounter (Signed)
 Copied from CRM [id number]. Topic: General - Other >> Sep 09, 2024 11:47 AM Deaijah H wrote: Reason for CRM: Patients son Anthony [name] called in wanting to speak with someone to be placed back onto [facility], due to being POA and Dr. Rilla leaving a note regarding situation per CAL he would need to come with patient to do so, advised Son Anthony [name] he then stated patient has Paranoia delusion, but would like to speak with someone regarding so. Please call [phone]

## 2024-09-10 ENCOUNTER — Ambulatory Visit
Admission: RE | Admit: 2024-09-10 | Discharge: 2024-09-10 | Disposition: A | Discharge status: Home / Self Care | Source: Ambulatory Visit | Attending: Family Medicine | Admitting: Family Medicine

## 2024-09-10 DIAGNOSIS — R413 Other amnesia: Secondary | ICD-10-CM | POA: Insufficient documentation

## 2024-09-10 DIAGNOSIS — G3184 Mild cognitive impairment, so stated: Secondary | ICD-10-CM | POA: Diagnosis present

## 2024-09-10 MED ORDER — SERTRALINE HCL 25 MG PO TABS: 25.0000 mg | ORAL_TABLET | Freq: Every day | ORAL | 3 refills | Status: AC

## 2024-09-10 NOTE — Telephone Encounter (Addendum)
 I just received this today, have signed and placed back in CMA box.  See latest phone note 09/09/2024.

## 2024-09-10 NOTE — Addendum Note (Signed)
 Addended by: [name] on: 09/10/2024 05:53 PM   Modules accepted: Orders

## 2024-09-10 NOTE — Telephone Encounter (Signed)
 Spoke with son Medford [phone].  Pt continues with paranoia regarding son Medford.  Son requests I speak with Dawn when MRI results return (done today).   Sister Stephane has been staying with him.   Namenda  5mg  worsened agitation especially at BID dose. Will stop.  Will start sertraline  25mg  daily in its place.  Keep f/u next month.  Consider trial depakote for paranoia.

## 2024-09-16 NOTE — Addendum Note (Signed)
 Addended by: [name] on: 09/16/2024 09:44 AM   Modules accepted: Orders

## 2024-09-30 ENCOUNTER — Encounter: Payer: Self-pay | Admitting: *Deleted

## 2024-10-01 ENCOUNTER — Ambulatory Visit: Admitting: Podiatry

## 2024-10-15 ENCOUNTER — Ambulatory Visit: Admitting: Family Medicine

## 2024-11-03 ENCOUNTER — Ambulatory Visit: Admitting: Sleep Medicine

## 2024-11-19 ENCOUNTER — Ambulatory Visit: Admitting: Podiatry

## 2025-03-06 ENCOUNTER — Ambulatory Visit: Admitting: Urology
